# Patient Record
Sex: Male | Born: 1950 | Race: White | Hispanic: No | Marital: Married | Smoking: Former smoker
Health system: Southern US, Community
[De-identification: ages and names within clinical notes are randomized; demographics above are authoritative.]

## PROBLEM LIST (undated history)

## (undated) DIAGNOSIS — E119 Type 2 diabetes mellitus without complications: Secondary | ICD-10-CM

## (undated) DIAGNOSIS — M1990 Unspecified osteoarthritis, unspecified site: Secondary | ICD-10-CM

## (undated) DIAGNOSIS — N179 Acute kidney failure, unspecified: Secondary | ICD-10-CM

## (undated) DIAGNOSIS — I4892 Unspecified atrial flutter: Secondary | ICD-10-CM

## (undated) DIAGNOSIS — G8929 Other chronic pain: Secondary | ICD-10-CM

## (undated) DIAGNOSIS — I209 Angina pectoris, unspecified: Secondary | ICD-10-CM

## (undated) DIAGNOSIS — I82409 Acute embolism and thrombosis of unspecified deep veins of unspecified lower extremity: Secondary | ICD-10-CM

## (undated) DIAGNOSIS — E7800 Pure hypercholesterolemia, unspecified: Secondary | ICD-10-CM

## (undated) DIAGNOSIS — I219 Acute myocardial infarction, unspecified: Secondary | ICD-10-CM

## (undated) DIAGNOSIS — I2510 Atherosclerotic heart disease of native coronary artery without angina pectoris: Secondary | ICD-10-CM

## (undated) DIAGNOSIS — M545 Low back pain, unspecified: Secondary | ICD-10-CM

## (undated) DIAGNOSIS — C801 Malignant (primary) neoplasm, unspecified: Secondary | ICD-10-CM

## (undated) DIAGNOSIS — I10 Essential (primary) hypertension: Secondary | ICD-10-CM

## (undated) DIAGNOSIS — I509 Heart failure, unspecified: Secondary | ICD-10-CM

## (undated) DIAGNOSIS — I739 Peripheral vascular disease, unspecified: Secondary | ICD-10-CM

## (undated) DIAGNOSIS — D649 Anemia, unspecified: Secondary | ICD-10-CM

## (undated) DIAGNOSIS — E669 Obesity, unspecified: Secondary | ICD-10-CM

## (undated) DIAGNOSIS — R0600 Dyspnea, unspecified: Secondary | ICD-10-CM

## (undated) DIAGNOSIS — Z794 Long term (current) use of insulin: Secondary | ICD-10-CM

## (undated) DIAGNOSIS — N529 Male erectile dysfunction, unspecified: Secondary | ICD-10-CM

## (undated) DIAGNOSIS — K429 Umbilical hernia without obstruction or gangrene: Secondary | ICD-10-CM

## (undated) DIAGNOSIS — I4891 Unspecified atrial fibrillation: Secondary | ICD-10-CM

## (undated) HISTORY — DX: Heart failure, unspecified: I50.9

## (undated) HISTORY — DX: Pure hypercholesterolemia, unspecified: E78.00

## (undated) HISTORY — DX: Acute kidney failure, unspecified: N17.9

## (undated) HISTORY — DX: Acute embolism and thrombosis of unspecified deep veins of unspecified lower extremity: I82.409

## (undated) HISTORY — DX: Male erectile dysfunction, unspecified: N52.9

## (undated) HISTORY — DX: Obesity, unspecified: E66.9

## (undated) HISTORY — PX: CORONARY ARTERY BYPASS GRAFT: SHX141

## (undated) HISTORY — DX: Unspecified atrial fibrillation: I48.91

## (undated) NOTE — *Deleted (*Deleted)
Pharmacy Resident Rounding Note - for learning purposes only, not an active part of the chart  S/o   Admit Complaint: 11/5 L-sided weakness, R PCA occlusion >> revasc -intubated for procedure, extubated 11/8, following commands but hard to assess mental status    H/o gastric cancer, anemia   Anticoagulation eliquis >> hep gtt for afib; Hgb 9-10, PLT WNL Infectious Disease - COVID + w/ PNA, WBC 20>>25.6 S/p remd Cardiovascular -   amio 200 qd, amlo 10, rosuva 40, hctz 25  Endocrinology - CBGS 240-300  Lantus 22 >>25u BID>>30u BID , mSSI -on methylpred 50 >> tapering over 3 more days  -metformin 1g BID @ home  Gastrointestinal / Nutrition -protonix 40, TF, last BM 11/10 Neurology  -gaba 300 BID @ home. 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

---

## 2001-12-08 ENCOUNTER — Encounter: Payer: Self-pay | Admitting: Emergency Medicine

## 2001-12-08 ENCOUNTER — Inpatient Hospital Stay (HOSPITAL_COMMUNITY): Admission: EM | Admit: 2001-12-08 | Discharge: 2001-12-13 | Payer: Self-pay | Admitting: Emergency Medicine

## 2001-12-11 ENCOUNTER — Encounter: Payer: Self-pay | Admitting: Interventional Cardiology

## 2005-07-09 ENCOUNTER — Inpatient Hospital Stay (HOSPITAL_COMMUNITY): Admission: EM | Admit: 2005-07-09 | Discharge: 2005-07-22 | Payer: Self-pay | Admitting: Emergency Medicine

## 2005-08-07 ENCOUNTER — Ambulatory Visit: Payer: Self-pay | Admitting: Cardiology

## 2005-08-07 ENCOUNTER — Encounter: Admission: RE | Admit: 2005-08-07 | Discharge: 2005-08-07 | Payer: Self-pay | Admitting: Cardiothoracic Surgery

## 2011-08-27 ENCOUNTER — Other Ambulatory Visit: Payer: Self-pay | Admitting: Interventional Cardiology

## 2011-08-31 ENCOUNTER — Ambulatory Visit (HOSPITAL_COMMUNITY)
Admission: RE | Admit: 2011-08-31 | Discharge: 2011-09-01 | Disposition: A | Discharge status: Home / Self Care | Payer: 59 | Source: Ambulatory Visit | Attending: Interventional Cardiology | Admitting: Interventional Cardiology

## 2011-08-31 ENCOUNTER — Other Ambulatory Visit: Payer: Self-pay

## 2011-08-31 ENCOUNTER — Encounter (HOSPITAL_COMMUNITY): Payer: Self-pay | Admitting: General Practice

## 2011-08-31 ENCOUNTER — Encounter (HOSPITAL_COMMUNITY)
Admission: RE | Disposition: A | Discharge status: Home / Self Care | Payer: Self-pay | Source: Ambulatory Visit | Attending: Interventional Cardiology

## 2011-08-31 DIAGNOSIS — I25810 Atherosclerosis of coronary artery bypass graft(s) without angina pectoris: Secondary | ICD-10-CM | POA: Insufficient documentation

## 2011-08-31 DIAGNOSIS — I2582 Chronic total occlusion of coronary artery: Secondary | ICD-10-CM | POA: Insufficient documentation

## 2011-08-31 DIAGNOSIS — I10 Essential (primary) hypertension: Secondary | ICD-10-CM

## 2011-08-31 DIAGNOSIS — I209 Angina pectoris, unspecified: Secondary | ICD-10-CM | POA: Insufficient documentation

## 2011-08-31 DIAGNOSIS — E785 Hyperlipidemia, unspecified: Secondary | ICD-10-CM | POA: Insufficient documentation

## 2011-08-31 DIAGNOSIS — Z951 Presence of aortocoronary bypass graft: Secondary | ICD-10-CM | POA: Diagnosis present

## 2011-08-31 DIAGNOSIS — E119 Type 2 diabetes mellitus without complications: Secondary | ICD-10-CM

## 2011-08-31 DIAGNOSIS — I2510 Atherosclerotic heart disease of native coronary artery without angina pectoris: Secondary | ICD-10-CM | POA: Insufficient documentation

## 2011-08-31 DIAGNOSIS — R9439 Abnormal result of other cardiovascular function study: Secondary | ICD-10-CM | POA: Insufficient documentation

## 2011-08-31 HISTORY — DX: Low back pain, unspecified: M54.50

## 2011-08-31 HISTORY — DX: Type 2 diabetes mellitus without complications: E11.9

## 2011-08-31 HISTORY — DX: Umbilical hernia without obstruction or gangrene: K42.9

## 2011-08-31 HISTORY — DX: Peripheral vascular disease, unspecified: I73.9

## 2011-08-31 HISTORY — DX: Other chronic pain: G89.29

## 2011-08-31 HISTORY — PX: LEFT HEART CATHETERIZATION WITH CORONARY/GRAFT ANGIOGRAM: SHX5450

## 2011-08-31 HISTORY — DX: Low back pain: M54.5

## 2011-08-31 HISTORY — DX: Type 2 diabetes mellitus without complications: Z79.4

## 2011-08-31 HISTORY — DX: Essential (primary) hypertension: I10

## 2011-08-31 HISTORY — PX: CORONARY ANGIOPLASTY WITH STENT PLACEMENT: SHX49

## 2011-08-31 HISTORY — DX: Acute myocardial infarction, unspecified: I21.9

## 2011-08-31 HISTORY — DX: Angina pectoris, unspecified: I20.9

## 2011-08-31 LAB — POCT ACTIVATED CLOTTING TIME: Activated Clotting Time: 380 seconds

## 2011-08-31 LAB — GLUCOSE, CAPILLARY
Glucose-Capillary: 127 mg/dL — ABNORMAL HIGH (ref 70–99)
Glucose-Capillary: 105 mg/dL — ABNORMAL HIGH (ref 70–99)
Glucose-Capillary: 198 mg/dL — ABNORMAL HIGH (ref 70–99)
Glucose-Capillary: 210 mg/dL — ABNORMAL HIGH (ref 70–99)

## 2011-08-31 SURGERY — LEFT HEART CATHETERIZATION WITH CORONARY/GRAFT ANGIOGRAM
Anesthesia: LOCAL

## 2011-08-31 MED ORDER — METFORMIN HCL ER 750 MG PO TB24
2000.0000 mg | ORAL_TABLET | Freq: Every day | ORAL | Status: DC
  Filled 2011-08-31: qty 1

## 2011-08-31 MED ORDER — DIAZEPAM 5 MG PO TABS
ORAL_TABLET | ORAL | Status: AC
  Filled 2011-08-31: qty 1

## 2011-08-31 MED ORDER — ACETAMINOPHEN 325 MG PO TABS: 650.0000 mg | ORAL_TABLET | ORAL | Status: DC | PRN

## 2011-08-31 MED ORDER — SIMVASTATIN 40 MG PO TABS
40.0000 mg | ORAL_TABLET | Freq: Every day | ORAL | Status: DC
  Administered 2011-08-31: 40 mg via ORAL
  Filled 2011-08-31 (×2): qty 1

## 2011-08-31 MED ORDER — SODIUM CHLORIDE 0.9 % IJ SOLN
3.0000 mL | Freq: Two times a day (BID) | INTRAMUSCULAR | Status: DC
  Administered 2011-08-31: 3 mL via INTRAVENOUS

## 2011-08-31 MED ORDER — DIAZEPAM 5 MG PO TABS
5.0000 mg | ORAL_TABLET | ORAL | Status: AC
  Administered 2011-08-31: 5 mg via ORAL

## 2011-08-31 MED ORDER — SODIUM CHLORIDE 0.9 % IV SOLN: 250.0000 mL | INTRAVENOUS | Status: DC | PRN

## 2011-08-31 MED ORDER — ASPIRIN 81 MG PO CHEW
324.0000 mg | CHEWABLE_TABLET | ORAL | Status: AC
  Administered 2011-08-31: 324 mg via ORAL

## 2011-08-31 MED ORDER — INSULIN ASPART 100 UNIT/ML ~~LOC~~ SOLN
0.0000 [IU] | Freq: Every day | SUBCUTANEOUS | Status: DC
  Administered 2011-08-31: 2 [IU] via SUBCUTANEOUS

## 2011-08-31 MED ORDER — INSULIN GLARGINE 100 UNIT/ML ~~LOC~~ SOLN
60.0000 [IU] | Freq: Every day | SUBCUTANEOUS | Status: DC
  Administered 2011-09-01: 60 [IU] via SUBCUTANEOUS

## 2011-08-31 MED ORDER — PRASUGREL HCL 10 MG PO TABS
10.0000 mg | ORAL_TABLET | Freq: Every day | ORAL | Status: DC
  Administered 2011-09-01: 10 mg via ORAL
  Filled 2011-08-31: qty 1

## 2011-08-31 MED ORDER — CARVEDILOL 6.25 MG PO TABS
6.2500 mg | ORAL_TABLET | Freq: Two times a day (BID) | ORAL | Status: DC
  Administered 2011-08-31 – 2011-09-01 (×2): 6.25 mg via ORAL
  Filled 2011-08-31 (×4): qty 1

## 2011-08-31 MED ORDER — PRASUGREL HCL 10 MG PO TABS
ORAL_TABLET | ORAL | Status: AC
  Filled 2011-08-31: qty 6

## 2011-08-31 MED ORDER — ASPIRIN EC 325 MG PO TBEC
325.0000 mg | DELAYED_RELEASE_TABLET | Freq: Every day | ORAL | Status: DC
  Administered 2011-09-01: 325 mg via ORAL
  Filled 2011-08-31: qty 1

## 2011-08-31 MED ORDER — DIAZEPAM 5 MG PO TABS: 5.0000 mg | ORAL_TABLET | Freq: Three times a day (TID) | ORAL | Status: DC | PRN

## 2011-08-31 MED ORDER — INSULIN ASPART 100 UNIT/ML ~~LOC~~ SOLN
0.0000 [IU] | Freq: Three times a day (TID) | SUBCUTANEOUS | Status: DC
  Administered 2011-09-01: 5 [IU] via SUBCUTANEOUS

## 2011-08-31 MED ORDER — ONDANSETRON HCL 4 MG/2ML IJ SOLN: 4.0000 mg | Freq: Four times a day (QID) | INTRAMUSCULAR | Status: DC | PRN

## 2011-08-31 MED ORDER — FENTANYL CITRATE 0.05 MG/ML IJ SOLN
INTRAMUSCULAR | Status: AC
  Filled 2011-08-31: qty 2

## 2011-08-31 MED ORDER — SODIUM CHLORIDE 0.9 % IJ SOLN: 3.0000 mL | INTRAMUSCULAR | Status: DC | PRN

## 2011-08-31 MED ORDER — SODIUM CHLORIDE 0.9 % IV SOLN
INTRAVENOUS | Status: DC
  Administered 2011-08-31: 09:00:00 via INTRAVENOUS

## 2011-08-31 MED ORDER — MIDAZOLAM HCL 2 MG/2ML IJ SOLN
INTRAMUSCULAR | Status: AC
  Filled 2011-08-31: qty 2

## 2011-08-31 MED ORDER — OXYCODONE-ACETAMINOPHEN 5-325 MG PO TABS: 1.0000 | ORAL_TABLET | ORAL | Status: DC | PRN

## 2011-08-31 MED ORDER — SODIUM CHLORIDE 0.9 % IV SOLN
INTRAVENOUS | Status: AC
  Administered 2011-08-31: 15:00:00 via INTRAVENOUS

## 2011-08-31 MED ORDER — ZOLPIDEM TARTRATE 5 MG PO TABS: 10.0000 mg | ORAL_TABLET | Freq: Every evening | ORAL | Status: DC | PRN

## 2011-08-31 MED ORDER — BIVALIRUDIN 250 MG IV SOLR
INTRAVENOUS | Status: AC
  Filled 2011-08-31: qty 250

## 2011-08-31 MED ORDER — INSULIN ASPART 100 UNIT/ML ~~LOC~~ SOLN: 0.0000 [IU] | Freq: Every day | SUBCUTANEOUS | Status: DC

## 2011-08-31 MED ORDER — ASPIRIN 81 MG PO CHEW
CHEWABLE_TABLET | ORAL | Status: AC
  Filled 2011-08-31: qty 4

## 2011-08-31 NOTE — H&P (Signed)
Please see our dictated note from the office. He has continued to have angina since his markedly abnormal nuclear study performed last week. He had a large region of the inferior and lateral ischemia. He is consented to undergo coronary angiography to define coronary anatomy and help guide therapy. He has undergone coronary bypass grafting on 2 prior occasions. Initially in 2003 when he had a RIMA to the right coronary, SVG to the PDA, SVG to the diagonal, and SVG to diagonal 2. A second operation was performed in 2007 when he received a LIMA to the LAD and SVG to RCA. He understands procedure and accepts the risk of stroke, heart attack, MI, bleeding, infection, death, etc., and is willing to proceed.

## 2011-08-31 NOTE — CV Procedure (Signed)
Diagnostic Cardiac Catheterization Report  Justin Blackburn  61 y.o.  male Oct 25, 1950  Procedure Date: 08/31/2011  Referring Physician: Lavone Orn, M.D. Primary Cardiologist:: 8. Laurice Record, M.D.   PROCEDURE:  Left heart catheterization with selective coronary angiography, left ventriculogram, left internal mammary artery angiography, right internal mammary artery angiography, saphenous vein graft angiography, and PCI with DES in the mid obtuse marginal 1.  INDICATIONS:  Progressive angina over 2-6 months. Markedly abnormal nuclear perfusion study with inferior and lateral wall ischemia. The patient has prior history of coronary bypass grafting on 2 prior occasions. He most recently had an LIMA to the LAD and saphenous vein graft to the RCA in 2007. He has a persistently patent RIMA to the RCA from 2003. He is also had a saphenous vein graft to the first and second diagonals and prior obtuse marginal #1 stenting in 2004.  The risks, benefits, and details of the procedure were explained to the patient.  The patient verbalized understanding and wanted to proceed.  Informed written consent was obtained.  PROCEDURE TECHNIQUE:  After Xylocaine anesthesia a 5 French sheath was placed in the right femoral artery with a single anterior needle wall stick.   Coronary angiography was done using a 5 [place] A-2 MP and a 5 [place] internal mammary artery catheter.  Left ventriculography was done using a 5 [place] A-2 MP catheter.   After reviewing the digital images the territory most responsible for the patient's nuclear abnormalities was a circumflex territory. There is an eccentric 75-90% stenosis in the mid to distal first obtuse marginal beyond which this vessel collateralizes the first diagonal branch and the second obtuse marginal. We felt this was the likely source of the patient's significant lateral wall ischemia.  A 6 French CLS 3.5 cm guide catheter was then used after exchanging the 5  [place] sheath with a 6 [place] sheath. He was loaded with 60 mg of Effient, and an Angiomax bolus and infusion was started. The ACT was documented greater than 200. PCI of the first obtuse marginal was performed after predilatation with a 6 mm long by 2.75 mm cutting balloon. We then positioned and deployed a 27 5 x 12 mm Resolute DES and postdilated with a 3.0 x 8 mm [place] balloon to 13 atmospheres. No complications occurred.   CONTRAST:  Total of 200 cc cc.  COMPLICATIONS:  None.    HEMODYNAMICS:  Aortic pressure was 131/89 mmHg; LV pressure was 140/11 mmHg; LVEDP 17 mmHg.  There was no gradient between the left ventricle and aorta.    ANGIOGRAPHIC DATA:   The left main coronary artery is widely patent.  The left anterior descending artery is totally occluded proximally. Heart collaterals fill a diagonal branch..  The left circumflex artery is totally occluded after the large first obtuse marginal this marginal is large and in its distal third contains an eccentric 80% stenosis. The distal vessel supplies collaterals to the diagonal and to the second obtuse marginal..  The right coronary artery is totally occluded in the aorto ostial junction.  LEFT VENTRICULOGRAM:  Left ventricular angiogram was done in the 30 RAO projection and revealed decreased left ventricular wall systolic function with an estimated ejection fraction of 40% %. The inferior wall is hypokinetic.    BYPASS GRAFT ANGIOGRAPHY:  Left internal mammary graft to the LAD is widely patent  Right internal mammary graft to the distal RCA is widely patent (never selectively engaged)  Saphenous vein graft to RCA,  totally occluded  Saphenous vein grafts to the diagonals from previous surgery, totally occluded  PCI RESULTS:  The 80 percent eccentric stenosis in the distal obtuse marginal #1 was reduced to 0% with TIMI grade 3 flow after stenting.  IMPRESSIONS:  1. Advanced coronary atherosclerotic disease with total occlusion of  the LAD and native right coronary. Total occlusion of the circumflex after the large first obtuse marginal. The obtuse marginal #1 contains an eccentric 80-90% stenosis.  2. Successful stenting of the obtuse marginal to 0% with TIMI grade 3 flow.  3. Left ventricular dysfunction, with LVEF 40%, and inferior wall motion abnormality.  4. Widely patent right internal mammary and left internal mammary graft to the right coronary and LAD respectively.  5. Total occlusion of all previously placed saphenous vein grafts   RECOMMENDATION:  1. Aspirin and Effient, for least 6 months and preferably one year  2. Possible discharge in the a.m.[name]

## 2011-09-01 ENCOUNTER — Other Ambulatory Visit: Payer: Self-pay

## 2011-09-01 DIAGNOSIS — Z951 Presence of aortocoronary bypass graft: Secondary | ICD-10-CM | POA: Diagnosis present

## 2011-09-01 DIAGNOSIS — E119 Type 2 diabetes mellitus without complications: Secondary | ICD-10-CM

## 2011-09-01 DIAGNOSIS — I10 Essential (primary) hypertension: Secondary | ICD-10-CM

## 2011-09-01 LAB — BASIC METABOLIC PANEL
Chloride: 105 mEq/L (ref 96–112)
GFR calc Af Amer: >90 mL/min (ref >90)
GFR calc non Af Amer: >90 mL/min (ref >90)
Potassium: 3.5 mEq/L (ref 3.5–5.1)
Sodium: 138 mEq/L (ref 135–145)

## 2011-09-01 LAB — CBC
Platelets: 231 10*3/uL (ref 150–400)
RDW: 15.3 % (ref 11.5–15.5)
WBC: 8.5 10*3/uL (ref 4.0–10.5)

## 2011-09-01 LAB — GLUCOSE, CAPILLARY: Glucose-Capillary: 216 mg/dL — ABNORMAL HIGH (ref 70–99)

## 2011-09-01 MED ORDER — NITROGLYCERIN 0.4 MG SL SUBL: 0.4000 mg | SUBLINGUAL_TABLET | SUBLINGUAL | Status: DC | PRN

## 2011-09-01 MED ORDER — PRASUGREL HCL 10 MG PO TABS: 10.0000 mg | ORAL_TABLET | Freq: Every day | ORAL | Status: DC

## 2011-09-01 MED ORDER — METFORMIN HCL ER 500 MG PO TB24: 2000.0000 mg | ORAL_TABLET | Freq: Every day | ORAL | Status: DC

## 2011-09-01 MED ORDER — ASPIRIN 325 MG PO TBEC: 325.0000 mg | DELAYED_RELEASE_TABLET | Freq: Every day | ORAL | Status: AC

## 2011-09-01 MED FILL — Dextrose Inj 5%: INTRAVENOUS | Qty: 50 | Status: AC

## 2011-09-01 NOTE — Discharge Instructions (Signed)
Resume all other home meds as before if not listed, especially diuretic and potassium suppliment. Call if chest pain

## 2011-09-01 NOTE — Discharge Summary (Signed)
Patient ID: Justin Blackburn MRN: [id number] DOB/AGE: 1950-12-25 61 y.o.  Admit date: 08/31/2011 Discharge date: 09/01/2011  Primary Discharge Diagnosis : Class III angina pectoris Secondary Discharge Diagnosis: 1. Coronary atherosclerosis with coronary bypass surgery 2003 and 2007.  2. Diabetes mellitus, poorly controlled  3. Hypertension  4. Hyperlipidemia  Significant Diagnostic Studies: Coronary angiography, bypass graft angiography, and drug-eluting stent implantation native obtuse marginal  Consults: None  Hospital Course: As an outpatient, the patient had a markedly abnormal nuclear study showing significant lateral wall ischemia. There is a several week to several month history of exertional dyspnea and severe chest tightness. Catheterization was performed to identify graft patency and native vessel disease.  The LIMA to the LAD, the RIMA to the RCA, and the previously stented obtuse marginal #1 were patent, however the obtuse marginal contained a new eccentric 80% stenosis in the mid to distal vessel. DES was implanted in the first marginal without complications.  The patient was able to ambulate the following morning without complications.  The medication regimen listed by the pharmacy did not include all of his medications. He is on a diuretic and aspirin usual, the patient does not remember his doses. He is relatively noncompliant.   Discharge Exam: Blood pressure 156/87, pulse 75, temperature 98.2 F (36.8 C), temperature source Oral, resp. rate 17, height 6' (1.829 m), weight 109.4 kg (241 lb 2.9 oz), SpO2 96.00%.   The right groin cath site is unremarkable.  The lungs are clear.  The cardiac exam is unremarkable. Labs:   Lab Results  Component Value Date   WBC 8.5 09/01/2011   HGB 14.3 09/01/2011   HCT 42.0 09/01/2011   MCV 78.8 09/01/2011   PLT 231 09/01/2011    Lab 09/01/11 0420  NA 138  K 3.5  CL 105  CO2 26  BUN 13  CREATININE 0.83  CALCIUM 8.9    PROT --  BILITOT --  ALKPHOS --  ALT --  AST --  GLUCOSE 189*      Radiology: A right lung nodule is noted on chest x-ray felt possibly to represent a nipple shadow. This will be followed up as an outpatient.  EKG: Normal sinus rhythm with LVH and evidence of prior IMI  FOLLOW UP PLANS AND APPOINTMENTS  Medication List  As of 09/01/2011  9:32 AM   TAKE these medications         aspirin 325 MG EC tablet   Take 1 tablet (325 mg total) by mouth daily.      carvedilol 6.25 MG tablet   Commonly known as: COREG   Take 6.25 mg by mouth 2 (two) times daily with a meal.      insulin glargine 100 UNIT/ML injection   Commonly known as: LANTUS   Inject 60 Units into the skin daily.      metFORMIN 500 MG 24 hr tablet   Commonly known as: GLUCOPHAGE-XR   Take 4 tablets (2,000 mg total) by mouth daily with breakfast.   Start taking on: 09/02/2011      prasugrel 10 MG Tabs   Commonly known as: EFFIENT   Take 1 tablet (10 mg total) by mouth daily.      pravastatin 40 MG tablet   Commonly known as: PRAVACHOL   Take 40 mg by mouth at bedtime.           Follow-up Information    Follow up with [name], MD on 09/08/2011. (9:50 A Bennye Alm)  Contact information:   [facility] [place] [phone]          BRING ALL MEDICATIONS WITH YOU TO FOLLOW UP APPOINTMENTS  Time spent with patient to include physician time: 30 min Signed: [name], [name] 09/01/2011, 9:32 AM

## 2011-09-01 NOTE — Progress Notes (Signed)
Contacted Dr. Colon Flattery regarding pt CBG of 210.  Put pt on SSI AC/HS.

## 2011-09-01 NOTE — Progress Notes (Signed)
   CARE MANAGEMENT NOTE 09/01/2011  Patient:  RAYME, STETZ   Account Number:  [id number]  Date Initiated:  09/01/2011  Documentation initiated by:  GRAVES-BIGELOW,Nastacia Raybuck  Subjective/Objective Assessment:   Pt admitted with abnormal stress test, cp and s/p pci. Plan for home on effient. CM will provide pt with 30 day free card.     Action/Plan:   Benefits check in process. Will make pt aware when complete. CM did call [facility] [address] in [place] and they do have medication available. MD please write rx for 30 day free no refills and then the original rx with refills. Co pay cost  78.00.   Anticipated DC Date:  09/01/2011   Anticipated DC Plan:  [place]  CM consult      Choice offered to / List presented to:             Status of service:  Completed, signed off Medicare Important Message given?   (If response is "NO", the following Medicare IM given date fields will be blank) Date Medicare IM given:   Date Additional Medicare IM given:    Discharge Disposition:  HOME/SELF CARE  Per UR Regulation:    If discussed at Long Length of Stay Meetings, dates discussed:    Comments:

## 2011-09-01 NOTE — Progress Notes (Signed)
[id number] Cardiac Rehab Pt states that he has walked in hall, denies any cp or SOB. Completed discharge education with pt and wife. He declines Outpt. CRP, he is not interested.

## 2013-03-30 ENCOUNTER — Encounter: Payer: Self-pay | Admitting: *Deleted

## 2013-03-30 ENCOUNTER — Encounter: Payer: Self-pay | Admitting: Interventional Cardiology

## 2013-03-30 DIAGNOSIS — I739 Peripheral vascular disease, unspecified: Secondary | ICD-10-CM | POA: Insufficient documentation

## 2013-03-30 DIAGNOSIS — K429 Umbilical hernia without obstruction or gangrene: Secondary | ICD-10-CM | POA: Insufficient documentation

## 2013-03-30 DIAGNOSIS — I214 Non-ST elevation (NSTEMI) myocardial infarction: Secondary | ICD-10-CM | POA: Insufficient documentation

## 2013-03-30 DIAGNOSIS — E7800 Pure hypercholesterolemia, unspecified: Secondary | ICD-10-CM | POA: Insufficient documentation

## 2013-04-03 ENCOUNTER — Ambulatory Visit: Payer: 59 | Admitting: Interventional Cardiology

## 2013-04-17 ENCOUNTER — Ambulatory Visit (INDEPENDENT_AMBULATORY_CARE_PROVIDER_SITE_OTHER): Payer: 59 | Admitting: Interventional Cardiology

## 2013-04-17 ENCOUNTER — Encounter: Payer: Self-pay | Admitting: Interventional Cardiology

## 2013-04-17 VITALS — BP 136/90 | HR 70 | Ht 72.0 in | Wt 238.0 lb

## 2013-04-17 DIAGNOSIS — E7800 Pure hypercholesterolemia, unspecified: Secondary | ICD-10-CM

## 2013-04-17 DIAGNOSIS — I10 Essential (primary) hypertension: Secondary | ICD-10-CM

## 2013-04-17 DIAGNOSIS — I5041 Acute combined systolic (congestive) and diastolic (congestive) heart failure: Secondary | ICD-10-CM | POA: Insufficient documentation

## 2013-04-17 DIAGNOSIS — I5042 Chronic combined systolic (congestive) and diastolic (congestive) heart failure: Secondary | ICD-10-CM | POA: Insufficient documentation

## 2013-04-17 DIAGNOSIS — I25810 Atherosclerosis of coronary artery bypass graft(s) without angina pectoris: Secondary | ICD-10-CM

## 2013-04-17 NOTE — Patient Instructions (Signed)
Your physician recommends that you continue on your current medications as directed. Please refer to the Current Medication list given to you today.  Your physician wants you to follow-up in: 1 year You will receive a reminder letter in the mail two months in advance. If you don't receive a letter, please call our office to schedule the follow-up appointment.  Continue to stay active and follow a low fat diet

## 2013-04-17 NOTE — Progress Notes (Signed)
Patient ID: Justin Blackburn, male   DOB: 03-21-1951, 62 y.o.   MRN: [id number]    [address], [address] [place], [place]  [address] Phone: [phone] Fax:  [phone]  Date:  04/17/2013   ID:  TJ VEGTER, DOB 25-May-1951, MRN [id number]  PCP:  No primary provider on file.   ASSESSMENT:  1. Coronary atherosclerosis, asymptomatic 2. Hypertension, excellent control 3. Hyperlipidemia 4. Combined systolic and diastolic heart failure, stable  PLAN:  1. Low-salt diet 2. Active lifestyle 3. Continue current medical regimen 4. Return in one year for clinical followup   SUBJECTIVE: Justin Blackburn is a 62 y.o. male who is doing well. He has not had angina. He denies nitroglycerin use. No medication side effects. He advocates compliance with his current medical regimen. No palpitations or syncope. No peripheral edema. He denies transient neurological symptoms   Wt Readings from Last 3 Encounters:  04/17/13 238 lb (107.956 kg)  09/01/11 241 lb 2.9 oz (109.4 kg)  09/01/11 241 lb 2.9 oz (109.4 kg)     Past Medical History  Diagnosis Date  . Umbilical hernia     unrepaired  . Myocardial infarction 1988; ~1992;  ~1996;~ 2000  . Angina   . Hypertension   . High cholesterol   . 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 Peripheral vascular disease   . Chronic lower back pain     "all the time from my sciatic nerve"  . Erectile dysfunction   . Hypercholesteremia   . Heart failure   . Obesity     Current Outpatient Prescriptions  Medication Sig Dispense Refill  . aspirin 81 MG EC tablet Take 81 mg by mouth daily. Swallow whole.      . carvedilol (COREG) 6.25 MG tablet Take 6.25 mg by mouth 2 (two) times daily with a meal.      . clopidogrel (PLAVIX) 75 MG tablet Take 75 mg by mouth daily.       . furosemide (LASIX) 40 MG tablet Take 40 mg by mouth as needed.      . insulin glargine (LANTUS) 100 UNIT/ML injection Inject 60 Units into the skin daily.      .  metFORMIN (GLUCOPHAGE-XR) 500 MG 24 hr tablet Take 4 tablets (2,000 mg total) by mouth daily with breakfast.      . Olmesartan-Amlodipine-HCTZ (TRIBENZOR) 40-5-12.5 MG TABS Take by mouth daily.      . prasugrel (EFFIENT) 10 MG TABS Take 1 tablet (10 mg total) by mouth daily.  30 tablet  11  . pravastatin (PRAVACHOL) 40 MG tablet Take 40 mg by mouth at bedtime.      . nitroGLYCERIN (NITROSTAT) 0.4 MG SL tablet Place 1 tablet (0.4 mg total) under the tongue every 5 (five) minutes as needed for chest pain (Angina).  25 tablet  3   No current facility-administered medications for this visit.    Allergies:   No Known Allergies  Social History:  The patient  reports that he quit smoking about 41 years ago. His smoking use included Cigarettes. He has a 12 pack-year smoking history. He quit smokeless tobacco use about 26 years ago. He reports that he does not drink alcohol or use illicit drugs.   ROS:  Please see the history of present illness.      All other systems reviewed and negative.   OBJECTIVE: VS:  BP 136/90  Pulse 70  Ht 6' (1.829 m)  Wt 238 lb (  107.956 kg)  BMI 32.27 kg/m2 Well nourished, well developed, in no acute distress, obese HEENT: normal Neck: JVD none. Carotid bruit absent  Cardiac:  normal S1, S2; RRR; no murmur. An S4 gallop is audible Lungs:  clear to auscultation bilaterally, no wheezing, rhonchi or rales Abd: soft, nontender, no hepatomegaly Ext: Edema trace bilateral ankle edema. Pulses 2+ bilateral except left radial which is absent Skin: warm and dry Neuro:  CNs 2-12 intact, no focal abnormalities noted  EKG:  Anteroseptal Q waves, evidence also of an old inferior infarct, right axis deviation, right bundle branch block.       Signed, Illene Labrador III, MD 04/17/2013 11:09 AM

## 2013-07-11 ENCOUNTER — Other Ambulatory Visit: Payer: Self-pay

## 2013-07-11 MED ORDER — FUROSEMIDE 40 MG PO TABS: 40.0000 mg | ORAL_TABLET | ORAL | Status: DC | PRN

## 2014-01-20 ENCOUNTER — Encounter: Payer: Self-pay | Admitting: Interventional Cardiology

## 2014-05-24 ENCOUNTER — Encounter (HOSPITAL_COMMUNITY): Payer: Self-pay | Admitting: Interventional Cardiology

## 2014-06-18 ENCOUNTER — Encounter: Payer: Self-pay | Admitting: Interventional Cardiology

## 2014-06-18 ENCOUNTER — Ambulatory Visit (INDEPENDENT_AMBULATORY_CARE_PROVIDER_SITE_OTHER): Payer: PPO | Admitting: Interventional Cardiology

## 2014-06-18 VITALS — BP 140/90 | HR 66 | Ht 72.0 in | Wt 242.0 lb

## 2014-06-18 DIAGNOSIS — I10 Essential (primary) hypertension: Secondary | ICD-10-CM

## 2014-06-18 DIAGNOSIS — I5042 Chronic combined systolic (congestive) and diastolic (congestive) heart failure: Secondary | ICD-10-CM

## 2014-06-18 DIAGNOSIS — I25719 Atherosclerosis of autologous vein coronary artery bypass graft(s) with unspecified angina pectoris: Secondary | ICD-10-CM

## 2014-06-18 DIAGNOSIS — E7800 Pure hypercholesterolemia, unspecified: Secondary | ICD-10-CM

## 2014-06-18 MED ORDER — OLMESARTAN-AMLODIPINE-HCTZ 40-5-12.5 MG PO TABS: 1.0000 | ORAL_TABLET | Freq: Every day | ORAL | Status: DC

## 2014-06-18 MED ORDER — NITROGLYCERIN 0.4 MG SL SUBL: 0.4000 mg | SUBLINGUAL_TABLET | SUBLINGUAL | Status: DC | PRN

## 2014-06-18 NOTE — Patient Instructions (Signed)
Your physician recommends that you continue on your current medications as directed. Please refer to the Current Medication list given to you today.  Your physician wants you to follow-up in: 6-12 months with Dr.Smith You will receive a reminder letter in the mail two months in advance. If you don't receive a letter, please call our office to schedule the follow-up appointment.

## 2014-06-18 NOTE — Progress Notes (Signed)
Patient ID: Justin Blackburn, male   DOB: September 28, 1950, 64 y.o.   MRN: [id number]    [address] [place], [place]  [address] Phone: [phone] Fax:  [phone]  Date:  06/18/2014   ID:  Justin Blackburn, DOB Sep 29, 1950, MRN [id number]  PCP:  Justin Sake, MD   ASSESSMENT:  1. Coronary artery disease with prior bypass grafting and no symptoms of angina 2. Chronic combined systolic and diastolic heart failure, functional class II 3. Essential hypertension 4. Hyperlipidemia followed by primary care  PLAN:  1. Refill Tribenzor and nitroglycerin 2. Encouraged aerobic activity 3. Clinical follow-up in 6-12 months. 4. We'll send copies of the current office note study Dr. Lisbeth Ply who is now his new primary care    SUBJECTIVE: Justin Blackburn is a 64 y.o. male who is doing well. He stopped taking Tribenzor late 2015 because of the donut hole. He noticed no particular heel of fax. He noticed that more furosemide was needed when troponins were was stopped. He denies angina. He denies orthopnea. He is troubled by numbness and pain in his feet.   Wt Readings from Last 3 Encounters:  06/18/14 242 lb (109.77 kg)  04/17/13 238 lb (107.956 kg)  09/01/11 241 lb 2.9 oz (109.4 kg)     Past Medical History  Diagnosis Date  . Umbilical hernia     unrepaired  . Myocardial infarction 1988; ~1992;  ~1996;~ 2000  . Angina   . Hypertension   . High cholesterol   . 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 Peripheral vascular disease   . Chronic lower back pain     "all the time from my sciatic nerve"  . Erectile dysfunction   . Hypercholesteremia   . Heart failure   . Obesity     Current Outpatient Prescriptions  Medication Sig Dispense Refill  . aspirin 81 MG EC tablet Take 81 mg by mouth daily. Swallow whole.    . carvedilol (COREG) 6.25 MG tablet Take 6.25 mg by mouth 2 (two) times daily with a meal.    . clopidogrel (PLAVIX) 75 MG tablet Take 75 mg by  mouth daily.     . furosemide (LASIX) 40 MG tablet Take 1 tablet (40 mg total) by mouth as needed. 30 tablet 6  . insulin glargine (LANTUS) 100 UNIT/ML injection Inject 60 Units into the skin daily.    . metFORMIN (GLUCOPHAGE) 1000 MG tablet Take 1,000 mg by mouth 2 (two) times daily.  0  . nitroGLYCERIN (NITROSTAT) 0.4 MG SL tablet Place 1 tablet (0.4 mg total) under the tongue every 5 (five) minutes as needed for chest pain (Angina). 25 tablet 3  . Olmesartan-Amlodipine-HCTZ (TRIBENZOR) 40-5-12.5 MG TABS Take by mouth daily.    . pravastatin (PRAVACHOL) 40 MG tablet Take 40 mg by mouth at bedtime.     No current facility-administered medications for this visit.    Allergies:   No Known Allergies  Social History:  The patient  reports that he quit smoking about 43 years ago. His smoking use included Cigarettes. He has a 12 pack-year smoking history. He quit smokeless tobacco use about 28 years ago. He reports that he does not drink alcohol or use illicit drugs.   ROS:  Please see the history of present illness.   Admits to relative sedentary lifestyle. Denies syncope and palpitations.   All other systems reviewed and negative.   OBJECTIVE: VS:  BP 140/90 mmHg  Pulse  66  Ht 6' (1.829 m)  Wt 242 lb (109.77 kg)  BMI 32.81 kg/m2 Well nourished, well developed, in no acute distress, obese HEENT: normal Neck: JVD flat. Carotid bruit absent  Cardiac:  normal S1, S2; RRR; no murmur Lungs:  clear to auscultation bilaterally, no wheezing, rhonchi or rales Abd: soft, nontender, no hepatomegaly Ext: Edema absent. Pulses 2+ Skin: warm and dry Neuro:  CNs 2-12 intact, no focal abnormalities noted  EKG:  Normal sinus rhythm with atrial and ventricular premature complexes, old inferior infarct, anterior infarct, old.       Signed, Illene Labrador III, MD 06/18/2014 9:10 AM

## 2015-01-25 ENCOUNTER — Telehealth: Payer: Self-pay | Admitting: Interventional Cardiology

## 2015-01-25 NOTE — Telephone Encounter (Signed)
Patient having increased shortness of breath with exertion over the last week or so No increased swelling but did start his Lasix daily, no improvement  Discussed with Dr Tamala Julian and scheduled ov with Edsel Petrin PA Monday  Advised patient to take it easy over weekend and do not exert self, if worse go to ED  Patient agreeable to plan

## 2015-01-25 NOTE — Telephone Encounter (Signed)
New message    Pt c/o Shortness Of Breath: STAT if SOB developed within the last 24 hours or pt is noticeably SOB on the phone  1. Are you currently SOB (can you hear that pt is SOB on the phone)? Pt wife states he is having SOB  2. How long have you been experiencing SOB? Couple weeks  3. Are you SOB when sitting or when up moving around? Both  4. Are you currently experiencing any other symptoms? Pt wife is not sure

## 2015-01-28 ENCOUNTER — Encounter: Payer: Self-pay | Admitting: Physician Assistant

## 2015-01-28 ENCOUNTER — Ambulatory Visit (INDEPENDENT_AMBULATORY_CARE_PROVIDER_SITE_OTHER): Payer: PPO | Admitting: Physician Assistant

## 2015-01-28 ENCOUNTER — Inpatient Hospital Stay (HOSPITAL_COMMUNITY)
Admission: AD | Admit: 2015-01-28 | Discharge: 2015-01-30 | DRG: 308 | Disposition: A | Discharge status: Home / Self Care | Payer: PPO | Source: Ambulatory Visit | Attending: Interventional Cardiology | Admitting: Interventional Cardiology

## 2015-01-28 VITALS — BP 120/82 | HR 110 | Ht 70.0 in | Wt 239.4 lb

## 2015-01-28 DIAGNOSIS — E1151 Type 2 diabetes mellitus with diabetic peripheral angiopathy without gangrene: Secondary | ICD-10-CM | POA: Diagnosis present

## 2015-01-28 DIAGNOSIS — Z6834 Body mass index (BMI) 34.0-34.9, adult: Secondary | ICD-10-CM | POA: Diagnosis not present

## 2015-01-28 DIAGNOSIS — E7800 Pure hypercholesterolemia, unspecified: Secondary | ICD-10-CM | POA: Diagnosis present

## 2015-01-28 DIAGNOSIS — E785 Hyperlipidemia, unspecified: Secondary | ICD-10-CM

## 2015-01-28 DIAGNOSIS — M5440 Lumbago with sciatica, unspecified side: Secondary | ICD-10-CM | POA: Diagnosis present

## 2015-01-28 DIAGNOSIS — Z79899 Other long term (current) drug therapy: Secondary | ICD-10-CM | POA: Diagnosis not present

## 2015-01-28 DIAGNOSIS — I4892 Unspecified atrial flutter: Secondary | ICD-10-CM

## 2015-01-28 DIAGNOSIS — I10 Essential (primary) hypertension: Secondary | ICD-10-CM | POA: Diagnosis present

## 2015-01-28 DIAGNOSIS — R0609 Other forms of dyspnea: Secondary | ICD-10-CM

## 2015-01-28 DIAGNOSIS — E669 Obesity, unspecified: Secondary | ICD-10-CM | POA: Diagnosis present

## 2015-01-28 DIAGNOSIS — R072 Precordial pain: Secondary | ICD-10-CM

## 2015-01-28 DIAGNOSIS — Z951 Presence of aortocoronary bypass graft: Secondary | ICD-10-CM | POA: Diagnosis present

## 2015-01-28 DIAGNOSIS — I25708 Atherosclerosis of coronary artery bypass graft(s), unspecified, with other forms of angina pectoris: Secondary | ICD-10-CM | POA: Diagnosis not present

## 2015-01-28 DIAGNOSIS — I739 Peripheral vascular disease, unspecified: Secondary | ICD-10-CM | POA: Diagnosis present

## 2015-01-28 DIAGNOSIS — Z955 Presence of coronary angioplasty implant and graft: Secondary | ICD-10-CM | POA: Diagnosis not present

## 2015-01-28 DIAGNOSIS — Z794 Long term (current) use of insulin: Secondary | ICD-10-CM

## 2015-01-28 DIAGNOSIS — I509 Heart failure, unspecified: Secondary | ICD-10-CM

## 2015-01-28 DIAGNOSIS — N529 Male erectile dysfunction, unspecified: Secondary | ICD-10-CM | POA: Diagnosis present

## 2015-01-28 DIAGNOSIS — I480 Paroxysmal atrial fibrillation: Secondary | ICD-10-CM | POA: Diagnosis not present

## 2015-01-28 DIAGNOSIS — I5043 Acute on chronic combined systolic (congestive) and diastolic (congestive) heart failure: Secondary | ICD-10-CM

## 2015-01-28 DIAGNOSIS — Z8249 Family history of ischemic heart disease and other diseases of the circulatory system: Secondary | ICD-10-CM | POA: Diagnosis not present

## 2015-01-28 DIAGNOSIS — K429 Umbilical hernia without obstruction or gangrene: Secondary | ICD-10-CM | POA: Diagnosis present

## 2015-01-28 DIAGNOSIS — I2489 Other forms of acute ischemic heart disease: Secondary | ICD-10-CM

## 2015-01-28 DIAGNOSIS — Z7902 Long term (current) use of antithrombotics/antiplatelets: Secondary | ICD-10-CM | POA: Diagnosis not present

## 2015-01-28 DIAGNOSIS — I5023 Acute on chronic systolic (congestive) heart failure: Secondary | ICD-10-CM | POA: Diagnosis not present

## 2015-01-28 DIAGNOSIS — E119 Type 2 diabetes mellitus without complications: Secondary | ICD-10-CM | POA: Diagnosis not present

## 2015-01-28 DIAGNOSIS — I25810 Atherosclerosis of coronary artery bypass graft(s) without angina pectoris: Secondary | ICD-10-CM | POA: Diagnosis present

## 2015-01-28 DIAGNOSIS — I248 Other forms of acute ischemic heart disease: Secondary | ICD-10-CM | POA: Diagnosis present

## 2015-01-28 DIAGNOSIS — I252 Old myocardial infarction: Secondary | ICD-10-CM | POA: Diagnosis not present

## 2015-01-28 DIAGNOSIS — G8929 Other chronic pain: Secondary | ICD-10-CM | POA: Diagnosis present

## 2015-01-28 DIAGNOSIS — Z7901 Long term (current) use of anticoagulants: Secondary | ICD-10-CM

## 2015-01-28 DIAGNOSIS — R7989 Other specified abnormal findings of blood chemistry: Secondary | ICD-10-CM | POA: Diagnosis not present

## 2015-01-28 DIAGNOSIS — Z87891 Personal history of nicotine dependence: Secondary | ICD-10-CM | POA: Diagnosis not present

## 2015-01-28 DIAGNOSIS — Z7982 Long term (current) use of aspirin: Secondary | ICD-10-CM | POA: Diagnosis not present

## 2015-01-28 HISTORY — DX: Unspecified atrial flutter: I48.92

## 2015-01-28 LAB — BASIC METABOLIC PANEL
Anion gap: 10 (ref 5–15)
BUN: 20 mg/dL (ref 6–20)
CALCIUM: 9.4 mg/dL (ref 8.9–10.3)
CHLORIDE: 104 mmol/L (ref 101–111)
CO2: 23 mmol/L (ref 22–32)
CREATININE: 1 mg/dL (ref 0.61–1.24)
GFR calc Af Amer: >60 mL/min (ref >60)
GFR calc non Af Amer: >60 mL/min (ref >60)
GLUCOSE: 173 mg/dL — AB (ref 65–99)
Potassium: 3.9 mmol/L (ref 3.5–5.1)
Sodium: 137 mmol/L (ref 135–145)

## 2015-01-28 LAB — GLUCOSE, CAPILLARY
GLUCOSE-CAPILLARY: 153 mg/dL — AB (ref 65–99)
Glucose-Capillary: 194 mg/dL — ABNORMAL HIGH (ref 65–99)

## 2015-01-28 LAB — MRSA PCR SCREENING: MRSA by PCR: NEGATIVE

## 2015-01-28 LAB — TSH: TSH: 1.419 u[IU]/mL (ref 0.350–4.500)

## 2015-01-28 LAB — TROPONIN I
TROPONIN I: 0.04 ng/mL — AB (ref <0.031)
Troponin I: 0.03 ng/mL (ref <0.031)

## 2015-01-28 MED ORDER — APIXABAN 5 MG PO TABS
5.0000 mg | ORAL_TABLET | ORAL | Status: AC
  Administered 2015-01-28: 5 mg via ORAL
  Filled 2015-01-28: qty 1

## 2015-01-28 MED ORDER — APIXABAN 5 MG PO TABS: 5.0000 mg | ORAL_TABLET | Freq: Two times a day (BID) | ORAL | Status: DC

## 2015-01-28 MED ORDER — DILTIAZEM HCL 100 MG IV SOLR
5.0000 mg/h | INTRAVENOUS | Status: DC
  Administered 2015-01-28: 5 mg/h via INTRAVENOUS
  Filled 2015-01-28 (×2): qty 100

## 2015-01-28 MED ORDER — INSULIN GLARGINE 100 UNIT/ML ~~LOC~~ SOLN
30.0000 [IU] | Freq: Every day | SUBCUTANEOUS | Status: DC
  Filled 2015-01-28: qty 0.3

## 2015-01-28 MED ORDER — APIXABAN 5 MG PO TABS
5.0000 mg | ORAL_TABLET | Freq: Two times a day (BID) | ORAL | Status: DC
  Administered 2015-01-29 – 2015-01-30 (×4): 5 mg via ORAL
  Filled 2015-01-28 (×5): qty 1

## 2015-01-28 MED ORDER — SODIUM CHLORIDE 0.9 % IV SOLN
INTRAVENOUS | Status: DC
  Administered 2015-01-28: 14:00:00 via INTRAVENOUS

## 2015-01-28 MED ORDER — PRAVASTATIN SODIUM 40 MG PO TABS
40.0000 mg | ORAL_TABLET | Freq: Every day | ORAL | Status: DC
  Administered 2015-01-28 – 2015-01-29 (×2): 40 mg via ORAL
  Filled 2015-01-28 (×3): qty 1

## 2015-01-28 MED ORDER — INSULIN GLARGINE 100 UNIT/ML ~~LOC~~ SOLN: 30.0000 [IU] | Freq: Every day | SUBCUTANEOUS | Status: DC

## 2015-01-28 MED ORDER — INSULIN ASPART 100 UNIT/ML ~~LOC~~ SOLN
0.0000 [IU] | Freq: Three times a day (TID) | SUBCUTANEOUS | Status: DC
  Administered 2015-01-28: 3 [IU] via SUBCUTANEOUS
  Administered 2015-01-29: 2 [IU] via SUBCUTANEOUS
  Administered 2015-01-30: 11 [IU] via SUBCUTANEOUS
  Administered 2015-01-30: 3 [IU] via SUBCUTANEOUS

## 2015-01-28 MED ORDER — DILTIAZEM LOAD VIA INFUSION
10.0000 mg | Freq: Once | INTRAVENOUS | Status: AC
  Administered 2015-01-28: 10 mg via INTRAVENOUS
  Filled 2015-01-28: qty 10

## 2015-01-28 MED ORDER — CLOPIDOGREL BISULFATE 75 MG PO TABS
75.0000 mg | ORAL_TABLET | Freq: Every day | ORAL | Status: DC
  Administered 2015-01-28 – 2015-01-30 (×3): 75 mg via ORAL
  Filled 2015-01-28 (×3): qty 1

## 2015-01-28 MED ORDER — INSULIN GLARGINE 100 UNIT/ML ~~LOC~~ SOLN
30.0000 [IU] | Freq: Every day | SUBCUTANEOUS | Status: DC
  Administered 2015-01-28 – 2015-01-29 (×2): 30 [IU] via SUBCUTANEOUS
  Filled 2015-01-28 (×3): qty 0.3

## 2015-01-28 NOTE — H&P (Signed)
Patient ID: Justin Blackburn, male DOB: 1950-10-05, 64 y.o. MRN: [id number]    Date: 01/28/2015   ID: Justin Blackburn, DOB [birth year], MRN [id number]  PCP: Leonides Sake, MD Primary Cardiologist: Tamala Julian  chief complaint : shortness of breath  History of Present Illness: Justin Blackburn is a 64 y.o. male  the patient is a 64 year old obese male with a history of coronary artery disease, hypertension, hypercholesterolemia, diabetes mellitus , peripheral vascular disease , heart failure. He reports for the last 2 weeks minimum he's been getting progressively more short of breath. He cannot walk across the room without having dyspnea, becoming very dizzy and developing left shoulder pain.Justin Blackburn He reports the shoulder pain resolves with rest. He does get some lower extremity edema however, that usually resolves by morning. He denies nausea, vomiting, fever, orthopnea.  he also denies PND, cough, congestion, abdominal pain, hematochezia, melena,   Wt Readings from Last 3 Encounters:  01/28/15 239 lb 6.4 oz (108.591 kg)  06/18/14 242 lb (109.77 kg)  04/17/13 238 lb (107.956 kg)     Past Medical History  Diagnosis Date  . Umbilical hernia     unrepaired  . Myocardial infarction 1988; ~1992; ~1996;~ 2000  . Angina   . Hypertension   . High cholesterol   . 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 Peripheral vascular disease   . Chronic lower back pain     "all the time from my sciatic nerve"  . Erectile dysfunction   . Hypercholesteremia   . Heart failure   . Obesity     Current Outpatient Prescriptions  Medication Sig Dispense Refill  . aspirin 81 MG EC tablet Take 81 mg by mouth daily. Swallow whole.    . carvedilol (COREG) 6.25 MG tablet Take 6.25 mg by mouth 2 (two) times daily with a meal.    . clopidogrel (PLAVIX) 75 MG tablet Take 75 mg by mouth daily.     .  furosemide (LASIX) 40 MG tablet Take 1 tablet (40 mg total) by mouth as needed. 30 tablet 6  . insulin glargine (LANTUS) 100 UNIT/ML injection Inject 60 Units into the skin daily.    . metFORMIN (GLUCOPHAGE) 1000 MG tablet Take 1,000 mg by mouth 2 (two) times daily.  0  . nitroGLYCERIN (NITROSTAT) 0.4 MG SL tablet Place 1 tablet (0.4 mg total) under the tongue every 5 (five) minutes as needed for chest pain (Angina). 25 tablet 3  . Olmesartan-Amlodipine-HCTZ (TRIBENZOR) 40-5-12.5 MG TABS Take 1 tablet by mouth daily. 90 tablet 3  . pravastatin (PRAVACHOL) 40 MG tablet Take 40 mg by mouth at bedtime.    . sitaGLIPtin (JANUVIA) 100 MG tablet Take 100 mg by mouth daily.     No current facility-administered medications for this visit.    Allergies: No Known Allergies  Social History: The patient  reports that he quit smoking about 43 years ago. His smoking use included Cigarettes. He has a 12 pack-year smoking history. He quit smokeless tobacco use about 28 years ago. He reports that he does not drink alcohol or use illicit drugs.   Family history:  Family History  Problem Relation Age of Onset  . Heart disease Mother   . Heart attack Mother   . Heart disease Father   . Heart attack Father   . Heart attack Brother   . Stroke Neg Hx     ROS: Please see the history of present illness. All other systems reviewed and negative.  PHYSICAL EXAM: VS: BP 120/82 mmHg  Pulse 110  Ht 5\' 10"  (1.778 m)  Wt 239 lb 6.4 oz (108.591 kg)  BMI 34.35 kg/m2  SpO2 95% , well developed, in no acute distress  HEENT: Pupils are equal round react to light accommodation extraocular movements are intact.  Neck: no JVD No cervical lymphadenopathy. Cardiac: Regular rhythm. Rate elevated. No murmurs rubs or gallops Lungs: clear to auscultation bilaterally, no wheezing, rhonchi or rales  Abd: soft, nontender, positive bowel sounds all  quadrants,  Ext: no lower extremity edema. 2+ radial and dorsalis pedis pulses. Skin: warm and dry  Neuro: Grossly normal  EKG: Atrial flutter with 2-1 conduction. Rate 112 bpm   ASSESSMENT AND PLAN:  atrial flutter : Patient is having symptomatic atrial flutter with 2-1 conduction. He's going to be admitted to stepdown and started on IV Cardizem. CHADSVASC = 5. Start Eliquis 5mg  bid. He has no prior kidney disease. We will check basic metabolic panel. We will try and schedule TEE/DC CV for tomorrow. Check transthoracic echo as well  left shoulder pain: This sounds like an anginal equivalent. Will follow serial troponins. Will need ischemic evaluation with probably a stress test.  diabetes mellitus: Start sliding scale. Continue Lantus. Hold oral agents.  Hypertension : Blood pressure today looks very good. Continue to monitor on IV Cardizem. Will hold other agents.        The patient was seen, examined and discussed with Justin Fuller, PA-C and I agree with the above.   64 year old with known CAD, HTN, HLP who presented with progressively worsening DOE, retrosternal chest pain, left shoulder pain, currently after minimal exertion and was found to be in a-flutter with variable block and RVR. We will admit, start on Eliquis 5 mg po BID as CHADS-VASc 5, start low dose cardizem drip and schedule for a TEE/DCCV in the am. The patient will also require an ischemic work up, with a stress test a day after DCCV.  Justin Blackburn 01/28/2015

## 2015-01-28 NOTE — Patient Instructions (Signed)
Medication Instructions:  Your physician recommends that you continue on your current medications as directed. Please refer to the Current Medication list given to you today.    Labwork: None   Testing/Procedures: None   Follow-Up:    Any Other Special Instructions Will Be Listed Below (If Applicable).  You are being admitted to [facility]. Please report to [facility] admiting

## 2015-01-28 NOTE — Care Management Note (Signed)
Case Management Note  Patient Details  Name: Justin Blackburn MRN: [id number] Date of Birth: 1951-06-03  Subjective/Objective:    Adm w at flutter               Action/Plan:lives w wife   Expected Discharge Date:                  Expected Discharge Plan:  Home/Self Care  In-House Referral:     Discharge planning Services  CM Consult, Medication Assistance  Post Acute Care Choice:    Choice offered to:     DME Arranged:    DME Agency:     HH Arranged:    [place] Agency:     Status of Service:     Medicare Important Message Given:    Date Medicare IM Given:    Medicare IM give by:    Date Additional Medicare IM Given:    Additional Medicare Important Message give by:     If discussed at [place] of Stay Meetings, dates discussed:    Additional Comments:started on eliquis. Gave pt 30day free eliquis card.  Lacretia Leigh, RN 01/28/2015, 3:22 PM

## 2015-01-28 NOTE — Progress Notes (Signed)
Patient ID: MATI STELLWAGEN, male   DOB: Jan 22, 1951, 64 y.o.   MRN: [id number]    Date:  01/28/2015   ID:  HARL VANDERKOOI, DOB Nov 29, 1950, MRN [id number]  PCP:  Leonides Sake, MD  Primary Cardiologist:  Tamala Julian   chief complaint : shortness of breath   History of Present Illness: SHAQWAN HOAK is a 64 y.o. male   the patient is a 64 year old obese male with a history of coronary artery disease, hypertension, hypercholesterolemia, diabetes mellitus , peripheral vascular disease , heart failure.   He reports for the last 2 weeks minimum he's been getting progressively more short of breath. He cannot walk across the room without having dyspnea, becoming very dizzy and developing left shoulder pain.[name]  He reports the shoulder pain resolves with rest.  He does get some lower extremity edema however, that usually resolves by morning. He denies nausea, vomiting, fever, orthopnea.   he also denies PND, cough, congestion, abdominal pain, hematochezia, melena,   Wt Readings from Last 3 Encounters:  01/28/15 239 lb 6.4 oz (108.591 kg)  06/18/14 242 lb (109.77 kg)  04/17/13 238 lb (107.956 kg)     Past Medical History  Diagnosis Date  . Umbilical hernia     unrepaired  . Myocardial infarction 1988; ~1992;  ~1996;~ 2000  . Angina   . Hypertension   . High cholesterol   . 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 Peripheral vascular disease   . Chronic lower back pain     "all the time from my sciatic nerve"  . Erectile dysfunction   . Hypercholesteremia   . Heart failure   . Obesity     Current Outpatient Prescriptions  Medication Sig Dispense Refill  . aspirin 81 MG EC tablet Take 81 mg by mouth daily. Swallow whole.    . carvedilol (COREG) 6.25 MG tablet Take 6.25 mg by mouth 2 (two) times daily with a meal.    . clopidogrel (PLAVIX) 75 MG tablet Take 75 mg by mouth daily.     . furosemide (LASIX) 40 MG tablet Take 1 tablet (40 mg total) by mouth as needed. 30 tablet 6  .  insulin glargine (LANTUS) 100 UNIT/ML injection Inject 60 Units into the skin daily.    . metFORMIN (GLUCOPHAGE) 1000 MG tablet Take 1,000 mg by mouth 2 (two) times daily.  0  . nitroGLYCERIN (NITROSTAT) 0.4 MG SL tablet Place 1 tablet (0.4 mg total) under the tongue every 5 (five) minutes as needed for chest pain (Angina). 25 tablet 3  . Olmesartan-Amlodipine-HCTZ (TRIBENZOR) 40-5-12.5 MG TABS Take 1 tablet by mouth daily. 90 tablet 3  . pravastatin (PRAVACHOL) 40 MG tablet Take 40 mg by mouth at bedtime.    . sitaGLIPtin (JANUVIA) 100 MG tablet Take 100 mg by mouth daily.     No current facility-administered medications for this visit.    Allergies:   No Known Allergies  Social History:  The patient  reports that he quit smoking about 43 years ago. His smoking use included Cigarettes. He has a 12 pack-year smoking history. He quit smokeless tobacco use about 28 years ago. He reports that he does not drink alcohol or use illicit drugs.   Family history:   Family History  Problem Relation Age of Onset  . Heart disease Mother   . Heart attack Mother   . Heart disease Father   . Heart attack Father   . Heart attack Brother   .  Stroke Neg Hx     ROS:  Please see the history of present illness.  All other systems reviewed and negative.   PHYSICAL EXAM: VS:  BP 120/82 mmHg  Pulse 110  Ht 5\' 10"  (1.778 m)  Wt 239 lb 6.4 oz (108.591 kg)  BMI 34.35 kg/m2  SpO2 95% , well developed, in no acute distress HEENT: Pupils are equal round react to light accommodation extraocular movements are intact.  Neck: no JVDNo cervical lymphadenopathy. Cardiac:  Regular rhythm. Rate elevated. No murmurs rubs or gallops Lungs:  clear to auscultation bilaterally, no wheezing, rhonchi or rales Abd: soft, nontender, positive bowel sounds all quadrants,  Ext: no lower extremity edema.  2+ radial and dorsalis pedis pulses. Skin: warm and dry Neuro:  Grossly normal  EKG:   Atrial flutter with 2-1  conduction.   Rate 112 bpm   ASSESSMENT AND PLAN:   atrial flutter :   Patient is having symptomatic atrial flutter with 2-1 conduction. He's going to be admitted to stepdown and started on IV Cardizem. CHADSVASC = 5.  Start Eliquis 5mg  bid.   He has no prior kidney disease. We will check basic metabolic panel.   We will try and schedule TEE/DC CV for tomorrow.   Check transthoracic echo as well   left shoulder pain:   This sounds like an anginal equivalent. Will follow serial troponins. Will need ischemic evaluation with probably a stress test.   diabetes mellitus:   Start sliding scale.   Continue Lantus. Hold oral agents.   Hypertension :   Blood pressure today looks very good. Continue to monitor on IV Cardizem. Will hold other agents.

## 2015-01-28 NOTE — Care Management Note (Signed)
Case Management Note  Patient Details  Name: Justin Blackburn MRN: [id number] Date of Birth: 1950/10/07  Subjective/Objective:          Adm w at flutter          Action/Plan:lives w wife, pcp dr Daiva Eves   Expected Discharge Date:                  Expected Discharge Plan:  Home/Self Care  In-House Referral:     Discharge planning Services     Post Acute Care Choice:    Choice offered to:     DME Arranged:    DME Agency:     HH Arranged:    [place] Agency:     Status of Service:     Medicare Important Message Given:    Date Medicare IM Given:    Medicare IM give by:    Date Additional Medicare IM Given:    Additional Medicare Important Message give by:     If discussed at [place] of Stay Meetings, dates discussed:    Additional Comments: ur review done  Lacretia Leigh, RN 01/28/2015, 2:17 PM

## 2015-01-29 ENCOUNTER — Inpatient Hospital Stay (HOSPITAL_COMMUNITY): Payer: PPO | Admitting: Anesthesiology

## 2015-01-29 ENCOUNTER — Inpatient Hospital Stay (HOSPITAL_COMMUNITY): Payer: PPO

## 2015-01-29 ENCOUNTER — Encounter (HOSPITAL_COMMUNITY)
Admission: AD | Disposition: A | Discharge status: Home / Self Care | Payer: Self-pay | Source: Ambulatory Visit | Attending: Interventional Cardiology

## 2015-01-29 ENCOUNTER — Encounter (HOSPITAL_COMMUNITY): Payer: Self-pay

## 2015-01-29 DIAGNOSIS — R7989 Other specified abnormal findings of blood chemistry: Secondary | ICD-10-CM

## 2015-01-29 DIAGNOSIS — I5043 Acute on chronic combined systolic (congestive) and diastolic (congestive) heart failure: Secondary | ICD-10-CM

## 2015-01-29 DIAGNOSIS — I25708 Atherosclerosis of coronary artery bypass graft(s), unspecified, with other forms of angina pectoris: Secondary | ICD-10-CM

## 2015-01-29 DIAGNOSIS — I480 Paroxysmal atrial fibrillation: Secondary | ICD-10-CM

## 2015-01-29 HISTORY — PX: CARDIOVERSION: SHX1299

## 2015-01-29 HISTORY — PX: TEE WITHOUT CARDIOVERSION: SHX5443

## 2015-01-29 LAB — TROPONIN I: Troponin I: 0.04 ng/mL — ABNORMAL HIGH (ref <0.031)

## 2015-01-29 LAB — GLUCOSE, CAPILLARY
GLUCOSE-CAPILLARY: 128 mg/dL — AB (ref 65–99)
GLUCOSE-CAPILLARY: 122 mg/dL — AB (ref 65–99)
GLUCOSE-CAPILLARY: 122 mg/dL — AB (ref 65–99)
Glucose-Capillary: 231 mg/dL — ABNORMAL HIGH (ref 65–99)

## 2015-01-29 LAB — HEMOGLOBIN A1C
HEMOGLOBIN A1C: 9.8 % — AB (ref 4.8–5.6)
MEAN PLASMA GLUCOSE: 235 mg/dL

## 2015-01-29 SURGERY — ECHOCARDIOGRAM, TRANSESOPHAGEAL
Anesthesia: Monitor Anesthesia Care

## 2015-01-29 MED ORDER — LIDOCAINE VISCOUS 2 % MT SOLN
OROMUCOSAL | Status: AC
  Filled 2015-01-29: qty 15

## 2015-01-29 MED ORDER — SODIUM CHLORIDE 0.9 % IV SOLN
INTRAVENOUS | Status: DC | PRN
  Administered 2015-01-29: 14:00:00 via INTRAVENOUS

## 2015-01-29 MED ORDER — PROMETHAZINE HCL 25 MG/ML IJ SOLN: 6.2500 mg | INTRAMUSCULAR | Status: DC | PRN

## 2015-01-29 MED ORDER — LIDOCAINE HCL (CARDIAC) 20 MG/ML IV SOLN
INTRAVENOUS | Status: DC | PRN
  Administered 2015-01-29: 100 mg via INTRAVENOUS

## 2015-01-29 MED ORDER — LIDOCAINE VISCOUS 2 % MT SOLN
OROMUCOSAL | Status: DC | PRN
  Administered 2015-01-29: 10 mL via OROMUCOSAL

## 2015-01-29 MED ORDER — PROPOFOL INFUSION 10 MG/ML OPTIME
INTRAVENOUS | Status: DC | PRN
  Administered 2015-01-29: 50 ug/kg/min via INTRAVENOUS

## 2015-01-29 MED ORDER — SODIUM CHLORIDE 0.9 % IV SOLN
INTRAVENOUS | Status: DC
  Administered 2015-01-29: 14:00:00 via INTRAVENOUS

## 2015-01-29 NOTE — Procedures (Signed)
Electrical Cardioversion Procedure Note Justin Blackburn [id number] 06-28-1950  Procedure: Electrical Cardioversion Indications:  Atrial Fibrillation  Procedure Details Consent: Risks of procedure as well as the alternatives and risks of each were explained to the (patient/caregiver).  Consent for procedure obtained. Time Out: Verified patient identification, verified procedure, site/side was marked, verified correct patient position, special equipment/implants available, medications/allergies/relevent history reviewed, required imaging and test results available.  Performed  Patient placed on cardiac monitor, pulse oximetry, supplemental oxygen as necessary.  Sedation given: propafol Pacer pads placed anterior and posterior chest.  Cardioverted 2 time(s).  Cardioverted at [place].  Evaluation Findings: Post procedure EKG shows: NSR Complications: None Patient did tolerate procedure well.   Justin Blackburn P 01/29/2015, 3:07 PM

## 2015-01-29 NOTE — Transfer of Care (Signed)
Immediate Anesthesia Transfer of Care Note  Patient: Justin Blackburn  Procedure(s) Performed: Procedure(s): TRANSESOPHAGEAL ECHOCARDIOGRAM (TEE) (N/A) CARDIOVERSION (N/A)  Patient Location: PACU and Endoscopy Unit  Anesthesia Type:General  Level of Consciousness: awake, alert  and oriented  Airway & Oxygen Therapy: Patient Spontanous Breathing and Patient connected to nasal cannula oxygen  Post-op Assessment: Report given to RN, Post -op Vital signs reviewed and stable and Patient moving all extremities X 4  Post vital signs: Reviewed and stable  Last Vitals:  Filed Vitals:   01/29/15 1516  BP: 123/75  Pulse: 62  Temp: 36.3 C  Resp:     Complications: No apparent anesthesia complications

## 2015-01-29 NOTE — CV Procedure (Signed)
Transesophageal echo performed without complication. No LA appendage or LA thrombus or mass Successful DCCV performed.  See full report for details.

## 2015-01-29 NOTE — Anesthesia Preprocedure Evaluation (Addendum)
Anesthesia Evaluation  Patient identified by MRN, date of birth, ID band Patient awake    Reviewed: Allergy & Precautions, NPO status , Patient's Chart, lab work & pertinent test results  Airway Mallampati: II  TM Distance: >3 FB Neck ROM: Full    Dental no notable dental hx. (+) Teeth Intact, Dental Advisory Given   Pulmonary neg pulmonary ROS, former smoker,  breath sounds clear to auscultation  Pulmonary exam normal       Cardiovascular hypertension, Pt. on medications + CAD, + Past MI, + Cardiac Stents and + CABG + dysrhythmias Atrial Fibrillation Rhythm:Irregular Rate:Normal     Neuro/Psych negative neurological ROS  negative psych ROS   GI/Hepatic negative GI ROS, Neg liver ROS,   Endo/Other  diabetes, Insulin Dependent  Renal/GU negative Renal ROS  negative genitourinary   Musculoskeletal negative musculoskeletal ROS (+)   Abdominal   Peds negative pediatric ROS (+)  Hematology negative hematology ROS (+)   Anesthesia Other Findings   Reproductive/Obstetrics negative OB ROS                           Anesthesia Physical Anesthesia Plan  ASA: III  Anesthesia Plan: MAC   Post-op Pain Management:    Induction: Intravenous  Airway Management Planned: Mask  Additional Equipment:   Intra-op Plan:   Post-operative Plan:   Informed Consent: I have reviewed the patients History and Physical, chart, labs and discussed the procedure including the risks, benefits and alternatives for the proposed anesthesia with the patient or authorized representative who has indicated his/her understanding and acceptance.   Dental advisory given  Plan Discussed with: CRNA and Surgeon  Anesthesia Plan Comments:         Anesthesia Quick Evaluation

## 2015-01-29 NOTE — Progress Notes (Signed)
       Patient Name: KAIL VISCONTI Date of Encounter: 01/29/2015    SUBJECTIVE: Dewarren Alamos is well known to me. He has no prior history of atrial arrhythmias. 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 He was seen in the office chest today after developing progressive lower extremity swelling and dyspnea. EKG reportedly revealed atrial flutter. Since admission he has been stable. He has had minimal diuresis.  TELEMETRY:  He has an atrial fib/flutter with poor ventricular rate control. Filed Vitals:   01/29/15 0031 01/29/15 0400 01/29/15 0401 01/29/15 0740  BP: 138/79 142/76  134/87  Pulse:      Temp: 97.8 F (36.6 C)  98.1 F (36.7 C) 98.2 F (36.8 C)  TempSrc: Oral  Oral Oral  Resp: 22 16  18   SpO2: 95%  95% 96%    Intake/Output Summary (Last 24 hours) at 01/29/15 0901 Last data filed at 01/29/15 0800  Gross per 24 hour  Intake    630 ml  Output   1475 ml  Net   -845 ml   LABS: Basic Metabolic Panel:  Recent Labs  01/28/15 1315  NA 137  K 3.9  CL 104  CO2 23  GLUCOSE 173*  BUN 20  CREATININE 1.00  CALCIUM 9.4   CBC: No results for input(s): WBC, NEUTROABS, HGB, HCT, MCV, PLT in the last 72 hours. Cardiac Enzymes:  Recent Labs  01/28/15 1315 01/28/15 1907 01/29/15 0127  TROPONINI 0.03 0.04* 0.04*   BNP: Invalid input(s): POCBNP Hemoglobin A1C:  Recent Labs  01/28/15 1315  HGBA1C 9.8*   Fasting Lipid Panel: No results for input(s): CHOL, HDL, LDLCALC, TRIG, CHOLHDL, LDLDIRECT in the last 72 hours.  Radiology/Studies:  No x-ray was ordered  Physical Exam: Blood pressure 134/87, pulse 96, temperature 98.2 F (36.8 C), temperature source Oral, resp. rate 18, SpO2 96 %. Weight change:   Wt Readings from Last 3 Encounters:  01/28/15 108.591 kg (239 lb 6.4 oz)  06/18/14 109.77 kg (242 lb)  04/17/13 107.956 kg (238 lb)   No acute distress Decreased breath sounds at both bases Cardiac exam  reveals an irregular rhythm Extremities reveal edema right leg greater than left Neuro is intact  ASSESSMENT:  1. Atrial flutter with poor rate control. This is a new dysrhythmia. 2. Chronic ischemic heart disease with combined systolic and diastolic dysfunction. No recent echocardiogram is available. Heart failure is decompensated likely because of new onset atrial flutter. 3. 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 Coronary angiogram done in 2013 demonstrated widely patent right internal mammary to RCA and left internal mammary to LAD. 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 Drug-coated stent In circumflex coronary artery 2013. 4. Elevated troponin, likely demand related. 5. Hypertension with adequate blood pressure control 6. Diabetes mellitus, markedly out of control with hemoglobin A1c 9.8.  Plan:  1. TEE guided cardioversion today. Orders written. 2. Continue anticoagulation without liquids 3. Aggressive diuresis 4. Will need an ischemic evaluation after heart failure clears. This could perhaps even be done as an outpatient.  KEMONTA, SEYMOUR 01/29/2015, 9:01 AM

## 2015-01-29 NOTE — Progress Notes (Signed)
Pt down to endo suite, NAD, VSS, Consent signed and pt verbalized understanding.

## 2015-01-30 ENCOUNTER — Other Ambulatory Visit: Payer: Self-pay | Admitting: Physician Assistant

## 2015-01-30 ENCOUNTER — Inpatient Hospital Stay (HOSPITAL_COMMUNITY): Payer: PPO

## 2015-01-30 ENCOUNTER — Encounter (HOSPITAL_COMMUNITY): Payer: Self-pay | Admitting: Cardiovascular Disease

## 2015-01-30 ENCOUNTER — Telehealth: Payer: Self-pay | Admitting: Interventional Cardiology

## 2015-01-30 DIAGNOSIS — E669 Obesity, unspecified: Secondary | ICD-10-CM

## 2015-01-30 DIAGNOSIS — I5023 Acute on chronic systolic (congestive) heart failure: Secondary | ICD-10-CM

## 2015-01-30 DIAGNOSIS — Z79899 Other long term (current) drug therapy: Secondary | ICD-10-CM

## 2015-01-30 DIAGNOSIS — I4892 Unspecified atrial flutter: Secondary | ICD-10-CM

## 2015-01-30 DIAGNOSIS — E119 Type 2 diabetes mellitus without complications: Secondary | ICD-10-CM

## 2015-01-30 DIAGNOSIS — I5043 Acute on chronic combined systolic (congestive) and diastolic (congestive) heart failure: Secondary | ICD-10-CM

## 2015-01-30 DIAGNOSIS — I10 Essential (primary) hypertension: Secondary | ICD-10-CM

## 2015-01-30 DIAGNOSIS — I248 Other forms of acute ischemic heart disease: Secondary | ICD-10-CM

## 2015-01-30 LAB — GLUCOSE, CAPILLARY
GLUCOSE-CAPILLARY: 170 mg/dL — AB (ref 65–99)
Glucose-Capillary: 314 mg/dL — ABNORMAL HIGH (ref 65–99)

## 2015-01-30 MED ORDER — APIXABAN 5 MG PO TABS: 5.0000 mg | ORAL_TABLET | Freq: Two times a day (BID) | ORAL | Status: DC

## 2015-01-30 MED ORDER — IRBESARTAN 300 MG PO TABS: 300.0000 mg | ORAL_TABLET | Freq: Every day | ORAL | Status: DC

## 2015-01-30 MED ORDER — AMLODIPINE BESYLATE 5 MG PO TABS: 5.0000 mg | ORAL_TABLET | Freq: Every day | ORAL | Status: DC

## 2015-01-30 MED ORDER — CARVEDILOL 6.25 MG PO TABS
6.2500 mg | ORAL_TABLET | Freq: Two times a day (BID) | ORAL | Status: DC
  Administered 2015-01-30: 6.25 mg via ORAL
  Filled 2015-01-30: qty 1

## 2015-01-30 MED ORDER — IRBESARTAN 300 MG PO TABS
300.0000 mg | ORAL_TABLET | Freq: Every day | ORAL | Status: DC
  Administered 2015-01-30: 300 mg via ORAL
  Filled 2015-01-30: qty 1

## 2015-01-30 MED ORDER — FUROSEMIDE 40 MG PO TABS: 40.0000 mg | ORAL_TABLET | Freq: Every day | ORAL | Status: DC

## 2015-01-30 MED ORDER — FUROSEMIDE 40 MG PO TABS
40.0000 mg | ORAL_TABLET | Freq: Every day | ORAL | Status: DC
  Administered 2015-01-30: 40 mg via ORAL
  Filled 2015-01-30: qty 1

## 2015-01-30 NOTE — Progress Notes (Signed)
Pt d/c per MD order. Pt voices understanding on medications and education given. Appropriate paperwork signed. Pt waiting for son to give pt a ride home.

## 2015-01-30 NOTE — Discharge Summary (Signed)
Discharge Summary   Patient ID: Justin Blackburn,  MRN: [id number], DOB/AGE: 1951-06-14 64 y.o.  Admit date: 01/28/2015 Discharge date: 01/30/2015  Primary Care Provider: [facility] Primary Cardiologist: Dr. Tamala Julian  Discharge Diagnoses Principal Problem:   Atrial flutter with rapid ventricular response Active Problems:   CAD (coronary artery disease), autologous vein bypass graft   Hypertension   Diabetes mellitus   Peripheral vascular disease   Hypercholesteremia   Acute on chronic combined systolic and diastolic heart failure   Demand ischemia   Allergies No Known Allergies  Procedures  TEE 01/29/2015  LV EF: 25% -  30%  ------------------------------------------------------------------- Indications:   Atrial fibrillation - 427.31.  ------------------------------------------------------------------- History:  PMH:  Atrial fibrillation.  ------------------------------------------------------------------- Study Conclusions  - Left ventricle: Systolic function was severely reduced. The estimated ejection fraction was in the range of 25% to 30%. No evidence of thrombus. - Mitral valve: There was mild regurgitation. - Left atrium: No evidence of thrombus in the atrial cavity or appendage. No evidence of thrombus in the atrial cavity or appendage. - Right ventricle: The cavity size was dilated. Systolic function was mildly to moderately reduced. - Atrial septum: No defect or patent foramen ovale was identified. There was redundancy of the septum, with borderline criteria for aneurysm.  Impressions:  - Successful cardioversion. No cardiac source of emboli was indentified. Full exam was not performed due to patient discomfort.    DCCV 01/29/2015  Transesophageal echo performed without complication. No LA appendage or LA thrombus or mass Successful DCCV performed.  See full report for details.    Hospital Course  The patient is a  64 year old male with past medical history of CAD, HTN, hypercholesterolemia, DM, peripheral vascular disease and history of chronic ischemic heart disease risk chronic combined systolic and diastolic heart failure presented with progressive increasing shortness breath with minimal exertion, dizziness, and left shoulder pain on 01/28/2015. On arrival to the cardiology office, he was noted to be in 2:1 atrial flutter with RVR. He was admitted directly from cardiology office to stepdown unit. He was started on IV Cardizem. Given CHADSVASC = 5,  5 mg twice a day of eliquis was started. he was scheduled for TEE/DC cardioversion. On arrival, chest x-ray showed low-grade pulmonary interstitial edema secondary to heart failure. It was felt patient likely had acute on chronic combined systolic and diastolic heart failure in the setting of atrial flutter with RVR. Overnight, his serial troponin was borderline elevated at 0.04 which may be demand ischemia in the setting of acute heart failure and a flutter with RVR.    Patient underwent scheduled TEE cardioversion on 01/29/2015. TEE showed EF 25-30%, mild MR, mildly to moderately reduced RV EF, no cardiac source of emboli was identified. Patient underwent successful DC cardioversion. Unfortunately, the TEE result does show the patient has severely decreased LV systolic function even though he denies any angina. He was restarted on carvedilol 6.25 mg twice a day. He was also started on ARB and daily regiment of Lasix. He ambulated in the cardiac ICU without significant discomfort. He is deemed stable for discharge from cardiology perspective with plan for outpatient ischemic workup with elective cath once he is stable on current medications. I have arranged seven-day transition of care follow-up and also BMET on the same day.  Of note, during this hospitalization, his hemoglobin A1c was 9.8, he will need close outpatient follow-up with his PCP for diabetes  management.  Discharge Vitals Blood pressure 127/69, pulse 71, temperature 98.1 F (36.7  C), temperature source Oral, resp. rate 16, height 5\' 10"  (1.778 m), weight 239 lb 3.2 oz (108.5 kg), SpO2 94 %.  Filed Weights   01/29/15 0700  Weight: 239 lb 3.2 oz (108.5 kg)    Labs  Basic Metabolic Panel  Recent Labs  01/28/15 1315  NA 137  K 3.9  CL 104  CO2 23  GLUCOSE 173*  BUN 20  CREATININE 1.00  CALCIUM 9.4   Cardiac Enzymes  Recent Labs  01/28/15 1315 01/28/15 1907 01/29/15 0127  TROPONINI 0.03 0.04* 0.04*   Hemoglobin A1C  Recent Labs  01/28/15 1315  HGBA1C 9.8*   Thyroid Function Tests  Recent Labs  01/28/15 1315  TSH 1.419    Disposition  Pt is being discharged home today in good condition.  Follow-up Plans & Appointments      Follow-up Information    Follow up with Erlene Quan, PA-C On 02/06/2015.   Specialties:  Cardiology, Radiology   Why:  2:30pm. Please arrive 30 min early to obtain BMET lab to monitor potassium and renal function.   Contact information:   [address] [place] [address] [phone]       Follow up with [facility], MD.   Specialty:  Family Medicine   Why:  Please followup with your primary care physician closely regarding uncontrolled diabetes   Contact information:   Dr. Daiva Eves [place]  [id number] [phone]       Discharge Medications    Medication List    STOP taking these medications        amLODipine 5 MG tablet  Commonly known as:  NORVASC     aspirin 81 MG EC tablet      TAKE these medications        apixaban 5 MG Tabs tablet  Commonly known as:  ELIQUIS  Take 1 tablet (5 mg total) by mouth 2 (two) times daily.     carvedilol 6.25 MG tablet  Commonly known as:  COREG  Take 6.25 mg by mouth 2 (two) times daily with a meal.     clopidogrel 75 MG tablet  Commonly known as:  PLAVIX  Take 75 mg by mouth daily.     furosemide 40 MG  tablet  Commonly known as:  LASIX  Take 1 tablet (40 mg total) by mouth daily.     irbesartan 300 MG tablet  Commonly known as:  AVAPRO  Take 1 tablet (300 mg total) by mouth daily.     LANTUS SOLOSTAR 100 UNIT/ML Solostar Pen  Generic drug:  Insulin Glargine  Inject 75 Units into the skin daily.     metFORMIN 1000 MG tablet  Commonly known as:  GLUCOPHAGE  Take 1,000 mg by mouth 2 (two) times daily.     nitroGLYCERIN 0.4 MG SL tablet  Commonly known as:  NITROSTAT  Place 1 tablet (0.4 mg total) under the tongue every 5 (five) minutes as needed for chest pain (Angina).     pravastatin 40 MG tablet  Commonly known as:  PRAVACHOL  Take 40 mg by mouth at bedtime.     sitaGLIPtin 100 MG tablet  Commonly known as:  JANUVIA  Take 100 mg by mouth daily.        Outstanding Labs/Studies  Obtain BMET on the same day of your cardiology followup  Duration of Discharge Encounter   Greater than 30 minutes including physician time.  Hilbert Corrigan PA-C Pager: [phone] 01/30/2015,  3:23 PM

## 2015-01-30 NOTE — Progress Notes (Signed)
Inpatient Diabetes Program Recommendations  AACE/ADA: New Consensus Statement on Inpatient Glycemic Control (2013)  Target Ranges:  Prepandial:   less than 140 mg/dL      Peak postprandial:   less than 180 mg/dL (1-2 hours)      Critically ill patients:  140 - 180 mg/dL   Inpatient Diabetes Program Recommendations Correction (SSI): add HS scale  Thank you  Raoul Pitch BSN, RN,CDE Inpatient Diabetes Coordinator [phone] (team pager)

## 2015-01-30 NOTE — Anesthesia Postprocedure Evaluation (Signed)
  Anesthesia Post-op Note  Patient: Justin Blackburn  Procedure(s) Performed: Procedure(s) (LRB): TRANSESOPHAGEAL ECHOCARDIOGRAM (TEE) (N/A) CARDIOVERSION (N/A)  Patient Location: PACU  Anesthesia Type: MAC  Level of Consciousness: awake and alert   Airway and Oxygen Therapy: Patient Spontanous Breathing  Post-op Pain: mild  Post-op Assessment: Post-op Vital signs reviewed, Patient's Cardiovascular Status Stable, Respiratory Function Stable, Patent Airway and No signs of Nausea or vomiting  Last Vitals:  Filed Vitals:   01/30/15 1152  BP: 127/69  Pulse:   Temp: 36.7 C  Resp: 16    Post-op Vital Signs: stable   Complications: No apparent anesthesia complications

## 2015-01-30 NOTE — Progress Notes (Addendum)
       Patient Name: Justin Blackburn Date of Encounter: 01/30/2015    SUBJECTIVE: He feels great this morning. He is maintaining sinus rhythm. Unfortunately the TEE yesterday demonstrated severe left ventricular systolic dysfunction. He denies angina.  TELEMETRY:  Normal sinus rhythm without recurrent atrial flutter Filed Vitals:   01/29/15 1900 01/29/15 2317 01/30/15 0306 01/30/15 0806  BP: 138/72 120/45 145/80 134/79  Pulse: 70 74 71   Temp: 98.1 F (36.7 C) 97.8 F (36.6 C) 97.9 F (36.6 C) 97.7 F (36.5 C)  TempSrc: Oral Oral Oral Oral  Resp: 18 18 18 16   Height:      Weight:      SpO2: 96% 92% 94% 95%    Intake/Output Summary (Last 24 hours) at 01/30/15 1014 Last data filed at 01/30/15 0806  Gross per 24 hour  Intake    285 ml  Output   1000 ml  Net   -715 ml   LABS: Basic Metabolic Panel:  Recent Labs  01/28/15 1315  NA 137  K 3.9  CL 104  CO2 23  GLUCOSE 173*  BUN 20  CREATININE 1.00  CALCIUM 9.4   CBC: No results for input(s): WBC, NEUTROABS, HGB, HCT, MCV, PLT in the last 72 hours. Cardiac Enzymes:  Recent Labs  01/28/15 1315 01/28/15 1907 01/29/15 0127  TROPONINI 0.03 0.04* 0.04*   Hemoglobin A1C:  Recent Labs  01/28/15 1315  HGBA1C 9.8*   Fasting Lipid Panel: No results for input(s): CHOL, HDL, LDLCALC, TRIG, CHOLHDL, LDLDIRECT in the last 72 hours.  Radiology/Studies:  Cardiac enlargement with interstitial edema on 01/29/2015  Physical Exam: Blood pressure 134/79, pulse 71, temperature 97.7 F (36.5 C), temperature source Oral, resp. rate 16, height 5\' 10"  (1.778 m), weight 108.5 kg (239 lb 3.2 oz), SpO2 95 %. Weight change:   Wt Readings from Last 3 Encounters:  01/29/15 108.5 kg (239 lb 3.2 oz)  01/28/15 108.591 kg (239 lb 6.4 oz)  06/18/14 109.77 kg (242 lb)    Faint basilar crackles An S4 gallop is audible. An apical systolic murmurs heard. No peripheral edema.  ASSESSMENT:  1. Acute on chronic systolic heart  failure. There is suspicion that a component of tachycardia related LV depression is present. 2. Atrial flutter has been reverted to normal sinus rhythm after conversion. He is maintaining normal sinus rhythm 3. Poorly controlled risk factors including diabetes 4. Coronary artery disease without evidence of angina or anginal equivalent complaints. 5. Elevated troponin likely related to heart failure/stress   Plan:  Plan discharge late today.  Reinstitute carvedilol 6.25 mg twice a day. This is his typical outpatient therapy  Reinstitute ARB therapy and diuretic ( Was on Tribenzor which we will DC)  Ambulate after medications her back on board and if does well discharge with transition of care follow-up in 7 days including a basic metabolic panel.  Plan OP ischemic work up with elective cath once stable on meds Signed, DEV, BILDERBACK 01/30/2015, 10:14 AM

## 2015-01-30 NOTE — Clinical Documentation Improvement (Signed)
Cardiology  Query #1 Conflicting information exists in the current medical record regarding the TYPE of heart failure monitored and treated this admission.  Please clarify and document the TYPE of Heart Failure:  - Systolic  - Diastolic  - Combined, Systolic and Diastolic  Clinical Information: "Combined Heart Failure" is documented in Dr. Thompson Caul progress note dated 01/29/15. "Acute on Chronic Systolic Heart Failure" is documented in Dr. Thompson Caul progress note dated 01/30/15.   Query #2 Please document in the progress notes and discharge summary if condition below provides greater specificity regarding the patient's elevated troponins:  - Demand Ischemia   - Other condition  - Unable to clinically determine  Clinical Information "Elevated troponin, likely demand related" is documented in Dr. Thompson Caul progress note dated 01/29/15/ "Elevated troponin likely related to heart failure/stress" is documented in Dr. Thompson Caul progress note dated 01/30/15   Please exercise your independent, professional judgment when responding. A specific answer is not anticipated or expected.   Thank You, [place]

## 2015-01-30 NOTE — Telephone Encounter (Signed)
New problem    7-10 TCM per Isaac Laud with Kerin Ransom on 02/06/15.

## 2015-01-30 NOTE — Progress Notes (Signed)
Echocardiogram 2D Echocardiogram has been performed.  Justin Blackburn 01/30/2015, 9:37 AM

## 2015-01-31 NOTE — Telephone Encounter (Signed)
Left message to call back  

## 2015-02-01 NOTE — Telephone Encounter (Signed)
Patient contacted regarding discharge from [facility] on 01/30/2015.  Patient understands to follow up with provider Kerin Ransom PA on 02/08/2015 at 2:30 p.m.  at [address]. Patient understands discharge instructions? yes Patient understands medications and regiment? yes Patient understands to bring all medications to this visit? yes

## 2015-02-06 ENCOUNTER — Encounter: Payer: Self-pay | Admitting: Cardiology

## 2015-02-06 ENCOUNTER — Ambulatory Visit (INDEPENDENT_AMBULATORY_CARE_PROVIDER_SITE_OTHER): Payer: PPO | Admitting: Cardiology

## 2015-02-06 VITALS — BP 113/70 | HR 64 | Wt 237.2 lb

## 2015-02-06 DIAGNOSIS — Z951 Presence of aortocoronary bypass graft: Secondary | ICD-10-CM

## 2015-02-06 DIAGNOSIS — I5043 Acute on chronic combined systolic (congestive) and diastolic (congestive) heart failure: Secondary | ICD-10-CM | POA: Diagnosis not present

## 2015-02-06 DIAGNOSIS — I4892 Unspecified atrial flutter: Secondary | ICD-10-CM

## 2015-02-06 DIAGNOSIS — Z7901 Long term (current) use of anticoagulants: Secondary | ICD-10-CM | POA: Diagnosis not present

## 2015-02-06 NOTE — Patient Instructions (Signed)
Medication Instructions:  Your physician recommends that you continue on your current medications as directed. Please refer to the Current Medication list given to you today.   Labwork: None ordered  Testing/Procedures: None ordered  Follow-Up: Your physician recommends that you keep your follow up appointment with Dr. Tamala Julian.   Any Other Special Instructions Will Be Listed Below (If Applicable).

## 2015-02-06 NOTE — Progress Notes (Signed)
02/06/2015 Justin Blackburn   Dec 08, 1950  [id number]  Primary Physician Leonides Sake, MD Primary Cardiologist: Dr Tamala Julian  HPI:  Pleasant 64 y/o obese male followed by Dr Tamala Julian for many years with a history of CAD- s/p CABG in '98, '07. Last cath 2013. He presented to the ED at [facility] 01/28/15 with complaints of DOE. He was found to be inb atrial flutter with 2:1 conduction. He was admitted for further evaluation. His Troponin was "elevated " (0.04). He underwent TEE CV 01/29/15 to NSR. His LVF was noted to be depressed at TEE. He is in the office today for follow up. Discharge notes suggest he may need coronary angiogram as an OP once compensated from CHF standpoint.   He feels well, no chest pain, palpitations, and his DOE has resolved.    Current Outpatient Prescriptions  Medication Sig Dispense Refill  . apixaban (ELIQUIS) 5 MG TABS tablet Take 1 tablet (5 mg total) by mouth 2 (two) times daily. 60 tablet 11  . carvedilol (COREG) 6.25 MG tablet Take 6.25 mg by mouth 2 (two) times daily with a meal.  1  . clopidogrel (PLAVIX) 75 MG tablet Take 75 mg by mouth daily.     . furosemide (LASIX) 40 MG tablet Take 1 tablet (40 mg total) by mouth daily. 30 tablet 3  . irbesartan (AVAPRO) 300 MG tablet Take 1 tablet (300 mg total) by mouth daily. 90 tablet 3  . LANTUS SOLOSTAR 100 UNIT/ML Solostar Pen Inject 75 Units into the skin daily.  3  . metFORMIN (GLUCOPHAGE) 1000 MG tablet Take 1,000 mg by mouth 2 (two) times daily.  0  . nitroGLYCERIN (NITROSTAT) 0.4 MG SL tablet Place 1 tablet (0.4 mg total) under the tongue every 5 (five) minutes as needed for chest pain (Angina). 25 tablet 3  . Olmesartan-Amlodipine-HCTZ (TRIBENZOR) 40-10-12.5 MG TABS Take 1 tablet by mouth daily.    . pravastatin (PRAVACHOL) 40 MG tablet Take 40 mg by mouth at bedtime.    . sitaGLIPtin (JANUVIA) 100 MG tablet Take 100 mg by mouth daily.     No current facility-administered medications for this visit.    No Known  Allergies  Social History   Social History  . Marital Status: Married    Spouse Name: N/A  . Number of Children: N/A  . Years of Education: N/A   Occupational History  . Not on file.   Social History Main Topics  . Smoking status: Former Smoker -- 2.00 packs/day for 6 years    Types: Cigarettes    Quit date: 06/16/1971  . Smokeless tobacco: Former Systems developer    Quit date: 06/15/1986     Comment: "chewed on cigars; never really did chew tobacco"  . Alcohol Use: No  . Drug Use: No  . Sexual Activity: No   Other Topics Concern  . Not on file   Social History Narrative     Review of Systems: General: negative for chills, fever, night sweats or weight changes.  Cardiovascular: negative for chest pain, dyspnea on exertion, edema, orthopnea, palpitations, paroxysmal nocturnal dyspnea or shortness of breath Dermatological: negative for rash Respiratory: negative for cough or wheezing Urologic: negative for hematuria Abdominal: negative for nausea, vomiting, diarrhea, bright red blood per rectum, melena, or hematemesis Neurologic: negative for visual changes, syncope, or dizziness All other systems reviewed and are otherwise negative except as noted above.    Blood pressure 113/70, pulse 64, weight 237 lb 3.2 oz (107.593 kg).  General appearance:  alert, cooperative, no distress and moderately obese Neck: no carotid bruit and no JVD Lungs: clear to auscultation bilaterally Heart: regular rate and rhythm Abdomen: soft, non-tender; bowel sounds normal; no masses,  no organomegaly and umbilical hernia noted Extremities: extremities normal, atraumatic, no cyanosis or edema Skin: Skin color, texture, turgor normal. No rashes or lesions Neurologic: Grossly normal  EKG NSR, IVCD, Q in V2  ASSESSMENT AND PLAN:   Acute on chronic combined systolic and diastolic heart failure Dyspnea has resolved  Atrial flutter with rapid ventricular response S/P TEE CV 01/29/15- holding  NSR  Anticoagulated Eliquis  Demand ischemia During recent admission as well as decreased LVF on TEE  Hx of CABG CABG 2003 and 2007. Grafts remaining patent on 2013 cath RIMA to RCA, LIMA to LAD. SVG to diagonal and SVG to RCA are occluded. No chest pain   PLAN  Will review with Dr Tamala Julian- ? If he needs a cath. If so would need to wait 4 weeks post cardioversion before stopping anticoagulation. Consider re evaluating his LVF first and cath only if still significantly depressed.   Kerin Ransom K PA-C 02/06/2015 2:49 PM

## 2015-02-06 NOTE — Assessment & Plan Note (Signed)
S/P TEE CV 01/29/15- holding NSR

## 2015-02-06 NOTE — Assessment & Plan Note (Signed)
CABG 2003 and 2007. Grafts remaining patent on 2013 cath RIMA to RCA, LIMA to LAD. SVG to diagonal and SVG to RCA are occluded. No chest pain

## 2015-02-06 NOTE — Assessment & Plan Note (Signed)
Dyspnea has resolved

## 2015-02-06 NOTE — Assessment & Plan Note (Signed)
Eliquis 

## 2015-02-06 NOTE — Assessment & Plan Note (Signed)
During recent admission as well as decreased LVF on TEE

## 2015-02-08 ENCOUNTER — Other Ambulatory Visit: Payer: Self-pay | Admitting: Cardiology

## 2015-02-08 DIAGNOSIS — I5043 Acute on chronic combined systolic (congestive) and diastolic (congestive) heart failure: Secondary | ICD-10-CM

## 2015-02-12 ENCOUNTER — Ambulatory Visit: Payer: PPO | Admitting: Cardiology

## 2015-02-27 ENCOUNTER — Ambulatory Visit (HOSPITAL_COMMUNITY): Payer: PPO | Attending: Cardiology

## 2015-02-27 ENCOUNTER — Other Ambulatory Visit: Payer: Self-pay

## 2015-02-27 DIAGNOSIS — I5043 Acute on chronic combined systolic (congestive) and diastolic (congestive) heart failure: Secondary | ICD-10-CM | POA: Diagnosis not present

## 2015-02-27 MED ORDER — PERFLUTREN LIPID MICROSPHERE
1.0000 mL | Freq: Once | INTRAVENOUS | Status: AC
  Administered 2015-02-27: 1 mL via INTRAVENOUS

## 2015-03-01 ENCOUNTER — Telehealth: Payer: Self-pay

## 2015-03-01 NOTE — Telephone Encounter (Signed)
Pt aware of Dr.Smith's response to pt echo results. Pt does not need cath just f/u Pt has an appt with Dr.Smith on 11/14. Pt sts that he asymptomatic and doing well. Adv pt to keep Nov appt, pt should call back sooner if symptoms develop. Pt agreeable with plan and verbalized understanding.

## 2015-03-01 NOTE — Telephone Encounter (Signed)
-----   Message from Isaiah Serge, NP sent at 02/28/2015  7:42 AM EDT ----- Please arrange f/u appt with Dr. Tamala Julian  Thanks   ----- Message -----    From: Belva Crome, MD    Sent: 02/27/2015   4:58 PM      To: Isaiah Serge, NP  No cath needed. Needs f/u with me. ----- Message -----    From: Isaiah Serge, NP    Sent: 02/27/2015   2:38 PM      To: Belva Crome, MD, Izell Vermillion Parris-Godley, CMA  Echo with improved EF, will have Dr. Tamala Julian review but unless he prefers cath then would just monitor for now.

## 2015-04-29 ENCOUNTER — Ambulatory Visit (INDEPENDENT_AMBULATORY_CARE_PROVIDER_SITE_OTHER): Payer: PPO | Admitting: Interventional Cardiology

## 2015-04-29 ENCOUNTER — Encounter: Payer: Self-pay | Admitting: Interventional Cardiology

## 2015-04-29 VITALS — BP 130/70 | HR 79 | Ht 71.0 in | Wt 237.0 lb

## 2015-04-29 DIAGNOSIS — I4892 Unspecified atrial flutter: Secondary | ICD-10-CM | POA: Diagnosis not present

## 2015-04-29 DIAGNOSIS — I5042 Chronic combined systolic (congestive) and diastolic (congestive) heart failure: Secondary | ICD-10-CM | POA: Diagnosis not present

## 2015-04-29 DIAGNOSIS — Z7901 Long term (current) use of anticoagulants: Secondary | ICD-10-CM

## 2015-04-29 DIAGNOSIS — I25719 Atherosclerosis of autologous vein coronary artery bypass graft(s) with unspecified angina pectoris: Secondary | ICD-10-CM

## 2015-04-29 DIAGNOSIS — I10 Essential (primary) hypertension: Secondary | ICD-10-CM

## 2015-04-29 MED ORDER — ASPIRIN EC 81 MG PO TBEC: 81.0000 mg | DELAYED_RELEASE_TABLET | Freq: Every day | ORAL | Status: DC

## 2015-04-29 NOTE — Progress Notes (Signed)
Cardiology Office Note   Date:  04/29/2015   ID:  Justin Blackburn, DOB Jan 04, 1951, MRN [id number]  PCP:  Leonides Sake, MD  Cardiologist:  [name], MD   Chief Complaint  Patient presents with  . Coronary Artery Disease      History of Present Illness: Justin Blackburn is a 64 y.o. male who presents for CAD with prior coronary bypass grafting, diabetes mellitus, combined chronic systolic and diastolic heart failure, paroxysmal atrial fibrillation, essential hypertension, and diabetes. Patient is on anticoagulation therapy. mellitus.  The patient is doing well. He is concerned that our current system opens care up to multiple providers. He states that this causes confusion.  He denies angina.  He has not had blood in the urine or stool.  His energy level remains normal. This was a major concern when he had atrial flutter.  Past Medical History  Diagnosis Date  . Umbilical hernia     unrepaired  . Myocardial infarction ([place]) [id number];  ~1996;~ 2000  . Angina   . Hypertension   . High cholesterol   . 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 Peripheral vascular disease ([place])   . Chronic lower back pain     "all the time from my sciatic nerve"  . Erectile dysfunction   . Hypercholesteremia   . Heart failure ([place])   . Obesity   . Atrial flutter, paroxysmal ([place])     arrived with aflutter with RVR, EF down to 25-30%, s/p TEE DCCV on 01/29/2015    Past Surgical History  Procedure Laterality Date  . Coronary angioplasty with stent placement  08/31/11    "2 today; makes total of 3 stents"  . Coronary artery bypass graft  1998; 2007    CABG X 4; CABG X4  . Left heart catheterization with coronary/graft angiogram N/A 08/31/2011    Procedure: LEFT HEART CATHETERIZATION WITH Beatrix Fetters;  Surgeon: [name], MD;  Location: [facility] CATH LAB;  Service: Cardiovascular;  Laterality: N/A;  . Tee without cardioversion N/A 01/29/2015   Procedure: TRANSESOPHAGEAL ECHOCARDIOGRAM (TEE);  Surgeon: [name], MD;  Location: [place];  Service: Cardiovascular;  Laterality: N/A;  . Cardioversion N/A 01/29/2015    Procedure: CARDIOVERSION;  Surgeon: [name], MD;  Location: [facility] ENDOSCOPY;  Service: Cardiovascular;  Laterality: N/A;     Current Outpatient Prescriptions  Medication Sig Dispense Refill  . apixaban (ELIQUIS) 5 MG TABS tablet Take 1 tablet (5 mg total) by mouth 2 (two) times daily. 60 tablet 11  . carvedilol (COREG) 6.25 MG tablet Take 6.25 mg by mouth 2 (two) times daily with a meal.  1  . clopidogrel (PLAVIX) 75 MG tablet Take 75 mg by mouth daily.     . furosemide (LASIX) 40 MG tablet Take 1 tablet (40 mg total) by mouth daily. 30 tablet 3  . LANTUS SOLOSTAR 100 UNIT/ML Solostar Pen Inject 75 Units into the skin daily.  3  . metFORMIN (GLUCOPHAGE) 1000 MG tablet Take 1,000 mg by mouth 2 (two) times daily.  0  . nitroGLYCERIN (NITROSTAT) 0.4 MG SL tablet Place 1 tablet (0.4 mg total) under the tongue every 5 (five) minutes as needed for chest pain (Angina). 25 tablet 3  . Olmesartan-Amlodipine-HCTZ (TRIBENZOR) 40-10-12.5 MG TABS Take 1 tablet by mouth daily.    . Olmesartan-Amlodipine-HCTZ (TRIBENZOR) 40-5-12.5 MG TABS Take 1 tablet by mouth daily.    . pravastatin (PRAVACHOL) 40 MG tablet Take 40 mg  by mouth at bedtime.    . sitaGLIPtin (JANUVIA) 100 MG tablet Take 100 mg by mouth daily.     No current facility-administered medications for this visit.    Allergies:   Review of patient's allergies indicates no known allergies.    Social History:  The patient  reports that he quit smoking about 43 years ago. His smoking use included Cigarettes. He has a 12 pack-year smoking history. He quit smokeless tobacco use about 28 years ago. He reports that he does not drink alcohol or use illicit drugs.   Family History:  The patient's family history includes Heart attack in his brother, father, and mother;  Heart disease in his father and mother. There is no history of Stroke.    ROS:  Please see the history of present illness.   Otherwise, review of systems are positive for musculoskeletal discomfort.   All other systems are reviewed and negative.    PHYSICAL EXAM: VS:  BP 130/70 mmHg  Pulse 79  Ht 5\' 11"  (1.803 m)  Wt 107.502 kg (237 lb)  BMI 33.07 kg/m2 , BMI Body mass index is 33.07 kg/(m^2). GEN: Well nourished, well developed, in no acute distress HEENT: normal Neck: no JVD, carotid bruits, or masses Cardiac: RRR.  There is no murmur, rub, or gallop. There is no edema. Respiratory:  clear to auscultation bilaterally, normal work of breathing. GI: soft, nontender, nondistended, + BS MS: no deformity or atrophy Skin: warm and dry, no rash Neuro:  Strength and sensation are intact Psych: euthymic mood, full affect   EKG:  EKG is not ordered today.    Recent Labs: 01/28/2015: BUN 20; Creatinine, Ser 1.00; Potassium 3.9; Sodium 137; TSH 1.419    Lipid Panel No results found for: CHOL, TRIG, HDL, CHOLHDL, VLDL, LDLCALC, LDLDIRECT    Wt Readings from Last 3 Encounters:  04/29/15 107.502 kg (237 lb)  02/06/15 107.593 kg (237 lb 3.2 oz)  01/29/15 108.5 kg (239 lb 3.2 oz)      Other studies Reviewed: Additional studies/ records that were reviewed today include: Hospital admission.. The findings include noted cardioversion from atrial flutter..    ASSESSMENT AND PLAN:  1. Coronary artery disease involving autologous vein coronary bypass graft with unspecified angina pectoris Asymptomatic and last percutaneous intervention was 2013.  2. Chronic combined systolic and diastolic heart failure, NYHA class 2 (HCC) No evidence of volume overload.  3. Atrial flutter with rapid ventricular response (HCC) Clinically in sinus rhythm and no evidence of exertional fatigue which was the problem when atrial fibrillation was present.  4. Anticoagulated No bleeding complications but  is high risk on both eloquent's and Plavix  5. Essential hypertension Excellent control    Current medicines are reviewed at length with the patient today.  The patient has the following concerns regarding medicines: None.  The following changes/actions have been instituted:    Discontinue Plavix  Aspirin 81 mg per day.  Monitor for bleeding.   EKG on return  Labs/ tests ordered today include:  No orders of the defined types were placed in this encounter.     Disposition:   FU with HS in 4 months  Signed, [name], MD  04/29/2015 8:05 AM    [facility] [place], [place], [place]  [address] Phone: [phone]; Fax: [phone]

## 2015-04-29 NOTE — Patient Instructions (Signed)
Medication Instructions:  Your physician has recommended you make the following change in your medication:  1) STOP Plavix 2) START Aspirin 81mg  daily   Labwork: None ordered  Testing/Procedures: None ordered  Follow-Up: Your physician wants you to follow-up in: 4 months with Dr.Smith You will receive a reminder letter in the mail two months in advance. If you don't receive a letter, please call our office to schedule the follow-up appointment.   Any Other Special Instructions Will Be Listed Below (If Applicable).     If you need a refill on your cardiac medications before your next appointment, please call your pharmacy.

## 2015-07-06 ENCOUNTER — Other Ambulatory Visit: Payer: Self-pay | Admitting: Physician Assistant

## 2015-07-08 NOTE — Telephone Encounter (Signed)
Please review for refill, Thank you. 

## 2015-08-14 ENCOUNTER — Encounter: Payer: Self-pay | Admitting: Interventional Cardiology

## 2015-08-14 ENCOUNTER — Other Ambulatory Visit: Payer: Self-pay | Admitting: Interventional Cardiology

## 2015-10-01 ENCOUNTER — Other Ambulatory Visit: Payer: Self-pay | Admitting: Physician Assistant

## 2015-10-01 NOTE — Telephone Encounter (Signed)
Please review for refill, Thank you. 

## 2015-10-02 NOTE — Telephone Encounter (Signed)
Rx request sent to pharmacy.  

## 2015-11-03 ENCOUNTER — Other Ambulatory Visit: Payer: Self-pay | Admitting: Interventional Cardiology

## 2015-11-25 ENCOUNTER — Other Ambulatory Visit: Payer: Self-pay

## 2015-11-25 ENCOUNTER — Other Ambulatory Visit: Payer: Self-pay | Admitting: Interventional Cardiology

## 2016-02-04 ENCOUNTER — Other Ambulatory Visit: Payer: Self-pay | Admitting: Physician Assistant

## 2016-02-05 NOTE — Telephone Encounter (Signed)
Please review for refill. Thanks!  

## 2016-02-06 ENCOUNTER — Other Ambulatory Visit: Payer: Self-pay | Admitting: Physician Assistant

## 2016-02-06 NOTE — Telephone Encounter (Signed)
Review for refill, Thank you. 

## 2016-02-12 NOTE — Telephone Encounter (Signed)
ELIQUIS 5 MG TABS tablet  Medication  Date: 02/07/2016 Department: [facility] Office Ordering/Authorizing: Belva Crome, MD  Order Providers   Prescribing Provider Encounter Provider  Belva Crome, MD Almyra Deforest, PA  Medication Detail    Disp Refills Start End   ELIQUIS 5 MG TABS tablet 60 tablet 2 02/07/2016    Sig - Route: TAKE 1 TABLET (5 MG TOTAL) BY MOUTH 2 (TWO) TIMES DAILY. - Oral   Notes to Pharmacy: Please call our office to schedule an yearly appointment with Dr. Tamala Julian for future refills. [phone]. Thank you 1st attempt   E-Prescribing Status: Receipt confirmed by pharmacy (02/07/2016 8:47 AM EDT)   Pharmacy   [id number] - [place], [place]

## 2016-03-06 ENCOUNTER — Other Ambulatory Visit: Payer: Self-pay | Admitting: Interventional Cardiology

## 2016-04-13 ENCOUNTER — Other Ambulatory Visit: Payer: Self-pay

## 2016-04-13 DIAGNOSIS — I739 Peripheral vascular disease, unspecified: Secondary | ICD-10-CM

## 2016-04-15 ENCOUNTER — Other Ambulatory Visit: Payer: Self-pay | Admitting: Physician Assistant

## 2016-04-15 ENCOUNTER — Ambulatory Visit (HOSPITAL_COMMUNITY)
Admission: RE | Admit: 2016-04-15 | Discharge: 2016-04-15 | Disposition: A | Discharge status: Home / Self Care | Payer: PPO | Source: Ambulatory Visit | Attending: Vascular Surgery | Admitting: Vascular Surgery

## 2016-04-15 ENCOUNTER — Ambulatory Visit
Admission: RE | Admit: 2016-04-15 | Discharge: 2016-04-15 | Disposition: A | Discharge status: Home / Self Care | Payer: PPO | Source: Ambulatory Visit | Attending: Physician Assistant | Admitting: Physician Assistant

## 2016-04-15 DIAGNOSIS — E1169 Type 2 diabetes mellitus with other specified complication: Principal | ICD-10-CM

## 2016-04-15 DIAGNOSIS — M869 Osteomyelitis, unspecified: Principal | ICD-10-CM

## 2016-04-15 DIAGNOSIS — E11621 Type 2 diabetes mellitus with foot ulcer: Secondary | ICD-10-CM

## 2016-04-15 DIAGNOSIS — I739 Peripheral vascular disease, unspecified: Secondary | ICD-10-CM | POA: Insufficient documentation

## 2016-04-15 DIAGNOSIS — L97509 Non-pressure chronic ulcer of other part of unspecified foot with unspecified severity: Principal | ICD-10-CM

## 2016-04-15 DIAGNOSIS — R938 Abnormal findings on diagnostic imaging of other specified body structures: Secondary | ICD-10-CM | POA: Diagnosis not present

## 2016-04-17 ENCOUNTER — Encounter: Payer: PPO | Admitting: Vascular Surgery

## 2016-04-21 ENCOUNTER — Encounter: Payer: Self-pay | Admitting: Vascular Surgery

## 2016-04-22 ENCOUNTER — Other Ambulatory Visit: Payer: Self-pay

## 2016-04-22 ENCOUNTER — Encounter: Payer: Self-pay | Admitting: Vascular Surgery

## 2016-04-22 ENCOUNTER — Ambulatory Visit (INDEPENDENT_AMBULATORY_CARE_PROVIDER_SITE_OTHER): Payer: PPO | Admitting: Vascular Surgery

## 2016-04-22 VITALS — BP 147/84 | HR 66 | Temp 97.7°F | Ht 71.0 in | Wt 233.0 lb

## 2016-04-22 DIAGNOSIS — I739 Peripheral vascular disease, unspecified: Secondary | ICD-10-CM | POA: Diagnosis not present

## 2016-04-22 NOTE — Progress Notes (Signed)
Patient name: Justin Blackburn MRN: [id number] DOB: Oct 05, 1950 Sex: male  REASON FOR CONSULT: Slowly healing ulcer with peripheral vascular disease. Referred by [name], PA.  HPI: Justin Blackburn is a 65 y.o. male, who developed some wounds on his right foot about a month ago. He does not remember any specific injury to the foot. He denies any significant claudication. I think his activities fairly limited however. He denies any rest pain. He does describe burning in his feet consistent with neuropathy.  His risk factors for peripheral vascular disease include diabetes, hypertension, hypercholesterolemia, and a family history of premature cardiovascular disease. He smoked 2 packs per day previously but quit in 1973.  He tells me that he has had 5 previous heart attacks. He had his first heart attack when he was age 58.  He is undergone coronary revascularization twice. He is followed by Dr. Daneen Schick. He is on Eliquis because of his history of atrial flutter I believe.  Past Medical History:  Diagnosis Date  . Angina   . Atrial flutter, paroxysmal ([place])    arrived with aflutter with RVR, EF down to 25-30%, s/p TEE DCCV on 01/29/2015  . Chronic lower back pain    "all the time from my sciatic nerve"  . Erectile dysfunction   . Heart failure ([place])   . High cholesterol   . Hypercholesteremia   . Hypertension   . 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 Myocardial infarction 1988; ~1992;  ~1996;~ 2000  . Obesity   . Peripheral vascular disease ([place])   . Umbilical hernia    unrepaired    Family History  Problem Relation Age of Onset  . Heart disease Mother   . Heart attack Mother   . Heart disease Father   . Heart attack Father   . Heart attack Brother   . Stroke Neg Hx     SOCIAL HISTORY: Social History   Social History  . Marital status: Married    Spouse name: N/A  . Number of children: N/A  . Years of education: N/A   Occupational History  .  Not on file.   Social History Main Topics  . Smoking status: Former Smoker    Packs/day: 2.00    Years: 6.00    Types: Cigarettes    Quit date: 06/16/1971  . Smokeless tobacco: Former Systems developer    Quit date: 06/15/1986     Comment: "chewed on cigars; never really did chew tobacco"  . Alcohol use No  . Drug use: No  . Sexual activity: No   Other Topics Concern  . Not on file   Social History Narrative  . No narrative on file    No Known Allergies  Current Outpatient Prescriptions  Medication Sig Dispense Refill  . aspirin EC 81 MG tablet Take 1 tablet (81 mg total) by mouth daily.    . carvedilol (COREG) 6.25 MG tablet Take 6.25 mg by mouth 2 (two) times daily with a meal.  1  . ELIQUIS 5 MG TABS tablet TAKE 1 TABLET (5 MG TOTAL) BY MOUTH 2 (TWO) TIMES DAILY. 60 tablet 2  . furosemide (LASIX) 40 MG tablet TAKE 1 TABLET (40 MG TOTAL) BY MOUTH DAILY. PLEASE SCHEDULE APPOINTMENT FOR REFILLS. 30 tablet 4  . LANTUS SOLOSTAR 100 UNIT/ML Solostar Pen Inject 75 Units into the skin daily.  3  . metFORMIN (GLUCOPHAGE) 1000 MG tablet Take 1,000 mg by mouth 2 (two) times daily.  0  . nitroGLYCERIN (NITROSTAT) 0.4 MG SL tablet Place 1 tablet (0.4 mg total) under the tongue every 5 (five) minutes as needed for chest pain (Angina). 25 tablet 3  . Olmesartan-Amlodipine-HCTZ 40-5-12.5 MG TABS TAKE 1 TABLET BY MOUTH EVERY DAY. APPT NEEDED FOR REFILLS 30 tablet 1  . pravastatin (PRAVACHOL) 40 MG tablet Take 40 mg by mouth at bedtime.    . sitaGLIPtin (JANUVIA) 100 MG tablet Take 100 mg by mouth daily.     No current facility-administered medications for this visit.     REVIEW OF SYSTEMS:  [X]  denotes positive finding, [ ]  denotes negative finding Cardiac  Comments:  Chest pain or chest pressure:    Shortness of breath upon exertion:    Short of breath when lying flat:    Irregular heart rhythm:        Vascular    Pain in calf, thigh, or hip brought on by ambulation: X   Pain in feet at night  that wakes you up from your sleep:     Blood clot in your veins:    Leg swelling:         Pulmonary    Oxygen at home:    Productive cough:     Wheezing:         Neurologic    Sudden weakness in arms or legs:     Sudden numbness in arms or legs:     Sudden onset of difficulty speaking or slurred speech:    Temporary loss of vision in one eye:     Problems with dizziness:         Gastrointestinal    Blood in stool:     Vomited blood:         Genitourinary    Burning when urinating:     Blood in urine:        Psychiatric    Major depression:         Hematologic    Bleeding problems:    Problems with blood clotting too easily:        Skin    Rashes or ulcers:        Constitutional    Fever or chills:      PHYSICAL EXAM: Vitals:   04/22/16 1307 04/22/16 1310  BP: (!) 159/82 (!) 147/84  Pulse: 66 66  Temp: 97.7 F (36.5 C)   SpO2: 97%   Weight: 233 lb (105.7 kg)   Height: 5\' 11"  (1.803 m)     GENERAL: The patient is a well-nourished male, in no acute distress. The vital signs are documented above. CARDIAC: There is a regular rate and rhythm.  VASCULAR: I do not detect carotid bruits. On the right side, which is the symptomatic side, he has a palpable femoral pulse. I cannot palpate popliteal or pedal pulses. He has a biphasic posterior tibial signal and a monophasic dorsalis pedis signal on the right. On the left side he has a palpable femoral pulse. I cannot palpate popliteal or pedal pulses. He has a biphasic dorsalis pedis and posterior tibial signal on the left. The left foot is swollen. PULMONARY: There is good air exchange bilaterally without wheezing or rales. ABDOMEN: Soft and non-tender with normal pitched bowel sounds.  MUSCULOSKELETAL: There are no major deformities or cyanosis. NEUROLOGIC: No focal weakness or paresthesias are detected. SKIN: He has an open ulcer on the lateral aspect of his right fourth toe. He also has an open wound about a  centimeter  in diameter on the dorsum of his right second toe. There is swelling associated with this and some surrounding cellulitis. PSYCHIATRIC: The patient has a normal affect.  DATA:   ARTERIAL DOPPLER STUDY: I have independently interpreted his arterial Doppler study that was done on 04/15/2017.  On the right side there is a triphasic posterior tibial signal with a monophasic anterior tibial signal. ABI is > 100%.   On the left side there is a biphasic dorsalis pedis and posterior tibial signal. ABI is greater than 100%.  MEDICAL ISSUES:  PVD WITH NONHEALING ULCERS RIGHT FOOT: I cannot palpate pulses in the right foot. I think the ABI is falsely elevated because of calcific disease. He has a monophasic dorsalis pedis signal I suspect he has significant infrainguinal arterial occlusive disease. Given his diabetes, ulcers on the right foot, and peripheral vascular disease, this is clearly a limb threatening situation. I recommended that we proceed with arteriography.  I have reviewed with the patient the indications for arteriography. In addition, I have reviewed the potential complications of arteriography including but not limited to: Bleeding, arterial injury, arterial thrombosis, dye action, renal insufficiency, or other unpredictable medical problems. I have explained to the patient that if we find disease amenable to angioplasty we could potentially address this at the same time. I have discussed the potential complications of angioplasty and stenting, including but not limited to: Bleeding, arterial thrombosis, arterial injury, dissection, or the need for surgical intervention.  Hopefully his arteriogram will show that he has adequate circulation for healing of these wounds. If he were to require a bypass, he may have limited vein options as he has undergone CABG twice. In addition before any surgery he would require preoperative cardiac clearance by Dr. Tamala Julian.  I'll make further  recommendations pending the results of his arteriogram.  We will hold his Eliquis for 48 hours prior to the procedure and then restart this after the procedure.  Deitra Mayo [facility] [phone]

## 2016-04-27 ENCOUNTER — Ambulatory Visit (HOSPITAL_COMMUNITY)
Admission: RE | Admit: 2016-04-27 | Discharge: 2016-04-27 | Disposition: A | Discharge status: Home / Self Care | Payer: PPO | Source: Ambulatory Visit | Attending: Vascular Surgery | Admitting: Vascular Surgery

## 2016-04-27 ENCOUNTER — Encounter (HOSPITAL_COMMUNITY)
Admission: RE | Disposition: A | Discharge status: Home / Self Care | Payer: Self-pay | Source: Ambulatory Visit | Attending: Vascular Surgery

## 2016-04-27 DIAGNOSIS — L97519 Non-pressure chronic ulcer of other part of right foot with unspecified severity: Secondary | ICD-10-CM | POA: Insufficient documentation

## 2016-04-27 DIAGNOSIS — Z7982 Long term (current) use of aspirin: Secondary | ICD-10-CM | POA: Diagnosis not present

## 2016-04-27 DIAGNOSIS — I110 Hypertensive heart disease with heart failure: Secondary | ICD-10-CM | POA: Insufficient documentation

## 2016-04-27 DIAGNOSIS — E7800 Pure hypercholesterolemia, unspecified: Secondary | ICD-10-CM | POA: Insufficient documentation

## 2016-04-27 DIAGNOSIS — E1151 Type 2 diabetes mellitus with diabetic peripheral angiopathy without gangrene: Secondary | ICD-10-CM | POA: Diagnosis not present

## 2016-04-27 DIAGNOSIS — Z8249 Family history of ischemic heart disease and other diseases of the circulatory system: Secondary | ICD-10-CM | POA: Insufficient documentation

## 2016-04-27 DIAGNOSIS — I70235 Atherosclerosis of native arteries of right leg with ulceration of other part of foot: Secondary | ICD-10-CM | POA: Diagnosis not present

## 2016-04-27 DIAGNOSIS — I252 Old myocardial infarction: Secondary | ICD-10-CM | POA: Diagnosis not present

## 2016-04-27 DIAGNOSIS — E11621 Type 2 diabetes mellitus with foot ulcer: Secondary | ICD-10-CM | POA: Insufficient documentation

## 2016-04-27 DIAGNOSIS — Z87891 Personal history of nicotine dependence: Secondary | ICD-10-CM | POA: Diagnosis not present

## 2016-04-27 DIAGNOSIS — Z794 Long term (current) use of insulin: Secondary | ICD-10-CM | POA: Diagnosis not present

## 2016-04-27 DIAGNOSIS — I509 Heart failure, unspecified: Secondary | ICD-10-CM | POA: Diagnosis not present

## 2016-04-27 DIAGNOSIS — Z79899 Other long term (current) drug therapy: Secondary | ICD-10-CM | POA: Insufficient documentation

## 2016-04-27 DIAGNOSIS — Z7901 Long term (current) use of anticoagulants: Secondary | ICD-10-CM | POA: Diagnosis not present

## 2016-04-27 HISTORY — PX: PERIPHERAL VASCULAR CATHETERIZATION: SHX172C

## 2016-04-27 LAB — GLUCOSE, CAPILLARY: GLUCOSE-CAPILLARY: 150 mg/dL — AB (ref 65–99)

## 2016-04-27 LAB — POCT I-STAT, CHEM 8
BUN: 13 mg/dL (ref 6–20)
Calcium, Ion: 1.11 mmol/L — ABNORMAL LOW (ref 1.15–1.40)
Chloride: 104 mmol/L (ref 101–111)
Creatinine, Ser: 0.9 mg/dL (ref 0.61–1.24)
Glucose, Bld: 160 mg/dL — ABNORMAL HIGH (ref 65–99)
HEMATOCRIT: 42 % (ref 39.0–52.0)
HEMOGLOBIN: 14.3 g/dL (ref 13.0–17.0)
Potassium: 3.7 mmol/L (ref 3.5–5.1)
SODIUM: 140 mmol/L (ref 135–145)
TCO2: 25 mmol/L (ref 0–100)

## 2016-04-27 SURGERY — ABDOMINAL AORTOGRAM W/LOWER EXTREMITY

## 2016-04-27 MED ORDER — MIDAZOLAM HCL 2 MG/2ML IJ SOLN
INTRAMUSCULAR | Status: AC
  Filled 2016-04-27: qty 2

## 2016-04-27 MED ORDER — FENTANYL CITRATE (PF) 100 MCG/2ML IJ SOLN
INTRAMUSCULAR | Status: AC
  Filled 2016-04-27: qty 2

## 2016-04-27 MED ORDER — LIDOCAINE HCL (PF) 1 % IJ SOLN
INTRAMUSCULAR | Status: AC
  Filled 2016-04-27: qty 30

## 2016-04-27 MED ORDER — FENTANYL CITRATE (PF) 100 MCG/2ML IJ SOLN
INTRAMUSCULAR | Status: DC | PRN
  Administered 2016-04-27: 50 ug via INTRAVENOUS

## 2016-04-27 MED ORDER — ACETAMINOPHEN 325 MG PO TABS
650.0000 mg | ORAL_TABLET | ORAL | Status: DC | PRN
Start: 2016-04-27 — End: 2016-04-27

## 2016-04-27 MED ORDER — ONDANSETRON HCL 4 MG/2ML IJ SOLN: 4.0000 mg | Freq: Four times a day (QID) | INTRAMUSCULAR | Status: DC | PRN

## 2016-04-27 MED ORDER — HEPARIN (PORCINE) IN NACL 2-0.9 UNIT/ML-% IJ SOLN
INTRAMUSCULAR | Status: DC | PRN
  Administered 2016-04-27: 1000 mL

## 2016-04-27 MED ORDER — SODIUM CHLORIDE 0.9 % IV SOLN
INTRAVENOUS | Status: DC
Start: 2016-04-27 — End: 2016-04-27
  Administered 2016-04-27: 09:00:00 via INTRAVENOUS

## 2016-04-27 MED ORDER — SODIUM CHLORIDE 0.9 % IV SOLN: 1.0000 mL/kg/h | INTRAVENOUS | Status: DC

## 2016-04-27 MED ORDER — LIDOCAINE HCL (PF) 1 % IJ SOLN
INTRAMUSCULAR | Status: DC | PRN
  Administered 2016-04-27: 12 mL

## 2016-04-27 MED ORDER — IODIXANOL 320 MG/ML IV SOLN
INTRAVENOUS | Status: DC | PRN
  Administered 2016-04-27: 177 mL via INTRA_ARTERIAL

## 2016-04-27 MED ORDER — MIDAZOLAM HCL 2 MG/2ML IJ SOLN
INTRAMUSCULAR | Status: DC | PRN
  Administered 2016-04-27: 1 mg via INTRAVENOUS

## 2016-04-27 MED ORDER — HEPARIN (PORCINE) IN NACL 2-0.9 UNIT/ML-% IJ SOLN
INTRAMUSCULAR | Status: AC
  Filled 2016-04-27: qty 1000

## 2016-04-27 SURGICAL SUPPLY — 8 items
CATH ANGIO 5F PIGTAIL 65CM (CATHETERS) ×1 IMPLANT
KIT MICROINTRODUCER STIFF 5F (SHEATH) ×1 IMPLANT
KIT PV (KITS) ×2 IMPLANT
SHEATH PINNACLE 5F 10CM (SHEATH) ×1 IMPLANT
SYR MEDRAD MARK V 150ML (SYRINGE) ×2 IMPLANT
TRANSDUCER W/STOPCOCK (MISCELLANEOUS) ×2 IMPLANT
TRAY PV CATH (CUSTOM PROCEDURE TRAY) ×2 IMPLANT
WIRE BENTSON .035X145CM (WIRE) ×1 IMPLANT

## 2016-04-27 NOTE — Progress Notes (Signed)
5Fr sheath aspirated and removed from LFA, manual pressure applied for 20 minutes. No s+s of hematoma, groin level 0. Tegaderm dressing applied, bedrest instructions given.   Distal pulses present dp and pt bilaterally with doppler.   Bedrest begins at 12:15:00

## 2016-04-27 NOTE — Discharge Instructions (Signed)
Angiogram, Care After °Refer to this sheet in the next few weeks. These instructions provide you with information about caring for yourself after your procedure. Your health care provider may also give you more specific instructions. Your treatment has been planned according to current medical practices, but problems sometimes occur. Call your health care provider if you have any problems or questions after your procedure. °WHAT TO EXPECT AFTER THE PROCEDURE °After your procedure, it is typical to have the following: °· Bruising at the catheter insertion site that usually fades within 1-2 weeks. °· Blood collecting in the tissue (hematoma) that may be painful to the touch. It should usually decrease in size and tenderness within 1-2 weeks. °HOME CARE INSTRUCTIONS °· Take medicines only as directed by your health care provider. °· You may shower 24-48 hours after the procedure or as directed by your health care provider. Remove the bandage (dressing) and gently wash the site with plain soap and water. Pat the area dry with a clean towel. Do not rub the site, because this may cause bleeding. °· Do not take baths, swim, or use a hot tub until your health care provider approves. °· Check your insertion site every day for redness, swelling, or drainage. °· Do not apply powder or lotion to the site. °· Do not lift over 10 lb (4.5 kg) for 5 days after your procedure or as directed by your health care provider. °· Ask your health care provider when it is okay to: °¨ Return to work or school. °¨ Resume usual physical activities or sports. °¨ Resume sexual activity. °· Do not drive home if you are discharged the same day as the procedure. Have someone else drive you. °· You may drive 24 hours after the procedure unless otherwise instructed by your health care provider. °· Do not operate machinery or power tools for 24 hours after the procedure or as directed by your health care provider. °· If your procedure was done as an  outpatient procedure, which means that you went home the same day as your procedure, a responsible adult should be with you for the first 24 hours after you arrive home. °· Keep all follow-up visits as directed by your health care provider. This is important. °SEEK MEDICAL CARE IF: °· You have a fever. °· You have chills. °· You have increased bleeding from the catheter insertion site. Hold pressure on the site.  CALL 911 °SEEK IMMEDIATE MEDICAL CARE IF: °· You have unusual pain at the catheter insertion site. °· You have redness, warmth, or swelling at the catheter insertion site. °· You have drainage (other than a small amount of blood on the dressing) from the catheter insertion site. °· The catheter insertion site is bleeding, and the bleeding does not stop after 30 minutes of holding steady pressure on the site. °· The area near or just beyond the catheter insertion site becomes pale, cool, tingly, or numb. °  °This information is not intended to replace advice given to you by your health care provider. Make sure you discuss any questions you have with your health care provider. °  °Document Released: 12/18/2004 Document Revised: 06/22/2014 Document Reviewed: 11/02/2012 °Elsevier Interactive Patient Education ©2016 [employer] ° °

## 2016-04-27 NOTE — Interval H&P Note (Signed)
History and Physical Interval Note:  04/27/2016 10:41 AM  Justin Blackburn  has presented today for surgery, with the diagnosis of pvd with right foot ulcer  The various methods of treatment have been discussed with the patient and family. After consideration of risks, benefits and other options for treatment, the patient has consented to  Procedure(s): Abdominal Aortogram w/Lower Extremity (N/A) as a surgical intervention .  The patient's history has been reviewed, patient examined, no change in status, stable for surgery.  I have reviewed the patient's chart and labs.  Questions were answered to the patient's satisfaction.     Deitra Mayo

## 2016-04-27 NOTE — Op Note (Signed)
   PATIENT: Justin Blackburn   MRN: [id number] DOB: 09-Oct-1950    DATE OF PROCEDURE: 04/27/2016  INDICATIONS: [name] is a 65 y.o. male who presented with a nonhealing ulcer on the right foot and significant venous insufficiency. 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  PROCEDURE:  1. Ultrasound-guided access to the left common femoral artery 2. Aortogram with bilateral iliac arteriogram and bilateral lower extremity runoff  SURGEON: Judeth Cornfield. Scot Dock, MD, FACS  ANESTHESIA: Local with sedation   EBL: Minimal  TECHNIQUE: The patient was taken to the peripheral vascular lab and was sedated.The period of conscious sedation was 34 minutes. 1 mg of Versed and 50 g of fentanyl.  During that time period, I was present face-to-face 100% of the time.  The patient was administered (1 mg of Versed and 50 g of fentanyl.). The patient's heart rate, blood pressure, and oxygen saturation were monitored by the nurse continuously during the procedure.  Both groins were prepped and draped in usual sterile fashion. Under ultrasound guidance, after the skin was anesthetized, the left common femoral artery was cannulated with a micropuncture needle and a micropuncture sheath was introduced over wire. This was exchanged for a 5 French sheath over a versa core wire. A pigtail catheter was positioned at the L1 vertebral body and flush aortogram obtained. The catheter was positioned above the aortic bifurcation and an oblique iliac projection was obtained. Next bilateral lower extremity runoff films were obtained. The catheter was removed over a wire. The patient was transferred to the holding area for removal of the sheath. No immediate competitions were noted.   FINDINGS:  1. There are single renal arteries bilaterally with no significant renal artery stenosis identified. The infrarenal aorta, bilateral common iliac arteries, bilateral external iliac arteries, and  bilateral hypogastric arteries are patent. 2. On the right side, which is the side of concern, the common femoral, deep femoral, superficial femoral, and popliteal arteries are patent. The anterior tibial artery on the right is occluded. The posterior tibial and peroneal arteries are patent. There is some reconstitution of the dorsalis pedis distally on the right. 3. On the left side, the common femoral, superficial femoral, and popliteal arteries are patent. The anterior tibial artery is occluded at the origin. There is two-vessel runoff on the left of the posterior tibial and peroneal arteries.  CLINICAL NOTE: He should have adequate circulation to heal the wound on the right foot.  Deitra Mayo, MD, FACS [facility]  DATE OF DICTATION:   04/27/2016

## 2016-04-27 NOTE — H&P (View-Only) (Signed)
Patient name: Justin Blackburn MRN: [id number] DOB: 11-09-1950 Sex: male  REASON FOR CONSULT: Slowly healing ulcer with peripheral vascular disease. Referred by [name], PA.  HPI: Justin Blackburn is a 65 y.o. male, who developed some wounds on his right foot about a month ago. He does not remember any specific injury to the foot. He denies any significant claudication. I think his activities fairly limited however. He denies any rest pain. He does describe burning in his feet consistent with neuropathy.  His risk factors for peripheral vascular disease include diabetes, hypertension, hypercholesterolemia, and a family history of premature cardiovascular disease. He smoked 2 packs per day previously but quit in 1973.  He tells me that he has had 5 previous heart attacks. He had his first heart attack when he was age 37.  He is undergone coronary revascularization twice. He is followed by Dr. Daneen Schick. He is on Eliquis because of his history of atrial flutter I believe.  Past Medical History:  Diagnosis Date  . Angina   . Atrial flutter, paroxysmal ([place])    arrived with aflutter with RVR, EF down to 25-30%, s/p TEE DCCV on 01/29/2015  . Chronic lower back pain    "all the time from my sciatic nerve"  . Erectile dysfunction   . Heart failure ([place])   . High cholesterol   . Hypercholesteremia   . Hypertension   . 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 Myocardial infarction 1988; ~1992;  ~1996;~ 2000  . Obesity   . Peripheral vascular disease ([place])   . Umbilical hernia    unrepaired    Family History  Problem Relation Age of Onset  . Heart disease Mother   . Heart attack Mother   . Heart disease Father   . Heart attack Father   . Heart attack Brother   . Stroke Neg Hx     SOCIAL HISTORY: Social History   Social History  . Marital status: Married    Spouse name: N/A  . Number of children: N/A  . Years of education: N/A   Occupational History  .  Not on file.   Social History Main Topics  . Smoking status: Former Smoker    Packs/day: 2.00    Years: 6.00    Types: Cigarettes    Quit date: 06/16/1971  . Smokeless tobacco: Former Systems developer    Quit date: 06/15/1986     Comment: "chewed on cigars; never really did chew tobacco"  . Alcohol use No  . Drug use: No  . Sexual activity: No   Other Topics Concern  . Not on file   Social History Narrative  . No narrative on file    No Known Allergies  Current Outpatient Prescriptions  Medication Sig Dispense Refill  . aspirin EC 81 MG tablet Take 1 tablet (81 mg total) by mouth daily.    . carvedilol (COREG) 6.25 MG tablet Take 6.25 mg by mouth 2 (two) times daily with a meal.  1  . ELIQUIS 5 MG TABS tablet TAKE 1 TABLET (5 MG TOTAL) BY MOUTH 2 (TWO) TIMES DAILY. 60 tablet 2  . furosemide (LASIX) 40 MG tablet TAKE 1 TABLET (40 MG TOTAL) BY MOUTH DAILY. PLEASE SCHEDULE APPOINTMENT FOR REFILLS. 30 tablet 4  . LANTUS SOLOSTAR 100 UNIT/ML Solostar Pen Inject 75 Units into the skin daily.  3  . metFORMIN (GLUCOPHAGE) 1000 MG tablet Take 1,000 mg by mouth 2 (two) times daily.  0  . nitroGLYCERIN (NITROSTAT) 0.4 MG SL tablet Place 1 tablet (0.4 mg total) under the tongue every 5 (five) minutes as needed for chest pain (Angina). 25 tablet 3  . Olmesartan-Amlodipine-HCTZ 40-5-12.5 MG TABS TAKE 1 TABLET BY MOUTH EVERY DAY. APPT NEEDED FOR REFILLS 30 tablet 1  . pravastatin (PRAVACHOL) 40 MG tablet Take 40 mg by mouth at bedtime.    . sitaGLIPtin (JANUVIA) 100 MG tablet Take 100 mg by mouth daily.     No current facility-administered medications for this visit.     REVIEW OF SYSTEMS:  [X]  denotes positive finding, [ ]  denotes negative finding Cardiac  Comments:  Chest pain or chest pressure:    Shortness of breath upon exertion:    Short of breath when lying flat:    Irregular heart rhythm:        Vascular    Pain in calf, thigh, or hip brought on by ambulation: X   Pain in feet at night  that wakes you up from your sleep:     Blood clot in your veins:    Leg swelling:         Pulmonary    Oxygen at home:    Productive cough:     Wheezing:         Neurologic    Sudden weakness in arms or legs:     Sudden numbness in arms or legs:     Sudden onset of difficulty speaking or slurred speech:    Temporary loss of vision in one eye:     Problems with dizziness:         Gastrointestinal    Blood in stool:     Vomited blood:         Genitourinary    Burning when urinating:     Blood in urine:        Psychiatric    Major depression:         Hematologic    Bleeding problems:    Problems with blood clotting too easily:        Skin    Rashes or ulcers:        Constitutional    Fever or chills:      PHYSICAL EXAM: Vitals:   04/22/16 1307 04/22/16 1310  BP: (!) 159/82 (!) 147/84  Pulse: 66 66  Temp: 97.7 F (36.5 C)   SpO2: 97%   Weight: 233 lb (105.7 kg)   Height: 5\' 11"  (1.803 m)     GENERAL: The patient is a well-nourished male, in no acute distress. The vital signs are documented above. CARDIAC: There is a regular rate and rhythm.  VASCULAR: I do not detect carotid bruits. On the right side, which is the symptomatic side, he has a palpable femoral pulse. I cannot palpate popliteal or pedal pulses. He has a biphasic posterior tibial signal and a monophasic dorsalis pedis signal on the right. On the left side he has a palpable femoral pulse. I cannot palpate popliteal or pedal pulses. He has a biphasic dorsalis pedis and posterior tibial signal on the left. The left foot is swollen. PULMONARY: There is good air exchange bilaterally without wheezing or rales. ABDOMEN: Soft and non-tender with normal pitched bowel sounds.  MUSCULOSKELETAL: There are no major deformities or cyanosis. NEUROLOGIC: No focal weakness or paresthesias are detected. SKIN: He has an open ulcer on the lateral aspect of his right fourth toe. He also has an open wound about a  centimeter  in diameter on the dorsum of his right second toe. There is swelling associated with this and some surrounding cellulitis. PSYCHIATRIC: The patient has a normal affect.  DATA:   ARTERIAL DOPPLER STUDY: I have independently interpreted his arterial Doppler study that was done on 04/15/2017.  On the right side there is a triphasic posterior tibial signal with a monophasic anterior tibial signal. ABI is > 100%.   On the left side there is a biphasic dorsalis pedis and posterior tibial signal. ABI is greater than 100%.  MEDICAL ISSUES:  PVD WITH NONHEALING ULCERS RIGHT FOOT: I cannot palpate pulses in the right foot. I think the ABI is falsely elevated because of calcific disease. He has a monophasic dorsalis pedis signal I suspect he has significant infrainguinal arterial occlusive disease. Given his diabetes, ulcers on the right foot, and peripheral vascular disease, this is clearly a limb threatening situation. I recommended that we proceed with arteriography.  I have reviewed with the patient the indications for arteriography. In addition, I have reviewed the potential complications of arteriography including but not limited to: Bleeding, arterial injury, arterial thrombosis, dye action, renal insufficiency, or other unpredictable medical problems. I have explained to the patient that if we find disease amenable to angioplasty we could potentially address this at the same time. I have discussed the potential complications of angioplasty and stenting, including but not limited to: Bleeding, arterial thrombosis, arterial injury, dissection, or the need for surgical intervention.  Hopefully his arteriogram will show that he has adequate circulation for healing of these wounds. If he were to require a bypass, he may have limited vein options as he has undergone CABG twice. In addition before any surgery he would require preoperative cardiac clearance by Dr. Tamala Julian.  I'll make further  recommendations pending the results of his arteriogram.  We will hold his Eliquis for 48 hours prior to the procedure and then restart this after the procedure.  Deitra Mayo [facility] [phone]

## 2016-04-28 ENCOUNTER — Encounter (HOSPITAL_COMMUNITY): Payer: Self-pay | Admitting: Vascular Surgery

## 2016-04-29 ENCOUNTER — Other Ambulatory Visit: Payer: Self-pay | Admitting: *Deleted

## 2016-04-29 MED ORDER — OLMESARTAN-AMLODIPINE-HCTZ 40-5-12.5 MG PO TABS: ORAL_TABLET | ORAL | 0 refills | Status: DC

## 2016-04-29 NOTE — Telephone Encounter (Signed)
Olmesartan-Amlodipine-HCTZ 40-5-12.5 MG TABS  Medication  Date: 03/09/2016 Department: [facility] Ordering/Authorizing: Belva Crome, MD  Order Providers   Prescribing Provider Encounter Provider  Belva Crome, MD Belva Crome, MD  Medication Detail    Disp Refills Start End   Olmesartan-Amlodipine-HCTZ 40-5-12.5 MG TABS 30 tablet 1 03/09/2016    Sig: TAKE 1 TABLET BY MOUTH EVERY DAY. APPT NEEDED FOR REFILLS   Notes to Pharmacy: Please call our office to schedule an yearly appointment for November for future refills. [phone]. Thank you   E-Prescribing Status: Receipt confirmed by pharmacy (03/09/2016 8:37 AM EDT)   Pharmacy   [id number] - [place], [place]

## 2016-05-06 ENCOUNTER — Telehealth: Payer: Self-pay | Admitting: Radiology

## 2016-05-06 ENCOUNTER — Ambulatory Visit (INDEPENDENT_AMBULATORY_CARE_PROVIDER_SITE_OTHER): Payer: PPO | Admitting: Orthopedic Surgery

## 2016-05-06 ENCOUNTER — Other Ambulatory Visit (INDEPENDENT_AMBULATORY_CARE_PROVIDER_SITE_OTHER): Payer: Self-pay | Admitting: Orthopedic Surgery

## 2016-05-06 ENCOUNTER — Encounter (INDEPENDENT_AMBULATORY_CARE_PROVIDER_SITE_OTHER): Payer: Self-pay | Admitting: Orthopedic Surgery

## 2016-05-06 ENCOUNTER — Ambulatory Visit (INDEPENDENT_AMBULATORY_CARE_PROVIDER_SITE_OTHER): Payer: PPO

## 2016-05-06 VITALS — BP 143/70 | HR 68 | Resp 16 | Ht 71.0 in | Wt 230.0 lb

## 2016-05-06 DIAGNOSIS — M79671 Pain in right foot: Secondary | ICD-10-CM | POA: Diagnosis not present

## 2016-05-06 DIAGNOSIS — Z77018 Contact with and (suspected) exposure to other hazardous metals: Secondary | ICD-10-CM

## 2016-05-06 NOTE — Telephone Encounter (Signed)
[facility] can hold a spot for Tuesday for his MRI, this has to be approved by his insurance first he has a [employer] managed care plan, Gabriel Cirri is working on the Prior auth now

## 2016-05-06 NOTE — Progress Notes (Signed)
Office Visit Note   Patient: Justin Blackburn           Date of Birth: 1950/09/23           MRN: [id number] Visit Date: 05/06/2016              Requested by: Leonides Sake, MD [place], [place] [address] PCP: Leonides Sake, MD   Assessment & Plan: Visit Diagnoses:  1. Pain in right foot     Plan:  #1: The toe was dressed with mupirocin and a Band-Aid. #2: Continue antibiotics #3: MRI scan of the foot for evaluation of the fourth toe as well as the midfoot for infective process #4: Return after MRI which is to be scheduled early next week #5: I discussed with him that if any changes occur with his foot with worsening drainage, redness, pain, or symptoms he is to be seen in the emergency room for probable emergent scan and surgery. He is understanding of this  Follow-Up Instructions: Return in about 1 week (around 05/13/2016) for review of mri.   Orders:  Orders Placed This Encounter  Procedures  . Anaerobic culture  . XR Toe 2nd Right  . MR FOOT RIGHT W WO CONTRAST   No orders of the defined types were placed in this encounter.     Procedures: No procedures performed   Culture was obtained of the granulomatous tissue   Clinical Data: No additional findings.   Subjective: Chief Complaint  Patient presents with  . Right 2nd Toe - Open Wound    Justin Blackburn is a 65 y.o. male, who developed some wounds on his right second toe about 2 months ago. He does not remember any specific injury to the foot. He denies any significant claudication. He denies any rest pain. He does describe burning in his feet consistent with neuropathy. He has been seen by [name] PA-C and has been treated for the ulceration proud flesh lesion of his second toe PIP joint. Does not reveal any history of injury or trauma to this area. He was placed on clindamycin which wasn't 600 mg 3 times a day. This unfortunately is causing nausea. He has decreased the amounts. He comes  in today for evaluation of the toe.  On 04/15/2016 X-ray studies of the RIGHT FOOT COMPLETE - 3+ VIEW Hallux valgus deformity is noted. The known soft tissue ulcer is noted over the second PIP joint. Some bony destruction and fragmentation is noted at the distal aspect of the second proximal phalanx consistent with osteomyelitis. No other fracture or soft tissue abnormality is noted.  He has been seen by Dr. Doren Custard at the vein vascular and they aortogram was performed with peripheral runoff showing ability to heal a surgical debridement.        Review of Systems  Constitutional: Negative.   HENT: Negative.   Eyes: Negative.   Cardiovascular:       History of atrial flutter on eliqus. Also on Plavix.  Gastrointestinal: Negative.   Endocrine:       Diabetic on metformin and Januvia  Genitourinary: Negative.   Allergic/Immunologic: Negative.   Neurological: Negative.   Psychiatric/Behavioral: Negative.      Objective: Vital Signs: BP (!) 143/70 (BP Location: Left Arm, Patient Position: Sitting, Cuff Size: Large)   Pulse 68   Resp 16   Ht 5\' 11"  (1.803 m)   Wt 230 lb (104.3 kg)   BMI 32.08 kg/m   Physical Exam  Constitutional: He is oriented to person, place, and time. He appears well-developed and well-nourished.  HENT:  Head: Normocephalic and atraumatic.  Eyes: EOM are normal. Pupils are equal, round, and reactive to light.  Neck:  No carotid bruits  Pulmonary/Chest: Effort normal.  Neurological: He is alert and oriented to person, place, and time.  Skin: There is erythema ( Second and fourth toe worse than the remaining dorsum of the foot).  Psychiatric: He has a normal mood and affect. His behavior is normal. Judgment and thought content normal.        Right Ankle Exam   Comments:  The right second toe has approximately an 8-10 mm bolus lesion of proud flesh with some serous drainage. He is not particularly painful to palpation of the area. He does have a  claw toe deformity at the PIP joint. Edema diffusely about the foot little worse on the second toe and the fourth toe. He denies pain on the fourth toe. Capillary refill is less than 2 seconds. Sensation is diminished.      Specialty Comments:  No specialty comments available.  Imaging: Xr Toe 2nd Right  Result Date: 05/06/2016 Three-view x-rays right foot reveals subluxation of the middle phalanx plantar to the head of the proximal phalanx. Proximal phalanx head is no longer appears to be attached to the proximal phalanx. His appears to be more of an osteomyelitis.    PMFS History: Patient Active Problem List   Diagnosis Date Noted  . Anticoagulated 02/06/2015  . Acute on chronic combined systolic and diastolic heart failure ([place]) 01/30/2015  . Demand ischemia ([place]) 01/30/2015  . Atrial flutter with rapid ventricular response ([place]) 01/28/2015  . Chronic combined systolic and diastolic heart failure, NYHA class 2 ([place]) 04/17/2013  . Umbilical hernia   . Myocardial infarction   . Peripheral vascular disease ([place])   . Hypercholesteremia   . 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 Hypertension 09/01/2011  . Diabetes mellitus ([place]) 09/01/2011   Past Medical History:  Diagnosis Date  . Angina   . Atrial flutter, paroxysmal ([place])    arrived with aflutter with RVR, EF down to 25-30%, s/p TEE DCCV on 01/29/2015  . Chronic lower back pain    "all the time from my sciatic nerve"  . Erectile dysfunction   . Heart failure ([place])   . High cholesterol   . Hypercholesteremia   . Hypertension   . 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 Myocardial infarction 1988; ~1992;  ~1996;~ 2000  . Obesity   . Peripheral vascular disease ([place])   . Umbilical hernia    unrepaired    Family History  Problem Relation Age of Onset  . Heart disease Mother   . Heart attack Mother   . Heart disease Father   . Heart attack Father   . Heart attack Brother   . Stroke Neg Hx     Past Surgical  History:  Procedure Laterality Date  . CARDIOVERSION N/A 01/29/2015   Procedure: CARDIOVERSION;  Surgeon: [name], MD;  Location: [facility];  Service: Cardiovascular;  Laterality: N/A;  . CORONARY ANGIOPLASTY WITH STENT PLACEMENT  08/31/11   "2 today; makes total of 3 stents"  . CORONARY ARTERY BYPASS GRAFT  1998; 2007   CABG X 4; CABG X4  . LEFT HEART CATHETERIZATION WITH CORONARY/GRAFT ANGIOGRAM N/A 08/31/2011   Procedure: LEFT HEART CATHETERIZATION WITH Beatrix Fetters;  Surgeon: [name], MD;  Location: [facility] CATH LAB;  Service:  Cardiovascular;  Laterality: N/A;  . PERIPHERAL VASCULAR CATHETERIZATION N/A 04/27/2016   Procedure: Abdominal Aortogram w/Lower Extremity;  Surgeon: Angelia Mould, MD;  Location: [facility] CV LAB;  Service: Cardiovascular;  Laterality: N/A;  . TEE WITHOUT CARDIOVERSION N/A 01/29/2015   Procedure: TRANSESOPHAGEAL ECHOCARDIOGRAM (TEE);  Surgeon: [name], MD;  Location: [facility];  Service: Cardiovascular;  Laterality: N/A;   Social History   Occupational History  . Not on file.   Social History Main Topics  . Smoking status: Former Smoker    Packs/day: 2.00    Years: 6.00    Types: Cigarettes    Quit date: 06/16/1971  . Smokeless tobacco: Former Systems developer    Quit date: 06/15/1986     Comment: "chewed on cigars; never really did chew tobacco"  . Alcohol use No  . Drug use: No  . Sexual activity: No

## 2016-05-09 ENCOUNTER — Other Ambulatory Visit: Payer: Self-pay | Admitting: Interventional Cardiology

## 2016-05-12 ENCOUNTER — Other Ambulatory Visit: Payer: PPO

## 2016-05-12 ENCOUNTER — Ambulatory Visit
Admission: RE | Admit: 2016-05-12 | Discharge: 2016-05-12 | Disposition: A | Discharge status: Home / Self Care | Payer: PPO | Source: Ambulatory Visit | Attending: Orthopedic Surgery | Admitting: Orthopedic Surgery

## 2016-05-12 DIAGNOSIS — Z77018 Contact with and (suspected) exposure to other hazardous metals: Secondary | ICD-10-CM

## 2016-05-12 DIAGNOSIS — M79671 Pain in right foot: Secondary | ICD-10-CM

## 2016-05-12 MED ORDER — GADOBENATE DIMEGLUMINE 529 MG/ML IV SOLN
20.0000 mL | Freq: Once | INTRAVENOUS | Status: AC | PRN
  Administered 2016-05-12: 20 mL via INTRAVENOUS

## 2016-05-14 LAB — WOUND CULTURE
GRAM STAIN: NONE SEEN
GRAM STAIN: NONE SEEN

## 2016-05-16 ENCOUNTER — Other Ambulatory Visit: Payer: Self-pay | Admitting: Interventional Cardiology

## 2016-05-16 ENCOUNTER — Other Ambulatory Visit: Payer: Self-pay | Admitting: Physician Assistant

## 2016-05-18 NOTE — Telephone Encounter (Signed)
Please review for refill. Thanks!  

## 2016-05-21 ENCOUNTER — Ambulatory Visit (INDEPENDENT_AMBULATORY_CARE_PROVIDER_SITE_OTHER): Payer: PPO | Admitting: Orthopaedic Surgery

## 2016-05-21 ENCOUNTER — Encounter (INDEPENDENT_AMBULATORY_CARE_PROVIDER_SITE_OTHER): Payer: Self-pay | Admitting: Orthopaedic Surgery

## 2016-05-21 VITALS — BP 152/80 | HR 73 | Ht 71.0 in | Wt 230.0 lb

## 2016-05-21 DIAGNOSIS — L97514 Non-pressure chronic ulcer of other part of right foot with necrosis of bone: Secondary | ICD-10-CM | POA: Diagnosis not present

## 2016-05-21 DIAGNOSIS — M86271 Subacute osteomyelitis, right ankle and foot: Secondary | ICD-10-CM | POA: Diagnosis not present

## 2016-05-21 MED ORDER — DOXYCYCLINE HYCLATE 100 MG PO CAPS: 100.0000 mg | ORAL_CAPSULE | Freq: Two times a day (BID) | ORAL | 0 refills | Status: DC

## 2016-05-21 NOTE — Progress Notes (Signed)
Office Visit Note   Patient: Justin Blackburn           Date of Birth: 1950-11-18           MRN: [id number] Visit Date: 05/21/2016              Requested by: Leonides Sake, MD [place], [place] [address] PCP: Leonides Sake, MD   Assessment & Plan: Visit Diagnoses:  1. Subacute osteomyelitis, right ankle and foot ([place])   2. 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 Meyer Cory feels that because it is certainly improved that we should try to allow this to improve. We are going to switch him from clindamycin over to doxycycline. He will continue with the Betadine water soaks as well as the mupirocin. Call in the interim if he has any problems.  Follow-Up Instructions: Return in about 10 days (around 05/31/2016).   Orders:  No orders of the defined types were placed in this encounter.  Meds ordered this encounter  Medications  . doxycycline (VIBRAMYCIN) 100 MG capsule    Sig: Take 1 capsule (100 mg total) by mouth 2 (two) times daily.    Dispense:  60 capsule    Refill:  0    Order Specific Question:   Supervising Provider    Answer:   Garald Balding [[id number]]      Procedures: No procedures performed   Clinical Data: No additional findings.   Subjective: No chief complaint on file.   Justin Blackburn is a 65 y.o. male, who developed a wound  on his right second toe PIP about 2-3 months ago. He does not remember any specific injury to the foot. He denies any significant claudication. He denies any rest pain. He does describe burning in his feet consistent with neuropathy. He has been seen by [name] PA-C and has been treated for the ulceration proud flesh lesion of his second toe PIP joint. Does not reveal any history of injury or trauma to this area. He was placed on clindamycin which was 600 mg 3 times a day. This unfortunately is causing nausea. He had decreased the amounts.   On 04/15/2016 X-ray studies of  the RIGHT FOOT COMPLETE - 3+ VIEW Hallux valgus deformity is noted. The known soft tissue ulcer is noted over the second PIP joint. Some bony destruction and fragmentation is noted at the distal aspect of the second proximal phalanx consistent with osteomyelitis. No other fracture or soft tissue abnormality is noted.  He has been seen by Dr. Doren Custard at the [facility] and they aortogram was performed with peripheral r on unoff showing ability to heal a surgical debridement.  He was treated from his initial appointment on 05/06/2016 with twice daily soaks with warm water and a small amount of enough. He then tried that very well and placed the mupirocin with a Band-Aid. He states he feels this is certainly improving and the swelling is decreasing. MRI scan was ordered he returns today for review and reevaluation.    Review of Systems  Constitutional: Negative.   HENT: Negative.   Eyes: Negative.   Cardiovascular:       History of atrial flutter on eliqus. Also on Plavix.  Gastrointestinal: Negative.   Endocrine:       Diabetic on metformin and Januvia  Genitourinary: Negative.   Allergic/Immunologic: Negative.   Neurological: Negative.   Psychiatric/Behavioral: Negative.  Objective: Vital Signs: BP (!) 152/80   Pulse 73   Ht 5\' 11"  (1.803 m)   Wt 230 lb (104.3 kg)   BMI 32.08 kg/m   Physical Exam  Constitutional: He is oriented to person, place, and time. He appears well-developed and well-nourished.  HENT:  Head: Normocephalic and atraumatic.  Eyes: EOM are normal. Pupils are equal, round, and reactive to light.  Neck:  No carotid bruits  Pulmonary/Chest: Effort normal.  Neurological: He is alert and oriented to person, place, and time.  Skin: There is erythema ( Second and fourth toe worse than the remaining dorsum of the foot).  Psychiatric: He has a normal mood and affect. His behavior is normal. Judgment and thought content normal.    Ortho Exam Today the  fourth toe is less swollen and less erythema. The second toe which has an ulcer over the DIP had scabbing which was removed revealing a clean bed of granulation tissue. This certainly looked much improved from the last time I saw it. No pain to palpation.   Specialty Comments:  No specialty comments available.  Imaging: EXAM: MRI OF THE RIGHT FOREFOOT WITHOUT AND WITH CONTRAST  TECHNIQUE: Multiplanar, multisequence MR imaging of the right forefoot was performed before and after the administration of intravenous contrast.  CONTRAST:  48mL MULTIHANCE GADOBENATE DIMEGLUMINE 529 MG/ML IV SOLN  COMPARISON:  04/15/2016  FINDINGS: Bones/Joint/Cartilage  Abnormal osseous edema and enhancement in the proximal and middle phalanges of the second toe with probable resorption of the head of the proximal phalanx, as well as fluid signal continuity between the interphalangeal joint and the dorsal skin on image 12/8, potentially reflecting septic joint with drainage to the scan. There may be some bony fragments in the joint on image 12/8.  Abnormal edema in the head and distal shaft of the third metatarsal observed with some flattening along the inferior margin of the metatarsal head, suspicious for osteomyelitis.  Abnormal edema and enhancement in the proximal phalanx of the fourth digit and proximally in the middle phalanx of the fourth digit, suspicious for osteomyelitis.  Arthropathy at the first MTP joint with multiple small degenerative subcortical foci of edema and spurring. This is more likely to be due to degenerative and potentially superimposed inflammatory arthropathy. There is nonspecific edema in the medial sesamoid of the first digit compatible with sesamoiditis.  Degenerative subcortical cystic lesions are noted at the second tarsometatarsal articulation.  Ligaments  Lisfranc ligament intact  Muscles and Tendons  Low-level edema and enhancement in the  interosseous muscles around the third metatarsal head. This may reflect secondary inflammation/ myositis. Mild tenosynovitis of the second digit flexor tendons.  Soft tissues  Subcutaneous edema along the dorsum of the foot with little if any enhancement. No well-defined drainable abscess. Mild intermetatarsal bursitis in the first, second, and third intermetatarsal spaces.  IMPRESSION: 1. 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 Bony destruction of the head of the proximal phalanx possibly with bony fragments in the joint. 2. Suspected osteomyelitis involving the third metatarsal head and adjacent shaft. Suspected osteomyelitis involving the proximal and middle phalanges of the fourth toe. 3. Arthropathy at the first MTP joint likely combination of degenerative and erosive arthropathy. Nonspecific edema in the adjacent medial sesamoid is compatible with sesamoiditis. 4. Myositis in the interosseous muscles around the third metatarsal distally. 5. Mild tenosynovitis of the second digit flexor tendons. 6.  Mild intermetatarsal bursitis between the first, second, third, and fourth metatarsal heads.   Electronically Signed   By: Van Clines M.D.   On: 05/13/2016 10:00      PMFS History: Patient Active Problem List   Diagnosis Date Noted  . Anticoagulated 02/06/2015  . Acute on chronic combined systolic and diastolic heart failure ([place]) 01/30/2015  . Demand ischemia ([place]) 01/30/2015  . Atrial flutter with rapid ventricular response ([place]) 01/28/2015  . Chronic combined systolic and diastolic heart failure, NYHA class 2 ([place]) 04/17/2013  . Umbilical hernia   . Myocardial infarction   . Peripheral vascular disease ([place])   . Hypercholesteremia   . 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 Hypertension 09/01/2011  . Diabetes mellitus ([place])  09/01/2011   Past Medical History:  Diagnosis Date  . Angina   . Atrial flutter, paroxysmal ([place])    arrived with aflutter with RVR, EF down to 25-30%, s/p TEE DCCV on 01/29/2015  . Chronic lower back pain    "all the time from my sciatic nerve"  . Erectile dysfunction   . Heart failure ([place])   . High cholesterol   . Hypercholesteremia   . Hypertension   . 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 Myocardial infarction 1988; ~1992;  ~1996;~ 2000  . Obesity   . Peripheral vascular disease ([place])   . Umbilical hernia    unrepaired    Family History  Problem Relation Age of Onset  . Heart disease Mother   . Heart attack Mother   . Heart disease Father   . Heart attack Father   . Heart attack Brother   . Stroke Neg Hx     Past Surgical History:  Procedure Laterality Date  . CARDIOVERSION N/A 01/29/2015   Procedure: CARDIOVERSION;  Surgeon: [name], MD;  Location: [facility];  Service: Cardiovascular;  Laterality: N/A;  . CORONARY ANGIOPLASTY WITH STENT PLACEMENT  08/31/11   "2 today; makes total of 3 stents"  . CORONARY ARTERY BYPASS GRAFT  1998; 2007   CABG X 4; CABG X4  . LEFT HEART CATHETERIZATION WITH CORONARY/GRAFT ANGIOGRAM N/A 08/31/2011   Procedure: LEFT HEART CATHETERIZATION WITH Beatrix Fetters;  Surgeon: [name], MD;  Location: [facility] CATH LAB;  Service: Cardiovascular;  Laterality: N/A;  . PERIPHERAL VASCULAR CATHETERIZATION N/A 04/27/2016   Procedure: Abdominal Aortogram w/Lower Extremity;  Surgeon: Angelia Mould, MD;  Location: [facility] LAB;  Service: Cardiovascular;  Laterality: N/A;  . TEE WITHOUT CARDIOVERSION N/A 01/29/2015   Procedure: TRANSESOPHAGEAL ECHOCARDIOGRAM (TEE);  Surgeon: [name], MD;  Location: [facility];  Service: Cardiovascular;  Laterality: N/A;   Social History   Occupational History  . Not on file.   Social History Main Topics  . Smoking status: Former Smoker     Packs/day: 2.00    Years: 6.00    Types: Cigarettes    Quit date: 06/16/1971  . Smokeless tobacco: Former Systems developer    Quit date: 06/15/1986     Comment: "chewed on cigars; never really did chew tobacco"  . Alcohol use No  . Drug use: No  . Sexual activity: No

## 2016-05-25 ENCOUNTER — Other Ambulatory Visit: Payer: Self-pay | Admitting: Interventional Cardiology

## 2016-06-01 ENCOUNTER — Other Ambulatory Visit: Payer: Self-pay | Admitting: Physician Assistant

## 2016-06-01 NOTE — Telephone Encounter (Signed)
Review for refill. 

## 2016-06-09 ENCOUNTER — Other Ambulatory Visit: Payer: Self-pay | Admitting: Interventional Cardiology

## 2016-06-16 ENCOUNTER — Other Ambulatory Visit: Payer: Self-pay | Admitting: Physician Assistant

## 2016-06-16 NOTE — Telephone Encounter (Signed)
Please review for refill.  

## 2016-06-22 ENCOUNTER — Ambulatory Visit (INDEPENDENT_AMBULATORY_CARE_PROVIDER_SITE_OTHER): Payer: Medicare HMO | Admitting: Orthopaedic Surgery

## 2016-06-22 VITALS — Ht 71.0 in | Wt 230.0 lb

## 2016-06-22 DIAGNOSIS — L97512 Non-pressure chronic ulcer of other part of right foot with fat layer exposed: Secondary | ICD-10-CM

## 2016-06-22 NOTE — Progress Notes (Signed)
Office Visit Note   Patient: Justin Blackburn           Date of Birth: 07-Jan-1951           MRN: [id number] Visit Date: 06/22/2016              Requested by: Leonides Sake, MD [place], [place] [address] PCP: Leonides Sake, MD   Assessment & Plan: Visit Diagnoses: Diabetes mellitus with peripheral vascular disease. Ulceration of right second and fourth toes.   Plan: Elizebeth Koller course of doxycycline 100 mg twice a day and to second and fourth right toes. Mupropicin to  ulcerated areas daily. Follow-up in 10 days. Referral to [facility] for consideration of extra-depth shoes Follow-Up Instructions:   Orders:  No orders of the defined types were placed in this encounter.  No orders of the defined types were placed in this encounter.     Procedures: No procedures performed   Clinical Data: No additional findings.   Subjective: No chief complaint on file.   Pt Right foot, 2nd toe is doing better, still swollen, using Mupirocin.  Also his 4th toe has a hole on side. Added 2 x 2 to aleve some swelling...   Justin Blackburn has been followed for the diabetic ulcer on the dorsum of the right second toe. He notes  "it is doing better. He denies any fever or chills. He has developed some swelling of the fourth toe since his last office visit.  He has had a recent vascular study demonstrating relation of the right anterior tibial artery but with enough circulation to heal any wounds of his right foot."  Review of Systems   Objective: Vital Signs: Ht 5\' 11"  (1.803 m)   Wt 230 lb (104.3 kg)   BMI 32.08 kg/m   Physical Exam  Ortho Exam right foot was warm. Good capillary refill to all toes.Ulceration at the level of the PIP joint second toe has completely epithelialized. No drainage. The toe is still somewhat swollen with good capillary refill . There is no swelling of the fourth toe with a non-draining sinus laterally at the level of the PIP joint. No evidence of  necrotic tissue.  Specialty Comments:  No specialty comments available.  Imaging: No results found.   PMFS History: Patient Active Problem List   Diagnosis Date Noted  . Anticoagulated 02/06/2015  . Acute on chronic combined systolic and diastolic heart failure ([place]) 01/30/2015  . Demand ischemia ([place]) 01/30/2015  . Atrial flutter with rapid ventricular response ([place]) 01/28/2015  . Chronic combined systolic and diastolic heart failure, NYHA class 2 ([place]) 04/17/2013  . Umbilical hernia   . Myocardial infarction   . Peripheral vascular disease ([place])   . Hypercholesteremia   . 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 Hypertension 09/01/2011  . Diabetes mellitus ([place]) 09/01/2011   Past Medical History:  Diagnosis Date  . Angina   . Atrial flutter, paroxysmal ([place])    arrived with aflutter with RVR, EF down to 25-30%, s/p TEE DCCV on 01/29/2015  . Chronic lower back pain    "all the time from my sciatic nerve"  . Erectile dysfunction   . Heart failure ([place])   . High cholesterol   . Hypercholesteremia   . Hypertension   . 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 Myocardial infarction 1988; ~1992;  ~1996;~ 2000  . Obesity   . Peripheral vascular disease ([place])   . Umbilical hernia  unrepaired    Family History  Problem Relation Age of Onset  . Heart disease Mother   . Heart attack Mother   . Heart disease Father   . Heart attack Father   . Heart attack Brother   . Stroke Neg Hx     Past Surgical History:  Procedure Laterality Date  . CARDIOVERSION N/A 01/29/2015   Procedure: CARDIOVERSION;  Surgeon: [name], MD;  Location: [facility];  Service: Cardiovascular;  Laterality: N/A;  . CORONARY ANGIOPLASTY WITH STENT PLACEMENT  08/31/11   "2 today; makes total of 3 stents"  . CORONARY ARTERY BYPASS GRAFT  1998; 2007   CABG X 4; CABG X4  . LEFT HEART CATHETERIZATION WITH CORONARY/GRAFT ANGIOGRAM N/A 08/31/2011   Procedure: LEFT HEART CATHETERIZATION WITH  Beatrix Fetters;  Surgeon: [name], MD;  [facility] C[facility];  Service: Cardiovascular;  Laterality: N/A;  . PERIPHERAL VASCULAR CATHETERIZATION N/A 04/27/2016   Procedure: Abdominal Aortogram w/Lower Extremity;  Surgeon: Angelia Mould, MD;  Location: [facility] CV LAB;  Service: Cardiovascular;  Laterality: N/A;  . TEE WITHOUT CARDIOVERSION N/A 01/29/2015   Procedure: TRANSESOPHAGEAL ECHOCARDIOGRAM (TEE);  Surgeon: [name], MD;  Location: [facility];  Service: Cardiovascular;  Laterality: N/A;   Social History   Occupational History  . Not on file.   Social History Main Topics  . Smoking status: Former Smoker    Packs/day: 2.00    Years: 6.00    Types: Cigarettes    Quit date: 06/16/1971  . Smokeless tobacco: Former Systems developer    Quit date: 06/15/1986     Comment: "chewed on cigars; never really did chew tobacco"  . Alcohol use No  . Drug use: No  . Sexual activity: No

## 2016-06-28 ENCOUNTER — Other Ambulatory Visit: Payer: Self-pay | Admitting: Interventional Cardiology

## 2016-06-29 NOTE — Progress Notes (Signed)
  Diabetes mellitus with peripheral vascular disease. Ulceration of right second and fourth toes\COmpleted course of Doxycycline, Mupropicin to ulcerated area daily. Pt went to [facility] for extra depth shoe

## 2016-06-30 ENCOUNTER — Encounter (INDEPENDENT_AMBULATORY_CARE_PROVIDER_SITE_OTHER): Payer: Self-pay | Admitting: Orthopaedic Surgery

## 2016-06-30 ENCOUNTER — Ambulatory Visit (INDEPENDENT_AMBULATORY_CARE_PROVIDER_SITE_OTHER): Payer: Medicare HMO | Admitting: Orthopaedic Surgery

## 2016-06-30 VITALS — BP 153/84 | HR 72 | Ht 71.0 in | Wt 230.0 lb

## 2016-06-30 DIAGNOSIS — L97511 Non-pressure chronic ulcer of other part of right foot limited to breakdown of skin: Secondary | ICD-10-CM

## 2016-06-30 DIAGNOSIS — I739 Peripheral vascular disease, unspecified: Secondary | ICD-10-CM

## 2016-06-30 MED ORDER — DOXYCYCLINE HYCLATE 100 MG PO CAPS
100.0000 mg | ORAL_CAPSULE | Freq: Two times a day (BID) | ORAL | 0 refills | Status: DC
Start: 2016-06-30 — End: 2016-08-28

## 2016-06-30 NOTE — Telephone Encounter (Signed)
Defer to PCP.  Thanks.

## 2016-06-30 NOTE — Progress Notes (Deleted)
Office Visit Note   Patient: Justin Blackburn           Date of Birth: 1950-10-25           MRN: [id number] Visit Date: 06/30/2016              Requested by: Leonides Sake, MD [place], [place] [address] PCP: Leonides Sake, MD   Assessment & Plan: Visit Diagnoses:  1. Peripheral vascular disease ([place])     Plan: ***  Follow-Up Instructions: No Follow-up on file.   Orders:  No orders of the defined types were placed in this encounter.  No orders of the defined types were placed in this encounter.     Procedures: No procedures performed   Clinical Data: No additional findings.   Subjective: Chief Complaint  Patient presents with  . Right 2nd Toe - Wound Check  . Right 4th Toe - Wound Check    HPI  Review of Systems   Objective: Vital Signs: BP (!) 153/84   Pulse 72   Ht 5\' 11"  (1.803 m)   Wt 230 lb (104.3 kg)   BMI 32.08 kg/m   Physical Exam  Ortho Exam  Specialty Comments:  No specialty comments available.  Imaging: No results found.   PMFS History: Patient Active Problem List   Diagnosis Date Noted  . Anticoagulated 02/06/2015  . Acute on chronic combined systolic and diastolic heart failure ([place]) 01/30/2015  . Demand ischemia ([place]) 01/30/2015  . Atrial flutter with rapid ventricular response ([place]) 01/28/2015  . Chronic combined systolic and diastolic heart failure, NYHA class 2 ([place]) 04/17/2013  . Umbilical hernia   . Myocardial infarction   . Peripheral vascular disease ([place])   . Hypercholesteremia   . 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 Hypertension 09/01/2011  . Diabetes mellitus ([place]) 09/01/2011   Past Medical History:  Diagnosis Date  . Angina   . Atrial flutter, paroxysmal ([place])    arrived with aflutter with RVR, EF down to 25-30%, s/p TEE DCCV on 01/29/2015  . Chronic lower back pain    "all the time from my sciatic nerve"  . Erectile dysfunction   . Heart failure ([place])   . High cholesterol   .  Hypercholesteremia   . Hypertension   . 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 Myocardial infarction 1988; ~1992;  ~1996;~ 2000  . Obesity   . Peripheral vascular disease ([place])   . Umbilical hernia    unrepaired    Family History  Problem Relation Age of Onset  . Heart disease Mother   . Heart attack Mother   . Heart disease Father   . Heart attack Father   . Heart attack Brother   . Stroke Neg Hx     Past Surgical History:  Procedure Laterality Date  . CARDIOVERSION N/A 01/29/2015   Procedure: CARDIOVERSION;  Surgeon: [name], MD;  Location: [facility];  Service: Cardiovascular;  Laterality: N/A;  . CORONARY ANGIOPLASTY WITH STENT PLACEMENT  08/31/11   "2 today; makes total of 3 stents"  . CORONARY ARTERY BYPASS GRAFT  1998; 2007   CABG X 4; CABG X4  . LEFT HEART CATHETERIZATION WITH CORONARY/GRAFT ANGIOGRAM N/A 08/31/2011   Procedure: LEFT HEART CATHETERIZATION WITH Beatrix Fetters;  Surgeon: [name], MD;  Location: [facility] CATH LAB;  Service: Cardiovascular;  Laterality: N/A;  . PERIPHERAL VASCULAR CATHETERIZATION N/A 04/27/2016   Procedure: Abdominal Aortogram w/Lower Extremity;  Surgeon:  Angelia Mould, MD;  Location: [facility] CV LAB;  Service: Cardiovascular;  Laterality: N/A;  . TEE WITHOUT CARDIOVERSION N/A 01/29/2015   Procedure: TRANSESOPHAGEAL ECHOCARDIOGRAM (TEE);  Surgeon: [name], MD;  Location: [facility];  Service: Cardiovascular;  Laterality: N/A;   Social History   Occupational History  . Not on file.   Social History Main Topics  . Smoking status: Former Smoker    Packs/day: 2.00    Years: 6.00    Types: Cigarettes    Quit date: 06/16/1971  . Smokeless tobacco: Former Systems developer    Quit date: 06/15/1986     Comment: "chewed on cigars; never really did chew tobacco"  . Alcohol use No  . Drug use: No  . Sexual activity: No

## 2016-06-30 NOTE — Progress Notes (Signed)
Office Visit Note   Patient: Justin Blackburn           Date of Birth: 1950/10/18           MRN: [id number] Visit Date: 06/30/2016              Requested by: Leonides Sake, MD [place], [place] [address] PCP: Leonides Sake, MD   Assessment & Plan: Visit Diagnoses:  1. Peripheral vascular disease ([place])   2. Foot ulceration, right, limited to breakdown of skin ([place])     Plan: #1: Continue doxycycline for the next 30 days #2:  continue mupirocin to the fourth toe #3: Follow-up in 2 weeks   Follow-Up Instructions: Return in about 2 weeks (around 07/14/2016).   Orders:  No orders of the defined types were placed in this encounter.  Meds ordered this encounter  Medications  . doxycycline (VIBRAMYCIN) 100 MG capsule    Sig: Take 1 capsule (100 mg total) by mouth 2 (two) times daily.    Dispense:  60 capsule    Refill:  0    Order Specific Question:   Supervising Provider    Answer:   Garald Balding [[id number]]      Procedures: No procedures performed   Clinical Data: Findings:  Exam today of the right foot reveals second toe ulceration to be completely healed no drainage. Little bit of erythema but this is mild. Ears normal considering his foot. Toe does have little bit of a bottle 1 mm ulceration on the lateral aspect at the PIP joint. Noted there. Particularly painful. Refill is a less than 2 seconds.    Subjective: Chief Complaint  Patient presents with  . Right 2nd Toe - Wound Check  . Right 4th Toe - Wound Check    Justin Blackburn is a 66 y.o. male, who developed a wound  on his right second toe PIP about 2-3 months ago. He does not remember any specific injury to the foot. He denies any significant claudication. He denies any rest pain. He does describe burning in his feet consistent with neuropathy. He has been seen by [name] PA-C and has been treated for the ulceration proud flesh lesion of his second toe PIP joint. Does not reveal any  history of injury or trauma to this area. He was placed on clindamycin which was 600 mg 3 times a day. This unfortunately is causing nausea. He had decreased the amounts.   On 04/15/2016 X-ray studies of the RIGHT FOOT COMPLETE - 3+ VIEW Hallux valgus deformity is noted. The known soft tissue ulcer is noted over the second PIP joint. Some bony destruction and fragmentation is noted at the distal aspect of the second proximal phalanx consistent with osteomyelitis. No other fracture or soft tissue abnormality is noted.  He has been seen by Dr. Doren Custard at the vein vascular and they aortogram was performed with peripheral r on unoff showing ability to heal a surgical debridement.  He was treated from his initial appointment on 05/06/2016 with twice daily soaks with warm water and a small amount of enough. He then tried that very well and placed the mupirocin with a Band-Aid. He states he feels this is certainly improving and the swelling is decreasing. MRI scan was ordered he returns today for review and reevaluation.  MRI revealed 05/12/2016 1. 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 Bony destruction of the head of the proximal phalanx possibly with bony fragments in the joint. 2. Suspected osteomyelitis involving the third metatarsal head and adjacent shaft. Suspected osteomyelitis involving the proximal and middle phalanges of the fourth toe. 3. Arthropathy at the first MTP joint likely combination of degenerative and erosive arthropathy. Nonspecific edema in the adjacent medial sesamoid is compatible with sesamoiditis. 4. Myositis in the interosseous muscles around the third metatarsal distally. 5. Mild tenosynovitis of the second digit flexor tendons. 6. Mild intermetatarsal bursitis between the first, second, third, and fourth metatarsal heads.  He had been  switched to doxycycline and has finished his last dosage yesterday. He comes in today stating he certainly is improved. HEENT for reevaluation.       Review of Systems  Constitutional: Negative.   HENT: Negative.   Eyes: Negative.   Cardiovascular:       History of atrial flutter on eliqus. Also on Plavix.  Gastrointestinal: Negative.   Endocrine:       Diabetic on metformin and Januvia  Genitourinary: Negative.   Allergic/Immunologic: Negative.   Neurological: Negative.   Psychiatric/Behavioral: Negative.      Objective: Vital Signs: BP (!) 153/84   Pulse 72   Ht 5\' 11"  (1.803 m)   Wt 230 lb (104.3 kg)   BMI 32.08 kg/m   Physical Exam  Constitutional: He is oriented to person, place, and time. He appears well-developed and well-nourished.  HENT:  Head: Normocephalic and atraumatic.  Eyes: EOM are normal. Pupils are equal, round, and reactive to light.  Pulmonary/Chest: Effort normal.  Neurological: He is alert and oriented to person, place, and time.  Skin: There is erythema ( Second and fourth toe worse than the remaining dorsum of the foot).  Psychiatric: He has a normal mood and affect. His behavior is normal. Judgment and thought content normal.    Ortho Exam  Specialty Comments:  No specialty comments available.  Imaging: No results found. 05/12/2016 MRI OF THE RIGHT FOREFOOT WITHOUT AND WITH CONTRAST  TECHNIQUE: Multiplanar, multisequence MR imaging of the right forefoot was performed before and after the administration of intravenous contrast.  CONTRAST:  75mL MULTIHANCE GADOBENATE DIMEGLUMINE 529 MG/ML IV SOLN  COMPARISON:  04/15/2016  FINDINGS: Bones/Joint/Cartilage  Abnormal osseous edema and enhancement in the proximal and middle phalanges of the second toe with probable resorption of the head of the proximal phalanx, as well as fluid signal continuity between the interphalangeal joint and the dorsal skin on image 12/8,  potentially reflecting septic joint with drainage to the scan. There may be some bony fragments in the joint on image 12/8.  Abnormal edema in the head and distal shaft of the third metatarsal observed with some flattening along the inferior margin of the metatarsal head, suspicious for osteomyelitis.  Abnormal edema and enhancement in the proximal phalanx of the fourth digit and proximally in the middle phalanx of the fourth digit, suspicious for osteomyelitis.  Arthropathy at the first MTP joint with multiple small degenerative subcortical foci of edema and spurring. This is more likely to be due to degenerative and potentially superimposed inflammatory arthropathy. There is nonspecific edema in the medial sesamoid of the first digit compatible with sesamoiditis.  Degenerative subcortical cystic lesions are noted at the second tarsometatarsal articulation.  Ligaments  Lisfranc ligament intact  Muscles and Tendons  Low-level edema and enhancement in the interosseous muscles around the third metatarsal head. This may reflect secondary inflammation/ myositis. Mild tenosynovitis of the  second digit flexor tendons.  Soft tissues  Subcutaneous edema along the dorsum of the foot with little if any enhancement. No well-defined drainable abscess. Mild intermetatarsal bursitis in the first, second, and third intermetatarsal spaces.  IMPRESSION: 1. 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 Bony destruction of the head of the proximal phalanx possibly with bony fragments in the joint. 2. Suspected osteomyelitis involving the third metatarsal head and adjacent shaft. Suspected osteomyelitis involving the proximal and middle phalanges of the fourth toe. 3. Arthropathy at the first MTP joint likely combination of degenerative and erosive  arthropathy. Nonspecific edema in the adjacent medial sesamoid is compatible with sesamoiditis. 4. Myositis in the interosseous muscles around the third metatarsal distally. 5. Mild tenosynovitis of the second digit flexor tendons. 6. Mild intermetatarsal bursitis between the first, second, third, and fourth metatarsal heads.   PMFS History: Patient Active Problem List   Diagnosis Date Noted  . Anticoagulated 02/06/2015  . Acute on chronic combined systolic and diastolic heart failure ([place]) 01/30/2015  . Demand ischemia ([place]) 01/30/2015  . Atrial flutter with rapid ventricular response ([place]) 01/28/2015  . Chronic combined systolic and diastolic heart failure, NYHA class 2 ([place]) 04/17/2013  . Umbilical hernia   . Myocardial infarction   . Peripheral vascular disease ([place])   . Hypercholesteremia   . 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 Hypertension 09/01/2011  . Diabetes mellitus ([place]) 09/01/2011   Past Medical History:  Diagnosis Date  . Angina   . Atrial flutter, paroxysmal ([place])    arrived with aflutter with RVR, EF down to 25-30%, s/p TEE DCCV on 01/29/2015  . Chronic lower back pain    "all the time from my sciatic nerve"  . Erectile dysfunction   . Heart failure ([place])   . High cholesterol   . Hypercholesteremia   . Hypertension   . 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 Myocardial infarction 1988; ~1992;  ~1996;~ 2000  . Obesity   . Peripheral vascular disease ([place])   . Umbilical hernia    unrepaired    Family History  Problem Relation Age of Onset  . Heart disease Mother   . Heart attack Mother   . Heart disease Father   . Heart attack Father   . Heart attack Brother   . Stroke Neg Hx     Past Surgical History:  Procedure Laterality Date  . CARDIOVERSION N/A 01/29/2015   Procedure: CARDIOVERSION;  Surgeon: [name], MD;  Location: [facility];  Service: Cardiovascular;  Laterality: N/A;  . CORONARY ANGIOPLASTY WITH STENT PLACEMENT   08/31/11   "2 today; makes total of 3 stents"  . CORONARY ARTERY BYPASS GRAFT  1998; 2007   CABG X 4; CABG X4  . LEFT HEART CATHETERIZATION WITH CORONARY/GRAFT ANGIOGRAM N/A 08/31/2011   Procedure: LEFT HEART CATHETERIZATION WITH Beatrix Fetters;  Surgeon: [name], MD;  Location[facility] CATH LAB;  Service: Cardiovascular;  Laterality: N/A;  . PERIPHERAL VASCULAR CATHETERIZATION N/A 04/27/2016   Procedure: Abdominal Aortogram w/Lower Extremity;  Surgeon: Angelia Mould, MD;  Location: [place] [facility] CV LAB;  Service: Cardiovascular;  Laterality: N/A;  . TEE WITHOUT CARDIOVERSION N/A 01/29/2015   Procedure: TRANSESOPHAGEAL ECHOCARDIOGRAM (TEE);  Surgeon: [name], MD;  Location: [facility];  Service: Cardiovascular;  Laterality: N/A;   Social History   Occupational History  . Not on file.  Social History Main Topics  . Smoking status: Former Smoker    Packs/day: 2.00    Years: 6.00    Types: Cigarettes    Quit date: 06/16/1971  . Smokeless tobacco: Former Systems developer    Quit date: 06/15/1986     Comment: "chewed on cigars; never really did chew tobacco"  . Alcohol use No  . Drug use: No  . Sexual activity: No

## 2016-06-30 NOTE — Telephone Encounter (Signed)
Patient is overdue for an appointment and has been given multiple warnings to call and schedule but he still has failed to do so. His last two rx's were only for a quantity of fifteen. Please advise. Thanks, MI

## 2016-07-06 ENCOUNTER — Ambulatory Visit (INDEPENDENT_AMBULATORY_CARE_PROVIDER_SITE_OTHER): Payer: PPO | Admitting: Orthopaedic Surgery

## 2016-07-06 ENCOUNTER — Other Ambulatory Visit: Payer: Self-pay | Admitting: Interventional Cardiology

## 2016-07-09 DIAGNOSIS — H2510 Age-related nuclear cataract, unspecified eye: Secondary | ICD-10-CM | POA: Diagnosis not present

## 2016-07-09 DIAGNOSIS — H04123 Dry eye syndrome of bilateral lacrimal glands: Secondary | ICD-10-CM | POA: Diagnosis not present

## 2016-07-09 DIAGNOSIS — E089 Diabetes mellitus due to underlying condition without complications: Secondary | ICD-10-CM | POA: Diagnosis not present

## 2016-07-09 DIAGNOSIS — E113393 Type 2 diabetes mellitus with moderate nonproliferative diabetic retinopathy without macular edema, bilateral: Secondary | ICD-10-CM | POA: Diagnosis not present

## 2016-07-15 ENCOUNTER — Ambulatory Visit (INDEPENDENT_AMBULATORY_CARE_PROVIDER_SITE_OTHER): Payer: Medicare HMO | Admitting: Orthopedic Surgery

## 2016-07-15 ENCOUNTER — Encounter (INDEPENDENT_AMBULATORY_CARE_PROVIDER_SITE_OTHER): Payer: Self-pay | Admitting: Orthopedic Surgery

## 2016-07-15 VITALS — BP 169/85 | HR 81 | Resp 15 | Ht 72.0 in | Wt 223.0 lb

## 2016-07-15 DIAGNOSIS — L089 Local infection of the skin and subcutaneous tissue, unspecified: Secondary | ICD-10-CM | POA: Diagnosis not present

## 2016-07-15 NOTE — Progress Notes (Signed)
Office Visit Note   Patient: Justin Blackburn           Date of Birth: 25-Dec-1950           MRN: [id number] Visit Date: 07/15/2016              Requested by: Leonides Sake, MD [place], [place] [address] PCP: Leonides Sake, MD   Assessment & Plan: Visit Diagnoses:  1. Infection of right foot     Plan:  #1: Continue doxycycline #2: Call in the interim if he has any problems otherwise follow up in 3 weeks  Follow-Up Instructions: Return in about 3 weeks (around 08/05/2016).   Orders:  No orders of the defined types were placed in this encounter.  No orders of the defined types were placed in this encounter.     Procedures: No procedures performed   Clinical Data: No additional findings.   Subjective: Chief Complaint  Patient presents with  . Right Foot - Follow-up    He is a very pleasant 66 year old white male who is seen today for follow-up of his right foot second toe ulceration and with a cellulitis and infection as well as the fourth toe. He was seen 2 weeks ago was continued to have improvement in regards to his right foot. He states he does not have any pain at all. Continues to use mupirocin to the second toe PIP joint dorsally. He does occasionally soak the toe and foot. He continues activities as tolerated and really is not having much in way of any pain. Seen today for evaluation.     Review of Systems  Constitutional: Negative.   HENT: Negative.   Eyes: Negative.   Cardiovascular:       History of atrial flutter on eliqus. Also on Plavix.  Gastrointestinal: Negative.   Endocrine:       Diabetic on metformin and Januvia  Genitourinary: Negative.   Allergic/Immunologic: Negative.   Neurological: Negative.   Psychiatric/Behavioral: Negative.      Objective: Vital Signs: BP (!) 169/85 (BP Location: Left Arm, Patient Position: Sitting, Cuff Size: Normal)   Pulse 81   Resp 15   Ht 6' (1.829 m)   Wt 223 lb (101.2 kg)   BMI  30.24 kg/m   Physical Exam  Constitutional: He is oriented to person, place, and time. He appears well-developed and well-nourished.  HENT:  Head: Normocephalic and atraumatic.  Eyes: EOM are normal. Pupils are equal, round, and reactive to light.  Pulmonary/Chest: Effort normal.  Neurological: He is alert and oriented to person, place, and time.  Skin: There is erythema (there is less erythema now in the second and fourth toe. No pain to palpation. Pain with manipulation of the joints of the second and fourth toe.).  Psychiatric: He has a normal mood and affect. His behavior is normal. Judgment and thought content normal.    Ortho Exam there is less erythema now in the second and fourth toe. No pain to palpation. Pain with manipulation of the joints of the second and fourth toe.    Specialty Comments:  No specialty comments available.  Imaging: No results found.   PMFS History: Patient Active Problem List   Diagnosis Date Noted  . Anticoagulated 02/06/2015  . Acute on chronic combined systolic and diastolic heart failure ([place]) 01/30/2015  . Demand ischemia ([place]) 01/30/2015  . Atrial flutter with rapid ventricular response ([place]) 01/28/2015  . Chronic combined systolic and diastolic heart failure, NYHA  class 2 ([place]) 04/17/2013  . Umbilical hernia   . Myocardial infarction   . Peripheral vascular disease ([place])   . Hypercholesteremia   . 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 Hypertension 09/01/2011  . Diabetes mellitus ([place]) 09/01/2011   Past Medical History:  Diagnosis Date  . Angina   . Atrial flutter, paroxysmal ([place])    arrived with aflutter with RVR, EF down to 25-30%, s/p TEE DCCV on 01/29/2015  . Chronic lower back pain    "all the time from my sciatic nerve"  . Erectile dysfunction   . Heart failure ([place])   . High cholesterol   . Hypercholesteremia   . Hypertension   . 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 Myocardial infarction 1988; ~1992;  ~1996;~  2000  . Obesity   . Peripheral vascular disease ([place])   . Umbilical hernia    unrepaired    Family History  Problem Relation Age of Onset  . Heart disease Mother   . Heart attack Mother   . Heart disease Father   . Heart attack Father   . Heart attack Brother   . Stroke Neg Hx     Past Surgical History:  Procedure Laterality Date  . CARDIOVERSION N/A 01/29/2015   Procedure: CARDIOVERSION;  Surgeon: [name], MD;  Location: [facility];  Service: Cardiovascular;  Laterality: N/A;  . CORONARY ANGIOPLASTY WITH STENT PLACEMENT  08/31/11   "2 today; makes total of 3 stents"  . CORONARY ARTERY BYPASS GRAFT  1998; 2007   CABG X 4; CABG X4  . LEFT HEART CATHETERIZATION WITH CORONARY/GRAFT ANGIOGRAM N/A 08/31/2011   Procedure: LEFT HEART CATHETERIZATION WITH Beatrix Fetters;  Surgeon: [name], MD;  Location[facility] CATH LAB;  Service: Cardiovascular;  Laterality: N/A;  . PERIPHERAL VASCULAR CATHETERIZATION N/A 04/27/2016   Procedure: Abdominal Aortogram w/Lower Extremity;  Surgeon: Angelia Mould, MD;  Location: [facility] CV LAB;  Service: Cardiovascular;  Laterality: N/A;  . TEE WITHOUT CARDIOVERSION N/A 01/29/2015   Procedure: TRANSESOPHAGEAL ECHOCARDIOGRAM (TEE);  Surgeon: [name], MD;  Location: [facility];  Service: Cardiovascular;  Laterality: N/A;   Social History   Occupational History  . Not on file.   Social History Main Topics  . Smoking status: Former Smoker    Packs/day: 2.00    Years: 6.00    Types: Cigarettes    Quit date: 06/16/1971  . Smokeless tobacco: Former Systems developer    Quit date: 06/15/1986     Comment: "chewed on cigars; never really did chew tobacco"  . Alcohol use No  . Drug use: No  . Sexual activity: No

## 2016-07-17 ENCOUNTER — Other Ambulatory Visit: Payer: Self-pay | Admitting: Interventional Cardiology

## 2016-07-20 ENCOUNTER — Other Ambulatory Visit: Payer: Self-pay | Admitting: *Deleted

## 2016-07-20 MED ORDER — APIXABAN 5 MG PO TABS: ORAL_TABLET | ORAL | 1 refills | Status: DC

## 2016-07-20 NOTE — Telephone Encounter (Signed)
Per msg left on the refill vm by the patient, he is out of eliquis. He stated that the pharmacy told him that they could not refill it and he would like to know why. Patient can be reached at [phone]. Thanks, MI

## 2016-07-29 NOTE — Progress Notes (Deleted)
follow-up of his right foot second toe ulceration and with a cellulitis and infection as well as the fourth toe. He was seen  3 weeks ago was continued to have improvement in regards to his right foot. He states he does not have any pain at all. Continues to use mupirocin to the second toe PIP joint dorsally. He does occasionally soak the toe and foot. He continues activities as tolerated and really is not having much in way of  any pain. Seen today for evaluation.

## 2016-07-30 ENCOUNTER — Encounter (INDEPENDENT_AMBULATORY_CARE_PROVIDER_SITE_OTHER): Payer: Self-pay | Admitting: Orthopaedic Surgery

## 2016-07-30 ENCOUNTER — Ambulatory Visit (INDEPENDENT_AMBULATORY_CARE_PROVIDER_SITE_OTHER): Payer: Medicare HMO | Admitting: Orthopaedic Surgery

## 2016-07-30 VITALS — BP 146/94 | HR 94 | Ht 72.0 in | Wt 223.0 lb

## 2016-07-30 DIAGNOSIS — E11628 Type 2 diabetes mellitus with other skin complications: Secondary | ICD-10-CM

## 2016-07-30 DIAGNOSIS — E1169 Type 2 diabetes mellitus with other specified complication: Secondary | ICD-10-CM

## 2016-07-30 DIAGNOSIS — L089 Local infection of the skin and subcutaneous tissue, unspecified: Secondary | ICD-10-CM | POA: Diagnosis not present

## 2016-07-30 NOTE — Progress Notes (Signed)
Office Visit Note   Patient: Justin Blackburn           Date of Birth: 07-Nov-1950           MRN: [id number] Visit Date: 07/30/2016              Requested by: Leonides Sake, MD [place], [place] [address] PCP: Leonides Sake, MD   Assessment & Plan: Visit Diagnoses:  1. Diabetic infection of right foot [facility])    Mr. Edmonston has been followed over a period of many weeks for the diabetic issues with his right foot. He initially had an infection across the PIP joint of the second toe which resolved with doxycycline. He then developed   a second ulcer along the lateral aspect of his fourth toe. Been treating this with new mupropicin and continued doxycycline. He notes he is doing well without fever or chills or Plan: D/C mupropicin and use a dry dressing. Discontinue the doxycycline and follow-up in 2 weeks.His right foot is stable at this point. He is fully aware that at some point he may require surgical intervention   Follow-Up Instructions: Return in about 2 weeks (around 08/13/2016), or if symptoms worsen or fail to improve.   Orders:  No orders of the defined types were placed in this encounter.  No orders of the defined types were placed in this encounter.     Procedures: No procedures performed   Clinical Data: No additional findings.   Subjective: Chief Complaint  Patient presents with  . Right Foot - Follow-up  . Right 2nd Toe - Pain  Mr. Gossard has been followed for well over a month initially for evaluation of the ulcer of the right second toe and more recently for an ulceration of the right fourth toe. He's been using a topical cream and.doxycycline. He relates he is not having any particular problem in terms of fever or chills or any further swelling of his right foot. He is diabetic and has diabetic neuropathy. He has had a vascular evaluation and that he had adequate circulation to support a distal amputation if needed.  HPI  Review of  Systems   Objective: Vital Signs: BP (!) 146/94   Pulse 94   Ht 6' (1.829 m)   Wt 223 lb (101.2 kg)   BMI 30.24 kg/m   Physical Exam  Ortho Exam examination of the right foot demonstrates swelling of the second toe. He also over the PIP joint has completely epithelialized. Good capillary refill. He does have altered sensibility to to his foot consistent with his neuropathy. There is an ulcer along the lateral aspect of the fourth toe in the area of the PIP joint i.e. between the toes. It looks like it's fairly superficial at this point. This fourth toe is swollen but no pain and not red. The dorsum of his foot is not swollen.  No evidence of an ascending lymphangitis or abscess formation.  Specialty Comments:  No specialty comments available.  Imaging: No results found.   PMFS History: Patient Active Problem List   Diagnosis Date Noted  . Anticoagulated 02/06/2015  . Acute on chronic combined systolic and diastolic heart failure ([place]) 01/30/2015  . Demand ischemia ([place]) 01/30/2015  . Atrial flutter with rapid ventricular response ([place]) 01/28/2015  . Chronic combined systolic and diastolic heart failure, NYHA class 2 ([place]) 04/17/2013  . Umbilical hernia   . Myocardial infarction   . Peripheral vascular disease ([place])   .  Hypercholesteremia   . 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 Hypertension 09/01/2011  . Diabetes mellitus ([place]) 09/01/2011   Past Medical History:  Diagnosis Date  . Angina   . Atrial flutter, paroxysmal ([place])    arrived with aflutter with RVR, EF down to 25-30%, s/p TEE DCCV on 01/29/2015  . Chronic lower back pain    "all the time from my sciatic nerve"  . Erectile dysfunction   . Heart failure ([place])   . High cholesterol   . Hypercholesteremia   . Hypertension   . 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 Myocardial infarction 1988; ~1992;  ~1996;~ 2000  . Obesity   . Peripheral vascular disease ([place])   . Umbilical hernia    unrepaired     Family History  Problem Relation Age of Onset  . Heart disease Mother   . Heart attack Mother   . Heart disease Father   . Heart attack Father   . Heart attack Brother   . Stroke Neg Hx     Past Surgical History:  Procedure Laterality Date  . CARDIOVERSION N/A 01/29/2015   Procedure: CARDIOVERSION;  Surgeon: [name], MD;  Location: [facility];  Service: Cardiovascular;  Laterality: N/A;  . CORONARY ANGIOPLASTY WITH STENT PLACEMENT  08/31/11   "2 today; makes total of 3 stents"  . CORONARY ARTERY BYPASS GRAFT  1998; 2007   CABG X 4; CABG X4  . LEFT HEART CATHETERIZATION WITH CORONARY/GRAFT ANGIOGRAM N/A 08/31/2011   Procedure: LEFT HEART CATHETERIZATION WITH Beatrix Fetters;  Surgeon: [name], MD;  Location: [facility] CATH LAB;  Service: Cardiovascular;  Laterality: N/A;  . PERIPHERAL VASCULAR CATHETERIZATION N/A 04/27/2016   Procedure: Abdominal Aortogram w/Lower Extremity;  Surgeon: Angelia Mould, MD;  Location: [place] CV LAB;  Service: Cardiovascular;  Laterality: N/A;  . TEE WITHOUT CARDIOVERSION N/A 01/29/2015   Procedure: TRANSESOPHAGEAL ECHOCARDIOGRAM (TEE);  Surgeon: [name], MD;  Location: [facility];  Service: Cardiovascular;  Laterality: N/A;   Social History   Occupational History  . Not on file.   Social History Main Topics  . Smoking status: Former Smoker    Packs/day: 2.00    Years: 6.00    Types: Cigarettes    Quit date: 06/16/1971  . Smokeless tobacco: Former Systems developer    Quit date: 06/15/1986     Comment: "chewed on cigars; never really did chew tobacco"  . Alcohol use No  . Drug use: No  . Sexual activity: No

## 2016-08-03 ENCOUNTER — Ambulatory Visit (INDEPENDENT_AMBULATORY_CARE_PROVIDER_SITE_OTHER): Payer: Medicare HMO | Admitting: Orthopaedic Surgery

## 2016-08-03 ENCOUNTER — Telehealth (INDEPENDENT_AMBULATORY_CARE_PROVIDER_SITE_OTHER): Payer: Self-pay | Admitting: Orthopaedic Surgery

## 2016-08-03 DIAGNOSIS — I2510 Atherosclerotic heart disease of native coronary artery without angina pectoris: Secondary | ICD-10-CM | POA: Diagnosis not present

## 2016-08-03 DIAGNOSIS — E1140 Type 2 diabetes mellitus with diabetic neuropathy, unspecified: Secondary | ICD-10-CM | POA: Diagnosis not present

## 2016-08-03 DIAGNOSIS — I4892 Unspecified atrial flutter: Secondary | ICD-10-CM | POA: Diagnosis not present

## 2016-08-03 DIAGNOSIS — Z1389 Encounter for screening for other disorder: Secondary | ICD-10-CM | POA: Diagnosis not present

## 2016-08-03 DIAGNOSIS — D239 Other benign neoplasm of skin, unspecified: Secondary | ICD-10-CM | POA: Diagnosis not present

## 2016-08-03 DIAGNOSIS — I739 Peripheral vascular disease, unspecified: Secondary | ICD-10-CM | POA: Diagnosis not present

## 2016-08-03 DIAGNOSIS — Z79899 Other long term (current) drug therapy: Secondary | ICD-10-CM | POA: Diagnosis not present

## 2016-08-03 DIAGNOSIS — I10 Essential (primary) hypertension: Secondary | ICD-10-CM | POA: Diagnosis not present

## 2016-08-03 DIAGNOSIS — Z6831 Body mass index (BMI) 31.0-31.9, adult: Secondary | ICD-10-CM | POA: Diagnosis not present

## 2016-08-03 DIAGNOSIS — E782 Mixed hyperlipidemia: Secondary | ICD-10-CM | POA: Diagnosis not present

## 2016-08-03 NOTE — Telephone Encounter (Signed)
Jarrett Soho from [facility] and [place] called needing the office notes for [name] faxed over to Dr. Cornelia Copa Conroy's office. . .ASU#[phone]. [name] FE#[phone] (ask for [facility]).  Thank you.

## 2016-08-04 NOTE — Telephone Encounter (Signed)
Faxed notes to note below

## 2016-08-10 DIAGNOSIS — D239 Other benign neoplasm of skin, unspecified: Secondary | ICD-10-CM | POA: Diagnosis not present

## 2016-08-10 DIAGNOSIS — Z6832 Body mass index (BMI) 32.0-32.9, adult: Secondary | ICD-10-CM | POA: Diagnosis not present

## 2016-08-10 DIAGNOSIS — D2371 Other benign neoplasm of skin of right lower limb, including hip: Secondary | ICD-10-CM | POA: Diagnosis not present

## 2016-08-13 ENCOUNTER — Ambulatory Visit (INDEPENDENT_AMBULATORY_CARE_PROVIDER_SITE_OTHER): Payer: Medicare HMO | Admitting: Orthopaedic Surgery

## 2016-08-13 ENCOUNTER — Encounter (INDEPENDENT_AMBULATORY_CARE_PROVIDER_SITE_OTHER): Payer: Self-pay | Admitting: Orthopaedic Surgery

## 2016-08-13 VITALS — BP 156/80 | HR 74 | Ht 72.0 in | Wt 230.0 lb

## 2016-08-13 DIAGNOSIS — M86471 Chronic osteomyelitis with draining sinus, right ankle and foot: Secondary | ICD-10-CM | POA: Diagnosis not present

## 2016-08-13 NOTE — Progress Notes (Deleted)
Office Visit Note   Patient: Justin Blackburn           Date of Birth: 10-21-1950           MRN: [id number] Visit Date: 08/13/2016              Requested by: Leonides Sake, MD [place], [place] [address] PCP: Leonides Sake, MD   Assessment & Plan: Visit Diagnoses: No diagnosis found.  Plan: ***  Follow-Up Instructions: No Follow-up on file.   Orders:  No orders of the defined types were placed in this encounter.  No orders of the defined types were placed in this encounter.     Procedures: No procedures performed   Clinical Data: No additional findings.   Subjective: Chief Complaint  Patient presents with  . Right Foot - Follow-up    Pt had previous diabetic issues with his right foot. He initially had an infection across the PIP joint of the second toe which resolved with doxycycline. He then developed   a second ulcer along the lateral aspect of his fourth toe.    Review of Systems   Objective: Vital Signs: Ht 6' (1.829 m)   Wt 225 lb (102.1 kg)   BMI 30.52 kg/m   Physical Exam  Ortho Exam  Specialty Comments:  No specialty comments available.  Imaging: No results found.   PMFS History: Patient Active Problem List   Diagnosis Date Noted  . Anticoagulated 02/06/2015  . Acute on chronic combined systolic and diastolic heart failure ([place]) 01/30/2015  . Demand ischemia ([place]) 01/30/2015  . Atrial flutter with rapid ventricular response ([place]) 01/28/2015  . Chronic combined systolic and diastolic heart failure, NYHA class 2 ([place]) 04/17/2013  . Umbilical hernia   . Myocardial infarction   . Peripheral vascular disease ([place])   . Hypercholesteremia   . 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 Hypertension 09/01/2011  . Diabetes mellitus ([place]) 09/01/2011   Past Medical History:  Diagnosis Date  . Angina   . Atrial flutter, paroxysmal ([place])    arrived with aflutter with RVR, EF down to 25-30%, s/p TEE DCCV on 01/29/2015  . Chronic lower  back pain    "all the time from my sciatic nerve"  . Erectile dysfunction   . Heart failure ([place])   . High cholesterol   . Hypercholesteremia   . Hypertension   . 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 Myocardial infarction 1988; ~1992;  ~1996;~ 2000  . Obesity   . Peripheral vascular disease ([place])   . Umbilical hernia    unrepaired    Family History  Problem Relation Age of Onset  . Heart disease Mother   . Heart attack Mother   . Heart disease Father   . Heart attack Father   . Heart attack Brother   . Stroke Neg Hx     Past Surgical History:  Procedure Laterality Date  . CARDIOVERSION N/A 01/29/2015   Procedure: CARDIOVERSION;  Surgeon: [name], MD;  Location: [facility];  Service: Cardiovascular;  Laterality: N/A;  . CORONARY ANGIOPLASTY WITH STENT PLACEMENT  08/31/11   "2 today; makes total of 3 stents"  . CORONARY ARTERY BYPASS GRAFT  1998; 2007   CABG X 4; CABG X4  . LEFT HEART CATHETERIZATION WITH CORONARY/GRAFT ANGIOGRAM N/A 08/31/2011   Procedure: LEFT HEART CATHETERIZATION WITH Beatrix Fetters;  Surgeon: [name], MD;  Location: [facility] LAB;  Service: Cardiovascular;  Laterality:  N/A;  . PERIPHERAL VASCULAR CATHETERIZATION N/A 04/27/2016   Procedure: Abdominal Aortogram w/Lower Extremity;  Surgeon: Angelia Mould, MD;  Location: [facility] CV LAB;  Service: Cardiovascular;  Laterality: N/A;  . TEE WITHOUT CARDIOVERSION N/A 01/29/2015   Procedure: TRANSESOPHAGEAL ECHOCARDIOGRAM (TEE);  Surgeon: [name], MD;  Location: [facility];  Service: Cardiovascular;  Laterality: N/A;   Social History   Occupational History  . Not on file.   Social History Main Topics  . Smoking status: Former Smoker    Packs/day: 2.00    Years: 6.00    Types: Cigarettes    Quit date: 06/16/1971  . Smokeless tobacco: Former Systems developer    Quit date: 06/15/1986     Comment: "chewed on cigars; never really did chew tobacco"  .  Alcohol use No  . Drug use: No  . Sexual activity: No

## 2016-08-13 NOTE — Progress Notes (Signed)
Office Visit Note   Patient: Justin Blackburn           Date of Birth: 1950/12/03           MRN: [id number] Visit Date: 08/13/2016              Requested by: Justin Sake, MD [address] [place], [place] [address] PCP: No PCP Per Patient   Assessment & Plan: Visit Diagnoses:  1. 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  Follow-Up Instructions: Return in about 4 weeks (around 09/10/2016).   Orders:  No orders of the defined types were placed in this encounter.  No orders of the defined types were placed in this encounter.     Procedures: No procedures performed   Clinical Data: No additional findings.   Subjective: Chief Complaint  Patient presents with  . Right Foot - Follow-up    [name] has been followed over a period of many weeks for the diabetic issues with his right foot. He initially had an infection across the PIP joint of the second toe which resolved with doxycycline. He then developed   a second ulcer along the lateral aspect of his fourth toe. Been treating this with new mupropicin and continued doxycycline. He notes he is doing well without fever or chills. At his last visit the mupropicin was discontinued and use a  dry dressing. Discontinued the doxycycline and and returned today for reevaluation.      Review of Systems  Constitutional: Negative.   HENT: Negative.   Eyes: Negative.   Cardiovascular:       History of atrial flutter on eliqus. Also on Plavix.  Gastrointestinal: Negative.   Endocrine:       Diabetic on metformin and Januvia  Genitourinary: Negative.   Allergic/Immunologic: Negative.   Neurological: Negative.   Psychiatric/Behavioral: Negative.      Objective: Vital Signs: BP (!) 156/80 (BP Location: Left Arm, Patient  Position: Sitting, Cuff Size: Normal)   Pulse 74   Ht 6' (1.829 m)   Wt 230 lb (104.3 kg)   BMI 31.19 kg/m   Physical Exam  Constitutional: He is oriented to person, place, and time. He appears well-developed and well-nourished.  HENT:  Head: Normocephalic and atraumatic.  Eyes: EOM are normal. Pupils are equal, round, and reactive to light.  Pulmonary/Chest: Effort normal.  Neurological: He is alert and oriented to person, place, and time.  Skin: No erythema (there is less erythema now in the second and fourth toe. No pain to palpation. Pain with manipulation of the joints of the second and fourth toe.).  Psychiatric: He has a normal mood and affect. His behavior is normal. Judgment and thought content normal.    Ortho Exam  The second toe without opening or drainage. No real change in the color of the toe since last visit. The fourth toe reveals ulceration of the lateral aspect of the PIP joint. This is not erythematous. There is a little bit of a yellow drainage on his gauze pad. This is not tender at all in either toe. No pain in his foot at this time but then again he has a neuropathy.   Specialty Comments:  No specialty comments available.  Imaging: No results found.  PMFS History: Patient Active Problem List   Diagnosis Date Noted  . 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 Anticoagulated 02/06/2015  . Acute on chronic combined systolic and diastolic heart failure ([place]) 01/30/2015  . Demand ischemia ([place]) 01/30/2015  . Atrial flutter with rapid ventricular response ([place]) 01/28/2015  . Chronic combined systolic and diastolic heart failure, NYHA class 2 ([place]) 04/17/2013  . Umbilical hernia   . Myocardial infarction   . Peripheral vascular disease ([place])   . Hypercholesteremia   . 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 Hypertension 09/01/2011  . Diabetes mellitus ([place]) 09/01/2011   Past Medical History:  Diagnosis Date  . Angina   . Atrial  flutter, paroxysmal ([place])    arrived with aflutter with RVR, EF down to 25-30%, s/p TEE DCCV on 01/29/2015  . Chronic lower back pain    "all the time from my sciatic nerve"  . Erectile dysfunction   . Heart failure ([place])   . High cholesterol   . Hypercholesteremia   . Hypertension   . 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 Myocardial infarction 1988; ~1992;  ~1996;~ 2000  . Obesity   . Peripheral vascular disease ([place])   . Umbilical hernia    unrepaired    Family History  Problem Relation Age of Onset  . Heart disease Mother   . Heart attack Mother   . Heart disease Father   . Heart attack Father   . Heart attack Brother   . Stroke Neg Hx     Past Surgical History:  Procedure Laterality Date  . CARDIOVERSION N/A 01/29/2015   Procedure: CARDIOVERSION;  Surgeon: [name], MD;  Location: [facility];  Service: Cardiovascular;  Laterality: N/A;  . CORONARY ANGIOPLASTY WITH STENT PLACEMENT  08/31/11   "2 today; makes total of 3 stents"  . CORONARY ARTERY BYPASS GRAFT  1998; 2007   CABG X 4; CABG X4  . LEFT HEART CATHETERIZATION WITH CORONARY/GRAFT ANGIOGRAM N/A 08/31/2011   Procedure: LEFT HEART CATHETERIZATION WITH Beatrix Fetters;  Surgeon: [name], MD;  Location: [facility] CATH LAB;  Service: Cardiovascular;  Laterality: N/A;  . PERIPHERAL VASCULAR CATHETERIZATION N/A 04/27/2016   Procedure: Abdominal Aortogram w/Lower Extremity;  Surgeon: Angelia Mould, MD;  Location: [facility] CV LAB;  Service: Cardiovascular;  Laterality: N/A;  . TEE WITHOUT CARDIOVERSION N/A 01/29/2015   Procedure: TRANSESOPHAGEAL ECHOCARDIOGRAM (TEE);  Surgeon: [name], MD;  Location: [facility];  Service: Cardiovascular;  Laterality: N/A;   Social History   Occupational History  . Not on file.   Social History Main Topics  . Smoking status: Former Smoker    Packs/day: 2.00    Years: 6.00    Types: Cigarettes    Quit date: 06/16/1971  .  Smokeless tobacco: Former Systems developer    Quit date: 06/15/1986     Comment: "chewed on cigars; never really did chew tobacco"  . Alcohol use No  . Drug use: No  . Sexual activity: No

## 2016-08-17 ENCOUNTER — Ambulatory Visit (INDEPENDENT_AMBULATORY_CARE_PROVIDER_SITE_OTHER): Payer: Medicare HMO | Admitting: Orthopaedic Surgery

## 2016-08-28 ENCOUNTER — Encounter (INDEPENDENT_AMBULATORY_CARE_PROVIDER_SITE_OTHER): Payer: Self-pay

## 2016-08-28 ENCOUNTER — Encounter: Payer: Self-pay | Admitting: Interventional Cardiology

## 2016-08-28 ENCOUNTER — Ambulatory Visit (INDEPENDENT_AMBULATORY_CARE_PROVIDER_SITE_OTHER): Payer: Medicare HMO | Admitting: Interventional Cardiology

## 2016-08-28 VITALS — BP 136/86 | HR 67 | Ht 72.0 in | Wt 232.0 lb

## 2016-08-28 DIAGNOSIS — I739 Peripheral vascular disease, unspecified: Secondary | ICD-10-CM | POA: Diagnosis not present

## 2016-08-28 DIAGNOSIS — I25709 Atherosclerosis of coronary artery bypass graft(s), unspecified, with unspecified angina pectoris: Secondary | ICD-10-CM | POA: Diagnosis not present

## 2016-08-28 DIAGNOSIS — E0800 Diabetes mellitus due to underlying condition with hyperosmolarity without nonketotic hyperglycemic-hyperosmolar coma (NKHHC): Secondary | ICD-10-CM | POA: Diagnosis not present

## 2016-08-28 DIAGNOSIS — I5042 Chronic combined systolic (congestive) and diastolic (congestive) heart failure: Secondary | ICD-10-CM

## 2016-08-28 DIAGNOSIS — I25810 Atherosclerosis of coronary artery bypass graft(s) without angina pectoris: Secondary | ICD-10-CM | POA: Insufficient documentation

## 2016-08-28 NOTE — Progress Notes (Signed)
Cardiology Office Note    Date:  08/28/2016   ID:  Justin Blackburn, DOB 1951-04-17, MRN [id number]  PCP:  [facility]  Cardiologist: [name], MD   Chief Complaint  Patient presents with  . Coronary Artery Disease  . Atrial Fibrillation    History of Present Illness:  Justin Blackburn is a 66 y.o. male who presents for CAD with prior coronary bypass grafting, diabetes mellitus, combined chronic systolic and diastolic heart failure, paroxysmal atrial fibrillation, essential hypertension, and diabetes. Patient is on anticoagulation therapy. mellitus.  His had a poor healing right foot/great toe skin lesion. It is now improving. He gets aching in his left shoulder and subclavicular area when under stress. He feels this may be ischemia related. This complaint is stable and has been present intermittently for several years.He is not requiring sublingual nitroglycerin. Lower extremity swelling and dyspnea have markedly improved. Medication compliance has improved.  Past Medical History:  Diagnosis Date  . Angina   . Atrial flutter, paroxysmal ([place])    arrived with aflutter with RVR, EF down to 25-30%, s/p TEE DCCV on 01/29/2015  . Chronic lower back pain    "all the time from my sciatic nerve"  . Erectile dysfunction   . Heart failure ([place])   . High cholesterol   . Hypercholesteremia   . Hypertension   . 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 Myocardial infarction 1988; ~1992;  ~1996;~ 2000  . Obesity   . Peripheral vascular disease ([place])   . Umbilical hernia    unrepaired    Past Surgical History:  Procedure Laterality Date  . CARDIOVERSION N/A 01/29/2015   Procedure: CARDIOVERSION;  Surgeon: [name], MD;  Location: [facility];  Service: Cardiovascular;  Laterality: N/A;  . CORONARY ANGIOPLASTY WITH STENT PLACEMENT  08/31/11   "2 today; makes total of 3 stents"  . CORONARY ARTERY BYPASS GRAFT  1998; 2007   CABG X 4; CABG X4    . LEFT HEART CATHETERIZATION WITH CORONARY/GRAFT ANGIOGRAM N/A 08/31/2011   Procedure: LEFT HEART CATHETERIZATION WITH Beatrix Fetters;  Surgeon: [name], MD;  Location: [facility] CATH LAB;  Service: Cardiovascular;  Laterality: N/A;  . PERIPHERAL VASCULAR CATHETERIZATION N/A 04/27/2016   Procedure: Abdominal Aortogram w/Lower Extremity;  Surgeon: Angelia Mould, MD;  Location: [facility] CV LAB;  Service: Cardiovascular;  Laterality: N/A;  . TEE WITHOUT CARDIOVERSION N/A 01/29/2015   Procedure: TRANSESOPHAGEAL ECHOCARDIOGRAM (TEE);  Surgeon: [name], MD;  Location: [facility] ENDOSCOPY;  Service: Cardiovascular;  Laterality: N/A;    Current Medications: Outpatient Medications Prior to Visit  Medication Sig Dispense Refill  . apixaban (ELIQUIS) 5 MG TABS tablet TAKE 1 TABLET BY MOUTH TWICE A DAY, APPT NEEDED FOR REFILLS 60 tablet 1  . aspirin EC 81 MG tablet Take 1 tablet (81 mg total) by mouth daily.    . carvedilol (COREG) 6.25 MG tablet Take 6.25 mg by mouth 2 (two) times daily with a meal.  1  . clindamycin (CLEOCIN) 300 MG capsule TAKE 2 CAPSULES BY MOUTH THREE TIMES DAILY FOR 10 DAYS FOR BONE INFECTION  0  . clopidogrel (PLAVIX) 75 MG tablet Take 1 tablet by mouth daily.    . furosemide (LASIX) 40 MG tablet TAKE 1 TABLET (40 MG TOTAL) BY MOUTH DAILY. PLEASE SCHEDULE APPOINTMENT FOR REFILLS. 30 tablet 4  . LANTUS SOLOSTAR 100 UNIT/ML Solostar Pen Inject 75 Units into the skin daily.  3  . losartan-hydrochlorothiazide (  HYZAAR) 100-12.5 MG tablet TAKE 1 (ONE) TABLET BY MOUTH DAILY FOR HIGH BP  1  . metFORMIN (GLUCOPHAGE) 1000 MG tablet Take 1,000 mg by mouth 2 (two) times daily.  0  . nitroGLYCERIN (NITROSTAT) 0.4 MG SL tablet Place 1 tablet (0.4 mg total) under the tongue every 5 (five) minutes as needed for chest pain ([place]). 25 tablet 3  . Olmesartan-Amlodipine-HCTZ 40-5-12.5 MG TABS TAKE 1 TABLET BY MOUTH EVERY DAY 15 tablet 0  . pravastatin (PRAVACHOL) 40 MG  tablet Take 40 mg by mouth at bedtime.    . sitaGLIPtin (JANUVIA) 100 MG tablet Take 100 mg by mouth daily.    [name] doxycycline (VIBRAMYCIN) 100 MG capsule Take 1 capsule (100 mg total) by mouth 2 (two) times daily. (Patient not taking: Reported on 08/28/2016) 60 capsule 0  . Olmesartan-Amlodipine-HCTZ 40-5-12.5 MG TABS Take 1 tablet by mouth daily. APPOINTMENT IS NEEDED (Patient not taking: Reported on 08/28/2016) 15 tablet 0   No facility-administered medications prior to visit.      Allergies:   Patient has no known allergies.   Social History   Social History  . Marital status: Married    Spouse name: N/A  . Number of children: N/A  . Years of education: N/A   Social History Main Topics  . Smoking status: Former Smoker    Packs/day: 2.00    Years: 6.00    Types: Cigarettes    Quit date: 06/16/1971  . Smokeless tobacco: Former Systems developer    Quit date: 06/15/1986     Comment: "chewed on cigars; never really did chew tobacco"  . Alcohol use No  . Drug use: No  . Sexual activity: No   Other Topics Concern  . None   Social History Narrative  . None     Family History:  The patient's family history includes Heart attack in his brother, father, and mother; Heart disease in his father and mother.   ROS:   Please see the history of present illness.    Back pain, vision disturbance, decreased hearing, snoring, and ulcer on the great toe of the right foot.  All other systems reviewed and are negative.   PHYSICAL EXAM:   VS:  BP 136/86 (BP Location: Left Arm)   Pulse 67   Ht 6' (1.829 m)   Wt 232 lb (105.2 kg)   BMI 31.46 kg/m    GEN: Well nourished, well developed, in no acute distress . Bearded. Difficult to see neck veins. HEENT: normal  Neck: no JVD, carotid bruits, or masses Cardiac: RRR; no murmurs, rubs, or gallops,no edema  Respiratory:  clear to auscultation bilaterally, normal work of breathing GI: soft, nontender, nondistended, + BS MS: no deformity or atrophy  Skin:  warm and dry, no rash Neuro:  Alert and Oriented x 3, Strength and sensation are intact Psych: euthymic mood, full affect  Wt Readings from Last 3 Encounters:  08/28/16 232 lb (105.2 kg)  08/13/16 230 lb (104.3 kg)  07/29/16 223 lb (101.2 kg)      Studies/Labs Reviewed:   EKG:  EKG  Not repeated.  Recent Labs: 04/27/2016: BUN 13; Creatinine, Ser 0.90; Hemoglobin 14.3; Potassium 3.7; Sodium 140   Lipid Panel No results found for: CHOL, TRIG, HDL, CHOLHDL, VLDL, LDLCALC, LDLDIRECT  Additional studies/ records that were reviewed today include:  No new functional or imaging studies  Lower extremity angiography and aortography November 2017: FINDINGS:  1. There are single renal arteries bilaterally with no significant renal artery  stenosis identified. The infrarenal aorta, bilateral common iliac arteries, bilateral external iliac arteries, and bilateral hypogastric arteries are patent. 2. On the right side, which is the side of concern, the common femoral, deep femoral, superficial femoral, and popliteal arteries are patent. The anterior tibial artery on the right is occluded. The posterior tibial and peroneal arteries are patent. There is some reconstitution of the dorsalis pedis distally on the right. 3. On the left side, the common femoral, superficial femoral, and popliteal arteries are patent. The anterior tibial artery is occluded at the origin. There is two-vessel runoff on the left of the posterior tibial and peroneal arteries.  CLINICAL NOTE: He should have adequate circulation to heal the wound on the right foot.   ASSESSMENT:    1. Chronic combined systolic and diastolic heart failure, NYHA class 2 ([place])   2. Peripheral vascular disease ([place])   3. Diabetes mellitus due to underlying condition with hyperosmolarity without coma, without long-term current use of insulin ([place])   4. Coronary artery disease involving coronary bypass graft of native heart with angina pectoris  ([place])      PLAN:  In order of problems listed above:  1. Stable without evidence of volume overload. 2. Noted above, PAD. The ulcer on the right great toe is healed. He is aware of the  power of diabetes control and also the risk of limb loss if not well controlled. Continue in the wound clinic and continue with screw pill a observation for skin breakdown/injury with early reporting 3. We spent much time discussing the importance of tight diabetes control. This is his greatest risk factor. He now seems to understand this. 4. Stable angina pectoris. Nitroglycerin if prolonged discomfort. If prolonged discomfort or increased episodes of angina he should report.  Long-term we discussed the importance of tight diabetes control, diet, weight loss, and reporting any new cardiovascular symptoms. Uncle follow-up in one year.    Medication Adjustments/Labs and Tests Ordered: Current medicines are reviewed at length with the patient today.  Concerns regarding medicines are outlined above.  Medication changes, Labs and Tests ordered today are listed in the Patient Instructions below. Patient Instructions  Medication Instructions:  None  Labwork: None  Testing/Procedures: None  Follow-Up: Your physician wants you to follow-up in: 1 year with Dr. Tamala Julian. You will receive a reminder letter in the mail two months in advance. If you don't receive a letter, please call our office to schedule the follow-up appointment.   Any Other Special Instructions Will Be Listed Below (If Applicable).     If you need a refill on your cardiac medications before your next appointment, please call your pharmacy.      Signed, [name], MD  08/28/2016 6:05 PM    [address], [place], [place]  [address] Phone: [phone]; Fax: [phone]

## 2016-08-28 NOTE — Patient Instructions (Signed)

## 2016-09-05 ENCOUNTER — Other Ambulatory Visit: Payer: Self-pay | Admitting: Interventional Cardiology

## 2016-09-07 ENCOUNTER — Ambulatory Visit (INDEPENDENT_AMBULATORY_CARE_PROVIDER_SITE_OTHER): Payer: Medicare HMO | Admitting: Orthopaedic Surgery

## 2016-09-07 ENCOUNTER — Encounter (INDEPENDENT_AMBULATORY_CARE_PROVIDER_SITE_OTHER): Payer: Self-pay | Admitting: Orthopaedic Surgery

## 2016-09-07 VITALS — BP 151/94 | HR 74 | Resp 14 | Ht 72.0 in | Wt 232.0 lb

## 2016-09-07 DIAGNOSIS — G8929 Other chronic pain: Secondary | ICD-10-CM

## 2016-09-07 DIAGNOSIS — M79675 Pain in left toe(s): Secondary | ICD-10-CM

## 2016-09-07 NOTE — Progress Notes (Deleted)
Consult Note   Patient: Justin Blackburn             Date of Birth: [birth year]           MRN: [id number]             Visit Date: 09/07/2016  Requested by: No referring provider defined for this encounter. PCP: [facility]  Thank you for asking me to evaluate this patient for No chief complaint on file.  Below are my findings.     Assessment & Plan: Visit Diagnoses: No diagnosis found.  Plan: ***  Follow Up Instructions: No Follow-up on file.  Orders: No orders of the defined types were placed in this encounter.  No orders of the defined types were placed in this encounter.    Procedures: No procedures performed   Clinical Data: No additional findings.   Subjective:  No chief complaint on file.    HPI  Review of Systems   Objective: Vital Signs: There were no vitals taken for this visit.  Physical Exam   Ortho Exam   Specialty Comments:  No specialty comments available.  Imaging:  No results found.   PMFS History: Patient Active Problem List   Diagnosis Date Noted  . Coronary artery disease involving coronary bypass graft of native heart with angina pectoris ([place]) 08/28/2016  . 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 Anticoagulated 02/06/2015  . Acute on chronic combined systolic and diastolic heart failure ([place]) 01/30/2015  . Demand ischemia ([place]) 01/30/2015  . Atrial flutter with rapid ventricular response ([place]) 01/28/2015  . Chronic combined systolic and diastolic heart failure, NYHA class 2 ([place]) 04/17/2013  . Umbilical hernia   . Myocardial infarction   . Peripheral vascular disease ([place])   . Hypercholesteremia   . Hypertension 09/01/2011  . Diabetes mellitus ([place]) 09/01/2011   Past Medical History:  Diagnosis Date  . Angina   . Atrial flutter, paroxysmal ([place])    arrived with aflutter with RVR, EF down to 25-30%, s/p TEE DCCV on 01/29/2015  . Chronic lower back pain    "all the  time from my sciatic nerve"  . Erectile dysfunction   . Heart failure ([place])   . High cholesterol   . Hypercholesteremia   . Hypertension   . 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 Myocardial infarction 1988; ~1992;  ~1996;~ 2000  . Obesity   . Peripheral vascular disease ([place])   . Umbilical hernia    unrepaired    Family History  Problem Relation Age of Onset  . Heart disease Mother   . Heart attack Mother   . Heart disease Father   . Heart attack Father   . Heart attack Brother   . Stroke Neg Hx    Past Surgical History:  Procedure Laterality Date  . CARDIOVERSION N/A 01/29/2015   Procedure: CARDIOVERSION;  Surgeon: [name], MD;  Location: [facility];  Service: Cardiovascular;  Laterality: N/A;  . CORONARY ANGIOPLASTY WITH STENT PLACEMENT  08/31/11   "2 today; makes total of 3 stents"  . CORONARY ARTERY BYPASS GRAFT  1998; 2007   CABG X 4; CABG X4  . LEFT HEART CATHETERIZATION WITH CORONARY/GRAFT ANGIOGRAM N/A 08/31/2011   Procedure: LEFT HEART CATHETERIZATION WITH Beatrix Fetters;  Surgeon: [name], MD;  Location: [facility] CATH LAB;  Service: Cardiovascular;  Laterality: N/A;  . PERIPHERAL VASCULAR CATHETERIZATION N/A 04/27/2016   Procedure: Abdominal Aortogram w/Lower Extremity;  Surgeon: Angelia Mould, MD;  Location: [facility] CV LAB;  Service: Cardiovascular;  Laterality: N/A;  . TEE WITHOUT CARDIOVERSION N/A 01/29/2015   Procedure: TRANSESOPHAGEAL ECHOCARDIOGRAM (TEE);  Surgeon: [name], MD;  Location: [facility];  Service: Cardiovascular;  Laterality: N/A;   Social History   Occupational History  . Not on file.   Social History Main Topics  . Smoking status: Former Smoker    Packs/day: 2.00    Years: 6.00    Types: Cigarettes    Quit date: 06/16/1971  . Smokeless tobacco: Former Systems developer    Quit date: 06/15/1986     Comment: "chewed on cigars; never really did chew tobacco"  . Alcohol use No  . Drug use:  No  . Sexual activity: No

## 2016-09-07 NOTE — Progress Notes (Signed)
Office Visit Note   Patient: Justin Blackburn           Date of Birth: [birth year]           MRN: [id number] Visit Date: 09/07/2016              Requested by: No referring provider defined for this encounter. PCP: [facility]   Assessment & Plan: Visit Diagnoses: Diabetic peripheral vascular disease and neuropathy right foot with persistent ulceration right fourth toe. No evidence of  cellulitis or purulence.  Plan: Continue with local care of ulcer. Refer to wound clinic at [facility] .office 1 month.   Follow-Up Instructions: No Follow-up on file.   Orders:  No orders of the defined types were placed in this encounter.  No orders of the defined types were placed in this encounter.     Procedures: No procedures performed   Clinical Data: No additional findings.   Subjective: No chief complaint on file.   [name] has been followed over a period of many weeks for the diabetic issues with his right foot. He initially had an infection across the PIP joint of the second toe which resolved with doxycycline. He then developed   a second ulcer along the lateral aspect of his fourth toe. He notes he is doing well without fever or chills or He uses a dry dressing.  [name] has been evaluated by the vascular service who felt that he had enough surgical circulation to heal any wounds or amputations. He does have diabetic neuropathy and small vessel disease. Also has a feeling of the anterior tib artery the right foot. Recently seen by Dr. Tamala Julian and cardiology who felt that he was stable. (See cardiology note)  Review of Systems   Objective: Vital Signs: There were no vitals taken for this visit.  Physical Exam  Ortho Exam right foot swelling of the second and fourth toes. The toes are pink. No pain. Altered sensibility from diabetic neuropathy. Good capillary refill. Also across the PIP joint of the second toe is healed but toe is still swollen. No ascending  lymphangitis. Was extended above others no obvious present pressure. Mall ulcer lateral aspect of fourth toe at level of PIP joint essentially unchanged. No pain. No evidence of present cellulitis.  Specialty Comments:  No specialty comments available.  Imaging: No results found.   PMFS History: Patient Active Problem List   Diagnosis Date Noted  . Coronary artery disease involving coronary bypass graft of native heart with angina pectoris ([place]) 08/28/2016  . 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 Anticoagulated 02/06/2015  . Acute on chronic combined systolic and diastolic heart failure ([place]) 01/30/2015  . Demand ischemia ([place]) 01/30/2015  . Atrial flutter with rapid ventricular response ([place]) 01/28/2015  . Chronic combined systolic and diastolic heart failure, NYHA class 2 ([place]) 04/17/2013  . Umbilical hernia   . Myocardial infarction   . Peripheral vascular disease ([place])   . Hypercholesteremia   . Hypertension 09/01/2011  . Diabetes mellitus ([place]) 09/01/2011   Past Medical History:  Diagnosis Date  . Angina   . Atrial flutter, paroxysmal ([place])    arrived with aflutter with RVR, EF down to 25-30%, s/p TEE DCCV on 01/29/2015  . Chronic lower back pain    "all the time from my sciatic nerve"  . Erectile dysfunction   . Heart failure ([place])   . High cholesterol   . Hypercholesteremia   .  Hypertension   . 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 Myocardial infarction 1988; ~1992;  ~1996;~ 2000  . Obesity   . Peripheral vascular disease ([place])   . Umbilical hernia    unrepaired    Family History  Problem Relation Age of Onset  . Heart disease Mother   . Heart attack Mother   . Heart disease Father   . Heart attack Father   . Heart attack Brother   . Stroke Neg Hx     Past Surgical History:  Procedure Laterality Date  . CARDIOVERSION N/A 01/29/2015   Procedure: CARDIOVERSION;  Surgeon: [name], MD;   Location: [place];  Service: Cardiovascular;  Laterality: N/A;  . CORONARY ANGIOPLASTY WITH STENT PLACEMENT  08/31/11   "2 today; makes total of 3 stents"  . CORONARY ARTERY BYPASS GRAFT  1998; 2007   CABG X 4; CABG X4  . LEFT HEART CATHETERIZATION WITH CORONARY/GRAFT ANGIOGRAM N/A 08/31/2011   Procedure: LEFT HEART CATHETERIZATION WITH Beatrix Fetters;  Surgeon: [name], MD;  Location: [facility] CATH LAB;  Service: Cardiovascular;  Laterality: N/A;  . PERIPHERAL VASCULAR CATHETERIZATION N/A 04/27/2016   Procedure: Abdominal Aortogram w/Lower Extremity;  Surgeon: Angelia Mould, MD;  Location: [place] CV LAB;  Service: Cardiovascular;  Laterality: N/A;  . TEE WITHOUT CARDIOVERSION N/A 01/29/2015   Procedure: TRANSESOPHAGEAL ECHOCARDIOGRAM (TEE);  Surgeon: [name], MD;  Location: [place];  Service: Cardiovascular;  Laterality: N/A;   Social History   Occupational History  . Not on file.   Social History Main Topics  . Smoking status: Former Smoker    Packs/day: 2.00    Years: 6.00    Types: Cigarettes    Quit date: 06/16/1971  . Smokeless tobacco: Former Systems developer    Quit date: 06/15/1986     Comment: "chewed on cigars; never really did chew tobacco"  . Alcohol use No  . Drug use: No  . Sexual activity: No

## 2016-09-21 ENCOUNTER — Other Ambulatory Visit: Payer: Self-pay | Admitting: Interventional Cardiology

## 2016-09-21 NOTE — Telephone Encounter (Signed)
Received refill request for Eliquis 5mg ; pt was last seen by Dr. Tamala Julian on 08/28/16, age 66 years old Crea-0.9 on 04/27/16 by a Chem 8 report that was scanned into the pt's chart.  A note was placed on pt's chart to have labs drawn when he saw Dr. Tamala Julian on 08/28/16 but the labs were not ordered at that time, therefore, had to result to using Chem 8 labs that have been scanned into pt's chart.

## 2016-10-19 ENCOUNTER — Ambulatory Visit: Payer: Medicare HMO | Admitting: Surgery

## 2016-12-24 DIAGNOSIS — E113511 Type 2 diabetes mellitus with proliferative diabetic retinopathy with macular edema, right eye: Secondary | ICD-10-CM | POA: Diagnosis not present

## 2016-12-24 DIAGNOSIS — E113512 Type 2 diabetes mellitus with proliferative diabetic retinopathy with macular edema, left eye: Secondary | ICD-10-CM | POA: Diagnosis not present

## 2016-12-30 ENCOUNTER — Telehealth: Payer: Self-pay | Admitting: Interventional Cardiology

## 2016-12-30 MED ORDER — PRAVASTATIN SODIUM 40 MG PO TABS: 40.0000 mg | ORAL_TABLET | Freq: Every day | ORAL | 7 refills | Status: DC

## 2016-12-30 NOTE — Telephone Encounter (Signed)
[facility] requesting a refill on pravastatin 40 mg tablet. Dr. Tamala Julian did not prescribe this medication. Would you like to refill this medication? Please advise

## 2016-12-30 NOTE — Telephone Encounter (Signed)
Ok to fill 

## 2016-12-30 NOTE — Telephone Encounter (Signed)
Pt's Rx was sent to pt's pharmacy as requested. Confirmation received.  °

## 2017-01-26 DIAGNOSIS — E113513 Type 2 diabetes mellitus with proliferative diabetic retinopathy with macular edema, bilateral: Secondary | ICD-10-CM | POA: Diagnosis not present

## 2017-03-02 DIAGNOSIS — E113313 Type 2 diabetes mellitus with moderate nonproliferative diabetic retinopathy with macular edema, bilateral: Secondary | ICD-10-CM | POA: Diagnosis not present

## 2017-03-09 DIAGNOSIS — I739 Peripheral vascular disease, unspecified: Secondary | ICD-10-CM | POA: Diagnosis not present

## 2017-03-09 DIAGNOSIS — E782 Mixed hyperlipidemia: Secondary | ICD-10-CM | POA: Diagnosis not present

## 2017-03-09 DIAGNOSIS — I2510 Atherosclerotic heart disease of native coronary artery without angina pectoris: Secondary | ICD-10-CM | POA: Diagnosis not present

## 2017-03-09 DIAGNOSIS — L84 Corns and callosities: Secondary | ICD-10-CM | POA: Diagnosis not present

## 2017-03-09 DIAGNOSIS — I10 Essential (primary) hypertension: Secondary | ICD-10-CM | POA: Diagnosis not present

## 2017-03-09 DIAGNOSIS — E1140 Type 2 diabetes mellitus with diabetic neuropathy, unspecified: Secondary | ICD-10-CM | POA: Diagnosis not present

## 2017-03-09 DIAGNOSIS — Z6830 Body mass index (BMI) 30.0-30.9, adult: Secondary | ICD-10-CM | POA: Diagnosis not present

## 2017-03-09 DIAGNOSIS — Z125 Encounter for screening for malignant neoplasm of prostate: Secondary | ICD-10-CM | POA: Diagnosis not present

## 2017-04-06 DIAGNOSIS — E113313 Type 2 diabetes mellitus with moderate nonproliferative diabetic retinopathy with macular edema, bilateral: Secondary | ICD-10-CM | POA: Diagnosis not present

## 2017-05-03 DIAGNOSIS — E113313 Type 2 diabetes mellitus with moderate nonproliferative diabetic retinopathy with macular edema, bilateral: Secondary | ICD-10-CM | POA: Diagnosis not present

## 2017-06-14 DIAGNOSIS — E113313 Type 2 diabetes mellitus with moderate nonproliferative diabetic retinopathy with macular edema, bilateral: Secondary | ICD-10-CM | POA: Diagnosis not present

## 2017-07-13 ENCOUNTER — Other Ambulatory Visit: Payer: Self-pay | Admitting: Interventional Cardiology

## 2017-07-13 DIAGNOSIS — I2510 Atherosclerotic heart disease of native coronary artery without angina pectoris: Secondary | ICD-10-CM | POA: Diagnosis not present

## 2017-07-13 DIAGNOSIS — Z79899 Other long term (current) drug therapy: Secondary | ICD-10-CM | POA: Diagnosis not present

## 2017-07-13 DIAGNOSIS — I10 Essential (primary) hypertension: Secondary | ICD-10-CM | POA: Diagnosis not present

## 2017-07-13 DIAGNOSIS — I739 Peripheral vascular disease, unspecified: Secondary | ICD-10-CM | POA: Diagnosis not present

## 2017-07-13 DIAGNOSIS — E1140 Type 2 diabetes mellitus with diabetic neuropathy, unspecified: Secondary | ICD-10-CM | POA: Diagnosis not present

## 2017-07-13 DIAGNOSIS — E782 Mixed hyperlipidemia: Secondary | ICD-10-CM | POA: Diagnosis not present

## 2017-07-13 DIAGNOSIS — Z6830 Body mass index (BMI) 30.0-30.9, adult: Secondary | ICD-10-CM | POA: Diagnosis not present

## 2017-07-13 DIAGNOSIS — I480 Paroxysmal atrial fibrillation: Secondary | ICD-10-CM | POA: Diagnosis not present

## 2017-07-14 NOTE — Telephone Encounter (Addendum)
Called spoke with Apolonio Schneiders at [facility] requested most recent labwork.  She states pt just had labs drawn this month, she will fax results. Pt last saw Dr Tamala Julian 08/28/16, age 67, weight 105.2kg, will awit lab results to ensure pt is on appropriate dosage of Eliquis 5mg  BID, then we will refill rx.   Labwork received Creat 1.24 on 07/13/17, based on specified criteria pt is on appropriate dosage of Eliquis 5mg  BID.  Will refill rx.

## 2017-07-27 DIAGNOSIS — E113313 Type 2 diabetes mellitus with moderate nonproliferative diabetic retinopathy with macular edema, bilateral: Secondary | ICD-10-CM | POA: Diagnosis not present

## 2017-08-04 ENCOUNTER — Telehealth: Payer: Self-pay | Admitting: Interventional Cardiology

## 2017-08-04 NOTE — Telephone Encounter (Signed)
Wife called for patient.  He is getting SOB, with any activity cant walk very far (can't walk across the yard with out having to sit down.  No SOB at rest.  Left arm pain, shoulder and chest tightness with any stress.    This has been going on about 3-4 weeks.   Can't breath laying flat, has been propped up in recliner (says due to sinus problems)  Legs swell daily but better in am.  History of CAD, last seen by Dr. Tamala Julian March 2018.  Advised patient will review with Dr. Tamala Julian and call him back.

## 2017-08-04 NOTE — Telephone Encounter (Signed)
New Message   Pt c/o of Chest Pain: STAT if CP now or developed within 24 hours  1. Are you having CP right now? yes  2. Are you experiencing any other symptoms (ex. SOB, nausea, vomiting, sweating)? Can not walk 25 ft without having to sit down with some SOB, As well as pain in arm  3. How long have you been experiencing CP? 2-3 weeks became more intense today  4. Is your CP continuous or coming and going? Coming and going   5. Have you taken Nitroglycerin? no ?

## 2017-08-04 NOTE — Telephone Encounter (Signed)
Per Dr. Tamala Julian added to APP schedule tomorrow.  Reminded patient to use NTG if symptoms do not resolve with rest.  Tried to review medications.  Pt states "i'm taking everything they give me".  Asked him to bring the bottles or a current list of meds with him tomorrow.  Advised if uses NTG and CP does not resolve he needs to go to ED for evaluation.

## 2017-08-05 ENCOUNTER — Encounter: Payer: Self-pay | Admitting: Cardiology

## 2017-08-05 ENCOUNTER — Encounter: Payer: Self-pay | Admitting: *Deleted

## 2017-08-05 ENCOUNTER — Ambulatory Visit: Payer: Medicare HMO | Admitting: Cardiology

## 2017-08-05 VITALS — BP 120/68 | HR 71 | Ht 72.0 in | Wt 219.8 lb

## 2017-08-05 DIAGNOSIS — I200 Unstable angina: Secondary | ICD-10-CM | POA: Diagnosis not present

## 2017-08-05 MED ORDER — CARVEDILOL 12.5 MG PO TABS: 12.5000 mg | ORAL_TABLET | Freq: Two times a day (BID) | ORAL | 3 refills | Status: DC

## 2017-08-05 NOTE — Progress Notes (Signed)
08/05/2017 Justin Blackburn   1951/03/21  [id number]  Primary Physician Practice, [facility] Primary Cardiologist: Dr. Tamala Julian   Reason for Visit/CC: Unstable Angina   HPI:  Justin Blackburn is a 67 y.o. male who is being seen today for the evaluation of exertional CP c/w unstable angina. He is followed by Dr. Tamala Julian and has significant history of CAD. He underwent CABG in 1998 and had redo bypass again in 2007. His last LHC in 2013 showed advanced coronary atherosclerotic disease with total occlusion of the LAD and native right coronary. Total occlusion of the circumflex after the large first obtuse marginal. The obtuse marginal #1 contained an eccentric 80-90% stenosis. Widely patent right internal mammary and left internal mammary graft to the right coronary and LAD respectively.  He underwent successful stenting of the obtuse marginal to 0% with TIMI grade 3 flow. Ff at that time was 40%. Additional medical problems include HTN, DM, HLD, PAF and chronic anticoagulation therapy with Eliquis.   He presents to clinic today with complaints of exertional bilateral shoulder and substernal chest pain. Similar to previous angina. Symptoms have been present for 1-2 months and gradually getting worse. He develops symptoms with walking at a moderate pace that improves with rest. He has not required SL NTG and denies any resting symptoms. However his episodes are increasing in frequency. His wife notes that he often has to stop and rest if he tries to do anything light around the house. He is currently CP free. He reports full med compliance. BP is 120/68. Pulse rate in the 70s. EKG shows NSR.    Current Meds  Medication Sig  . aspirin EC 81 MG tablet Take 1 tablet (81 mg total) by mouth daily.  [name] atorvastatin (LIPITOR) 40 MG tablet Take 40 mg by mouth daily.  . carvedilol (COREG) 6.25 MG tablet Take 6.25 mg by mouth 2 (two) times daily with a meal.  . clopidogrel (PLAVIX) 75 MG tablet Take 1 tablet  by mouth daily.  [name] ELIQUIS 5 MG TABS tablet TAKE 1 TABLET BY MOUTH TWICE A DAY  . ferrous sulfate 325 (65 FE) MG tablet Take 325 mg by mouth daily.  . furosemide (LASIX) 40 MG tablet Take 1 tablet (40 mg total) by mouth daily.  [name] gabapentin (NEURONTIN) 300 MG capsule Take 300 mg by mouth 2 (two) times daily.  [name] LANTUS SOLOSTAR 100 UNIT/ML Solostar Pen Inject 75 Units into the skin daily.  [name] losartan-hydrochlorothiazide (HYZAAR) 100-12.5 MG tablet TAKE 1 (ONE) TABLET BY MOUTH DAILY FOR HIGH BP  . metFORMIN (GLUCOPHAGE) 1000 MG tablet Take 1,000 mg by mouth 2 (two) times daily.  . sitaGLIPtin (JANUVIA) 100 MG tablet Take 100 mg by mouth daily.  . [DISCONTINUED] clindamycin (CLEOCIN) 300 MG capsule TAKE 2 CAPSULES BY MOUTH THREE TIMES DAILY FOR 10 DAYS FOR BONE INFECTION  . [DISCONTINUED] Olmesartan-Amlodipine-HCTZ 40-5-12.5 MG TABS TAKE 1 TABLET BY MOUTH EVERY DAY  . [DISCONTINUED] pravastatin (PRAVACHOL) 40 MG tablet Take 1 tablet (40 mg total) by mouth at bedtime.   No Known Allergies Past Medical History:  Diagnosis Date  . Angina   . Atrial flutter, paroxysmal ([place])    arrived with aflutter with RVR, EF down to 25-30%, s/p TEE DCCV on 01/29/2015  . Chronic lower back pain    "all the time from my sciatic nerve"  . Erectile dysfunction   . Heart failure ([place])   . High cholesterol   . Hypercholesteremia   . Hypertension   .  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 Myocardial infarction ([place]) 1988; ~7341;  ~1996;~ 2000  . Obesity   . Peripheral vascular disease ([place])   . Umbilical hernia    unrepaired   Family History  Problem Relation Age of Onset  . Heart disease Mother   . Heart attack Mother   . Heart disease Father   . Heart attack Father   . Heart attack Brother   . Stroke Neg Hx    Past Surgical History:  Procedure Laterality Date  . CARDIOVERSION N/A 01/29/2015   Procedure: CARDIOVERSION;  Surgeon: [name], MD;  Location: [place];   Service: Cardiovascular;  Laterality: N/A;  . CORONARY ANGIOPLASTY WITH STENT PLACEMENT  08/31/11   "2 today; makes total of 3 stents"  . CORONARY ARTERY BYPASS GRAFT  1998; 2007   CABG X 4; CABG X4  . LEFT HEART CATHETERIZATION WITH CORONARY/GRAFT ANGIOGRAM N/A 08/31/2011   Procedure: LEFT HEART CATHETERIZATION WITH Beatrix Fetters;  Surgeon: [name], MD;  Location: [facility] CATH LAB;  Service: Cardiovascular;  Laterality: N/A;  . PERIPHERAL VASCULAR CATHETERIZATION N/A 04/27/2016   Procedure: Abdominal Aortogram w/Lower Extremity;  Surgeon: Angelia Mould, MD;  Location: [place] CV LAB;  Service: Cardiovascular;  Laterality: N/A;  . TEE WITHOUT CARDIOVERSION N/A 01/29/2015   Procedure: TRANSESOPHAGEAL ECHOCARDIOGRAM (TEE);  Surgeon: [name], MD;  Location: [facility] ENDOSCOPY;  Service: Cardiovascular;  Laterality: N/A;   Social History   Socioeconomic History  . Marital status: Married    Spouse name: Not on file  . Number of children: Not on file  . Years of education: Not on file  . Highest education level: Not on file  Social Needs  . Financial resource strain: Not on file  . Food insecurity - worry: Not on file  . Food insecurity - inability: Not on file  . Transportation needs - medical: Not on file  . Transportation needs - non-medical: Not on file  Occupational History  . Not on file  Tobacco Use  . Smoking status: Former Smoker    Packs/day: 2.00    Years: 6.00    Pack years: 12.00    Types: Cigarettes    Last attempt to quit: 06/16/1971    Years since quitting: 46.1  . Smokeless tobacco: Former Systems developer    Quit date: 06/15/1986  . Tobacco comment: "chewed on cigars; never really did chew tobacco"  Substance and Sexual Activity  . Alcohol use: No  . Drug use: No  . Sexual activity: No  Other Topics Concern  . Not on file  Social History Narrative  . Not on file     Review of Systems: General: negative for chills, fever, night sweats or  weight changes.  Cardiovascular: negative for chest pain, dyspnea on exertion, edema, orthopnea, palpitations, paroxysmal nocturnal dyspnea or shortness of breath Dermatological: negative for rash Respiratory: negative for cough or wheezing Urologic: negative for hematuria Abdominal: negative for nausea, vomiting, diarrhea, bright red blood per rectum, melena, or hematemesis Neurologic: negative for visual changes, syncope, or dizziness All other systems reviewed and are otherwise negative except as noted above.   Physical Exam:  Blood pressure 120/68, pulse 71, height 6' (1.829 m), weight 219 lb 12.8 oz (99.7 kg), SpO2 97 %.  General appearance: alert, cooperative and no distress Neck: no carotid bruit and no JVD Lungs: clear to auscultation bilaterally Heart: regular rate and rhythm, S1, S2 normal, no murmur, click, rub or gallop Extremities: extremities  normal, atraumatic, no cyanosis or edema Pulses: 2+ and symmetric Skin: Skin color, texture, turgor normal. No rashes or lesions Neurologic: Grossly normal  EKG NSR nonspecific intraventricular block, -- personally reviewed   ASSESSMENT AND PLAN:   1. Unstable Angina: known CAD with h/o CABG in 1998 followed by redo bypass in 2007 and PCI with coronary stenting in 2013, as outlined above,. He has classic symptoms of unstable angina. New exertional chest discomfort with mild-moderate activity that improves with rest and increasing in frequency. EKG shows no acute ST abnormalities. BP is stable. There is room in both BP and HR to increase his BB, Coreg, to 12.5 mg BID. He has SL NTG at home for emergency use and we discussed proper use and when to go to the ED. We will set up for outpatient LHC +/- PCI. Cath schedule reviewed. Dr. Tamala Julian not in cath lab next week. Given increasing frequency in symptoms we have opted to do cath sooner than later. He is on Eliquis and will need to hold x 48 hrs. Will schedule early next week with Dr. Angelena Form.  Pt is in agreement. I have reviewed the risks, indications, and alternatives to cardiac catheterization and possible angioplasty/stenting with the patient. Risks include but are not limited to bleeding, infection, vascular injury, stroke, myocardial infection, arrhythmia, kidney injury, radiation-related injury in the case of prolonged fluoroscopy use, emergency cardiac surgery, and death. The patient understands the risks of serious complication is low (<6%).Pprecath labs will be ordered today. In addition on Eliquis, pt also instructed to hold Metformin, Lasix and losartan prior to cath.   2. PAF: NSR on EKG. Continue Coreg and Eliquis. Eliquis will be held 48 hrs pre cath.   3. HTN: controlled on current regimen.   4. DM: management per PCP.   5. HLD: on statin therapy.   Follow-Up post cath.   Tanairi Cypert Ladoris Gene, MHS [facility] 08/05/2017 12:06 PM

## 2017-08-05 NOTE — Patient Instructions (Addendum)
Medication Instructions:   START TAKING CARVEDILOL 12.5 MG TWICE A DAY     If you need a refill on your cardiac medications before your next appointment, please call your pharmacy.  Labwork:  BMET CBC PT-PTT TODAY    Testing/Procedures: SEE LETTER FOR LEFT HEART CATH  ON  08-10-17 WITH DR Angelena Form    Follow-Up:  POST CATH FOLLOW UP  WILL BE DETERMINED AFTER CATH   Any Other Special Instructions Will Be Listed Below (If Applicable).

## 2017-08-05 NOTE — H&P (View-Only) (Signed)
08/05/2017 Justin Blackburn   16-Aug-1950  [id number]  Primary Physician Practice, [facility] Primary Cardiologist: Dr. Tamala Julian   Reason for Visit/CC: Unstable Angina   HPI:  Justin Blackburn is a 67 y.o. male who is being seen today for the evaluation of exertional CP c/w unstable angina. He is followed by Dr. Tamala Julian and has significant history of CAD. He underwent CABG in 1998 and had redo bypass again in 2007. His last LHC in 2013 showed advanced coronary atherosclerotic disease with total occlusion of the LAD and native right coronary. Total occlusion of the circumflex after the large first obtuse marginal. The obtuse marginal #1 contained an eccentric 80-90% stenosis. Widely patent right internal mammary and left internal mammary graft to the right coronary and LAD respectively.  He underwent successful stenting of the obtuse marginal to 0% with TIMI grade 3 flow. Ff at that time was 40%. Additional medical problems include HTN, DM, HLD, PAF and chronic anticoagulation therapy with Eliquis.   He presents to clinic today with complaints of exertional bilateral shoulder and substernal chest pain. Similar to previous angina. Symptoms have been present for 1-2 months and gradually getting worse. He develops symptoms with walking at a moderate pace that improves with rest. He has not required SL NTG and denies any resting symptoms. However his episodes are increasing in frequency. His wife notes that he often has to stop and rest if he tries to do anything light around the house. He is currently CP free. He reports full med compliance. BP is 120/68. Pulse rate in the 70s. EKG shows NSR.    Current Meds  Medication Sig  . aspirin EC 81 MG tablet Take 1 tablet (81 mg total) by mouth daily.  [name] atorvastatin (LIPITOR) 40 MG tablet Take 40 mg by mouth daily.  . carvedilol (COREG) 6.25 MG tablet Take 6.25 mg by mouth 2 (two) times daily with a meal.  . clopidogrel (PLAVIX) 75 MG tablet Take 1 tablet  by mouth daily.  [name] ELIQUIS 5 MG TABS tablet TAKE 1 TABLET BY MOUTH TWICE A DAY  . ferrous sulfate 325 (65 FE) MG tablet Take 325 mg by mouth daily.  . furosemide (LASIX) 40 MG tablet Take 1 tablet (40 mg total) by mouth daily.  [name] gabapentin (NEURONTIN) 300 MG capsule Take 300 mg by mouth 2 (two) times daily.  [name] LANTUS SOLOSTAR 100 UNIT/ML Solostar Pen Inject 75 Units into the skin daily.  [name] losartan-hydrochlorothiazide (HYZAAR) 100-12.5 MG tablet TAKE 1 (ONE) TABLET BY MOUTH DAILY FOR HIGH BP  . metFORMIN (GLUCOPHAGE) 1000 MG tablet Take 1,000 mg by mouth 2 (two) times daily.  . sitaGLIPtin (JANUVIA) 100 MG tablet Take 100 mg by mouth daily.  . [DISCONTINUED] clindamycin (CLEOCIN) 300 MG capsule TAKE 2 CAPSULES BY MOUTH THREE TIMES DAILY FOR 10 DAYS FOR BONE INFECTION  . [DISCONTINUED] Olmesartan-Amlodipine-HCTZ 40-5-12.5 MG TABS TAKE 1 TABLET BY MOUTH EVERY DAY  . [DISCONTINUED] pravastatin (PRAVACHOL) 40 MG tablet Take 1 tablet (40 mg total) by mouth at bedtime.   No Known Allergies Past Medical History:  Diagnosis Date  . Angina   . Atrial flutter, paroxysmal ([place])    arrived with aflutter with RVR, EF down to 25-30%, s/p TEE DCCV on 01/29/2015  . Chronic lower back pain    "all the time from my sciatic nerve"  . Erectile dysfunction   . Heart failure ([place])   . High cholesterol   . Hypercholesteremia   . Hypertension   .  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 Myocardial infarction ([place]) 1988; ~1660;  ~1996;~ 2000  . Obesity   . Peripheral vascular disease ([place])   . Umbilical hernia    unrepaired   Family History  Problem Relation Age of Onset  . Heart disease Mother   . Heart attack Mother   . Heart disease Father   . Heart attack Father   . Heart attack Brother   . Stroke Neg Hx    Past Surgical History:  Procedure Laterality Date  . CARDIOVERSION N/A 01/29/2015   Procedure: CARDIOVERSION;  Surgeon: [name], MD;  Location: [facility];   Service: Cardiovascular;  Laterality: N/A;  . CORONARY ANGIOPLASTY WITH STENT PLACEMENT  08/31/11   "2 today; makes total of 3 stents"  . CORONARY ARTERY BYPASS GRAFT  1998; 2007   CABG X 4; CABG X4  . LEFT HEART CATHETERIZATION WITH CORONARY/GRAFT ANGIOGRAM N/A 08/31/2011   Procedure: LEFT HEART CATHETERIZATION WITH Beatrix Fetters;  Surgeon: [name], MD;  Location: [facility] CATH LAB;  Service: Cardiovascular;  Laterality: N/A;  . PERIPHERAL VASCULAR CATHETERIZATION N/A 04/27/2016   Procedure: Abdominal Aortogram w/Lower Extremity;  Surgeon: Angelia Mould, MD;  Location: [facility] CV LAB;  Service: Cardiovascular;  Laterality: N/A;  . TEE WITHOUT CARDIOVERSION N/A 01/29/2015   Procedure: TRANSESOPHAGEAL ECHOCARDIOGRAM (TEE);  Surgeon: [name], MD;  Location: [facility] ENDOSCOPY;  Service: Cardiovascular;  Laterality: N/A;   Social History   Socioeconomic History  . Marital status: Married    Spouse name: Not on file  . Number of children: Not on file  . Years of education: Not on file  . Highest education level: Not on file  Social Needs  . Financial resource strain: Not on file  . Food insecurity - worry: Not on file  . Food insecurity - inability: Not on file  . Transportation needs - medical: Not on file  . Transportation needs - non-medical: Not on file  Occupational History  . Not on file  Tobacco Use  . Smoking status: Former Smoker    Packs/day: 2.00    Years: 6.00    Pack years: 12.00    Types: Cigarettes    Last attempt to quit: 06/16/1971    Years since quitting: 46.1  . Smokeless tobacco: Former Systems developer    Quit date: 06/15/1986  . Tobacco comment: "chewed on cigars; never really did chew tobacco"  Substance and Sexual Activity  . Alcohol use: No  . Drug use: No  . Sexual activity: No  Other Topics Concern  . Not on file  Social History Narrative  . Not on file     Review of Systems: General: negative for chills, fever, night sweats or  weight changes.  Cardiovascular: negative for chest pain, dyspnea on exertion, edema, orthopnea, palpitations, paroxysmal nocturnal dyspnea or shortness of breath Dermatological: negative for rash Respiratory: negative for cough or wheezing Urologic: negative for hematuria Abdominal: negative for nausea, vomiting, diarrhea, bright red blood per rectum, melena, or hematemesis Neurologic: negative for visual changes, syncope, or dizziness All other systems reviewed and are otherwise negative except as noted above.   Physical Exam:  Blood pressure 120/68, pulse 71, height 6' (1.829 m), weight 219 lb 12.8 oz (99.7 kg), SpO2 97 %.  General appearance: alert, cooperative and no distress Neck: no carotid bruit and no JVD Lungs: clear to auscultation bilaterally Heart: regular rate and rhythm, S1, S2 normal, no murmur, click, rub or gallop Extremities: extremities  normal, atraumatic, no cyanosis or edema Pulses: 2+ and symmetric Skin: Skin color, texture, turgor normal. No rashes or lesions Neurologic: Grossly normal  EKG NSR nonspecific intraventricular block, -- personally reviewed   ASSESSMENT AND PLAN:   1. Unstable Angina: known CAD with h/o CABG in 1998 followed by redo bypass in 2007 and PCI with coronary stenting in 2013, as outlined above,. He has classic symptoms of unstable angina. New exertional chest discomfort with mild-moderate activity that improves with rest and increasing in frequency. EKG shows no acute ST abnormalities. BP is stable. There is room in both BP and HR to increase his BB, Coreg, to 12.5 mg BID. He has SL NTG at home for emergency use and we discussed proper use and when to go to the ED. We will set up for outpatient LHC +/- PCI. Cath schedule reviewed. Dr. Tamala Julian not in cath lab next week. Given increasing frequency in symptoms we have opted to do cath sooner than later. He is on Eliquis and will need to hold x 48 hrs. Will schedule early next week with Dr. Angelena Form.  Pt is in agreement. I have reviewed the risks, indications, and alternatives to cardiac catheterization and possible angioplasty/stenting with the patient. Risks include but are not limited to bleeding, infection, vascular injury, stroke, myocardial infection, arrhythmia, kidney injury, radiation-related injury in the case of prolonged fluoroscopy use, emergency cardiac surgery, and death. The patient understands the risks of serious complication is low (<7%).Pprecath labs will be ordered today. In addition on Eliquis, pt also instructed to hold Metformin, Lasix and losartan prior to cath.   2. PAF: NSR on EKG. Continue Coreg and Eliquis. Eliquis will be held 48 hrs pre cath.   3. HTN: controlled on current regimen.   4. DM: management per PCP.   5. HLD: on statin therapy.   Follow-Up post cath.   Brittainy Ladoris Gene, MHS [facility] 08/05/2017 12:06 PM

## 2017-08-06 LAB — BASIC METABOLIC PANEL
BUN / CREAT RATIO: 12 (ref 10–24)
BUN: 17 mg/dL (ref 8–27)
CO2: 21 mmol/L (ref 20–29)
CREATININE: 1.44 mg/dL — AB (ref 0.76–1.27)
Calcium: 9.7 mg/dL (ref 8.6–10.2)
Chloride: 103 mmol/L (ref 96–106)
GFR, EST AFRICAN AMERICAN: 57 mL/min/{1.73_m2} — AB (ref >59)
GFR, EST NON AFRICAN AMERICAN: 49 mL/min/{1.73_m2} — AB (ref >59)
Glucose: 192 mg/dL — ABNORMAL HIGH (ref 65–99)
POTASSIUM: 4.3 mmol/L (ref 3.5–5.2)
SODIUM: 139 mmol/L (ref 134–144)

## 2017-08-06 LAB — PT AND PTT
INR: 1.2 (ref 0.8–1.2)
Prothrombin Time: 12.1 s — ABNORMAL HIGH (ref 9.1–12.0)
aPTT: 29 s (ref 24–33)

## 2017-08-06 LAB — CBC
Hematocrit: 33.5 % — ABNORMAL LOW (ref 37.5–51.0)
Hemoglobin: 10.1 g/dL — ABNORMAL LOW (ref 13.0–17.7)
MCH: 22.1 pg — AB (ref 26.6–33.0)
MCHC: 30.1 g/dL — AB (ref 31.5–35.7)
MCV: 73 fL — AB (ref 79–97)
PLATELETS: 410 10*3/uL — AB (ref 150–379)
RBC: 4.58 x10E6/uL (ref 4.14–5.80)
RDW: 17 % — AB (ref 12.3–15.4)
WBC: 9.5 10*3/uL (ref 3.4–10.8)

## 2017-08-09 ENCOUNTER — Telehealth: Payer: Self-pay | Admitting: *Deleted

## 2017-08-09 NOTE — Telephone Encounter (Signed)
Pt contacted pre-catheterization scheduled at [facility] for: Tuesday February 26,2019 11 AM Verified arrival time and place: [place] at: 9AM Verified no known allergies.  HOLD: No Eliquis 08/08/17, 08/09/17, until after cath 08/10/17 No metformin 08/09/17, 08/10/17 and 48 hours after cath. No Januvia AM of cath No Insulin AM of cath. No lasix AM of cath. No lisinopril-HCTZ AM of cath  Except hold medications AM meds can be  taken pre-cath with sip of water including: ASA am of cath. Plavix am of cath.   Confirmed patient has responsible person to drive home post procedure and observe patient for 24 hours: yes

## 2017-08-10 ENCOUNTER — Other Ambulatory Visit: Payer: Self-pay

## 2017-08-10 ENCOUNTER — Encounter (HOSPITAL_COMMUNITY): Payer: Self-pay | Admitting: General Practice

## 2017-08-10 ENCOUNTER — Ambulatory Visit (HOSPITAL_COMMUNITY)
Admission: RE | Admit: 2017-08-10 | Discharge: 2017-08-11 | Disposition: A | Discharge status: Home / Self Care | Payer: Medicare HMO | Source: Ambulatory Visit | Attending: Cardiovascular Disease | Admitting: Cardiovascular Disease

## 2017-08-10 ENCOUNTER — Ambulatory Visit (HOSPITAL_COMMUNITY)
Admission: RE | Disposition: A | Discharge status: Home / Self Care | Payer: Self-pay | Source: Ambulatory Visit | Attending: Cardiovascular Disease

## 2017-08-10 DIAGNOSIS — I509 Heart failure, unspecified: Secondary | ICD-10-CM | POA: Insufficient documentation

## 2017-08-10 DIAGNOSIS — Z6829 Body mass index (BMI) 29.0-29.9, adult: Secondary | ICD-10-CM | POA: Insufficient documentation

## 2017-08-10 DIAGNOSIS — I480 Paroxysmal atrial fibrillation: Secondary | ICD-10-CM | POA: Diagnosis not present

## 2017-08-10 DIAGNOSIS — Z7901 Long term (current) use of anticoagulants: Secondary | ICD-10-CM | POA: Diagnosis not present

## 2017-08-10 DIAGNOSIS — I2582 Chronic total occlusion of coronary artery: Secondary | ICD-10-CM | POA: Insufficient documentation

## 2017-08-10 DIAGNOSIS — Z955 Presence of coronary angioplasty implant and graft: Secondary | ICD-10-CM

## 2017-08-10 DIAGNOSIS — I25118 Atherosclerotic heart disease of native coronary artery with other forms of angina pectoris: Secondary | ICD-10-CM | POA: Diagnosis not present

## 2017-08-10 DIAGNOSIS — Z794 Long term (current) use of insulin: Secondary | ICD-10-CM | POA: Diagnosis not present

## 2017-08-10 DIAGNOSIS — Z79899 Other long term (current) drug therapy: Secondary | ICD-10-CM | POA: Diagnosis not present

## 2017-08-10 DIAGNOSIS — E785 Hyperlipidemia, unspecified: Secondary | ICD-10-CM | POA: Insufficient documentation

## 2017-08-10 DIAGNOSIS — E1151 Type 2 diabetes mellitus with diabetic peripheral angiopathy without gangrene: Secondary | ICD-10-CM | POA: Diagnosis not present

## 2017-08-10 DIAGNOSIS — Z7902 Long term (current) use of antithrombotics/antiplatelets: Secondary | ICD-10-CM | POA: Diagnosis not present

## 2017-08-10 DIAGNOSIS — I25810 Atherosclerosis of coronary artery bypass graft(s) without angina pectoris: Secondary | ICD-10-CM | POA: Diagnosis present

## 2017-08-10 DIAGNOSIS — I252 Old myocardial infarction: Secondary | ICD-10-CM | POA: Diagnosis not present

## 2017-08-10 DIAGNOSIS — I110 Hypertensive heart disease with heart failure: Secondary | ICD-10-CM | POA: Insufficient documentation

## 2017-08-10 DIAGNOSIS — E7800 Pure hypercholesterolemia, unspecified: Secondary | ICD-10-CM | POA: Insufficient documentation

## 2017-08-10 DIAGNOSIS — I4892 Unspecified atrial flutter: Secondary | ICD-10-CM | POA: Diagnosis not present

## 2017-08-10 DIAGNOSIS — Z7982 Long term (current) use of aspirin: Secondary | ICD-10-CM | POA: Insufficient documentation

## 2017-08-10 DIAGNOSIS — E669 Obesity, unspecified: Secondary | ICD-10-CM | POA: Diagnosis not present

## 2017-08-10 DIAGNOSIS — I25110 Atherosclerotic heart disease of native coronary artery with unstable angina pectoris: Secondary | ICD-10-CM | POA: Diagnosis not present

## 2017-08-10 DIAGNOSIS — I2510 Atherosclerotic heart disease of native coronary artery without angina pectoris: Secondary | ICD-10-CM | POA: Diagnosis present

## 2017-08-10 HISTORY — DX: Unspecified osteoarthritis, unspecified site: M19.90

## 2017-08-10 HISTORY — PX: CORONARY STENT INTERVENTION: CATH118234

## 2017-08-10 HISTORY — PX: LEFT HEART CATH AND CORS/GRAFTS ANGIOGRAPHY: CATH118250

## 2017-08-10 HISTORY — PX: CORONARY ANGIOPLASTY WITH STENT PLACEMENT: SHX49

## 2017-08-10 LAB — GLUCOSE, CAPILLARY
Glucose-Capillary: 150 mg/dL — ABNORMAL HIGH (ref 65–99)
Glucose-Capillary: 190 mg/dL — ABNORMAL HIGH (ref 65–99)
Glucose-Capillary: 201 mg/dL — ABNORMAL HIGH (ref 65–99)

## 2017-08-10 LAB — POCT ACTIVATED CLOTTING TIME: Activated Clotting Time: 334 s

## 2017-08-10 SURGERY — LEFT HEART CATH AND CORS/GRAFTS ANGIOGRAPHY
Anesthesia: LOCAL

## 2017-08-10 MED ORDER — MIDAZOLAM HCL 2 MG/2ML IJ SOLN
INTRAMUSCULAR | Status: DC | PRN
  Administered 2017-08-10 (×2): 1 mg via INTRAVENOUS

## 2017-08-10 MED ORDER — INSULIN GLARGINE 100 UNIT/ML ~~LOC~~ SOLN
50.0000 [IU] | Freq: Every day | SUBCUTANEOUS | Status: DC
  Administered 2017-08-11: 50 [IU] via SUBCUTANEOUS
  Filled 2017-08-10: qty 0.5

## 2017-08-10 MED ORDER — ATORVASTATIN CALCIUM 40 MG PO TABS
40.0000 mg | ORAL_TABLET | Freq: Every day | ORAL | Status: DC
  Administered 2017-08-10: 19:00:00 40 mg via ORAL
  Filled 2017-08-10: qty 1

## 2017-08-10 MED ORDER — MIDAZOLAM HCL 2 MG/2ML IJ SOLN
INTRAMUSCULAR | Status: AC
  Filled 2017-08-10: qty 2

## 2017-08-10 MED ORDER — CARVEDILOL 3.125 MG PO TABS
6.2500 mg | ORAL_TABLET | Freq: Two times a day (BID) | ORAL | Status: DC
  Administered 2017-08-10 – 2017-08-11 (×2): 6.25 mg via ORAL
  Filled 2017-08-10 (×2): qty 2

## 2017-08-10 MED ORDER — LIDOCAINE HCL 1 % IJ SOLN
INTRAMUSCULAR | Status: AC
  Filled 2017-08-10: qty 20

## 2017-08-10 MED ORDER — NITROGLYCERIN 0.4 MG SL SUBL: 0.4000 mg | SUBLINGUAL_TABLET | SUBLINGUAL | Status: DC | PRN

## 2017-08-10 MED ORDER — CLOPIDOGREL BISULFATE 75 MG PO TABS
75.0000 mg | ORAL_TABLET | Freq: Once | ORAL | Status: AC
  Administered 2017-08-10: 75 mg via ORAL

## 2017-08-10 MED ORDER — FENTANYL CITRATE (PF) 100 MCG/2ML IJ SOLN
INTRAMUSCULAR | Status: DC | PRN
  Administered 2017-08-10 (×2): 25 ug via INTRAVENOUS

## 2017-08-10 MED ORDER — LOSARTAN POTASSIUM-HCTZ 100-12.5 MG PO TABS: 1.0000 | ORAL_TABLET | Freq: Every day | ORAL | Status: DC

## 2017-08-10 MED ORDER — FERROUS SULFATE 325 (65 FE) MG PO TABS
325.0000 mg | ORAL_TABLET | Freq: Every day | ORAL | Status: DC
  Administered 2017-08-10 – 2017-08-11 (×2): 325 mg via ORAL
  Filled 2017-08-10 (×2): qty 1

## 2017-08-10 MED ORDER — INSULIN ASPART 100 UNIT/ML ~~LOC~~ SOLN
0.0000 [IU] | Freq: Three times a day (TID) | SUBCUTANEOUS | Status: DC
  Administered 2017-08-10 – 2017-08-11 (×2): 3 [IU] via SUBCUTANEOUS

## 2017-08-10 MED ORDER — BIVALIRUDIN BOLUS VIA INFUSION - CUPID
INTRAVENOUS | Status: DC | PRN
  Administered 2017-08-10: 71.1 mg via INTRAVENOUS

## 2017-08-10 MED ORDER — SODIUM CHLORIDE 0.9% FLUSH: 3.0000 mL | Freq: Two times a day (BID) | INTRAVENOUS | Status: DC

## 2017-08-10 MED ORDER — IOPAMIDOL (ISOVUE-370) INJECTION 76%
INTRAVENOUS | Status: DC | PRN
  Administered 2017-08-10: 140 mL via INTRA_ARTERIAL

## 2017-08-10 MED ORDER — ASPIRIN 81 MG PO CHEW
81.0000 mg | CHEWABLE_TABLET | Freq: Every day | ORAL | Status: DC
  Administered 2017-08-11: 10:00:00 81 mg via ORAL
  Filled 2017-08-10 (×2): qty 1

## 2017-08-10 MED ORDER — LABETALOL HCL 5 MG/ML IV SOLN: 10.0000 mg | INTRAVENOUS | Status: AC | PRN

## 2017-08-10 MED ORDER — ACETAMINOPHEN 325 MG PO TABS: 650.0000 mg | ORAL_TABLET | ORAL | Status: DC | PRN

## 2017-08-10 MED ORDER — SODIUM CHLORIDE 0.9 % WEIGHT BASED INFUSION: 1.0000 mL/kg/h | INTRAVENOUS | Status: AC

## 2017-08-10 MED ORDER — HEPARIN (PORCINE) IN NACL 2-0.9 UNIT/ML-% IJ SOLN
INTRAMUSCULAR | Status: AC
  Filled 2017-08-10: qty 1000

## 2017-08-10 MED ORDER — HYDRALAZINE HCL 20 MG/ML IJ SOLN: 5.0000 mg | INTRAMUSCULAR | Status: AC | PRN

## 2017-08-10 MED ORDER — IOPAMIDOL (ISOVUE-370) INJECTION 76%
INTRAVENOUS | Status: AC
  Filled 2017-08-10: qty 100

## 2017-08-10 MED ORDER — SODIUM CHLORIDE 0.9 % IV SOLN: 250.0000 mL | INTRAVENOUS | Status: DC | PRN

## 2017-08-10 MED ORDER — IOPAMIDOL (ISOVUE-370) INJECTION 76%
INTRAVENOUS | Status: AC
  Filled 2017-08-10: qty 50

## 2017-08-10 MED ORDER — GABAPENTIN 300 MG PO CAPS
300.0000 mg | ORAL_CAPSULE | Freq: Two times a day (BID) | ORAL | Status: DC
  Administered 2017-08-10 – 2017-08-11 (×2): 300 mg via ORAL
  Filled 2017-08-10 (×2): qty 1

## 2017-08-10 MED ORDER — FENTANYL CITRATE (PF) 100 MCG/2ML IJ SOLN
INTRAMUSCULAR | Status: AC
  Filled 2017-08-10: qty 2

## 2017-08-10 MED ORDER — APIXABAN 5 MG PO TABS
5.0000 mg | ORAL_TABLET | Freq: Two times a day (BID) | ORAL | Status: DC
  Administered 2017-08-11: 5 mg via ORAL
  Filled 2017-08-10: qty 1

## 2017-08-10 MED ORDER — CLOPIDOGREL BISULFATE 75 MG PO TABS
ORAL_TABLET | ORAL | Status: AC
  Administered 2017-08-10: 75 mg via ORAL
  Filled 2017-08-10: qty 1

## 2017-08-10 MED ORDER — BIVALIRUDIN TRIFLUOROACETATE 250 MG IV SOLR
INTRAVENOUS | Status: AC
  Filled 2017-08-10: qty 250

## 2017-08-10 MED ORDER — LINAGLIPTIN 5 MG PO TABS
5.0000 mg | ORAL_TABLET | Freq: Every day | ORAL | Status: DC
  Administered 2017-08-10 – 2017-08-11 (×2): 5 mg via ORAL
  Filled 2017-08-10 (×2): qty 1

## 2017-08-10 MED ORDER — ASPIRIN 81 MG PO CHEW: 81.0000 mg | CHEWABLE_TABLET | ORAL | Status: DC

## 2017-08-10 MED ORDER — LIDOCAINE HCL (PF) 1 % IJ SOLN
INTRAMUSCULAR | Status: DC | PRN
  Administered 2017-08-10: 18 mL

## 2017-08-10 MED ORDER — NITROGLYCERIN 1 MG/10 ML FOR IR/CATH LAB
INTRA_ARTERIAL | Status: DC | PRN
  Administered 2017-08-10 (×2): 200 ug via INTRACORONARY

## 2017-08-10 MED ORDER — SODIUM CHLORIDE 0.9% FLUSH: 3.0000 mL | INTRAVENOUS | Status: DC | PRN

## 2017-08-10 MED ORDER — SODIUM CHLORIDE 0.9 % IV SOLN
INTRAVENOUS | Status: DC
  Administered 2017-08-10: 10:00:00 via INTRAVENOUS

## 2017-08-10 MED ORDER — HYDROCHLOROTHIAZIDE 12.5 MG PO CAPS
12.5000 mg | ORAL_CAPSULE | Freq: Every day | ORAL | Status: DC
  Administered 2017-08-10 – 2017-08-11 (×2): 12.5 mg via ORAL
  Filled 2017-08-10 (×2): qty 1

## 2017-08-10 MED ORDER — HEPARIN (PORCINE) IN NACL 2-0.9 UNIT/ML-% IJ SOLN
INTRAMUSCULAR | Status: AC | PRN
  Administered 2017-08-10 (×2): 500 mL

## 2017-08-10 MED ORDER — ANGIOPLASTY BOOK
Freq: Once | Status: AC
  Administered 2017-08-10: 1
  Filled 2017-08-10: qty 1

## 2017-08-10 MED ORDER — SODIUM CHLORIDE 0.9 % IV SOLN
INTRAVENOUS | Status: DC | PRN
  Administered 2017-08-10: 1.75 mg/kg/h via INTRAVENOUS

## 2017-08-10 MED ORDER — ENSURE ENLIVE PO LIQD
237.0000 mL | Freq: Two times a day (BID) | ORAL | Status: DC
  Administered 2017-08-11: 237 mL via ORAL
  Filled 2017-08-10 (×4): qty 237

## 2017-08-10 MED ORDER — FUROSEMIDE 40 MG PO TABS
40.0000 mg | ORAL_TABLET | Freq: Every day | ORAL | Status: DC
  Administered 2017-08-10 – 2017-08-11 (×2): 40 mg via ORAL
  Filled 2017-08-10 (×2): qty 1

## 2017-08-10 MED ORDER — ONDANSETRON HCL 4 MG/2ML IJ SOLN: 4.0000 mg | Freq: Four times a day (QID) | INTRAMUSCULAR | Status: DC | PRN

## 2017-08-10 MED ORDER — LOSARTAN POTASSIUM 50 MG PO TABS
100.0000 mg | ORAL_TABLET | Freq: Every day | ORAL | Status: DC
  Administered 2017-08-10 – 2017-08-11 (×2): 100 mg via ORAL
  Filled 2017-08-10 (×2): qty 2

## 2017-08-10 MED ORDER — SODIUM CHLORIDE 0.9% FLUSH
3.0000 mL | Freq: Two times a day (BID) | INTRAVENOUS | Status: DC
  Administered 2017-08-10: 3 mL via INTRAVENOUS

## 2017-08-10 MED ORDER — CLOPIDOGREL BISULFATE 75 MG PO TABS
75.0000 mg | ORAL_TABLET | Freq: Every day | ORAL | Status: DC
  Administered 2017-08-11: 75 mg via ORAL
  Filled 2017-08-10: qty 1

## 2017-08-10 SURGICAL SUPPLY — 23 items
BALLN SAPPHIRE 2.5X12 (BALLOONS) ×2
BALLN SAPPHIRE ~~LOC~~ 3.25X15 (BALLOONS) ×1 IMPLANT
BALLN SAPPHIRE ~~LOC~~ 4.0X12 (BALLOONS) ×1 IMPLANT
BALLOON SAPPHIRE 2.5X12 (BALLOONS) IMPLANT
CATH INFINITI 5 FR IM (CATHETERS) ×1 IMPLANT
CATH INFINITI 5FR MULTPACK ANG (CATHETERS) ×1 IMPLANT
CATH LAUNCHER 6FR EBU3.5 (CATHETERS) ×1 IMPLANT
DEVICE CLOSURE PERCLS PRGLD 6F (VASCULAR PRODUCTS) IMPLANT
KIT ENCORE 26 ADVANTAGE (KITS) ×1 IMPLANT
KIT HEART LEFT (KITS) ×2 IMPLANT
PACK CARDIAC CATHETERIZATION (CUSTOM PROCEDURE TRAY) ×2 IMPLANT
PERCLOSE PROGLIDE 6F (VASCULAR PRODUCTS) ×2
SHEATH PINNACLE 5F 10CM (SHEATH) ×1 IMPLANT
SHEATH PINNACLE 6F 10CM (SHEATH) ×1 IMPLANT
STENT SYNERGY DES 3.5X16 (Permanent Stent) ×1 IMPLANT
STENT SYNERGY DES 3X24 (Permanent Stent) ×1 IMPLANT
SYR MEDRAD MARK V 150ML (SYRINGE) ×2 IMPLANT
TRANSDUCER W/STOPCOCK (MISCELLANEOUS) ×2 IMPLANT
TUBING CIL FLEX 10 FLL-RA (TUBING) ×2 IMPLANT
WIRE COUGAR XT STRL 190CM (WIRE) ×1 IMPLANT
WIRE EMERALD 3MM-J .025X260CM (WIRE) IMPLANT
WIRE EMERALD 3MM-J .035X260CM (WIRE) ×1 IMPLANT
WIRE HI TORQ VERSACORE-J 145CM (WIRE) ×1 IMPLANT

## 2017-08-10 NOTE — Interval H&P Note (Signed)
Cath Lab Visit (complete for each Cath Lab visit)  Clinical Evaluation Leading to the Procedure:   ACS: No.  Non-ACS:    Anginal Classification: CCS III  Anti-ischemic medical therapy: Minimal Therapy (1 class of medications)  Non-Invasive Test Results: No non-invasive testing performed  Prior CABG: Previous CABG      History and Physical Interval Note:  08/10/2017 1:36 PM  [name].  has presented today for surgery, with the diagnosis of [place]  The various methods of treatment have been discussed with the patient and family. After consideration of risks, benefits and other options for treatment, the patient has consented to  Procedure(s): LEFT HEART CATH AND CORS/GRAFTS ANGIOGRAPHY (N/A) as a surgical intervention .  The patient's history has been reviewed, patient examined, no change in status, stable for surgery.  I have reviewed the patient's chart and labs.  Questions were answered to the patient's satisfaction.     Sherren Mocha

## 2017-08-11 ENCOUNTER — Encounter (HOSPITAL_COMMUNITY): Payer: Self-pay | Admitting: Cardiovascular Disease

## 2017-08-11 DIAGNOSIS — I509 Heart failure, unspecified: Secondary | ICD-10-CM | POA: Diagnosis not present

## 2017-08-11 DIAGNOSIS — I480 Paroxysmal atrial fibrillation: Secondary | ICD-10-CM | POA: Diagnosis not present

## 2017-08-11 DIAGNOSIS — I110 Hypertensive heart disease with heart failure: Secondary | ICD-10-CM | POA: Diagnosis not present

## 2017-08-11 DIAGNOSIS — I2582 Chronic total occlusion of coronary artery: Secondary | ICD-10-CM | POA: Diagnosis not present

## 2017-08-11 DIAGNOSIS — I25110 Atherosclerotic heart disease of native coronary artery with unstable angina pectoris: Secondary | ICD-10-CM | POA: Diagnosis not present

## 2017-08-11 DIAGNOSIS — I25709 Atherosclerosis of coronary artery bypass graft(s), unspecified, with unspecified angina pectoris: Secondary | ICD-10-CM

## 2017-08-11 DIAGNOSIS — Z7982 Long term (current) use of aspirin: Secondary | ICD-10-CM | POA: Diagnosis not present

## 2017-08-11 DIAGNOSIS — E1151 Type 2 diabetes mellitus with diabetic peripheral angiopathy without gangrene: Secondary | ICD-10-CM | POA: Diagnosis not present

## 2017-08-11 DIAGNOSIS — Z7901 Long term (current) use of anticoagulants: Secondary | ICD-10-CM | POA: Diagnosis not present

## 2017-08-11 DIAGNOSIS — Z7902 Long term (current) use of antithrombotics/antiplatelets: Secondary | ICD-10-CM | POA: Diagnosis not present

## 2017-08-11 DIAGNOSIS — Z79899 Other long term (current) drug therapy: Secondary | ICD-10-CM | POA: Diagnosis not present

## 2017-08-11 LAB — CBC
HEMATOCRIT: 31.2 % — AB (ref 39.0–52.0)
HEMOGLOBIN: 9.3 g/dL — AB (ref 13.0–17.0)
MCH: 22.4 pg — ABNORMAL LOW (ref 26.0–34.0)
MCHC: 29.8 g/dL — ABNORMAL LOW (ref 30.0–36.0)
MCV: 75.2 fL — AB (ref 78.0–100.0)
Platelets: 340 10*3/uL (ref 150–400)
RBC: 4.15 MIL/uL — AB (ref 4.22–5.81)
RDW: 17.2 % — ABNORMAL HIGH (ref 11.5–15.5)
WBC: 9.1 10*3/uL (ref 4.0–10.5)

## 2017-08-11 LAB — BASIC METABOLIC PANEL
Anion gap: 11 (ref 5–15)
BUN: 15 mg/dL (ref 6–20)
CHLORIDE: 103 mmol/L (ref 101–111)
CO2: 23 mmol/L (ref 22–32)
Calcium: 8.8 mg/dL — ABNORMAL LOW (ref 8.9–10.3)
Creatinine, Ser: 1.13 mg/dL (ref 0.61–1.24)
GFR calc non Af Amer: >60 mL/min (ref >60)
Glucose, Bld: 151 mg/dL — ABNORMAL HIGH (ref 65–99)
POTASSIUM: 3.5 mmol/L (ref 3.5–5.1)
SODIUM: 137 mmol/L (ref 135–145)

## 2017-08-11 LAB — GLUCOSE, CAPILLARY: Glucose-Capillary: 151 mg/dL — ABNORMAL HIGH (ref 65–99)

## 2017-08-11 MED ORDER — ASPIRIN 81 MG PO CHEW: 81.0000 mg | CHEWABLE_TABLET | Freq: Every day | ORAL | 0 refills | Status: DC

## 2017-08-11 MED ORDER — CLOPIDOGREL BISULFATE 75 MG PO TABS: 75.0000 mg | ORAL_TABLET | Freq: Every day | ORAL | 5 refills | Status: DC

## 2017-08-11 MED ORDER — ENSURE ENLIVE PO LIQD: 237.0000 mL | Freq: Two times a day (BID) | ORAL | 12 refills | Status: DC

## 2017-08-11 MED FILL — Heparin Sodium (Porcine) 2 Unit/ML in Sodium Chloride 0.9%: INTRAMUSCULAR | Qty: 1000 | Status: AC

## 2017-08-11 MED FILL — Lidocaine HCl Local Inj 1%: INTRAMUSCULAR | Qty: 20 | Status: AC

## 2017-08-11 NOTE — Progress Notes (Signed)
CARDIAC REHAB PHASE I   PRE:  Rate/Rhythm: 38 SR with PVC    BP: sitting 141/67    SaO2:   MODE:  Ambulation: 1000 ft   POST:  Rate/Rhythm: 92 SR    BP: sitting 163/63     SaO2:   Pt tolerated long distance well, no c/o. He does not like to complain. Discussed Plavix, stent, restrictions, diet, ex gl, NTG and CRPII. Pt likes to do what he wants, we encouraged small changes, esp tighter DM control (drinks soft drinks sometimes). Will refer to CRPII [place] although pt has never wanted to do program in the past.  [place], ACSM 08/11/2017 9:41 AM

## 2017-08-11 NOTE — Progress Notes (Signed)
Progress Note  Patient Name: Justin Blackburn. Date of Encounter: 08/11/2017  Primary Cardiologist: Daneen Schick  Subjective   Feels better.  Has not had angina since the procedure.  Has not walked in the hall yet.  No access site complications.  Ready to go home.  Inpatient Medications    Scheduled Meds: . apixaban  5 mg Oral BID  . aspirin  81 mg Oral Daily  . atorvastatin  40 mg Oral q1800  . carvedilol  6.25 mg Oral BID  . clopidogrel  75 mg Oral Daily  . feeding supplement (ENSURE ENLIVE)  237 mL Oral BID BM  . ferrous sulfate  325 mg Oral Daily  . furosemide  40 mg Oral Daily  . gabapentin  300 mg Oral BID  . losartan  100 mg Oral Daily   And  . hydrochlorothiazide  12.5 mg Oral Daily  . insulin aspart  0-15 Units Subcutaneous TID WC  . insulin glargine  50 Units Subcutaneous Daily  . linagliptin  5 mg Oral Daily  . sodium chloride flush  3 mL Intravenous Q12H   Continuous Infusions: . sodium chloride     PRN Meds: sodium chloride, acetaminophen, nitroGLYCERIN, ondansetron (ZOFRAN) IV, sodium chloride flush   Vital Signs    Vitals:   08/10/17 1954 08/10/17 2000 08/11/17 0419 08/11/17 0721  BP: (!) 150/78  (!) 124/58 (!) 150/68  Pulse: 68  61 (!) 58  Resp: 16 (!) 23 14 (!) 22  Temp: 98.4 F (36.9 C)  98 F (36.7 C) 98.3 F (36.8 C)  TempSrc: Oral  Oral Oral  SpO2: 98%  96% 97%  Weight:   210 lb 12.2 oz (95.6 kg)   Height:        Intake/Output Summary (Last 24 hours) at 08/11/2017 4128 Last data filed at 08/11/2017 0814 Gross per 24 hour  Intake 2220.6 ml  Output 1800 ml  Net 420.6 ml   Filed Weights   08/10/17 0919 08/11/17 0419  Weight: 209 lb (94.8 kg) 210 lb 12.2 oz (95.6 kg)    Telemetry    Normal sinus rhythm- Personally Reviewed  ECG    Left anterior hemiblock, widespread precordial Q waves, and no change when compared to prior tracings.- Personally Reviewed  Physical Exam  Comfortable.  No acute distress. GEN: No acute distress.    Neck: No JVD Cardiac: RRR, no murmurs, rubs, or gallops.  Respiratory: Clear to auscultation bilaterally. GI: Soft, nontender, non-distended  MS: No edema; No deformity.  Right femoral access site is unremarkable.  No evidence of bleeding or hematoma. Neuro:  Nonfocal  Psych: Normal affect   Labs    Chemistry Recent Labs  Lab 08/05/17 1256 08/11/17 0406  NA 139 137  K 4.3 3.5  CL 103 103  CO2 21 23  GLUCOSE 192* 151*  BUN 17 15  CREATININE 1.44* 1.13  CALCIUM 9.7 8.8*  GFRNONAA 49* >60  GFRAA 57* >60  ANIONGAP  --  11     Hematology Recent Labs  Lab 08/05/17 1256 08/11/17 0406  WBC 9.5 9.1  RBC 4.58 4.15*  HGB 10.1* 9.3*  HCT 33.5* 31.2*  MCV 73* 75.2*  MCH 22.1* 22.4*  MCHC 30.1* 29.8*  RDW 17.0* 17.2*  PLT 410* 340    Cardiac EnzymesNo results for input(s): TROPONINI in the last 168 hours. No results for input(s): TROPIPOC in the last 168 hours.   BNPNo results for input(s): BNP, PROBNP in the last 168 hours.  DDimer No results for input(s): DDIMER in the last 168 hours.   Radiology    No results found.  Cardiac Studies   Cardiac cath/PCI 08/10/2017: Coronary Diagrams   Diagnostic Diagram       Post-Intervention Diagram          Patient Profile     67 y.o. male diabetes mellitus type 2, hypertension, prior heavy smoking history, PAF on chronic apixaban therapy, strong family history of CAD, personal history of CAD with coronary bypass grafting 1998 and 2007, drug-eluting stent OM1 2013, and who presents now with class III angina progressive over the past 2 months.  Assessment & Plan    1. Coronary atherosclerotic heart disease with coronary bypass grafting 1998 and 2007.  Presents now with class III angina and a diagnostic catheterization was found to have 70% protected left main and 70% first obtuse marginal stenosis.  Both sites were successfully stented with beautiful angiographic result.  Postprocedure no complications.  We will plan  discharge today. 2. History of atrial flutter/fibrillation and requiring apixaban therapy chronically.  He would be on 3 drug therapy including aspirin and Plavix.  Aspirin should be dropped after 1 month.  Apixaban and Plavix will be continued with discontinuation of Plavix at 6 months and resumption of aspirin.  Needs clinical follow-up 1-2 weeks.  No heavy activity for 48 hours to avoid groin complication.  Medications otherwise will remain unchanged.  For questions or updates, please contact [place] Please consult [email or web address] for contact info under Cardiology/STEMI.      Signed, [name], MD  08/11/2017, 8:22 AM

## 2017-08-11 NOTE — Discharge Summary (Addendum)
The patient has been seen in conjunction with Daune Perch, NP. All aspects of care have been considered and discussed. The patient has been personally interviewed, examined, and all clinical data has been reviewed.   Please see my note from earlier today.  Triple drug therapy for 1 month then drop aspirin.  Clopidogrel therapy for total of 6 months then DC and resume aspirin daily along with apixaban.  Clinical follow-up in 1-2 weeks.  Discharge Summary    Patient ID: Justin Lomas.,  MRN: [id number], DOB/AGE: [birth year] 67 y.o.  Admit date: 08/10/2017 Discharge date: 08/11/2017  Primary Care Provider: Practice, [facility] Primary Cardiologist: [name], MD  Discharge Diagnoses    Active Problems:   Coronary artery disease involving coronary bypass graft of native heart with angina pectoris [facility])   Coronary artery disease with exertional angina [facility])   Allergies No Known Allergies  Diagnostic Studies/Procedures    LEFT HEART CATH AND CORS/GRAFTS ANGIOGRAPHY     08/10/2017  Conclusion    Ost LAD lesion is 100% stenosed.  Prox RCA lesion is 100% stenosed.  Ost LM to Dist LM lesion is 75% stenosed.  Prox Cx to Mid Cx lesion is 25% stenosed.  Prox Cx lesion is 70% stenosed.  LIMA graft was visualized by angiography and is normal in caliber.  The graft exhibits no disease.  RIMA graft was visualized by non-selective angiography.  A drug-eluting stent was successfully placed using a STENT SYNERGY DES 3X24.  Post intervention, there is a 0% residual stenosis.  Post intervention, there is a 0% residual stenosis.  A drug-eluting stent was successfully placed.  Post intervention, there is a 0% residual stenosis.   1. Severe native 3 vessel CAD with total occlusion of the RCA and LAD, severe stenosis of the native left main and native circumflex 2. Successful PCI of the left main and LCx using DES platforms  Recommend: ASA 81 mg x 1  month, plavix 75 mg daily, and resume eliquis tomorrow    Diagnostic Diagram       Post-Intervention Diagram        _____________   History of Present Illness     Justin Blackburn is a 67 y.o. male who presented to the office on 08/05/17 for the evaluation of exertional CP c/w unstable angina. He is followed by Dr. Tamala Julian and has significant history of CAD. He underwent CABG in 1998 and had redo bypass again in 2007. His last LHC in 2013 showed advanced coronary atherosclerotic disease with total occlusion of the LAD and native right coronary. Total occlusion of the circumflex after the large first obtuse marginal. The obtuse marginal #1 contained an eccentric 80-90% stenosis. Widely patent right internal mammary and left internal mammary graft to the right coronary and LAD respectively.  He underwent successful stenting of the obtuse marginal to 0% with TIMI grade 3 flow. Ff at that time was 40%. Additional medical problems include HTN, DM, HLD, PAF and chronic anticoagulation therapy with Eliquis.   He presents to clinic with complaints of exertional bilateral shoulder and substernal chest pain. Similar to previous angina. Symptoms have been present for 1-2 months and gradually getting worse. He develops symptoms with walking at a moderate pace that improves with rest. He has not required SL NTG and denies any resting symptoms. However, his episodes are increasing in frequency. His wife notes that he often has to stop and rest if he tries to do anything  light around the house. He is currently CP free. He reports full med compliance. BP is 120/68. Pulse rate in the 70s. EKG shows NSR.   Cardiac cath was arranged and pt was instructed to hold his Eliquis for 48 hours prior to procedure.   Hospital Course     Consultants: None  Mr. Azizi underwent cardiac catheterization on 08/10/17. See report above.    1. Coronary atherosclerotic heart disease with coronary bypass grafting 1998 and 2007.   Presents now with class III angina and a diagnostic catheterization was found to have 70% protected left main and 70% first obtuse marginal stenosis.  Both sites were successfully stented with beautiful angiographic result.  Postprocedure no complications.  We will plan discharge today. 2. History of atrial flutter/fibrillation and requiring apixaban therapy chronically.  He would be on 3 drug therapy including aspirin and Plavix.  Aspirin should be dropped after 1 month.  Apixaban and Plavix will be continued with discontinuation of Plavix at 6 months and resumption of aspirin.  Will be seen for clinical follow-up 1-2 weeks.  No heavy activity for 48 hours to avoid groin complication.  Medications otherwise will remain unchanged.  Patient has walked with cardiac rehab and is doing well.   Day of discharge: SCr 1.13, Hgb 9.3  Patient has been seen by Dr. Tamala Julian today and deemed ready for discharge home. All follow up appointments have been scheduled. Discharge medications are listed below. _____________  Discharge Vitals Blood pressure (!) 150/68, pulse (!) 58, temperature 98.3 F (36.8 C), temperature source Oral, resp. rate (!) 22, height 5\' 11"  (1.803 m), weight 210 lb 12.2 oz (95.6 kg), SpO2 97 %.  Filed Weights   08/10/17 0919 08/11/17 0419  Weight: 209 lb (94.8 kg) 210 lb 12.2 oz (95.6 kg)    Labs & Radiologic Studies    CBC Recent Labs    08/11/17 0406  WBC 9.1  HGB 9.3*  HCT 31.2*  MCV 75.2*  PLT 845   Basic Metabolic Panel Recent Labs    08/11/17 0406  NA 137  K 3.5  CL 103  CO2 23  GLUCOSE 151*  BUN 15  CREATININE 1.13  CALCIUM 8.8*   Liver Function Tests No results for input(s): AST, ALT, ALKPHOS, BILITOT, PROT, ALBUMIN in the last 72 hours. No results for input(s): LIPASE, AMYLASE in the last 72 hours. Cardiac Enzymes No results for input(s): CKTOTAL, CKMB, CKMBINDEX, TROPONINI in the last 72 hours. BNP Invalid input(s): POCBNP D-Dimer No results for  input(s): DDIMER in the last 72 hours. Hemoglobin A1C No results for input(s): HGBA1C in the last 72 hours. Fasting Lipid Panel No results for input(s): CHOL, HDL, LDLCALC, TRIG, CHOLHDL, LDLDIRECT in the last 72 hours. Thyroid Function Tests No results for input(s): TSH, T4TOTAL, T3FREE, THYROIDAB in the last 72 hours.  Invalid input(s): FREET3 _____________  No results found. Disposition   Pt is being discharged home today in good condition.  Follow-up Plans & Appointments    Follow-up Information    Consuelo Pandy, PA-C. Call.   Specialties:  Cardiology, Radiology Why:  Cardiology hospital follow up on March 12th at 10:30. Please arrive 15 minutes early for check in.  Contact information: [place] [address] [phone]          Discharge Instructions    Diet - low sodium heart healthy   Complete by:  As directed    Discharge instructions   Complete by:  As directed  Do not resume metformin until 48 hours after cath. 2/28 evening dose.   Increase activity slowly   Complete by:  As directed       Discharge Medications   Allergies as of 08/11/2017   No Known Allergies     Medication List    STOP taking these medications   aspirin EC 325 MG tablet Replaced by:  aspirin 81 MG chewable tablet     TAKE these medications   aspirin 81 MG chewable tablet Chew 1 tablet (81 mg total) by mouth daily. Take for 30 days only then stop. Replaces:  aspirin EC 325 MG tablet   atorvastatin 40 MG tablet Commonly known as:  LIPITOR Take 40 mg by mouth daily at 6 PM.   carvedilol 6.25 MG tablet Commonly known as:  COREG Take 6.25 mg by mouth 2 (two) times daily.   clopidogrel 75 MG tablet Commonly known as:  PLAVIX Take 1 tablet (75 mg total) by mouth daily. Take for 6 months. Plan to stop on August 27th. Check with cardiologist at that time. What changed:  additional instructions   ELIQUIS 5 MG Tabs tablet Generic drug:   apixaban TAKE 1 TABLET BY MOUTH TWICE A DAY What changed:    how much to take  how to take this  when to take this   feeding supplement (ENSURE ENLIVE) Liqd Take 237 mLs by mouth 2 (two) times daily between meals.   ferrous sulfate 325 (65 FE) MG tablet Take 325 mg by mouth daily.   furosemide 40 MG tablet Commonly known as:  LASIX Take 1 tablet (40 mg total) by mouth daily.   gabapentin 300 MG capsule Commonly known as:  NEURONTIN Take 300 mg by mouth 2 (two) times daily.   LANTUS SOLOSTAR 100 UNIT/ML Solostar Pen Generic drug:  Insulin Glargine Inject 50 Units into the skin daily.   losartan-hydrochlorothiazide 100-12.5 MG tablet Commonly known as:  HYZAAR TAKE 1 (ONE) TABLET BY MOUTH DAILY FOR HIGH BP   metFORMIN 1000 MG tablet Commonly known as:  GLUCOPHAGE Take 1,000 mg by mouth 2 (two) times daily.   nitroGLYCERIN 0.4 MG SL tablet Commonly known as:  NITROSTAT Place 1 tablet (0.4 mg total) under the tongue every 5 (five) minutes as needed for chest pain (Angina).   sitaGLIPtin 100 MG tablet Commonly known as:  JANUVIA Take 100 mg by mouth daily.          Outstanding Labs/Studies   CBC at follow up.   Duration of Discharge Encounter   Greater than 30 minutes including physician time.  Signed, Daune Perch NP 08/11/2017, 9:16 AM

## 2017-08-11 NOTE — Discharge Instructions (Signed)
You will be going home on aspirin 81 mg daily and apixaban 5 mg twice daily and Plavix 75 mg daily. The aspirin is to be taken for only 30 days then stopped. Plavix will be discontinued after 6 months. You will continue to take apixaban.   No heavy activity for 48 hours to avoid groin complications.   PLEASE REMEMBER TO BRING ALL OF YOUR MEDICATIONS TO EACH OF YOUR FOLLOW-UP OFFICE VISITS.  PLEASE ATTEND ALL SCHEDULED FOLLOW-UP APPOINTMENTS.   Activity: Increase activity slowly as tolerated. You may shower, but no soaking baths (or swimming) for 1 week. No driving for 24 hours. No lifting over 5 lbs for 1 week. No sexual activity for 1 week.   You May Return to Work: in 1 week (if applicable)  Wound Care: You may wash cath site gently with soap and water. Keep cath site clean and dry. If you notice pain, swelling, bleeding or pus at your cath site, please call [phone].   =====================================================================================================  Information on my medicine - ELIQUIS (apixaban)  Why was Eliquis prescribed for you? Eliquis was prescribed for you to reduce the risk of a blood clot forming that can cause a stroke if you have a medical condition called atrial fibrillation (a type of irregular heartbeat).  What do You need to know about Eliquis ? Take your Eliquis TWICE DAILY - one tablet in the morning and one tablet in the evening with or without food. If you have difficulty swallowing the tablet whole please discuss with your pharmacist how to take the medication safely.  Take Eliquis exactly as prescribed by your doctor and DO NOT stop taking Eliquis without talking to the doctor who prescribed the medication.  Stopping may increase your risk of developing a stroke.  Refill your prescription before you run out.  After discharge, you should have regular check-up appointments with your healthcare provider that is prescribing your Eliquis.   In the future your dose may need to be changed if your kidney function or weight changes by a significant amount or as you get older.  What do you do if you miss a dose? If you miss a dose, take it as soon as you remember on the same day and resume taking twice daily.  Do not take more than one dose of ELIQUIS at the same time to make up a missed dose.  Important Safety Information A possible side effect of Eliquis is bleeding. You should call your healthcare provider right away if you experience any of the following: ? Bleeding from an injury or your nose that does not stop. ? Unusual colored urine (red or dark brown) or unusual colored stools (red or black). ? Unusual bruising for unknown reasons. ? A serious fall or if you hit your head (even if there is no bleeding).  Some medicines may interact with Eliquis and might increase your risk of bleeding or clotting while on Eliquis. To help avoid this, consult your healthcare provider or pharmacist prior to using any new prescription or non-prescription medications, including herbals, vitamins, non-steroidal anti-inflammatory drugs (NSAIDs) and supplements.  This website has more information on Eliquis (apixaban): http://www.eliquis.com/eliquis/home

## 2017-08-18 ENCOUNTER — Telehealth (HOSPITAL_COMMUNITY): Payer: Self-pay

## 2017-08-18 NOTE — Telephone Encounter (Signed)
Patients insurance is active and benefits verified through [place] [facility] - $45.00 co-pay, no deductible, out of pocket amount of $4,200/$524.59 has been met, no co-insurance, and no pre-authorization is required. Passport/reference [phone]  Patient will be contacted and scheduled upon review by the RN Navigator.

## 2017-08-20 ENCOUNTER — Telehealth (HOSPITAL_COMMUNITY): Payer: Self-pay

## 2017-08-20 NOTE — Telephone Encounter (Signed)
Called to speak with patient in regards to Cardiac Rehab - Patient stated he is not interested in the program and hung up on me - Closed referral.

## 2017-08-23 ENCOUNTER — Ambulatory Visit: Payer: Medicare HMO | Admitting: Cardiology

## 2017-08-24 ENCOUNTER — Ambulatory Visit: Payer: Medicare HMO | Admitting: Cardiology

## 2017-08-24 ENCOUNTER — Encounter: Payer: Self-pay | Admitting: Cardiology

## 2017-08-24 VITALS — BP 130/78 | HR 62 | Ht 71.0 in | Wt 224.0 lb

## 2017-08-24 DIAGNOSIS — E785 Hyperlipidemia, unspecified: Secondary | ICD-10-CM

## 2017-08-24 NOTE — Patient Instructions (Addendum)
Medication Instructions:  Your physician has recommended you make the following change in your medication:   CONTINUE taking aspirin 81 mg once a day til 09/24/17  If you need a refill on your cardiac me/dications, please contact your pharmacy first.  Labwork: Your physician recommends that you return for lab work in: 6-8 weeks for liver and cholesterol check   Testing/Procedures: None ordered   Follow-Up: Your physician recommends that you schedule a follow-up appointment in: 2-3 months with Dr. Tamala Julian   Any Other Special Instructions Will Be Listed Below (If Applicable).   Thank you for choosing [facility], RN  [phone]  If you need a refill on your cardiac medications before your next appointment, please call your pharmacy.

## 2017-08-24 NOTE — Progress Notes (Signed)
08/24/2017 Justin Blackburn   11-12-1950  [id number]  Primary Physician Practice, [facility] Primary Cardiologist: Dr. Tamala Julian   Reason for Visit/CC: f/u for CAD Recent s/p PCI + DES  HPI:  Justin Blackburn is a 67 y.o. male who is being seen today for post hospital f/u after recent management of CAD w/ unstable angina. He is followed by Dr. Tamala Julian and has significant history of CAD. He underwent CABG in 1998 and had redo bypass again in 2007. His last LHC in 2013 showed advanced coronary atherosclerotic disease with total occlusion of the LAD and native right coronary. Total occlusion of the circumflex after the large first obtuse marginal. The obtuse marginal #1 contained an eccentric 80-90% stenosis. Widely patent right internal mammary and left internal mammary graft to the right coronary and LAD respectively.  He underwent successful stenting of the obtuse marginal to 0% with TIMI grade 3 flow. Ff at that time was 40%. Additional medical problems include HTN, DM, HLD, PAF and chronic anticoagulation therapy with Eliquis.   He presented to clinic on 08/05/17 with class III angina and was subsequently set up for [facility], performed by Dr. Burt Knack on 08/10/17. He was found to have severe native 3 vessel CAD with total occlusion of the RCA and LAD, severe stenosis of the native left main and native circumflex. He underwent successful PCI of the left main and LCx using DES platforms. He tolerated the procedure well. Triple therapy was recommended x 1 month (ASA, Plavix + Eliquis), followed by discontinuation of ASA therafter. He was also restarted on Lipitor 40 mg. Pt had discontinued it previously given mild arthralgias. Recent lipid panel 06/2017 showed elevated LDL at 134 mg/dL. TG were 243. Pt also continued on BB and ARB.  He presents back to clinic today for f/u. He has done well. He denies any recurrent CP. No dyspnea and no exertional symptoms. Femoral cath site is well healed w/o any  complications. BP is well controlled at 130/78. Pulse rate is 62. He states that he only took baby ASA x 1 week. He misunderstood his instructions (ordered to take x 1 month). He has been compliant with all other meds. He is back on Lipitor and seems to be tolerating well w/o side effects.   Cardiac Studies Procedures   CORONARY STENT INTERVENTION  LEFT HEART CATH AND CORS/GRAFTS ANGIOGRAPHY  Conclusion     Ost LAD lesion is 100% stenosed.  Prox RCA lesion is 100% stenosed.  Ost LM to Dist LM lesion is 75% stenosed.  Prox Cx to Mid Cx lesion is 25% stenosed.  Prox Cx lesion is 70% stenosed.  LIMA graft was visualized by angiography and is normal in caliber.  The graft exhibits no disease.  RIMA graft was visualized by non-selective angiography.  A drug-eluting stent was successfully placed using a STENT SYNERGY DES 3X24.  Post intervention, there is a 0% residual stenosis.  Post intervention, there is a 0% residual stenosis.  A drug-eluting stent was successfully placed.  Post intervention, there is a 0% residual stenosis.   1. Severe native 3 vessel CAD with total occlusion of the RCA and LAD, severe stenosis of the native left main and native circumflex 2. Successful PCI of the left main and LCx using DES platforms       Current Meds  Medication Sig  . aspirin EC 81 MG tablet Take 81 mg by mouth daily.  [name] atorvastatin (LIPITOR) 40 MG tablet Take 40 mg by  mouth daily at 6 PM.   . carvedilol (COREG) 6.25 MG tablet Take 6.25 mg by mouth 2 (two) times daily.  . clopidogrel (PLAVIX) 75 MG tablet Take 1 tablet (75 mg total) by mouth daily. Take for 6 months. Plan to stop on August 27th. Check with cardiologist at that time.  [name] ELIQUIS 5 MG TABS tablet TAKE 1 TABLET BY MOUTH TWICE A DAY  . feeding supplement, ENSURE ENLIVE, (ENSURE ENLIVE) LIQD Take 237 mLs by mouth 2 (two) times daily between meals.  . ferrous sulfate 325 (65 FE) MG tablet Take 325 mg by mouth daily.   . furosemide (LASIX) 40 MG tablet Take 1 tablet (40 mg total) by mouth daily.  [name] gabapentin (NEURONTIN) 300 MG capsule Take 300 mg by mouth 2 (two) times daily.  [name] LANTUS SOLOSTAR 100 UNIT/ML Solostar Pen Inject 50 Units into the skin daily.   [name] losartan-hydrochlorothiazide (HYZAAR) 100-12.5 MG tablet TAKE 1 (ONE) TABLET BY MOUTH DAILY FOR HIGH BP  . metFORMIN (GLUCOPHAGE) 1000 MG tablet Take 1,000 mg by mouth 2 (two) times daily.  . nitroGLYCERIN (NITROSTAT) 0.4 MG SL tablet Place 1 tablet (0.4 mg total) under the tongue every 5 (five) minutes as needed for chest pain (Angina).  . sitaGLIPtin (JANUVIA) 100 MG tablet Take 100 mg by mouth daily.   No Known Allergies Past Medical History:  Diagnosis Date  . Angina   . Arthritis    "hands" (08/10/2017)  . Atrial flutter, paroxysmal ([place])    arrived with aflutter with RVR, EF down to 25-30%, s/p TEE DCCV on 01/29/2015  . Chronic lower back pain    "all the time from my sciatic nerve"  . Erectile dysfunction   . Heart failure ([place])   . Hypercholesteremia   . Hypertension   . 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 Myocardial infarction ([place]) 1988; ~4854;  ~1996;~ 2000  . Obesity   . Peripheral vascular disease ([place])   . Umbilical hernia    unrepaired   Family History  Problem Relation Age of Onset  . Heart disease Mother   . Heart attack Mother   . Heart disease Father   . Heart attack Father   . Heart attack Brother   . Stroke Neg Hx    Past Surgical History:  Procedure Laterality Date  . CARDIOVERSION N/A 01/29/2015   Procedure: CARDIOVERSION;  Surgeon: [name], MD;  Location: [facility];  Service: Cardiovascular;  Laterality: N/A;  . CORONARY ANGIOPLASTY WITH STENT PLACEMENT  08/31/11   "2 today; makes total of 3 stents"  . CORONARY ANGIOPLASTY WITH STENT PLACEMENT  08/10/2017   "2 today; makes a total of 5 stents" (08/10/2017)  . CORONARY ARTERY BYPASS GRAFT  1998; 2007   CABG X 4; CABG X4  .  CORONARY STENT INTERVENTION N/A 08/10/2017   Procedure: CORONARY STENT INTERVENTION;  Surgeon: Sherren Mocha, MD;  Location: [facility] CV LAB;  Service: Cardiovascular;  Laterality: N/A;  . LEFT HEART CATH AND CORS/GRAFTS ANGIOGRAPHY N/A 08/10/2017   Procedure: LEFT HEART CATH AND CORS/GRAFTS ANGIOGRAPHY;  Surgeon: Sherren Mocha, MD;  Location: [facility];  Service: Cardiovascular;  Laterality: N/A;  . LEFT HEART CATHETERIZATION WITH CORONARY/GRAFT ANGIOGRAM N/A 08/31/2011   Procedure: LEFT HEART CATHETERIZATION WITH Beatrix Fetters;  Surgeon: [name], MD;  Location: [facility] CATH LAB;  Service: Cardiovascular;  Laterality: N/A;  . PERIPHERAL VASCULAR CATHETERIZATION N/A 04/27/2016   Procedure: Abdominal Aortogram w/Lower Extremity;  Surgeon: Harrell Gave  Nicole Cella, MD;  Location: [facility] CV LAB;  Service: Cardiovascular;  Laterality: N/A;  . TEE WITHOUT CARDIOVERSION N/A 01/29/2015   Procedure: TRANSESOPHAGEAL ECHOCARDIOGRAM (TEE);  Surgeon: [name], MD;  Location: [facility] ENDOSCOPY;  Service: Cardiovascular;  Laterality: N/A;   Social History   Socioeconomic History  . Marital status: Married    Spouse name: Not on file  . Number of children: Not on file  . Years of education: Not on file  . Highest education level: Not on file  Social Needs  . Financial resource strain: Not on file  . Food insecurity - worry: Not on file  . Food insecurity - inability: Not on file  . Transportation needs - medical: Not on file  . Transportation needs - non-medical: Not on file  Occupational History  . Not on file  Tobacco Use  . Smoking status: Former Smoker    Packs/day: 2.00    Years: 6.00    Pack years: 12.00    Types: Cigarettes    Last attempt to quit: 06/16/1971    Years since quitting: 46.2  . Smokeless tobacco: Former Systems developer    Quit date: 06/15/1986  . Tobacco comment: "chewed on cigars; never really did chew tobacco"  Substance and Sexual Activity  . Alcohol  use: No  . Drug use: No  . Sexual activity: No  Other Topics Concern  . Not on file  Social History Narrative  . Not on file     Review of Systems: General: negative for chills, fever, night sweats or weight changes.  Cardiovascular: negative for chest pain, dyspnea on exertion, edema, orthopnea, palpitations, paroxysmal nocturnal dyspnea or shortness of breath Dermatological: negative for rash Respiratory: negative for cough or wheezing Urologic: negative for hematuria Abdominal: negative for nausea, vomiting, diarrhea, bright red blood per rectum, melena, or hematemesis Neurologic: negative for visual changes, syncope, or dizziness All other systems reviewed and are otherwise negative except as noted above.   Physical Exam:  Blood pressure 130/78, pulse 62, height 5\' 11"  (1.803 m), weight 224 lb (101.6 kg), SpO2 98 %.  General appearance: alert, cooperative and no distress Neck: no carotid bruit and no JVD Lungs: clear to auscultation bilaterally Heart: regular rate and rhythm, S1, S2 normal, no murmur, click, rub or gallop Extremities: extremities normal, atraumatic, no cyanosis or edema Pulses: 2+ and symmetric Skin: Skin color, texture, turgor normal. No rashes or lesions Neurologic: Grossly normal  EKG not performed -- personally reviewed   ASSESSMENT AND PLAN:   1. CAD: s/p CABG in 1998 and had redo bypass again in 2007, repeat cath in 2013 with stenting of the obtuse marginal. Most recent cath 08/10/17 showed severe native 3 vessel CAD with total occlusion of the RCA and LAD, severe stenosis of the native left main and native circumflex. He underwent successful PCI of the left main and LCx using DES platforms. Triple therapy was recommended x 1 month (ASA, Plavix + Eliquis), followed by discontinuation of ASA therafter. He is doing well w/o recurrent angina. He reports full med compliance. Plan to d/c ASA in 3 more weeks. Continue statin, BB and ARB.   2. HLD: poorly  controlled. Recent LDL was 134 mg/dL, in the setting of statin noncompliance. He is back on Lipitor 40 mg and seems to be tolerating well w/o side effects. Recheck FLP and HFTs in 6 weeks. Goal LDL < 70 mg/dL. If not at goal, will try further increasing Lipitor to 80 mg vs, adding Zetia, depending on  tolerability/ side effects. May ultimately need PCSK9 inhibitor therapy if unable to get LDL at goal 2/2 statin intolerance.   3. DM: Recent Hgb A1c 7.9. Followed by PCP.   4. HTN: controlled on current regimen. 130/78 today. Continue Coreg, Losartan and Lasix.   Follow-Up w/ Dr. Tamala Julian in 2-3 months.   Daunte Oestreich Ladoris Gene, MHS [facility] 08/24/2017 10:49 AM

## 2017-08-30 ENCOUNTER — Encounter (HOSPITAL_COMMUNITY): Payer: Self-pay | Admitting: *Deleted

## 2017-08-30 ENCOUNTER — Other Ambulatory Visit: Payer: Self-pay

## 2017-08-30 ENCOUNTER — Inpatient Hospital Stay (HOSPITAL_COMMUNITY)
Admission: EM | Admit: 2017-08-30 | Discharge: 2017-09-04 | DRG: 309 | Disposition: A | Discharge status: Home / Self Care | Payer: Medicare HMO | Attending: Cardiology | Admitting: Cardiology

## 2017-08-30 ENCOUNTER — Emergency Department (HOSPITAL_COMMUNITY): Payer: Medicare HMO

## 2017-08-30 DIAGNOSIS — R7989 Other specified abnormal findings of blood chemistry: Secondary | ICD-10-CM

## 2017-08-30 DIAGNOSIS — Z87891 Personal history of nicotine dependence: Secondary | ICD-10-CM | POA: Diagnosis not present

## 2017-08-30 DIAGNOSIS — D509 Iron deficiency anemia, unspecified: Secondary | ICD-10-CM | POA: Diagnosis present

## 2017-08-30 DIAGNOSIS — Z7984 Long term (current) use of oral hypoglycemic drugs: Secondary | ICD-10-CM | POA: Diagnosis not present

## 2017-08-30 DIAGNOSIS — M545 Low back pain: Secondary | ICD-10-CM | POA: Diagnosis not present

## 2017-08-30 DIAGNOSIS — I110 Hypertensive heart disease with heart failure: Secondary | ICD-10-CM | POA: Diagnosis not present

## 2017-08-30 DIAGNOSIS — R55 Syncope and collapse: Secondary | ICD-10-CM | POA: Diagnosis not present

## 2017-08-30 DIAGNOSIS — I4892 Unspecified atrial flutter: Secondary | ICD-10-CM

## 2017-08-30 DIAGNOSIS — Z7902 Long term (current) use of antithrombotics/antiplatelets: Secondary | ICD-10-CM | POA: Diagnosis not present

## 2017-08-30 DIAGNOSIS — Z951 Presence of aortocoronary bypass graft: Secondary | ICD-10-CM | POA: Diagnosis not present

## 2017-08-30 DIAGNOSIS — M19041 Primary osteoarthritis, right hand: Secondary | ICD-10-CM | POA: Diagnosis not present

## 2017-08-30 DIAGNOSIS — G8929 Other chronic pain: Secondary | ICD-10-CM | POA: Diagnosis not present

## 2017-08-30 DIAGNOSIS — D500 Iron deficiency anemia secondary to blood loss (chronic): Secondary | ICD-10-CM | POA: Diagnosis not present

## 2017-08-30 DIAGNOSIS — E1151 Type 2 diabetes mellitus with diabetic peripheral angiopathy without gangrene: Secondary | ICD-10-CM | POA: Diagnosis present

## 2017-08-30 DIAGNOSIS — I2510 Atherosclerotic heart disease of native coronary artery without angina pectoris: Secondary | ICD-10-CM | POA: Diagnosis not present

## 2017-08-30 DIAGNOSIS — I25708 Atherosclerosis of coronary artery bypass graft(s), unspecified, with other forms of angina pectoris: Secondary | ICD-10-CM | POA: Diagnosis not present

## 2017-08-30 DIAGNOSIS — Z7901 Long term (current) use of anticoagulants: Secondary | ICD-10-CM

## 2017-08-30 DIAGNOSIS — M19042 Primary osteoarthritis, left hand: Secondary | ICD-10-CM | POA: Diagnosis present

## 2017-08-30 DIAGNOSIS — E669 Obesity, unspecified: Secondary | ICD-10-CM | POA: Diagnosis present

## 2017-08-30 DIAGNOSIS — I5042 Chronic combined systolic (congestive) and diastolic (congestive) heart failure: Secondary | ICD-10-CM | POA: Diagnosis not present

## 2017-08-30 DIAGNOSIS — I200 Unstable angina: Secondary | ICD-10-CM | POA: Diagnosis not present

## 2017-08-30 DIAGNOSIS — Z8249 Family history of ischemic heart disease and other diseases of the circulatory system: Secondary | ICD-10-CM | POA: Diagnosis not present

## 2017-08-30 DIAGNOSIS — Z6831 Body mass index (BMI) 31.0-31.9, adult: Secondary | ICD-10-CM

## 2017-08-30 DIAGNOSIS — R195 Other fecal abnormalities: Secondary | ICD-10-CM | POA: Diagnosis not present

## 2017-08-30 DIAGNOSIS — I248 Other forms of acute ischemic heart disease: Secondary | ICD-10-CM | POA: Diagnosis not present

## 2017-08-30 DIAGNOSIS — K2211 Ulcer of esophagus with bleeding: Secondary | ICD-10-CM | POA: Diagnosis not present

## 2017-08-30 DIAGNOSIS — N289 Disorder of kidney and ureter, unspecified: Secondary | ICD-10-CM | POA: Diagnosis not present

## 2017-08-30 DIAGNOSIS — E7800 Pure hypercholesterolemia, unspecified: Secondary | ICD-10-CM | POA: Diagnosis not present

## 2017-08-30 DIAGNOSIS — K429 Umbilical hernia without obstruction or gangrene: Secondary | ICD-10-CM | POA: Diagnosis present

## 2017-08-30 DIAGNOSIS — D649 Anemia, unspecified: Secondary | ICD-10-CM | POA: Diagnosis not present

## 2017-08-30 DIAGNOSIS — C160 Malignant neoplasm of cardia: Secondary | ICD-10-CM | POA: Diagnosis present

## 2017-08-30 DIAGNOSIS — C799 Secondary malignant neoplasm of unspecified site: Secondary | ICD-10-CM | POA: Diagnosis not present

## 2017-08-30 DIAGNOSIS — R778 Other specified abnormalities of plasma proteins: Secondary | ICD-10-CM

## 2017-08-30 DIAGNOSIS — D582 Other hemoglobinopathies: Secondary | ICD-10-CM | POA: Diagnosis not present

## 2017-08-30 DIAGNOSIS — I252 Old myocardial infarction: Secondary | ICD-10-CM

## 2017-08-30 DIAGNOSIS — R748 Abnormal levels of other serum enzymes: Secondary | ICD-10-CM

## 2017-08-30 DIAGNOSIS — E785 Hyperlipidemia, unspecified: Secondary | ICD-10-CM | POA: Diagnosis present

## 2017-08-30 DIAGNOSIS — N179 Acute kidney failure, unspecified: Secondary | ICD-10-CM | POA: Diagnosis not present

## 2017-08-30 DIAGNOSIS — I25709 Atherosclerosis of coronary artery bypass graft(s), unspecified, with unspecified angina pectoris: Secondary | ICD-10-CM | POA: Diagnosis not present

## 2017-08-30 DIAGNOSIS — C155 Malignant neoplasm of lower third of esophagus: Secondary | ICD-10-CM | POA: Diagnosis not present

## 2017-08-30 DIAGNOSIS — I10 Essential (primary) hypertension: Secondary | ICD-10-CM | POA: Diagnosis not present

## 2017-08-30 DIAGNOSIS — I25810 Atherosclerosis of coronary artery bypass graft(s) without angina pectoris: Secondary | ICD-10-CM

## 2017-08-30 DIAGNOSIS — Z955 Presence of coronary angioplasty implant and graft: Secondary | ICD-10-CM

## 2017-08-30 DIAGNOSIS — E119 Type 2 diabetes mellitus without complications: Secondary | ICD-10-CM

## 2017-08-30 DIAGNOSIS — C159 Malignant neoplasm of esophagus, unspecified: Secondary | ICD-10-CM | POA: Diagnosis not present

## 2017-08-30 LAB — COMPREHENSIVE METABOLIC PANEL
ALBUMIN: 3.5 g/dL (ref 3.5–5.0)
ALK PHOS: 57 U/L (ref 38–126)
ALT: 14 U/L — ABNORMAL LOW (ref 17–63)
AST: 20 U/L (ref 15–41)
Anion gap: 10 (ref 5–15)
BILIRUBIN TOTAL: 1.1 mg/dL (ref 0.3–1.2)
BUN: 16 mg/dL (ref 6–20)
CO2: 21 mmol/L — AB (ref 22–32)
Calcium: 8.8 mg/dL — ABNORMAL LOW (ref 8.9–10.3)
Chloride: 105 mmol/L (ref 101–111)
Creatinine, Ser: 1.12 mg/dL (ref 0.61–1.24)
GFR calc Af Amer: >60 mL/min (ref >60)
GFR calc non Af Amer: >60 mL/min (ref >60)
GLUCOSE: 229 mg/dL — AB (ref 65–99)
Potassium: 3.6 mmol/L (ref 3.5–5.1)
SODIUM: 136 mmol/L (ref 135–145)
TOTAL PROTEIN: 7 g/dL (ref 6.5–8.1)

## 2017-08-30 LAB — I-STAT TROPONIN, ED: Troponin i, poc: 0.36 ng/mL (ref 0.00–0.08)

## 2017-08-30 LAB — CBC
HEMATOCRIT: 28.9 % — AB (ref 39.0–52.0)
HEMOGLOBIN: 8.5 g/dL — AB (ref 13.0–17.0)
MCH: 22.1 pg — AB (ref 26.0–34.0)
MCHC: 29.4 g/dL — ABNORMAL LOW (ref 30.0–36.0)
MCV: 75.1 fL — AB (ref 78.0–100.0)
Platelets: 361 10*3/uL (ref 150–400)
RBC: 3.85 MIL/uL — ABNORMAL LOW (ref 4.22–5.81)
RDW: 17.7 % — ABNORMAL HIGH (ref 11.5–15.5)
WBC: 11.4 10*3/uL — ABNORMAL HIGH (ref 4.0–10.5)

## 2017-08-30 LAB — TROPONIN I
TROPONIN I: 0.44 ng/mL — AB (ref <0.03)
Troponin I: 0.5 ng/mL (ref <0.03)

## 2017-08-30 LAB — MRSA PCR SCREENING: MRSA BY PCR: NEGATIVE

## 2017-08-30 LAB — TSH: TSH: 0.671 u[IU]/mL (ref 0.350–4.500)

## 2017-08-30 MED ORDER — APIXABAN 5 MG PO TABS
5.0000 mg | ORAL_TABLET | Freq: Two times a day (BID) | ORAL | Status: DC
  Administered 2017-08-30 – 2017-08-31 (×2): 5 mg via ORAL
  Filled 2017-08-30 (×2): qty 1

## 2017-08-30 MED ORDER — GABAPENTIN 300 MG PO CAPS
300.0000 mg | ORAL_CAPSULE | Freq: Two times a day (BID) | ORAL | Status: DC
  Administered 2017-08-30 – 2017-09-04 (×10): 300 mg via ORAL
  Filled 2017-08-30 (×11): qty 1

## 2017-08-30 MED ORDER — PANTOPRAZOLE SODIUM 40 MG PO TBEC
40.0000 mg | DELAYED_RELEASE_TABLET | Freq: Every day | ORAL | Status: DC
  Administered 2017-08-30 – 2017-09-04 (×6): 40 mg via ORAL
  Filled 2017-08-30 (×6): qty 1

## 2017-08-30 MED ORDER — LINAGLIPTIN 5 MG PO TABS
5.0000 mg | ORAL_TABLET | Freq: Every day | ORAL | Status: DC
  Administered 2017-08-30 – 2017-09-04 (×6): 5 mg via ORAL
  Filled 2017-08-30 (×7): qty 1

## 2017-08-30 MED ORDER — FUROSEMIDE 40 MG PO TABS
40.0000 mg | ORAL_TABLET | Freq: Every day | ORAL | Status: DC
  Administered 2017-08-31 – 2017-09-02 (×3): 40 mg via ORAL
  Filled 2017-08-30: qty 2
  Filled 2017-08-30 (×3): qty 1

## 2017-08-30 MED ORDER — ACETAMINOPHEN 325 MG PO TABS: 650.0000 mg | ORAL_TABLET | ORAL | Status: DC | PRN

## 2017-08-30 MED ORDER — CLOPIDOGREL BISULFATE 75 MG PO TABS
75.0000 mg | ORAL_TABLET | Freq: Every day | ORAL | Status: DC
  Administered 2017-08-31 – 2017-09-04 (×5): 75 mg via ORAL
  Filled 2017-08-30 (×6): qty 1

## 2017-08-30 MED ORDER — NITROGLYCERIN 0.4 MG SL SUBL
0.4000 mg | SUBLINGUAL_TABLET | SUBLINGUAL | Status: DC | PRN
Start: 2017-08-30 — End: 2017-09-04

## 2017-08-30 MED ORDER — CARVEDILOL 6.25 MG PO TABS
6.2500 mg | ORAL_TABLET | Freq: Two times a day (BID) | ORAL | Status: DC
  Administered 2017-08-30 – 2017-09-03 (×8): 6.25 mg via ORAL
  Filled 2017-08-30 (×9): qty 1

## 2017-08-30 MED ORDER — ATORVASTATIN CALCIUM 40 MG PO TABS
40.0000 mg | ORAL_TABLET | Freq: Every day | ORAL | Status: DC
  Administered 2017-08-30 – 2017-09-03 (×5): 40 mg via ORAL
  Filled 2017-08-30 (×5): qty 1

## 2017-08-30 MED ORDER — INSULIN GLARGINE 100 UNIT/ML ~~LOC~~ SOLN
50.0000 [IU] | Freq: Every day | SUBCUTANEOUS | Status: DC
  Administered 2017-08-30 – 2017-09-04 (×6): 50 [IU] via SUBCUTANEOUS
  Filled 2017-08-30 (×6): qty 0.5

## 2017-08-30 MED ORDER — FERROUS SULFATE 325 (65 FE) MG PO TABS
325.0000 mg | ORAL_TABLET | Freq: Every day | ORAL | Status: DC
  Administered 2017-08-30 – 2017-08-31 (×2): 325 mg via ORAL
  Filled 2017-08-30 (×2): qty 1

## 2017-08-30 MED ORDER — LOSARTAN POTASSIUM-HCTZ 100-12.5 MG PO TABS: 1.0000 | ORAL_TABLET | Freq: Every day | ORAL | Status: DC

## 2017-08-30 MED ORDER — LOSARTAN POTASSIUM 50 MG PO TABS
100.0000 mg | ORAL_TABLET | Freq: Every day | ORAL | Status: DC
  Administered 2017-08-31 – 2017-09-04 (×5): 100 mg via ORAL
  Filled 2017-08-30 (×5): qty 2

## 2017-08-30 MED ORDER — DILTIAZEM HCL-DEXTROSE 100-5 MG/100ML-% IV SOLN (PREMIX)
5.0000 mg/h | INTRAVENOUS | Status: DC
  Administered 2017-08-30: 5 mg/h via INTRAVENOUS
  Administered 2017-08-31: 15 mg/h via INTRAVENOUS
  Filled 2017-08-30 (×3): qty 100

## 2017-08-30 MED ORDER — HYDROCHLOROTHIAZIDE 12.5 MG PO CAPS
12.5000 mg | ORAL_CAPSULE | Freq: Every day | ORAL | Status: DC
  Administered 2017-08-31 – 2017-09-01 (×2): 12.5 mg via ORAL
  Filled 2017-08-30 (×3): qty 1

## 2017-08-30 NOTE — ED Provider Notes (Signed)
[facility] EMERGENCY DEPARTMENT Provider Note   CSN: [id number] Arrival date & time: 08/30/17  5465     History   Chief Complaint Chief Complaint  Patient presents with  . Near Syncope    HPI Justin Blackburn. is a 67 y.o. male.  HPI 67 year old male who is nearly 3 weeks out from 2 new recent cardiac stents.  He presents back to the emergency department with exertional lightheadedness and new bilateral shoulder pain worse over the past 3 days.  He states he can only walk short distances before he becomes extremely lightheaded.  He reports new bilateral shoulder aching pain which is similar to his prior anginal symptoms.  Patient has a complex past cardiac history with multiple interventions.  No fevers or chills.  No shortness of breath.  No symptoms at rest.  Denies black or bloody stool.  No vomiting.  Eating and drinking normally.   Past Medical History:  Diagnosis Date  . Angina   . Arthritis    "hands" (08/10/2017)  . Atrial flutter, paroxysmal ([place])    arrived with aflutter with RVR, EF down to 25-30%, s/p TEE DCCV on 01/29/2015  . Chronic lower back pain    "all the time from my sciatic nerve"  . Erectile dysfunction   . Heart failure ([place])   . Hypercholesteremia   . Hypertension   . 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 Myocardial infarction ([place]) 1988; ~0354;  ~1996;~ 2000  . Obesity   . Peripheral vascular disease ([place])   . Umbilical hernia    unrepaired    Patient Active Problem List   Diagnosis Date Noted  . Coronary artery disease with exertional angina ([place]) 08/10/2017  . Coronary artery disease involving coronary bypass graft of native heart with angina pectoris ([place]) 08/28/2016  . 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 Anticoagulated 02/06/2015  . Acute on chronic combined systolic and diastolic heart failure ([place]) 01/30/2015  . Demand ischemia ([place]) 01/30/2015  . Atrial  flutter with rapid ventricular response ([place]) 01/28/2015  . Chronic combined systolic and diastolic heart failure, NYHA class 2 ([place]) 04/17/2013  . Umbilical hernia   . Myocardial infarction ([place])   . Peripheral vascular disease ([place])   . Hypercholesteremia   . Hypertension 09/01/2011  . Diabetes mellitus ([place]) 09/01/2011    Past Surgical History:  Procedure Laterality Date  . CARDIOVERSION N/A 01/29/2015   Procedure: CARDIOVERSION;  Surgeon: [name], MD;  Location: [facility];  Service: Cardiovascular;  Laterality: N/A;  . CORONARY ANGIOPLASTY WITH STENT PLACEMENT  08/31/11   "2 today; makes total of 3 stents"  . CORONARY ANGIOPLASTY WITH STENT PLACEMENT  08/10/2017   "2 today; makes a total of 5 stents" (08/10/2017)  . CORONARY ARTERY BYPASS GRAFT  1998; 2007   CABG X 4; CABG X4  . CORONARY STENT INTERVENTION N/A 08/10/2017   Procedure: CORONARY STENT INTERVENTION;  Surgeon: Sherren Mocha, MD;  Location: [facility];  Service: Cardiovascular;  Laterality: N/A;  . LEFT HEART CATH AND CORS/GRAFTS ANGIOGRAPHY N/A 08/10/2017   Procedure: LEFT HEART CATH AND CORS/GRAFTS ANGIOGRAPHY;  Surgeon: Sherren Mocha, MD;  Location: [facility];  Service: Cardiovascular;  Laterality: N/A;  . LEFT HEART CATHETERIZATION WITH CORONARY/GRAFT ANGIOGRAM N/A 08/31/2011   Procedure: LEFT HEART CATHETERIZATION WITH Beatrix Fetters;  Surgeon: [name], MD;  Location: [facility] CATH LAB;  Service: Cardiovascular;  Laterality: N/A;  .  PERIPHERAL VASCULAR CATHETERIZATION N/A 04/27/2016   Procedure: Abdominal Aortogram w/Lower Extremity;  Surgeon: Angelia Mould, MD;  Location: [facility];  Service: Cardiovascular;  Laterality: N/A;  . TEE WITHOUT CARDIOVERSION N/A 01/29/2015   Procedure: TRANSESOPHAGEAL ECHOCARDIOGRAM (TEE);  Surgeon: [name], MD;  Location: [facility] ENDOSCOPY;  Service: Cardiovascular;  Laterality: N/A;       Home Medications    Prior to  Admission medications   Medication Sig Start Date End Date Taking? Authorizing Provider  atorvastatin (LIPITOR) 40 MG tablet Take 40 mg by mouth daily at 6 PM.  07/14/17  Yes [provider]  carvedilol (COREG) 6.25 MG tablet Take 6.25 mg by mouth 2 (two) times daily.   Yes [provider]  clopidogrel (PLAVIX) 75 MG tablet Take 1 tablet (75 mg total) by mouth daily. Take for 6 months. Plan to stop on August 27th. Check with cardiologist at that time. 08/11/17  Yes Daune Perch, NP  ELIQUIS 5 MG TABS tablet TAKE 1 TABLET BY MOUTH TWICE A DAY 07/14/17  Yes Belva Crome, MD  ferrous sulfate 325 (65 FE) MG tablet Take 325 mg by mouth daily. 07/20/17  Yes [provider]  furosemide (LASIX) 40 MG tablet Take 1 tablet (40 mg total) by mouth daily. 09/07/16  Yes Belva Crome, MD  gabapentin (NEURONTIN) 300 MG capsule Take 300 mg by mouth 2 (two) times daily. 07/09/17  Yes [provider]  LANTUS SOLOSTAR 100 UNIT/ML Solostar Pen Inject 50 Units into the skin daily.  12/07/14  Yes [provider]  losartan-hydrochlorothiazide (HYZAAR) 100-12.5 MG tablet TAKE 1 (ONE) TABLET BY MOUTH DAILY FOR HIGH BP 07/14/16  Yes [provider]  metFORMIN (GLUCOPHAGE) 1000 MG tablet Take 1,000 mg by mouth 2 (two) times daily. 04/12/14  Yes [provider]  sitaGLIPtin (JANUVIA) 100 MG tablet Take 100 mg by mouth daily.   Yes [provider]  feeding supplement, ENSURE ENLIVE, (ENSURE ENLIVE) LIQD Take 237 mLs by mouth 2 (two) times daily between meals. Patient not taking: Reported on 08/30/2017 08/11/17   Daune Perch, NP  nitroGLYCERIN (NITROSTAT) 0.4 MG SL tablet Place 1 tablet (0.4 mg total) under the tongue every 5 (five) minutes as needed for chest pain ([place]). 06/18/14 08/09/18  Belva Crome, MD    Family History Family History  Problem Relation Age of Onset  . Heart disease Mother   . Heart attack Mother   . Heart disease Father   .  Heart attack Father   . Heart attack Brother   . Stroke Neg Hx     Social History Social History   Tobacco Use  . Smoking status: Former Smoker    Packs/day: 2.00    Years: 6.00    Pack years: 12.00    Types: Cigarettes    Last attempt to quit: 06/16/1971    Years since quitting: 46.2  . Smokeless tobacco: Former Systems developer    Quit date: 06/15/1986  . Tobacco comment: "chewed on cigars; never really did chew tobacco"  Substance Use Topics  . Alcohol use: No  . Drug use: No     Allergies   Patient has no known allergies.   Review of Systems Review of Systems  All other systems reviewed and are negative.    Physical Exam Updated Vital Signs BP (!) 126/98   Pulse (!) 111   Temp 98.4 F (36.9 C) (Oral)   Resp 19   Ht 5\' 11"  (1.803 m)  Wt 101.6 kg (224 lb)   SpO2 100%   BMI 31.24 kg/m   Physical Exam  Constitutional: He is oriented to person, place, and time. He appears well-developed and well-nourished.  HENT:  Head: Normocephalic and atraumatic.  Eyes: EOM are normal.  Neck: Normal range of motion.  Cardiovascular: Regular rhythm.  Tachycardic rate.  Mild systolic ejection murmur  Pulmonary/Chest: Effort normal and breath sounds normal. No respiratory distress.  Abdominal: Soft. He exhibits no distension. There is no tenderness.  Musculoskeletal: Normal range of motion. He exhibits no edema.  Neurological: He is alert and oriented to person, place, and time.  Skin: Skin is warm and dry.  Psychiatric: He has a normal mood and affect. Judgment normal.  Nursing note and vitals reviewed.    ED Treatments / Results  Labs (all labs ordered are listed, but only abnormal results are displayed) Labs Reviewed  CBC - Abnormal; Notable for the following components:      Result Value   WBC 11.4 (*)    RBC 3.85 (*)    Hemoglobin 8.5 (*)    HCT 28.9 (*)    MCV 75.1 (*)    MCH 22.1 (*)    MCHC 29.4 (*)    RDW 17.7 (*)    All other components within normal limits    COMPREHENSIVE METABOLIC PANEL - Abnormal; Notable for the following components:   CO2 21 (*)    Glucose, Bld 229 (*)    Calcium 8.8 (*)    ALT 14 (*)    All other components within normal limits  I-STAT TROPONIN, ED - Abnormal; Notable for the following components:   Troponin i, poc 0.36 (*)    All other components within normal limits    EKG  EKG Interpretation  Date/Time:  Monday August 30 2017 09:40:59 EDT Ventricular Rate:  148 PR Interval:    QRS Duration: 134 QT Interval:  332 QTC Calculation: 521 R Axis:   -98 Text Interpretation:  Atrial flutter with variable A-V block Right superior axis deviation Non-specific intra-ventricular conduction block Cannot rule out Anteroseptal infarct , age undetermined Abnormal ECG has changed Confirmed by Jola Schmidt [phone]) on 08/30/2017 10:02:39 AM       Radiology Dg Chest Portable 1 View  Result Date: 08/30/2017 CLINICAL DATA:  Near syncopal episodes since 08/26/2017. EXAM: PORTABLE CHEST 1 VIEW COMPARISON:  PA and lateral chest 01/29/2015. FINDINGS: The patient is status post CABG. There is cardiomegaly without edema. Lungs are clear. No pneumothorax or pleural effusion. No acute bony abnormality. IMPRESSION: Cardiomegaly without acute disease. Electronically Signed   By: Inge Rise M.D.   On: 08/30/2017 10:22    Procedures .Critical Care Performed by: Jola Schmidt, MD Authorized by: Jola Schmidt, MD     CRITICAL CARE Performed by: Jola Schmidt Total critical care time: 22minutes Critical care time was exclusive of separately billable procedures and treating other patients. Critical care was necessary to treat or prevent imminent or life-threatening deterioration. Critical care was time spent personally by me on the following activities: development of treatment plan with patient and/or surrogate as well as nursing, discussions with consultants, evaluation of patient's response to treatment, examination of patient,  obtaining history from patient or surrogate, ordering and performing treatments and interventions, ordering and review of laboratory studies, ordering and review of radiographic studies, pulse oximetry and re-evaluation of patient's condition.  Cardiology to evaluate at bedside    Medications Ordered in ED Medications - No data to display  Initial Impression / Assessment and Plan / ED Course  I have reviewed the triage vital signs and the nursing notes.  Pertinent labs & imaging results that were available during my care of the patient were reviewed by me and considered in my medical decision making (see chart for details).     Initial EKG concerning for atrial flutter.  Patient is on Eliquis.  He has no exertional symptoms given his recent cardiac stents I think it is worthwhile to have cardiology evaluate the patient bedside.  New mild anemia as compared to baseline.  No black or bloody stools.  Hemoglobin will need to be trended.  Final Clinical Impressions(s) / ED Diagnoses   Final diagnoses:  Unstable angina ([place])  Elevated troponin    ED Discharge Orders    None       Jola Schmidt, MD 08/30/17 1141

## 2017-08-30 NOTE — Progress Notes (Signed)
   DCCV schedule for 3/19 is full. However instructions were to still keep NPO at midnight, in the event that a space opens up tomorrow morning.

## 2017-08-30 NOTE — ED Notes (Signed)
Cardiology made aware of pt's elevated troponin. Pt 0/10 pain

## 2017-08-30 NOTE — ED Triage Notes (Addendum)
Pt states near-syncopal episodes since last Thursday.  Came in today b/c it is getting worse - just moving from sitting to standing position.  In a-flutter presently.  AO x 4.  2 stents placed 2 weeks ago.

## 2017-08-30 NOTE — ED Notes (Signed)
MDs @ bedside

## 2017-08-30 NOTE — H&P (Signed)
Cardiology Admission History and Physical:   Patient ID: Justin Blackburn.; MRN: [id number]; DOB: January 21, 1951   Admission date: 08/30/2017  Primary Care Provider: [facility] Primary Cardiologist: [name], MD   Chief Complaint:  Near Syncope, Exertional Dyspsnea  Patient Profile:   Justin Blackburn. is a 67 y.o. male with a history of CAD with recent PCI + DES to the LM and LCx 08/10/17, h/o atrial flutter s/p DCCV in 2016, chronic anticoagulation with Eliquis, HTN, HLD and chronic systolic HF, presenting back to the ED with complaint of near syncope and exertional dyspnea. EKG shows rapid atrial flutter in the 140s. POC troponin elevated at 0.36.   History of Present Illness:   He is followed by Dr. Tamala Julian and has significant history of CAD. He underwent CABG in 1998 and had redo bypass again in 2007. His last LHC in 2013 showed advanced coronary atherosclerotic disease with total occlusion of the LAD and native right coronary. Total occlusion of the circumflex after the large first obtuse marginal. The obtuse marginal #1 containedan eccentric 80-90% stenosis. Widely patent right internal mammary and left internal mammary graft to the right coronary and LAD respectively. He underwent successful stenting of the obtuse marginal to 0% with TIMI grade 3 flow. Ff at that time was 40%. Additional medical problems include HTN, DM, HLD, PAF and chronic anticoagulation therapy with Eliquis.   He presented to clinic on 08/05/17 with class III angina and was subsequently set up for [facility], performed by Dr. Burt Knack on 08/10/17. He was found to have severe native 3 vessel CAD with total occlusion of the RCA and LAD, severe stenosis of the native left main and native circumflex. He underwent successful PCI of the left main and LCx using DES platforms. He tolerated the procedure well. Triple therapy was recommended x 1 month (ASA, Plavix + Eliquis), followed by discontinuation of ASA  therafter. He was also restarted on Lipitor 40 mg. Pt had discontinued it previously given mild arthralgias. Recent lipid panel 06/2017 showed elevated LDL at 134 mg/dL. TG were 243. Pt also continued on BB and ARB. Pt was seen back in clinic for post hospital f/u last week and was doing well w/o any issues.   He now presents back to the ED with exertional near syncope, dizziness and dyspnea. He denies anterior chest pain. He had a brief episode of left shoulder discomfort but this has resolved. He reports full med compliance. Symptom only occurs when he gets up and tries to move around. He is completely asymptomatic at rest.   In ED, EKG shows rapid atrial flutter in the 140s. Initial BP was elevated at 155/101, but has improved to 125/83. POC troponin is abnormal at 0.36. WBC is 11.4 but he is afebrile. SCr 1.12. BUN 16. Hgb 8.5, down from 10.1 pre cath. As noted above, he has been on triple therapy w/ ASA, Plavix and Eliquis.   Past Medical History:  Diagnosis Date  . Angina   . Arthritis    "hands" (08/10/2017)  . Atrial flutter, paroxysmal ([place])    arrived with aflutter with RVR, EF down to 25-30%, s/p TEE DCCV on 01/29/2015  . Chronic lower back pain    "all the time from my sciatic nerve"  . Erectile dysfunction   . Heart failure ([place])   . Hypercholesteremia   . Hypertension   . 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 Myocardial infarction ([place]) 1988; ~1992;  ~  1996;~ 2000  . Obesity   . Peripheral vascular disease ([place])   . Umbilical hernia    unrepaired    Past Surgical History:  Procedure Laterality Date  . CARDIOVERSION N/A 01/29/2015   Procedure: CARDIOVERSION;  Surgeon: [name], MD;  Location: [facility];  Service: Cardiovascular;  Laterality: N/A;  . CORONARY ANGIOPLASTY WITH STENT PLACEMENT  08/31/11   "2 today; makes total of 3 stents"  . CORONARY ANGIOPLASTY WITH STENT PLACEMENT  08/10/2017   "2 today; makes a total of 5 stents" (08/10/2017)    . CORONARY ARTERY BYPASS GRAFT  1998; 2007   CABG X 4; CABG X4  . CORONARY STENT INTERVENTION N/A 08/10/2017   Procedure: CORONARY STENT INTERVENTION;  Surgeon: Sherren Mocha, MD;  Location: [facility];  Service: Cardiovascular;  Laterality: N/A;  . LEFT HEART CATH AND CORS/GRAFTS ANGIOGRAPHY N/A 08/10/2017   Procedure: LEFT HEART CATH AND CORS/GRAFTS ANGIOGRAPHY;  Surgeon: Sherren Mocha, MD;  Location: [facility];  Service: Cardiovascular;  Laterality: N/A;  . LEFT HEART CATHETERIZATION WITH CORONARY/GRAFT ANGIOGRAM N/A 08/31/2011   Procedure: LEFT HEART CATHETERIZATION WITH Beatrix Fetters;  Surgeon: [name], MD;  [facility] C[facility];  Service: Cardiovascular;  Laterality: N/A;  . PERIPHERAL VASCULAR CATHETERIZATION N/A 04/27/2016   Procedure: Abdominal Aortogram w/Lower Extremity;  Surgeon: Angelia Mould, MD;  Location: [facility];  Service: Cardiovascular;  Laterality: N/A;  . TEE WITHOUT CARDIOVERSION N/A 01/29/2015   Procedure: TRANSESOPHAGEAL ECHOCARDIOGRAM (TEE);  Surgeon: [name], MD;  Location: [facility] ENDOSCOPY;  Service: Cardiovascular;  Laterality: N/A;     Medications Prior to Admission: Prior to Admission medications   Medication Sig Start Date End Date Taking? Authorizing Provider  atorvastatin (LIPITOR) 40 MG tablet Take 40 mg by mouth daily at 6 PM.  07/14/17  Yes [provider]  carvedilol (COREG) 6.25 MG tablet Take 6.25 mg by mouth 2 (two) times daily.   Yes [provider]  clopidogrel (PLAVIX) 75 MG tablet Take 1 tablet (75 mg total) by mouth daily. Take for 6 months. Plan to stop on August 27th. Check with cardiologist at that time. 08/11/17  Yes Daune Perch, NP  ELIQUIS 5 MG TABS tablet TAKE 1 TABLET BY MOUTH TWICE A DAY 07/14/17  Yes Belva Crome, MD  ferrous sulfate 325 (65 FE) MG tablet Take 325 mg by mouth daily. 07/20/17  Yes [provider]  furosemide (LASIX) 40 MG tablet Take  1 tablet (40 mg total) by mouth daily. 09/07/16  Yes Belva Crome, MD  gabapentin (NEURONTIN) 300 MG capsule Take 300 mg by mouth 2 (two) times daily. 07/09/17  Yes [provider]  LANTUS SOLOSTAR 100 UNIT/ML Solostar Pen Inject 50 Units into the skin daily.  12/07/14  Yes [provider]  losartan-hydrochlorothiazide (HYZAAR) 100-12.5 MG tablet TAKE 1 (ONE) TABLET BY MOUTH DAILY FOR HIGH BP 07/14/16  Yes [provider]  metFORMIN (GLUCOPHAGE) 1000 MG tablet Take 1,000 mg by mouth 2 (two) times daily. 04/12/14  Yes [provider]  sitaGLIPtin (JANUVIA) 100 MG tablet Take 100 mg by mouth daily.   Yes [provider]  feeding supplement, ENSURE ENLIVE, (ENSURE ENLIVE) LIQD Take 237 mLs by mouth 2 (two) times daily between meals. Patient not taking: Reported on 08/30/2017 08/11/17   Daune Perch, NP  nitroGLYCERIN (NITROSTAT) 0.4 MG SL tablet Place 1 tablet (0.4 mg total) under the tongue every 5 (five) minutes as needed for chest pain (  Angina). 06/18/14 08/09/18  Belva Crome, MD     Allergies:   No Known Allergies  Social History:   Social History   Socioeconomic History  . Marital status: Married    Spouse name: Not on file  . Number of children: Not on file  . Years of education: Not on file  . Highest education level: Not on file  Social Needs  . Financial resource strain: Not on file  . Food insecurity - worry: Not on file  . Food insecurity - inability: Not on file  . Transportation needs - medical: Not on file  . Transportation needs - non-medical: Not on file  Occupational History  . Not on file  Tobacco Use  . Smoking status: Former Smoker    Packs/day: 2.00    Years: 6.00    Pack years: 12.00    Types: Cigarettes    Last attempt to quit: 06/16/1971    Years since quitting: 46.2  . Smokeless tobacco: Former Systems developer    Quit date: 06/15/1986  . Tobacco comment: "chewed on cigars; never really did chew tobacco"  Substance and Sexual  Activity  . Alcohol use: No  . Drug use: No  . Sexual activity: No  Other Topics Concern  . Not on file  Social History Narrative  . Not on file    Family History:   The patient's family history includes Heart attack in his brother, father, and mother; Heart disease in his father and mother. There is no history of Stroke.    ROS:  Please see the history of present illness.  All other ROS reviewed and negative.     Physical Exam/Data:   Vitals:   08/30/17 1015 08/30/17 1018 08/30/17 1030 08/30/17 1130  BP:   (!) 139/97 (!) 126/98  Pulse: (!) 111 (!) 111 (!) 110 (!) 111  Resp: (!) 22 (!) 24 (!) 25 19  Temp:      TempSrc:      SpO2: 100% 100% 98% 100%  Weight:      Height:       No intake or output data in the 24 hours ending 08/30/17 1201 Filed Weights   08/30/17 0949  Weight: 224 lb (101.6 kg)   Body mass index is 31.24 kg/m.  General:  Well nourished, well developed, in no acute distress HEENT: normal Lymph: no adenopathy Neck: no JVD Endocrine:  No thryomegaly Vascular: No carotid bruits; FA pulses 2+ bilaterally without bruits  Cardiac:  Irregular rhythm, tachy rate  Lungs:  clear to auscultation bilaterally, no wheezing, rhonchi or rales  Abd: soft, nontender, no hepatomegaly  Ext: no edema Musculoskeletal:  No deformities, BUE and BLE strength normal and equal Skin: warm and dry  Neuro:  CNs 2-12 intact, no focal abnormalities noted Psych:  Normal affect    EKG:  The ECG that was done 08/30/17 was personally reviewed and demonstrates atrial flutter in the 140s  Relevant CV Studies: Procedures   CORONARY STENT INTERVENTION  LEFT HEART CATH AND CORS/GRAFTS ANGIOGRAPHY  Conclusion     Ost LAD lesion is 100% stenosed.  Prox RCA lesion is 100% stenosed.  Ost LM to Dist LM lesion is 75% stenosed.  Prox Cx to Mid Cx lesion is 25% stenosed.  Prox Cx lesion is 70% stenosed.  LIMA graft was visualized by angiography and is normal in caliber.  The  graft exhibits no disease.  RIMA graft was visualized by non-selective angiography.  A drug-eluting stent was successfully placed  using a STENT SYNERGY DES 3X24.  Post intervention, there is a 0% residual stenosis.  Post intervention, there is a 0% residual stenosis.  A drug-eluting stent was successfully placed.  Post intervention, there is a 0% residual stenosis.  1. Severe native 3 vessel CAD with total occlusion of the RCA and LAD, severe stenosis of the native left main and native circumflex 2. Successful PCI of the left main and LCx using DES platforms      Laboratory Data:  Chemistry Recent Labs  Lab 08/30/17 1004  NA 136  K 3.6  CL 105  CO2 21*  GLUCOSE 229*  BUN 16  CREATININE 1.12  CALCIUM 8.8*  GFRNONAA >60  GFRAA >60  ANIONGAP 10    Recent Labs  Lab 08/30/17 1004  PROT 7.0  ALBUMIN 3.5  AST 20  ALT 14*  ALKPHOS 57  BILITOT 1.1   Hematology Recent Labs  Lab 08/30/17 0952  WBC 11.4*  RBC 3.85*  HGB 8.5*  HCT 28.9*  MCV 75.1*  MCH 22.1*  MCHC 29.4*  RDW 17.7*  PLT 361   Cardiac EnzymesNo results for input(s): TROPONINI in the last 168 hours.  Recent Labs  Lab 08/30/17 1027  TROPIPOC 0.36*    BNPNo results for input(s): BNP, PROBNP in the last 168 hours.  DDimer No results for input(s): DDIMER in the last 168 hours.  Radiology/Studies:  Dg Chest Portable 1 View  Result Date: 08/30/2017 CLINICAL DATA:  Near syncopal episodes since 08/26/2017. EXAM: PORTABLE CHEST 1 VIEW COMPARISON:  PA and lateral chest 01/29/2015. FINDINGS: The patient is status post CABG. There is cardiomegaly without edema. Lungs are clear. No pneumothorax or pleural effusion. No acute bony abnormality. IMPRESSION: Cardiomegaly without acute disease. Electronically Signed   By: Inge Rise M.D.   On: 08/30/2017 10:22    Assessment and Plan:   Justin Blackburn. is a 67 y.o. male with a history of CAD s/p CABG in 1998 followed by redo CABG in 2007 with  recent PCI + DES to the LM and LCx 08/10/17 (on triple therapy x 1 month), h/o atrial flutter s/p DCCV in 2016, chronic anticoagulation with Eliquis, HTN, HLD and chronic systolic HF, presenting back to the ED with complaint of near syncope and exertional dyspnea. EKG shows rapid atrial flutter in the 140s. POC troponin elevated at 0.36. CBC also notable for worsening anemia, w/ drop in Hgb from 10.1 pre cath to 8.5 today.   1. CAD: h/o CABG s/p CABG in 1998 and had redo bypass again in 2007, repeat cath in 2013 with stenting of the obtuse marginal. Most recent cath 08/10/17 showed severe native 3 vessel CAD with total occlusion of the RCA and LAD, severe stenosis of the native left main and native circumflex. He underwent successful PCI of the left main and LCx using DES platforms. Triple therapy was recommended x 1 month (ASA, Plavix + Eliquis), followed by discontinuation of ASA therafter. He denies CP but has had exertional near syncope and dyspnea, in the setting of recurrent Atrial flutter w/ RVR and anemia, likely from triple therapy. Continue medical therapy for now. He is getting closer to his 1 month mark from stent placement. Will continue ASA for now and will continue to monitor H/H closely. Continue Plavix, BB and statin.   2. Atrial Flutter w/ RVR: pt has prior h/o atrial flutter treated with DCCV in 2016. Pt was in [place] pre cath 08/05/17. Recurrence in the setting of worsening anemia w/  Hgb down at 8.5. Case has been discussed with Dr. Martinique. We will start IV Dilt for rate control (BP is stable) and will plan for TEE cardioversion tomorrow. Update echo once rate is better controlled.   3. Anemia: Drop in Hgb from 10.1 (pre cath) to now 8.5, after recent placement of DES and treatment with triple therapy. Case discussed with Dr. Martinique. We will continue current regimen given recent DES placement and will check FOBT. Will continue home Fe supplementation, started by his PCP. Monitor closely. We will  add Protonix for GI protection.   4. H/o Combined Systolic and Diastolic HF: EF previously 45-50%. Last echo in 2016 showed normal EF at 55%. Volume appears stable. Continue to monitor.   5. HLD: continue statin therapy.   6. Elevated Troponin: POC 0.36 in the ED, likely 2/2 rapid atrial flutter and anemia. He denies CP. We will cycle enzymes x 3 to assess trend.   Severity of Illness: The appropriate patient status for this patient is OBSERVATION. Observation status is judged to be reasonable and necessary in order to provide the required intensity of service to ensure the patient's safety. The patient's presenting symptoms, physical exam findings, and initial radiographic and laboratory data in the context of their medical condition is felt to place them at decreased risk for further clinical deterioration. Furthermore, it is anticipated that the patient will be medically stable for discharge from the hospital within 2 midnights of admission. The following factors support the patient status of observation.   " The patient's presenting symptoms include exertional dyspnea and near syncope. " The physical exam findings include irregular rhythm, tachy rate. " The initial radiographic and laboratory data are abnormal EKG, elevated troponin.     For questions or updates, please contact [place] Please consult [email or web address] for contact info under Cardiology/STEMI.    Signed, Lyda Jester, PA-C  08/30/2017 12:01 PM

## 2017-08-30 NOTE — ED Notes (Signed)
Admit at beside

## 2017-08-30 NOTE — ED Notes (Signed)
Meal tray ordered for pt at this time.

## 2017-08-31 ENCOUNTER — Observation Stay (HOSPITAL_BASED_OUTPATIENT_CLINIC_OR_DEPARTMENT_OTHER): Payer: Medicare HMO

## 2017-08-31 DIAGNOSIS — R7989 Other specified abnormal findings of blood chemistry: Secondary | ICD-10-CM

## 2017-08-31 DIAGNOSIS — I4892 Unspecified atrial flutter: Secondary | ICD-10-CM

## 2017-08-31 DIAGNOSIS — I25709 Atherosclerosis of coronary artery bypass graft(s), unspecified, with unspecified angina pectoris: Secondary | ICD-10-CM

## 2017-08-31 DIAGNOSIS — K2211 Ulcer of esophagus with bleeding: Secondary | ICD-10-CM | POA: Diagnosis not present

## 2017-08-31 DIAGNOSIS — I248 Other forms of acute ischemic heart disease: Secondary | ICD-10-CM

## 2017-08-31 DIAGNOSIS — I25708 Atherosclerosis of coronary artery bypass graft(s), unspecified, with other forms of angina pectoris: Secondary | ICD-10-CM | POA: Diagnosis not present

## 2017-08-31 DIAGNOSIS — D500 Iron deficiency anemia secondary to blood loss (chronic): Secondary | ICD-10-CM | POA: Diagnosis not present

## 2017-08-31 DIAGNOSIS — D509 Iron deficiency anemia, unspecified: Secondary | ICD-10-CM | POA: Diagnosis present

## 2017-08-31 DIAGNOSIS — I5042 Chronic combined systolic (congestive) and diastolic (congestive) heart failure: Secondary | ICD-10-CM | POA: Diagnosis not present

## 2017-08-31 DIAGNOSIS — D582 Other hemoglobinopathies: Secondary | ICD-10-CM | POA: Diagnosis not present

## 2017-08-31 DIAGNOSIS — R778 Other specified abnormalities of plasma proteins: Secondary | ICD-10-CM | POA: Diagnosis present

## 2017-08-31 DIAGNOSIS — R748 Abnormal levels of other serum enzymes: Secondary | ICD-10-CM | POA: Diagnosis not present

## 2017-08-31 LAB — BASIC METABOLIC PANEL
Anion gap: 8 (ref 5–15)
BUN: 19 mg/dL (ref 6–20)
CHLORIDE: 105 mmol/L (ref 101–111)
CO2: 22 mmol/L (ref 22–32)
CREATININE: 1.18 mg/dL (ref 0.61–1.24)
Calcium: 8.6 mg/dL — ABNORMAL LOW (ref 8.9–10.3)
GFR calc Af Amer: >60 mL/min (ref >60)
GFR calc non Af Amer: >60 mL/min (ref >60)
GLUCOSE: 177 mg/dL — AB (ref 65–99)
Potassium: 3.9 mmol/L (ref 3.5–5.1)
SODIUM: 135 mmol/L (ref 135–145)

## 2017-08-31 LAB — CBC
HEMATOCRIT: 26.2 % — AB (ref 39.0–52.0)
Hemoglobin: 7.7 g/dL — ABNORMAL LOW (ref 13.0–17.0)
MCH: 22 pg — AB (ref 26.0–34.0)
MCHC: 29.4 g/dL — AB (ref 30.0–36.0)
MCV: 74.9 fL — AB (ref 78.0–100.0)
PLATELETS: 324 10*3/uL (ref 150–400)
RBC: 3.5 MIL/uL — ABNORMAL LOW (ref 4.22–5.81)
RDW: 17.7 % — AB (ref 11.5–15.5)
WBC: 11.4 10*3/uL — ABNORMAL HIGH (ref 4.0–10.5)

## 2017-08-31 LAB — TROPONIN I: TROPONIN I: 0.43 ng/mL — AB (ref <0.03)

## 2017-08-31 LAB — IRON AND TIBC
Iron: 12 ug/dL — ABNORMAL LOW (ref 45–182)
Saturation Ratios: 3 % — ABNORMAL LOW (ref 17.9–39.5)
TIBC: 405 ug/dL (ref 250–450)
UIBC: 393 ug/dL

## 2017-08-31 LAB — ECHOCARDIOGRAM COMPLETE
Height: 71 in
WEIGHTICAEL: 3584 [oz_av]

## 2017-08-31 LAB — FERRITIN: Ferritin: 8 ng/mL — ABNORMAL LOW (ref 24–336)

## 2017-08-31 MED ORDER — DILTIAZEM HCL ER COATED BEADS 240 MG PO CP24
240.0000 mg | ORAL_CAPSULE | Freq: Every day | ORAL | Status: DC
  Administered 2017-08-31 – 2017-09-01 (×2): 240 mg via ORAL
  Filled 2017-08-31 (×2): qty 1

## 2017-08-31 NOTE — Progress Notes (Signed)
Inpatient Diabetes Program Recommendations  AACE/ADA: New Consensus Statement on Inpatient Glycemic Control (2015)  Target Ranges:  Prepandial:   less than 140 mg/dL      Peak postprandial:   less than 180 mg/dL (1-2 hours)      Critically ill patients:  140 - 180 mg/dL   Lab Results  Component Value Date   GLUCAP 151 (H) 08/11/2017   HGBA1C 9.8 (H) 01/28/2015    Review of Glycemic Control  Diabetes history: DM2 Outpatient Diabetes medications: Lantus 50 units qd + Metformin 1 gm bid Current orders for Inpatient glycemic control: Lantus 50 units + Tradjenta 5 mg qd  Inpatient Diabetes Program Recommendations:   -Glycemic control order set with sensitive tid + hs -A1c to determine prehospital glycemic control  Thank you, Bethena Roys E. Keltie Labell, RN, MSN, CDE  Diabetes Coordinator Inpatient Glycemic Control Team Team Pager [phone] (8am-5pm) 08/31/2017 8:57 AM

## 2017-08-31 NOTE — Progress Notes (Signed)
Progress Note  Patient Name: Justin Blackburn. Date of Encounter: 08/31/2017  Primary Cardiologist: [name], MD   Subjective   Feels well. No chest pain, SOB at rest. States stomach rumbles a lot and he has a lot of gas. Otherwise no GI distress. No stools.   Inpatient Medications    Scheduled Meds: . apixaban  5 mg Oral BID  . atorvastatin  40 mg Oral q1800  . carvedilol  6.25 mg Oral BID  . clopidogrel  75 mg Oral Daily  . ferrous sulfate  325 mg Oral Daily  . furosemide  40 mg Oral Daily  . gabapentin  300 mg Oral BID  . losartan  100 mg Oral Daily   And  . hydrochlorothiazide  12.5 mg Oral Daily  . insulin glargine  50 Units Subcutaneous Daily  . linagliptin  5 mg Oral Daily  . pantoprazole  40 mg Oral Daily   Continuous Infusions: . diltiazem (CARDIZEM) infusion 15 mg/hr (08/31/17 0552)   PRN Meds: acetaminophen, nitroGLYCERIN   Vital Signs    Vitals:   08/30/17 2200 08/30/17 2308 08/31/17 0200 08/31/17 0330  BP: 110/79 (!) 135/96 121/82 114/82  Pulse: (!) 113 (!) 112 73 82  Resp: (!) 25 (!) 25 (!) 23 (!) 26  Temp:  98.3 F (36.8 C)  98.3 F (36.8 C)  TempSrc:  Oral  Oral  SpO2: 96% 99% 91% 96%  Weight:      Height:        Intake/Output Summary (Last 24 hours) at 08/31/2017 0732 Last data filed at 08/31/2017 0200 Gross per 24 hour  Intake 93.33 ml  Output 350 ml  Net -256.67 ml   Filed Weights   08/30/17 0949  Weight: 224 lb (101.6 kg)    Telemetry    Atrial flutter with controlled rate 75.  - Personally Reviewed  ECG    None today - Personally Reviewed  Physical Exam   GEN: No acute distress.   Neck: No JVD Cardiac: IRRR, no murmurs, rubs, or gallops.  Respiratory: Clear to auscultation bilaterally. GI: Soft, nontender, non-distended  MS: No edema; No deformity. Neuro:  Nonfocal  Psych: Normal affect   Labs    Chemistry Recent Labs  Lab 08/30/17 1004 08/31/17 0219  NA 136 135  K 3.6 3.9  CL 105 105  CO2 21* 22   GLUCOSE 229* 177*  BUN 16 19  CREATININE 1.12 1.18  CALCIUM 8.8* 8.6*  PROT 7.0  --   ALBUMIN 3.5  --   AST 20  --   ALT 14*  --   ALKPHOS 57  --   BILITOT 1.1  --   GFRNONAA >60 >60  GFRAA >60 >60  ANIONGAP 10 8     Hematology Recent Labs  Lab 08/30/17 0952 08/31/17 0219  WBC 11.4* 11.4*  RBC 3.85* 3.50*  HGB 8.5* 7.7*  HCT 28.9* 26.2*  MCV 75.1* 74.9*  MCH 22.1* 22.0*  MCHC 29.4* 29.4*  RDW 17.7* 17.7*  PLT 361 324    Cardiac Enzymes Recent Labs  Lab 08/30/17 1314 08/30/17 1843 08/31/17 0014  TROPONINI 0.44* 0.50* 0.43*    Recent Labs  Lab 08/30/17 1027  TROPIPOC 0.36*     BNPNo results for input(s): BNP, PROBNP in the last 168 hours.   DDimer No results for input(s): DDIMER in the last 168 hours.   Radiology    Dg Chest Portable 1 View  Result Date: 08/30/2017 CLINICAL DATA:  Near syncopal episodes since 08/26/2017. EXAM: PORTABLE CHEST 1 VIEW COMPARISON:  PA and lateral chest 01/29/2015. FINDINGS: The patient is status post CABG. There is cardiomegaly without edema. Lungs are clear. No pneumothorax or pleural effusion. No acute bony abnormality. IMPRESSION: Cardiomegaly without acute disease. Electronically Signed   By: Inge Rise M.D.   On: 08/30/2017 10:22    Cardiac Studies   Cardiac cath/PCI 08/10/17: CORONARY STENT INTERVENTION  LEFT HEART CATH AND CORS/GRAFTS ANGIOGRAPHY  Conclusion     Ost LAD lesion is 100% stenosed.  Prox RCA lesion is 100% stenosed.  Ost LM to Dist LM lesion is 75% stenosed.  Prox Cx to Mid Cx lesion is 25% stenosed.  Prox Cx lesion is 70% stenosed.  LIMA graft was visualized by angiography and is normal in caliber.  The graft exhibits no disease.  RIMA graft was visualized by non-selective angiography.  A drug-eluting stent was successfully placed using a STENT SYNERGY DES 3X24.  Post intervention, there is a 0% residual stenosis.  Post intervention, there is a 0% residual stenosis.  A  drug-eluting stent was successfully placed.  Post intervention, there is a 0% residual stenosis.  1. Severe native 3 vessel CAD with total occlusion of the RCA and LAD, severe stenosis of the native left main and native circumflex 2. Successful PCI of the left main and LCx using DES platforms       Patient Profile     67 y.o. male with a history of CAD with recent PCI + DES to the LM and LCx 08/10/17, h/o atrial flutter s/p DCCV in 2016, chronic anticoagulation with Eliquis, HTN, HLD and chronic systolic HF, presenting back to the ED with complaint of near syncope and exertional dyspnea. EKG shows rapid atrial flutter in the 140s. POC troponin elevated at 0.36.     Assessment & Plan    1. Atrial flutter with RVR. New onset. Rate now controlled on IV diltiazem and Coreg. On Eliquis for anticoagulation. Initially planned for DCCV today but no spots on schedule. Now I am concerned about continued drop in Hgb and the possible need to interrupt anticoagulation therapy. Will postpone DCCV until this is sorted out. Will switch IV diltiazem to po.  2. Progressive anemia. Hgb pre cath was 10.1. Post cath dropped to 9.3. Now 8.5 dropping to 7.7. Added PPI. Will heme check stool. Stop ASA. Will need to continue plavix given recent stent. Will ask GI to evaluate. May need to at least consider Upper EGD. Will keep NPO for now.  3. CAD: h/o CABG s/p CABG in 1998 and had redo bypass again in 2007, repeat cath in 2013 withstenting of the obtuse marginal. Most recent cath 08/10/17 showed severe native 3 vessel CAD with total occlusion of the RCA and LAD, severe stenosis of the native left main and native circumflex. He underwent successful PCI of the left main and LCx using DES platforms. Triple therapy was recommended x 1 month (ASA, Plavix + Eliquis), followed by discontinuation of ASA thereafter. Will now stop ASA. Mild troponin leak with flat trend most consistent with demand ischemia related to atrial  flutter and anemia.  4. H/o Combined Systolic and Diastolic HF: EF previously 45-50%. Last echo in 2016 showed normal EF at 55%. Volume appears stable. Will check Echo now.  5. HLD on statin therapy.     For questions or updates, please contact [place] Please consult [email or web address] for contact info under Cardiology/STEMI.      Signed, Axxel Gude Martinique,  MD,FACC  08/31/2017, 7:32 AM

## 2017-08-31 NOTE — H&P (View-Only) (Signed)
Referring Provider:  Dr. Martinique Primary Care Physician:  Practice, [facility] Family Primary Gastroenterologist:  unassigned  Reason for Consultation:  Significant drop in hemoglobin, anemia  HPI: Justin Blackburn. is a 67 y.o. male . Past medical history of coronary artery disease with recent PCI with the drug-eluting stent placement on 08/10/2017, history of atrial flutter currently on Eliquis admitted to the hospital with near syncope and worsening dyspnea on exertion. Patient was found to be in atrial flutter with RVR.  Patient seen and examined at bedside. denies any black stool or bright red blood per rectum.No bowel movement in last 2 days.denies any abdominal pain, nausea or vomiting. Denied dysphagia or odynophagia. Complaining of intermittent abdominal bloating.denied any recent significant NSAID use.  No previous EGD or colonoscopy. No family history of colon cancer.  Past Medical History:  Diagnosis Date  . Angina   . Arthritis    "hands" (08/10/2017)  . Atrial flutter, paroxysmal ([place])    arrived with aflutter with RVR, EF down to 25-30%, s/p TEE DCCV on 01/29/2015  . Chronic lower back pain    "all the time from my sciatic nerve"  . Erectile dysfunction   . Heart failure ([place])   . Hypercholesteremia   . Hypertension   . 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 Myocardial infarction ([place]) 1988; ~7035;  ~1996;~ 2000  . Obesity   . Peripheral vascular disease ([place])   . Umbilical hernia    unrepaired    Past Surgical History:  Procedure Laterality Date  . CARDIOVERSION N/A 01/29/2015   Procedure: CARDIOVERSION;  Surgeon: [name], MD;  Location: [place];  Service: Cardiovascular;  Laterality: N/A;  . CORONARY ANGIOPLASTY WITH STENT PLACEMENT  08/31/11   "2 today; makes total of 3 stents"  . CORONARY ANGIOPLASTY WITH STENT PLACEMENT  08/10/2017   "2 today; makes a total of 5 stents" (08/10/2017)  . CORONARY ARTERY BYPASS GRAFT  1998;  2007   CABG X 4; CABG X4  . CORONARY STENT INTERVENTION N/A 08/10/2017   Procedure: CORONARY STENT INTERVENTION;  Surgeon: Sherren Mocha, MD;  Location: [place] CV LAB;  Service: Cardiovascular;  Laterality: N/A;  . LEFT HEART CATH AND CORS/GRAFTS ANGIOGRAPHY N/A 08/10/2017   Procedure: LEFT HEART CATH AND CORS/GRAFTS ANGIOGRAPHY;  Surgeon: Sherren Mocha, MD;  Location: [place] CV LAB;  Service: Cardiovascular;  Laterality: N/A;  . LEFT HEART CATHETERIZATION WITH CORONARY/GRAFT ANGIOGRAM N/A 08/31/2011   Procedure: LEFT HEART CATHETERIZATION WITH Beatrix Fetters;  Surgeon: [name], MD;  Location: [facility] CATH LAB;  Service: Cardiovascular;  Laterality: N/A;  . PERIPHERAL VASCULAR CATHETERIZATION N/A 04/27/2016   Procedure: Abdominal Aortogram w/Lower Extremity;  Surgeon: Angelia Mould, MD;  Location: [place] CV LAB;  Service: Cardiovascular;  Laterality: N/A;  . TEE WITHOUT CARDIOVERSION N/A 01/29/2015   Procedure: TRANSESOPHAGEAL ECHOCARDIOGRAM (TEE);  Surgeon: [name], MD;  Location: [facility] ENDOSCOPY;  Service: Cardiovascular;  Laterality: N/A;    Prior to Admission medications   Medication Sig Start Date End Date Taking? Authorizing Provider  atorvastatin (LIPITOR) 40 MG tablet Take 40 mg by mouth daily at 6 PM.  07/14/17  Yes [provider]  carvedilol (COREG) 6.25 MG tablet Take 6.25 mg by mouth 2 (two) times daily.   Yes [provider]  clopidogrel (PLAVIX) 75 MG tablet Take 1 tablet (75 mg total) by mouth daily. Take for 6 months. Plan to stop on August 27th. Check with cardiologist at that time.  08/11/17  Yes Daune Perch, NP  ELIQUIS 5 MG TABS tablet TAKE 1 TABLET BY MOUTH TWICE A DAY 07/14/17  Yes Belva Crome, MD  ferrous sulfate 325 (65 FE) MG tablet Take 325 mg by mouth daily. 07/20/17  Yes [provider]  furosemide (LASIX) 40 MG tablet Take 1 tablet (40 mg total) by mouth daily. 09/07/16  Yes Belva Crome, MD   gabapentin (NEURONTIN) 300 MG capsule Take 300 mg by mouth 2 (two) times daily. 07/09/17  Yes [provider]  LANTUS SOLOSTAR 100 UNIT/ML Solostar Pen Inject 50 Units into the skin daily.  12/07/14  Yes [provider]  losartan-hydrochlorothiazide (HYZAAR) 100-12.5 MG tablet TAKE 1 (ONE) TABLET BY MOUTH DAILY FOR HIGH BP 07/14/16  Yes [provider]  metFORMIN (GLUCOPHAGE) 1000 MG tablet Take 1,000 mg by mouth 2 (two) times daily. 04/12/14  Yes [provider]  sitaGLIPtin (JANUVIA) 100 MG tablet Take 100 mg by mouth daily.   Yes [provider]  feeding supplement, ENSURE ENLIVE, (ENSURE ENLIVE) LIQD Take 237 mLs by mouth 2 (two) times daily between meals. Patient not taking: Reported on 08/30/2017 08/11/17   Daune Perch, NP  nitroGLYCERIN (NITROSTAT) 0.4 MG SL tablet Place 1 tablet (0.4 mg total) under the tongue every 5 (five) minutes as needed for chest pain (Angina). 06/18/14 08/09/18  Belva Crome, MD    Scheduled Meds: . apixaban  5 mg Oral BID  . atorvastatin  40 mg Oral q1800  . carvedilol  6.25 mg Oral BID  . clopidogrel  75 mg Oral Daily  . diltiazem  240 mg Oral Daily  . ferrous sulfate  325 mg Oral Daily  . furosemide  40 mg Oral Daily  . gabapentin  300 mg Oral BID  . losartan  100 mg Oral Daily   And  . hydrochlorothiazide  12.5 mg Oral Daily  . insulin glargine  50 Units Subcutaneous Daily  . linagliptin  5 mg Oral Daily  . pantoprazole  40 mg Oral Daily   Continuous Infusions: PRN Meds:.acetaminophen, nitroGLYCERIN  Allergies as of 08/30/2017  . (No Known Allergies)    Family History  Problem Relation Age of Onset  . Heart disease Mother   . Heart attack Mother   . Heart disease Father   . Heart attack Father   . Heart attack Brother   . Stroke Neg Hx     Social History   Socioeconomic History  . Marital status: Married    Spouse name: Not on file  . Number of children: Not on file  . Years of education:  Not on file  . Highest education level: Not on file  Social Needs  . Financial resource strain: Not on file  . Food insecurity - worry: Not on file  . Food insecurity - inability: Not on file  . Transportation needs - medical: Not on file  . Transportation needs - non-medical: Not on file  Occupational History  . Not on file  Tobacco Use  . Smoking status: Former Smoker    Packs/day: 2.00    Years: 6.00    Pack years: 12.00    Types: Cigarettes    Last attempt to quit: 06/16/1971    Years since quitting: 46.2  . Smokeless tobacco: Former Systems developer    Quit date: 06/15/1986  . Tobacco comment: "chewed on cigars; never really did chew tobacco"  Substance and Sexual Activity  . Alcohol use: No  . Drug use: No  .  Sexual activity: No  Other Topics Concern  . Not on file  Social History Narrative  . Not on file    Review of Systems:  Review of Systems  Constitutional: Negative for chills and fever.  HENT: Negative for hearing loss and tinnitus.   Eyes: Negative for blurred vision and double vision.  Respiratory: Negative for cough, hemoptysis and sputum production.   Cardiovascular: Positive for palpitations. Negative for chest pain.  Gastrointestinal: Positive for constipation. Negative for abdominal pain, blood in stool, diarrhea, heartburn, melena, nausea and vomiting.  Genitourinary: Negative for dysuria and urgency.  Musculoskeletal: Negative for myalgias and neck pain.  Skin: Negative for itching and rash.  Neurological: Positive for dizziness, loss of consciousness and weakness. Negative for seizures.  Endo/Heme/Allergies: Does not bruise/bleed easily.  Psychiatric/Behavioral: Negative for hallucinations and suicidal ideas.    Physical Exam: Vital signs: Vitals:   08/31/17 0330 08/31/17 0700  BP: 114/82 115/73  Pulse: 82 72  Resp: (!) 26 (!) 25  Temp: 98.3 F (36.8 C) 98.1 F (36.7 C)  SpO2: 96% 95%   Physical Exam  Constitutional: He is oriented to person, place,  and time. He appears well-developed and well-nourished. No distress.  HENT:  Head: Normocephalic and atraumatic.  Mouth/Throat: Oropharynx is clear and moist. No oropharyngeal exudate.  Poor dentition  Eyes: EOM are normal.  Neck: Normal range of motion. Neck supple.  Cardiovascular: Exam reveals no friction rub.  No murmur heard. IRRR,   Pulmonary/Chest: Effort normal and breath sounds normal. No respiratory distress.  Abdominal: Soft. Bowel sounds are normal. He exhibits no distension. There is no tenderness. There is no rebound and no guarding.  Small reducible umbilical hernia  Musculoskeletal: Normal range of motion. He exhibits no edema.  Neurological: He is alert and oriented to person, place, and time.  Skin: Skin is warm. No erythema.  Psychiatric: He has a normal mood and affect. Judgment and thought content normal.  Vitals reviewed.   GI:  Lab Results: Recent Labs    08/30/17 0952 08/31/17 0219  WBC 11.4* 11.4*  HGB 8.5* 7.7*  HCT 28.9* 26.2*  PLT 361 324   BMET Recent Labs    08/30/17 1004 08/31/17 0219  NA 136 135  K 3.6 3.9  CL 105 105  CO2 21* 22  GLUCOSE 229* 177*  BUN 16 19  CREATININE 1.12 1.18  CALCIUM 8.8* 8.6*   LFT Recent Labs    08/30/17 1004  PROT 7.0  ALBUMIN 3.5  AST 20  ALT 14*  ALKPHOS 57  BILITOT 1.1   PT/INR No results for input(s): LABPROT, INR in the last 72 hours.   Studies/Results: Dg Chest Portable 1 View  Result Date: 08/30/2017 CLINICAL DATA:  Near syncopal episodes since 08/26/2017. EXAM: PORTABLE CHEST 1 VIEW COMPARISON:  PA and lateral chest 01/29/2015. FINDINGS: The patient is status post CABG. There is cardiomegaly without edema. Lungs are clear. No pneumothorax or pleural effusion. No acute bony abnormality. IMPRESSION: Cardiomegaly without acute disease. Electronically Signed   By: Inge Rise M.D.   On: 08/30/2017 10:22    Impression/Plan: - anemia with significant drop in hemoglobin.hemoglobin was  14.3 in November 2017. Hemoglobin was 10.1 in febrile 2019. Hemoglobin today 7.7. Low MCV. No evidence of overt bleeding. Denied black stool or bright red blood per rectum. -  Atrial flutter.  rate controlled now . Last dose of Eliquis this morning  - history of coronary artery disease status post PCI in 07/10/2017. On aspirin and  Plavix.  Recommendations ------------------------- - Patient has no overt bleeding but has significant drop in hemoglobin. - we'll get iron studies. Stool for occult blood. - discussed with Dr. Martinique. Okay to hold Eliquis. His aspirin has been discontinued. - Discuss with the pharmacist. ok to do endoscopic procedure after holding Eliquis for 24 hours. - plan for EGD tomorrow. If negative, he may need colonoscopy. - keep nothing by mouth past midnight. GI will follow   LOS: 0 days   Otis Brace  MD, FACP 08/31/2017, 10:45 AM  Contact #  [phone]

## 2017-08-31 NOTE — Progress Notes (Signed)
  Echocardiogram 2D Echocardiogram has been performed.  Justin Blackburn 08/31/2017, 11:25 AM

## 2017-08-31 NOTE — Consult Note (Signed)
Referring Provider:  Dr. Martinique Primary Care Physician:  Practice, [facility] Family Primary Gastroenterologist:  unassigned  Reason for Consultation:  Significant drop in hemoglobin, anemia  HPI: Justin Blackburn. is a 67 y.o. male . Past medical history of coronary artery disease with recent PCI with the drug-eluting stent placement on 08/10/2017, history of atrial flutter currently on Eliquis admitted to the hospital with near syncope and worsening dyspnea on exertion. Patient was found to be in atrial flutter with RVR.  Patient seen and examined at bedside. denies any black stool or bright red blood per rectum.No bowel movement in last 2 days.denies any abdominal pain, nausea or vomiting. Denied dysphagia or odynophagia. Complaining of intermittent abdominal bloating.denied any recent significant NSAID use.  No previous EGD or colonoscopy. No family history of colon cancer.  Past Medical History:  Diagnosis Date  . Angina   . Arthritis    "hands" (08/10/2017)  . Atrial flutter, paroxysmal ([place])    arrived with aflutter with RVR, EF down to 25-30%, s/p TEE DCCV on 01/29/2015  . Chronic lower back pain    "all the time from my sciatic nerve"  . Erectile dysfunction   . Heart failure ([place])   . Hypercholesteremia   . Hypertension   . 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 Myocardial infarction ([place]) 1988; ~6314;  ~1996;~ 2000  . Obesity   . Peripheral vascular disease ([place])   . Umbilical hernia    unrepaired    Past Surgical History:  Procedure Laterality Date  . CARDIOVERSION N/A 01/29/2015   Procedure: CARDIOVERSION;  Surgeon: [name], MD;  Location: [place];  Service: Cardiovascular;  Laterality: N/A;  . CORONARY ANGIOPLASTY WITH STENT PLACEMENT  08/31/11   "2 today; makes total of 3 stents"  . CORONARY ANGIOPLASTY WITH STENT PLACEMENT  08/10/2017   "2 today; makes a total of 5 stents" (08/10/2017)  . CORONARY ARTERY BYPASS GRAFT  1998;  2007   CABG X 4; CABG X4  . CORONARY STENT INTERVENTION N/A 08/10/2017   Procedure: CORONARY STENT INTERVENTION;  Surgeon: Sherren Mocha, MD;  Location: [place] CV LAB;  Service: Cardiovascular;  Laterality: N/A;  . LEFT HEART CATH AND CORS/GRAFTS ANGIOGRAPHY N/A 08/10/2017   Procedure: LEFT HEART CATH AND CORS/GRAFTS ANGIOGRAPHY;  Surgeon: Sherren Mocha, MD;  Location: [place] CV LAB;  Service: Cardiovascular;  Laterality: N/A;  . LEFT HEART CATHETERIZATION WITH CORONARY/GRAFT ANGIOGRAM N/A 08/31/2011   Procedure: LEFT HEART CATHETERIZATION WITH Beatrix Fetters;  Surgeon: [name], MD;  Location: [facility] CATH LAB;  Service: Cardiovascular;  Laterality: N/A;  . PERIPHERAL VASCULAR CATHETERIZATION N/A 04/27/2016   Procedure: Abdominal Aortogram w/Lower Extremity;  Surgeon: Angelia Mould, MD;  Location: [place] CV LAB;  Service: Cardiovascular;  Laterality: N/A;  . TEE WITHOUT CARDIOVERSION N/A 01/29/2015   Procedure: TRANSESOPHAGEAL ECHOCARDIOGRAM (TEE);  Surgeon: [name], MD;  Location: [facility] ENDOSCOPY;  Service: Cardiovascular;  Laterality: N/A;    Prior to Admission medications   Medication Sig Start Date End Date Taking? Authorizing Provider  atorvastatin (LIPITOR) 40 MG tablet Take 40 mg by mouth daily at 6 PM.  07/14/17  Yes [provider]  carvedilol (COREG) 6.25 MG tablet Take 6.25 mg by mouth 2 (two) times daily.   Yes [provider]  clopidogrel (PLAVIX) 75 MG tablet Take 1 tablet (75 mg total) by mouth daily. Take for 6 months. Plan to stop on August 27th. Check with cardiologist at that time.  08/11/17  Yes Daune Perch, NP  ELIQUIS 5 MG TABS tablet TAKE 1 TABLET BY MOUTH TWICE A DAY 07/14/17  Yes Belva Crome, MD  ferrous sulfate 325 (65 FE) MG tablet Take 325 mg by mouth daily. 07/20/17  Yes [provider]  furosemide (LASIX) 40 MG tablet Take 1 tablet (40 mg total) by mouth daily. 09/07/16  Yes Belva Crome, MD   gabapentin (NEURONTIN) 300 MG capsule Take 300 mg by mouth 2 (two) times daily. 07/09/17  Yes [provider]  LANTUS SOLOSTAR 100 UNIT/ML Solostar Pen Inject 50 Units into the skin daily.  12/07/14  Yes [provider]  losartan-hydrochlorothiazide (HYZAAR) 100-12.5 MG tablet TAKE 1 (ONE) TABLET BY MOUTH DAILY FOR HIGH BP 07/14/16  Yes [provider]  metFORMIN (GLUCOPHAGE) 1000 MG tablet Take 1,000 mg by mouth 2 (two) times daily. 04/12/14  Yes [provider]  sitaGLIPtin (JANUVIA) 100 MG tablet Take 100 mg by mouth daily.   Yes [provider]  feeding supplement, ENSURE ENLIVE, (ENSURE ENLIVE) LIQD Take 237 mLs by mouth 2 (two) times daily between meals. Patient not taking: Reported on 08/30/2017 08/11/17   Daune Perch, NP  nitroGLYCERIN (NITROSTAT) 0.4 MG SL tablet Place 1 tablet (0.4 mg total) under the tongue every 5 (five) minutes as needed for chest pain (Angina). 06/18/14 08/09/18  Belva Crome, MD    Scheduled Meds: . apixaban  5 mg Oral BID  . atorvastatin  40 mg Oral q1800  . carvedilol  6.25 mg Oral BID  . clopidogrel  75 mg Oral Daily  . diltiazem  240 mg Oral Daily  . ferrous sulfate  325 mg Oral Daily  . furosemide  40 mg Oral Daily  . gabapentin  300 mg Oral BID  . losartan  100 mg Oral Daily   And  . hydrochlorothiazide  12.5 mg Oral Daily  . insulin glargine  50 Units Subcutaneous Daily  . linagliptin  5 mg Oral Daily  . pantoprazole  40 mg Oral Daily   Continuous Infusions: PRN Meds:.acetaminophen, nitroGLYCERIN  Allergies as of 08/30/2017  . (No Known Allergies)    Family History  Problem Relation Age of Onset  . Heart disease Mother   . Heart attack Mother   . Heart disease Father   . Heart attack Father   . Heart attack Brother   . Stroke Neg Hx     Social History   Socioeconomic History  . Marital status: Married    Spouse name: Not on file  . Number of children: Not on file  . Years of education:  Not on file  . Highest education level: Not on file  Social Needs  . Financial resource strain: Not on file  . Food insecurity - worry: Not on file  . Food insecurity - inability: Not on file  . Transportation needs - medical: Not on file  . Transportation needs - non-medical: Not on file  Occupational History  . Not on file  Tobacco Use  . Smoking status: Former Smoker    Packs/day: 2.00    Years: 6.00    Pack years: 12.00    Types: Cigarettes    Last attempt to quit: 06/16/1971    Years since quitting: 46.2  . Smokeless tobacco: Former Systems developer    Quit date: 06/15/1986  . Tobacco comment: "chewed on cigars; never really did chew tobacco"  Substance and Sexual Activity  . Alcohol use: No  . Drug use: No  .  Sexual activity: No  Other Topics Concern  . Not on file  Social History Narrative  . Not on file    Review of Systems:  Review of Systems  Constitutional: Negative for chills and fever.  HENT: Negative for hearing loss and tinnitus.   Eyes: Negative for blurred vision and double vision.  Respiratory: Negative for cough, hemoptysis and sputum production.   Cardiovascular: Positive for palpitations. Negative for chest pain.  Gastrointestinal: Positive for constipation. Negative for abdominal pain, blood in stool, diarrhea, heartburn, melena, nausea and vomiting.  Genitourinary: Negative for dysuria and urgency.  Musculoskeletal: Negative for myalgias and neck pain.  Skin: Negative for itching and rash.  Neurological: Positive for dizziness, loss of consciousness and weakness. Negative for seizures.  Endo/Heme/Allergies: Does not bruise/bleed easily.  Psychiatric/Behavioral: Negative for hallucinations and suicidal ideas.    Physical Exam: Vital signs: Vitals:   08/31/17 0330 08/31/17 0700  BP: 114/82 115/73  Pulse: 82 72  Resp: (!) 26 (!) 25  Temp: 98.3 F (36.8 C) 98.1 F (36.7 C)  SpO2: 96% 95%   Physical Exam  Constitutional: He is oriented to person, place,  and time. He appears well-developed and well-nourished. No distress.  HENT:  Head: Normocephalic and atraumatic.  Mouth/Throat: Oropharynx is clear and moist. No oropharyngeal exudate.  Poor dentition  Eyes: EOM are normal.  Neck: Normal range of motion. Neck supple.  Cardiovascular: Exam reveals no friction rub.  No murmur heard. IRRR,   Pulmonary/Chest: Effort normal and breath sounds normal. No respiratory distress.  Abdominal: Soft. Bowel sounds are normal. He exhibits no distension. There is no tenderness. There is no rebound and no guarding.  Small reducible umbilical hernia  Musculoskeletal: Normal range of motion. He exhibits no edema.  Neurological: He is alert and oriented to person, place, and time.  Skin: Skin is warm. No erythema.  Psychiatric: He has a normal mood and affect. Judgment and thought content normal.  Vitals reviewed.   GI:  Lab Results: Recent Labs    08/30/17 0952 08/31/17 0219  WBC 11.4* 11.4*  HGB 8.5* 7.7*  HCT 28.9* 26.2*  PLT 361 324   BMET Recent Labs    08/30/17 1004 08/31/17 0219  NA 136 135  K 3.6 3.9  CL 105 105  CO2 21* 22  GLUCOSE 229* 177*  BUN 16 19  CREATININE 1.12 1.18  CALCIUM 8.8* 8.6*   LFT Recent Labs    08/30/17 1004  PROT 7.0  ALBUMIN 3.5  AST 20  ALT 14*  ALKPHOS 57  BILITOT 1.1   PT/INR No results for input(s): LABPROT, INR in the last 72 hours.   Studies/Results: Dg Chest Portable 1 View  Result Date: 08/30/2017 CLINICAL DATA:  Near syncopal episodes since 08/26/2017. EXAM: PORTABLE CHEST 1 VIEW COMPARISON:  PA and lateral chest 01/29/2015. FINDINGS: The patient is status post CABG. There is cardiomegaly without edema. Lungs are clear. No pneumothorax or pleural effusion. No acute bony abnormality. IMPRESSION: Cardiomegaly without acute disease. Electronically Signed   By: Inge Rise M.D.   On: 08/30/2017 10:22    Impression/Plan: - anemia with significant drop in hemoglobin.hemoglobin was  14.3 in November 2017. Hemoglobin was 10.1 in febrile 2019. Hemoglobin today 7.7. Low MCV. No evidence of overt bleeding. Denied black stool or bright red blood per rectum. -  Atrial flutter.  rate controlled now . Last dose of Eliquis this morning  - history of coronary artery disease status post PCI in 07/10/2017. On aspirin and  Plavix.  Recommendations ------------------------- - Patient has no overt bleeding but has significant drop in hemoglobin. - we'll get iron studies. Stool for occult blood. - discussed with Dr. Martinique. Okay to hold Eliquis. His aspirin has been discontinued. - Discuss with the pharmacist. ok to do endoscopic procedure after holding Eliquis for 24 hours. - plan for EGD tomorrow. If negative, he may need colonoscopy. - keep nothing by mouth past midnight. GI will follow   LOS: 0 days   Otis Brace  MD, FACP 08/31/2017, 10:45 AM  Contact #  [phone]

## 2017-09-01 ENCOUNTER — Observation Stay (HOSPITAL_COMMUNITY): Payer: Medicare HMO | Admitting: Certified Registered"

## 2017-09-01 ENCOUNTER — Encounter (HOSPITAL_COMMUNITY)
Admission: EM | Disposition: A | Discharge status: Home / Self Care | Payer: Self-pay | Source: Home / Self Care | Attending: Cardiology

## 2017-09-01 ENCOUNTER — Encounter (HOSPITAL_COMMUNITY): Payer: Self-pay | Admitting: *Deleted

## 2017-09-01 DIAGNOSIS — N289 Disorder of kidney and ureter, unspecified: Secondary | ICD-10-CM | POA: Diagnosis not present

## 2017-09-01 DIAGNOSIS — Z6831 Body mass index (BMI) 31.0-31.9, adult: Secondary | ICD-10-CM | POA: Diagnosis not present

## 2017-09-01 DIAGNOSIS — C155 Malignant neoplasm of lower third of esophagus: Secondary | ICD-10-CM | POA: Diagnosis not present

## 2017-09-01 DIAGNOSIS — I252 Old myocardial infarction: Secondary | ICD-10-CM | POA: Diagnosis not present

## 2017-09-01 DIAGNOSIS — E1151 Type 2 diabetes mellitus with diabetic peripheral angiopathy without gangrene: Secondary | ICD-10-CM | POA: Diagnosis present

## 2017-09-01 DIAGNOSIS — I10 Essential (primary) hypertension: Secondary | ICD-10-CM | POA: Diagnosis not present

## 2017-09-01 DIAGNOSIS — Z7902 Long term (current) use of antithrombotics/antiplatelets: Secondary | ICD-10-CM | POA: Diagnosis not present

## 2017-09-01 DIAGNOSIS — I5042 Chronic combined systolic (congestive) and diastolic (congestive) heart failure: Secondary | ICD-10-CM

## 2017-09-01 DIAGNOSIS — R195 Other fecal abnormalities: Secondary | ICD-10-CM | POA: Diagnosis not present

## 2017-09-01 DIAGNOSIS — M19042 Primary osteoarthritis, left hand: Secondary | ICD-10-CM | POA: Diagnosis not present

## 2017-09-01 DIAGNOSIS — E785 Hyperlipidemia, unspecified: Secondary | ICD-10-CM | POA: Diagnosis present

## 2017-09-01 DIAGNOSIS — D500 Iron deficiency anemia secondary to blood loss (chronic): Secondary | ICD-10-CM | POA: Diagnosis not present

## 2017-09-01 DIAGNOSIS — C160 Malignant neoplasm of cardia: Secondary | ICD-10-CM | POA: Diagnosis not present

## 2017-09-01 DIAGNOSIS — I248 Other forms of acute ischemic heart disease: Secondary | ICD-10-CM | POA: Diagnosis not present

## 2017-09-01 DIAGNOSIS — I25709 Atherosclerosis of coronary artery bypass graft(s), unspecified, with unspecified angina pectoris: Secondary | ICD-10-CM | POA: Diagnosis not present

## 2017-09-01 DIAGNOSIS — Z7984 Long term (current) use of oral hypoglycemic drugs: Secondary | ICD-10-CM | POA: Diagnosis not present

## 2017-09-01 DIAGNOSIS — R748 Abnormal levels of other serum enzymes: Secondary | ICD-10-CM | POA: Diagnosis not present

## 2017-09-01 DIAGNOSIS — Z87891 Personal history of nicotine dependence: Secondary | ICD-10-CM | POA: Diagnosis not present

## 2017-09-01 DIAGNOSIS — I110 Hypertensive heart disease with heart failure: Secondary | ICD-10-CM | POA: Diagnosis not present

## 2017-09-01 DIAGNOSIS — K2211 Ulcer of esophagus with bleeding: Secondary | ICD-10-CM | POA: Diagnosis not present

## 2017-09-01 DIAGNOSIS — D649 Anemia, unspecified: Secondary | ICD-10-CM | POA: Diagnosis not present

## 2017-09-01 DIAGNOSIS — Z955 Presence of coronary angioplasty implant and graft: Secondary | ICD-10-CM | POA: Diagnosis not present

## 2017-09-01 DIAGNOSIS — C799 Secondary malignant neoplasm of unspecified site: Secondary | ICD-10-CM | POA: Diagnosis not present

## 2017-09-01 DIAGNOSIS — E669 Obesity, unspecified: Secondary | ICD-10-CM | POA: Diagnosis present

## 2017-09-01 DIAGNOSIS — N179 Acute kidney failure, unspecified: Secondary | ICD-10-CM | POA: Diagnosis not present

## 2017-09-01 DIAGNOSIS — Z951 Presence of aortocoronary bypass graft: Secondary | ICD-10-CM | POA: Diagnosis not present

## 2017-09-01 DIAGNOSIS — E7800 Pure hypercholesterolemia, unspecified: Secondary | ICD-10-CM | POA: Diagnosis not present

## 2017-09-01 DIAGNOSIS — I2510 Atherosclerotic heart disease of native coronary artery without angina pectoris: Secondary | ICD-10-CM | POA: Diagnosis not present

## 2017-09-01 DIAGNOSIS — R55 Syncope and collapse: Secondary | ICD-10-CM | POA: Diagnosis present

## 2017-09-01 DIAGNOSIS — M19041 Primary osteoarthritis, right hand: Secondary | ICD-10-CM | POA: Diagnosis not present

## 2017-09-01 DIAGNOSIS — C159 Malignant neoplasm of esophagus, unspecified: Secondary | ICD-10-CM | POA: Diagnosis not present

## 2017-09-01 DIAGNOSIS — Z7901 Long term (current) use of anticoagulants: Secondary | ICD-10-CM | POA: Diagnosis not present

## 2017-09-01 DIAGNOSIS — M545 Low back pain: Secondary | ICD-10-CM | POA: Diagnosis not present

## 2017-09-01 DIAGNOSIS — Z8249 Family history of ischemic heart disease and other diseases of the circulatory system: Secondary | ICD-10-CM | POA: Diagnosis not present

## 2017-09-01 DIAGNOSIS — I4892 Unspecified atrial flutter: Secondary | ICD-10-CM | POA: Insufficient documentation

## 2017-09-01 DIAGNOSIS — D509 Iron deficiency anemia, unspecified: Secondary | ICD-10-CM | POA: Diagnosis not present

## 2017-09-01 DIAGNOSIS — G8929 Other chronic pain: Secondary | ICD-10-CM | POA: Diagnosis not present

## 2017-09-01 HISTORY — PX: ESOPHAGOGASTRODUODENOSCOPY (EGD) WITH PROPOFOL: SHX5813

## 2017-09-01 LAB — GLUCOSE, CAPILLARY: GLUCOSE-CAPILLARY: 73 mg/dL (ref 65–99)

## 2017-09-01 LAB — CBC
HCT: 26.9 % — ABNORMAL LOW (ref 39.0–52.0)
Hemoglobin: 7.9 g/dL — ABNORMAL LOW (ref 13.0–17.0)
MCH: 21.6 pg — AB (ref 26.0–34.0)
MCHC: 29.4 g/dL — ABNORMAL LOW (ref 30.0–36.0)
MCV: 73.5 fL — ABNORMAL LOW (ref 78.0–100.0)
Platelets: 339 10*3/uL (ref 150–400)
RBC: 3.66 MIL/uL — AB (ref 4.22–5.81)
RDW: 17.1 % — ABNORMAL HIGH (ref 11.5–15.5)
WBC: 11.1 10*3/uL — AB (ref 4.0–10.5)

## 2017-09-01 SURGERY — ESOPHAGOGASTRODUODENOSCOPY (EGD) WITH PROPOFOL
Anesthesia: Monitor Anesthesia Care

## 2017-09-01 MED ORDER — BUTAMBEN-TETRACAINE-BENZOCAINE 2-2-14 % EX AERO
INHALATION_SPRAY | CUTANEOUS | Status: DC | PRN
  Administered 2017-09-01: 1 via TOPICAL

## 2017-09-01 MED ORDER — SUCRALFATE 1 GM/10ML PO SUSP
1.0000 g | Freq: Three times a day (TID) | ORAL | Status: DC
  Administered 2017-09-01 – 2017-09-04 (×13): 1 g via ORAL
  Filled 2017-09-01 (×13): qty 10

## 2017-09-01 MED ORDER — PROPOFOL 500 MG/50ML IV EMUL
INTRAVENOUS | Status: DC | PRN
  Administered 2017-09-01: 100 ug/kg/min via INTRAVENOUS

## 2017-09-01 MED ORDER — SODIUM CHLORIDE 0.9 % IV SOLN: INTRAVENOUS | Status: DC

## 2017-09-01 MED ORDER — PROPOFOL 10 MG/ML IV BOLUS
INTRAVENOUS | Status: DC | PRN
  Administered 2017-09-01: 40 mg via INTRAVENOUS

## 2017-09-01 MED ORDER — SODIUM CHLORIDE 0.9 % IV SOLN
510.0000 mg | INTRAVENOUS | Status: DC
  Administered 2017-09-01: 510 mg via INTRAVENOUS
  Filled 2017-09-01: qty 17

## 2017-09-01 MED ORDER — ONDANSETRON HCL 4 MG/2ML IJ SOLN
INTRAMUSCULAR | Status: DC | PRN
  Administered 2017-09-01: 4 mg via INTRAVENOUS

## 2017-09-01 MED ORDER — LACTATED RINGERS IV SOLN
INTRAVENOUS | Status: DC
  Administered 2017-09-01: 1000 mL via INTRAVENOUS

## 2017-09-01 SURGICAL SUPPLY — 15 items

## 2017-09-01 NOTE — Progress Notes (Signed)
Inpatient Diabetes Program Recommendations  AACE/ADA: New Consensus Statement on Inpatient Glycemic Control (2015)  Target Ranges:  Prepandial:   less than 140 mg/dL      Peak postprandial:   less than 180 mg/dL (1-2 hours)      Critically ill patients:  140 - 180 mg/dL  Results for Justin Blackburn, Justin Blackburn (MRN [id number]) as of 09/01/2017 11:57  Ref. Range 09/01/2017 10:06  Glucose-Capillary Latest Ref Range: 65 - 99 mg/dL 73   Results for Justin Blackburn, Justin Blackburn (MRN [id number]) as of 09/01/2017 11:57  Ref. Range 08/30/2017 10:04 08/31/2017 02:19  Glucose Latest Ref Range: 65 - 99 mg/dL 229 (H) 177 (H)   Review of Glycemic Control  Diabetes history: DM2 Outpatient Diabetes medications: Lantus 50 units daily, Metformin 1000 mg BID, Januvia 100 mg daily Current orders for Inpatient glycemic control: Lantus 50 units daily, Tradjenta 5 mg daily  Inpatient Diabetes Program Recommendations:  Correction (SSI): While inpatient, please consider ordering CBGs with Novolog 0-9 units TID with meals and Novolog 0-5 units QHS. HgbA1C: Please consider ordering an A1C to evaluate glycemic control over the past 2-3 months.  NOTE: Patient is receiving insulin and oral DM medications and glucose is NOT being consistently monitored as an inpatient. Please order CBGs with Novolog correction scale and an A1C.   Thanks, Barnie Alderman, RN, MSN, CDE Diabetes Coordinator Inpatient Diabetes Program [phone] (Team Pager from 8am to 5pm)

## 2017-09-01 NOTE — Anesthesia Preprocedure Evaluation (Signed)
Anesthesia Evaluation  Patient identified by MRN, date of birth, ID band Patient awake    Reviewed: Allergy & Precautions, NPO status , Patient's Chart, lab work & pertinent test results  Airway Mallampati: II  TM Distance: >3 FB Neck ROM: Full    Dental no notable dental hx. (+) Teeth Intact, Dental Advisory Given   Pulmonary neg pulmonary ROS, former smoker,    Pulmonary exam normal breath sounds clear to auscultation       Cardiovascular hypertension, Pt. on medications + CAD, + Past MI, + Cardiac Stents and + CABG  + dysrhythmias Atrial Fibrillation  Rhythm:Irregular Rate:Normal     Neuro/Psych negative neurological ROS  negative psych ROS   GI/Hepatic negative GI ROS, Neg liver ROS,   Endo/Other  diabetes, Insulin Dependent  Renal/GU negative Renal ROS  negative genitourinary   Musculoskeletal negative musculoskeletal ROS (+)   Abdominal   Peds negative pediatric ROS (+)  Hematology negative hematology ROS (+) anemia ,   Anesthesia Other Findings   Reproductive/Obstetrics negative OB ROS                             Anesthesia Physical  Anesthesia Plan  ASA: III  Anesthesia Plan: MAC   Post-op Pain Management:    Induction: Intravenous  PONV Risk Score and Plan: 1 and Ondansetron  Airway Management Planned: Nasal Cannula  Additional Equipment:   Intra-op Plan:   Post-operative Plan:   Informed Consent: I have reviewed the patients History and Physical, chart, labs and discussed the procedure including the risks, benefits and alternatives for the proposed anesthesia with the patient or authorized representative who has indicated his/her understanding and acceptance.   Dental advisory given  Plan Discussed with: CRNA and Surgeon  Anesthesia Plan Comments:         Anesthesia Quick Evaluation

## 2017-09-01 NOTE — Anesthesia Postprocedure Evaluation (Signed)
Anesthesia Post Note  Patient: Justin Blackburn.  Procedure(s) Performed: ESOPHAGOGASTRODUODENOSCOPY (EGD) WITH PROPOFOL (N/A )     Patient location during evaluation: PACU Anesthesia Type: MAC Level of consciousness: awake and alert Pain management: pain level controlled Vital Signs Assessment: post-procedure vital signs reviewed and stable Respiratory status: spontaneous breathing, nonlabored ventilation and respiratory function stable Cardiovascular status: stable and blood pressure returned to baseline Postop Assessment: no apparent nausea or vomiting Anesthetic complications: no    Last Vitals:  Vitals:   09/01/17 1120 09/01/17 1125  BP: 124/78 128/81  Pulse: (!) 108 (!) 108  Resp: 19 (!) 22  Temp:    SpO2:      Last Pain:  Vitals:   09/01/17 1105  TempSrc: Oral  PainSc:                  Lynda Rainwater

## 2017-09-01 NOTE — Care Management Obs Status (Signed)
[place] NOTIFICATION   Patient Details  Name: Justin Blackburn. MRN: [id number] Date of Birth: [birth year]   Medicare Observation Status Notification Given:  Yes    Maryclare Labrador, RN 09/01/2017, 9:10 AM

## 2017-09-01 NOTE — Progress Notes (Signed)
Progress Note  Patient Name: Justin Blackburn. Date of Encounter: 09/01/2017  Primary Cardiologist: [name], MD   Subjective   Feels well. No chest pain, SOB at rest.   Inpatient Medications    Scheduled Meds: . atorvastatin  40 mg Oral q1800  . carvedilol  6.25 mg Oral BID  . clopidogrel  75 mg Oral Daily  . diltiazem  240 mg Oral Daily  . furosemide  40 mg Oral Daily  . gabapentin  300 mg Oral BID  . losartan  100 mg Oral Daily   And  . hydrochlorothiazide  12.5 mg Oral Daily  . insulin glargine  50 Units Subcutaneous Daily  . linagliptin  5 mg Oral Daily  . pantoprazole  40 mg Oral Daily  . sucralfate  1 g Oral TID WC & HS   Continuous Infusions: . ferumoxytol     PRN Meds: acetaminophen, nitroGLYCERIN   Vital Signs    Vitals:   09/01/17 1105 09/01/17 1110 09/01/17 1120 09/01/17 1125  BP: 118/73 132/61 124/78 128/81  Pulse: (!) 109 (!) 109 (!) 108 (!) 108  Resp: (!) 22 (!) 23 19 (!) 22  Temp: 97.6 F (36.4 C)     TempSrc: Oral     SpO2:      Weight:      Height:        Intake/Output Summary (Last 24 hours) at 09/01/2017 1257 Last data filed at 09/01/2017 1101 Gross per 24 hour  Intake 440 ml  Output 750 ml  Net -310 ml   Filed Weights   08/30/17 0949  Weight: 224 lb (101.6 kg)    Telemetry    Atrial flutter with controlled rate 107.  - Personally Reviewed  ECG    None today - Personally Reviewed  Physical Exam   GEN: No acute distress.   Neck: No JVD Cardiac: IRRR, no murmurs, rubs, or gallops.  Respiratory: Clear to auscultation bilaterally. GI: Soft, nontender, non-distended  MS: No edema; No deformity. Neuro:  Nonfocal  Psych: Normal affect   Labs    Chemistry Recent Labs  Lab 08/30/17 1004 08/31/17 0219  NA 136 135  K 3.6 3.9  CL 105 105  CO2 21* 22  GLUCOSE 229* 177*  BUN 16 19  CREATININE 1.12 1.18  CALCIUM 8.8* 8.6*  PROT 7.0  --   ALBUMIN 3.5  --   AST 20  --   ALT 14*  --   ALKPHOS 57  --     BILITOT 1.1  --   GFRNONAA >60 >60  GFRAA >60 >60  ANIONGAP 10 8     Hematology Recent Labs  Lab 08/30/17 0952 08/31/17 0219 09/01/17 0334  WBC 11.4* 11.4* 11.1*  RBC 3.85* 3.50* 3.66*  HGB 8.5* 7.7* 7.9*  HCT 28.9* 26.2* 26.9*  MCV 75.1* 74.9* 73.5*  MCH 22.1* 22.0* 21.6*  MCHC 29.4* 29.4* 29.4*  RDW 17.7* 17.7* 17.1*  PLT 361 324 339    Cardiac Enzymes Recent Labs  Lab 08/30/17 1314 08/30/17 1843 08/31/17 0014  TROPONINI 0.44* 0.50* 0.43*    Recent Labs  Lab 08/30/17 1027  TROPIPOC 0.36*     BNPNo results for input(s): BNP, PROBNP in the last 168 hours.   DDimer No results for input(s): DDIMER in the last 168 hours.   Radiology    No results found.  Cardiac Studies   Cardiac cath/PCI 08/10/17: CORONARY STENT INTERVENTION  LEFT HEART CATH AND CORS/GRAFTS ANGIOGRAPHY  Conclusion  Ost LAD lesion is 100% stenosed.  Prox RCA lesion is 100% stenosed.  Ost LM to Dist LM lesion is 75% stenosed.  Prox Cx to Mid Cx lesion is 25% stenosed.  Prox Cx lesion is 70% stenosed.  LIMA graft was visualized by angiography and is normal in caliber.  The graft exhibits no disease.  RIMA graft was visualized by non-selective angiography.  A drug-eluting stent was successfully placed using a STENT SYNERGY DES 3X24.  Post intervention, there is a 0% residual stenosis.  Post intervention, there is a 0% residual stenosis.  A drug-eluting stent was successfully placed.  Post intervention, there is a 0% residual stenosis.  1. Severe native 3 vessel CAD with total occlusion of the RCA and LAD, severe stenosis of the native left main and native circumflex 2. Successful PCI of the left main and LCx using DES platforms    Echo: Study Conclusions  - Left ventricle: The cavity size was normal. There was mild   concentric hypertrophy. Systolic function was mildly reduced. The   estimated ejection fraction was in the range of 45% to 50%.   Hypokinesis of  the basal-midinferior and inferoseptal myocardium. - Mitral valve: Calcified annulus. There was mild regurgitation   directed centrally. - Left atrium: The atrium was mildly to moderately dilated. - Right ventricle: The cavity size was mildly dilated. Wall   thickness was normal. Systolic function was moderately reduced. - Right atrium: The atrium was mildly dilated.   Patient Profile     67 y.o. male with a history of CAD with recent PCI + DES to the LM and LCx 08/10/17, h/o atrial flutter s/p DCCV in 2016, chronic anticoagulation with Eliquis, HTN, HLD and chronic systolic HF, presenting back to the ED with complaint of near syncope and exertional dyspnea. EKG shows rapid atrial flutter in the 140s. POC troponin elevated at 0.36. Hgb dropping.   Assessment & Plan    1. Atrial flutter with RVR. New onset. Rate fairly well controlled on diltiazem and Coreg.  On Eliquis for anticoagulation. Currently on hold due to GI blood loss. Will increase Coreg for better rate control. Unable to consider for DCCV due to GI/bleeding issues and need to interrupt anticoagulation. Will focus on rate control now.  2. Progressive anemia. Hgb pre cath was 10.1. Post cath dropped to 9.3. Now 8.5 dropping to 7.7. Added PPI. Hgb 7.9 today. ASA and Eliquis on hold. EGD shows a friable ulcer at the GE junction. Concerning for possible adenoCA. Will await biopsy results. Will receive iron infusion. Aggressive antacid therapy. 3. CAD: h/o CABG s/p CABG in 1998 and had redo bypass again in 2007, repeat cath in 2013 withstenting of the obtuse marginal. Most recent cath 08/10/17 showed severe native 3 vessel CAD with total occlusion of the RCA and LAD, severe stenosis of the native left main and native circumflex. He underwent successful PCI of the left main and LCx using DES platforms. Triple therapy was recommended x 1 month (ASA, Plavix + Eliquis), followed by discontinuation of ASA thereafter. ASA now on hold with GI bleed.  Continue Plavix.  Mild troponin leak with flat trend most consistent with demand ischemia related to atrial flutter and anemia.  4. H/o Combined Systolic and Diastolic HF: EF previously 45-50%. Stable today. 5. HLD on statin therapy.     For questions or updates, please contact [place] Please consult [email or web address] for contact info under Cardiology/STEMI.      Signed, Davin Archuletta [place], [place]  09/01/2017, 12:57  PM

## 2017-09-01 NOTE — Op Note (Signed)
[facility] Patient Name: Justin Blackburn Procedure Date : 09/01/2017 MRN: [id number] Attending MD: Nancy Fetter Dr., MD Date of Birth: 1951-06-08 CSN: [id number] Age: 67 Admit Type: Inpatient Procedure:                Upper GI endoscopy Indications:              Iron deficiency anemia, Heme positive stool Providers:                Jeneen Rinks L. Rashelle Ireland Dr., MD, Cleda Daub, RN,                            Alan Mulder, Technician Referring MD:              Medicines:                Monitored Anesthesia Care Complications:            No immediate complications. Estimated Blood Loss:     Estimated blood loss was minimal. Procedure:                Pre-Anesthesia Assessment:                           - Prior to the procedure, a History and Physical                            was performed, and patient medications and                            allergies were reviewed. The patient's tolerance of                            previous anesthesia was also reviewed. The risks                            and benefits of the procedure and the sedation                            options and risks were discussed with the patient.                            All questions were answered, and informed consent                            was obtained. Prior Anticoagulants: The patient has                            taken Eliquis (apixaban), ASA,plavix, last dose was                            1 day prior to procedure. ASA Grade Assessment: III                            - A patient with severe systemic disease. After  reviewing the risks and benefits, the patient was                            deemed in satisfactory condition to undergo the                            procedure.                           After obtaining informed consent, the endoscope was                            passed under direct vision. Throughout the                            procedure, the  patient's blood pressure, pulse, and                            oxygen saturations were monitored continuously. The                            EG-[id number] ([id number]) scope was introduced through the                            mouth, and advanced to the second part of duodenum.                            The upper GI endoscopy was accomplished without                            difficulty. The patient tolerated the procedure                            well. Scope In: Scope Out: Findings:      Many esophageal ulcers with oozing blood were found at the       gastroesophageal junction. Biopsies were taken with a cold forceps for       histology. this really had the appearance of circumferential ulcerated       mass. It was biopsied and very friable and was hard. Right at the GE       junction.      The exam of the esophagus was otherwise normal.      The stomach was normal.      The examined duodenum was normal. Impression:               - Bleeding esophageal ulcers. Biopsied. really had                            the parents of a circumferential ulcerated mass                            suspicious for CA.                           - Normal stomach.                           -  Normal examined duodenum. Moderate Sedation:      See anesthesia note, no moderate sedation. Recommendation:           - Full liquid diet.                           - Return patient to hospital ward for ongoing care.                           - Continue present medications.                           - Use sucralfate suspension 1 gram PO QID. Procedure Code(s):        --- Professional ---                           [phone], Esophagogastroduodenoscopy, flexible,                            transoral; with biopsy, single or multiple Diagnosis Code(s):        --- Professional ---                           K22.11, Ulcer of esophagus with bleeding                           D50.9, Iron deficiency anemia, unspecified                            R19.5, Other fecal abnormalities CPT copyright 2016 [employer]. All rights reserved. The codes documented in this report are preliminary and upon coder review may  be revised to meet current compliance requirements. Nancy Fetter Dr., MD 09/01/2017 11:12:54 AM This report has been signed electronically. Number of Addenda: 0

## 2017-09-01 NOTE — Interval H&P Note (Signed)
History and Physical Interval Note:  09/01/2017 10:24 AM  Justin Blackburn.  has presented today for surgery, with the diagnosis of anemia, significant drop in hemoglobin  The various methods of treatment have been discussed with the patient and family. After consideration of risks, benefits and other options for treatment, the patient has consented to  Procedure(s): ESOPHAGOGASTRODUODENOSCOPY (EGD) WITH PROPOFOL (N/A) as a surgical intervention .  The patient's history has been reviewed, patient examined, no change in status, stable for surgery.  I have reviewed the patient's chart and labs.  Questions were answered to the patient's satisfaction.     Nancy Fetter

## 2017-09-01 NOTE — Transfer of Care (Signed)
Immediate Anesthesia Transfer of Care Note  Patient: Kandon Hosking.  Procedure(s) Performed: ESOPHAGOGASTRODUODENOSCOPY (EGD) WITH PROPOFOL (N/A )  Patient Location: Endoscopy Unit  Anesthesia Type:MAC  Level of Consciousness: awake and patient cooperative  Airway & Oxygen Therapy: Patient Spontanous Breathing  Post-op Assessment: Report given to RN and Post -op Vital signs reviewed and stable  Post vital signs: Reviewed and stable  Last Vitals:  Vitals:   09/01/17 0736 09/01/17 0957  BP: 119/80 (!) 136/94  Pulse: (!) 109 (!) 110  Resp: 19 (!) 22  Temp: 36.9 C 36.7 C  SpO2: 100% 100%    Last Pain:  Vitals:   09/01/17 0957  TempSrc: Oral  PainSc:          Complications: No apparent anesthesia complications

## 2017-09-01 NOTE — Anesthesia Procedure Notes (Signed)
Procedure Name: MAC Date/Time: 09/01/2017 10:45 AM Performed by: Lance Coon, CRNA Pre-anesthesia Checklist: Patient identified, Emergency Drugs available, Suction available, Patient being monitored and Timeout performed Patient Re-evaluated:Patient Re-evaluated prior to induction Oxygen Delivery Method: Nasal cannula

## 2017-09-02 ENCOUNTER — Encounter (HOSPITAL_COMMUNITY): Payer: Self-pay | Admitting: Gastroenterology

## 2017-09-02 DIAGNOSIS — K2211 Ulcer of esophagus with bleeding: Secondary | ICD-10-CM

## 2017-09-02 LAB — BASIC METABOLIC PANEL
Anion gap: 11 (ref 5–15)
BUN: 25 mg/dL — ABNORMAL HIGH (ref 6–20)
CO2: 23 mmol/L (ref 22–32)
Calcium: 8.7 mg/dL — ABNORMAL LOW (ref 8.9–10.3)
Chloride: 102 mmol/L (ref 101–111)
Creatinine, Ser: 1.48 mg/dL — ABNORMAL HIGH (ref 0.61–1.24)
GFR calc Af Amer: 54 mL/min — ABNORMAL LOW (ref >60)
GFR, EST NON AFRICAN AMERICAN: 47 mL/min — AB (ref >60)
Glucose, Bld: 77 mg/dL (ref 65–99)
Potassium: 3.5 mmol/L (ref 3.5–5.1)
SODIUM: 136 mmol/L (ref 135–145)

## 2017-09-02 LAB — CBC
HCT: 28.4 % — ABNORMAL LOW (ref 39.0–52.0)
Hemoglobin: 8.2 g/dL — ABNORMAL LOW (ref 13.0–17.0)
MCH: 21.4 pg — AB (ref 26.0–34.0)
MCHC: 28.9 g/dL — ABNORMAL LOW (ref 30.0–36.0)
MCV: 74.2 fL — ABNORMAL LOW (ref 78.0–100.0)
PLATELETS: 419 10*3/uL — AB (ref 150–400)
RBC: 3.83 MIL/uL — AB (ref 4.22–5.81)
RDW: 17.2 % — ABNORMAL HIGH (ref 11.5–15.5)
WBC: 11.6 10*3/uL — AB (ref 4.0–10.5)

## 2017-09-02 MED ORDER — DILTIAZEM HCL ER COATED BEADS 120 MG PO CP24
120.0000 mg | ORAL_CAPSULE | Freq: Every day | ORAL | Status: DC
  Administered 2017-09-02 – 2017-09-04 (×3): 120 mg via ORAL
  Filled 2017-09-02 (×3): qty 1

## 2017-09-02 NOTE — Plan of Care (Signed)
Continue current care plan 

## 2017-09-02 NOTE — Progress Notes (Signed)
[facility] PROGRESS NOTE Subjective Patient without any further bleeding  Objective: Vital signs in last 24 hours: Temp:  [98.1 F (36.7 C)-99.1 F (37.3 C)] 98.1 F (36.7 C) (03/21 1208) Pulse Rate:  [48-109] 66 (03/21 0730) Resp:  [15-27] 20 (03/21 1208) BP: (74-129)/(51-82) 96/52 (03/21 1208) SpO2:  [82 %-98 %] 92 % (03/21 1208) Last BM Date: 08/30/17  Intake/Output from previous day: 03/20 0701 - 03/21 0700 In: 437 [P.O.:120; I.V.:200; IV Piggyback:117] Out: 1550 [Urine:1550] Intake/Output this shift: Total I/O In: 600 [P.O.:600] Out: 200 [Urine:200]  PE: General--sitting in bed eating lunch  Abdomen--nontender  Lab Results: Recent Labs    08/31/17 0219 09/01/17 0334 09/02/17 0845  WBC 11.4* 11.1* 11.6*  HGB 7.7* 7.9* 8.2*  HCT 26.2* 26.9* 28.4*  PLT 324 339 419*   BMET Recent Labs    08/31/17 0219 09/02/17 0845  NA 135 136  K 3.9 3.5  CL 105 102  CO2 22 23  CREATININE 1.18 1.48*   LFT No results for input(s): PROT, AST, ALT, ALKPHOS, BILITOT, BILIDIR, IBILI in the last 72 hours. PT/INR No results for input(s): LABPROT, INR in the last 72 hours. PANCREAS No results for input(s): LIPASE in the last 72 hours.       Studies/Results: No results found.  Medications: I have reviewed the patient's current medications.  Assessment:   1.  Severe ulceration of the distal esophagus.  This was suspicious for possible adenocarcinoma was biopsied could be severe benign ulceration.  He is on pantoprazole and Carafate suspension   Plan: We will continue with current medications and will plan on following up after the path is returned.   Nancy Fetter 09/02/2017, 1:47 PM  This note was created using voice recognition software. Minor errors may Have occurred unintentionally.  Pager: [phone] If no answer or after hours call [phone]

## 2017-09-02 NOTE — Progress Notes (Signed)
Progress Note  Patient Name: Justin Blackburn. Date of Encounter: 09/02/2017  Primary Cardiologist: [name], MD   Subjective   Feels well. No chest pain, SOB at rest. No dizziness. No nausea.   Inpatient Medications    Scheduled Meds: . atorvastatin  40 mg Oral q1800  . carvedilol  6.25 mg Oral BID  . clopidogrel  75 mg Oral Daily  . diltiazem  120 mg Oral Daily  . furosemide  40 mg Oral Daily  . gabapentin  300 mg Oral BID  . losartan  100 mg Oral Daily   And  . hydrochlorothiazide  12.5 mg Oral Daily  . insulin glargine  50 Units Subcutaneous Daily  . linagliptin  5 mg Oral Daily  . pantoprazole  40 mg Oral Daily  . sucralfate  1 g Oral TID WC & HS   Continuous Infusions: . ferumoxytol Stopped (09/01/17 1410)   PRN Meds: acetaminophen, nitroGLYCERIN   Vital Signs    Vitals:   09/02/17 0346 09/02/17 0354 09/02/17 0430 09/02/17 0630  BP:  107/72 111/71 118/77  Pulse: 63 68 62 63  Resp: (!) 22 20 (!) 23 (!) 22  Temp:  98.6 F (37 C)    TempSrc:  Oral    SpO2: 94% 94% 96% 97%  Weight:      Height:        Intake/Output Summary (Last 24 hours) at 09/02/2017 0806 Last data filed at 09/01/2017 2300 Gross per 24 hour  Intake 437 ml  Output 1550 ml  Net -1113 ml   Filed Weights   08/30/17 0949  Weight: 224 lb (101.6 kg)    Telemetry    NSR rate 64. Converted from atrial flutter at 20:05 last night.  - Personally Reviewed  ECG    None today  Physical Exam   GEN: No acute distress.   Neck: No JVD Cardiac: RRR, no murmurs, rubs, or gallops.  Respiratory: Clear to auscultation bilaterally. GI: Soft, nontender, non-distended  MS: No edema; No deformity. Neuro:  Nonfocal  Psych: Normal affect   Labs    Chemistry Recent Labs  Lab 08/30/17 1004 08/31/17 0219  NA 136 135  K 3.6 3.9  CL 105 105  CO2 21* 22  GLUCOSE 229* 177*  BUN 16 19  CREATININE 1.12 1.18  CALCIUM 8.8* 8.6*  PROT 7.0  --   ALBUMIN 3.5  --   AST 20  --   ALT  14*  --   ALKPHOS 57  --   BILITOT 1.1  --   GFRNONAA >60 >60  GFRAA >60 >60  ANIONGAP 10 8     Hematology Recent Labs  Lab 08/30/17 0952 08/31/17 0219 09/01/17 0334  WBC 11.4* 11.4* 11.1*  RBC 3.85* 3.50* 3.66*  HGB 8.5* 7.7* 7.9*  HCT 28.9* 26.2* 26.9*  MCV 75.1* 74.9* 73.5*  MCH 22.1* 22.0* 21.6*  MCHC 29.4* 29.4* 29.4*  RDW 17.7* 17.7* 17.1*  PLT 361 324 339    Cardiac Enzymes Recent Labs  Lab 08/30/17 1314 08/30/17 1843 08/31/17 0014  TROPONINI 0.44* 0.50* 0.43*    Recent Labs  Lab 08/30/17 1027  TROPIPOC 0.36*     BNPNo results for input(s): BNP, PROBNP in the last 168 hours.   DDimer No results for input(s): DDIMER in the last 168 hours.   Radiology    No results found.  Cardiac Studies   Cardiac cath/PCI 08/10/17: CORONARY STENT INTERVENTION  LEFT HEART CATH AND CORS/GRAFTS ANGIOGRAPHY  Conclusion     Ost LAD lesion is 100% stenosed.  Prox RCA lesion is 100% stenosed.  Ost LM to Dist LM lesion is 75% stenosed.  Prox Cx to Mid Cx lesion is 25% stenosed.  Prox Cx lesion is 70% stenosed.  LIMA graft was visualized by angiography and is normal in caliber.  The graft exhibits no disease.  RIMA graft was visualized by non-selective angiography.  A drug-eluting stent was successfully placed using a STENT SYNERGY DES 3X24.  Post intervention, there is a 0% residual stenosis.  Post intervention, there is a 0% residual stenosis.  A drug-eluting stent was successfully placed.  Post intervention, there is a 0% residual stenosis.  1. Severe native 3 vessel CAD with total occlusion of the RCA and LAD, severe stenosis of the native left main and native circumflex 2. Successful PCI of the left main and LCx using DES platforms    Echo: Study Conclusions  - Left ventricle: The cavity size was normal. There was mild   concentric hypertrophy. Systolic function was mildly reduced. The   estimated ejection fraction was in the range of  45% to 50%.   Hypokinesis of the basal-midinferior and inferoseptal myocardium. - Mitral valve: Calcified annulus. There was mild regurgitation   directed centrally. - Left atrium: The atrium was mildly to moderately dilated. - Right ventricle: The cavity size was mildly dilated. Wall   thickness was normal. Systolic function was moderately reduced. - Right atrium: The atrium was mildly dilated.   Patient Profile     67 y.o. male with a history of CAD with recent PCI + DES to the LM and LCx 08/10/17, h/o atrial flutter s/p DCCV in 2016, chronic anticoagulation with Eliquis, HTN, HLD and chronic systolic HF, presenting back to the ED with complaint of near syncope and exertional dyspnea. EKG shows rapid atrial flutter in the 140s. POC troponin elevated at 0.36. Hgb dropping.   Assessment & Plan    1. Atrial flutter with RVR. New onset. Now converted spontaneously to NSR.   Was on Eliquis for anticoagulation. Currently on hold due to GI blood loss. Will reduce Cardizem dose due to low BP hold HCTZ.   2. Progressive anemia. Hgb pre cath was 10.1. Post cath dropped to 9.3. Now 8.5 dropping to 7.7. Added PPI. Hgb 7.9 yesterday. ASA and Eliquis on hold. EGD shows a friable ulcer at the GE junction. Concerning for possible adenoCA. Will await biopsy results. Will receive iron infusion. Aggressive antacid therapy. Given return of NSR anticoagulation is less of an issue. Ideally would like to resume baby ASA with one month old stent. Will continue Plavix for now until cleared by GI.  3. CAD: h/o CABG s/p CABG in 1998 and had redo bypass again in 2007, repeat cath in 2013 withstenting of the obtuse marginal. Most recent cath 08/10/17 showed severe native 3 vessel CAD with total occlusion of the RCA and LAD, severe stenosis of the native left main and native circumflex. He underwent successful PCI of the left main and LCx using DES platforms. Triple therapy was recommended x 1 month (ASA, Plavix + Eliquis),  followed by discontinuation of ASA thereafter. ASA now on hold with GI bleed. Continue Plavix.  Mild troponin leak with flat trend most consistent with demand ischemia related to atrial flutter and anemia.  4. H/o Combined Systolic and Diastolic HF: EF previously 45-50%. Stable today. 5. HLD on statin therapy.  Will advance diet and activity today. Repeat CBC ordered.  For questions or updates, please contact [place] Please consult [email or web address] for contact info under Cardiology/STEMI.      Signed, Jaloni Sorber [place], [place]  09/02/2017, 8:06 AM

## 2017-09-03 DIAGNOSIS — C159 Malignant neoplasm of esophagus, unspecified: Secondary | ICD-10-CM

## 2017-09-03 DIAGNOSIS — I2510 Atherosclerotic heart disease of native coronary artery without angina pectoris: Secondary | ICD-10-CM

## 2017-09-03 DIAGNOSIS — C799 Secondary malignant neoplasm of unspecified site: Secondary | ICD-10-CM

## 2017-09-03 DIAGNOSIS — Z87891 Personal history of nicotine dependence: Secondary | ICD-10-CM

## 2017-09-03 DIAGNOSIS — C160 Malignant neoplasm of cardia: Secondary | ICD-10-CM

## 2017-09-03 DIAGNOSIS — N289 Disorder of kidney and ureter, unspecified: Secondary | ICD-10-CM

## 2017-09-03 LAB — BASIC METABOLIC PANEL
Anion gap: 12 (ref 5–15)
BUN: 34 mg/dL — ABNORMAL HIGH (ref 6–20)
CHLORIDE: 102 mmol/L (ref 101–111)
CO2: 22 mmol/L (ref 22–32)
Calcium: 8.5 mg/dL — ABNORMAL LOW (ref 8.9–10.3)
Creatinine, Ser: 1.95 mg/dL — ABNORMAL HIGH (ref 0.61–1.24)
GFR calc non Af Amer: 33 mL/min — ABNORMAL LOW (ref >60)
GFR, EST AFRICAN AMERICAN: 39 mL/min — AB (ref >60)
Glucose, Bld: 108 mg/dL — ABNORMAL HIGH (ref 65–99)
Potassium: 3.5 mmol/L (ref 3.5–5.1)
SODIUM: 136 mmol/L (ref 135–145)

## 2017-09-03 LAB — CBC
HEMATOCRIT: 28.3 % — AB (ref 39.0–52.0)
HEMOGLOBIN: 8 g/dL — AB (ref 13.0–17.0)
MCH: 21.1 pg — ABNORMAL LOW (ref 26.0–34.0)
MCHC: 28.3 g/dL — ABNORMAL LOW (ref 30.0–36.0)
MCV: 74.7 fL — ABNORMAL LOW (ref 78.0–100.0)
Platelets: 381 10*3/uL (ref 150–400)
RBC: 3.79 MIL/uL — AB (ref 4.22–5.81)
RDW: 17.3 % — ABNORMAL HIGH (ref 11.5–15.5)
WBC: 11.8 10*3/uL — ABNORMAL HIGH (ref 4.0–10.5)

## 2017-09-03 MED ORDER — SODIUM CHLORIDE 0.9 % IV SOLN
INTRAVENOUS | Status: DC
  Administered 2017-09-03 (×2): 75 mL/h via INTRAVENOUS

## 2017-09-03 MED ORDER — ASPIRIN 81 MG PO CHEW
81.0000 mg | CHEWABLE_TABLET | Freq: Once | ORAL | Status: AC
  Administered 2017-09-03: 81 mg via ORAL
  Filled 2017-09-03: qty 1

## 2017-09-03 NOTE — Consult Note (Signed)
Reason for Referral: Esophageal cancer.  HPI: This is a pleasant 67 year old gentleman currently of [place] where he lived the majority of his life.  Gentleman with history of coronary artery disease, paroxysmal atrial flutter as well as diabetes.  He was hospitalized with weakness, dyspnea on exertion and near syncope.  He was found to have atrial flutter with rapid ventricular response. Upon evaluation, he was found to be anemic at hemoglobin of 7.7 with an MCV of 74.9.  His iron studies on 08/31/2017 showed iron level of 12 and ferritin of 8.  He received intravenous iron during this hospitalization.  Evaluation by Dr. Oletta Lamas including an endoscopy on 09/01/2017.  The endoscopy showed a bleeding esophageal ulcer that was biopsied with a circumferential ulcerated mass suspicious for malignancy.  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  Clinically, he reports no complaints at this time.  He denies any abdominal pain, dyspepsia, hematochezia or melena.  He reports he denies any chest pain or difficulty breathing.  His appetite is reasonable and has not lost weight.  He has been on Eliquis that was discontinued upon hospitalization.   He does not report any headaches, blurry vision, syncope or seizures. Does not report any fevers, chills or sweats.  Does not report any cough, wheezing or hemoptysis.  Does not report any orthopnea or leg edema.  Does not report any nausea, vomiting or abdominal pain.  Does not report any constipation or diarrhea.  Does not report any skeletal complaints.    Does not report frequency, urgency or hematuria.  Does not report any skin rashes or lesions. Does not report any heat or cold intolerance.  Does not report any lymphadenopathy or petechiae.  Does not report any anxiety or depression.  Remaining review of systems is negative.    Past Medical History:  Diagnosis Date  . Angina   . Arthritis     "hands" (08/10/2017)  . Atrial flutter, paroxysmal ([place])    arrived with aflutter with RVR, EF down to 25-30%, s/p TEE DCCV on 01/29/2015  . Chronic lower back pain    "all the time from my sciatic nerve"  . Erectile dysfunction   . Heart failure ([place])   . Hypercholesteremia   . Hypertension   . 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 Myocardial infarction ([place]) 1988; ~1610;  ~1996;~ 2000  . Obesity   . Peripheral vascular disease ([place])   . Umbilical hernia    unrepaired  :  Past Surgical History:  Procedure Laterality Date  . CARDIOVERSION N/A 01/29/2015   Procedure: CARDIOVERSION;  Surgeon: [name], MD;  Location: [place];  Service: Cardiovascular;  Laterality: N/A;  . CORONARY ANGIOPLASTY WITH STENT PLACEMENT  08/31/11   "2 today; makes total of 3 stents"  . CORONARY ANGIOPLASTY WITH STENT PLACEMENT  08/10/2017   "2 today; makes a total of 5 stents" (08/10/2017)  . CORONARY ARTERY BYPASS GRAFT  1998; 2007   CABG X 4; CABG X4  . CORONARY STENT INTERVENTION N/A 08/10/2017   Procedure: CORONARY STENT INTERVENTION;  Surgeon: Sherren Mocha, MD;  Location: [place] [facility];  Service: Cardiovascular;  Laterality: N/A;  . ESOPHAGOGASTRODUODENOSCOPY (EGD) WITH PROPOFOL N/A 09/01/2017   Procedure: ESOPHAGOGASTRODUODENOSCOPY (EGD) WITH PROPOFOL;  Surgeon: Laurence Spates, MD;  Location: [facility];  Service: Endoscopy;  Laterality: N/A;  . LEFT HEART CATH AND CORS/GRAFTS ANGIOGRAPHY N/A 08/10/2017   Procedure: LEFT HEART CATH AND CORS/GRAFTS ANGIOGRAPHY;  Surgeon: Sherren Mocha, MD;  Location: [place] [facility];  Service: Cardiovascular;  Laterality: N/A;  . LEFT HEART CATHETERIZATION WITH CORONARY/GRAFT ANGIOGRAM N/A 08/31/2011   Procedure: LEFT HEART CATHETERIZATION WITH Beatrix Fetters;  Surgeon: [name], MD;  Location: [facility] CATH LAB;  Service: Cardiovascular;  Laterality: N/A;  . PERIPHERAL VASCULAR CATHETERIZATION N/A 04/27/2016    Procedure: Abdominal Aortogram w/Lower Extremity;  Surgeon: Angelia Mould, MD;  Location: [place] [facility];  Service: Cardiovascular;  Laterality: N/A;  . TEE WITHOUT CARDIOVERSION N/A 01/29/2015   Procedure: TRANSESOPHAGEAL ECHOCARDIOGRAM (TEE);  Surgeon: [name], MD;  Location: [facility] ENDOSCOPY;  Service: Cardiovascular;  Laterality: N/A;  :   Current Facility-Administered Medications:  .  0.9 %  sodium chloride infusion, , Intravenous, Continuous, Martinique, Peter M, MD, Last Rate: 75 mL/hr at 09/03/17 1140 .  acetaminophen (TYLENOL) tablet 650 mg, 650 mg, Oral, Q4H PRN, Martinique, Peter M, MD .  atorvastatin (LIPITOR) tablet 40 mg, 40 mg, Oral, q1800, Martinique, Peter M, MD, 40 mg at 09/02/17 1718 .  carvedilol (COREG) tablet 6.25 mg, 6.25 mg, Oral, BID, Martinique, Peter M, MD, 6.25 mg at 09/03/17 0912 .  clopidogrel (PLAVIX) tablet 75 mg, 75 mg, Oral, Daily, Martinique, Peter M, MD, 75 mg at 09/03/17 0912 .  diltiazem (CARDIZEM CD) 24 hr capsule 120 mg, 120 mg, Oral, Daily, Martinique, Peter M, MD, 120 mg at 09/03/17 0912 .  ferumoxytol (FERAHEME) 510 mg in sodium chloride 0.9 % 100 mL IVPB, 510 mg, Intravenous, Weekly, Martinique, Peter M, MD, Stopped at 09/01/17 1410 .  gabapentin (NEURONTIN) capsule 300 mg, 300 mg, Oral, BID, Martinique, Peter M, MD, 300 mg at 09/03/17 0912 .  insulin glargine (LANTUS) injection 50 Units, 50 Units, Subcutaneous, Daily, Martinique, Peter M, MD, 50 Units at 09/03/17 0913 .  linagliptin (TRADJENTA) tablet 5 mg, 5 mg, Oral, Daily, Martinique, Peter M, MD, 5 mg at 09/03/17 0913 .  losartan (COZAAR) tablet 100 mg, 100 mg, Oral, Daily, 100 mg at 09/03/17 0912 **AND** [DISCONTINUED] hydrochlorothiazide (MICROZIDE) capsule 12.5 mg, 12.5 mg, Oral, Daily, Lyda Jester M, PA-C, 12.5 mg at 09/01/17 1143 .  nitroGLYCERIN (NITROSTAT) SL tablet 0.4 mg, 0.4 mg, Sublingual, Q5 min PRN, Martinique, Peter M, MD .  pantoprazole (PROTONIX) EC tablet 40 mg, 40 mg, Oral, Daily, Martinique, Peter M, MD,  40 mg at 09/03/17 0912 .  sucralfate (CARAFATE) 1 GM/10ML suspension 1 g, 1 g, Oral, TID WC & HS, Martinique, Peter M, MD, 1 g at 09/03/17 1149:  No Known Allergies:  Family History  Problem Relation Age of Onset  . Heart disease Mother   . Heart attack Mother   . Heart disease Father   . Heart attack Father   . Heart attack Brother   . Stroke Neg Hx   :  Social History   Socioeconomic History  . Marital status: Married    Spouse name: Not on file  . Number of children: Not on file  . Years of education: Not on file  . Highest education level: Not on file  Occupational History  . Not on file  Social Needs  . Financial resource strain: Not on file  . Food insecurity:    Worry: Not on file    Inability: Not on file  . Transportation needs:    Medical: Not on file    Non-medical: Not on file  Tobacco Use  . Smoking status: Former Smoker    Packs/day: 2.00    Years: 6.00  Pack years: 12.00    Types: Cigarettes    Last attempt to quit: 06/16/1971    Years since quitting: 46.2  . Smokeless tobacco: Former Systems developer    Quit date: 06/15/1986  . Tobacco comment: "chewed on cigars; never really did chew tobacco"  Substance and Sexual Activity  . Alcohol use: No  . Drug use: No  . Sexual activity: Never  Lifestyle  . Physical activity:    Days per week: Not on file    Minutes per session: Not on file  . Stress: Not on file  Relationships  . Social connections:    Talks on phone: Not on file    Gets together: Not on file    Attends religious service: Not on file    Active member of club or organization: Not on file    Attends meetings of clubs or organizations: Not on file    Relationship status: Not on file  . Intimate partner violence:    Fear of current or ex partner: Not on file    Emotionally abused: Not on file    Physically abused: Not on file    Forced sexual activity: Not on file  Other Topics Concern  . Not on file  Social History Narrative  . Not on file   :  Pertinent items are noted in HPI.  Exam: Blood pressure 116/73, pulse 62, temperature 98 F (36.7 C), temperature source Oral, resp. rate 18, height 5\' 11"  (1.803 m), weight 224 lb (101.6 kg), SpO2 100 %.  ECOG 1 General appearance: alert and cooperative appeared without distress. Head: atraumatic without any abnormalities. Eyes: conjunctivae/corneas clear. PERRL.  Sclera anicteric. Throat: lips, mucosa, and tongue normal; without oral thrush or ulcers. Resp: clear to auscultation bilaterally without rhonchi, wheezes or dullness to percussion. Cardio: regular rate and rhythm, S1, S2 normal, no murmur, click, rub or gallop GI: soft, non-tender; bowel sounds normal; no masses,  no organomegaly Skin: Skin color, texture, turgor normal. No rashes or lesions Lymph nodes: Cervical, supraclavicular, and axillary nodes normal. Neurologic: Grossly normal without any motor, sensory or deep tendon reflexes.   Musculoskeletal: No joint deformity or effusion.  Recent Labs    09/02/17 0845 09/03/17 0304  WBC 11.6* 11.8*  HGB 8.2* 8.0*  HCT 28.4* 28.3*  PLT 419* 381   Recent Labs    09/02/17 0845 09/03/17 0750  NA 136 136  K 3.5 3.5  CL 102 102  CO2 23 22  GLUCOSE 77 108*  BUN 25* 34*  CREATININE 1.48* 1.95*  CALCIUM 8.7* 8.5*      Dg Chest Portable 1 View  Result Date: 08/30/2017 CLINICAL DATA:  Near syncopal episodes since 08/26/2017. EXAM: PORTABLE CHEST 1 VIEW COMPARISON:  PA and lateral chest 01/29/2015. FINDINGS: The patient is status post CABG. There is cardiomegaly without edema. Lungs are clear. No pneumothorax or pleural effusion. No acute bony abnormality. IMPRESSION: Cardiomegaly without acute disease. Electronically Signed   By: Inge Rise M.D.   On: 08/30/2017 10:22    Assessment and Plan:   67 year old gentleman with the following issues:  1.  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  He presented with iron deficiency anemia and endoscopy showed a bleeding ulcer.  The natural course of this disease was discussed today with the patient as well as evaluation procedures and management options.  The first step is to complete the staging workup which will include an EUS and PET  scan that will be completed as an outpatient.  After completing the staging workup treatment options will be discussed with the patient.  Depending on the stage and the extent of the cancer would include primary surgical therapy alone, neoadjuvant chemotherapy followed by surgery or chemotherapy and radiation as a definitive modality.  He has widely spread metastatic disease, then curative options do not exist and any treatment will be palliative and likely in the form of systemic chemotherapy.  He appears to be in reasonable health to undergo aggressive measures although he does have coronary artery disease with recent PCI and stent placement.  He will require cardiac clearance prior to proceeding with any aggressive surgery.  2.  Iron deficiency anemia: Related to malignant ulcers of the GE junction.  He is status post iron replacement therapy and will continue to follow that on outpatient basis.  He might require retreatment in the future.  3.  Renal insufficiency: He presented with a creatinine of 1.1 and currently has a creatinine of 1.95.  Etiology unclear but could be related to a prerenal etiology and hydrochlorothiazide.  4.  Atrial flutter with rapid ventricular response: He is normal sinus rhythm at this time and anticoagulation with Eliquis has been discontinued.  He is currently on aspirin and Plavix.  5.  Follow-up: We will arrange follow-up upon his discharge at the [facility] for further evaluation and treatment.  80  minutes was spent with the patient face-to-face today.  More than 50% of time was dedicated to patient counseling, education and coordination of his care.

## 2017-09-03 NOTE — Progress Notes (Signed)
Inpatient Diabetes Program Recommendations  AACE/ADA: New Consensus Statement on Inpatient Glycemic Control (2015)  Target Ranges:  Prepandial:   less than 140 mg/dL      Peak postprandial:   less than 180 mg/dL (1-2 hours)      Critically ill patients:  140 - 180 mg/dL   Results for Justin Blackburn, Justin Blackburn (MRN [id number]) as of 09/03/2017 09:37  Ref. Range 09/02/2017 08:45 09/03/2017 07:50  Glucose Latest Ref Range: 65 - 99 mg/dL 77 108 (H)   Review of Glycemic Control  Diabetes history: DM2 Outpatient Diabetes medications: Lantus 50 units daily, Metformin 1000 mg BID, Januvia 100 mg daily Current orders for Inpatient glycemic control: Lantus 50 units daily, Tradjenta 5 mg daily  Inpatient Diabetes Program Recommendations:  Correction (SSI): While inpatient, please consider ordering CBGs with Novolog 0-9 units TID with meals and Novolog 0-5 units QHS. HgbA1C: Please consider ordering an A1C to evaluate glycemic control over the past 2-3 months.  NOTE: Patient is receiving insulin and oral DM medications and glucose is NOT being consistently monitored as an inpatient. Please order CBGs with Novolog correction scale and an A1C.   Thanks, Barnie Alderman, RN, MSN, CDE Diabetes Coordinator Inpatient Diabetes Program [phone] (Team Pager from 8am to 5pm)

## 2017-09-03 NOTE — Progress Notes (Addendum)
Progress Note  Patient Name: Justin Blackburn. Date of Encounter: 09/03/2017  Primary Cardiologist: [name], MD   Subjective   Feels well. No chest pain, SOB at rest. No dizziness. No nausea. States he ambulated yesterday without difficulty. Had 2 stools yesterday without blood.   Inpatient Medications    Scheduled Meds: . atorvastatin  40 mg Oral q1800  . carvedilol  6.25 mg Oral BID  . clopidogrel  75 mg Oral Daily  . diltiazem  120 mg Oral Daily  . gabapentin  300 mg Oral BID  . insulin glargine  50 Units Subcutaneous Daily  . linagliptin  5 mg Oral Daily  . losartan  100 mg Oral Daily  . pantoprazole  40 mg Oral Daily  . sucralfate  1 g Oral TID WC & HS   Continuous Infusions: . ferumoxytol Stopped (09/01/17 1410)   PRN Meds: acetaminophen, nitroGLYCERIN   Vital Signs    Vitals:   09/02/17 2139 09/02/17 2154 09/03/17 0051 09/03/17 0421  BP:  123/64 112/64 112/61  Pulse:   (!) 59 62  Resp: (!) 22  (!) 26 (!) 22  Temp:   98.5 F (36.9 C) 98.1 F (36.7 C)  TempSrc:   Oral Oral  SpO2:   99% 92%  Weight:      Height:        Intake/Output Summary (Last 24 hours) at 09/03/2017 0735 Last data filed at 09/02/2017 1949 Gross per 24 hour  Intake 1440 ml  Output 200 ml  Net 1240 ml   Filed Weights   08/30/17 0949  Weight: 224 lb (101.6 kg)    Telemetry    NSR rate 64. 14 beat run of atrial flutter.   ECG    None today  Physical Exam   GEN: No acute distress.   Neck: No JVD Cardiac: RRR, no murmurs, rubs, or gallops.  Respiratory: Clear to auscultation bilaterally. GI: Soft, nontender, non-distended  MS: No edema; No deformity. Neuro:  Nonfocal  Psych: Normal affect   Labs    Chemistry Recent Labs  Lab 08/30/17 1004 08/31/17 0219 09/02/17 0845  NA 136 135 136  K 3.6 3.9 3.5  CL 105 105 102  CO2 21* 22 23  GLUCOSE 229* 177* 77  BUN 16 19 25*  CREATININE 1.12 1.18 1.48*  CALCIUM 8.8* 8.6* 8.7*  PROT 7.0  --   --   ALBUMIN  3.5  --   --   AST 20  --   --   ALT 14*  --   --   ALKPHOS 57  --   --   BILITOT 1.1  --   --   GFRNONAA >60 >60 47*  GFRAA >60 >60 54*  ANIONGAP 10 8 11      Hematology Recent Labs  Lab 09/01/17 0334 09/02/17 0845 09/03/17 0304  WBC 11.1* 11.6* 11.8*  RBC 3.66* 3.83* 3.79*  HGB 7.9* 8.2* 8.0*  HCT 26.9* 28.4* 28.3*  MCV 73.5* 74.2* 74.7*  MCH 21.6* 21.4* 21.1*  MCHC 29.4* 28.9* 28.3*  RDW 17.1* 17.2* 17.3*  PLT 339 419* 381    Cardiac Enzymes Recent Labs  Lab 08/30/17 1314 08/30/17 1843 08/31/17 0014  TROPONINI 0.44* 0.50* 0.43*    Recent Labs  Lab 08/30/17 1027  TROPIPOC 0.36*     BNPNo results for input(s): BNP, PROBNP in the last 168 hours.   DDimer No results for input(s): DDIMER in the last 168 hours.   Radiology  No results found.  Cardiac Studies   Cardiac cath/PCI 08/10/17: CORONARY STENT INTERVENTION  LEFT HEART CATH AND CORS/GRAFTS ANGIOGRAPHY  Conclusion     Ost LAD lesion is 100% stenosed.  Prox RCA lesion is 100% stenosed.  Ost LM to Dist LM lesion is 75% stenosed.  Prox Cx to Mid Cx lesion is 25% stenosed.  Prox Cx lesion is 70% stenosed.  LIMA graft was visualized by angiography and is normal in caliber.  The graft exhibits no disease.  RIMA graft was visualized by non-selective angiography.  A drug-eluting stent was successfully placed using a STENT SYNERGY DES 3X24.  Post intervention, there is a 0% residual stenosis.  Post intervention, there is a 0% residual stenosis.  A drug-eluting stent was successfully placed.  Post intervention, there is a 0% residual stenosis.  1. Severe native 3 vessel CAD with total occlusion of the RCA and LAD, severe stenosis of the native left main and native circumflex 2. Successful PCI of the left main and LCx using DES platforms    Echo: Study Conclusions  - Left ventricle: The cavity size was normal. There was mild   concentric hypertrophy. Systolic function was mildly  reduced. The   estimated ejection fraction was in the range of 45% to 50%.   Hypokinesis of the basal-midinferior and inferoseptal myocardium. - Mitral valve: Calcified annulus. There was mild regurgitation   directed centrally. - Left atrium: The atrium was mildly to moderately dilated. - Right ventricle: The cavity size was mildly dilated. Wall   thickness was normal. Systolic function was moderately reduced. - Right atrium: The atrium was mildly dilated.   Patient Profile     67 y.o. male with a history of CAD with recent PCI + DES to the LM and LCx 08/10/17, h/o atrial flutter s/p DCCV in 2016, chronic anticoagulation with Eliquis, HTN, HLD and chronic systolic HF, presenting back to the ED with complaint of near syncope and exertional dyspnea. EKG shows rapid atrial flutter in the 140s. POC troponin elevated at 0.36. Hgb dropping.   Assessment & Plan    1. Atrial flutter with RVR. New onset. Converted spontaneously to NSR.   Was on Eliquis for anticoagulation. Currently on hold due to GI blood loss. Will reduce Cardizem dose due to low BP hold HCTZ.   2. Progressive anemia. Hgb pre cath was 10.1. Post cath dropped to 9.3. Now 8.5 dropping to 7.7. Added PPI. Hgb 8.0 today and stable. ASA and Eliquis on hold. EGD shows a friable ulcer at the GE junction. Bx positive for adenoCA. Received iron infusion. Continue oral iron. Aggressive antacid therapy. Given return of NSR anticoagulation is less of an issue. Discussed with GI- OK to resume baby ASA now. Continue Plavix.  3. CAD: h/o CABG s/p CABG in 1998 and had redo bypass again in 2007, repeat cath in 2013 withstenting of the obtuse marginal. Most recent cath 08/10/17 showed severe native 3 vessel CAD with total occlusion of the RCA and LAD, severe stenosis of the native left main and native circumflex. He underwent successful PCI of the left main and LCx using DES platforms.  Continue Plavix and resume ASA.  Mild troponin leak with flat trend  most consistent with demand ischemia related to atrial flutter and anemia.  4. H/o Combined Systolic and Diastolic HF: EF previously 45-50%. Stable today. 5. HLD on statin therapy. 6. AKI creatinine up to 1.48 yesterday. No recent contrast. Suspect related to volume depletion with GI blood loss. Will check  BMET today. Hold Lasix. HCTZ stopped yesterday. Start IV fluids.  7. GE junction ulcer. Pathology is positive for adenocarcinoma. Discussed with Dr. Oletta Lamas. Will need CT/PET but needs renal function to improve first. Will hydrate. Possible CT tomorrow if renal function is better. ? Oncology evaluation. Dr. Oletta Lamas to discuss with patient.    For questions or updates, please contact [place] Please consult [email or web address] for contact info under Cardiology/STEMI.      Signed, Peter Martinique, [place]  09/03/2017, 7:35 AM

## 2017-09-03 NOTE — Progress Notes (Signed)
[facility] PROGRESS NOTE Subjective Biopsy esophageal ulceration junction confirms adenocarcinoma.  Patient has been profoundly anemic and has received 1 dose of Feraheme.  Have discussed pathology results with him the fact that this may not be surgically curable and he needed more extensive workup to determine.  Discussed CT scan, EUS, oncology consult.  The patient indicated he does not wish his wife to be informed and told she is here and he wishes to tell her.  Objective: Vital signs in last 24 hours: Temp:  [98 F (36.7 C)-99.2 F (37.3 C)] 98.1 F (36.7 C) (03/22 0755) Pulse Rate:  [59-68] 59 (03/22 0755) Resp:  [20-27] 20 (03/22 0755) BP: (96-123)/(52-67) 112/67 (03/22 0755) SpO2:  [92 %-99 %] 96 % (03/22 0755) Last BM Date: 09/02/17  Intake/Output from previous day: 03/21 0701 - 03/22 0700 In: 1440 [P.O.:1440] Out: 200 [Urine:200] Intake/Output this shift: Total I/O In: 240 [P.O.:240] Out: -     Lab Results: Recent Labs    09/01/17 0334 09/02/17 0845 09/03/17 0304  WBC 11.1* 11.6* 11.8*  HGB 7.9* 8.2* 8.0*  HCT 26.9* 28.4* 28.3*  PLT 339 419* 381   BMET Recent Labs    09/02/17 0845 09/03/17 0750  NA 136 136  K 3.5 3.5  CL 102 102  CO2 23 22  CREATININE 1.48* 1.95*   LFT No results for input(s): PROT, AST, ALT, ALKPHOS, BILITOT, BILIDIR, IBILI in the last 72 hours. PT/INR No results for input(s): LABPROT, INR in the last 72 hours. PANCREAS No results for input(s): LIPASE in the last 72 hours.       Studies/Results: No results found.  Medications: I have reviewed the patient's current medications.  Assessment:   1.  Esophageal adenocarcinoma at the GE junction.  This is a rather small ulcerated lesion but still may not curable.  Patient will need a CT chest and abdomen without contrast as well as probable EUS.  Unfortunately his creatinine is been slowly creeping. 2.  Atrial flutter previously anticoagulated currently Eliquis is on  hold 3.  Recent PCI with stent placement and will require antiplatelet agents for several months.  Have discussed with Dr. Martinique patient is on Plavix and aspirin 4.  Chronic iron deficiency anemia.  Probably due to bleeding from his cancer.  He has received 1 dose of IV Feraheme.   Plan: 1.  I have called the oncology service to obtain oncology consult weekend.  Patient is aware of this diagnosis. 2.  Discussed with Dr. Martinique.  We will increase his fluids and attempt to reduce his rising creatinine so that we can proceed with CT of the chest and abdomen with IV contrast.  My partner check patient.   Nancy Fetter 09/03/2017, 10:40 AM  This note was created using voice recognition software. Minor errors may Have occurred unintentionally.  Pager: [phone] If no answer or after hours call [phone]

## 2017-09-04 ENCOUNTER — Other Ambulatory Visit: Payer: Self-pay | Admitting: Oncology

## 2017-09-04 DIAGNOSIS — C155 Malignant neoplasm of lower third of esophagus: Secondary | ICD-10-CM

## 2017-09-04 LAB — BASIC METABOLIC PANEL
Anion gap: 10 (ref 5–15)
BUN: 33 mg/dL — ABNORMAL HIGH (ref 6–20)
CHLORIDE: 102 mmol/L (ref 101–111)
CO2: 21 mmol/L — AB (ref 22–32)
Calcium: 7.8 mg/dL — ABNORMAL LOW (ref 8.9–10.3)
Creatinine, Ser: 1.69 mg/dL — ABNORMAL HIGH (ref 0.61–1.24)
GFR calc Af Amer: 46 mL/min — ABNORMAL LOW (ref >60)
GFR calc non Af Amer: 40 mL/min — ABNORMAL LOW (ref >60)
Glucose, Bld: 156 mg/dL — ABNORMAL HIGH (ref 65–99)
POTASSIUM: 3.6 mmol/L (ref 3.5–5.1)
Sodium: 133 mmol/L — ABNORMAL LOW (ref 135–145)

## 2017-09-04 LAB — CBC
HEMATOCRIT: 27.4 % — AB (ref 39.0–52.0)
Hemoglobin: 8 g/dL — ABNORMAL LOW (ref 13.0–17.0)
MCH: 22 pg — AB (ref 26.0–34.0)
MCHC: 29.2 g/dL — AB (ref 30.0–36.0)
MCV: 75.3 fL — AB (ref 78.0–100.0)
Platelets: 320 10*3/uL (ref 150–400)
RBC: 3.64 MIL/uL — AB (ref 4.22–5.81)
RDW: 17.5 % — ABNORMAL HIGH (ref 11.5–15.5)
WBC: 10.4 10*3/uL (ref 4.0–10.5)

## 2017-09-04 MED ORDER — ASPIRIN 81 MG PO TBEC: 81.0000 mg | DELAYED_RELEASE_TABLET | Freq: Every day | ORAL | Status: DC

## 2017-09-04 MED ORDER — LOSARTAN POTASSIUM 100 MG PO TABS: 100.0000 mg | ORAL_TABLET | Freq: Every day | ORAL | 11 refills | Status: DC

## 2017-09-04 MED ORDER — SUCRALFATE 1 GM/10ML PO SUSP: 1.0000 g | Freq: Three times a day (TID) | ORAL | 0 refills | Status: DC

## 2017-09-04 MED ORDER — ASPIRIN EC 81 MG PO TBEC: 81.0000 mg | DELAYED_RELEASE_TABLET | Freq: Every day | ORAL | Status: DC

## 2017-09-04 MED ORDER — ACETAMINOPHEN 325 MG PO TABS: 650.0000 mg | ORAL_TABLET | Freq: Four times a day (QID) | ORAL | Status: DC | PRN

## 2017-09-04 MED ORDER — PANTOPRAZOLE SODIUM 40 MG PO TBEC: 40.0000 mg | DELAYED_RELEASE_TABLET | Freq: Every day | ORAL | 11 refills | Status: DC

## 2017-09-04 MED ORDER — DILTIAZEM HCL ER COATED BEADS 120 MG PO CP24: 120.0000 mg | ORAL_CAPSULE | Freq: Every day | ORAL | 11 refills | Status: DC

## 2017-09-04 NOTE — Progress Notes (Signed)
[facility] Gastroenterology Progress Note  Yasseen Salls. 67 y.o. 09/13/1950  CC:  Anemia, esophageal cancer   Subjective: patient continues to deny any bleeding episode. Complaining of abdominal distention.denies diarrhea or constipation.  ROS : negative for chest pain. Baseline shortness of breath.   Objective: Vital signs in last 24 hours: Vitals:   09/04/17 0400 09/04/17 0747  BP: 127/75 120/71  Pulse: (!) 57 60  Resp: (!) 23 19  Temp: 98.2 F (36.8 C) 97.7 F (36.5 C)  SpO2: 100% 98%    Physical Exam:  General:  Alert, cooperative, no distress, appears stated age  Head:  Normocephalic, without obvious abnormality, atraumatic  Eyes:  , EOM's intact,   Lungs:   Clear to auscultation bilaterally, respirations unlabored  Heart:  Regular rate and rhythm, S1, S2 normal  Abdomen:   Soft, non-tender, mild distended, bowel sounds present, no peritoneal signs  Extremities: Extremities normal, atraumatic, no  edema       Lab Results: Recent Labs    09/03/17 0750 09/04/17 0241  NA 136 133*  K 3.5 3.6  CL 102 102  CO2 22 21*  GLUCOSE 108* 156*  BUN 34* 33*  CREATININE 1.95* 1.69*  CALCIUM 8.5* 7.8*   No results for input(s): AST, ALT, ALKPHOS, BILITOT, PROT, ALBUMIN in the last 72 hours. Recent Labs    09/03/17 0304 09/04/17 0241  WBC 11.8* 10.4  HGB 8.0* 8.0*  HCT 28.3* 27.4*  MCV 74.7* 75.3*  PLT 381 320   No results for input(s): LABPROT, INR in the last 72 hours.    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 Diagnosed based on EGD on 09/01/2017. - Iron deficiency anemia. Hemoglobin stable. - acute on chronic kidney disease. Creatinine 1.69 today - Atrial Flutter - Eliquis on hold because of anemia  - CAD S/P PCI 08/10/2017 - on aspirin and Plavix.  Recommendations --------------------------- - no further inpatient GI workup planned at this time. - recommend EUS after  PET scan if clinically indicated - Follow-up in GI clinic in 4 weeks - GI will sign off. Call us back if needed  Otis Brace MD, [place] 09/04/2017, 10:49 AM  Contact #  [phone]

## 2017-09-04 NOTE — Progress Notes (Signed)
Progress Note  Patient Name: Justin Blackburn. Date of Encounter: 09/04/2017  Primary Cardiologist: [name], MD   Subjective   No cardiac complaints Sanguine about cancer diagnosis   Inpatient Medications    Scheduled Meds: . atorvastatin  40 mg Oral q1800  . carvedilol  6.25 mg Oral BID  . clopidogrel  75 mg Oral Daily  . diltiazem  120 mg Oral Daily  . gabapentin  300 mg Oral BID  . insulin glargine  50 Units Subcutaneous Daily  . linagliptin  5 mg Oral Daily  . losartan  100 mg Oral Daily  . pantoprazole  40 mg Oral Daily  . sucralfate  1 g Oral TID WC & HS   Continuous Infusions: . sodium chloride 75 mL/hr at 09/04/17 1200  . ferumoxytol Stopped (09/01/17 1410)   PRN Meds: acetaminophen, nitroGLYCERIN   Vital Signs    Vitals:   09/04/17 0000 09/04/17 0400 09/04/17 0747 09/04/17 1128  BP:  127/75 120/71 133/76  Pulse:  (!) 57 60 63  Resp: 18 (!) 23 19 (!) 23  Temp:  98.2 F (36.8 C) 97.7 F (36.5 C) 98.1 F (36.7 C)  TempSrc:  Oral Oral Oral  SpO2:  100% 98% 100%  Weight:      Height:        Intake/Output Summary (Last 24 hours) at 09/04/2017 1212 Last data filed at 09/04/2017 1200 Gross per 24 hour  Intake 2785 ml  Output 975 ml  Net 1810 ml   Filed Weights   08/30/17 0949  Weight: 224 lb (101.6 kg)    Telemetry    NSR 09/04/2017 no flutter   ECG    09/02/17 flutter RBBB poor R wave progression   Physical Exam   BP 133/76 (BP Location: Right Arm)   Pulse 63   Temp 98.1 F (36.7 C) (Oral)   Resp (!) 23   Ht 5\' 11"  (1.803 m)   Wt 224 lb (101.6 kg)   SpO2 100%   BMI 31.24 kg/m  Affect appropriate Pale chronically ill male  HEENT: normal Neck supple with no adenopathy JVP normal no bruits no thyromegaly Lungs clear with no wheezing and good diaphragmatic motion Heart:  S1/S2 no murmur, no rub, gallop or click PMI normal Abdomen: benighn, BS positve, no tenderness, no AAA no bruit.  No HSM or HJR Distal pulses intact  with no bruits No edema Neuro non-focal Skin warm and dry No muscular weakness   Labs    Chemistry Recent Labs  Lab 08/30/17 1004  09/02/17 0845 09/03/17 0750 09/04/17 0241  NA 136   < > 136 136 133*  K 3.6   < > 3.5 3.5 3.6  CL 105   < > 102 102 102  CO2 21*   < > 23 22 21*  GLUCOSE 229*   < > 77 108* 156*  BUN 16   < > 25* 34* 33*  CREATININE 1.12   < > 1.48* 1.95* 1.69*  CALCIUM 8.8*   < > 8.7* 8.5* 7.8*  PROT 7.0  --   --   --   --   ALBUMIN 3.5  --   --   --   --   AST 20  --   --   --   --   ALT 14*  --   --   --   --   ALKPHOS 57  --   --   --   --  BILITOT 1.1  --   --   --   --   GFRNONAA >60   < > 47* 33* 40*  GFRAA >60   < > 54* 39* 46*  ANIONGAP 10   < > 11 12 10    < > = values in this interval not displayed.     Hematology Recent Labs  Lab 09/02/17 0845 09/03/17 0304 09/04/17 0241  WBC 11.6* 11.8* 10.4  RBC 3.83* 3.79* 3.64*  HGB 8.2* 8.0* 8.0*  HCT 28.4* 28.3* 27.4*  MCV 74.2* 74.7* 75.3*  MCH 21.4* 21.1* 22.0*  MCHC 28.9* 28.3* 29.2*  RDW 17.2* 17.3* 17.5*  PLT 419* 381 320    Cardiac Enzymes Recent Labs  Lab 08/30/17 1314 08/30/17 1843 08/31/17 0014  TROPONINI 0.44* 0.50* 0.43*    Recent Labs  Lab 08/30/17 1027  TROPIPOC 0.36*     BNPNo results for input(s): BNP, PROBNP in the last 168 hours.   DDimer No results for input(s): DDIMER in the last 168 hours.   Radiology    No results found.  Cardiac Studies   Cardiac cath/PCI 08/10/17: CORONARY STENT INTERVENTION  LEFT HEART CATH AND CORS/GRAFTS ANGIOGRAPHY  Conclusion     Ost LAD lesion is 100% stenosed.  Prox RCA lesion is 100% stenosed.  Ost LM to Dist LM lesion is 75% stenosed.  Prox Cx to Mid Cx lesion is 25% stenosed.  Prox Cx lesion is 70% stenosed.  LIMA graft was visualized by angiography and is normal in caliber.  The graft exhibits no disease.  RIMA graft was visualized by non-selective angiography.  A drug-eluting stent was successfully  placed using a STENT SYNERGY DES 3X24.  Post intervention, there is a 0% residual stenosis.  Post intervention, there is a 0% residual stenosis.  A drug-eluting stent was successfully placed.  Post intervention, there is a 0% residual stenosis.  1. Severe native 3 vessel CAD with total occlusion of the RCA and LAD, severe stenosis of the native left main and native circumflex 2. Successful PCI of the left main and LCx using DES platforms    Echo: Study Conclusions  - Left ventricle: The cavity size was normal. There was mild   concentric hypertrophy. Systolic function was mildly reduced. The   estimated ejection fraction was in the range of 45% to 50%.   Hypokinesis of the basal-midinferior and inferoseptal myocardium. - Mitral valve: Calcified annulus. There was mild regurgitation   directed centrally. - Left atrium: The atrium was mildly to moderately dilated. - Right ventricle: The cavity size was mildly dilated. Wall   thickness was normal. Systolic function was moderately reduced. - Right atrium: The atrium was mildly dilated.   Patient Profile     67 y.o. male with a history of CAD with recent PCI + DES to the LM and LCx 08/10/17, h/o atrial flutter s/p DCCV in 2016, chronic anticoagulation with Eliquis, HTN, HLD and chronic systolic HF, presenting back to the ED with complaint of near syncope and exertional dyspnea. EKG shows rapid atrial flutter in the 140s. POC troponin elevated at 0.36. Hgb dropping.   Assessment & Plan    1. Atrial flutter with RVR. Converted continue lower dose cardizem no anticoagulation due To need for antiplatelet's , anemia and new diagnosis of esophageal cancer 2. Progressive anemia. Stable likely due to cancer iron Rx  3. CAD: h/o CABG s/p CABG in 1998 and had redo bypass again in 2007 Patent LIMA/RIMA PCI of the left main and LCx  using DES platforms.  Continue Plavix and resume ASA.  Mild troponin leak with flat trend most consistent with  demand ischemia related to atrial flutter and anemia.  4. H/o Combined Systolic and Diastolic HF: EF previously 45-50%. Stable today. 5. HLD on statin therapy. 6. AKI stable post contrast 1.69 today  7. Esophageal Cancer:  Per GI they are ok with doing CT scan as outpatient   D/c home plavix and 81 mg ASA f/u oncology and Dr Tamala Julian    For questions or updates, please contact [facility] Please consult [email or web address] for contact info under Cardiology/STEMI.      Rozann Lesches, MD,FACC  09/04/2017, 12:12 PM

## 2017-09-04 NOTE — Discharge Summary (Signed)
Discharge Summary    Patient ID: Justin Blackburn.,  MRN: [id number], DOB/AGE: 1950/10/07 67 y.o.  Admit date: 08/30/2017 Discharge date: 09/04/2017  Primary Care Provider: [facility], [facility] Primary Cardiologist: [name], MD  Discharge Diagnoses    Principal Problem:   Atrial flutter with rapid ventricular response [facility]) Active Problems:   Hypertension   Diabetes mellitus ([place])   Hypercholesteremia   Chronic combined systolic and diastolic heart failure, NYHA class 2 (HCC)   Demand ischemia (HCC)   Hx of CABG 1998 and 2007   Elevated troponin   Iron deficiency anemia   Adenosquamous carcinoma of esophagus (HCC)   Allergies No Known Allergies  Diagnostic Studies/Procedures    Echo 08/31/17 Endoscopy 09/01/17 _____________   History of Present Illness     67 y/o male followed by Dr Tamala Julian with a history of CAD and PAF, admitted 08/30/17 with AF with RVR.  Hospital Course     Consultants: Dr Alessandra Bevels, P- GI 08/31/17                      Dr Alen Blew, Wanda Plump -Onc 09/03/17                           67 y.o. male with a history of CAD, s/p CABG in 1998 with recent PCI + DES to the LM and LCx 08/10/17, a h/o atrial flutter s/p DCCV in 2016, on chronic anticoagulation with Eliquis, HTN, HLD and chronic systolic HF, who presented the ED with complaints of near syncope and exertional dyspnea. His EKG showed rapid atrial flutter in the 140s. POC troponin was elevated at 0.36 and his Hgb was on adm and dropped to 7.7. His Eliquis was held. His rate was controlled with IV Diltiazem and beta blocker. He was seen in consult by the GI service 08/31/17. The pt's Hgb pre cath Feb 2019 was 10.1. Post cath dropped to 9.3. Now 8.5 dropping to 7.7. PPI and iron  was added.  ASA and Eliquis on hold, Plavix contniued. EGD showed a friable ulcer at the GE junction and subsequent biopsy revealed adenocarcinoma. The pt was taken off Eliquis, ASA resumed and he was continued on Plavix.  He did convert spontaneously to NSR before discharge. The pt will need further evaluation for his adenocarcinoma but his SCr bumped to 1.69 and the plan is to follow this as an OP, further work up when his renal function stabilizes. The pt did have a mild troponin leak with flat trend most consistent with demand ischemia related to atrial flutter and anemia. Echo done this admission showed an EF of 45%. The pt was seen by Dr Johnsie Cancel 09/04/17 and felt to be stable for discharge. He'll need a TOC office visit in one week.    _____________  Discharge Vitals Blood pressure 133/76, pulse 63, temperature 98.1 F (36.7 C), temperature source Oral, resp. rate (!) 23, height 5\' 11"  (1.803 m), weight 224 lb (101.6 kg), SpO2 100 %.  Filed Weights   08/30/17 0949  Weight: 224 lb (101.6 kg)    Labs & Radiologic Studies    CBC Recent Labs    09/03/17 0304 09/04/17 0241  WBC 11.8* 10.4  HGB 8.0* 8.0*  HCT 28.3* 27.4*  MCV 74.7* 75.3*  PLT 381 409   Basic Metabolic Panel Recent Labs    09/03/17 0750 09/04/17 0241  NA 136 133*  K 3.5 3.6  CL 102 102  CO2 22 21*  GLUCOSE 108* 156*  BUN 34* 33*  CREATININE 1.95* 1.69*  CALCIUM 8.5* 7.8*   _____________  Dg Chest Portable 1 View  Result Date: 08/30/2017 CLINICAL DATA:  Near syncopal episodes since 08/26/2017. EXAM: PORTABLE CHEST 1 VIEW COMPARISON:  PA and lateral chest 01/29/2015. FINDINGS: The patient is status post CABG. There is cardiomegaly without edema. Lungs are clear. No pneumothorax or pleural effusion. No acute bony abnormality. IMPRESSION: Cardiomegaly without acute disease. Electronically Signed   By: Inge Rise M.D.   On: 08/30/2017 10:22   Disposition   Pt is being discharged home today in good condition.  Follow-up Plans & Appointments    Follow-up Information    Brahmbhatt, Parag, MD. Schedule an appointment as soon as possible for a visit in 4 week(s).   Specialty:  Gastroenterology Why:  Newly diagnosed  esophageal cancer. May need EUS Contact information: [place] [address] [phone]        Belva Crome, MD Follow up.   Specialty:  Cardiology Why:  office will conatct you Contact information: [address] [place] [address] [phone]            Discharge Medications   Allergies as of 09/04/2017   No Known Allergies     Medication List    STOP taking these medications   carvedilol 6.25 MG tablet Commonly known as:  COREG   ELIQUIS 5 MG Tabs tablet Generic drug:  apixaban   furosemide 40 MG tablet Commonly known as:  LASIX   losartan-hydrochlorothiazide 100-12.5 MG tablet Commonly known as:  HYZAAR   metFORMIN 1000 MG tablet Commonly known as:  GLUCOPHAGE     TAKE these medications   acetaminophen 325 MG tablet Commonly known as:  TYLENOL Take 2 tablets (650 mg total) by mouth every 6 (six) hours as needed for mild pain or headache.   aspirin 81 MG EC tablet Take 1 tablet (81 mg total) by mouth daily.   atorvastatin 40 MG tablet Commonly known as:  LIPITOR Take 40 mg by mouth daily at 6 PM.   clopidogrel 75 MG tablet Commonly known as:  PLAVIX Take 1 tablet (75 mg total) by mouth daily. Take for 6 months. Plan to stop on August 27th. Check with cardiologist at that time.   diltiazem 120 MG 24 hr capsule Commonly known as:  CARDIZEM CD Take 1 capsule (120 mg total) by mouth daily. Start taking on:  09/05/2017   feeding supplement (ENSURE ENLIVE) Liqd Take 237 mLs by mouth 2 (two) times daily between meals.   ferrous sulfate 325 (65 FE) MG tablet Take 325 mg by mouth daily.   gabapentin 300 MG capsule Commonly known as:  NEURONTIN Take 300 mg by mouth 2 (two) times daily.   LANTUS SOLOSTAR 100 UNIT/ML Solostar Pen Generic drug:  Insulin Glargine Inject 50 Units into the skin daily.   losartan 100 MG tablet Commonly known as:  COZAAR Take 1 tablet (100 mg total) by mouth  daily. Start taking on:  09/05/2017   nitroGLYCERIN 0.4 MG SL tablet Commonly known as:  NITROSTAT Place 1 tablet (0.4 mg total) under the tongue every 5 (five) minutes as needed for chest pain (Angina).   pantoprazole 40 MG tablet Commonly known as:  PROTONIX Take 1 tablet (40 mg total) by mouth daily. Start taking on:  09/05/2017   sitaGLIPtin 100 MG tablet Commonly known as:  JANUVIA  Take 100 mg by mouth daily.   sucralfate 1 GM/10ML suspension Commonly known as:  CARAFATE Take 10 mLs (1 g total) by mouth 4 (four) times daily -  with meals and at bedtime.        Outstanding Labs/Studies    Duration of Discharge Encounter   Greater than 30 minutes including physician time.  Angelena Form PA 09/04/2017, 2:12 PM

## 2017-09-04 NOTE — Progress Notes (Signed)
Reviewed discharge orders with pt, including follow up appts, new medications and when to take and side effects. Pt verbalizes understanding.

## 2017-09-06 ENCOUNTER — Telehealth: Payer: Self-pay | Admitting: Interventional Cardiology

## 2017-09-06 ENCOUNTER — Telehealth: Payer: Self-pay | Admitting: Oncology

## 2017-09-06 NOTE — Telephone Encounter (Signed)
Patient's wife called in very confused for why there had been an appt made. I had scheduled a follow-up with FS per 3/23 sch msg. Did not know that it was a hospital follow-up. The patient said that they would rather wait after having further testing done to see Dr. Alen Blew.

## 2017-09-06 NOTE — Telephone Encounter (Signed)
Scheduled appt per 3/23 sch msg - left voicemail for patient regarding appts.

## 2017-09-06 NOTE — Telephone Encounter (Signed)
New message  Patient spouse calling to request a asap follow up from the hospital. Declined to schedule with APP staff. Requesting to speak with nurse. Please advise

## 2017-09-06 NOTE — Consult Note (Signed)
            [facility] Primary Care Navigator  09/06/2017  Justin Blackburn. January 04, 1951 [id number]   Attemptto seepatient at the bedsideto identify possible discharge needs buthe was alreadydischarged.   Per chart review, patient was discharged home with home health services over the weekend.  PerMD note, patient was admitted for complaints of near syncope and exertional dyspnea. EKG showed rapid atrial flutter in the 140s; troponin was elevated and his hemoglobin dropped to 7.7. Patient was seen by Inpatient diabetes coordinator as inpatient related to elevated A1c of 7.9 (07/13/17).  Primary care provider's office called Maudie Mercury) to notify of patient's discharge and need for post hospital follow-up and transition of care (TOC). Notified of patient's health issues needing follow-up. Made aware of necessity to refer patient to [facility] care management as deemed necessaryandappropriate for any services.  Patient has discharge instruction to follow-up with primary care provider in 1 week, as well as follow-up with cardiology and gastroenterology.   For additional questions please contact:  Edwena Felty A. Suyash Amory, BSN, RN-BC [facility] PRIMARY CARE Navigator Cell: [phone]

## 2017-09-06 NOTE — Telephone Encounter (Signed)
Spoke with wife, DPR on file.  She states pt was recently hospitalized and was told to f/u with Dr. Tamala Julian within the next two weeks.  Advised I do not have any openings with Dr. Tamala Julian but could put him with an APP on a day that Dr. Tamala Julian was in the office.  Wife said no and that pt wanted only to see Dr. Tamala Julian.  Spoke with pt and explained that Dr. Tamala Julian did not have any openings but could get him with APP.  Pt agreeable but upset about this.  Spoke with wife again and scheduled pt to see Richardson Dopp, PA-C on 4/1 when Dr. Tamala Julian is in the office.  Wife verbalized understanding.

## 2017-09-07 ENCOUNTER — Telehealth: Payer: Self-pay | Admitting: *Deleted

## 2017-09-07 NOTE — Telephone Encounter (Signed)
Wife Justin Blackburn calling. She has had calls from scheduling to schedule endoscopy and a pet scan and wants to know why  all of these are being ordered, when patient has not been seen by dr Alen Blew yet. She states this is very confusing. She cancelled appt with dr Alen Blew on April 1st d/t patient has an appt with dr Tamala Julian @ 11:00 am  that same day.

## 2017-09-07 NOTE — Telephone Encounter (Signed)
I saw him in the hospital and I discussed these with him. I encourage him to do these tests prior to his next appointment.

## 2017-09-07 NOTE — Telephone Encounter (Signed)
Per dr Alen Blew, he saw   patient in the hospital and encouraged him to have scans done prior to seeing dr Alen Blew in the office. Patient's wife patricia notified and verbalized understanding.

## 2017-09-10 ENCOUNTER — Encounter: Payer: Self-pay | Admitting: *Deleted

## 2017-09-11 ENCOUNTER — Encounter (HOSPITAL_COMMUNITY): Payer: Self-pay | Admitting: Cardiovascular Disease

## 2017-09-13 ENCOUNTER — Ambulatory Visit: Payer: Medicare HMO | Admitting: Oncology

## 2017-09-13 ENCOUNTER — Ambulatory Visit: Payer: Medicare HMO | Admitting: Physician Assistant

## 2017-09-13 ENCOUNTER — Encounter: Payer: Self-pay | Admitting: Physician Assistant

## 2017-09-13 VITALS — BP 140/78 | HR 74 | Ht 71.0 in | Wt 237.8 lb

## 2017-09-13 DIAGNOSIS — I4892 Unspecified atrial flutter: Secondary | ICD-10-CM

## 2017-09-13 DIAGNOSIS — C159 Malignant neoplasm of esophagus, unspecified: Secondary | ICD-10-CM | POA: Diagnosis not present

## 2017-09-13 DIAGNOSIS — I2510 Atherosclerotic heart disease of native coronary artery without angina pectoris: Secondary | ICD-10-CM | POA: Diagnosis not present

## 2017-09-13 DIAGNOSIS — I5042 Chronic combined systolic (congestive) and diastolic (congestive) heart failure: Secondary | ICD-10-CM

## 2017-09-13 DIAGNOSIS — I10 Essential (primary) hypertension: Secondary | ICD-10-CM | POA: Diagnosis not present

## 2017-09-13 MED ORDER — FUROSEMIDE 20 MG PO TABS: 20.0000 mg | ORAL_TABLET | ORAL | 3 refills | Status: DC

## 2017-09-13 MED ORDER — CANDESARTAN CILEXETIL 16 MG PO TABS: 16.0000 mg | ORAL_TABLET | Freq: Every day | ORAL | 3 refills | Status: DC

## 2017-09-13 NOTE — Progress Notes (Signed)
Cardiology Office Note:    Date:  09/13/2017   ID:  Justin Blackburn., DOB May 14, 1951, MRN [id number]  PCP:  [facility] [place]  Cardiologist:  [name], MD  GI:  Dr. Alessandra Bevels Oncology:  Dr. Alen Blew  Referring MD: [facility] [place]   Chief Complaint  Patient presents with  . Hospitalization Follow-up    Admitted with atrial flutter, anemia    History of Present Illness:    Justin Blackburn. is a 67 y.o. male with coronary artery disease status post CABG in 1998 with redo bypass in 2007 and subsequent stenting of the obtuse marginal in 2013.  Other history includes hypertension, diabetes, hyperlipidemia, paroxysmal atrial fibrillation/flutter s/p DCCV in 2016.  He underwent Cardiac Catheterization in 07/2017 secondary to class III angina.  This demonstrated three-vessel CAD with total occlusion of the RCA and LAD and severe stenosis of the native left main and LCx.  He underwent PCI with drug-eluting stent to the left main and drug-eluting stent to the LCx.  He was placed on aspirin, Plavix in addition to Apixaban for 30 days.  Then, he was continued on Apixaban and Plavix only.  He was last seen in the office 08/24/17 by Lyda Jester, PA-C.  He was doing well at that time without chest pain.  He was then admitted 3/18-3/23 atrial flutter with RVR (HR 140s) and profound anemia.  His hemoglobin dropped to 7.7.  Aspirin and Apixaban were placed on hold.  EGD demonstrated demonstrated esophageal adenocarcinoma.  He converted spontaneously to normal sinus rhythm.  Troponin was increased without clear trend and this was felt to be related to demand ischemia from atrial flutter and anemia.  Follow-up echo demonstrated EF 45-50 with inferior hypokinesis.  He was kept off of Apixaban at discharge.  Justin Blackburn returns for follow-up.  He is here today with his wife.  Recently, he has noted a nonproductive cough.  He attributes this to sinus drainage.  He does note dyspnea  with exertion.  This seems to be somewhat improved since discharge.  However, he has noted increasing lower extremity edema.  Of note, his furosemide was held due to worsening creatinine.  He denies orthopnea, paroxysmal nocturnal dyspnea.  He denies chest pain or syncope.  He denies any bleeding issues.  Prior CV studies:   The following studies were reviewed today:  Echo 08/31/17 Mild concentric LVH, EF 45-50, inferior/inferoseptal hypokinesis, mild MR, mild to moderate LAE, mild RVE, moderately reduced RVSF, mild RAE  Cardiac catheterization 08/10/17 LM ostial 75 LAD ostial 100 LCx proximal 70, mid stent patent with 25 ISR RCA proximal 100 LIMA-LAD patent RIMA-RCA patent PCI: Synergy DES to the left main; Synergy DES to the LCx        Echo 02/27/15 Mild LVH, EF 55, grade 1 diastolic dysfunction, mild MAC, mild LAE, mildly reduced RVSF, mild RAE  Past Medical History:  Diagnosis Date  . Angina   . Arthritis    "hands" (08/10/2017)  . Atrial flutter, paroxysmal ([place])    arrived with aflutter with RVR, EF down to 25-30%, s/p TEE DCCV on 01/29/2015  . Chronic lower back pain    "all the time from my sciatic nerve"  . Erectile dysfunction   . Heart failure ([place])   . Hypercholesteremia   . Hypertension   . 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 Myocardial infarction ([place]) 1988; ~8937;  ~1996;~ 2000  . Obesity   . Peripheral vascular  disease ([place])   . Umbilical hernia    unrepaired   Surgical Hx: The patient  has a past surgical history that includes left heart catheterization with coronary/graft angiogram (N/A, 08/31/2011); TEE without cardioversion (N/A, 01/29/2015); Cardioversion (N/A, 01/29/2015); Cardiac catheterization (N/A, 04/27/2016); Coronary angioplasty with stent (08/31/11); Coronary angioplasty with stent (08/10/2017); Coronary artery bypass graft (1998; 2007); Esophagogastroduodenoscopy (egd) with propofol (N/A, 09/01/2017); LEFT HEART CATH AND  CORS/GRAFTS ANGIOGRAPHY (N/A, 08/10/2017); and CORONARY STENT INTERVENTION (N/A, 08/10/2017).   Current Medications: Current Meds  Medication Sig  . acetaminophen (TYLENOL) 325 MG tablet Take 2 tablets (650 mg total) by mouth every 6 (six) hours as needed for mild pain or headache.  [name] aspirin EC 81 MG EC tablet Take 1 tablet (81 mg total) by mouth daily.  [name] atorvastatin (LIPITOR) 40 MG tablet Take 40 mg by mouth daily at 6 PM.   . clopidogrel (PLAVIX) 75 MG tablet Take 1 tablet (75 mg total) by mouth daily. Take for 6 months. Plan to stop on August 27th. Check with cardiologist at that time.  [name] diltiazem (CARDIZEM CD) 120 MG 24 hr capsule Take 1 capsule (120 mg total) by mouth daily.  . ferrous sulfate 325 (65 FE) MG tablet Take 325 mg by mouth daily.  [name] gabapentin (NEURONTIN) 300 MG capsule Take 300 mg by mouth 2 (two) times daily.  [name] LANTUS SOLOSTAR 100 UNIT/ML Solostar Pen Inject 50 Units into the skin daily.   . nitroGLYCERIN (NITROSTAT) 0.4 MG SL tablet Place 1 tablet (0.4 mg total) under the tongue every 5 (five) minutes as needed for chest pain (Angina).  . pantoprazole (PROTONIX) 40 MG tablet Take 1 tablet (40 mg total) by mouth daily.  . sitaGLIPtin (JANUVIA) 100 MG tablet Take 100 mg by mouth daily.  . sucralfate (CARAFATE) 1 GM/10ML suspension Take 10 mLs (1 g total) by mouth 4 (four) times daily -  with meals and at bedtime.  . [DISCONTINUED] losartan (COZAAR) 100 MG tablet Take 1 tablet (100 mg total) by mouth daily.     Allergies:   Patient has no known allergies.   Social History   Tobacco Use  . Smoking status: Former Smoker    Packs/day: 2.00    Years: 6.00    Pack years: 12.00    Types: Cigarettes    Last attempt to quit: 06/16/1971    Years since quitting: 46.2  . Smokeless tobacco: Former Systems developer    Quit date: 06/15/1986  . Tobacco comment: "chewed on cigars; never really did chew tobacco"  Substance Use Topics  . Alcohol use: No  . Drug use: No     Family Hx: The  patient's family history includes Heart attack in his brother, father, and mother; Heart disease in his father and mother. There is no history of Stroke.  ROS:   Please see the history of present illness.    Review of Systems  Cardiovascular: Positive for dyspnea on exertion and leg swelling.  Respiratory: Positive for cough and snoring.   Hematologic/Lymphatic: Bruises/bleeds easily.  Musculoskeletal: Positive for back pain.   All other systems reviewed and are negative.   EKGs/Labs/Other Test Reviewed:    EKG:  EKG is  ordered today.  The ekg ordered today demonstrates normal sinus rhythm, heart rate 75, left axis deviation, IVCD, inferior and anteroseptal Q waves, QTC 469, no change from prior tracing  Recent Labs: 08/30/2017: ALT 14; TSH 0.671 09/04/2017: BUN 33; Creatinine, Ser 1.69; Hemoglobin 8.0; Platelets 320; Potassium 3.6; Sodium 133  Recent Lipid Panel No results found for: CHOL, TRIG, HDL, CHOLHDL, LDLCALC, LDLDIRECT  Physical Exam:    VS:  BP 140/78   Pulse 74   Ht 5' 11"  (1.803 m)   Wt 237 lb 12.8 oz (107.9 kg)   SpO2 95%   BMI 33.17 kg/m     Wt Readings from Last 3 Encounters:  09/13/17 237 lb 12.8 oz (107.9 kg)  08/30/17 224 lb (101.6 kg)  08/24/17 224 lb (101.6 kg)     Physical Exam  Constitutional: He is oriented to person, place, and time. He appears well-developed and well-nourished. No distress.  HENT:  Head: Normocephalic and atraumatic.  Neck: JVD (JVP 7 cm) present.  Cardiovascular: Normal rate and regular rhythm.  No murmur heard. Pulmonary/Chest: Effort normal. He has no rales.  Abdominal: Soft.  Musculoskeletal: He exhibits edema (1+ bilat LE edema).  Neurological: He is alert and oriented to person, place, and time.  Skin: Skin is warm and dry.    ASSESSMENT & PLAN:    #1.  Chronic combined systolic and diastolic heart failure EF by recent echo 45-50.  He is more volume overloaded since coming off of his Lasix.  In the setting of  anemia, his creatinine increased in the hospital.  Question of EF is decreased secondary to rapid ventricular rate.  We will need to reassess his LV function at some point.  If EF remains less than 55%, we will need to consider changing diltiazem to a beta blocker.  As losartan is currently on recall, I will change this to candesartan.  -Start Lasix 40 mg daily times 3 days, then continue 20 mg daily  -BMET today, repeat 1 week  -DC losartan  -Start candesartan 16 mg daily  #2.  Coronary artery disease involving native coronary artery of native heart without angina pectoris History of CABG and redo CABG.  Most recently, he underwent drug-eluting stent to the native left main and native LCx.  He is doing well without anginal symptoms.  As he is off Apixaban, we will continue aspirin and Plavix.  Continue statin.  #3.  Atrial flutter, unspecified type ([place]) Maintaining normal sinus rhythm.  I reviewed his case today with Dr. Tamala Julian who also saw the patient.  Given his recent diagnosis of esophageal carcinoma and symptomatic anemia, we will hold off on resuming Eliquis at this point in time.  Hopefully we can resume it at some point in the next month or so.  If he has recurrent atrial fibrillation/flutter, we will need to consider starting him on amiodarone to control his rhythm.    #4.  Essential hypertension As noted, losartan will be changed to candesartan given the current recall.  #5.  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  Obtain follow-up CBC today.  He has follow-up PET scan pending later this week.  He will then follow-up with oncology and gastroenterology.   Dispo:  Return in about 1 month (around 10/13/2017) for Close Follow Up with Dr. Tamala Julian.   Medication Adjustments/Labs and Tests Ordered: Current medicines are reviewed at length with the patient today.  Concerns regarding medicines are outlined above.  Tests Ordered: Orders Placed This Encounter    Procedures  . Basic Metabolic Panel (BMET)  . CBC  . Basic Metabolic Panel (BMET)  . EKG 12-Lead   Medication Changes: Meds ordered this encounter  Medications  . candesartan (ATACAND) 16 MG tablet    Sig: Take 1 tablet (16 mg total) by mouth daily.  Dispense:  90 tablet    Refill:  3  . furosemide (LASIX) 20 MG tablet    Sig: Take 1 tablet (20 mg total) by mouth as directed. 40 MG DAILY FOR 3 DAYS; THEN CHANGE TO 20 MG DAILY    Dispense:  90 tablet    Refill:  3    Signed, Richardson Dopp, PA-C  09/13/2017 [facility] [address], [place], [place]  [address] Phone: [phone]; Fax: [phone]

## 2017-09-13 NOTE — Patient Instructions (Signed)
Medication Instructions:  1. STOP LOSARTAN  2. START CANDESARTAN 16 MG DAILY ; RX HAS BEEN SENT IN  3. START LASIX 40 MG DAILY FOR 3 DAYS THEN CHANGE LASIX 20 MG DAILY ; RX HAS BEEN SENT   Labwork: 1. TODAY BMET, CBC  2. BMET TO BE DONE IN 1 WEEK  Testing/Procedures: NONE ORDERED TODAY  Follow-Up: DR. Tamala Julian IN 1 MONTH; I WILL HAVE DR. SMITH'S NURSE JENNIFER CALL YOU WITH AN APPT .   Any Other Special Instructions Will Be Listed Below (If Applicable).     If you need a refill on your cardiac medications before your next appointment, please call your pharmacy.

## 2017-09-14 LAB — BASIC METABOLIC PANEL
BUN/Creatinine Ratio: 10 (ref 10–24)
BUN: 12 mg/dL (ref 8–27)
CALCIUM: 9.2 mg/dL (ref 8.6–10.2)
CHLORIDE: 104 mmol/L (ref 96–106)
CO2: 21 mmol/L (ref 20–29)
Creatinine, Ser: 1.23 mg/dL (ref 0.76–1.27)
GFR calc non Af Amer: 60 mL/min/{1.73_m2} (ref >59)
GFR, EST AFRICAN AMERICAN: 69 mL/min/{1.73_m2} (ref >59)
Glucose: 117 mg/dL — ABNORMAL HIGH (ref 65–99)
POTASSIUM: 4.4 mmol/L (ref 3.5–5.2)
Sodium: 142 mmol/L (ref 134–144)

## 2017-09-14 LAB — CBC
HEMATOCRIT: 32.2 % — AB (ref 37.5–51.0)
HEMOGLOBIN: 9.5 g/dL — AB (ref 13.0–17.7)
MCH: 22.5 pg — ABNORMAL LOW (ref 26.6–33.0)
MCHC: 29.5 g/dL — ABNORMAL LOW (ref 31.5–35.7)
MCV: 76 fL — ABNORMAL LOW (ref 79–97)
Platelets: 462 10*3/uL — ABNORMAL HIGH (ref 150–379)
RBC: 4.23 x10E6/uL (ref 4.14–5.80)
RDW: 19.5 % — ABNORMAL HIGH (ref 12.3–15.4)
WBC: 7.9 10*3/uL (ref 3.4–10.8)

## 2017-09-16 ENCOUNTER — Encounter
Admission: RE | Admit: 2017-09-16 | Discharge: 2017-09-16 | Disposition: A | Discharge status: Home / Self Care | Payer: Medicare HMO | Source: Ambulatory Visit | Attending: Oncology | Admitting: Oncology

## 2017-09-16 DIAGNOSIS — C155 Malignant neoplasm of lower third of esophagus: Secondary | ICD-10-CM | POA: Diagnosis not present

## 2017-09-16 DIAGNOSIS — C159 Malignant neoplasm of esophagus, unspecified: Secondary | ICD-10-CM | POA: Diagnosis not present

## 2017-09-16 LAB — GLUCOSE, CAPILLARY: GLUCOSE-CAPILLARY: 145 mg/dL — AB (ref 65–99)

## 2017-09-16 MED ORDER — FLUDEOXYGLUCOSE F - 18 (FDG) INJECTION
12.4600 | Freq: Once | INTRAVENOUS | Status: AC | PRN
  Administered 2017-09-16: 12.46 via INTRAVENOUS

## 2017-09-20 ENCOUNTER — Telehealth: Payer: Self-pay | Admitting: Oncology

## 2017-09-20 NOTE — Telephone Encounter (Signed)
Scheduled appt per 4/4 sch msg - left voicemail for patient regarding appts.

## 2017-09-21 ENCOUNTER — Other Ambulatory Visit: Payer: Self-pay | Admitting: Gastroenterology

## 2017-09-21 ENCOUNTER — Telehealth: Payer: Self-pay | Admitting: Interventional Cardiology

## 2017-09-21 ENCOUNTER — Other Ambulatory Visit: Payer: Medicare HMO

## 2017-09-21 DIAGNOSIS — C159 Malignant neoplasm of esophagus, unspecified: Secondary | ICD-10-CM | POA: Diagnosis not present

## 2017-09-21 DIAGNOSIS — E785 Hyperlipidemia, unspecified: Secondary | ICD-10-CM | POA: Diagnosis not present

## 2017-09-21 DIAGNOSIS — Z8679 Personal history of other diseases of the circulatory system: Secondary | ICD-10-CM | POA: Diagnosis not present

## 2017-09-21 NOTE — Telephone Encounter (Signed)
New message   Last seen 09/13/17     [facility] Pre-operative Risk Assessment    Request for surgical clearance:  1. What type of surgery is being performed? EUS and upper endoscopy   2. When is this surgery scheduled? 09/29/17  3. What type of clearance is required (medical clearance vs. Pharmacy clearance to hold med vs. Both)?  Pharmacy   4. Are there any medications that need to be held prior to surgery and how long? plavix for 5 days   5. Practice name and name of physician performing surgery?  [facility] Sil[facility]  6. What is your office phone and fax number? [phone]  Fax [phone]   7. Anesthesia type (None, local, MAC, general) ? Monitored anesthesia    Justin Blackburn 09/21/2017, 1:34 PM  _________________________________________________________________   (provider comments below)

## 2017-09-22 ENCOUNTER — Other Ambulatory Visit: Payer: Self-pay | Admitting: Cardiology

## 2017-09-22 LAB — LIPID PANEL
Chol/HDL Ratio: 4.2 ratio (ref 0.0–5.0)
Cholesterol, Total: 129 mg/dL (ref 100–199)
HDL: 31 mg/dL — ABNORMAL LOW (ref >39)
LDL CALC: 70 mg/dL (ref 0–99)
Triglycerides: 141 mg/dL (ref 0–149)
VLDL CHOLESTEROL CAL: 28 mg/dL (ref 5–40)

## 2017-09-22 LAB — HEPATIC FUNCTION PANEL
ALT: 9 IU/L (ref 0–44)
AST: 14 IU/L (ref 0–40)
Albumin: 4.2 g/dL (ref 3.6–4.8)
Alkaline Phosphatase: 79 IU/L (ref 39–117)
Bilirubin Total: 0.5 mg/dL (ref 0.0–1.2)
Bilirubin, Direct: 0.18 mg/dL (ref 0.00–0.40)
TOTAL PROTEIN: 6.9 g/dL (ref 6.0–8.5)

## 2017-09-22 NOTE — Telephone Encounter (Signed)
Left message with RN for Dr. Paulita Fujita regarding need to continue Plavix for at least 3 mos post PCI before holding (earliest could be held is 11/07/17) and he would need to remain on ASA without interruption.  Patient has appointment with Dr. Tamala Julian 10/19/17.  Asked Dr. Paulita Fujita to discuss with Dr. Tamala Julian further if needed. Richardson Dopp, PA-C    09/22/2017 4:37 PM

## 2017-09-22 NOTE — Telephone Encounter (Signed)
Need to make it to 3 months before Plavix can be stopped.  He would need to continue aspirin.

## 2017-09-22 NOTE — Telephone Encounter (Signed)
Needs to get from PCP.  Not a cardiac med.

## 2017-09-22 NOTE — Telephone Encounter (Signed)
   Primary Cardiologist: [name], MD  **Please route back to CV DIV PREOP**   Dr. Tamala Julian:  Request is to hold Plavix x 5 days for endoscopy.  But, patient is only 2 mos out from PCI to [facility] and LCx with drug eluting stents.  Please comment.  Chart reviewed as part of pre-operative protocol coverage.   The patient is a 67 y.o. male with coronary artery disease status post CABG in 1998 with redo bypass in 2007 and subsequent stenting of the obtuse marginal in 2013.  Other history includes hypertension, diabetes, hyperlipidemia, paroxysmal atrial fibrillation/flutter s/p DCCV in 2016.  He underwent Cardiac Catheterization in 07/2017 secondary to class III angina.  This demonstrated three-vessel CAD with total occlusion of the RCA and LAD and severe stenosis of the native left main and LCx.  He underwent PCI with drug-eluting stent to the left main and drug-eluting stent to the LCx on 08/10/17.  He was last seen 09/13/17.  He was on Apixaban and Plavix, but Apixaban was stopped due to bleeding from esophageal cancer.  He is therefore back on ASA and Plavix.    As he is < 6 mos out from PCI to the left main and LCx with a drug eluting stent, I will defer to Dr. Tamala Julian regarding recommendations for Plavix.  Richardson Dopp, PA-C 09/22/2017, 3:48 PM

## 2017-09-24 NOTE — Progress Notes (Signed)
Messaged received from Dr. Paulita Fujita nurse that pt. Can proceed with procedure on plavix and asa.

## 2017-09-27 ENCOUNTER — Encounter (HOSPITAL_COMMUNITY): Payer: Self-pay | Admitting: *Deleted

## 2017-09-27 ENCOUNTER — Telehealth: Payer: Self-pay | Admitting: Oncology

## 2017-09-27 ENCOUNTER — Other Ambulatory Visit: Payer: Self-pay

## 2017-09-27 ENCOUNTER — Telehealth: Payer: Self-pay | Admitting: *Deleted

## 2017-09-27 NOTE — Telephone Encounter (Signed)
New Message:     Pt is calling to verify if it is okay for him to stay off of his Plavix per req of the clearance. The decision has been made and told to the pt but pt states he doesn't remember if they said yes or no

## 2017-09-27 NOTE — Telephone Encounter (Signed)
Left message for patient regarding voicemail regarding upcoming April appointments.

## 2017-09-27 NOTE — Telephone Encounter (Signed)
This nurse called patient and informed her that the appointment for 09/28/17 is going to be cancelled, per Dr. Alen Blew, because we need test results from Dr. Erlinda Hong procedure that is being performed on 09/29/17. Once Dr. Alen Blew has the test results, we will reschedule his appointment. She verbalized understanding.

## 2017-09-27 NOTE — Telephone Encounter (Signed)
Spoke with pt and made him aware that he would be unable to hold Plavix until after 11/07/17 per Richardson Dopp, PA-C.  Pt verbalized understanding and was appreciative for call.

## 2017-09-28 ENCOUNTER — Other Ambulatory Visit: Payer: Self-pay | Admitting: Gastroenterology

## 2017-09-28 ENCOUNTER — Ambulatory Visit: Payer: Medicare HMO | Admitting: Oncology

## 2017-09-29 ENCOUNTER — Encounter (HOSPITAL_COMMUNITY)
Admission: RE | Disposition: A | Discharge status: Home / Self Care | Payer: Self-pay | Source: Ambulatory Visit | Attending: Gastroenterology

## 2017-09-29 ENCOUNTER — Ambulatory Visit (HOSPITAL_COMMUNITY): Payer: Medicare HMO | Admitting: Certified Registered"

## 2017-09-29 ENCOUNTER — Other Ambulatory Visit: Payer: Self-pay

## 2017-09-29 ENCOUNTER — Ambulatory Visit (HOSPITAL_COMMUNITY)
Admission: RE | Admit: 2017-09-29 | Discharge: 2017-09-29 | Disposition: A | Discharge status: Home / Self Care | Payer: Medicare HMO | Source: Ambulatory Visit | Attending: Gastroenterology | Admitting: Gastroenterology

## 2017-09-29 ENCOUNTER — Encounter (HOSPITAL_COMMUNITY): Payer: Self-pay | Admitting: Certified Registered"

## 2017-09-29 DIAGNOSIS — E119 Type 2 diabetes mellitus without complications: Secondary | ICD-10-CM | POA: Diagnosis not present

## 2017-09-29 DIAGNOSIS — I2510 Atherosclerotic heart disease of native coronary artery without angina pectoris: Secondary | ICD-10-CM | POA: Diagnosis not present

## 2017-09-29 DIAGNOSIS — Z794 Long term (current) use of insulin: Secondary | ICD-10-CM | POA: Insufficient documentation

## 2017-09-29 DIAGNOSIS — I10 Essential (primary) hypertension: Secondary | ICD-10-CM | POA: Diagnosis not present

## 2017-09-29 DIAGNOSIS — I252 Old myocardial infarction: Secondary | ICD-10-CM | POA: Insufficient documentation

## 2017-09-29 DIAGNOSIS — Z7982 Long term (current) use of aspirin: Secondary | ICD-10-CM | POA: Insufficient documentation

## 2017-09-29 DIAGNOSIS — Z87891 Personal history of nicotine dependence: Secondary | ICD-10-CM | POA: Diagnosis not present

## 2017-09-29 DIAGNOSIS — Z7902 Long term (current) use of antithrombotics/antiplatelets: Secondary | ICD-10-CM | POA: Diagnosis not present

## 2017-09-29 DIAGNOSIS — K228 Other specified diseases of esophagus: Secondary | ICD-10-CM | POA: Diagnosis not present

## 2017-09-29 DIAGNOSIS — C159 Malignant neoplasm of esophagus, unspecified: Secondary | ICD-10-CM | POA: Insufficient documentation

## 2017-09-29 DIAGNOSIS — Z955 Presence of coronary angioplasty implant and graft: Secondary | ICD-10-CM | POA: Insufficient documentation

## 2017-09-29 DIAGNOSIS — Z79899 Other long term (current) drug therapy: Secondary | ICD-10-CM | POA: Diagnosis not present

## 2017-09-29 DIAGNOSIS — Z951 Presence of aortocoronary bypass graft: Secondary | ICD-10-CM | POA: Insufficient documentation

## 2017-09-29 DIAGNOSIS — C160 Malignant neoplasm of cardia: Secondary | ICD-10-CM | POA: Diagnosis not present

## 2017-09-29 HISTORY — PX: EUS: SHX5427

## 2017-09-29 HISTORY — DX: Atherosclerotic heart disease of native coronary artery without angina pectoris: I25.10

## 2017-09-29 LAB — GLUCOSE, CAPILLARY: GLUCOSE-CAPILLARY: 125 mg/dL — AB (ref 65–99)

## 2017-09-29 SURGERY — UPPER ENDOSCOPIC ULTRASOUND (EUS) LINEAR
Anesthesia: Monitor Anesthesia Care

## 2017-09-29 MED ORDER — PROPOFOL 10 MG/ML IV BOLUS
INTRAVENOUS | Status: AC
Start: 2017-09-29 — End: ?
  Filled 2017-09-29: qty 40

## 2017-09-29 MED ORDER — LIDOCAINE 2% (20 MG/ML) 5 ML SYRINGE
INTRAMUSCULAR | Status: DC | PRN
  Administered 2017-09-29: 100 mg via INTRAVENOUS

## 2017-09-29 MED ORDER — LACTATED RINGERS IV SOLN
INTRAVENOUS | Status: DC | PRN
  Administered 2017-09-29: 09:00:00 via INTRAVENOUS

## 2017-09-29 MED ORDER — PROPOFOL 500 MG/50ML IV EMUL
INTRAVENOUS | Status: DC | PRN
  Administered 2017-09-29: 125 ug/kg/min via INTRAVENOUS

## 2017-09-29 MED ORDER — PROPOFOL 10 MG/ML IV BOLUS
INTRAVENOUS | Status: DC | PRN
  Administered 2017-09-29: 40 mg via INTRAVENOUS

## 2017-09-29 NOTE — Discharge Instructions (Signed)
Esophagogastroduodenoscopy, Care After °Refer to this sheet in the next few weeks. These instructions provide you with information about caring for yourself after your procedure. Your health care provider may also give you more specific instructions. Your treatment has been planned according to current medical practices, but problems sometimes occur. Call your health care provider if you have any problems or questions after your procedure. °What can I expect after the procedure? °After the procedure, it is common to have: °· A sore throat. °· Nausea. °· Bloating. °· Dizziness. °· Fatigue. ° °Follow these instructions at home: °· Do not eat or drink anything until the numbing medicine (local anesthetic) has worn off and your gag reflex has returned. You will know that the local anesthetic has worn off when you can swallow comfortably. °· Do not drive for 24 hours if you received a medicine to help you relax (sedative). °· If your health care provider took a tissue sample for testing during the procedure, make sure to get your test results. This is your responsibility. Ask your health care provider or the department performing the test when your results will be ready. °· Keep all follow-up visits as told by your health care provider. This is important. °Contact a health care provider if: °· You cannot stop coughing. °· You are not urinating. °· You are urinating less than usual. °Get help right away if: °· You have trouble swallowing. °· You cannot eat or drink. °· You have throat or chest pain that gets worse. °· You are dizzy or light-headed. °· You faint. °· You have nausea or vomiting. °· You have chills. °· You have a fever. °· You have severe abdominal pain. °· You have black, tarry, or bloody stools. °This information is not intended to replace advice given to you by your health care provider. Make sure you discuss any questions you have with your health care provider. °Document Released: 05/18/2012 Document  Revised: 11/07/2015 Document Reviewed: 04/25/2015 °Elsevier Interactive Patient Education © 2018 [employer] Inc. ° °

## 2017-09-29 NOTE — Op Note (Signed)
[facility] Patient Name: Justin Blackburn Procedure Date: 09/29/2017 MRN: [id number] Attending MD: Arta Silence , MD Date of Birth: 02/11/1951 CSN: [id number] Age: 67 Admit Type: Outpatient Procedure:                Upper EUS Indications:              Esophageal mucosal mass/polyp found on endoscopy,                            Staging of esophageal adenocarcinoma, Pre-treatment                            staging of esophageal adenocarcinoma Providers:                Arta Silence, MD, Vista Lawman, RN Referring MD:             Otis Brace, MD Medicines:                Monitored Anesthesia Care Complications:            No immediate complications. Estimated Blood Loss:     Estimated blood loss was minimal. Procedure:                Pre-Anesthesia Assessment:                           - Prior to the procedure, a History and Physical                            was performed, and patient medications and                            allergies were reviewed. The patient's tolerance of                            previous anesthesia was also reviewed. The risks                            and benefits of the procedure and the sedation                            options and risks were discussed with the patient.                            All questions were answered, and informed consent                            was obtained. Prior Anticoagulants: The patient has                            taken Plavix (clopidogrel), last dose was 1 day                            prior to procedure. ASA Grade Assessment: III - A  patient with severe systemic disease. After                            reviewing the risks and benefits, the patient was                            deemed in satisfactory condition to undergo the                            procedure.                           After obtaining informed consent, the endoscope was   passed under direct vision. Throughout the                            procedure, the patient's blood pressure, pulse, and                            oxygen saturations were monitored continuously. The                            was introduced through the mouth, and advanced to                            the antrum of the stomach. The upper EUS was                            accomplished without difficulty. The patient                            tolerated the procedure well. Scope In: Scope Out: Findings:      ENDOSCOPIC FINDING: :      A large, fungating and ulcerating mass with small bleeding upon direct       contact was found at the gastroesophageal junction with extension into       the cardia. The mass was partially obstructing and partially       circumferential (involving two thirds of the lumen circumference).      ENDOSONOGRAPHIC FINDING: :      There was no sign of significant endosonographic abnormality in the left       lobe of the liver.      There was no sign of significant endosonographic abnormality in the       pancreatic body and pancreatic tail.      No lymphadenopathy seen.      A heterogenous mass was found in the gastroesophageal junction. The       lesion was partially circumferential (involving 70% of the lumen). The       endosonographic borders were irregular. There was sonographic evidence       suggesting invasion into the muscularis propria (Layer 4) diffusely, and       several patches where there was invasion through the muscularis propria.       No neighboring adenopathy seen. No celiac adenopathy noted. Impression:               - Partially obstructing, malignant esophageal tumor  was found at the gastroesophageal junction with                            extension into the cardia.                           - There was no evidence of significant pathology in                            the left lobe of the liver.                            - There was no sign of significant pathology in the                            pancreatic body and pancreatic tail.                           - No vascular involvement of celiac artery.                           - A mass was found in the gastroesophageal                            junction. A tissue diagnosis was obtained prior to                            this exam. This is of adenocarcinoma. This was                            staged T3 N0 Mx by endosonographic criteria. Moderate Sedation:      N/A- Per Anesthesia Care Recommendation:           - Discharge patient to home (via wheelchair).                           - Resume previous diet today.                           - Return to GI clinic PRN.                           - Based on EUS appearance, patient would likely                            best benefit from upfront neoadjuvant                            chemoradiative therapy prior to consideration of                            surgical resection.                           - Will refer to an oncologist at appointment  to be                            scheduled. Procedure Code(s):        --- Professional ---                           [phone], Esophagogastroduodenoscopy, flexible,                            transoral; with endoscopic ultrasound examination,                            including the esophagus, stomach, and either the                            duodenum or a surgically altered stomach where the                            jejunum is examined distal to the anastomosis Diagnosis Code(s):        --- Professional ---                           C16.0, Malignant neoplasm of cardia                           K22.8, Other specified diseases of esophagus                           C15.9, Malignant neoplasm of esophagus, unspecified CPT copyright 2017 American Medical Association. All rights reserved. The codes documented in this report are preliminary and upon coder review  may  be revised to meet current compliance requirements. Arta Silence, MD 09/29/2017 9:35:47 AM This report has been signed electronically. Number of Addenda: 0

## 2017-09-29 NOTE — Anesthesia Postprocedure Evaluation (Signed)
Anesthesia Post Note  Patient: Datrell Dunton.  Procedure(s) Performed: UPPER ENDOSCOPIC ULTRASOUND (EUS) LINEAR (N/A )     Patient location during evaluation: PACU Anesthesia Type: MAC Level of consciousness: awake and alert Pain management: pain level controlled Vital Signs Assessment: post-procedure vital signs reviewed and stable Respiratory status: spontaneous breathing, nonlabored ventilation, respiratory function stable and patient connected to nasal cannula oxygen Cardiovascular status: stable and blood pressure returned to baseline Postop Assessment: no apparent nausea or vomiting Anesthetic complications: no    Last Vitals:  Vitals:   09/29/17 0940 09/29/17 0950  BP: (!) 141/84   Pulse: 72 69  Resp: (!) 26 (!) 21  Temp:    SpO2: 96% 93%    Last Pain:  Vitals:   09/29/17 0940  PainSc: 0-No pain                 Montez Hageman

## 2017-09-29 NOTE — Transfer of Care (Signed)
2222Immediate Anesthesia Transfer of Care Note  Patient: Justin Blackburn.  Procedure(s) Performed: UPPER ENDOSCOPIC ULTRASOUND (EUS) LINEAR (N/A )  Patient Location: PACU  Anesthesia Type:MAC  Level of Consciousness: awake, alert  and oriented  Airway & Oxygen Therapy: Patient Spontanous Breathing and Patient connected to nasal cannula oxygen  Post-op Assessment: Report given to RN and Post -op Vital signs reviewed and stable  Post vital signs: Reviewed and stable  Last Vitals:  Vitals Value Taken Time  BP 138/81 09/29/2017  9:34 AM  Temp    Pulse 79 09/29/2017  9:34 AM  Resp 15 09/29/2017  9:34 AM  SpO2 96 % 09/29/2017  9:34 AM  Vitals shown include unvalidated device data.  Last Pain:  Vitals:   09/29/17 0809  PainSc: 0-No pain         Complications: No apparent anesthesia complications

## 2017-09-29 NOTE — H&P (Signed)
Patient interval history reviewed.  Patient examined again.  There has been no change from documented H/P dated 09/21/17 (scanned into chart from our office) except as documented above.  Assessment:  1.  Esophageal adenocarcinoma. 2.  Chronic anticoagulation, clopidigrel for recent heart stent, remains on this medication for procedure.  Plan:  1.  Endoscopic ultrasound. 2.  Risks (bleeding, infection, bowel perforation that could require surgery, sedation-related changes in cardiopulmonary systems), benefits (identification and possible treatment of source of symptoms, exclusion of certain causes of symptoms), and alternatives (watchful waiting, radiographic imaging studies, empiric medical treatment) of upper endoscopy with ultrasound (EUS) were explained to patient/family in detail and patient wishes to proceed.

## 2017-09-29 NOTE — Anesthesia Preprocedure Evaluation (Signed)
Anesthesia Evaluation  Patient identified by MRN, date of birth, ID band Patient awake    Reviewed: Allergy & Precautions, NPO status , Patient's Chart, lab work & pertinent test results  Airway Mallampati: II  TM Distance: >3 FB Neck ROM: Full    Dental no notable dental hx.    Pulmonary former smoker,    Pulmonary exam normal breath sounds clear to auscultation       Cardiovascular hypertension, + CAD, + Past MI and + Cardiac Stents  Normal cardiovascular exam+ dysrhythmias Atrial Fibrillation  Rhythm:Regular Rate:Normal     Neuro/Psych negative neurological ROS  negative psych ROS   GI/Hepatic negative GI ROS, Neg liver ROS,   Endo/Other  diabetes, Type 2, Insulin Dependent, Oral Hypoglycemic Agents  Renal/GU negative Renal ROS  negative genitourinary   Musculoskeletal negative musculoskeletal ROS (+)   Abdominal   Peds negative pediatric ROS (+)  Hematology negative hematology ROS (+)   Anesthesia Other Findings   Reproductive/Obstetrics negative OB ROS                             Anesthesia Physical Anesthesia Plan  ASA: III  Anesthesia Plan: MAC   Post-op Pain Management:    Induction:   PONV Risk Score and Plan: 1  Airway Management Planned: Nasal Cannula  Additional Equipment:   Intra-op Plan:   Post-operative Plan:   Informed Consent: I have reviewed the patients History and Physical, chart, labs and discussed the procedure including the risks, benefits and alternatives for the proposed anesthesia with the patient or authorized representative who has indicated his/her understanding and acceptance.   Dental advisory given  Plan Discussed with:   Anesthesia Plan Comments:         Anesthesia Quick Evaluation

## 2017-10-05 ENCOUNTER — Encounter: Payer: Self-pay | Admitting: *Deleted

## 2017-10-05 ENCOUNTER — Other Ambulatory Visit: Payer: Medicare HMO

## 2017-10-06 ENCOUNTER — Inpatient Hospital Stay: Payer: Medicare HMO | Attending: Oncology | Admitting: Oncology

## 2017-10-06 ENCOUNTER — Telehealth: Payer: Self-pay | Admitting: Oncology

## 2017-10-06 VITALS — BP 151/89 | HR 92 | Temp 98.0°F | Resp 17 | Ht 71.0 in | Wt 221.7 lb

## 2017-10-06 DIAGNOSIS — Z794 Long term (current) use of insulin: Secondary | ICD-10-CM | POA: Insufficient documentation

## 2017-10-06 DIAGNOSIS — Z7982 Long term (current) use of aspirin: Secondary | ICD-10-CM

## 2017-10-06 DIAGNOSIS — Z79899 Other long term (current) drug therapy: Secondary | ICD-10-CM | POA: Insufficient documentation

## 2017-10-06 DIAGNOSIS — D509 Iron deficiency anemia, unspecified: Secondary | ICD-10-CM

## 2017-10-06 DIAGNOSIS — I4892 Unspecified atrial flutter: Secondary | ICD-10-CM | POA: Diagnosis not present

## 2017-10-06 DIAGNOSIS — C155 Malignant neoplasm of lower third of esophagus: Secondary | ICD-10-CM

## 2017-10-06 DIAGNOSIS — C160 Malignant neoplasm of cardia: Secondary | ICD-10-CM | POA: Diagnosis not present

## 2017-10-06 NOTE — Progress Notes (Signed)
Hematology and Oncology Follow Up Visit  Justin Blackburn [id number] 24-Dec-1950 67 y.o. 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  His EUS stage is T3N0 with staging workup showed no metastatic disease.   Prior Therapy: He is status post endoscopy and a biopsy in March 2019.  He underwent EUS for staging purposes on 09/29/2017.  Current therapy: Under evaluation for definitive therapy.  Interim History: Justin Blackburn presents today for a follow-up visit.  He is a pleasant gentleman I saw in consultation in March 2019 with a new diagnosis of GE junction adenocarcinoma.  He presented with GI bleeding which resulted in atrial flutter and rapid ventricular response and his other workup revealed iron deficiency anemia from GI bleed related to his tumor.  Since his hospitalization in March 2019, he has been feeling reasonably well.  He denied any recent hospitalization or illnesses.  He denies any palpitation.  He denies any hematochezia or melena.  He has been eating well and performs activities of daily living.  He does not report any headaches, blurry vision, syncope or seizures. Does not report any fevers, chills or sweats.  Does not report any cough, wheezing or hemoptysis.  Does not report any chest pain, palpitation, orthopnea or leg edema.  Does not report any nausea, vomiting or abdominal pain.  Does not report any constipation or diarrhea.  Does not report any skeletal complaints.    Does not report frequency, urgency or hematuria.  Does not report any skin rashes or lesions. Does not report any heat or cold intolerance.  Does not report any lymphadenopathy or petechiae.  Does not report any anxiety or depression.  Remaining review of systems is negative.    Medications: I have reviewed the patient's current medications.  Current Outpatient Medications   Medication Sig Dispense Refill  . acetaminophen (TYLENOL) 325 MG tablet Take 2 tablets (650 mg total) by mouth every 6 (six) hours as needed for mild pain or headache.    [name] aspirin EC 81 MG EC tablet Take 1 tablet (81 mg total) by mouth daily.    [name] atorvastatin (LIPITOR) 40 MG tablet Take 40 mg by mouth daily at 6 PM.   3  . candesartan (ATACAND) 16 MG tablet Take 1 tablet (16 mg total) by mouth daily. 90 tablet 3  . clopidogrel (PLAVIX) 75 MG tablet Take 1 tablet (75 mg total) by mouth daily. Take for 6 months. Plan to stop on August 27th. Check with cardiologist at that time. 30 tablet 5  . diltiazem (CARDIZEM CD) 120 MG 24 hr capsule Take 1 capsule (120 mg total) by mouth daily. 30 capsule 11  . ferrous sulfate 325 (65 FE) MG tablet Take 325 mg by mouth daily.  3  . furosemide (LASIX) 20 MG tablet Take 1 tablet (20 mg total) by mouth as directed. 40 MG DAILY FOR 3 DAYS; THEN CHANGE TO 20 MG DAILY 90 tablet 3  . gabapentin (NEURONTIN) 300 MG capsule Take 300 mg by mouth 2 (two) times daily.  1  . LANTUS SOLOSTAR 100 UNIT/ML Solostar Pen Inject 50 Units into the skin daily.   3  . nitroGLYCERIN (NITROSTAT) 0.4 MG SL tablet Place 1 tablet (0.4 mg total) under the tongue every 5 (five) minutes as needed for chest pain (Angina). 25 tablet 3  . pantoprazole (PROTONIX) 40 MG tablet Take 1 tablet (40 mg total) by  mouth daily. 30 tablet 11  . sitaGLIPtin (JANUVIA) 100 MG tablet Take 100 mg by mouth daily.    . sucralfate (CARAFATE) 1 GM/10ML suspension Take 10 mLs (1 g total) by mouth 4 (four) times daily -  with meals and at bedtime. 420 mL 0   No current facility-administered medications for this visit.      Allergies: No Known Allergies  Past Medical History, Surgical history, Social history, and Family History were reviewed and updated.  Review of Systems:  Remaining ROS negative.  Physical Exam: Blood pressure (!) 151/89, pulse 92, temperature 98 F (36.7 C), temperature source Oral,  resp. rate 17, height 5\' 11"  (1.803 m), weight 221 lb 11.2 oz (100.6 kg), SpO2 98 %. ECOG: 1 General appearance: alert and cooperative appeared without distress. Head: Normocephalic, without obvious abnormality Oropharynx: No oral thrush or ulcers. Eyes: No scleral icterus.  Pupils are equal and round reactive to light. Lymph nodes: Cervical, supraclavicular, and axillary nodes normal. Heart:regular rate and rhythm, S1, S2 normal, no murmur, click, rub or gallop Lung:chest clear, no wheezing, rales, normal symmetric air entry Abdomin: soft, non-tender, without masses or organomegaly. Neurological: No motor, sensory deficits.  Intact deep tendon reflexes. Skin: No rashes or lesions.  No ecchymosis or petechiae. Musculoskeletal: No joint deformity or effusion. Psychiatric: Mood and affect are appropriate.    Lab Results: Lab Results  Component Value Date   WBC 7.9 09/13/2017   HGB 9.5 (L) 09/13/2017   HCT 32.2 (L) 09/13/2017   MCV 76 (L) 09/13/2017   PLT 462 (H) 09/13/2017     Chemistry      Component Value Date/Time   NA 142 09/13/2017 1233   K 4.4 09/13/2017 1233   CL 104 09/13/2017 1233   CO2 21 09/13/2017 1233   BUN 12 09/13/2017 1233   CREATININE 1.23 09/13/2017 1233      Component Value Date/Time   CALCIUM 9.2 09/13/2017 1233   ALKPHOS 79 09/21/2017 1352   AST 14 09/21/2017 1352   ALT 9 09/21/2017 1352   BILITOT 0.5 09/21/2017 1352       Radiological Studies:  EXAM: NUCLEAR MEDICINE PET SKULL BASE TO THIGH  TECHNIQUE: 12.46 mCi F-18 FDG was injected intravenously. Full-ring PET imaging was performed from the skull base to thigh after the radiotracer. CT data was obtained and used for attenuation correction and anatomic localization.  Fasting blood glucose: 145 mg/dl  COMPARISON:  None.  FINDINGS: Mediastinal blood pool activity: SUV max 2.41  NECK: No hypermetabolic lymph nodes in the neck.  Incidental CT findings: none  CHEST: Mild  hypermetabolism noted in the distal esophagus and along the GE junction consistent with patient's known cancer. SUV max is 5.2. No paraesophageal, mediastinal or upper abdominal adenopathy.  No hypermetabolic lung lesions. No worrisome pulmonary nodules on the CT scan.  Incidental CT findings: Moderate-sized bilateral pleural effusions with bibasilar atelectasis, right greater than left.  Cardiac enlargement with surgical changes from coronary artery bypass surgery. Extensive three-vessel coronary artery calcifications.  ABDOMEN/PELVIS: No abnormal hypermetabolic activity within the liver, pancreas, adrenal glands, or spleen. No hypermetabolic lymph nodes in the abdomen or pelvis.  No enlarged or hypermetabolic gastrohepatic ligament, celiac axis or periportal lymph nodes.  Incidental CT findings: Cholelithiasis is noted. A right renal calculus is noted.  SKELETON: No focal hypermetabolic activity to suggest skeletal metastasis.  Incidental CT findings: none  IMPRESSION: 1. Mild-to-moderate hypermetabolism in the distal esophagus and along the GE junction. No paraesophageal, gastrohepatic ligament or celiac  axis adenopathy. No findings for hepatic metastatic disease. 2. No evidence of pulmonary metastatic disease. There are moderate bilateral pleural effusions with bibasilar atelectasis.  Impression and Plan:  67 year old gentleman with the following issues:  1.  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  He presented with iron deficiency anemia and required hospitalization because of atrial fibrillation and rapid ventricular response.  His staging workup including EUS as well as PET scan confirmed no evidence of distant metastasis.  The natural course of this disease as well as treatment options were reviewed today in detail.  These options would include a neoadjuvant chemotherapy followed by surgery versus definitive therapy with radiation  concomitantly with chemotherapy.  The rationale for using both options were reviewed today and he appears to be a marginal surgical candidate.  He has extensive coronary disease and continues to be on chronic anticoagulation predominantly Plavix.  After discussion today, we have elected to proceed with radiation therapy as a definitive approach with weekly chemotherapy.  The regimen of choice would be utilizing carboplatin and paclitaxel weekly.  Complication associated with this therapy include nausea, fatigue, myelosuppression, neutropenia, infusion related complications among others.  We will make the appropriate referral to radiation oncology and once his radiation appointments are set, will arrange for his chemotherapy infusion dates.  2.  IV access: We will use his peripheral veins so we do not have to interrupt his anticoagulation.  3.  Antiemetics: Appropriate antiemetics will be prescribed to him before the start of chemotherapy.  4.  Iron deficiency anemia: He remains on oral iron and will continue to monitor his iron studies on treatment.  Intravenous iron can be used in the future.  5.  Prognosis: Treatment goal is curative at this time.  His prognosis dictates on his response to therapy as well as computed comorbid additions.  6.  Follow-up: The next few weeks to follow his progress as he start treatment.  25  minutes was spent with the patient face-to-face today.  More than 50% of time was dedicated to patient counseling, education and coordination his multifaceted care.   Zola Button, MD 4/24/20193:30 PM

## 2017-10-06 NOTE — Telephone Encounter (Signed)
Appointments scheduled AVS/Calendar printed per 4/24 los

## 2017-10-07 ENCOUNTER — Encounter: Payer: Self-pay | Admitting: Radiation Oncology

## 2017-10-08 ENCOUNTER — Telehealth: Payer: Self-pay | Admitting: Oncology

## 2017-10-11 NOTE — Progress Notes (Signed)
Radiation Oncology         [phone]) [phone] ________________________________  Name: Justin Blackburn.        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 A diagnosis of Malignant neoplasm of lower third of esophagus (HCC) was also pertinent to this visit.   HISTORY OF PRESENT ILLNESS: Justin Roughton. is a 67 y.o. male seen at the request of Dr. Alen Blew for a new diagnosis of adenocarcinoma of the GE junction. He was hospitalized between 08/30/17 and 09/04/17 for A Flutter with RVR. He has a prior history of CABG and recent PCI with DES placement x 3 vessels in February 2019. He was found to have a low hgb during his hospitalization and EGD revealing a mass in the distal esophagus along the GE junction, and biopsy revealed adenocarcinoma.  He underwent EUS on 09/29/2017 revealing a T3N0 lesion in the distal esophagus, near obstructing, and no evidence of disease within the liver by ultrasound.  A PET scan on 09/16/2017 also was performed revealing mild hypermetabolism in the distal esophagus along the GE junction with an SUV max of 5.2, no paraesophageal mediastinal or upper abdominal adenopathy was identified.  No hypermetabolic lesions were seen in the lung, or worrisome pulmonary nodules on CT.  Moderate sized bilateral pleural effusions right greater than left were noted with cardiac enlargement consistent with his known history of  cardiovascular disease.  He comes today to discuss the options of concurrent chemoradiotherapy.  He has met with Dr. Alen Blew and has chemo education class tomorrow.  PREVIOUS RADIATION THERAPY: No   PAST MEDICAL HISTORY:  Past Medical History:  Diagnosis Date  . Angina   . Arthritis    "hands" (08/10/2017)  . Atrial flutter, paroxysmal ([place])    arrived with  aflutter with RVR, EF down to 25-30%, s/p TEE DCCV on 01/29/2015  . Chronic lower back pain    "all the time from my sciatic nerve"  . Coronary artery disease   . Erectile dysfunction   . GERD (gastroesophageal reflux disease)   . Heart failure ([place])   . Hypercholesteremia   . Hypertension   . 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 Myocardial infarction ([place]) 1988; ~3546;  ~1996;~ 2000  . Obesity   . Peripheral vascular disease ([place])   . Umbilical hernia    unrepaired       PAST SURGICAL HISTORY: Past Surgical History:  Procedure Laterality Date  . CARDIOVERSION N/A 01/29/2015   Procedure: CARDIOVERSION;  Surgeon: [name], MD;  Location: [place];  Service: Cardiovascular;  Laterality: N/A;  . CORONARY ANGIOPLASTY WITH STENT PLACEMENT  08/31/11   "2 today; makes total of 3 stents"  . CORONARY ANGIOPLASTY WITH STENT PLACEMENT  08/10/2017   "2 today; makes a total of 5 stents" (08/10/2017)  . CORONARY ARTERY BYPASS GRAFT  1998; 2007   CABG X 4; CABG X4  . CORONARY STENT INTERVENTION N/A 08/10/2017   Procedure: CORONARY STENT INTERVENTION;  Surgeon: Sherren Mocha, MD;  Location: [place] CV [facility];  Service: Cardiovascular;  Laterality: N/A;  . ESOPHAGOGASTRODUODENOSCOPY (EGD) WITH PROPOFOL N/A 09/01/2017   Procedure: ESOPHAGOGASTRODUODENOSCOPY (EGD) WITH PROPOFOL;  Surgeon: Laurence Spates, MD;  Location: [place];  Service: Endoscopy;  Laterality: N/A;  . EUS N/A 09/29/2017  Procedure: UPPER ENDOSCOPIC ULTRASOUND (EUS) LINEAR;  Surgeon: Arta Silence, MD;  Location: [facility];  Service: Endoscopy;  Laterality: N/A;  . LEFT HEART CATH AND CORS/GRAFTS ANGIOGRAPHY N/A 08/10/2017   Procedure: LEFT HEART CATH AND CORS/GRAFTS ANGIOGRAPHY;  Surgeon: Sherren Mocha, MD;  Location: [place] [facility];  Service: Cardiovascular;  Laterality: N/A;  . LEFT HEART CATHETERIZATION WITH CORONARY/GRAFT ANGIOGRAM N/A 08/31/2011   Procedure: LEFT HEART  CATHETERIZATION WITH Beatrix Fetters;  Surgeon: [name], MD;  Location[facility];  Service: Cardiovascular;  Laterality: N/A;  . PERIPHERAL VASCULAR CATHETERIZATION N/A 04/27/2016   Procedure: Abdominal Aortogram w/Lower Extremity;  Surgeon: Angelia Mould, MD;  Location: [place] [facility];  Service: Cardiovascular;  Laterality: N/A;  . TEE WITHOUT CARDIOVERSION N/A 01/29/2015   Procedure: TRANSESOPHAGEAL ECHOCARDIOGRAM (TEE);  Surgeon: [name], MD;  Location: [facility] ENDOSCOPY;  Service: Cardiovascular;  Laterality: N/A;     FAMILY HISTORY:  Family History  Problem Relation Age of Onset  . Heart disease Mother   . Heart attack Mother   . Diabetes Mother   . Heart disease Father   . Heart attack Father   . Diabetes Father   . Heart attack Brother   . Stroke Neg Hx      SOCIAL HISTORY:  reports that he quit smoking about 46 years ago. His smoking use included cigarettes. He has a 12.00 pack-year smoking history. He has never used smokeless tobacco. He reports that he does not drink alcohol or use drugs.   ALLERGIES: Patient has no known allergies.   MEDICATIONS:  Current Outpatient Medications  Medication Sig Dispense Refill  . acetaminophen (TYLENOL) 325 MG tablet Take 2 tablets (650 mg total) by mouth every 6 (six) hours as needed for mild pain or headache.    [name] aspirin EC 81 MG EC tablet Take 1 tablet (81 mg total) by mouth daily.    [name] atorvastatin (LIPITOR) 40 MG tablet Take 40 mg by mouth daily at 6 PM.   3  . candesartan (ATACAND) 16 MG tablet Take 1 tablet (16 mg total) by mouth daily. 90 tablet 3  . clopidogrel (PLAVIX) 75 MG tablet Take 1 tablet (75 mg total) by mouth daily. Take for 6 months. Plan to stop on August 27th. Check with cardiologist at that time. 30 tablet 5  . diltiazem (CARDIZEM CD) 120 MG 24 hr capsule Take 1 capsule (120 mg total) by mouth daily. 30 capsule 11  . ferrous sulfate 325 (65 FE) MG tablet Take 325 mg by mouth  daily.  3  . furosemide (LASIX) 20 MG tablet Take 1 tablet (20 mg total) by mouth as directed. 40 MG DAILY FOR 3 DAYS; THEN CHANGE TO 20 MG DAILY 90 tablet 3  . gabapentin (NEURONTIN) 300 MG capsule Take 300 mg by mouth 2 (two) times daily.  1  . LANTUS SOLOSTAR 100 UNIT/ML Solostar Blackburn Inject 50 Units into the skin daily.   3  . nitroGLYCERIN (NITROSTAT) 0.4 MG SL tablet Place 1 tablet (0.4 mg total) under the tongue every 5 (five) minutes as needed for chest pain (Angina). 25 tablet 3  . pantoprazole (PROTONIX) 40 MG tablet Take 1 tablet (40 mg total) by mouth daily. 30 tablet 11  . sitaGLIPtin (JANUVIA) 100 MG tablet Take 100 mg by mouth daily.    . sucralfate (CARAFATE) 1 GM/10ML suspension Take 10 mLs (1 g total) by mouth 4 (four) times daily -  with meals and at bedtime. [place]  mL 0   No current facility-administered medications for this encounter.      REVIEW OF SYSTEMS: On review of systems, the patient reports that he is doing well overall.  He denies any chest pain, shortness of breath, cough, fevers, chills, night sweats, unintended weight changes.  He reports he is able to eat whatever he chooses at this time and is not limited by textures of foods.  He states that he intentionally was trying to lose weight and has lost about 15 pounds due to his cardiac history.  He does state that he has hiccups after drinking liquids, and reports that he is not having any episodes of regurgitation.  Denies any bowel or bladder disturbances, and denies abdominal pain, nausea or vomiting. He denies any new musculoskeletal or joint aches or pains. A complete review of systems is obtained and is otherwise negative.     PHYSICAL EXAM:  Wt Readings from Last 3 Encounters:  10/12/17 220 lb 3.2 oz (99.9 kg)  10/06/17 221 lb 11.2 oz (100.6 kg)  09/29/17 237 lb (107.5 kg)   Temp Readings from Last 3 Encounters:  10/12/17 98.2 F (36.8 C) (Oral)  10/06/17 98 F (36.7 C) (Oral)  09/29/17 97.7 F (36.5 C)    BP Readings from Last 3 Encounters:  10/12/17 (!) 153/92  10/06/17 (!) 151/89  09/29/17 (!) 141/84   Pulse Readings from Last 3 Encounters:  10/12/17 78  10/06/17 92  09/29/17 69     In general this is a well appearing Caucasian male in no acute distress. He is alert and oriented x4 and appropriate throughout the examination. HEENT reveals that the patient is normocephalic, atraumatic. EOMs are intact. PERRLA. Skin is intact without any evidence of gross lesions. Cardiovascular exam reveals a regular rate and rhythm, no clicks rubs or murmurs are auscultated. Chest is clear to auscultation bilaterally. Lymphatic assessment is performed and does not reveal any adenopathy in the cervical, supraclavicular, axillary, or inguinal chains. Abdomen has active bowel sounds in all quadrants and is intact.  He has a reducible hernia at the umbilicus,  the abdomen is soft, non tender, non distended. Lower extremities are negative for pretibial pitting edema, deep calf tenderness, cyanosis or clubbing.   ECOG = 1  0 - Asymptomatic (Fully active, able to carry on all predisease activities without restriction)  1 - Symptomatic but completely ambulatory (Restricted in physically strenuous activity but ambulatory and able to carry out work of a light or sedentary nature. For example, light housework, office work)  2 - Symptomatic, <50% in bed during the day (Ambulatory and capable of all self care but unable to carry out any work activities. Up and about more than 50% of waking hours)  3 - Symptomatic, >50% in bed, but not bedbound (Capable of only limited self-care, confined to bed or chair 50% or more of waking hours)  4 - Bedbound (Completely disabled. Cannot carry on any self-care. Totally confined to bed or chair)  5 - Death   Justin Blackburn MM, Creech RH, Tormey DC, et al. [phone]). "Toxicity and response criteria of the [facility]". [place] Oncol. 5 (6):  [phone]    LABORATORY DATA:  Lab Results  Component Value Date   WBC 7.9 09/13/2017   HGB 9.5 (L) 09/13/2017   HCT 32.2 (L) 09/13/2017   MCV 76 (L) 09/13/2017   PLT 462 (H) 09/13/2017   Lab Results  Component Value Date   NA 142 09/13/2017  K 4.4 09/13/2017   CL 104 09/13/2017   CO2 21 09/13/2017   Lab Results  Component Value Date   ALT 9 09/21/2017   AST 14 09/21/2017   ALKPHOS 79 09/21/2017   BILITOT 0.5 09/21/2017      RADIOGRAPHY: Nm Pet Image Initial (pi) Skull Base To Thigh  Result Date: 09/16/2017 CLINICAL DATA:  Initial treatment strategy for esophageal cancer. EXAM: NUCLEAR MEDICINE PET SKULL BASE TO THIGH TECHNIQUE: 12.46 mCi F-18 FDG was injected intravenously. Full-ring PET imaging was performed from the skull base to thigh after the radiotracer. CT data was obtained and used for attenuation correction and anatomic localization. Fasting blood glucose: 145 mg/dl COMPARISON:  None. FINDINGS: Mediastinal blood pool activity: SUV max 2.41 NECK: No hypermetabolic lymph nodes in the neck. Incidental CT findings: none CHEST: Mild hypermetabolism noted in the distal esophagus and along the GE junction consistent with patient's known cancer. SUV max is 5.2. No paraesophageal, mediastinal or upper abdominal adenopathy. No hypermetabolic lung lesions. No worrisome pulmonary nodules on the CT scan. Incidental CT findings: Moderate-sized bilateral pleural effusions with bibasilar atelectasis, right greater than left. Cardiac enlargement with surgical changes from coronary artery bypass surgery. Extensive three-vessel coronary artery calcifications. ABDOMEN/PELVIS: No abnormal hypermetabolic activity within the liver, pancreas, adrenal glands, or spleen. No hypermetabolic lymph nodes in the abdomen or pelvis. No enlarged or hypermetabolic gastrohepatic ligament, celiac axis or periportal lymph nodes. Incidental CT findings: Cholelithiasis is noted. A right renal calculus is noted.  SKELETON: No focal hypermetabolic activity to suggest skeletal metastasis. Incidental CT findings: none IMPRESSION: 1. Mild-to-moderate hypermetabolism in the distal esophagus and along the GE junction. No paraesophageal, gastrohepatic ligament or celiac axis adenopathy. No findings for hepatic metastatic disease. 2. No evidence of pulmonary metastatic disease. There are moderate bilateral pleural effusions with bibasilar atelectasis. Electronically Signed   By: Marijo Sanes M.D.   On: 09/16/2017 15:15       IMPRESSION/PLAN: 1. Stage III, cT3N0M0 adenocarcinoma of the GE junction. Dr. Lisbeth Renshaw discusses the pathology findings and reviews the nature of adenocarcinoma of the esophagus, and reviews that this is only locally advanced.  He would recommend that the patient meet with Dr. Servando Snare to discuss whether or not he would be a candidate for surgery.  I had already reached out to Dr. Servando Snare who indicated that it was not prohibitive that he had recently had these procedures, but we need to see him to determine his response and consider the timing of his drug-eluting stent placement as it pertains to anticoagulation.  We discussed the risks, benefits, short, and long term effects of radiotherapy, and the patient is interested in proceeding. Dr. Lisbeth Renshaw discusses the delivery and logistics of radiotherapy and anticipates a course of 6 weeks of radiotherapy. We we will plan to proceed with simulation early next week in lieu of starting treatment the week of May 13. Written consent is obtained and placed in the chart, a copy was provided to the patient.   The above documentation reflects my direct findings during this shared patient visit. Please see the separate note by Dr. Lisbeth Renshaw on this date for the remainder of the patient's plan of care.    Carola Rhine, PAC

## 2017-10-11 NOTE — Progress Notes (Signed)
GI Location of Tumor / Histology: Malignant neoplasm of lower third of esophagus: GE junction adenocarcinoma  Justin Blackburn. presented last month with GI bleeding that resulted in atrial flutter and rapid ventricular response, his other workup revealed iron deficiency anemia from the GI bleed in relation to his tumor.  PET 09/16/2017: Mild to moderate hypermetabolism in the distal esophagus and along the GE junction, no paraesophageal, gastrohepatic ligament or celiac axis adenopathy.  No findings for hepatic metastatic disease.  No evidence of pulmonary metastatic disease.  There are moderate bilateral pleural effusions with bibasilar atelectasis.  Endoscopic Ultrasound 09/29/2017 Dr. Paulita Fujita: Confirmation- no evidence of distant metastasis.  Biopsies of Esophagogastric Junction 09/01/2017    Past/Anticipated interventions by surgeon, if any:   Past/Anticipated interventions by medical oncology, if any:  Dr. Alen Blew 10/06/2017 -The natural course of this disease as well as treatment options were reviewed today in detail.  These options would include a neoadjuvant chemotherapy followed by surgery versus definitive therapy with radiation concomitantly with chemotherapy. -After discussion today, we have elected to proceed with radiation therapy as a definitive approach with weekly chemotherapy.   -The regimen of choice would be utilizing carboplatin and paclitaxel weekly.   -We will make the appropriate referral to radiation oncology and once his radiation appointments are set, will arrange for his chemotherapy infusion dates. -Chemo education class 10/13/2017  Weight changes, if any: 15 pound weight loss over about 6 months.  Bowel/Bladder complaints, if any: No  Nausea / Vomiting, if any: No  Pain issues, if any:  No  Any blood per rectum:   No  BP (!) 153/92 (BP Location: Right Arm, Patient Position: Sitting, Cuff Size: Normal)   Pulse 78   Temp 98.2 F (36.8 C) (Oral)   Resp 20   Ht  5\' 11"  (1.803 m)   Wt 220 lb 3.2 oz (99.9 kg)   SpO2 95%   BMI 30.71 kg/m    Wt Readings from Last 3 Encounters:  10/12/17 220 lb 3.2 oz (99.9 kg)  10/06/17 221 lb 11.2 oz (100.6 kg)  09/29/17 237 lb (107.5 kg)    SAFETY ISSUES:  Prior radiation? No  Pacemaker/ICD? No  Possible current pregnancy? No  Is the patient on methotrexate? No  Current Complaints/Details:

## 2017-10-12 ENCOUNTER — Other Ambulatory Visit: Payer: Self-pay | Admitting: Radiation Oncology

## 2017-10-12 ENCOUNTER — Ambulatory Visit
Admission: RE | Admit: 2017-10-12 | Discharge: 2017-10-12 | Disposition: A | Discharge status: Home / Self Care | Payer: Medicare HMO | Source: Ambulatory Visit | Attending: Radiation Oncology | Admitting: Radiation Oncology

## 2017-10-12 ENCOUNTER — Other Ambulatory Visit: Payer: Self-pay

## 2017-10-12 ENCOUNTER — Encounter: Payer: Self-pay | Admitting: Radiation Oncology

## 2017-10-12 VITALS — BP 153/92 | HR 78 | Temp 98.2°F | Resp 20 | Ht 71.0 in | Wt 220.2 lb

## 2017-10-12 DIAGNOSIS — C155 Malignant neoplasm of lower third of esophagus: Secondary | ICD-10-CM

## 2017-10-12 DIAGNOSIS — K219 Gastro-esophageal reflux disease without esophagitis: Secondary | ICD-10-CM | POA: Insufficient documentation

## 2017-10-12 DIAGNOSIS — E669 Obesity, unspecified: Secondary | ICD-10-CM | POA: Diagnosis not present

## 2017-10-12 DIAGNOSIS — Z79899 Other long term (current) drug therapy: Secondary | ICD-10-CM | POA: Insufficient documentation

## 2017-10-12 DIAGNOSIS — Z7982 Long term (current) use of aspirin: Secondary | ICD-10-CM | POA: Diagnosis not present

## 2017-10-12 DIAGNOSIS — M129 Arthropathy, unspecified: Secondary | ICD-10-CM | POA: Insufficient documentation

## 2017-10-12 DIAGNOSIS — C159 Malignant neoplasm of esophagus, unspecified: Secondary | ICD-10-CM

## 2017-10-12 DIAGNOSIS — J90 Pleural effusion, not elsewhere classified: Secondary | ICD-10-CM | POA: Insufficient documentation

## 2017-10-12 DIAGNOSIS — I10 Essential (primary) hypertension: Secondary | ICD-10-CM | POA: Diagnosis not present

## 2017-10-12 DIAGNOSIS — I4892 Unspecified atrial flutter: Secondary | ICD-10-CM | POA: Insufficient documentation

## 2017-10-12 DIAGNOSIS — N200 Calculus of kidney: Secondary | ICD-10-CM | POA: Diagnosis not present

## 2017-10-12 DIAGNOSIS — I739 Peripheral vascular disease, unspecified: Secondary | ICD-10-CM | POA: Insufficient documentation

## 2017-10-12 DIAGNOSIS — E7800 Pure hypercholesterolemia, unspecified: Secondary | ICD-10-CM | POA: Insufficient documentation

## 2017-10-12 DIAGNOSIS — I252 Old myocardial infarction: Secondary | ICD-10-CM | POA: Insufficient documentation

## 2017-10-12 DIAGNOSIS — N529 Male erectile dysfunction, unspecified: Secondary | ICD-10-CM | POA: Diagnosis not present

## 2017-10-12 DIAGNOSIS — R599 Enlarged lymph nodes, unspecified: Secondary | ICD-10-CM | POA: Insufficient documentation

## 2017-10-12 DIAGNOSIS — C160 Malignant neoplasm of cardia: Secondary | ICD-10-CM | POA: Diagnosis not present

## 2017-10-12 DIAGNOSIS — Z87891 Personal history of nicotine dependence: Secondary | ICD-10-CM | POA: Diagnosis not present

## 2017-10-12 DIAGNOSIS — E119 Type 2 diabetes mellitus without complications: Secondary | ICD-10-CM | POA: Diagnosis not present

## 2017-10-12 DIAGNOSIS — I2510 Atherosclerotic heart disease of native coronary artery without angina pectoris: Secondary | ICD-10-CM | POA: Diagnosis not present

## 2017-10-12 DIAGNOSIS — Z9889 Other specified postprocedural states: Secondary | ICD-10-CM | POA: Diagnosis not present

## 2017-10-12 DIAGNOSIS — Z794 Long term (current) use of insulin: Secondary | ICD-10-CM | POA: Diagnosis not present

## 2017-10-12 DIAGNOSIS — I509 Heart failure, unspecified: Secondary | ICD-10-CM | POA: Diagnosis not present

## 2017-10-13 ENCOUNTER — Telehealth: Payer: Self-pay

## 2017-10-13 ENCOUNTER — Inpatient Hospital Stay: Payer: Medicare HMO | Attending: Oncology

## 2017-10-13 ENCOUNTER — Other Ambulatory Visit: Payer: Self-pay | Admitting: Oncology

## 2017-10-13 DIAGNOSIS — D509 Iron deficiency anemia, unspecified: Secondary | ICD-10-CM | POA: Insufficient documentation

## 2017-10-13 DIAGNOSIS — Z79899 Other long term (current) drug therapy: Secondary | ICD-10-CM | POA: Insufficient documentation

## 2017-10-13 DIAGNOSIS — Z5111 Encounter for antineoplastic chemotherapy: Secondary | ICD-10-CM | POA: Insufficient documentation

## 2017-10-13 DIAGNOSIS — C160 Malignant neoplasm of cardia: Secondary | ICD-10-CM | POA: Insufficient documentation

## 2017-10-13 DIAGNOSIS — Z923 Personal history of irradiation: Secondary | ICD-10-CM | POA: Insufficient documentation

## 2017-10-13 NOTE — Progress Notes (Signed)
Per Worthy Flank PA request, I met with patient to assess navigation needs. Patient lives with wife, has transportation and resources. No barriers to treatment identified. I provided my contact information and explained the role of navigation.  Patient encouraged to contact me with questions or concerns.

## 2017-10-13 NOTE — Telephone Encounter (Signed)
Called patient to introduce myself and to explain the role of GI navigator. I will touch base with patient during his chemo education class today.

## 2017-10-13 NOTE — Progress Notes (Signed)
START ON PATHWAY REGIMEN - Gastroesophageal     Administer weekly during RT:     Paclitaxel      Carboplatin   **Always confirm dose/schedule in your pharmacy ordering system**    Patient Characteristics: Esophageal & GE Junction, Adenocarcinoma, Preoperative or Nonsurgical Candidate (Clinical Staging), cT3 or Higher or cN+, Surgical Candidate (Up to cT4a) - Preoperative Therapy, GE Junction Histology: Adenocarcinoma Disease Classification: GE Junction Therapeutic Status: Preoperative or Nonsurgical Candidate (Clinical Staging) AJCC Grade: GX AJCC 8 Stage Grouping: III AJCC T Category: cT3 AJCC N Category: cN0 AJCC M Category: cM0 Intent of Therapy: Curative Intent, Discussed with Patient

## 2017-10-14 ENCOUNTER — Other Ambulatory Visit: Payer: Self-pay | Admitting: *Deleted

## 2017-10-14 MED ORDER — PROCHLORPERAZINE MALEATE 10 MG PO TABS: 10.0000 mg | ORAL_TABLET | Freq: Four times a day (QID) | ORAL | 1 refills | Status: DC | PRN

## 2017-10-15 ENCOUNTER — Encounter: Payer: Self-pay | Admitting: Oncology

## 2017-10-15 NOTE — Progress Notes (Signed)
Unfortunately there aren't any foundations offering copay assistance for his Dx and the type of ins he has.  I emailed Cindy in the radiation dept requesting she reach out to pt regarding the [facility] grant to assist w/ gas cards.

## 2017-10-18 ENCOUNTER — Telehealth: Payer: Self-pay | Admitting: Oncology

## 2017-10-18 NOTE — Progress Notes (Signed)
Cardiology Office Note    Date:  10/19/2017   ID:  Justin Kida., DOB [birth year], MRN [id number]  PCP:  [facility] [place]  Cardiologist: [name], MD   Chief Complaint  Patient presents with  . Coronary Artery Disease  . Congestive Heart Failure  . Atrial Fibrillation  . Esophageal Cancer    History of Present Illness:  Justin Paglia. is a 67 y.o. male diabetes mellitus type 2, hypertension, prior heavy smoking history, PAF on chronic apixaban therapy, strong family history of CAD, personal history of CAD with coronary bypass grafting 1998 and 2007, drug-eluting stent OM1 2013, who presented with USAP 07/2017 treated with DES and subsequent severe anemia on Plavix and aspirin led to diagnosis of esophageal cancer 4.2019, stage III locally invasive currently starting radiation and chemotherapy with plan for esophagectomy.  No clinical recurrence of atrial fibrillation.  No anginal episodes.  No blood in the urine or stool.  He has not had syncope or significant palpitation.  No requirement for blood transfusion.  Does have some lower extremity swelling that is progressive.  He feels this is related to the decrease in diuretic dose after the last hospital stay.     Past Medical History:  Diagnosis Date  . Angina   . Arthritis    "hands" (08/10/2017)  . Atrial flutter, paroxysmal ([place])    arrived with aflutter with RVR, EF down to 25-30%, s/p TEE DCCV on 01/29/2015  . Chronic lower back pain    "all the time from my sciatic nerve"  . Coronary artery disease   . Erectile dysfunction   . GERD (gastroesophageal reflux disease)   . Heart failure ([place])   . Hypercholesteremia   . Hypertension   . 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 Myocardial infarction ([place]) 1988; ~9417;  ~1996;~ 2000  . Obesity   . Peripheral vascular disease ([place])   . Umbilical hernia    unrepaired    Past Surgical History:  Procedure Laterality Date  .  CARDIOVERSION N/A 01/29/2015   Procedure: CARDIOVERSION;  Surgeon: [name], MD;  Location: [facility];  Service: Cardiovascular;  Laterality: N/A;  . CORONARY ANGIOPLASTY WITH STENT PLACEMENT  08/31/11   "2 today; makes total of 3 stents"  . CORONARY ANGIOPLASTY WITH STENT PLACEMENT  08/10/2017   "2 today; makes a total of 5 stents" (08/10/2017)  . CORONARY ARTERY BYPASS GRAFT  1998; 2007   CABG X 4; CABG X4  . CORONARY STENT INTERVENTION N/A 08/10/2017   Procedure: CORONARY STENT INTERVENTION;  Surgeon: Sherren Mocha, MD;  Location: [facility];  Service: Cardiovascular;  Laterality: N/A;  . ESOPHAGOGASTRODUODENOSCOPY (EGD) WITH PROPOFOL N/A 09/01/2017   Procedure: ESOPHAGOGASTRODUODENOSCOPY (EGD) WITH PROPOFOL;  Surgeon: Laurence Spates, MD;  Location: [facility];  Service: Endoscopy;  Laterality: N/A;  . EUS N/A 09/29/2017   Procedure: UPPER ENDOSCOPIC ULTRASOUND (EUS) LINEAR;  Surgeon: Arta Silence, MD;  Location: [facility];  Service: Endoscopy;  Laterality: N/A;  . LEFT HEART CATH AND CORS/GRAFTS ANGIOGRAPHY N/A 08/10/2017   Procedure: LEFT HEART CATH AND CORS/GRAFTS ANGIOGRAPHY;  Surgeon: Sherren Mocha, MD;  Location: [facility];  Service: Cardiovascular;  Laterality: N/A;  . LEFT HEART CATHETERIZATION WITH CORONARY/GRAFT ANGIOGRAM N/A 08/31/2011   Procedure: LEFT HEART CATHETERIZATION WITH Beatrix Fetters;  Surgeon: [name], MD;  Location: [facility];  Service: Cardiovascular;  Laterality: N/A;  . PERIPHERAL VASCULAR CATHETERIZATION N/A 04/27/2016  Procedure: Abdominal Aortogram w/Lower Extremity;  Surgeon: Angelia Mould, MD;  Location: [facility];  Service: Cardiovascular;  Laterality: N/A;  . TEE WITHOUT CARDIOVERSION N/A 01/29/2015   Procedure: TRANSESOPHAGEAL ECHOCARDIOGRAM (TEE);  Surgeon: [name], MD;  [facility] END[facility];  Service: Cardiovascular;  Laterality: N/A;    Current Medications: Outpatient  Medications Prior to Visit  Medication Sig Dispense Refill  . acetaminophen (TYLENOL) 325 MG tablet Take 2 tablets (650 mg total) by mouth every 6 (six) hours as needed for mild pain or headache.    [name] aspirin EC 81 MG EC tablet Take 1 tablet (81 mg total) by mouth daily.    [name] atorvastatin (LIPITOR) 40 MG tablet Take 40 mg by mouth daily at 6 PM.   3  . candesartan (ATACAND) 16 MG tablet Take 1 tablet (16 mg total) by mouth daily. 90 tablet 3  . clopidogrel (PLAVIX) 75 MG tablet Take 1 tablet (75 mg total) by mouth daily. Take for 6 months. Plan to stop on August 27th. Check with cardiologist at that time. 30 tablet 5  . diltiazem (CARDIZEM CD) 120 MG 24 hr capsule Take 1 capsule (120 mg total) by mouth daily. 30 capsule 11  . ferrous sulfate 325 (65 FE) MG tablet Take 325 mg by mouth daily.  3  . gabapentin (NEURONTIN) 300 MG capsule Take 300 mg by mouth 2 (two) times daily.  1  . LANTUS SOLOSTAR 100 UNIT/ML Solostar Pen Inject 50 Units into the skin daily.   3  . nitroGLYCERIN (NITROSTAT) 0.4 MG SL tablet Place 1 tablet (0.4 mg total) under the tongue every 5 (five) minutes as needed for chest pain (Angina). 25 tablet 3  . pantoprazole (PROTONIX) 40 MG tablet Take 1 tablet (40 mg total) by mouth daily. 30 tablet 11  . prochlorperazine (COMPAZINE) 10 MG tablet Take 1 tablet (10 mg total) by mouth every 6 (six) hours as needed for nausea or vomiting. 30 tablet 1  . sitaGLIPtin (JANUVIA) 100 MG tablet Take 100 mg by mouth daily.    . sucralfate (CARAFATE) 1 GM/10ML suspension Take 10 mLs (1 g total) by mouth 4 (four) times daily -  with meals and at bedtime. 420 mL 0  . furosemide (LASIX) 20 MG tablet Take 20 mg by mouth daily.    . furosemide (LASIX) 20 MG tablet Take 1 tablet (20 mg total) by mouth as directed. 40 MG DAILY FOR 3 DAYS; THEN CHANGE TO 20 MG DAILY (Patient not taking: Reported on 10/19/2017) 90 tablet 3   No facility-administered medications prior to visit.      Allergies:   Patient  has no known allergies.   Social History   Socioeconomic History  . Marital status: Married    Spouse name: Not on file  . Number of children: Not on file  . Years of education: Not on file  . Highest education level: Not on file  Occupational History  . Not on file  Social Needs  . Financial resource strain: Not on file  . Food insecurity:    Worry: Not on file    Inability: Not on file  . Transportation needs:    Medical: Not on file    Non-medical: Not on file  Tobacco Use  . Smoking status: Former Smoker    Packs/day: 2.00    Years: 6.00    Pack years: 12.00    Types: Cigarettes    Last attempt to quit: 06/16/1971    Years  since quitting: 46.3  . Smokeless tobacco: Never Used  . Tobacco comment: "chewed on cigars; never really did chew tobacco"  Substance and Sexual Activity  . Alcohol use: No  . Drug use: No  . Sexual activity: Never  Lifestyle  . Physical activity:    Days per week: Not on file    Minutes per session: Not on file  . Stress: Not on file  Relationships  . Social connections:    Talks on phone: Not on file    Gets together: Not on file    Attends religious service: Not on file    Active member of club or organization: Not on file    Attends meetings of clubs or organizations: Not on file    Relationship status: Not on file  Other Topics Concern  . Not on file  Social History Narrative  . Not on file     Family History:  The patient's family history includes Diabetes in his father and mother; Heart attack in his brother, father, and mother; Heart disease in his father and mother.   ROS:   Please see the history of present illness.    Emotionally improved after meeting with Dr. Alen Blew and radiation oncologist.  Has still not met with Dr. Servando Snare to determine if resection will be attempted. All other systems reviewed and are negative.   PHYSICAL EXAM:   VS:  BP (!) 156/92   Pulse 85   Ht 6' (1.829 m)   Wt 222 lb 6.4 oz (100.9 kg)   BMI  30.16 kg/m    GEN: Well nourished, well developed, in no acute distress  HEENT: normal  Neck: no JVD, carotid bruits, or masses Cardiac: RRR; no murmurs, rubs, or gallops,no edema  Respiratory:  clear to auscultation bilaterally, normal work of breathing GI: soft, nontender, nondistended, + BS MS: no deformity or atrophy  Skin: warm and dry, no rash Neuro:  Alert and Oriented x 3, Strength and sensation are intact Psych: euthymic mood, full affect  Wt Readings from Last 3 Encounters:  10/19/17 222 lb 6.4 oz (100.9 kg)  10/12/17 220 lb 3.2 oz (99.9 kg)  10/06/17 221 lb 11.2 oz (100.6 kg)      Studies/Labs Reviewed:   EKG:  EKG not repeated  Recent Labs: 08/30/2017: TSH 0.671 09/13/2017: BUN 12; Creatinine, Ser 1.23; Hemoglobin 9.5; Platelets 462; Potassium 4.4; Sodium 142 09/21/2017: ALT 9   Lipid Panel    Component Value Date/Time   CHOL 129 09/21/2017 1352   TRIG 141 09/21/2017 1352   HDL 31 (L) 09/21/2017 1352   CHOLHDL 4.2 09/21/2017 1352   LDLCALC 70 09/21/2017 1352    Additional studies/ records that were reviewed today include:  Reviewed oncology notes.    ASSESSMENT:    1. Coronary artery disease involving coronary bypass graft of native heart with angina pectoris ([place])   2. Chronic combined systolic and diastolic heart failure, NYHA class 2 ([place])   3. Atrial flutter, unspecified type ([place])   4. Anticoagulated   5. Essential hypertension   6. Peripheral vascular disease ([place])      PLAN:  In order of problems listed above:  1. Without angina.  If he needs to have esophagectomy performed it will be safe to pause anti- platelet therapy once he is 3-6 months out from most recent stent. We will coordinate this with Dr. Servando Snare.  He is not currently on anticoagulation therapy and will be at risk for development of recurrent atrial fibrillation  with a thoracic surgical procedure. 2. Lower extremity swelling requires furosemide intensity be increased.  Recommend  40 mg Monday Wednesday and Friday and 20 mg on other days.  ASIC metabolic panel in 2 weeks. 3. Notify us of tachycardia or dyspnea. 4. Continue Plavix and aspirin.  He was not placed back on Eliquis after GI bleed.  Will be at risk for atrial fibrillation if he has thoracic surgery.  Currently receiving radiation therapy/chemotherapy and will ultimately likely require esophagectomy by Dr. Servando Snare.  I would clear him for upcoming surgery although he will be at increased risk because of CHF and active coronary disease.  Most recent stent was in February 2019.  He needs to be at least 3 months out from the most recent stent implantation and preferably 6 months before pausing Plavix.  Will discuss with Dr. Servando Snare and his cancer treatment team as required.  Medication Adjustments/Labs and Tests Ordered: Current medicines are reviewed at length with the patient today.  Concerns regarding medicines are outlined above.  Medication changes, Labs and Tests ordered today are listed in the Patient Instructions below. Patient Instructions  Medication Instructions:  1) CHANGE Furosemide to '40mg'$  daily on Monday, Wednesday and Friday then take '20mg'$  daily on Tuesday, Thursday, Saturday and Sunday.   Labwork: Your physician recommends that you return for lab work in: 2 weeks (BMET)   Testing/Procedures: None  Follow-Up: Your physician recommends that you schedule a follow-up appointment in: 4 months with Dr. Tamala Julian.    Any Other Special Instructions Will Be Listed Below (If Applicable).     If you need a refill on your cardiac medications before your next appointment, please call your pharmacy.      Signed, [name], MD  10/19/2017 12:39 PM    [facility] [address], [place], [place]  [address] Phone: [phone]; Fax: [phone]

## 2017-10-18 NOTE — Telephone Encounter (Signed)
Tried to call patient regarding voicemail °

## 2017-10-19 ENCOUNTER — Ambulatory Visit: Payer: Medicare HMO | Admitting: Radiation Oncology

## 2017-10-19 ENCOUNTER — Ambulatory Visit: Payer: Medicare HMO | Admitting: Interventional Cardiology

## 2017-10-19 ENCOUNTER — Encounter: Payer: Medicare HMO | Admitting: Cardiothoracic Surgery

## 2017-10-19 ENCOUNTER — Encounter: Payer: Self-pay | Admitting: Interventional Cardiology

## 2017-10-19 VITALS — BP 156/92 | HR 85 | Ht 72.0 in | Wt 222.4 lb

## 2017-10-19 DIAGNOSIS — I10 Essential (primary) hypertension: Secondary | ICD-10-CM

## 2017-10-19 DIAGNOSIS — Z7901 Long term (current) use of anticoagulants: Secondary | ICD-10-CM | POA: Diagnosis not present

## 2017-10-19 DIAGNOSIS — I739 Peripheral vascular disease, unspecified: Secondary | ICD-10-CM | POA: Diagnosis not present

## 2017-10-19 DIAGNOSIS — I5042 Chronic combined systolic (congestive) and diastolic (congestive) heart failure: Secondary | ICD-10-CM

## 2017-10-19 DIAGNOSIS — I25709 Atherosclerosis of coronary artery bypass graft(s), unspecified, with unspecified angina pectoris: Secondary | ICD-10-CM

## 2017-10-19 DIAGNOSIS — I4892 Unspecified atrial flutter: Secondary | ICD-10-CM | POA: Diagnosis not present

## 2017-10-19 MED ORDER — FUROSEMIDE 20 MG PO TABS: ORAL_TABLET | ORAL | 3 refills | Status: DC

## 2017-10-19 NOTE — Patient Instructions (Signed)
Medication Instructions:  1) CHANGE Furosemide to 40mg  daily on Monday, Wednesday and Friday then take 20mg  daily on Tuesday, Thursday, Saturday and Sunday.   Labwork: Your physician recommends that you return for lab work in: 2 weeks (BMET)   Testing/Procedures: None  Follow-Up: Your physician recommends that you schedule a follow-up appointment in: 4 months with Dr. Tamala Julian.    Any Other Special Instructions Will Be Listed Below (If Applicable).     If you need a refill on your cardiac medications before your next appointment, please call your pharmacy.

## 2017-10-20 ENCOUNTER — Other Ambulatory Visit: Payer: Self-pay | Admitting: *Deleted

## 2017-10-20 ENCOUNTER — Ambulatory Visit
Admission: RE | Admit: 2017-10-20 | Discharge: 2017-10-20 | Disposition: A | Discharge status: Home / Self Care | Payer: Medicare HMO | Source: Ambulatory Visit | Attending: Radiation Oncology | Admitting: Radiation Oncology

## 2017-10-20 DIAGNOSIS — C155 Malignant neoplasm of lower third of esophagus: Secondary | ICD-10-CM | POA: Diagnosis not present

## 2017-10-20 DIAGNOSIS — Z510 Encounter for antineoplastic radiation therapy: Secondary | ICD-10-CM | POA: Insufficient documentation

## 2017-10-22 DIAGNOSIS — C155 Malignant neoplasm of lower third of esophagus: Secondary | ICD-10-CM | POA: Diagnosis not present

## 2017-10-22 DIAGNOSIS — Z510 Encounter for antineoplastic radiation therapy: Secondary | ICD-10-CM | POA: Diagnosis not present

## 2017-10-25 ENCOUNTER — Ambulatory Visit
Admission: RE | Admit: 2017-10-25 | Discharge: 2017-10-25 | Disposition: A | Discharge status: Home / Self Care | Payer: Medicare HMO | Source: Ambulatory Visit | Attending: Radiation Oncology | Admitting: Radiation Oncology

## 2017-10-25 DIAGNOSIS — Z510 Encounter for antineoplastic radiation therapy: Secondary | ICD-10-CM | POA: Diagnosis not present

## 2017-10-25 DIAGNOSIS — C155 Malignant neoplasm of lower third of esophagus: Secondary | ICD-10-CM | POA: Diagnosis not present

## 2017-10-26 ENCOUNTER — Ambulatory Visit
Admission: RE | Admit: 2017-10-26 | Discharge: 2017-10-26 | Disposition: A | Discharge status: Home / Self Care | Payer: Medicare HMO | Source: Ambulatory Visit | Attending: Radiation Oncology | Admitting: Radiation Oncology

## 2017-10-26 DIAGNOSIS — Z510 Encounter for antineoplastic radiation therapy: Secondary | ICD-10-CM | POA: Diagnosis not present

## 2017-10-26 DIAGNOSIS — C155 Malignant neoplasm of lower third of esophagus: Secondary | ICD-10-CM | POA: Diagnosis not present

## 2017-10-27 ENCOUNTER — Ambulatory Visit: Payer: Medicare HMO | Admitting: Oncology

## 2017-10-27 ENCOUNTER — Other Ambulatory Visit: Payer: Medicare HMO

## 2017-10-27 ENCOUNTER — Ambulatory Visit
Admission: RE | Admit: 2017-10-27 | Discharge: 2017-10-27 | Disposition: A | Discharge status: Home / Self Care | Payer: Medicare HMO | Source: Ambulatory Visit | Attending: Radiation Oncology | Admitting: Radiation Oncology

## 2017-10-27 DIAGNOSIS — Z510 Encounter for antineoplastic radiation therapy: Secondary | ICD-10-CM | POA: Diagnosis not present

## 2017-10-27 DIAGNOSIS — C155 Malignant neoplasm of lower third of esophagus: Secondary | ICD-10-CM | POA: Diagnosis not present

## 2017-10-28 ENCOUNTER — Inpatient Hospital Stay: Payer: Medicare HMO

## 2017-10-28 ENCOUNTER — Ambulatory Visit
Admission: RE | Admit: 2017-10-28 | Discharge: 2017-10-28 | Disposition: A | Discharge status: Home / Self Care | Payer: Medicare HMO | Source: Ambulatory Visit | Attending: Radiation Oncology | Admitting: Radiation Oncology

## 2017-10-28 ENCOUNTER — Encounter: Payer: Medicare HMO | Admitting: Nutrition

## 2017-10-28 ENCOUNTER — Inpatient Hospital Stay (HOSPITAL_BASED_OUTPATIENT_CLINIC_OR_DEPARTMENT_OTHER): Payer: Medicare HMO | Admitting: Oncology

## 2017-10-28 VITALS — BP 173/85 | HR 78 | Temp 97.9°F | Resp 18 | Ht 72.0 in | Wt 225.9 lb

## 2017-10-28 VITALS — BP 162/97 | HR 67 | Temp 98.1°F | Resp 18

## 2017-10-28 DIAGNOSIS — C155 Malignant neoplasm of lower third of esophagus: Secondary | ICD-10-CM | POA: Diagnosis not present

## 2017-10-28 DIAGNOSIS — Z79899 Other long term (current) drug therapy: Secondary | ICD-10-CM | POA: Diagnosis not present

## 2017-10-28 DIAGNOSIS — D509 Iron deficiency anemia, unspecified: Secondary | ICD-10-CM | POA: Diagnosis not present

## 2017-10-28 DIAGNOSIS — Z923 Personal history of irradiation: Secondary | ICD-10-CM | POA: Diagnosis not present

## 2017-10-28 DIAGNOSIS — C160 Malignant neoplasm of cardia: Secondary | ICD-10-CM | POA: Diagnosis not present

## 2017-10-28 DIAGNOSIS — Z510 Encounter for antineoplastic radiation therapy: Secondary | ICD-10-CM | POA: Diagnosis not present

## 2017-10-28 DIAGNOSIS — Z5111 Encounter for antineoplastic chemotherapy: Secondary | ICD-10-CM

## 2017-10-28 DIAGNOSIS — C159 Malignant neoplasm of esophagus, unspecified: Secondary | ICD-10-CM

## 2017-10-28 LAB — CMP (CANCER CENTER ONLY)
ALT: 9 U/L (ref 0–55)
AST: 17 U/L (ref 5–34)
Albumin: 3.7 g/dL (ref 3.5–5.0)
Alkaline Phosphatase: 85 U/L (ref 40–150)
Anion gap: 7 (ref 3–11)
BUN: 12 mg/dL (ref 7–26)
CHLORIDE: 105 mmol/L (ref 98–109)
CO2: 27 mmol/L (ref 22–29)
CREATININE: 1.13 mg/dL (ref 0.70–1.30)
Calcium: 9 mg/dL (ref 8.4–10.4)
GFR, Estimated: >60 mL/min (ref >60)
Glucose, Bld: 115 mg/dL (ref 70–140)
POTASSIUM: 3.7 mmol/L (ref 3.5–5.1)
SODIUM: 139 mmol/L (ref 136–145)
Total Bilirubin: 0.6 mg/dL (ref 0.2–1.2)
Total Protein: 7.2 g/dL (ref 6.4–8.3)

## 2017-10-28 LAB — CBC WITH DIFFERENTIAL (CANCER CENTER ONLY)
Basophils Absolute: 0 10*3/uL (ref 0.0–0.1)
Basophils Relative: 1 %
EOS ABS: 0.1 10*3/uL (ref 0.0–0.5)
Eosinophils Relative: 1 %
HCT: 39.4 % (ref 38.4–49.9)
HEMOGLOBIN: 12.2 g/dL — AB (ref 13.0–17.1)
LYMPHS ABS: 0.5 10*3/uL — AB (ref 0.9–3.3)
LYMPHS PCT: 7 %
MCH: 22.5 pg — AB (ref 27.2–33.4)
MCHC: 30.9 g/dL — AB (ref 32.0–36.0)
MCV: 72.7 fL — AB (ref 79.3–98.0)
MONOS PCT: 9 %
Monocytes Absolute: 0.7 10*3/uL (ref 0.1–0.9)
NEUTROS PCT: 82 %
Neutro Abs: 5.8 10*3/uL (ref 1.5–6.5)
Platelet Count: 266 10*3/uL (ref 140–400)
RBC: 5.42 MIL/uL (ref 4.20–5.82)
RDW: 19.8 % — ABNORMAL HIGH (ref 11.0–14.6)
WBC: 7 10*3/uL (ref 4.0–10.3)

## 2017-10-28 MED ORDER — DIPHENHYDRAMINE HCL 50 MG/ML IJ SOLN
INTRAMUSCULAR | Status: AC
  Filled 2017-10-28: qty 1

## 2017-10-28 MED ORDER — CLONIDINE HCL 0.1 MG PO TABS
0.1000 mg | ORAL_TABLET | Freq: Once | ORAL | Status: AC
  Administered 2017-10-28: 0.1 mg via ORAL

## 2017-10-28 MED ORDER — FAMOTIDINE IN NACL 20-0.9 MG/50ML-% IV SOLN
INTRAVENOUS | Status: AC
  Filled 2017-10-28: qty 50

## 2017-10-28 MED ORDER — PALONOSETRON HCL INJECTION 0.25 MG/5ML
INTRAVENOUS | Status: AC
  Filled 2017-10-28: qty 5

## 2017-10-28 MED ORDER — DIPHENHYDRAMINE HCL 50 MG/ML IJ SOLN
50.0000 mg | Freq: Once | INTRAMUSCULAR | Status: AC
  Administered 2017-10-28: 50 mg via INTRAVENOUS

## 2017-10-28 MED ORDER — CLONIDINE HCL 0.1 MG PO TABS
ORAL_TABLET | ORAL | Status: AC
  Filled 2017-10-28: qty 1

## 2017-10-28 MED ORDER — SODIUM CHLORIDE 0.9 % IV SOLN
50.0000 mg/m2 | Freq: Once | INTRAVENOUS | Status: AC
  Administered 2017-10-28: 114 mg via INTRAVENOUS
  Filled 2017-10-28: qty 19

## 2017-10-28 MED ORDER — SODIUM CHLORIDE 0.9 % IV SOLN
20.0000 mg | Freq: Once | INTRAVENOUS | Status: AC
  Administered 2017-10-28: 20 mg via INTRAVENOUS
  Filled 2017-10-28: qty 2

## 2017-10-28 MED ORDER — FAMOTIDINE IN NACL 20-0.9 MG/50ML-% IV SOLN
20.0000 mg | Freq: Once | INTRAVENOUS | Status: AC
  Administered 2017-10-28: 20 mg via INTRAVENOUS

## 2017-10-28 MED ORDER — SODIUM CHLORIDE 0.9 % IV SOLN
Freq: Once | INTRAVENOUS | Status: AC
  Administered 2017-10-28: 10:00:00 via INTRAVENOUS

## 2017-10-28 MED ORDER — SODIUM CHLORIDE 0.9 % IV SOLN
180.0000 mg | Freq: Once | INTRAVENOUS | Status: AC
  Administered 2017-10-28: 180 mg via INTRAVENOUS
  Filled 2017-10-28: qty 18

## 2017-10-28 MED ORDER — PALONOSETRON HCL INJECTION 0.25 MG/5ML
0.2500 mg | Freq: Once | INTRAVENOUS | Status: AC
  Administered 2017-10-28: 0.25 mg via INTRAVENOUS

## 2017-10-28 NOTE — Progress Notes (Signed)
Due to elevated blood pressure, called Justin Blackburn and was advised to give 0.1 Clonidine to patient.

## 2017-10-28 NOTE — Patient Instructions (Signed)
[facility] Discharge Instructions for Patients Receiving Chemotherapy  Today you received the following chemotherapy agents Paclitaxel and Carboplatin  To help prevent nausea and vomiting after your treatment, we encourage you to take your nausea medication.  If you develop nausea and vomiting that is not controlled by your nausea medication, call the clinic.   BELOW ARE SYMPTOMS THAT SHOULD BE REPORTED IMMEDIATELY:  *FEVER GREATER THAN 100.5 F  *CHILLS WITH OR WITHOUT FEVER  NAUSEA AND VOMITING THAT IS NOT CONTROLLED WITH YOUR NAUSEA MEDICATION  *UNUSUAL SHORTNESS OF BREATH  *UNUSUAL BRUISING OR BLEEDING  TENDERNESS IN MOUTH AND THROAT WITH OR WITHOUT PRESENCE OF ULCERS  *URINARY PROBLEMS  *BOWEL PROBLEMS  UNUSUAL RASH Items with * indicate a potential emergency and should be followed up as soon as possible.  Feel free to call the clinic should you have any questions or concerns. The clinic phone number is [phone].  Please show the [facility] at check-in to the Emergency Department and triage nurse.  Paclitaxel injection What is this medicine? PACLITAXEL (PAK li TAX el) is a chemotherapy drug. It targets fast dividing cells, like cancer cells, and causes these cells to die. This medicine is used to treat ovarian cancer, breast cancer, and other cancers. This medicine may be used for other purposes; ask your health care provider or pharmacist if you have questions. COMMON BRAND NAME(S): Onxol, Taxol What should I tell my health care provider before I take this medicine? They need to know if you have any of these conditions: -blood disorders -irregular heartbeat -infection (especially a virus infection such as chickenpox, cold sores, or herpes) -liver disease -previous or ongoing radiation therapy -an unusual or allergic reaction to paclitaxel, alcohol, polyoxyethylated castor oil, other chemotherapy agents, other medicines, foods, dyes, or  preservatives -pregnant or trying to get pregnant -breast-feeding How should I use this medicine? This drug is given as an infusion into a vein. It is administered in a hospital or clinic by a specially trained health care professional. Talk to your pediatrician regarding the use of this medicine in children. Special care may be needed. Overdosage: If you think you have taken too much of this medicine contact a poison control center or emergency room at once. NOTE: This medicine is only for you. Do not share this medicine with others. What if I miss a dose? It is important not to miss your dose. Call your doctor or health care professional if you are unable to keep an appointment. What may interact with this medicine? Do not take this medicine with any of the following medications: -disulfiram -metronidazole This medicine may also interact with the following medications: -cyclosporine -diazepam -ketoconazole -medicines to increase blood counts like filgrastim, pegfilgrastim, sargramostim -other chemotherapy drugs like cisplatin, doxorubicin, epirubicin, etoposide, teniposide, vincristine -quinidine -testosterone -vaccines -verapamil Talk to your doctor or health care professional before taking any of these medicines: -acetaminophen -aspirin -ibuprofen -ketoprofen -naproxen This list may not describe all possible interactions. Give your health care provider a list of all the medicines, herbs, non-prescription drugs, or dietary supplements you use. Also tell them if you smoke, drink alcohol, or use illegal drugs. Some items may interact with your medicine. What should I watch for while using this medicine? Your condition will be monitored carefully while you are receiving this medicine. You will need important blood work done while you are taking this medicine. This medicine can cause serious allergic reactions. To reduce your risk you will need to take other  medicine(s) before  treatment with this medicine. If you experience allergic reactions like skin rash, itching or hives, swelling of the face, lips, or tongue, tell your doctor or health care professional right away. In some cases, you may be given additional medicines to help with side effects. Follow all directions for their use. This drug may make you feel generally unwell. This is not uncommon, as chemotherapy can affect healthy cells as well as cancer cells. Report any side effects. Continue your course of treatment even though you feel ill unless your doctor tells you to stop. Call your doctor or health care professional for advice if you get a fever, chills or sore throat, or other symptoms of a cold or flu. Do not treat yourself. This drug decreases your body's ability to fight infections. Try to avoid being around people who are sick. This medicine may increase your risk to bruise or bleed. Call your doctor or health care professional if you notice any unusual bleeding. Be careful brushing and flossing your teeth or using a toothpick because you may get an infection or bleed more easily. If you have any dental work done, tell your dentist you are receiving this medicine. Avoid taking products that contain aspirin, acetaminophen, ibuprofen, naproxen, or ketoprofen unless instructed by your doctor. These medicines may hide a fever. Do not become pregnant while taking this medicine. Women should inform their doctor if they wish to become pregnant or think they might be pregnant. There is a potential for serious side effects to an unborn child. Talk to your health care professional or pharmacist for more information. Do not breast-feed an infant while taking this medicine. Men are advised not to father a child while receiving this medicine. This product may contain alcohol. Ask your pharmacist or healthcare provider if this medicine contains alcohol. Be sure to tell all healthcare providers you are taking this medicine.  Certain medicines, like metronidazole and disulfiram, can cause an unpleasant reaction when taken with alcohol. The reaction includes flushing, headache, nausea, vomiting, sweating, and increased thirst. The reaction can last from 30 minutes to several hours. What side effects may I notice from receiving this medicine? Side effects that you should report to your doctor or health care professional as soon as possible: -allergic reactions like skin rash, itching or hives, swelling of the face, lips, or tongue -low blood counts - This drug may decrease the number of white blood cells, red blood cells and platelets. You may be at increased risk for infections and bleeding. -signs of infection - fever or chills, cough, sore throat, pain or difficulty passing urine -signs of decreased platelets or bleeding - bruising, pinpoint red spots on the skin, black, tarry stools, nosebleeds -signs of decreased red blood cells - unusually weak or tired, fainting spells, lightheadedness -breathing problems -chest pain -high or low blood pressure -mouth sores -nausea and vomiting -pain, swelling, redness or irritation at the injection site -pain, tingling, numbness in the hands or feet -slow or irregular heartbeat -swelling of the ankle, feet, hands Side effects that usually do not require medical attention (report to your doctor or health care professional if they continue or are bothersome): -bone pain -complete hair loss including hair on your head, underarms, pubic hair, eyebrows, and eyelashes -changes in the color of fingernails -diarrhea -loosening of the fingernails -loss of appetite -muscle or joint pain -red flush to skin -sweating This list may not describe all possible side effects. Call your doctor for medical advice about side effects.  side effects. You may report side effects to FDA at 1-800-FDA-1088. Where should I keep my medicine? This drug is given in a hospital or clinic and will not be  stored at home. NOTE: This sheet is a summary. It may not cover all possible information. If you have questions about this medicine, talk to your doctor, pharmacist, or health care provider.  2018 Elsevier/Gold Standard (2015-04-02 19:58:00)   Carboplatin injection What is this medicine? CARBOPLATIN (KAR boe pla tin) is a chemotherapy drug. It targets fast dividing cells, like cancer cells, and causes these cells to die. This medicine is used to treat ovarian cancer and many other cancers. This medicine may be used for other purposes; ask your health care provider or pharmacist if you have questions. COMMON BRAND NAME(S): Paraplatin What should I tell my health care provider before I take this medicine? They need to know if you have any of these conditions: -blood disorders -hearing problems -kidney disease -recent or ongoing radiation therapy -an unusual or allergic reaction to carboplatin, cisplatin, other chemotherapy, other medicines, foods, dyes, or preservatives -pregnant or trying to get pregnant -breast-feeding How should I use this medicine? This drug is usually given as an infusion into a vein. It is administered in a hospital or clinic by a specially trained health care professional. Talk to your pediatrician regarding the use of this medicine in children. Special care may be needed. Overdosage: If you think you have taken too much of this medicine contact a poison control center or emergency room at once. NOTE: This medicine is only for you. Do not share this medicine with others. What if I miss a dose? It is important not to miss a dose. Call your doctor or health care professional if you are unable to keep an appointment. What may interact with this medicine? -medicines for seizures -medicines to increase blood counts like filgrastim, pegfilgrastim, sargramostim -some antibiotics like amikacin, gentamicin, neomycin, streptomycin, tobramycin -vaccines Talk to your doctor  or health care professional before taking any of these medicines: -acetaminophen -aspirin -ibuprofen -ketoprofen -naproxen This list may not describe all possible interactions. Give your health care provider a list of all the medicines, herbs, non-prescription drugs, or dietary supplements you use. Also tell them if you smoke, drink alcohol, or use illegal drugs. Some items may interact with your medicine. What should I watch for while using this medicine? Your condition will be monitored carefully while you are receiving this medicine. You will need important blood work done while you are taking this medicine. This drug may make you feel generally unwell. This is not uncommon, as chemotherapy can affect healthy cells as well as cancer cells. Report any side effects. Continue your course of treatment even though you feel ill unless your doctor tells you to stop. In some cases, you may be given additional medicines to help with side effects. Follow all directions for their use. Call your doctor or health care professional for advice if you get a fever, chills or sore throat, or other symptoms of a cold or flu. Do not treat yourself. This drug decreases your body's ability to fight infections. Try to avoid being around people who are sick. This medicine may increase your risk to bruise or bleed. Call your doctor or health care professional if you notice any unusual bleeding. Be careful brushing and flossing your teeth or using a toothpick because you may get an infection or bleed more easily. If you have any dental work done, tell your dentist   this medicine. Avoid taking products that contain aspirin, acetaminophen, ibuprofen, naproxen, or ketoprofen unless instructed by your doctor. These medicines may hide a fever. Do not become pregnant while taking this medicine. Women should inform their doctor if they wish to become pregnant or think they might be pregnant. There is a potential for serious  side effects to an unborn child. Talk to your health care professional or pharmacist for more information. Do not breast-feed an infant while taking this medicine. What side effects may I notice from receiving this medicine? Side effects that you should report to your doctor or health care professional as soon as possible: -allergic reactions like skin rash, itching or hives, swelling of the face, lips, or tongue -signs of infection - fever or chills, cough, sore throat, pain or difficulty passing urine -signs of decreased platelets or bleeding - bruising, pinpoint red spots on the skin, black, tarry stools, nosebleeds -signs of decreased red blood cells - unusually weak or tired, fainting spells, lightheadedness -breathing problems -changes in hearing -changes in vision -chest pain -high blood pressure -low blood counts - This drug may decrease the number of white blood cells, red blood cells and platelets. You may be at increased risk for infections and bleeding. -nausea and vomiting -pain, swelling, redness or irritation at the injection site -pain, tingling, numbness in the hands or feet -problems with balance, talking, walking -trouble passing urine or change in the amount of urine Side effects that usually do not require medical attention (report to your doctor or health care professional if they continue or are bothersome): -hair loss -loss of appetite -metallic taste in the mouth or changes in taste This list may not describe all possible side effects. Call your doctor for medical advice about side effects. You may report side effects to FDA at 1-800-FDA-1088. Where should I keep my medicine? This drug is given in a hospital or clinic and will not be stored at home. NOTE: This sheet is a summary. It may not cover all possible information. If you have questions about this medicine, talk to your doctor, pharmacist, or health care provider.  2018 Elsevier/Gold Standard (2007-09-06  14:38:05)  

## 2017-10-28 NOTE — Progress Notes (Signed)
Hematology and Oncology Follow Up Visit  Justin Blackburn [id number] [birth year] 67 y.o. 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    Prior Therapy: He is status post endoscopy and a biopsy in March 2019.  He underwent EUS for staging purposes on 09/29/2017.  His endoscopic staging showed a T3N0 disease.  Current therapy: Definitive treatment with radiation and weekly carboplatin/paclitaxel.  Week 1 of chemotherapy to start on 10/28/2017.  Interim History: Justin Blackburn is here for a follow-up.  Since the last visit, he reports no major changes in his health.  Radiation started on May 13 soft tolerated the first week without complications.  He denies any dysphagia, weakness or any complications.  He denies any hematochezia or melena.  His performance status and activity level remained intact.  He does not report any headaches, blurry vision, syncope or seizures. Does not report any fevers, chills or sweats.  Does not report any cough, wheezing or hemoptysis.  Does not report any chest pain, palpitation, orthopnea or leg edema.  Does not report any nausea, vomiting or abdominal pain.  Does not report any constipation or diarrhea.  Does not report any skeletal complaints.    Does not report frequency, urgency or hematuria.  Does not report any skin rashes or lesions. Does not report any heat or cold intolerance.  Does not report any lymphadenopathy or petechiae.  Does not report any anxiety or depression.  Remaining review of systems is negative.    Medications: I have reviewed the patient's current medications.  Current Outpatient Medications  Medication Sig Dispense Refill  . acetaminophen (TYLENOL) 325 MG tablet Take 2 tablets (650 mg total) by mouth every 6 (six) hours as needed for mild pain or headache.    [name] aspirin EC 81 MG EC tablet Take 1 tablet (81 mg  total) by mouth daily.    [name] atorvastatin (LIPITOR) 40 MG tablet Take 40 mg by mouth daily at 6 PM.   3  . candesartan (ATACAND) 16 MG tablet Take 1 tablet (16 mg total) by mouth daily. 90 tablet 3  . clopidogrel (PLAVIX) 75 MG tablet Take 1 tablet (75 mg total) by mouth daily. Take for 6 months. Plan to stop on August 27th. Check with cardiologist at that time. 30 tablet 5  . diltiazem (CARDIZEM CD) 120 MG 24 hr capsule Take 1 capsule (120 mg total) by mouth daily. 30 capsule 11  . ferrous sulfate 325 (65 FE) MG tablet Take 325 mg by mouth daily.  3  . furosemide (LASIX) 20 MG tablet Take 2 tablets (40mg  total) by mouth Monday, Wednesday and Friday. Take 1 tablet by mouth daily on Tuesday, Thursday, Saturday and Sunday. 270 tablet 3  . gabapentin (NEURONTIN) 300 MG capsule Take 300 mg by mouth 2 (two) times daily.  1  . LANTUS SOLOSTAR 100 UNIT/ML Solostar Pen Inject 50 Units into the skin daily.   3  . nitroGLYCERIN (NITROSTAT) 0.4 MG SL tablet Place 1 tablet (0.4 mg total) under the tongue every 5 (five) minutes as needed for chest pain (Angina). 25 tablet 3  . pantoprazole (PROTONIX) 40 MG tablet Take 1 tablet (40 mg total) by mouth daily. 30 tablet 11  . prochlorperazine (COMPAZINE) 10 MG tablet Take 1 tablet (10 mg total) by mouth every 6 (six) hours as needed for nausea or vomiting. 30 tablet 1  . sitaGLIPtin (JANUVIA)  100 MG tablet Take 100 mg by mouth daily.    . sucralfate (CARAFATE) 1 GM/10ML suspension Take 10 mLs (1 g total) by mouth 4 (four) times daily -  with meals and at bedtime. 420 mL 0   No current facility-administered medications for this visit.      Allergies: No Known Allergies  Past Medical History, Surgical history, Social history, and Family History were reviewed and updated.    Physical Exam: Blood pressure (!) 173/85, pulse 78, temperature 97.9 F (36.6 C), temperature source Oral, resp. rate 18, height 6' (1.829 m), weight 225 lb 14.4 oz (102.5 kg), SpO2 97  %.   ECOG: 1 General appearance: Well-appearing gentleman without distress. Head: Atraumatic without abnormalities. Oropharynx: Membranes are moist and pink. Eyes: Pupils are equal and round reactive to light without any injection or erythema. Lymph nodes: No lymphadenopathy noted in the cervical, supraclavicular, and axillary nodes  Heart:regular rate and rhythm without murmurs or gallops. Lung: clear without any rhonchi, wheezes or dullness to percussion. Abdomin: Soft, nontender without any rebound or guarding. Neurological: No deficits in the motor, sensory exam.  Intact deep tendon reflexes. Skin: No ecchymosis or petechiae. Musculoskeletal: No joint deformity or effusion.     Lab Results: Lab Results  Component Value Date   WBC 7.9 09/13/2017   HGB 9.5 (L) 09/13/2017   HCT 32.2 (L) 09/13/2017   MCV 76 (L) 09/13/2017   PLT 462 (H) 09/13/2017     Chemistry      Component Value Date/Time   NA 142 09/13/2017 1233   K 4.4 09/13/2017 1233   CL 104 09/13/2017 1233   CO2 21 09/13/2017 1233   BUN 12 09/13/2017 1233   CREATININE 1.23 09/13/2017 1233      Component Value Date/Time   CALCIUM 9.2 09/13/2017 1233   ALKPHOS 79 09/21/2017 1352   AST 14 09/21/2017 1352   ALT 9 09/21/2017 1352   BILITOT 0.5 09/21/2017 1352        IMPRESSION: 1. Mild-to-moderate hypermetabolism in the distal esophagus and along the GE junction. No paraesophageal, gastrohepatic ligament or celiac axis adenopathy. No findings for hepatic metastatic disease. 2. No evidence of pulmonary metastatic disease. There are moderate bilateral pleural effusions with bibasilar atelectasis.  Impression and Plan:  67 year old gentleman with the following issues:  1.  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  Staging work-up did not reveal any evidence of metastatic disease.  She is currently receiving definitive therapy with radiation and weekly chemotherapy utilizing  carboplatin and paclitaxel.  Today's is a week 1 of chemotherapy and ready to proceed.  Complication associated with this therapy was reviewed again including potential side effects to include nausea, fatigue, myelosuppression and infusion related complications.  He is agreeable to proceed at this time.  Upon completing his therapy, we will restage him and will consider definitive surgical therapy depending on his tolerance and cardiac clearance.  2.  IV access: We will use his peripheral veins consider Port-A-Cath insertion for an issue arises.  3.  Antiemetics: Prescription for Compazine is made available to the patient.  4.  Iron deficiency anemia: Hemoglobin has corrected adequately.  We will continue to monitor his iron studies periodically.  5.  Prognosis: Treatment goal is curative at this time.  His response to therapy with dictate his overall long prognosis.  6.  Follow-up: We will be weekly for chemotherapy and I will have a clinical evaluation in 2 weeks on Nov 11, 2017 with  advanced practice provider.  25  minutes was spent with the patient face-to-face today.  More than 50% of time was dedicated to patient counseling, education and answering questions regarding future plan of care.  Zola Button, MD 5/16/20198:36 AM

## 2017-10-29 ENCOUNTER — Ambulatory Visit
Admission: RE | Admit: 2017-10-29 | Discharge: 2017-10-29 | Disposition: A | Discharge status: Home / Self Care | Payer: Medicare HMO | Source: Ambulatory Visit | Attending: Radiation Oncology | Admitting: Radiation Oncology

## 2017-10-29 DIAGNOSIS — C155 Malignant neoplasm of lower third of esophagus: Secondary | ICD-10-CM | POA: Diagnosis not present

## 2017-10-29 DIAGNOSIS — Z510 Encounter for antineoplastic radiation therapy: Secondary | ICD-10-CM | POA: Diagnosis not present

## 2017-10-29 DIAGNOSIS — C159 Malignant neoplasm of esophagus, unspecified: Secondary | ICD-10-CM

## 2017-10-29 MED ORDER — SONAFINE EX EMUL
1.0000 "application " | Freq: Once | CUTANEOUS | Status: AC
  Administered 2017-10-29: 1 via TOPICAL

## 2017-10-29 NOTE — Progress Notes (Signed)
  Radiation Oncology         [phone]) [phone] ________________________________  Name: Justin Blackburn. MRN: [id number]  Date: 10/20/2017  DOB: 1950/07/01  SIMULATION AND TREATMENT PLANNING NOTE  DIAGNOSIS:     ICD-10-CM   1. 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  Identity was confirmed.  All relevant records and images related to the planned course of therapy were reviewed.   Written consent to proceed with treatment was confirmed which was freely given after reviewing the details related to the planned course of therapy had been reviewed with the patient.  Then, the patient was set-up in a stable reproducible supine position for radiation therapy.  CT images were obtained.  Surface markings were placed.    Medically necessary complex treatment device(s) for immobilization:  vac-lock bag.   The CT images were loaded into the planning software.  Then the target and avoidance structures were contoured.  Treatment planning then occurred.  The radiation prescription was entered and confirmed. I have requested : Intensity Modulated Radiotherapy (IMRT) is medically necessary for this case for the following reason:  sparing of nearby critical normal structures including the spinal cord, heart, and lungs.   The patient will undergo daily image guidance to ensure accurate localization of the target, and adequate minimize dose to the normal surrounding structures in close proximity to the target.   PLAN:  The patient will receive 50 Gy in 25 fractions initially, also using a simultaneous boost technique to 45 Gy. A boost will then be given to 6 Gy/ 5.4 Gy using a SIB technique to yield 56 Gy to the high dose target and 50.4 Gy to the lower dose target.  Special treatment procedure The patient will also receive concurrent chemotherapy during the treatment. The patient may therefore  experience increased toxicity or side effects and the patient will be monitored for such problems. This may require extra lab work as necessary. This therefore constitutes a special treatment procedure.   ________________________________   Jodelle Gross, MD, PhD

## 2017-10-29 NOTE — Progress Notes (Signed)
Pt here for patient teaching.  Pt given Radiation and You booklet and Sonafine.  Reviewed areas of pertinence such as fatigue, hair loss, skin changes and throat changes . Pt able to give teach back of to pat skin,apply Sonafine bid and avoid applying anything to skin within 4 hours of treatment. Pt verbalizes understanding of information given and will contact nursing with any questions or concerns.     Domenique Quest M, RN      

## 2017-11-01 ENCOUNTER — Ambulatory Visit
Admission: RE | Admit: 2017-11-01 | Discharge: 2017-11-01 | Disposition: A | Discharge status: Home / Self Care | Payer: Medicare HMO | Source: Ambulatory Visit | Attending: Radiation Oncology | Admitting: Radiation Oncology

## 2017-11-01 DIAGNOSIS — Z510 Encounter for antineoplastic radiation therapy: Secondary | ICD-10-CM | POA: Diagnosis not present

## 2017-11-01 DIAGNOSIS — C155 Malignant neoplasm of lower third of esophagus: Secondary | ICD-10-CM | POA: Diagnosis not present

## 2017-11-02 ENCOUNTER — Ambulatory Visit
Admission: RE | Admit: 2017-11-02 | Discharge: 2017-11-02 | Disposition: A | Discharge status: Home / Self Care | Payer: Medicare HMO | Source: Ambulatory Visit | Attending: Radiation Oncology | Admitting: Radiation Oncology

## 2017-11-02 ENCOUNTER — Other Ambulatory Visit: Payer: Medicare HMO

## 2017-11-02 DIAGNOSIS — C155 Malignant neoplasm of lower third of esophagus: Secondary | ICD-10-CM | POA: Diagnosis not present

## 2017-11-02 DIAGNOSIS — Z510 Encounter for antineoplastic radiation therapy: Secondary | ICD-10-CM | POA: Diagnosis not present

## 2017-11-03 ENCOUNTER — Ambulatory Visit
Admission: RE | Admit: 2017-11-03 | Discharge: 2017-11-03 | Disposition: A | Discharge status: Home / Self Care | Payer: Medicare HMO | Source: Ambulatory Visit | Attending: Radiation Oncology | Admitting: Radiation Oncology

## 2017-11-03 DIAGNOSIS — Z510 Encounter for antineoplastic radiation therapy: Secondary | ICD-10-CM | POA: Diagnosis not present

## 2017-11-03 DIAGNOSIS — C155 Malignant neoplasm of lower third of esophagus: Secondary | ICD-10-CM | POA: Diagnosis not present

## 2017-11-04 ENCOUNTER — Inpatient Hospital Stay: Payer: Medicare HMO

## 2017-11-04 ENCOUNTER — Ambulatory Visit
Admission: RE | Admit: 2017-11-04 | Discharge: 2017-11-04 | Disposition: A | Discharge status: Home / Self Care | Payer: Medicare HMO | Source: Ambulatory Visit | Attending: Radiation Oncology | Admitting: Radiation Oncology

## 2017-11-04 VITALS — BP 145/77 | HR 71 | Temp 98.6°F | Resp 18

## 2017-11-04 DIAGNOSIS — C155 Malignant neoplasm of lower third of esophagus: Secondary | ICD-10-CM | POA: Diagnosis not present

## 2017-11-04 DIAGNOSIS — Z79899 Other long term (current) drug therapy: Secondary | ICD-10-CM | POA: Diagnosis not present

## 2017-11-04 DIAGNOSIS — Z5111 Encounter for antineoplastic chemotherapy: Secondary | ICD-10-CM | POA: Diagnosis not present

## 2017-11-04 DIAGNOSIS — Z923 Personal history of irradiation: Secondary | ICD-10-CM | POA: Diagnosis not present

## 2017-11-04 DIAGNOSIS — D509 Iron deficiency anemia, unspecified: Secondary | ICD-10-CM | POA: Diagnosis not present

## 2017-11-04 DIAGNOSIS — Z510 Encounter for antineoplastic radiation therapy: Secondary | ICD-10-CM | POA: Diagnosis not present

## 2017-11-04 DIAGNOSIS — C160 Malignant neoplasm of cardia: Secondary | ICD-10-CM | POA: Diagnosis not present

## 2017-11-04 LAB — CBC WITH DIFFERENTIAL (CANCER CENTER ONLY)
BASOS ABS: 0 10*3/uL (ref 0.0–0.1)
Basophils Relative: 1 %
Eosinophils Absolute: 0.1 10*3/uL (ref 0.0–0.5)
Eosinophils Relative: 2 %
HEMATOCRIT: 40.8 % (ref 38.4–49.9)
HEMOGLOBIN: 12.5 g/dL — AB (ref 13.0–17.1)
Lymphocytes Relative: 4 %
Lymphs Abs: 0.2 10*3/uL — ABNORMAL LOW (ref 0.9–3.3)
MCH: 22.5 pg — ABNORMAL LOW (ref 27.2–33.4)
MCHC: 30.7 g/dL — ABNORMAL LOW (ref 32.0–36.0)
MCV: 73.2 fL — ABNORMAL LOW (ref 79.3–98.0)
MONO ABS: 0.2 10*3/uL (ref 0.1–0.9)
Monocytes Relative: 5 %
NEUTROS ABS: 4.3 10*3/uL (ref 1.5–6.5)
Neutrophils Relative %: 88 %
Platelet Count: 241 10*3/uL (ref 140–400)
RBC: 5.57 MIL/uL (ref 4.20–5.82)
RDW: 19.7 % — AB (ref 11.0–14.6)
WBC Count: 4.9 10*3/uL (ref 4.0–10.3)

## 2017-11-04 LAB — CMP (CANCER CENTER ONLY)
ALBUMIN: 3.6 g/dL (ref 3.5–5.0)
ALT: 14 U/L (ref 0–55)
ANION GAP: 6 (ref 3–11)
AST: 17 U/L (ref 5–34)
Alkaline Phosphatase: 72 U/L (ref 40–150)
BILIRUBIN TOTAL: 0.7 mg/dL (ref 0.2–1.2)
BUN: 11 mg/dL (ref 7–26)
CO2: 27 mmol/L (ref 22–29)
Calcium: 8.8 mg/dL (ref 8.4–10.4)
Chloride: 105 mmol/L (ref 98–109)
Creatinine: 0.93 mg/dL (ref 0.70–1.30)
GFR, Estimated: >60 mL/min (ref >60)
GLUCOSE: 114 mg/dL (ref 70–140)
POTASSIUM: 3.5 mmol/L (ref 3.5–5.1)
SODIUM: 138 mmol/L (ref 136–145)
TOTAL PROTEIN: 6.9 g/dL (ref 6.4–8.3)

## 2017-11-04 MED ORDER — SODIUM CHLORIDE 0.9 % IV SOLN
180.0000 mg | Freq: Once | INTRAVENOUS | Status: AC
  Administered 2017-11-04: 180 mg via INTRAVENOUS
  Filled 2017-11-04: qty 18

## 2017-11-04 MED ORDER — SODIUM CHLORIDE 0.9 % IV SOLN
50.0000 mg/m2 | Freq: Once | INTRAVENOUS | Status: AC
  Administered 2017-11-04: 114 mg via INTRAVENOUS
  Filled 2017-11-04: qty 19

## 2017-11-04 MED ORDER — DIPHENHYDRAMINE HCL 50 MG/ML IJ SOLN
50.0000 mg | Freq: Once | INTRAMUSCULAR | Status: AC
  Administered 2017-11-04: 50 mg via INTRAVENOUS

## 2017-11-04 MED ORDER — PALONOSETRON HCL INJECTION 0.25 MG/5ML
0.2500 mg | Freq: Once | INTRAVENOUS | Status: AC
  Administered 2017-11-04: 0.25 mg via INTRAVENOUS

## 2017-11-04 MED ORDER — SODIUM CHLORIDE 0.9 % IV SOLN
20.0000 mg | Freq: Once | INTRAVENOUS | Status: AC
  Administered 2017-11-04: 20 mg via INTRAVENOUS
  Filled 2017-11-04: qty 2

## 2017-11-04 MED ORDER — SODIUM CHLORIDE 0.9 % IV SOLN
Freq: Once | INTRAVENOUS | Status: AC
  Administered 2017-11-04: 12:00:00 via INTRAVENOUS

## 2017-11-04 MED ORDER — FAMOTIDINE IN NACL 20-0.9 MG/50ML-% IV SOLN
20.0000 mg | Freq: Once | INTRAVENOUS | Status: AC
  Administered 2017-11-04: 20 mg via INTRAVENOUS

## 2017-11-04 MED ORDER — DIPHENHYDRAMINE HCL 50 MG/ML IJ SOLN
INTRAMUSCULAR | Status: AC
Start: 2017-11-04 — End: ?
  Filled 2017-11-04: qty 1

## 2017-11-04 MED ORDER — PALONOSETRON HCL INJECTION 0.25 MG/5ML
INTRAVENOUS | Status: AC
Start: 2017-11-04 — End: ?
  Filled 2017-11-04: qty 5

## 2017-11-04 MED ORDER — FAMOTIDINE IN NACL 20-0.9 MG/50ML-% IV SOLN
INTRAVENOUS | Status: AC
Start: 2017-11-04 — End: ?
  Filled 2017-11-04: qty 50

## 2017-11-04 NOTE — Patient Instructions (Signed)
   [facility] Discharge Instructions for Patients Receiving Chemotherapy  Today you received the following chemotherapy agents Taxol and Carboplatin   To help prevent nausea and vomiting after your treatment, we encourage you to take your nausea medication as directed.    If you develop nausea and vomiting that is not controlled by your nausea medication, call the clinic.   BELOW ARE SYMPTOMS THAT SHOULD BE REPORTED IMMEDIATELY:  *FEVER GREATER THAN 100.5 F  *CHILLS WITH OR WITHOUT FEVER  NAUSEA AND VOMITING THAT IS NOT CONTROLLED WITH YOUR NAUSEA MEDICATION  *UNUSUAL SHORTNESS OF BREATH  *UNUSUAL BRUISING OR BLEEDING  TENDERNESS IN MOUTH AND THROAT WITH OR WITHOUT PRESENCE OF ULCERS  *URINARY PROBLEMS  *BOWEL PROBLEMS  UNUSUAL RASH Items with * indicate a potential emergency and should be followed up as soon as possible.  Feel free to call the clinic should you have any questions or concerns. The clinic phone number is [phone].  Please show the CHEMO ALERT CARD at check-in to the Emergency Department and triage nurse.   

## 2017-11-05 ENCOUNTER — Ambulatory Visit
Admission: RE | Admit: 2017-11-05 | Discharge: 2017-11-05 | Disposition: A | Discharge status: Home / Self Care | Payer: Medicare HMO | Source: Ambulatory Visit | Attending: Radiation Oncology | Admitting: Radiation Oncology

## 2017-11-05 DIAGNOSIS — C155 Malignant neoplasm of lower third of esophagus: Secondary | ICD-10-CM | POA: Diagnosis not present

## 2017-11-05 DIAGNOSIS — Z510 Encounter for antineoplastic radiation therapy: Secondary | ICD-10-CM | POA: Diagnosis not present

## 2017-11-09 ENCOUNTER — Ambulatory Visit
Admission: RE | Admit: 2017-11-09 | Discharge: 2017-11-09 | Disposition: A | Discharge status: Home / Self Care | Payer: Medicare HMO | Source: Ambulatory Visit | Attending: Radiation Oncology | Admitting: Radiation Oncology

## 2017-11-09 ENCOUNTER — Telehealth: Payer: Self-pay | Admitting: *Deleted

## 2017-11-09 DIAGNOSIS — C155 Malignant neoplasm of lower third of esophagus: Secondary | ICD-10-CM | POA: Diagnosis not present

## 2017-11-09 DIAGNOSIS — Z510 Encounter for antineoplastic radiation therapy: Secondary | ICD-10-CM | POA: Diagnosis not present

## 2017-11-09 NOTE — Telephone Encounter (Signed)
-----   Message from Belva Crome, MD sent at 11/06/2017 10:16 AM EDT ----- Labs done by Oncology are sufficient. How is breathing and swelling since change in diuretic regimen? ----- Message ----- From: Loren Racer, LPN Sent: 3/54/6568   7:47 AM To: Belva Crome, MD  Pt seen 5/7 and Furosemide changed to 40mg  M,W,F/20mg  Tu, Th, Sa, Su. Pt was to have BMET in 2 weeks.  He had a CMET with Oncology on 5/16.  Does he still need to come to our office for labs or will those be ok for the Furosemide change?

## 2017-11-09 NOTE — Telephone Encounter (Signed)
Spoke with pt and he states that his swelling has improved and breathing is fine.  Made him aware labs were fine.  Advised if swelling or breathing worsens to let us know.  Pt appreciative for call.

## 2017-11-10 ENCOUNTER — Ambulatory Visit
Admission: RE | Admit: 2017-11-10 | Discharge: 2017-11-10 | Disposition: A | Discharge status: Home / Self Care | Payer: Medicare HMO | Source: Ambulatory Visit | Attending: Radiation Oncology | Admitting: Radiation Oncology

## 2017-11-10 ENCOUNTER — Telehealth: Payer: Self-pay

## 2017-11-10 ENCOUNTER — Telehealth: Payer: Self-pay | Admitting: Oncology

## 2017-11-10 DIAGNOSIS — C155 Malignant neoplasm of lower third of esophagus: Secondary | ICD-10-CM | POA: Diagnosis not present

## 2017-11-10 DIAGNOSIS — Z510 Encounter for antineoplastic radiation therapy: Secondary | ICD-10-CM | POA: Diagnosis not present

## 2017-11-10 NOTE — Telephone Encounter (Signed)
Per Kristuin okay to place patient on Friday after Rad. Per 5/29 msg

## 2017-11-10 NOTE — Telephone Encounter (Signed)
Called again and spoke with patient re change in appointment time for 5/30 @ 9:30 am.

## 2017-11-10 NOTE — Telephone Encounter (Signed)
Spoke with patient concerning appointment for 5/30.

## 2017-11-10 NOTE — Telephone Encounter (Signed)
Added f/u to 5/30 lab/tx (per 5/16 los). Also moved 6/13 f/u from [facility] to [facility] - per 5/16 los 5/30 w/[facility] and 6/13 with [facility]. Appointments added for AR. Left message for patient re change in appointment time 5/30 from 10 am to 9:30 am.

## 2017-11-11 ENCOUNTER — Inpatient Hospital Stay (HOSPITAL_BASED_OUTPATIENT_CLINIC_OR_DEPARTMENT_OTHER): Payer: Medicare HMO | Admitting: Oncology

## 2017-11-11 ENCOUNTER — Encounter: Payer: Self-pay | Admitting: Oncology

## 2017-11-11 ENCOUNTER — Ambulatory Visit
Admission: RE | Admit: 2017-11-11 | Discharge: 2017-11-11 | Disposition: A | Discharge status: Home / Self Care | Payer: Medicare HMO | Source: Ambulatory Visit | Attending: Radiation Oncology | Admitting: Radiation Oncology

## 2017-11-11 ENCOUNTER — Inpatient Hospital Stay: Payer: Medicare HMO

## 2017-11-11 ENCOUNTER — Other Ambulatory Visit: Payer: Self-pay

## 2017-11-11 VITALS — BP 137/70 | HR 73 | Temp 97.5°F | Resp 18 | Ht 72.0 in | Wt 215.5 lb

## 2017-11-11 DIAGNOSIS — Z79899 Other long term (current) drug therapy: Secondary | ICD-10-CM

## 2017-11-11 DIAGNOSIS — C160 Malignant neoplasm of cardia: Secondary | ICD-10-CM

## 2017-11-11 DIAGNOSIS — Z5111 Encounter for antineoplastic chemotherapy: Secondary | ICD-10-CM | POA: Diagnosis not present

## 2017-11-11 DIAGNOSIS — Z923 Personal history of irradiation: Secondary | ICD-10-CM

## 2017-11-11 DIAGNOSIS — C155 Malignant neoplasm of lower third of esophagus: Secondary | ICD-10-CM

## 2017-11-11 DIAGNOSIS — D509 Iron deficiency anemia, unspecified: Secondary | ICD-10-CM | POA: Diagnosis not present

## 2017-11-11 DIAGNOSIS — Z510 Encounter for antineoplastic radiation therapy: Secondary | ICD-10-CM | POA: Diagnosis not present

## 2017-11-11 LAB — CBC WITH DIFFERENTIAL (CANCER CENTER ONLY)
BASOS ABS: 0 10*3/uL (ref 0.0–0.1)
BASOS PCT: 2 %
Eosinophils Absolute: 0 10*3/uL (ref 0.0–0.5)
Eosinophils Relative: 3 %
HEMATOCRIT: 38.9 % (ref 38.4–49.9)
Hemoglobin: 12.1 g/dL — ABNORMAL LOW (ref 13.0–17.1)
Lymphocytes Relative: 5 %
Lymphs Abs: 0.1 10*3/uL — ABNORMAL LOW (ref 0.9–3.3)
MCH: 22.8 pg — ABNORMAL LOW (ref 27.2–33.4)
MCHC: 31.2 g/dL — ABNORMAL LOW (ref 32.0–36.0)
MCV: 73.1 fL — ABNORMAL LOW (ref 79.3–98.0)
Monocytes Absolute: 0.3 10*3/uL (ref 0.1–0.9)
Monocytes Relative: 13 %
NEUTROS ABS: 1.5 10*3/uL (ref 1.5–6.5)
NEUTROS PCT: 77 %
Platelet Count: 186 10*3/uL (ref 140–400)
RBC: 5.32 MIL/uL (ref 4.20–5.82)
RDW: 19.6 % — ABNORMAL HIGH (ref 11.0–14.6)
WBC Count: 2 10*3/uL — ABNORMAL LOW (ref 4.0–10.3)

## 2017-11-11 LAB — CMP (CANCER CENTER ONLY)
ALK PHOS: 57 U/L (ref 40–150)
ALT: 9 U/L (ref 0–55)
ANION GAP: 9 (ref 3–11)
AST: 14 U/L (ref 5–34)
Albumin: 3.5 g/dL (ref 3.5–5.0)
BUN: 13 mg/dL (ref 7–26)
CALCIUM: 8.4 mg/dL (ref 8.4–10.4)
CO2: 25 mmol/L (ref 22–29)
Chloride: 105 mmol/L (ref 98–109)
Creatinine: 0.95 mg/dL (ref 0.70–1.30)
GFR, Est AFR Am: >60 mL/min (ref >60)
GFR, Estimated: >60 mL/min (ref >60)
Glucose, Bld: 139 mg/dL (ref 70–140)
POTASSIUM: 3.1 mmol/L — AB (ref 3.5–5.1)
SODIUM: 139 mmol/L (ref 136–145)
Total Bilirubin: 0.6 mg/dL (ref 0.2–1.2)
Total Protein: 6.4 g/dL (ref 6.4–8.3)

## 2017-11-11 MED ORDER — SODIUM CHLORIDE 0.9 % IV SOLN
Freq: Once | INTRAVENOUS | Status: AC
  Administered 2017-11-11: 13:00:00 via INTRAVENOUS

## 2017-11-11 MED ORDER — PALONOSETRON HCL INJECTION 0.25 MG/5ML
INTRAVENOUS | Status: AC
  Filled 2017-11-11: qty 5

## 2017-11-11 MED ORDER — FAMOTIDINE IN NACL 20-0.9 MG/50ML-% IV SOLN
20.0000 mg | Freq: Once | INTRAVENOUS | Status: AC
  Administered 2017-11-11: 20 mg via INTRAVENOUS

## 2017-11-11 MED ORDER — SODIUM CHLORIDE 0.9 % IV SOLN
20.0000 mg | Freq: Once | INTRAVENOUS | Status: AC
  Administered 2017-11-11: 20 mg via INTRAVENOUS
  Filled 2017-11-11: qty 2

## 2017-11-11 MED ORDER — DIPHENHYDRAMINE HCL 50 MG/ML IJ SOLN
50.0000 mg | Freq: Once | INTRAMUSCULAR | Status: AC
  Administered 2017-11-11: 50 mg via INTRAVENOUS

## 2017-11-11 MED ORDER — PALONOSETRON HCL INJECTION 0.25 MG/5ML
0.2500 mg | Freq: Once | INTRAVENOUS | Status: AC
  Administered 2017-11-11: 0.25 mg via INTRAVENOUS

## 2017-11-11 MED ORDER — FAMOTIDINE IN NACL 20-0.9 MG/50ML-% IV SOLN
INTRAVENOUS | Status: AC
  Filled 2017-11-11: qty 50

## 2017-11-11 MED ORDER — SODIUM CHLORIDE 0.9 % IV SOLN
180.0000 mg | Freq: Once | INTRAVENOUS | Status: AC
  Administered 2017-11-11: 180 mg via INTRAVENOUS
  Filled 2017-11-11: qty 18

## 2017-11-11 MED ORDER — DIPHENHYDRAMINE HCL 50 MG/ML IJ SOLN
INTRAMUSCULAR | Status: AC
  Filled 2017-11-11: qty 1

## 2017-11-11 MED ORDER — SODIUM CHLORIDE 0.9 % IV SOLN
50.0000 mg/m2 | Freq: Once | INTRAVENOUS | Status: AC
  Administered 2017-11-11: 114 mg via INTRAVENOUS
  Filled 2017-11-11: qty 19

## 2017-11-11 NOTE — Progress Notes (Signed)
Hematology and Oncology Follow Up Visit  Justin Blackburn [id number] 16-Jul-1950 67 y.o. 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    Prior Therapy: He is status post endoscopy and a biopsy in March 2019.  He underwent EUS for staging purposes on 09/29/2017.  His endoscopic staging showed a T3N0 disease.  Current therapy: Definitive treatment with radiation and weekly carboplatin/paclitaxel.  Week 1 of chemotherapy to start on 10/28/2017.  Status post 2 weeks of treatment.  Interim History: Justin Blackburn is here for a follow-up.  Since the last visit, he reports no major changes in his health.  He continues to tolerate his concurrent chemoradiation fairly well.  He has had a weight loss but reports that this is due to fluid.  He has recently increase his diuretics due to edema in his lower extremities.  The lower extremity edema has resolved.  He reports a good appetite.  He denies any dysphagia, weakness or any complications.  He denies any hematochezia or melena.  His performance status and activity level remained intact.  He does not report any headaches, blurry vision, syncope or seizures. Does not report any fevers, chills or sweats.  Does not report any cough, wheezing or hemoptysis.  Does not report any chest pain, palpitation, orthopnea or leg edema.  Does not report any nausea, vomiting or abdominal pain.  Does not report any constipation or diarrhea.  Does not report any skeletal complaints.    Does not report frequency, urgency or hematuria.  Does not report any skin rashes or lesions. Does not report any heat or cold intolerance.  Does not report any lymphadenopathy or petechiae.  Does not report any anxiety or depression.  Remaining review of systems is negative.  The patient is here for evaluation prior to week #3 of his treatment.   Medications: I have  reviewed the patient's current medications.  Current Outpatient Medications  Medication Sig Dispense Refill  . acetaminophen (TYLENOL) 325 MG tablet Take 2 tablets (650 mg total) by mouth every 6 (six) hours as needed for mild pain or headache.    [name] aspirin EC 81 MG EC tablet Take 1 tablet (81 mg total) by mouth daily.    [name] atorvastatin (LIPITOR) 40 MG tablet Take 40 mg by mouth daily at 6 PM.   3  . candesartan (ATACAND) 16 MG tablet Take 1 tablet (16 mg total) by mouth daily. 90 tablet 3  . clopidogrel (PLAVIX) 75 MG tablet Take 1 tablet (75 mg total) by mouth daily. Take for 6 months. Plan to stop on August 27th. Check with cardiologist at that time. 30 tablet 5  . diltiazem (CARDIZEM CD) 120 MG 24 hr capsule Take 1 capsule (120 mg total) by mouth daily. 30 capsule 11  . ferrous sulfate 325 (65 FE) MG tablet Take 325 mg by mouth daily.  3  . furosemide (LASIX) 20 MG tablet Take 2 tablets (40mg  total) by mouth Monday, Wednesday and Friday. Take 1 tablet by mouth daily on Tuesday, Thursday, Saturday and Sunday. 270 tablet 3  . gabapentin (NEURONTIN) 300 MG capsule Take 300 mg by mouth 2 (two) times daily.  1  . LANTUS SOLOSTAR 100 UNIT/ML Solostar Pen Inject 50 Units into the skin daily.   3  . pantoprazole (PROTONIX) 40 MG tablet Take 1 tablet (40 mg total) by mouth daily. 30 tablet 11  . prochlorperazine (  COMPAZINE) 10 MG tablet Take 1 tablet (10 mg total) by mouth every 6 (six) hours as needed for nausea or vomiting. 30 tablet 1  . sitaGLIPtin (JANUVIA) 100 MG tablet Take 100 mg by mouth daily.    . sucralfate (CARAFATE) 1 GM/10ML suspension Take 10 mLs (1 g total) by mouth 4 (four) times daily -  with meals and at bedtime. 420 mL 0  . nitroGLYCERIN (NITROSTAT) 0.4 MG SL tablet Place 1 tablet (0.4 mg total) under the tongue every 5 (five) minutes as needed for chest pain (Angina). (Patient not taking: Reported on 11/11/2017) 25 tablet 3   No current facility-administered medications for this  visit.      Allergies: No Known Allergies  Past Medical History, Surgical history, Social history, and Family History were reviewed and updated.    Physical Exam: Blood pressure 137/70, pulse 73, temperature (!) 97.5 F (36.4 C), temperature source Oral, resp. rate 18, height 6' (1.829 m), weight 215 lb 8 oz (97.8 kg), SpO2 100 %.   ECOG: 1 General appearance: Well-appearing gentleman without distress. Head: Atraumatic without abnormalities. Oropharynx: Membranes are moist and pink. Eyes: Pupils are equal and round reactive to light without any injection or erythema. Lymph nodes: No lymphadenopathy noted in the cervical, supraclavicular, and axillary nodes  Heart:regular rate and rhythm without murmurs or gallops. Lung: clear without any rhonchi, wheezes or dullness to percussion. Abdomen: Soft, nontender without any rebound or guarding. Neurological: No deficits in the motor, sensory exam.  Intact deep tendon reflexes. Skin: No ecchymosis or petechiae. Musculoskeletal: No joint deformity or effusion.     Lab Results: Lab Results  Component Value Date   WBC 2.0 (L) 11/11/2017   HGB 12.1 (L) 11/11/2017   HCT 38.9 11/11/2017   MCV 73.1 (L) 11/11/2017   PLT 186 11/11/2017     Chemistry      Component Value Date/Time   NA 138 11/04/2017 1014   NA 142 09/13/2017 1233   K 3.5 11/04/2017 1014   CL 105 11/04/2017 1014   CO2 27 11/04/2017 1014   BUN 11 11/04/2017 1014   BUN 12 09/13/2017 1233   CREATININE 0.93 11/04/2017 1014      Component Value Date/Time   CALCIUM 8.8 11/04/2017 1014   ALKPHOS 72 11/04/2017 1014   AST 17 11/04/2017 1014   ALT 14 11/04/2017 1014   BILITOT 0.7 11/04/2017 1014        IMPRESSION: 1. Mild-to-moderate hypermetabolism in the distal esophagus and along the GE junction. No paraesophageal, gastrohepatic ligament or celiac axis adenopathy. No findings for hepatic metastatic disease. 2. No evidence of pulmonary metastatic disease.  There are moderate bilateral pleural effusions with bibasilar atelectasis.  Impression and Plan:  67 year old gentleman with the following issues:  1.  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  Staging work-up did not reveal any evidence of metastatic disease.  He is currently receiving definitive therapy with radiation and weekly chemotherapy utilizing carboplatin and paclitaxel.  Today's is a week 3 of chemotherapy and ready to proceed.  Complication associated with this therapy was reviewed again including potential side effects to include nausea, fatigue, myelosuppression and infusion related complications.  He is agreeable to proceed at this time.  Upon completing his therapy, we will restage him and will consider definitive surgical therapy depending on his tolerance and cardiac clearance.  2.  IV access: We will use his peripheral veins consider Port-A-Cath insertion for an issue arises.  3.  Antiemetics:  Prescription for Compazine is made available to the patient.  4.  Iron deficiency anemia: Hemoglobin has corrected adequately.  We will continue to monitor his iron studies periodically.  5.  Prognosis: Treatment goal is curative at this time.  His response to therapy with dictate his overall long prognosis.  6.  Follow-up: We will be weekly for chemotherapy and I will have a clinical evaluation in 2 weeks.  Mikey Bussing, DNP, AGPCNP-BC, AOCNP 5/30/20199:58 AM

## 2017-11-11 NOTE — Patient Instructions (Signed)
   [facility] Discharge Instructions for Patients Receiving Chemotherapy  Today you received the following chemotherapy agents Taxol and Carboplatin   To help prevent nausea and vomiting after your treatment, we encourage you to take your nausea medication as directed.    If you develop nausea and vomiting that is not controlled by your nausea medication, call the clinic.   BELOW ARE SYMPTOMS THAT SHOULD BE REPORTED IMMEDIATELY:  *FEVER GREATER THAN 100.5 F  *CHILLS WITH OR WITHOUT FEVER  NAUSEA AND VOMITING THAT IS NOT CONTROLLED WITH YOUR NAUSEA MEDICATION  *UNUSUAL SHORTNESS OF BREATH  *UNUSUAL BRUISING OR BLEEDING  TENDERNESS IN MOUTH AND THROAT WITH OR WITHOUT PRESENCE OF ULCERS  *URINARY PROBLEMS  *BOWEL PROBLEMS  UNUSUAL RASH Items with * indicate a potential emergency and should be followed up as soon as possible.  Feel free to call the clinic should you have any questions or concerns. The clinic phone number is [phone].  Please show the CHEMO ALERT CARD at check-in to the Emergency Department and triage nurse.   

## 2017-11-12 ENCOUNTER — Ambulatory Visit: Payer: Medicare HMO | Admitting: Oncology

## 2017-11-12 ENCOUNTER — Ambulatory Visit
Admission: RE | Admit: 2017-11-12 | Discharge: 2017-11-12 | Disposition: A | Discharge status: Home / Self Care | Payer: Medicare HMO | Source: Ambulatory Visit | Attending: Radiation Oncology | Admitting: Radiation Oncology

## 2017-11-12 DIAGNOSIS — C155 Malignant neoplasm of lower third of esophagus: Secondary | ICD-10-CM | POA: Diagnosis not present

## 2017-11-12 DIAGNOSIS — Z510 Encounter for antineoplastic radiation therapy: Secondary | ICD-10-CM | POA: Diagnosis not present

## 2017-11-15 ENCOUNTER — Ambulatory Visit
Admission: RE | Admit: 2017-11-15 | Discharge: 2017-11-15 | Disposition: A | Discharge status: Home / Self Care | Payer: Medicare HMO | Source: Ambulatory Visit | Attending: Radiation Oncology | Admitting: Radiation Oncology

## 2017-11-15 DIAGNOSIS — D509 Iron deficiency anemia, unspecified: Secondary | ICD-10-CM | POA: Insufficient documentation

## 2017-11-15 DIAGNOSIS — R112 Nausea with vomiting, unspecified: Secondary | ICD-10-CM | POA: Insufficient documentation

## 2017-11-15 DIAGNOSIS — Z510 Encounter for antineoplastic radiation therapy: Secondary | ICD-10-CM | POA: Insufficient documentation

## 2017-11-15 DIAGNOSIS — C160 Malignant neoplasm of cardia: Secondary | ICD-10-CM | POA: Diagnosis not present

## 2017-11-15 DIAGNOSIS — C155 Malignant neoplasm of lower third of esophagus: Secondary | ICD-10-CM | POA: Diagnosis not present

## 2017-11-16 ENCOUNTER — Ambulatory Visit
Admission: RE | Admit: 2017-11-16 | Discharge: 2017-11-16 | Disposition: A | Discharge status: Home / Self Care | Payer: Medicare HMO | Source: Ambulatory Visit | Attending: Radiation Oncology | Admitting: Radiation Oncology

## 2017-11-16 DIAGNOSIS — C155 Malignant neoplasm of lower third of esophagus: Secondary | ICD-10-CM | POA: Diagnosis not present

## 2017-11-16 DIAGNOSIS — Z510 Encounter for antineoplastic radiation therapy: Secondary | ICD-10-CM | POA: Diagnosis not present

## 2017-11-16 DIAGNOSIS — D509 Iron deficiency anemia, unspecified: Secondary | ICD-10-CM | POA: Diagnosis not present

## 2017-11-16 DIAGNOSIS — C160 Malignant neoplasm of cardia: Secondary | ICD-10-CM | POA: Diagnosis not present

## 2017-11-16 DIAGNOSIS — R112 Nausea with vomiting, unspecified: Secondary | ICD-10-CM | POA: Diagnosis not present

## 2017-11-17 ENCOUNTER — Ambulatory Visit
Admission: RE | Admit: 2017-11-17 | Discharge: 2017-11-17 | Disposition: A | Discharge status: Home / Self Care | Payer: Medicare HMO | Source: Ambulatory Visit | Attending: Radiation Oncology | Admitting: Radiation Oncology

## 2017-11-17 DIAGNOSIS — R112 Nausea with vomiting, unspecified: Secondary | ICD-10-CM | POA: Diagnosis not present

## 2017-11-17 DIAGNOSIS — C155 Malignant neoplasm of lower third of esophagus: Secondary | ICD-10-CM | POA: Diagnosis not present

## 2017-11-17 DIAGNOSIS — Z510 Encounter for antineoplastic radiation therapy: Secondary | ICD-10-CM | POA: Diagnosis not present

## 2017-11-17 DIAGNOSIS — C160 Malignant neoplasm of cardia: Secondary | ICD-10-CM | POA: Diagnosis not present

## 2017-11-17 DIAGNOSIS — D509 Iron deficiency anemia, unspecified: Secondary | ICD-10-CM | POA: Diagnosis not present

## 2017-11-18 ENCOUNTER — Ambulatory Visit
Admission: RE | Admit: 2017-11-18 | Discharge: 2017-11-18 | Disposition: A | Discharge status: Home / Self Care | Payer: Medicare HMO | Source: Ambulatory Visit | Attending: Radiation Oncology | Admitting: Radiation Oncology

## 2017-11-18 ENCOUNTER — Inpatient Hospital Stay: Payer: Medicare HMO | Attending: Oncology

## 2017-11-18 ENCOUNTER — Other Ambulatory Visit: Payer: Self-pay | Admitting: Oncology

## 2017-11-18 ENCOUNTER — Inpatient Hospital Stay: Payer: Medicare HMO

## 2017-11-18 VITALS — BP 151/86 | HR 68 | Temp 99.0°F | Resp 18

## 2017-11-18 DIAGNOSIS — D509 Iron deficiency anemia, unspecified: Secondary | ICD-10-CM | POA: Diagnosis not present

## 2017-11-18 DIAGNOSIS — C155 Malignant neoplasm of lower third of esophagus: Secondary | ICD-10-CM

## 2017-11-18 DIAGNOSIS — R1310 Dysphagia, unspecified: Secondary | ICD-10-CM | POA: Insufficient documentation

## 2017-11-18 DIAGNOSIS — R112 Nausea with vomiting, unspecified: Secondary | ICD-10-CM | POA: Diagnosis not present

## 2017-11-18 DIAGNOSIS — Z5111 Encounter for antineoplastic chemotherapy: Secondary | ICD-10-CM | POA: Diagnosis present

## 2017-11-18 DIAGNOSIS — C160 Malignant neoplasm of cardia: Secondary | ICD-10-CM | POA: Diagnosis not present

## 2017-11-18 DIAGNOSIS — Z794 Long term (current) use of insulin: Secondary | ICD-10-CM | POA: Insufficient documentation

## 2017-11-18 DIAGNOSIS — Z510 Encounter for antineoplastic radiation therapy: Secondary | ICD-10-CM | POA: Diagnosis not present

## 2017-11-18 DIAGNOSIS — Z923 Personal history of irradiation: Secondary | ICD-10-CM | POA: Insufficient documentation

## 2017-11-18 DIAGNOSIS — R634 Abnormal weight loss: Secondary | ICD-10-CM | POA: Insufficient documentation

## 2017-11-18 DIAGNOSIS — Z7982 Long term (current) use of aspirin: Secondary | ICD-10-CM | POA: Insufficient documentation

## 2017-11-18 DIAGNOSIS — Z79899 Other long term (current) drug therapy: Secondary | ICD-10-CM | POA: Diagnosis not present

## 2017-11-18 LAB — CMP (CANCER CENTER ONLY)
ALK PHOS: 61 U/L (ref 40–150)
ALT: 11 U/L (ref 0–55)
AST: 16 U/L (ref 5–34)
Albumin: 3.3 g/dL — ABNORMAL LOW (ref 3.5–5.0)
Anion gap: 10 (ref 3–11)
BUN: 10 mg/dL (ref 7–26)
CHLORIDE: 103 mmol/L (ref 98–109)
CO2: 24 mmol/L (ref 22–29)
CREATININE: 0.92 mg/dL (ref 0.70–1.30)
Calcium: 8.3 mg/dL — ABNORMAL LOW (ref 8.4–10.4)
Glucose, Bld: 122 mg/dL (ref 70–140)
Potassium: 3.4 mmol/L — ABNORMAL LOW (ref 3.5–5.1)
Sodium: 137 mmol/L (ref 136–145)
Total Bilirubin: 0.8 mg/dL (ref 0.2–1.2)
Total Protein: 6.3 g/dL — ABNORMAL LOW (ref 6.4–8.3)

## 2017-11-18 LAB — CBC WITH DIFFERENTIAL (CANCER CENTER ONLY)
Basophils Absolute: 0 10*3/uL (ref 0.0–0.1)
Basophils Relative: 2 %
EOS ABS: 0 10*3/uL (ref 0.0–0.5)
EOS PCT: 1 %
HCT: 40 % (ref 38.4–49.9)
Hemoglobin: 12.6 g/dL — ABNORMAL LOW (ref 13.0–17.1)
LYMPHS ABS: 0.1 10*3/uL — AB (ref 0.9–3.3)
Lymphocytes Relative: 3 %
MCH: 22.8 pg — AB (ref 27.2–33.4)
MCHC: 31.4 g/dL — AB (ref 32.0–36.0)
MCV: 72.7 fL — ABNORMAL LOW (ref 79.3–98.0)
MONOS PCT: 23 %
Monocytes Absolute: 0.5 10*3/uL (ref 0.1–0.9)
NEUTROS PCT: 71 %
Neutro Abs: 1.5 10*3/uL (ref 1.5–6.5)
PLATELETS: 160 10*3/uL (ref 140–400)
RBC: 5.5 MIL/uL (ref 4.20–5.82)
RDW: 20 % — ABNORMAL HIGH (ref 11.0–14.6)
WBC: 2.1 10*3/uL — AB (ref 4.0–10.3)

## 2017-11-18 MED ORDER — PALONOSETRON HCL INJECTION 0.25 MG/5ML
INTRAVENOUS | Status: AC
  Filled 2017-11-18: qty 5

## 2017-11-18 MED ORDER — FAMOTIDINE IN NACL 20-0.9 MG/50ML-% IV SOLN
INTRAVENOUS | Status: AC
  Filled 2017-11-18: qty 50

## 2017-11-18 MED ORDER — DIPHENHYDRAMINE HCL 50 MG/ML IJ SOLN
INTRAMUSCULAR | Status: AC
  Filled 2017-11-18: qty 1

## 2017-11-18 MED ORDER — SODIUM CHLORIDE 0.9 % IV SOLN
180.0000 mg | Freq: Once | INTRAVENOUS | Status: AC
  Administered 2017-11-18: 180 mg via INTRAVENOUS
  Filled 2017-11-18: qty 18

## 2017-11-18 MED ORDER — PALONOSETRON HCL INJECTION 0.25 MG/5ML
0.2500 mg | Freq: Once | INTRAVENOUS | Status: AC
  Administered 2017-11-18: 0.25 mg via INTRAVENOUS

## 2017-11-18 MED ORDER — FAMOTIDINE IN NACL 20-0.9 MG/50ML-% IV SOLN
20.0000 mg | Freq: Once | INTRAVENOUS | Status: AC
  Administered 2017-11-18: 20 mg via INTRAVENOUS

## 2017-11-18 MED ORDER — DIPHENHYDRAMINE HCL 50 MG/ML IJ SOLN
50.0000 mg | Freq: Once | INTRAMUSCULAR | Status: AC
  Administered 2017-11-18: 50 mg via INTRAVENOUS

## 2017-11-18 MED ORDER — SODIUM CHLORIDE 0.9 % IV SOLN
20.0000 mg | Freq: Once | INTRAVENOUS | Status: AC
  Administered 2017-11-18: 20 mg via INTRAVENOUS
  Filled 2017-11-18: qty 2

## 2017-11-18 MED ORDER — SODIUM CHLORIDE 0.9 % IV SOLN
Freq: Once | INTRAVENOUS | Status: AC
  Administered 2017-11-18: 10:00:00 via INTRAVENOUS

## 2017-11-18 MED ORDER — SODIUM CHLORIDE 0.9 % IV SOLN
50.0000 mg/m2 | Freq: Once | INTRAVENOUS | Status: AC
  Administered 2017-11-18: 114 mg via INTRAVENOUS
  Filled 2017-11-18: qty 19

## 2017-11-18 NOTE — Patient Instructions (Signed)
   [facility] Discharge Instructions for Patients Receiving Chemotherapy  Today you received the following chemotherapy agents Taxol and Carboplatin   To help prevent nausea and vomiting after your treatment, we encourage you to take your nausea medication as directed.    If you develop nausea and vomiting that is not controlled by your nausea medication, call the clinic.   BELOW ARE SYMPTOMS THAT SHOULD BE REPORTED IMMEDIATELY:  *FEVER GREATER THAN 100.5 F  *CHILLS WITH OR WITHOUT FEVER  NAUSEA AND VOMITING THAT IS NOT CONTROLLED WITH YOUR NAUSEA MEDICATION  *UNUSUAL SHORTNESS OF BREATH  *UNUSUAL BRUISING OR BLEEDING  TENDERNESS IN MOUTH AND THROAT WITH OR WITHOUT PRESENCE OF ULCERS  *URINARY PROBLEMS  *BOWEL PROBLEMS  UNUSUAL RASH Items with * indicate a potential emergency and should be followed up as soon as possible.  Feel free to call the clinic should you have any questions or concerns. The clinic phone number is [phone].  Please show the CHEMO ALERT CARD at check-in to the Emergency Department and triage nurse.   

## 2017-11-19 ENCOUNTER — Ambulatory Visit
Admission: RE | Admit: 2017-11-19 | Discharge: 2017-11-19 | Disposition: A | Discharge status: Home / Self Care | Payer: Medicare HMO | Source: Ambulatory Visit | Attending: Radiation Oncology | Admitting: Radiation Oncology

## 2017-11-19 DIAGNOSIS — C155 Malignant neoplasm of lower third of esophagus: Secondary | ICD-10-CM | POA: Diagnosis not present

## 2017-11-19 DIAGNOSIS — C160 Malignant neoplasm of cardia: Secondary | ICD-10-CM | POA: Diagnosis not present

## 2017-11-19 DIAGNOSIS — D509 Iron deficiency anemia, unspecified: Secondary | ICD-10-CM | POA: Diagnosis not present

## 2017-11-19 DIAGNOSIS — R112 Nausea with vomiting, unspecified: Secondary | ICD-10-CM | POA: Diagnosis not present

## 2017-11-19 DIAGNOSIS — Z510 Encounter for antineoplastic radiation therapy: Secondary | ICD-10-CM | POA: Diagnosis not present

## 2017-11-22 ENCOUNTER — Ambulatory Visit
Admission: RE | Admit: 2017-11-22 | Discharge: 2017-11-22 | Disposition: A | Discharge status: Home / Self Care | Payer: Medicare HMO | Source: Ambulatory Visit | Attending: Radiation Oncology | Admitting: Radiation Oncology

## 2017-11-22 ENCOUNTER — Other Ambulatory Visit: Payer: Self-pay | Admitting: Radiation Oncology

## 2017-11-22 DIAGNOSIS — D509 Iron deficiency anemia, unspecified: Secondary | ICD-10-CM | POA: Diagnosis not present

## 2017-11-22 DIAGNOSIS — Z510 Encounter for antineoplastic radiation therapy: Secondary | ICD-10-CM | POA: Diagnosis not present

## 2017-11-22 DIAGNOSIS — R112 Nausea with vomiting, unspecified: Secondary | ICD-10-CM | POA: Diagnosis not present

## 2017-11-22 DIAGNOSIS — C160 Malignant neoplasm of cardia: Secondary | ICD-10-CM | POA: Diagnosis not present

## 2017-11-22 DIAGNOSIS — C155 Malignant neoplasm of lower third of esophagus: Secondary | ICD-10-CM | POA: Diagnosis not present

## 2017-11-22 MED ORDER — SUCRALFATE 1 G PO TABS: 1.0000 g | ORAL_TABLET | Freq: Three times a day (TID) | ORAL | 2 refills | Status: DC

## 2017-11-23 ENCOUNTER — Ambulatory Visit
Admission: RE | Admit: 2017-11-23 | Discharge: 2017-11-23 | Disposition: A | Discharge status: Home / Self Care | Payer: Medicare HMO | Source: Ambulatory Visit | Attending: Radiation Oncology | Admitting: Radiation Oncology

## 2017-11-23 DIAGNOSIS — R112 Nausea with vomiting, unspecified: Secondary | ICD-10-CM | POA: Diagnosis not present

## 2017-11-23 DIAGNOSIS — C155 Malignant neoplasm of lower third of esophagus: Secondary | ICD-10-CM | POA: Diagnosis not present

## 2017-11-23 DIAGNOSIS — D509 Iron deficiency anemia, unspecified: Secondary | ICD-10-CM | POA: Diagnosis not present

## 2017-11-23 DIAGNOSIS — C160 Malignant neoplasm of cardia: Secondary | ICD-10-CM | POA: Diagnosis not present

## 2017-11-23 DIAGNOSIS — Z510 Encounter for antineoplastic radiation therapy: Secondary | ICD-10-CM | POA: Diagnosis not present

## 2017-11-24 ENCOUNTER — Ambulatory Visit
Admission: RE | Admit: 2017-11-24 | Discharge: 2017-11-24 | Disposition: A | Discharge status: Home / Self Care | Payer: Medicare HMO | Source: Ambulatory Visit | Attending: Radiation Oncology | Admitting: Radiation Oncology

## 2017-11-24 DIAGNOSIS — C160 Malignant neoplasm of cardia: Secondary | ICD-10-CM | POA: Diagnosis not present

## 2017-11-24 DIAGNOSIS — D509 Iron deficiency anemia, unspecified: Secondary | ICD-10-CM | POA: Diagnosis not present

## 2017-11-24 DIAGNOSIS — R112 Nausea with vomiting, unspecified: Secondary | ICD-10-CM | POA: Diagnosis not present

## 2017-11-24 DIAGNOSIS — Z510 Encounter for antineoplastic radiation therapy: Secondary | ICD-10-CM | POA: Diagnosis not present

## 2017-11-24 DIAGNOSIS — C155 Malignant neoplasm of lower third of esophagus: Secondary | ICD-10-CM | POA: Diagnosis not present

## 2017-11-25 ENCOUNTER — Encounter: Payer: Self-pay | Admitting: Cardiothoracic Surgery

## 2017-11-25 ENCOUNTER — Inpatient Hospital Stay (HOSPITAL_BASED_OUTPATIENT_CLINIC_OR_DEPARTMENT_OTHER): Payer: Medicare HMO | Admitting: Oncology

## 2017-11-25 ENCOUNTER — Institutional Professional Consult (permissible substitution): Payer: Medicare HMO | Admitting: Cardiothoracic Surgery

## 2017-11-25 ENCOUNTER — Other Ambulatory Visit: Payer: Self-pay | Admitting: *Deleted

## 2017-11-25 ENCOUNTER — Inpatient Hospital Stay: Payer: Medicare HMO

## 2017-11-25 ENCOUNTER — Ambulatory Visit
Admission: RE | Admit: 2017-11-25 | Discharge: 2017-11-25 | Disposition: A | Discharge status: Home / Self Care | Payer: Medicare HMO | Source: Ambulatory Visit | Attending: Radiation Oncology | Admitting: Radiation Oncology

## 2017-11-25 ENCOUNTER — Other Ambulatory Visit: Payer: Self-pay

## 2017-11-25 ENCOUNTER — Other Ambulatory Visit: Payer: Self-pay | Admitting: Cardiology

## 2017-11-25 VITALS — BP 160/86 | HR 78 | Resp 16 | Ht 72.0 in | Wt 202.0 lb

## 2017-11-25 VITALS — BP 152/75 | HR 86 | Temp 98.1°F | Resp 17 | Ht 72.0 in | Wt 203.1 lb

## 2017-11-25 DIAGNOSIS — Z923 Personal history of irradiation: Secondary | ICD-10-CM

## 2017-11-25 DIAGNOSIS — C155 Malignant neoplasm of lower third of esophagus: Secondary | ICD-10-CM | POA: Diagnosis not present

## 2017-11-25 DIAGNOSIS — Z7982 Long term (current) use of aspirin: Secondary | ICD-10-CM | POA: Diagnosis not present

## 2017-11-25 DIAGNOSIS — R112 Nausea with vomiting, unspecified: Secondary | ICD-10-CM | POA: Diagnosis not present

## 2017-11-25 DIAGNOSIS — R1310 Dysphagia, unspecified: Secondary | ICD-10-CM

## 2017-11-25 DIAGNOSIS — Z79899 Other long term (current) drug therapy: Secondary | ICD-10-CM

## 2017-11-25 DIAGNOSIS — Z794 Long term (current) use of insulin: Secondary | ICD-10-CM

## 2017-11-25 DIAGNOSIS — D509 Iron deficiency anemia, unspecified: Secondary | ICD-10-CM

## 2017-11-25 DIAGNOSIS — C160 Malignant neoplasm of cardia: Secondary | ICD-10-CM | POA: Diagnosis not present

## 2017-11-25 DIAGNOSIS — C159 Malignant neoplasm of esophagus, unspecified: Secondary | ICD-10-CM

## 2017-11-25 DIAGNOSIS — Z5111 Encounter for antineoplastic chemotherapy: Secondary | ICD-10-CM | POA: Diagnosis not present

## 2017-11-25 DIAGNOSIS — Z951 Presence of aortocoronary bypass graft: Secondary | ICD-10-CM

## 2017-11-25 DIAGNOSIS — R634 Abnormal weight loss: Secondary | ICD-10-CM

## 2017-11-25 DIAGNOSIS — Z510 Encounter for antineoplastic radiation therapy: Secondary | ICD-10-CM | POA: Diagnosis not present

## 2017-11-25 LAB — CBC WITH DIFFERENTIAL (CANCER CENTER ONLY)
BASOS ABS: 0 10*3/uL (ref 0.0–0.1)
BASOS PCT: 1 %
Eosinophils Absolute: 0 10*3/uL (ref 0.0–0.5)
Eosinophils Relative: 1 %
HEMATOCRIT: 40.4 % (ref 38.4–49.9)
Hemoglobin: 12.7 g/dL — ABNORMAL LOW (ref 13.0–17.1)
Lymphocytes Relative: 12 %
Lymphs Abs: 0.2 10*3/uL — ABNORMAL LOW (ref 0.9–3.3)
MCH: 23 pg — ABNORMAL LOW (ref 27.2–33.4)
MCHC: 31.4 g/dL — AB (ref 32.0–36.0)
MCV: 73.2 fL — ABNORMAL LOW (ref 79.3–98.0)
MONO ABS: 0.3 10*3/uL (ref 0.1–0.9)
MONOS PCT: 15 %
NEUTROS ABS: 1.2 10*3/uL — AB (ref 1.5–6.5)
Neutrophils Relative %: 71 %
PLATELETS: 101 10*3/uL — AB (ref 140–400)
RBC: 5.52 MIL/uL (ref 4.20–5.82)
RDW: 18.7 % — AB (ref 11.0–14.6)
WBC Count: 1.7 10*3/uL — ABNORMAL LOW (ref 4.0–10.3)

## 2017-11-25 LAB — CMP (CANCER CENTER ONLY)
ALBUMIN: 3.2 g/dL — AB (ref 3.5–5.0)
ALK PHOS: 65 U/L (ref 40–150)
ALT: 12 U/L (ref 0–55)
ANION GAP: 9 (ref 3–11)
AST: 16 U/L (ref 5–34)
BUN: 13 mg/dL (ref 7–26)
CALCIUM: 8.7 mg/dL (ref 8.4–10.4)
CO2: 26 mmol/L (ref 22–29)
CREATININE: 0.82 mg/dL (ref 0.70–1.30)
Chloride: 105 mmol/L (ref 98–109)
GFR, Est AFR Am: >60 mL/min (ref >60)
GFR, Estimated: >60 mL/min (ref >60)
GLUCOSE: 144 mg/dL — AB (ref 70–140)
Potassium: 3.2 mmol/L — ABNORMAL LOW (ref 3.5–5.1)
SODIUM: 140 mmol/L (ref 136–145)
TOTAL PROTEIN: 6.2 g/dL — AB (ref 6.4–8.3)
Total Bilirubin: 0.7 mg/dL (ref 0.2–1.2)

## 2017-11-25 MED ORDER — FAMOTIDINE IN NACL 20-0.9 MG/50ML-% IV SOLN
INTRAVENOUS | Status: AC
Start: 2017-11-25 — End: ?
  Filled 2017-11-25: qty 50

## 2017-11-25 MED ORDER — SODIUM CHLORIDE 0.9 % IV SOLN
180.0000 mg | Freq: Once | INTRAVENOUS | Status: AC
  Administered 2017-11-25: 180 mg via INTRAVENOUS
  Filled 2017-11-25: qty 18

## 2017-11-25 MED ORDER — PALONOSETRON HCL INJECTION 0.25 MG/5ML
0.2500 mg | Freq: Once | INTRAVENOUS | Status: AC
  Administered 2017-11-25: 0.25 mg via INTRAVENOUS

## 2017-11-25 MED ORDER — FAMOTIDINE IN NACL 20-0.9 MG/50ML-% IV SOLN
20.0000 mg | Freq: Once | INTRAVENOUS | Status: AC
  Administered 2017-11-25: 20 mg via INTRAVENOUS

## 2017-11-25 MED ORDER — SODIUM CHLORIDE 0.9 % IV SOLN
Freq: Once | INTRAVENOUS | Status: AC
  Administered 2017-11-25: 10:00:00 via INTRAVENOUS

## 2017-11-25 MED ORDER — SODIUM CHLORIDE 0.9 % IV SOLN
50.0000 mg/m2 | Freq: Once | INTRAVENOUS | Status: AC
  Administered 2017-11-25: 114 mg via INTRAVENOUS
  Filled 2017-11-25: qty 19

## 2017-11-25 MED ORDER — SODIUM CHLORIDE 0.9 % IV SOLN
20.0000 mg | Freq: Once | INTRAVENOUS | Status: AC
  Administered 2017-11-25: 20 mg via INTRAVENOUS
  Filled 2017-11-25: qty 2

## 2017-11-25 MED ORDER — PALONOSETRON HCL INJECTION 0.25 MG/5ML
INTRAVENOUS | Status: AC
  Filled 2017-11-25: qty 5

## 2017-11-25 MED ORDER — DIPHENHYDRAMINE HCL 50 MG/ML IJ SOLN
INTRAMUSCULAR | Status: AC
  Filled 2017-11-25: qty 1

## 2017-11-25 MED ORDER — DIPHENHYDRAMINE HCL 50 MG/ML IJ SOLN
50.0000 mg | Freq: Once | INTRAMUSCULAR | Status: AC
  Administered 2017-11-25: 50 mg via INTRAVENOUS

## 2017-11-25 NOTE — Patient Instructions (Signed)
   [facility] Discharge Instructions for Patients Receiving Chemotherapy  Today you received the following chemotherapy agents Taxol and Carboplatin   To help prevent nausea and vomiting after your treatment, we encourage you to take your nausea medication as directed.    If you develop nausea and vomiting that is not controlled by your nausea medication, call the clinic.   BELOW ARE SYMPTOMS THAT SHOULD BE REPORTED IMMEDIATELY:  *FEVER GREATER THAN 100.5 F  *CHILLS WITH OR WITHOUT FEVER  NAUSEA AND VOMITING THAT IS NOT CONTROLLED WITH YOUR NAUSEA MEDICATION  *UNUSUAL SHORTNESS OF BREATH  *UNUSUAL BRUISING OR BLEEDING  TENDERNESS IN MOUTH AND THROAT WITH OR WITHOUT PRESENCE OF ULCERS  *URINARY PROBLEMS  *BOWEL PROBLEMS  UNUSUAL RASH Items with * indicate a potential emergency and should be followed up as soon as possible.  Feel free to call the clinic should you have any questions or concerns. The clinic phone number is [phone].  Please show the CHEMO ALERT CARD at check-in to the Emergency Department and triage nurse.   

## 2017-11-25 NOTE — Progress Notes (Signed)
Per Dr. Alen Blew, it's OK to treat with today's labs. Infusion room nurse notified.

## 2017-11-25 NOTE — Progress Notes (Signed)
[address]       [place] [address]             [place] Record #[id number] Date of Birth: Dec 08, 1950  Referring: Hayden Pedro,* Primary Care: Practice, [facility] Primary Cardiologist: [name], MD  Chief Complaint:    Chief Complaint  Patient presents with  . Esophageal Cancer    GE JUNCTION...presently receiving chemo/radiaition...  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 67 y.o. male is seen in the office  today for surgical evaluation of the distal esophageal adenocarcinoma,  by EUS staging T3.  He is a 67 year old male who I did coronary artery bypass grafting in 1998 and then subsequent redo with the right internal mammary to the right in 2007 his first bypass was at age 29.  He had stents placed in 2013 with intact right and left internal mammary grafts patent.  In March of this year he had repeat stenting with stents placed in the left main coronary artery and was started on Plavix.  Subsequent to this he developed GI bleed and was diagnosed with a distal esophageal adenocarcinoma, by EUS staging T3.     Radiation and weekly carboplatin/paclitaxel.  Week 1 of chemotherapy to start on 10/28/2017. apy. He is receiving therapy with radiation as well as weekly chemotherapy utilizing carboplatin and paclitaxel.  Today is week 5 of possible 6 weeks of treatment.  He continues to tolerate therapy reasonably with complications including dysphagia and weight loss. He will complete radiation and chemotherapy treatments June 20.  With treatment he has had significant weight loss approximately 30 pounds over the last 5 to 6 weeks, notes 14 pound weight  loss in the last week or so.  [phone]  Esophagogastric junction, biopsy - POORLY DIFFERENTIATED ADENOCARCINOMA WITH MUCINOUS AND SIGNET RING CELL FEATURES.   He had an LIMA to the LAD and saphenous vein graft to the RCA in 2007. He has a persistently patent RIMA to the RCA from 2003. He is also had a saphenous vein graft to the first and second diagonals and prior obtuse marginal #1 stenting in 2004.   Current Activity/ Functional Status:  Patient is independent with mobility/ambulation, transfers, ADL's, IADL's.   Zubrod Score: At the time of surgery this patient's most appropriate activity status/level should be described as: []     0    Normal activity, no symptoms [x]     1    Restricted in physical strenuous activity but ambulatory, able to do out light work []     2    Ambulatory and capable of self care, unable to do work activities, up and about               >50 % of waking hours                              []     3    Only limited self care, in bed greater than 50% of waking hours []     4  Completely disabled, no self care, confined to bed or chair []     5    Moribund   Past Medical History:  Diagnosis Date  . Angina   . Arthritis    "hands" (08/10/2017)  . Atrial flutter, paroxysmal ([place])    arrived with aflutter with RVR, EF down to 25-30%, s/p TEE DCCV on 01/29/2015  . Chronic lower back pain    "all the time from my sciatic nerve"  . Coronary artery disease   . Erectile dysfunction   . GERD (gastroesophageal reflux disease)   . Heart failure ([place])   . Hypercholesteremia   . Hypertension   . 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 Myocardial infarction ([place]) 1988; ~9379;  ~1996;~ 2000  . Obesity   . Peripheral vascular disease ([place])   . Umbilical hernia    unrepaired    Past Surgical History:  Procedure Laterality Date  . CARDIOVERSION N/A 01/29/2015   Procedure: CARDIOVERSION;  Surgeon: [name], MD;  Location: [facility];   Service: Cardiovascular;  Laterality: N/A;  . CORONARY ANGIOPLASTY WITH STENT PLACEMENT  08/31/11   "2 today; makes total of 3 stents"  . CORONARY ANGIOPLASTY WITH STENT PLACEMENT  08/10/2017   "2 today; makes a total of 5 stents" (08/10/2017)  . CORONARY ARTERY BYPASS GRAFT  1998; 2007   CABG X 4; CABG X4  . CORONARY STENT INTERVENTION N/A 08/10/2017   Procedure: CORONARY STENT INTERVENTION;  Surgeon: Sherren Mocha, MD;  Location: [place] [facility];  Service: Cardiovascular;  Laterality: N/A;  . ESOPHAGOGASTRODUODENOSCOPY (EGD) WITH PROPOFOL N/A 09/01/2017   Procedure: ESOPHAGOGASTRODUODENOSCOPY (EGD) WITH PROPOFOL;  Surgeon: Laurence Spates, MD;  Location: [facility];  Service: Endoscopy;  Laterality: N/A;  . EUS N/A 09/29/2017   Procedure: UPPER ENDOSCOPIC ULTRASOUND (EUS) LINEAR;  Surgeon: Arta Silence, MD;  Location: [facility];  Service: Endoscopy;  Laterality: N/A;  . LEFT HEART CATH AND CORS/GRAFTS ANGIOGRAPHY N/A 08/10/2017   Procedure: LEFT HEART CATH AND CORS/GRAFTS ANGIOGRAPHY;  Surgeon: Sherren Mocha, MD;  Location: [place] [facility];  Service: Cardiovascular;  Laterality: N/A;  . LEFT HEART CATHETERIZATION WITH CORONARY/GRAFT ANGIOGRAM N/A 08/31/2011   Procedure: LEFT HEART CATHETERIZATION WITH Beatrix Fetters;  Surgeon: [name], MD;  Location: [facility];  Service: Cardiovascular;  Laterality: N/A;  . PERIPHERAL VASCULAR CATHETERIZATION N/A 04/27/2016   Procedure: Abdominal Aortogram w/Lower Extremity;  Surgeon: Angelia Mould, MD;  Location: [place] [facility];  Service: Cardiovascular;  Laterality: N/A;  . TEE WITHOUT CARDIOVERSION N/A 01/29/2015   Procedure: TRANSESOPHAGEAL ECHOCARDIOGRAM (TEE);  Surgeon: [name], MD;  Location: [facility] ENDOSCOPY;  Service: Cardiovascular;  Laterality: N/A;    Family History  Problem Relation Age of Onset  . Heart disease Mother   . Heart attack Mother   . Diabetes Mother   . Heart disease Father   .  Heart attack Father   . Diabetes Father   . Heart attack Brother   . Stroke Neg Hx      Social History   Tobacco Use  Smoking Status Former Smoker  . Packs/day: 2.00  . Years: 6.00  . Pack years: 12.00  . Types: Cigarettes  . Last attempt to quit: 06/16/1971  . Years since quitting: 46.4  Smokeless Tobacco Never Used  Tobacco Comment   "chewed on cigars; never really did chew tobacco"    Social History   Substance and Sexual Activity  Alcohol Use No     No  Known Allergies  Current Outpatient Medications  Medication Sig Dispense Refill  . acetaminophen (TYLENOL) 325 MG tablet Take 2 tablets (650 mg total) by mouth every 6 (six) hours as needed for mild pain or headache.    [name] aspirin EC 81 MG EC tablet Take 1 tablet (81 mg total) by mouth daily.    [name] atorvastatin (LIPITOR) 40 MG tablet Take 40 mg by mouth daily at 6 PM.   3  . candesartan (ATACAND) 16 MG tablet Take 1 tablet (16 mg total) by mouth daily. 90 tablet 3  . carvedilol (COREG) 12.5 MG tablet TAKE 1 TABLET (12.5 MG TOTAL) BY MOUTH 2 (TWO) TIMES DAILY WITH A MEAL. 60 tablet 3  . clopidogrel (PLAVIX) 75 MG tablet Take 1 tablet (75 mg total) by mouth daily. Take for 6 months. Plan to stop on August 27th. Check with cardiologist at that time. 30 tablet 5  . diltiazem (CARDIZEM CD) 120 MG 24 hr capsule Take 1 capsule (120 mg total) by mouth daily. 30 capsule 11  . ferrous sulfate 325 (65 FE) MG tablet Take 325 mg by mouth daily.  3  . furosemide (LASIX) 20 MG tablet Take 2 tablets (40mg  total) by mouth Monday, Wednesday and Friday. Take 1 tablet by mouth daily on Tuesday, Thursday, Saturday and Sunday. 270 tablet 3  . gabapentin (NEURONTIN) 300 MG capsule Take 300 mg by mouth 2 (two) times daily.  1  . LANTUS SOLOSTAR 100 UNIT/ML Solostar Pen Inject 50 Units into the skin daily.   3  . pantoprazole (PROTONIX) 40 MG tablet Take 1 tablet (40 mg total) by mouth daily. 30 tablet 11  . prochlorperazine (COMPAZINE) 10 MG tablet  Take 1 tablet (10 mg total) by mouth every 6 (six) hours as needed for nausea or vomiting. 30 tablet 1  . sitaGLIPtin (JANUVIA) 100 MG tablet Take 100 mg by mouth daily.    . sucralfate (CARAFATE) 1 g tablet Take 1 tablet (1 g total) by mouth 4 (four) times daily -  with meals and at bedtime. 5 min before meals for radiation induced esophagitis 120 tablet 2  . sucralfate (CARAFATE) 1 GM/10ML suspension Take 10 mLs (1 g total) by mouth 4 (four) times daily -  with meals and at bedtime. 420 mL 0  . nitroGLYCERIN (NITROSTAT) 0.4 MG SL tablet Place 1 tablet (0.4 mg total) under the tongue every 5 (five) minutes as needed for chest pain (Angina). (Patient not taking: Reported on 11/25/2017) 25 tablet 3   No current facility-administered medications for this visit.     Pertinent items are noted in HPI.   Review of Systems:     Cardiac Review of Systems: [Y] = yes  or   [ N ] = no   Chest Pain [   y ]  Resting SOB [  y ] Exertional SOB  [ y ]  Orthopnea [  y]   Pedal Edema [ n  ]    Palpitations [ n ] Syncope  [n  ]   Presyncope Blue.[email or web address]   ]   General Review of Systems: [Y] = yes [  ]=no Constitional: recent weight change [ y ];  Wt loss over the last 3 months [   ] anorexia [  ]; fatigue [  y]; nausea [  ]; night sweats [  ]; fever [  ]; or chills [  ];           Eye : blurred vision Blue.Reese  ];  diplopia [   ]; vision changes [  ];  Amaurosis fugax[  ]; Resp: cough [  ];  wheezing[  ];  hemoptysis[  ]; shortness of breath[  ]; paroxysmal nocturnal dyspnea[  ]; dyspnea on exertion[  ]; or orthopnea[  ];  GI:  gallstones[  ], vomiting[  ];  dysphagia[ y ]; melena[  ];  hematochezia [  ]; heartburn[  ];   Hx of  Colonoscopy[  ]; GU: kidney stones [  ]; hematuria[  ];   dysuria [  ];  nocturia[  ];  history of     obstruction [  ]; urinary frequency [  ]             Skin: rash, swelling[  ];, hair loss[  ];  peripheral edema[  ];  or itching[  ]; Musculosketetal: myalgias[  ];  joint swelling[  ];  joint erythema[   ];  joint pain[  ];  back pain[  ];  Heme/Lymph: bruising[  ];  bleeding[  ];  anemia[  ];  Neuro: TIA[  ];  headaches[  ];  stroke[  ];  vertigo[  ];  seizures[  ];   paresthesias[  ];  difficulty walking[  ];  Psych:depression[  ]; anxiety[  ];  Endocrine: diabetes[ y ];  thyroid dysfunction[  ];  Immunizations: Flu up to date [n  ]; Pneumococcal up to date [ n ];  Other:     PHYSICAL EXAMINATION: BP (!) 160/86 (BP Location: Right Arm, Patient Position: Sitting, Cuff Size: Normal)   Pulse 78   Resp 16   Ht 6' (1.829 m)   Wt 202 lb (91.6 kg)   SpO2 97% Comment: ON RA  BMI 27.40 kg/m  General appearance: alert, cooperative, appears older than stated age and no distress Head: Normocephalic, without obvious abnormality, atraumatic Neck: no adenopathy, no carotid bruit, no JVD, supple, symmetrical, trachea midline and thyroid not enlarged, symmetric, no tenderness/mass/nodules Lymph nodes: Cervical, supraclavicular, and axillary nodes normal. Resp: clear to auscultation bilaterally Cardio: regular rate and rhythm, S1, S2 normal, no murmur, click, rub or gallop GI: soft, non-tender; bowel sounds normal; no masses,  no organomegaly and Moderately protuberant abdomen without incisions, moderate size reducible umbilical hernia Extremities: extremities normal, atraumatic, no cyanosis or edema and Homans sign is negative, no sign of DVT Neurologic: Grossly normal  Diagnostic Studies & Laboratory data:     Recent Radiology Findings:  CLINICAL DATA:  Initial treatment strategy for esophageal cancer.  EXAM: NUCLEAR MEDICINE PET SKULL BASE TO THIGH  TECHNIQUE: 12.46 mCi F-18 FDG was injected intravenously. Full-ring PET imaging was performed from the skull base to thigh after the radiotracer. CT data was obtained and used for attenuation correction and anatomic localization.  Fasting blood glucose: 145 mg/dl  COMPARISON:  None.  FINDINGS: Mediastinal blood pool activity: SUV  max 2.41  NECK: No hypermetabolic lymph nodes in the neck.  Incidental CT findings: none  CHEST: Mild hypermetabolism noted in the distal esophagus and along the GE junction consistent with patient's known cancer. SUV max is 5.2. No paraesophageal, mediastinal or upper abdominal adenopathy.  No hypermetabolic lung lesions. No worrisome pulmonary nodules on the CT scan.  Incidental CT findings: Moderate-sized bilateral pleural effusions with bibasilar atelectasis, right greater than left.  Cardiac enlargement with surgical changes from coronary artery bypass surgery. Extensive three-vessel coronary artery calcifications.  ABDOMEN/PELVIS: No abnormal hypermetabolic activity within the liver, pancreas, adrenal glands, or spleen. No hypermetabolic lymph nodes  in the abdomen or pelvis.  No enlarged or hypermetabolic gastrohepatic ligament, celiac axis or periportal lymph nodes.  Incidental CT findings: Cholelithiasis is noted. A right renal calculus is noted.  SKELETON: No focal hypermetabolic activity to suggest skeletal metastasis.  Incidental CT findings: none  IMPRESSION: 1. Mild-to-moderate hypermetabolism in the distal esophagus and along the GE junction. No paraesophageal, gastrohepatic ligament or celiac axis adenopathy. No findings for hepatic metastatic disease. 2. No evidence of pulmonary metastatic disease. There are moderate bilateral pleural effusions with bibasilar atelectasis.   Electronically Signed   By: Marijo Sanes M.D.   On: 09/16/2017 15:15  I have independently reviewed the above radiology studies  and reviewed the findings with the patient.   Recent Lab Findings: Lab Results  Component Value Date   WBC 1.7 (L) 11/25/2017   HGB 12.7 (L) 11/25/2017   HCT 40.4 11/25/2017   PLT 101 (L) 11/25/2017   GLUCOSE 144 (H) 11/25/2017   CHOL 129 09/21/2017   TRIG 141 09/21/2017   HDL 31 (L) 09/21/2017   LDLCALC 70 09/21/2017   ALT 12  11/25/2017   AST 16 11/25/2017   NA 140 11/25/2017   K 3.2 (L) 11/25/2017   CL 105 11/25/2017   CREATININE 0.82 11/25/2017   BUN 13 11/25/2017   CO2 26 11/25/2017   TSH 0.671 08/30/2017   INR 1.2 08/05/2017   HGBA1C 9.8 (H) 01/28/2015   ENDOSCOPIC FINDING: Findings: A large, fungating and ulcerating mass with small bleeding upon direct contact was found at the gastroesophageal junction with extension into the cardia. The mass was partially obstructing and partially circumferential (involving two thirds of the lumen circumference). ENDOSONOGRAPHIC FINDING: There was no sign of significant endosonographic abnormality in the left lobe of the liver. There was no sign of significant endosonographic abnormality in the pancreatic body and pancreatic tail. No lymphadenopathy seen. A heterogenous mass was found in the gastroesophageal junction. The lesion was partially circumferential (involving 70% of the lumen). The endosonographic borders were irregular. There wassonographic evidence suggesting invasion into the muscularis propria (Layer 4) diffusely, and several patches where there was invasion through the muscularis propria. No neighboring adenopathy seen. No celiac adenopathy noted. - Partially obstructing, malignant esophageal tumor was found at the gastroesophageal junction with extension into the cardia. - There was no evidence of significant pathology in the left lobe of the liver. - There was no sign of significant pathology in the pancreatic body and pancreatic tail. - No vascular involvement of celiac artery. - A mass was found in the gastroesophageal junction. A tissue diagnosis was obtained prior to this exam. This is of adenocarcinoma. This was staged T3 N0 Mx by endosonographic criteria.  Recent Cardiac Cath: Procedures   CORONARY STENT INTERVENTION  LEFT HEART CATH AND CORS/GRAFTS ANGIOGRAPHY  Conclusion     Ost LAD lesion is 100% stenosed.  Prox RCA lesion is  100% stenosed.  Ost LM to Dist LM lesion is 75% stenosed.  Prox Cx to Mid Cx lesion is 25% stenosed.  Prox Cx lesion is 70% stenosed.  LIMA graft was visualized by angiography and is normal in caliber.  The graft exhibits no disease.  RIMA graft was visualized by non-selective angiography.  A drug-eluting stent was successfully placed using a STENT SYNERGY DES 3X24.  Post intervention, there is a 0% residual stenosis.  Post intervention, there is a 0% residual stenosis.  A drug-eluting stent was successfully placed.  Post intervention, there is a 0% residual stenosis.   1.  Severe native 3 vessel CAD with total occlusion of the RCA and LAD, severe stenosis of the native left main and native circumflex 2. Successful PCI of the left main and LCx using DES platforms  Recommend: ASA 81 mg x 1 month, plavix 75 mg daily, and resume eliquis tomorrow   Cath 2013 1. Advanced coronary atherosclerotic disease with total occlusion of the LAD and native right coronary. Total occlusion of the circumflex after the large first obtuse marginal. The obtuse marginal #1 contains an eccentric 80-90% stenosis.  2. Successful stenting of the obtuse marginal to 0% with TIMI grade 3 flow.  3. Left ventricular dysfunction, with LVEF 40%, and inferior wall motion abnormality.  4. Widely patent right internal mammary and left internal mammary graft to the right coronary and LAD respectively.  5. Total occlusion of all previously placed saphenous vein grafts  Transthoracic Echocardiography  Patient:    Geovani, Tootle MR #:       [id number] Study Date: 08/31/2017 Gender:     M Age:        35 Height:     180.3 cm Weight:     101.6 kg BSA:        2.28 m^2 Pt. Status: Room:       2C01C   ADMITTING    Peter Martinique, M.D.  ATTENDING    Peter Martinique, M.D.  Ernestene Mention, Brittainy M  REFERRING    Rosita Fire, Brittainy M  PERFORMING   [facility], Inpatient  SONOGRAPHER  Jannett Celestine,  RDCS  cc:  ------------------------------------------------------------------- LV EF: 45% -   50%  ------------------------------------------------------------------- History:   PMH:  Atrial Flutter 427.32. paroxysmal Atrial fibrillation.  Coronary artery disease.  PMH:   Myocardial infarction.  ------------------------------------------------------------------- Study Conclusions  - Left ventricle: The cavity size was normal. There was mild   concentric hypertrophy. Systolic function was mildly reduced. The   estimated ejection fraction was in the range of 45% to 50%.   Hypokinesis of the basal-midinferior and inferoseptal myocardium. - Mitral valve: Calcified annulus. There was mild regurgitation   directed centrally. - Left atrium: The atrium was mildly to moderately dilated. - Right ventricle: The cavity size was mildly dilated. Wall   thickness was normal. Systolic function was moderately reduced. - Right atrium: The atrium was mildly dilated.  ------------------------------------------------------------------- Labs, prior tests, procedures, and surgery: Coronary artery bypass grafting.  ------------------------------------------------------------------- Study data:  Comparison was made to the study of 02/27/2015.  Study status:  Routine.  Procedure:  The patient reported no pain pre or post test. Transthoracic echocardiography. Image quality was adequate.          Transthoracic echocardiography.  M-mode, complete 2D, spectral Doppler, and color Doppler.  Birthdate: Patient birthdate: Jun 18, 1950.  Age:  Patient is 67 yr old.  Sex: Gender: male.    BMI: 31.3 kg/m^2.  Blood pressure:     115/73 Patient status:  Inpatient.  Study date:  Study date: 08/31/2017. Study time: 10:45 AM.  Location:  Bedside.  -------------------------------------------------------------------  ------------------------------------------------------------------- Left ventricle:  The  cavity size was normal. There was mild concentric hypertrophy. Systolic function was mildly reduced. The estimated ejection fraction was in the range of 45% to 50%. Regional wall motion abnormalities:   Hypokinesis of the basal-midinferior and inferoseptal myocardium. The study was not technically sufficient to allow evaluation of LV diastolic dysfunction due to atrial fibrillation.  ------------------------------------------------------------------- Aortic valve:   Trileaflet; normal thickness leaflets. Mobility was not restricted.  Doppler:  Transvalvular velocity was within the normal range. There was no stenosis. There was no regurgitation.   ------------------------------------------------------------------- Aorta:  Aortic root: The aortic root was normal in size.  ------------------------------------------------------------------- Mitral valve:   Calcified annulus. Mobility was not restricted. Doppler:  Transvalvular velocity was within the normal range. There was no evidence for stenosis. There was mild regurgitation directed centrally.    Peak gradient (D): 9 mm Hg.  ------------------------------------------------------------------- Left atrium:  The atrium was mildly to moderately dilated.   ------------------------------------------------------------------- Right ventricle:  The cavity size was mildly dilated. Wall thickness was normal. Systolic function was moderately reduced.   ------------------------------------------------------------------- Pulmonic valve:   Poorly visualized.  The valve appears to be grossly normal.    Doppler:  Transvalvular velocity was within the normal range. There was no evidence for stenosis.  ------------------------------------------------------------------- Tricuspid valve:   Structurally normal valve.    Doppler: Transvalvular velocity was within the normal range. There was  no regurgitation.  ------------------------------------------------------------------- Pulmonary artery:   The main pulmonary artery was normal-sized. Systolic pressure was within the normal range.  ------------------------------------------------------------------- Right atrium:  The atrium was mildly dilated.  ------------------------------------------------------------------- Pericardium:  There was no pericardial effusion.  ------------------------------------------------------------------- Systemic veins: Inferior vena cava: The vessel was normal in size.  ------------------------------------------------------------------- Measurements   Left ventricle                            Value         Reference  LV ID, ED, PLAX chordal                   46.4   mm     43 - 52  LV ID, ES, PLAX chordal                   34.4   mm     23 - 38  LV fx shortening, PLAX chordal    (L)     26     %      >=29  LV PW thickness, ED                       12.6   mm     ----------  IVS/LV PW ratio, ED                       1.11          <=1.3  Stroke volume, 2D                         67     ml     ----------  Stroke volume/bsa, 2D                     29     ml/m^2 ----------    Ventricular septum                        Value         Reference  IVS thickness, ED                         14     mm     ----------    LVOT  Value         Reference  LVOT ID, S                                22     mm     ----------  LVOT area                                 3.8    cm^2   ----------  LVOT peak velocity, S                     88.8   cm/s   ----------  LVOT mean velocity, S                     61.4   cm/s   ----------  LVOT VTI, S                               17.5   cm     ----------    Aorta                                     Value         Reference  Aortic root ID, ED                        34     mm     ----------    Left atrium                                Value         Reference  LA ID, A-P, ES                            44     mm     ----------  LA ID/bsa, A-P                            1.93   cm/m^2 <=2.2  LA volume, ES, 1-p A4C                    94.4   ml     ----------  LA volume/bsa, ES, 1-p A4C                41.3   ml/m^2 ----------  LA volume, ES, 1-p A2C                    85     ml     ----------  LA volume/bsa, ES, 1-p A2C                37.2   ml/m^2 ----------    Mitral valve                              Value         Reference  Mitral E-wave peak velocity  150    cm/s   ----------  Mitral deceleration time                  165    ms     150 - 230  Mitral peak gradient, D                   9      mm Hg  ----------    Pulmonary arteries                        Value         Reference  PA pressure, S, DP                        25     mm Hg  <=30    Tricuspid valve                           Value         Reference  Tricuspid regurg peak velocity            234.42 cm/s   ----------  Tricuspid peak RV-RA gradient             22     mm Hg  ----------  Tricuspid maximal regurg                  234.42 cm/s   ----------  velocity, PISA    Right atrium                              Value         Reference  RA ID, S-I, ES, A4C               (H)     55.1   mm     34 - 49  RA area, ES, A4C                          18.3   cm^2   8.3 - 19.5  RA volume, ES, A/L                        49.7   ml     ----------  RA volume/bsa, ES, A/L                    21.8   ml/m^2 ----------    Systemic veins                            Value         Reference  Estimated CVP                             3      mm Hg  ----------    Right ventricle                           Value         Reference  RV ID, minor axis, ED, A4C base           43.6   mm     ----------  RV ID, minor axis, ED, A4C mid            48.8   mm     ----------  RV ID, major axis, ED, A4C                77.7   mm     55 - 91  TAPSE                                     7.32    mm     ----------  RV pressure, S, DP                        25     mm Hg  <=30  RV s&', lateral, S                         5.76   cm/s   ----------  Legend: (L)  and  (H)  mark values outside specified reference range.  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  He presented with GI bleeding and found to have a T3N0 by endoscopic ultrasound criteria.  Currently undergoing chemotherapy and radiation treatments to be completed June 20.  4 to 5 weeks following the completion of his treatments we will obtain a repeat Pete staging CT scan of the chest abdomen and pelvis.  We can consider surgical resection depending on the scan findings, but more importantly the patient's major risk involving surgery will have to do with his cardiac risk, considering he is known severe coronary occlusive disease and placement of a recent drug-eluting stent in the left main.  He will have to be off Plavix prior to consideration of surgical resection-I have reviewed with the patient and his wife the risks and options of surgical resection of his distal esophagus and GE junction.  They were somewhat disappointed in this information as they were under the impression that the tumor could just be "scraped out".  They were given printed material concerning esophagectomy    He is receiving therapy with radiation as well as weekly chemotherapy utilizing carboplatin and paclitaxel.  Today is week 5 of possible 6 weeks of treatment.  He continues to tolerate therapy reasonably with complications including dysphagia and weight loss.  Bilateral pleural effusions, if her persistent on follow-up scans will need thoracentesis and check for malignancy  I  spent 60 minutes with  the patient face to face and greater then 50% of the  time was spent in counseling and coordination of care.    Grace Isaac MD      [address] ,[place] [address] Office [phone]   Beeper [phone]  11/25/2017 2:58 PM

## 2017-11-25 NOTE — Progress Notes (Unsigned)
pft  

## 2017-11-25 NOTE — Progress Notes (Signed)
Hematology and Oncology Follow Up Visit  Justin Blackburn [id number] April 15, 1951 67 y.o. 11/25/2017 8:29 AM Practice, [place] Diagnosis: 67 year old man with adenocarcinoma of the GE junction diagnosed in March 2019.  He presented with a T3N0 disease and iron deficiency anemia.  Prior Therapy: He is status post endoscopy and a biopsy in March 2019.  He is s/p  EUS for staging purposes on 09/29/2017 which resulted in T3N0 staging.     Current therapy: Radiation and weekly carboplatin/paclitaxel.  Week 1 of chemotherapy to start on 10/28/2017.  Today is his week 5 of therapy.  Interim History: [name] presents today for a follow-up visit.  He continues to receive radiation concomitantly with chemotherapy without any complications.  He denies any nausea, vomiting or infusion related complications.  He denies any worsening neuropathy or excessive fatigue.  He does report dysphagia and dyspepsia associated with his therapy which affected his oral intake.  And of lost close to 10 pounds in the last week or so.  He is able to take liquid diet including nutritional supplements as well as soft foods.  He denies any dizziness or lightheadedness.  He denies any falls or syncope.  He does not report any headaches, blurry vision, syncope or seizures.  He denies any alteration of mental status.  Does not report any fevers, chills or sweats.   Does not report any cough, wheezing or hemoptysis.  Does not report any chest pain, palpitation, orthopnea or leg edema.  Does not report any early satiety or abdominal pain.  Does not report any constipation or diarrhea.  Does not report any arthralgias or myalgias.   Does not report frequency, urgency or hematuria.  Denies any incontinence.  Does not report any skin rashes or lesions.  Does not report any lymphadenopathy or petechiae.  Does not report any anxiety or depression.  Remaining review of systems is negative.     Medications: I have reviewed the patient's current medications.  Current Outpatient Medications  Medication Sig Dispense Refill  . acetaminophen (TYLENOL) 325 MG tablet Take 2 tablets (650 mg total) by mouth every 6 (six) hours as needed for mild pain or headache.    [name] aspirin EC 81 MG EC tablet Take 1 tablet (81 mg total) by mouth daily.    [name] atorvastatin (LIPITOR) 40 MG tablet Take 40 mg by mouth daily at 6 PM.   3  . candesartan (ATACAND) 16 MG tablet Take 1 tablet (16 mg total) by mouth daily. 90 tablet 3  . clopidogrel (PLAVIX) 75 MG tablet Take 1 tablet (75 mg total) by mouth daily. Take for 6 months. Plan to stop on August 27th. Check with cardiologist at that time. 30 tablet 5  . diltiazem (CARDIZEM CD) 120 MG 24 hr capsule Take 1 capsule (120 mg total) by mouth daily. 30 capsule 11  . ferrous sulfate 325 (65 FE) MG tablet Take 325 mg by mouth daily.  3  . furosemide (LASIX) 20 MG tablet Take 2 tablets (40mg  total) by mouth Monday, Wednesday and Friday. Take 1 tablet by mouth daily on Tuesday, Thursday, Saturday and Sunday. 270 tablet 3  . gabapentin (NEURONTIN) 300 MG capsule Take 300 mg by mouth 2 (two) times daily.  1  . LANTUS SOLOSTAR 100 UNIT/ML Solostar Pen Inject 50 Units into the skin daily.   3  . nitroGLYCERIN (NITROSTAT) 0.4 MG SL tablet Place 1 tablet (0.4 mg total) under the tongue every 5 (  five) minutes as needed for chest pain (Angina). (Patient not taking: Reported on 11/11/2017) 25 tablet 3  . pantoprazole (PROTONIX) 40 MG tablet Take 1 tablet (40 mg total) by mouth daily. 30 tablet 11  . prochlorperazine (COMPAZINE) 10 MG tablet Take 1 tablet (10 mg total) by mouth every 6 (six) hours as needed for nausea or vomiting. 30 tablet 1  . sitaGLIPtin (JANUVIA) 100 MG tablet Take 100 mg by mouth daily.    . sucralfate (CARAFATE) 1 g tablet Take 1 tablet (1 g total) by mouth 4 (four) times daily -  with meals and at bedtime. 5 min before meals for radiation induced  esophagitis 120 tablet 2  . sucralfate (CARAFATE) 1 GM/10ML suspension Take 10 mLs (1 g total) by mouth 4 (four) times daily -  with meals and at bedtime. 420 mL 0   No current facility-administered medications for this visit.      Allergies: No Known Allergies  Past Medical History, Surgical history, Social history, and Family History were reviewed and updated.    Physical Exam: Blood pressure (!) 152/75, pulse 86, temperature 98.1 F (36.7 C), temperature source Oral, resp. rate 17, height 6' (1.829 m), weight 203 lb 1.6 oz (92.1 kg), SpO2 98 %.    ECOG: 1 General appearance: Alert, awake gentleman without distress. Head: Normal cephalic without abnormalities. Oropharynx: No oral thrush or ulcers. Eyes: Sclera anicteric.  Extraocular muscle intact. Lymph nodes: No cervical, supraclavicular, inguinal or axillary lymphadenopathy. Heart: Regular rate without any murmurs or gallops.  No edema. Lung: clear all lung fields without any rhonchi, wheezes or dullness to percussion.   Abdomin: Soft, nontender with good bowel sounds.  No rebound or guarding.  No shifting dullness or ascites. Neurological: No motor or sensory deficits. Skin: No skin rashes or lesions.  Skin appears dry and intact. Musculoskeletal: No clubbing or cyanosis.     Lab Results: Lab Results  Component Value Date   WBC 1.7 (L) 11/25/2017   HGB 12.7 (L) 11/25/2017   HCT 40.4 11/25/2017   MCV 73.2 (L) 11/25/2017   PLT 101 (L) 11/25/2017     Chemistry      Component Value Date/Time   NA 137 11/18/2017 0744   NA 142 09/13/2017 1233   K 3.4 (L) 11/18/2017 0744   CL 103 11/18/2017 0744   CO2 24 11/18/2017 0744   BUN 10 11/18/2017 0744   BUN 12 09/13/2017 1233   CREATININE 0.92 11/18/2017 0744      Component Value Date/Time   CALCIUM 8.3 (L) 11/18/2017 0744   ALKPHOS 61 11/18/2017 0744   AST 16 11/18/2017 0744   ALT 11 11/18/2017 0744   BILITOT 0.8 11/18/2017 0744       Impression and  Plan:  67 year old man with:  1.  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  He presented with GI bleeding and found to have a T3N0 by endoscopic ultrasound criteria.  He is receiving therapy with radiation as well as weekly chemotherapy utilizing carboplatin and paclitaxel.  Today is week 5 of possible 6 weeks of treatment.  He continues to tolerate therapy reasonably with complications including dysphagia and weight loss.  Risks and benefits of continuing therapy as well as the plan of care was reviewed today.  Is agreeable to continue to complete 6 weeks of therapy.  Upon completing this phase of treatment, he will have a restaging work-up including imaging studies to ensure stability of his disease.  If he continues  to have localized disease only, consideration for primary surgical therapy for curative intent will be undertaken.    2.  IV access: IV access remains via his peripheral veins without any issues.  3.  Antiemetics: Nausea or vomiting reported.  4.  Iron deficiency anemia: His hemoglobin continues to be close to normal without any issues.  5.  Dysphasia and weight loss: We have discussed strategies to improve his nutritional intake including continue with boost and Ensure.  His dysphasia is related to treatment which will and in the near future.  6.  Prognosis: Treatment remained curative in nature at this time.  His performance status is adequate and aggressive therapy is warranted.  7.  Follow-up: We will be in 1 week for his last cycle of therapy and in 4 weeks after that to follow his progress.  25  minutes was spent with the patient face-to-face today.  More than 50% of time was dedicated to patient counseling, education and coordinating his future plan of care.  Zola Button, MD 6/13/20198:29 AM

## 2017-11-26 ENCOUNTER — Telehealth: Payer: Self-pay

## 2017-11-26 ENCOUNTER — Ambulatory Visit
Admission: RE | Admit: 2017-11-26 | Discharge: 2017-11-26 | Disposition: A | Discharge status: Home / Self Care | Payer: Medicare HMO | Source: Ambulatory Visit | Attending: Radiation Oncology | Admitting: Radiation Oncology

## 2017-11-26 DIAGNOSIS — C155 Malignant neoplasm of lower third of esophagus: Secondary | ICD-10-CM | POA: Diagnosis not present

## 2017-11-26 DIAGNOSIS — Z510 Encounter for antineoplastic radiation therapy: Secondary | ICD-10-CM | POA: Diagnosis not present

## 2017-11-26 DIAGNOSIS — R112 Nausea with vomiting, unspecified: Secondary | ICD-10-CM | POA: Diagnosis not present

## 2017-11-26 DIAGNOSIS — C160 Malignant neoplasm of cardia: Secondary | ICD-10-CM | POA: Diagnosis not present

## 2017-11-26 DIAGNOSIS — D509 Iron deficiency anemia, unspecified: Secondary | ICD-10-CM | POA: Diagnosis not present

## 2017-11-26 MED ORDER — HYDROCODONE-ACETAMINOPHEN 7.5-325 MG/15ML PO SOLN: 10.0000 mL | Freq: Four times a day (QID) | ORAL | 0 refills | Status: DC | PRN

## 2017-11-26 NOTE — Telephone Encounter (Signed)
Spoke with patient concerning upcoming appointments added for July. Er 6/13 los, also mailed a calender with a letter enclosed to patient with dates, and time

## 2017-11-29 ENCOUNTER — Ambulatory Visit
Admission: RE | Admit: 2017-11-29 | Discharge: 2017-11-29 | Disposition: A | Discharge status: Home / Self Care | Payer: Medicare HMO | Source: Ambulatory Visit | Attending: Radiation Oncology | Admitting: Radiation Oncology

## 2017-11-29 DIAGNOSIS — R112 Nausea with vomiting, unspecified: Secondary | ICD-10-CM | POA: Diagnosis not present

## 2017-11-29 DIAGNOSIS — C155 Malignant neoplasm of lower third of esophagus: Secondary | ICD-10-CM | POA: Diagnosis not present

## 2017-11-29 DIAGNOSIS — D509 Iron deficiency anemia, unspecified: Secondary | ICD-10-CM | POA: Diagnosis not present

## 2017-11-29 DIAGNOSIS — Z510 Encounter for antineoplastic radiation therapy: Secondary | ICD-10-CM | POA: Diagnosis not present

## 2017-11-29 DIAGNOSIS — C160 Malignant neoplasm of cardia: Secondary | ICD-10-CM | POA: Diagnosis not present

## 2017-11-30 ENCOUNTER — Ambulatory Visit
Admission: RE | Admit: 2017-11-30 | Discharge: 2017-11-30 | Disposition: A | Discharge status: Home / Self Care | Payer: Medicare HMO | Source: Ambulatory Visit | Attending: Radiation Oncology | Admitting: Radiation Oncology

## 2017-11-30 DIAGNOSIS — R112 Nausea with vomiting, unspecified: Secondary | ICD-10-CM | POA: Diagnosis not present

## 2017-11-30 DIAGNOSIS — Z510 Encounter for antineoplastic radiation therapy: Secondary | ICD-10-CM | POA: Diagnosis not present

## 2017-11-30 DIAGNOSIS — D509 Iron deficiency anemia, unspecified: Secondary | ICD-10-CM | POA: Diagnosis not present

## 2017-11-30 DIAGNOSIS — C155 Malignant neoplasm of lower third of esophagus: Secondary | ICD-10-CM | POA: Diagnosis not present

## 2017-11-30 DIAGNOSIS — C160 Malignant neoplasm of cardia: Secondary | ICD-10-CM | POA: Diagnosis not present

## 2017-12-01 ENCOUNTER — Ambulatory Visit
Admission: RE | Admit: 2017-12-01 | Discharge: 2017-12-01 | Disposition: A | Discharge status: Home / Self Care | Payer: Medicare HMO | Source: Ambulatory Visit | Attending: Radiation Oncology | Admitting: Radiation Oncology

## 2017-12-01 DIAGNOSIS — D509 Iron deficiency anemia, unspecified: Secondary | ICD-10-CM | POA: Diagnosis not present

## 2017-12-01 DIAGNOSIS — C155 Malignant neoplasm of lower third of esophagus: Secondary | ICD-10-CM | POA: Diagnosis not present

## 2017-12-01 DIAGNOSIS — Z510 Encounter for antineoplastic radiation therapy: Secondary | ICD-10-CM | POA: Diagnosis not present

## 2017-12-01 DIAGNOSIS — R112 Nausea with vomiting, unspecified: Secondary | ICD-10-CM | POA: Diagnosis not present

## 2017-12-01 DIAGNOSIS — C160 Malignant neoplasm of cardia: Secondary | ICD-10-CM | POA: Diagnosis not present

## 2017-12-02 ENCOUNTER — Ambulatory Visit (HOSPITAL_COMMUNITY): Payer: Medicare HMO

## 2017-12-02 ENCOUNTER — Inpatient Hospital Stay: Payer: Medicare HMO

## 2017-12-02 ENCOUNTER — Encounter: Payer: Self-pay | Admitting: Radiation Oncology

## 2017-12-02 ENCOUNTER — Ambulatory Visit
Admission: RE | Admit: 2017-12-02 | Discharge: 2017-12-02 | Disposition: A | Discharge status: Home / Self Care | Payer: Medicare HMO | Source: Ambulatory Visit | Attending: Radiation Oncology | Admitting: Radiation Oncology

## 2017-12-02 ENCOUNTER — Telehealth: Payer: Self-pay

## 2017-12-02 VITALS — BP 152/79 | HR 93 | Temp 98.7°F | Resp 16

## 2017-12-02 DIAGNOSIS — Z510 Encounter for antineoplastic radiation therapy: Secondary | ICD-10-CM | POA: Diagnosis not present

## 2017-12-02 DIAGNOSIS — Z79899 Other long term (current) drug therapy: Secondary | ICD-10-CM | POA: Diagnosis not present

## 2017-12-02 DIAGNOSIS — C155 Malignant neoplasm of lower third of esophagus: Secondary | ICD-10-CM

## 2017-12-02 DIAGNOSIS — D509 Iron deficiency anemia, unspecified: Secondary | ICD-10-CM | POA: Diagnosis not present

## 2017-12-02 DIAGNOSIS — R634 Abnormal weight loss: Secondary | ICD-10-CM | POA: Diagnosis not present

## 2017-12-02 DIAGNOSIS — Z5111 Encounter for antineoplastic chemotherapy: Secondary | ICD-10-CM | POA: Diagnosis not present

## 2017-12-02 DIAGNOSIS — Z794 Long term (current) use of insulin: Secondary | ICD-10-CM | POA: Diagnosis not present

## 2017-12-02 DIAGNOSIS — Z7982 Long term (current) use of aspirin: Secondary | ICD-10-CM | POA: Diagnosis not present

## 2017-12-02 DIAGNOSIS — Z923 Personal history of irradiation: Secondary | ICD-10-CM | POA: Diagnosis not present

## 2017-12-02 DIAGNOSIS — R112 Nausea with vomiting, unspecified: Secondary | ICD-10-CM | POA: Diagnosis not present

## 2017-12-02 DIAGNOSIS — C160 Malignant neoplasm of cardia: Secondary | ICD-10-CM | POA: Diagnosis not present

## 2017-12-02 DIAGNOSIS — R1310 Dysphagia, unspecified: Secondary | ICD-10-CM | POA: Diagnosis not present

## 2017-12-02 LAB — CBC WITH DIFFERENTIAL (CANCER CENTER ONLY)
BASOS ABS: 0.1 10*3/uL (ref 0.0–0.1)
Basophils Relative: 3 %
Eosinophils Absolute: 0 10*3/uL (ref 0.0–0.5)
Eosinophils Relative: 0 %
HEMATOCRIT: 42.8 % (ref 38.4–49.9)
Hemoglobin: 13.7 g/dL (ref 13.0–17.1)
LYMPHS ABS: 0.2 10*3/uL — AB (ref 0.9–3.3)
LYMPHS PCT: 12 %
MCH: 23.2 pg — AB (ref 27.2–33.4)
MCHC: 32 g/dL (ref 32.0–36.0)
MCV: 72.4 fL — AB (ref 79.3–98.0)
Monocytes Absolute: 0.4 10*3/uL (ref 0.1–0.9)
Monocytes Relative: 26 %
NEUTROS ABS: 0.9 10*3/uL — AB (ref 1.5–6.5)
Neutrophils Relative %: 59 %
Platelet Count: 110 10*3/uL — ABNORMAL LOW (ref 140–400)
RBC: 5.91 MIL/uL — AB (ref 4.20–5.82)
RDW: 18.8 % — ABNORMAL HIGH (ref 11.0–14.6)
WBC: 1.5 10*3/uL — AB (ref 4.0–10.3)

## 2017-12-02 LAB — CMP (CANCER CENTER ONLY)
ALBUMIN: 3.1 g/dL — AB (ref 3.5–5.0)
ALT: 11 U/L (ref 0–55)
AST: 17 U/L (ref 5–34)
Alkaline Phosphatase: 62 U/L (ref 40–150)
Anion gap: 12 — ABNORMAL HIGH (ref 3–11)
BUN: 27 mg/dL — AB (ref 7–26)
CHLORIDE: 107 mmol/L (ref 98–109)
CO2: 26 mmol/L (ref 22–29)
Calcium: 9.5 mg/dL (ref 8.4–10.4)
Creatinine: 1.01 mg/dL (ref 0.70–1.30)
GFR, Est AFR Am: >60 mL/min (ref >60)
Glucose, Bld: 208 mg/dL — ABNORMAL HIGH (ref 70–140)
POTASSIUM: 3 mmol/L — AB (ref 3.5–5.1)
SODIUM: 145 mmol/L (ref 136–145)
Total Bilirubin: 0.8 mg/dL (ref 0.2–1.2)
Total Protein: 6.5 g/dL (ref 6.4–8.3)

## 2017-12-02 MED ORDER — SODIUM CHLORIDE 0.9 % IV SOLN
Freq: Once | INTRAVENOUS | Status: AC
  Administered 2017-12-02: 10:00:00 via INTRAVENOUS
  Filled 2017-12-02: qty 1000

## 2017-12-02 NOTE — Telephone Encounter (Signed)
Critical potassium lab of 3.0 received from lab tech. Dr. Alen Blew made aware.

## 2017-12-02 NOTE — Patient Instructions (Signed)

## 2017-12-02 NOTE — Progress Notes (Signed)
Per Dr. Alen Blew hold treatment of Taxol and Carboplatin today d/t ANC of 0.9.  Orders given for K+ 20 mEq.

## 2017-12-03 ENCOUNTER — Telehealth: Payer: Self-pay | Admitting: *Deleted

## 2017-12-03 ENCOUNTER — Other Ambulatory Visit: Payer: Self-pay | Admitting: Radiation Oncology

## 2017-12-03 MED ORDER — HYDROCODONE-ACETAMINOPHEN 7.5-325 MG/15ML PO SOLN: 10.0000 mL | Freq: Four times a day (QID) | ORAL | 0 refills | Status: DC | PRN

## 2017-12-03 NOTE — Telephone Encounter (Signed)
Mardene Celeste called requesting a medication refill for [facility].  Dr. Lisbeth Renshaw made aware and will send the prescription in to their [place].  I informed Mrs. Hamza that the prescription would be sent.  Will continue to follow as necessary.  Gloriajean Dell. Leonie Green, BSN

## 2017-12-06 ENCOUNTER — Emergency Department (HOSPITAL_COMMUNITY): Payer: Medicare HMO

## 2017-12-06 ENCOUNTER — Other Ambulatory Visit (HOSPITAL_COMMUNITY): Payer: Self-pay

## 2017-12-06 ENCOUNTER — Telehealth: Payer: Self-pay | Admitting: Interventional Cardiology

## 2017-12-06 ENCOUNTER — Other Ambulatory Visit: Payer: Self-pay

## 2017-12-06 ENCOUNTER — Inpatient Hospital Stay (HOSPITAL_COMMUNITY)
Admission: EM | Admit: 2017-12-06 | Discharge: 2017-12-10 | DRG: 309 | Disposition: A | Discharge status: Home / Self Care | Payer: Medicare HMO | Attending: Internal Medicine | Admitting: Internal Medicine

## 2017-12-06 ENCOUNTER — Telehealth: Payer: Self-pay | Admitting: *Deleted

## 2017-12-06 ENCOUNTER — Encounter (HOSPITAL_COMMUNITY): Payer: Self-pay | Admitting: Emergency Medicine

## 2017-12-06 DIAGNOSIS — R0602 Shortness of breath: Secondary | ICD-10-CM | POA: Diagnosis not present

## 2017-12-06 DIAGNOSIS — C155 Malignant neoplasm of lower third of esophagus: Secondary | ICD-10-CM | POA: Diagnosis present

## 2017-12-06 DIAGNOSIS — R1319 Other dysphagia: Secondary | ICD-10-CM | POA: Diagnosis present

## 2017-12-06 DIAGNOSIS — I951 Orthostatic hypotension: Secondary | ICD-10-CM | POA: Diagnosis present

## 2017-12-06 DIAGNOSIS — N179 Acute kidney failure, unspecified: Secondary | ICD-10-CM | POA: Diagnosis present

## 2017-12-06 DIAGNOSIS — Z79899 Other long term (current) drug therapy: Secondary | ICD-10-CM

## 2017-12-06 DIAGNOSIS — E860 Dehydration: Secondary | ICD-10-CM | POA: Diagnosis present

## 2017-12-06 DIAGNOSIS — I10 Essential (primary) hypertension: Secondary | ICD-10-CM | POA: Diagnosis not present

## 2017-12-06 DIAGNOSIS — R42 Dizziness and giddiness: Secondary | ICD-10-CM

## 2017-12-06 DIAGNOSIS — Z951 Presence of aortocoronary bypass graft: Secondary | ICD-10-CM | POA: Diagnosis not present

## 2017-12-06 DIAGNOSIS — I252 Old myocardial infarction: Secondary | ICD-10-CM | POA: Diagnosis not present

## 2017-12-06 DIAGNOSIS — Z95828 Presence of other vascular implants and grafts: Secondary | ICD-10-CM

## 2017-12-06 DIAGNOSIS — I4891 Unspecified atrial fibrillation: Secondary | ICD-10-CM

## 2017-12-06 DIAGNOSIS — Y842 Radiological procedure and radiotherapy as the cause of abnormal reaction of the patient, or of later complication, without mention of misadventure at the time of the procedure: Secondary | ICD-10-CM | POA: Diagnosis present

## 2017-12-06 DIAGNOSIS — Z955 Presence of coronary angioplasty implant and graft: Secondary | ICD-10-CM

## 2017-12-06 DIAGNOSIS — I82422 Acute embolism and thrombosis of left iliac vein: Secondary | ICD-10-CM | POA: Diagnosis present

## 2017-12-06 DIAGNOSIS — Z7902 Long term (current) use of antithrombotics/antiplatelets: Secondary | ICD-10-CM

## 2017-12-06 DIAGNOSIS — E7800 Pure hypercholesterolemia, unspecified: Secondary | ICD-10-CM | POA: Diagnosis present

## 2017-12-06 DIAGNOSIS — M7989 Other specified soft tissue disorders: Secondary | ICD-10-CM

## 2017-12-06 DIAGNOSIS — K219 Gastro-esophageal reflux disease without esophagitis: Secondary | ICD-10-CM | POA: Diagnosis present

## 2017-12-06 DIAGNOSIS — R000 Tachycardia, unspecified: Secondary | ICD-10-CM

## 2017-12-06 DIAGNOSIS — E1165 Type 2 diabetes mellitus with hyperglycemia: Secondary | ICD-10-CM | POA: Diagnosis present

## 2017-12-06 DIAGNOSIS — Z7982 Long term (current) use of aspirin: Secondary | ICD-10-CM

## 2017-12-06 DIAGNOSIS — Z833 Family history of diabetes mellitus: Secondary | ICD-10-CM

## 2017-12-06 DIAGNOSIS — E1151 Type 2 diabetes mellitus with diabetic peripheral angiopathy without gangrene: Secondary | ICD-10-CM | POA: Diagnosis present

## 2017-12-06 DIAGNOSIS — I2510 Atherosclerotic heart disease of native coronary artery without angina pectoris: Secondary | ICD-10-CM | POA: Diagnosis present

## 2017-12-06 DIAGNOSIS — R55 Syncope and collapse: Secondary | ICD-10-CM

## 2017-12-06 DIAGNOSIS — I824Y2 Acute embolism and thrombosis of unspecified deep veins of left proximal lower extremity: Secondary | ICD-10-CM | POA: Diagnosis not present

## 2017-12-06 DIAGNOSIS — I4819 Other persistent atrial fibrillation: Secondary | ICD-10-CM | POA: Diagnosis present

## 2017-12-06 DIAGNOSIS — R609 Edema, unspecified: Secondary | ICD-10-CM | POA: Diagnosis not present

## 2017-12-06 DIAGNOSIS — I5042 Chronic combined systolic (congestive) and diastolic (congestive) heart failure: Secondary | ICD-10-CM | POA: Diagnosis present

## 2017-12-06 DIAGNOSIS — I82432 Acute embolism and thrombosis of left popliteal vein: Secondary | ICD-10-CM | POA: Diagnosis present

## 2017-12-06 DIAGNOSIS — I82412 Acute embolism and thrombosis of left femoral vein: Secondary | ICD-10-CM | POA: Diagnosis not present

## 2017-12-06 DIAGNOSIS — I110 Hypertensive heart disease with heart failure: Secondary | ICD-10-CM | POA: Diagnosis present

## 2017-12-06 DIAGNOSIS — R269 Unspecified abnormalities of gait and mobility: Secondary | ICD-10-CM | POA: Diagnosis not present

## 2017-12-06 DIAGNOSIS — Z8249 Family history of ischemic heart disease and other diseases of the circulatory system: Secondary | ICD-10-CM

## 2017-12-06 DIAGNOSIS — E870 Hyperosmolality and hypernatremia: Secondary | ICD-10-CM | POA: Diagnosis not present

## 2017-12-06 DIAGNOSIS — D61818 Other pancytopenia: Secondary | ICD-10-CM | POA: Diagnosis present

## 2017-12-06 DIAGNOSIS — I480 Paroxysmal atrial fibrillation: Secondary | ICD-10-CM | POA: Diagnosis not present

## 2017-12-06 DIAGNOSIS — E876 Hypokalemia: Secondary | ICD-10-CM | POA: Diagnosis not present

## 2017-12-06 DIAGNOSIS — I82409 Acute embolism and thrombosis of unspecified deep veins of unspecified lower extremity: Secondary | ICD-10-CM

## 2017-12-06 DIAGNOSIS — E11649 Type 2 diabetes mellitus with hypoglycemia without coma: Secondary | ICD-10-CM | POA: Diagnosis not present

## 2017-12-06 DIAGNOSIS — T66XXXA Radiation sickness, unspecified, initial encounter: Secondary | ICD-10-CM | POA: Diagnosis present

## 2017-12-06 DIAGNOSIS — I25709 Atherosclerosis of coronary artery bypass graft(s), unspecified, with unspecified angina pectoris: Secondary | ICD-10-CM | POA: Diagnosis not present

## 2017-12-06 DIAGNOSIS — I25810 Atherosclerosis of coronary artery bypass graft(s) without angina pectoris: Secondary | ICD-10-CM | POA: Diagnosis present

## 2017-12-06 DIAGNOSIS — D509 Iron deficiency anemia, unspecified: Secondary | ICD-10-CM | POA: Diagnosis present

## 2017-12-06 DIAGNOSIS — Z794 Long term (current) use of insulin: Secondary | ICD-10-CM

## 2017-12-06 DIAGNOSIS — E08649 Diabetes mellitus due to underlying condition with hypoglycemia without coma: Secondary | ICD-10-CM

## 2017-12-06 DIAGNOSIS — I959 Hypotension, unspecified: Secondary | ICD-10-CM | POA: Diagnosis not present

## 2017-12-06 DIAGNOSIS — I481 Persistent atrial fibrillation: Secondary | ICD-10-CM | POA: Diagnosis not present

## 2017-12-06 DIAGNOSIS — R2242 Localized swelling, mass and lump, left lower limb: Secondary | ICD-10-CM | POA: Diagnosis not present

## 2017-12-06 DIAGNOSIS — R946 Abnormal results of thyroid function studies: Secondary | ICD-10-CM | POA: Diagnosis not present

## 2017-12-06 DIAGNOSIS — Z87891 Personal history of nicotine dependence: Secondary | ICD-10-CM

## 2017-12-06 DIAGNOSIS — E119 Type 2 diabetes mellitus without complications: Secondary | ICD-10-CM

## 2017-12-06 LAB — BASIC METABOLIC PANEL
Anion gap: 17 — ABNORMAL HIGH (ref 5–15)
BUN: 57 mg/dL — AB (ref 6–20)
CO2: 23 mmol/L (ref 22–32)
CREATININE: 2.65 mg/dL — AB (ref 0.61–1.24)
Calcium: 8.9 mg/dL (ref 8.9–10.3)
Chloride: 105 mmol/L (ref 101–111)
GFR calc Af Amer: 27 mL/min — ABNORMAL LOW (ref >60)
GFR, EST NON AFRICAN AMERICAN: 23 mL/min — AB (ref >60)
GLUCOSE: 394 mg/dL — AB (ref 65–99)
POTASSIUM: 3.4 mmol/L — AB (ref 3.5–5.1)
SODIUM: 145 mmol/L (ref 135–145)

## 2017-12-06 LAB — CBC WITH DIFFERENTIAL/PLATELET
BASOS ABS: 0 10*3/uL (ref 0.0–0.1)
BASOS PCT: 1 %
EOS ABS: 0 10*3/uL (ref 0.0–0.7)
Eosinophils Relative: 0 %
HCT: 47.4 % (ref 39.0–52.0)
Hemoglobin: 14 g/dL (ref 13.0–17.0)
LYMPHS ABS: 0.7 10*3/uL (ref 0.7–4.0)
Lymphocytes Relative: 19 %
MCH: 22.1 pg — AB (ref 26.0–34.0)
MCHC: 29.5 g/dL — AB (ref 30.0–36.0)
MCV: 74.8 fL — AB (ref 78.0–100.0)
Monocytes Absolute: 0.3 10*3/uL (ref 0.1–1.0)
Monocytes Relative: 8 %
NEUTROS ABS: 2.8 10*3/uL (ref 1.7–7.7)
Neutrophils Relative %: 72 %
PLATELETS: UNDETERMINED 10*3/uL (ref 150–400)
RBC: 6.34 MIL/uL — ABNORMAL HIGH (ref 4.22–5.81)
RDW: 20.6 % — ABNORMAL HIGH (ref 11.5–15.5)
WBC: 3.8 10*3/uL — ABNORMAL LOW (ref 4.0–10.5)

## 2017-12-06 LAB — BRAIN NATRIURETIC PEPTIDE: B Natriuretic Peptide: 121.7 pg/mL — ABNORMAL HIGH (ref 0.0–100.0)

## 2017-12-06 LAB — I-STAT CHEM 8, ED
BUN: 53 mg/dL — AB (ref 6–20)
CREATININE: 2.5 mg/dL — AB (ref 0.61–1.24)
Calcium, Ion: 1.16 mmol/L (ref 1.15–1.40)
Chloride: 108 mmol/L (ref 101–111)
Glucose, Bld: 398 mg/dL — ABNORMAL HIGH (ref 65–99)
HEMATOCRIT: 44 % (ref 39.0–52.0)
HEMOGLOBIN: 15 g/dL (ref 13.0–17.0)
Potassium: 3.5 mmol/L (ref 3.5–5.1)
Sodium: 145 mmol/L (ref 135–145)
TCO2: 23 mmol/L (ref 22–32)

## 2017-12-06 LAB — TSH: TSH: 0.126 u[IU]/mL — AB (ref 0.350–4.500)

## 2017-12-06 LAB — I-STAT TROPONIN, ED: TROPONIN I, POC: 0.01 ng/mL (ref 0.00–0.08)

## 2017-12-06 LAB — MAGNESIUM: MAGNESIUM: 2 mg/dL (ref 1.7–2.4)

## 2017-12-06 LAB — GLUCOSE, CAPILLARY: GLUCOSE-CAPILLARY: 272 mg/dL — AB (ref 65–99)

## 2017-12-06 MED ORDER — SODIUM CHLORIDE 0.9 % IV BOLUS
1000.0000 mL | Freq: Once | INTRAVENOUS | Status: AC
Start: 2017-12-06 — End: 2017-12-06
  Administered 2017-12-06: 1000 mL via INTRAVENOUS

## 2017-12-06 MED ORDER — DILTIAZEM HCL-DEXTROSE 100-5 MG/100ML-% IV SOLN (PREMIX)
5.0000 mg/h | Freq: Once | INTRAVENOUS | Status: AC
  Administered 2017-12-06: 5 mg/h via INTRAVENOUS
  Filled 2017-12-06: qty 100

## 2017-12-06 MED ORDER — FUROSEMIDE 40 MG PO TABS: 40.0000 mg | ORAL_TABLET | Freq: Every day | ORAL | Status: DC

## 2017-12-06 MED ORDER — ASPIRIN EC 81 MG PO TBEC
81.0000 mg | DELAYED_RELEASE_TABLET | Freq: Every day | ORAL | Status: DC
  Administered 2017-12-07 – 2017-12-09 (×3): 81 mg via ORAL
  Filled 2017-12-06 (×3): qty 1

## 2017-12-06 MED ORDER — ATORVASTATIN CALCIUM 40 MG PO TABS
40.0000 mg | ORAL_TABLET | Freq: Every day | ORAL | Status: DC
  Administered 2017-12-07 – 2017-12-09 (×3): 40 mg via ORAL
  Filled 2017-12-06 (×3): qty 1

## 2017-12-06 MED ORDER — NITROGLYCERIN 0.4 MG SL SUBL: 0.4000 mg | SUBLINGUAL_TABLET | SUBLINGUAL | Status: DC | PRN

## 2017-12-06 MED ORDER — PROCHLORPERAZINE MALEATE 10 MG PO TABS
10.0000 mg | ORAL_TABLET | Freq: Four times a day (QID) | ORAL | Status: DC | PRN
  Filled 2017-12-06: qty 1

## 2017-12-06 MED ORDER — PANTOPRAZOLE SODIUM 40 MG PO TBEC
40.0000 mg | DELAYED_RELEASE_TABLET | Freq: Every day | ORAL | Status: DC
  Administered 2017-12-07 – 2017-12-10 (×4): 40 mg via ORAL
  Filled 2017-12-06 (×4): qty 1

## 2017-12-06 MED ORDER — INSULIN GLARGINE 100 UNIT/ML ~~LOC~~ SOLN
50.0000 [IU] | Freq: Every day | SUBCUTANEOUS | Status: DC
  Administered 2017-12-07: 50 [IU] via SUBCUTANEOUS
  Filled 2017-12-06: qty 0.5

## 2017-12-06 MED ORDER — GABAPENTIN 300 MG PO CAPS
300.0000 mg | ORAL_CAPSULE | Freq: Two times a day (BID) | ORAL | Status: DC
  Administered 2017-12-07 – 2017-12-10 (×7): 300 mg via ORAL
  Filled 2017-12-06 (×7): qty 1

## 2017-12-06 MED ORDER — INSULIN ASPART 100 UNIT/ML ~~LOC~~ SOLN
0.0000 [IU] | Freq: Three times a day (TID) | SUBCUTANEOUS | Status: DC
  Administered 2017-12-07 – 2017-12-08 (×3): 3 [IU] via SUBCUTANEOUS
  Administered 2017-12-08: 2 [IU] via SUBCUTANEOUS
  Administered 2017-12-09: 3 [IU] via SUBCUTANEOUS

## 2017-12-06 MED ORDER — METOPROLOL TARTRATE 5 MG/5ML IV SOLN
5.0000 mg | Freq: Four times a day (QID) | INTRAVENOUS | Status: DC | PRN
  Administered 2017-12-07: 5 mg via INTRAVENOUS
  Filled 2017-12-06: qty 5

## 2017-12-06 MED ORDER — IRBESARTAN 150 MG PO TABS
300.0000 mg | ORAL_TABLET | Freq: Every day | ORAL | Status: DC
  Administered 2017-12-07: 300 mg via ORAL
  Filled 2017-12-06: qty 2

## 2017-12-06 MED ORDER — INSULIN ASPART 100 UNIT/ML ~~LOC~~ SOLN
4.0000 [IU] | Freq: Once | SUBCUTANEOUS | Status: AC
  Administered 2017-12-06: 4 [IU] via SUBCUTANEOUS

## 2017-12-06 MED ORDER — HEPARIN BOLUS VIA INFUSION
5000.0000 [IU] | Freq: Once | INTRAVENOUS | Status: AC
  Administered 2017-12-06: 5000 [IU] via INTRAVENOUS
  Filled 2017-12-06: qty 5000

## 2017-12-06 MED ORDER — SODIUM CHLORIDE 0.9 % IV BOLUS
1000.0000 mL | Freq: Once | INTRAVENOUS | Status: AC
  Administered 2017-12-06: 1000 mL via INTRAVENOUS

## 2017-12-06 MED ORDER — ONDANSETRON HCL 4 MG/2ML IJ SOLN
4.0000 mg | Freq: Four times a day (QID) | INTRAMUSCULAR | Status: DC | PRN
Start: 2017-12-06 — End: 2017-12-10

## 2017-12-06 MED ORDER — SUCRALFATE 1 G PO TABS
1.0000 g | ORAL_TABLET | Freq: Three times a day (TID) | ORAL | Status: DC
  Administered 2017-12-07 – 2017-12-10 (×14): 1 g via ORAL
  Filled 2017-12-06 (×14): qty 1

## 2017-12-06 MED ORDER — ACETAMINOPHEN 325 MG PO TABS: 650.0000 mg | ORAL_TABLET | Freq: Four times a day (QID) | ORAL | Status: DC | PRN

## 2017-12-06 MED ORDER — HEPARIN (PORCINE) IN NACL 100-0.45 UNIT/ML-% IJ SOLN
1500.0000 [IU]/h | INTRAMUSCULAR | Status: DC
  Administered 2017-12-06: 1500 [IU]/h via INTRAVENOUS
  Filled 2017-12-06 (×2): qty 250

## 2017-12-06 MED ORDER — FERROUS SULFATE 325 (65 FE) MG PO TABS
325.0000 mg | ORAL_TABLET | Freq: Every day | ORAL | Status: DC
  Administered 2017-12-07 – 2017-12-10 (×4): 325 mg via ORAL
  Filled 2017-12-06 (×4): qty 1

## 2017-12-06 MED ORDER — DILTIAZEM HCL 25 MG/5ML IV SOLN
10.0000 mg | Freq: Once | INTRAVENOUS | Status: AC
Start: 2017-12-06 — End: 2017-12-06
  Administered 2017-12-06: 10 mg via INTRAVENOUS
  Filled 2017-12-06: qty 5

## 2017-12-06 MED ORDER — DILTIAZEM HCL ER COATED BEADS 120 MG PO CP24
120.0000 mg | ORAL_CAPSULE | Freq: Every day | ORAL | Status: DC
  Administered 2017-12-07: 120 mg via ORAL
  Filled 2017-12-06: qty 1

## 2017-12-06 MED ORDER — METOPROLOL TARTRATE 5 MG/5ML IV SOLN
INTRAVENOUS | Status: AC
  Administered 2017-12-06: 22:00:00
  Filled 2017-12-06: qty 5

## 2017-12-06 MED ORDER — ACETAMINOPHEN 325 MG PO TABS: 650.0000 mg | ORAL_TABLET | ORAL | Status: DC | PRN

## 2017-12-06 MED ORDER — LINAGLIPTIN 5 MG PO TABS
5.0000 mg | ORAL_TABLET | Freq: Every day | ORAL | Status: DC
  Administered 2017-12-07 – 2017-12-09 (×3): 5 mg via ORAL
  Filled 2017-12-06 (×3): qty 1

## 2017-12-06 MED ORDER — CLOPIDOGREL BISULFATE 75 MG PO TABS
75.0000 mg | ORAL_TABLET | Freq: Every day | ORAL | Status: DC
  Administered 2017-12-07 – 2017-12-10 (×4): 75 mg via ORAL
  Filled 2017-12-06 (×4): qty 1

## 2017-12-06 NOTE — Progress Notes (Signed)
ANTICOAGULATION CONSULT NOTE - Initial Consult  Pharmacy Consult for heparin Indication: r/o PE  No Known Allergies  Patient Measurements: Height: 6' (182.9 cm) Weight: 201 lb 15.1 oz (91.6 kg) IBW/kg (Calculated) : 77.6 Heparin Dosing Weight: 91.6kg  Vital Signs: Temp: 96.8 F (36 C) (06/24 1727) Temp Source: Temporal (06/24 1727) BP: 123/70 (06/24 1830) Pulse Rate: 53 (06/24 1830)  Labs: Recent Labs    12/06/17 1731 12/06/17 1756  HGB 14.0 15.0  HCT 47.4 44.0  PLT PENDING  --   CREATININE 2.65* 2.50*    Estimated Creatinine Clearance: 30.6 mL/min (A) (by C-G formula based on SCr of 2.5 mg/dL (H)).   Medical History: Past Medical History:  Diagnosis Date  . Angina   . Arthritis    "hands" (08/10/2017)  . Atrial flutter, paroxysmal ([place])    arrived with aflutter with RVR, EF down to 25-30%, s/p TEE DCCV on 01/29/2015  . Chronic lower back pain    "all the time from my sciatic nerve"  . Coronary artery disease   . Erectile dysfunction   . GERD (gastroesophageal reflux disease)   . Heart failure ([place])   . Hypercholesteremia   . Hypertension   . 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 Myocardial infarction ([place]) 1988; ~2355;  ~1996;~ 2000  . Obesity   . Peripheral vascular disease ([place])   . Umbilical hernia    unrepaired    Medications:  Infusions:  . heparin    . sodium chloride    . sodium chloride      Assessment: 61 yom presented to the ED with tachycardia s/p syncopal event. Hgb is WNL and platelets are currently pending. Had been on the lower end recently. No AC PTA. No bleeding noted.   Goal of Therapy:  Heparin level 0.3-0.7 units/ml Monitor platelets by anticoagulation protocol: Yes   Plan:  Heparin bolus 5000 units IV x 1 Heparin gtt 1500 units/hr Check an 8 hr heparin level Daily heparin level and CBC F/u CT and pending platelets  Carin Shipp, Rande Lawman 12/06/2017,6:49 PM

## 2017-12-06 NOTE — Telephone Encounter (Signed)
Received call from pt's wife stating that she just got home & husband is not doing well.  She reports that his blood sugar has been up =450 & his L leg is purple,swollen, & foot is red but he doesn't want to go to ED.  She is holding on another line to talk with his cardiology office & reports that his dr is not there today.  Discussed checking for pulses & also rechecking his blood sugar but should go to ED.  She states she is going to call PCP also.  Suggested that they would probably want him to go to ED also & encouraged to go or call 911.  Reported to Dr Philippa Sicks

## 2017-12-06 NOTE — H&P (Signed)
History and Physical    Justin Blackburn. FFM:[id number] DOB: Jan 10, 1951 DOA: 12/06/2017  Referring MD/NP/PA: Dr. Tyrone Nine  PCP: [facility]   Outpatient Specialists: Dr Zola Button   Patient coming from: Home  Chief Complaint: Almost passed out  HPI: Justin Frazer. is a 67 y.o. male with medical history significant of coronary artery disease, atrial fibrillation, history of esophageal cancer, hypertension with dysphagia following radiation therapy who is not on any anticoagulation at home but felt too weak lightheaded and almost passed out.  He was feeling palpitation.  Wife called 38 and patient was found to have narrow complex tachycardia which was later discovered to be A. fib with RVR in the ER.  Patient has been started on IV Cardizem.  Was given treatment in the ER but no response.  His rate was as high as 200.  Patient has bilateral lower extremity edema.  He has prior history of heart disease with some diastolic dysfunction CHF.  Patient has no history of PE or DVT.  ED Course: Patient with evaluated with a heart rate of 170 and atrial fibrillation.  Given IV Cardizem as well as placed on a drip.  Blood pressure is stable at 146/85.  White count is 3.8, BUN is 53 and creatinine 2.5.  Glucose is 398.  Chest x-ray showed no evidence of fluid overload.  Review of Systems: As per HPI otherwise 10 point review of systems negative.    Past Medical History:  Diagnosis Date  . Angina   . Arthritis    "hands" (08/10/2017)  . Atrial flutter, paroxysmal ([place])    arrived with aflutter with RVR, EF down to 25-30%, s/p TEE DCCV on 01/29/2015  . Chronic lower back pain    "all the time from my sciatic nerve"  . Coronary artery disease   . Erectile dysfunction   . GERD (gastroesophageal reflux disease)   . Heart failure ([place])   . Hypercholesteremia   . Hypertension   . 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 Myocardial infarction ([place]) 1988;  ~5701;  ~1996;~ 2000  . Obesity   . Peripheral vascular disease ([place])   . Umbilical hernia    unrepaired    Past Surgical History:  Procedure Laterality Date  . CARDIOVERSION N/A 01/29/2015   Procedure: CARDIOVERSION;  Surgeon: [name], MD;  Location: [place];  Service: Cardiovascular;  Laterality: N/A;  . CORONARY ANGIOPLASTY WITH STENT PLACEMENT  08/31/11   "2 today; makes total of 3 stents"  . CORONARY ANGIOPLASTY WITH STENT PLACEMENT  08/10/2017   "2 today; makes a total of 5 stents" (08/10/2017)  . CORONARY ARTERY BYPASS GRAFT  1998; 2007   CABG X 4; CABG X4  . CORONARY STENT INTERVENTION N/A 08/10/2017   Procedure: CORONARY STENT INTERVENTION;  Surgeon: Sherren Mocha, MD;  Location: [place] [facility];  Service: Cardiovascular;  Laterality: N/A;  . ESOPHAGOGASTRODUODENOSCOPY (EGD) WITH PROPOFOL N/A 09/01/2017   Procedure: ESOPHAGOGASTRODUODENOSCOPY (EGD) WITH PROPOFOL;  Surgeon: Laurence Spates, MD;  Location: [place];  Service: Endoscopy;  Laterality: N/A;  . EUS N/A 09/29/2017   Procedure: UPPER ENDOSCOPIC ULTRASOUND (EUS) LINEAR;  Surgeon: Arta Silence, MD;  Location: [facility];  Service: Endoscopy;  Laterality: N/A;  . LEFT HEART CATH AND CORS/GRAFTS ANGIOGRAPHY N/A 08/10/2017   Procedure: LEFT HEART CATH AND CORS/GRAFTS ANGIOGRAPHY;  Surgeon: Sherren Mocha, MD;  Location: [place] [facility];  Service: Cardiovascular;  Laterality: N/A;  . LEFT HEART CATHETERIZATION WITH  CORONARY/GRAFT ANGIOGRAM N/A 08/31/2011   Procedure: LEFT HEART CATHETERIZATION WITH Beatrix Fetters;  Surgeon: [name], MD;  Location: [facility] C[facility];  Service: Cardiovascular;  Laterality: N/A;  . PERIPHERAL VASCULAR CATHETERIZATION N/A 04/27/2016   Procedure: Abdominal Aortogram w/Lower Extremity;  Surgeon: Angelia Mould, MD;  Location: [place] [facility];  Service: Cardiovascular;  Laterality: N/A;  . TEE WITHOUT CARDIOVERSION N/A 01/29/2015   Procedure:  TRANSESOPHAGEAL ECHOCARDIOGRAM (TEE);  Surgeon: [name], MD;  Location: [facility] ENDOSCOPY;  Service: Cardiovascular;  Laterality: N/A;     reports that he quit smoking about 46 years ago. His smoking use included cigarettes. He has a 12.00 pack-year smoking history. He has never used smokeless tobacco. He reports that he does not drink alcohol or use drugs.  No Known Allergies  Family History  Problem Relation Age of Onset  . Heart disease Mother   . Heart attack Mother   . Diabetes Mother   . Heart disease Father   . Heart attack Father   . Diabetes Father   . Heart attack Brother   . Stroke Neg Hx     Prior to Admission medications   Medication Sig Start Date End Date Taking? Authorizing Provider  acetaminophen (TYLENOL) 325 MG tablet Take 2 tablets (650 mg total) by mouth every 6 (six) hours as needed for mild pain or headache. 09/04/17   Erlene Quan, PA-C  aspirin EC 81 MG EC tablet Take 1 tablet (81 mg total) by mouth daily. 09/04/17   Erlene Quan, PA-C  atorvastatin (LIPITOR) 40 MG tablet Take 40 mg by mouth daily at 6 PM.  07/14/17   [provider]  candesartan (ATACAND) 16 MG tablet Take 1 tablet (16 mg total) by mouth daily. 09/13/17   Richardson Dopp T, PA-C  carvedilol (COREG) 12.5 MG tablet TAKE 1 TABLET (12.5 MG TOTAL) BY MOUTH 2 (TWO) TIMES DAILY WITH A MEAL. 11/25/17   Consuelo Pandy, PA-C  clopidogrel (PLAVIX) 75 MG tablet Take 1 tablet (75 mg total) by mouth daily. Take for 6 months. Plan to stop on August 27th. Check with cardiologist at that time. 08/11/17   Daune Perch, NP  diltiazem (CARDIZEM CD) 120 MG 24 hr capsule Take 1 capsule (120 mg total) by mouth daily. 09/05/17   Erlene Quan, PA-C  ferrous sulfate 325 (65 FE) MG tablet Take 325 mg by mouth daily. 07/20/17   [provider]  furosemide (LASIX) 20 MG tablet Take 2 tablets (40mg  total) by mouth Monday, Wednesday and Friday. Take 1 tablet by mouth daily on Tuesday, Thursday,  Saturday and Sunday. 10/19/17   Belva Crome, MD  gabapentin (NEURONTIN) 300 MG capsule Take 300 mg by mouth 2 (two) times daily. 07/09/17   [provider]  HYDROcodone-acetaminophen (HYCET) 7.5-325 mg/15 ml solution Take 10 mLs by mouth 4 (four) times daily as needed for moderate pain. 12/03/17   Kyung Rudd, MD  LANTUS SOLOSTAR 100 UNIT/ML Solostar Pen Inject 50 Units into the skin daily.  12/07/14   [provider]  nitroGLYCERIN (NITROSTAT) 0.4 MG SL tablet Place 1 tablet (0.4 mg total) under the tongue every 5 (five) minutes as needed for chest pain (Angina). Patient not taking: Reported on 11/25/2017 06/18/14 08/09/18  Belva Crome, MD  pantoprazole (PROTONIX) 40 MG tablet Take 1 tablet (40 mg total) by mouth daily. 09/05/17   Erlene Quan, PA-C  prochlorperazine (COMPAZINE) 10 MG tablet Take 1 tablet (10 mg total) by  mouth every 6 (six) hours as needed for nausea or vomiting. 10/14/17   Wyatt Portela, MD  sitaGLIPtin (JANUVIA) 100 MG tablet Take 100 mg by mouth daily.    [provider]  sucralfate (CARAFATE) 1 g tablet Take 1 tablet (1 g total) by mouth 4 (four) times daily -  with meals and at bedtime. 5 min before meals for radiation induced esophagitis 11/22/17   Hayden Pedro, [place]    Physical Exam: Vitals:   12/06/17 1745 12/06/17 1800 12/06/17 1815 12/06/17 1830  BP: (!) 113/52 118/77 120/80 123/70  Pulse: (!) 38 (!) 46 (!) 52 (!) 53  Resp: (!) 23 (!) 23  19  Temp:      TempSrc:      SpO2: 99% 100% 99% 100%  Weight:  91.6 kg (201 lb 15.1 oz)    Height:  6' (1.829 m)        Constitutional: NAD, calm, comfortable Vitals:   12/06/17 1745 12/06/17 1800 12/06/17 1815 12/06/17 1830  BP: (!) 113/52 118/77 120/80 123/70  Pulse: (!) 38 (!) 46 (!) 52 (!) 53  Resp: (!) 23 (!) 23  19  Temp:      TempSrc:      SpO2: 99% 100% 99% 100%  Weight:  91.6 kg (201 lb 15.1 oz)    Height:  6' (1.829 m)     Eyes: PERRL, lids and conjunctivae normal ENMT:  Mucous membranes are moist. Posterior pharynx clear of any exudate or lesions.Normal dentition.  Neck: normal, supple, no masses, no thyromegaly Respiratory: clear to auscultation bilaterally, no wheezing, no crackles. Normal respiratory effort. No accessory muscle use.  Cardiovascular:Irregularly Irregular rhythm and tachycardia, no murmurs / rubs / gallops. No extremity edema. 2+ pedal pulses. No carotid bruits.  Abdomen: no tenderness, no masses palpated. No hepatosplenomegaly. Bowel sounds positive.  Musculoskeletal: no clubbing / cyanosis. No joint deformity upper and lower extremities. Good ROM, no contractures. Normal muscle tone.  Skin: no rashes, lesions, ulcers. No induration Neurologic: CN 2-12 grossly intact. Sensation intact, DTR normal. Strength 5/5 in all 4.  Psychiatric: Normal judgment and insight. Alert and oriented x 3. Normal mood.    Labs on Admission: I have personally reviewed following labs and imaging studies  CBC: Recent Labs  Lab 12/02/17 0733 12/06/17 1731 12/06/17 1756  WBC 1.5* 3.8*  --   NEUTROABS 0.9* PENDING  --   HGB 13.7 14.0 15.0  HCT 42.8 47.4 44.0  MCV 72.4* 74.8*  --   PLT 110* PENDING  --    Basic Metabolic Panel: Recent Labs  Lab 12/02/17 0733 12/06/17 1731 12/06/17 1756  NA 145 145 145  K 3.0* 3.4* 3.5  CL 107 105 108  CO2 26 23  --   GLUCOSE 208* 394* 398*  BUN 27* 57* 53*  CREATININE 1.01 2.65* 2.50*  CALCIUM 9.5 8.9  --    GFR: Estimated Creatinine Clearance: 30.6 mL/min (A) (by C-G formula based on SCr of 2.5 mg/dL (H)). Liver Function Tests: Recent Labs  Lab 12/02/17 0733  AST 17  ALT 11  ALKPHOS 62  BILITOT 0.8  PROT 6.5  ALBUMIN 3.1*   No results for input(s): LIPASE, AMYLASE in the last 168 hours. No results for input(s): AMMONIA in the last 168 hours. Coagulation Profile: No results for input(s): INR, PROTIME in the last 168 hours. Cardiac Enzymes: No results for input(s): CKTOTAL, CKMB, CKMBINDEX, TROPONINI  in the last 168 hours. BNP (last 3 results) No results for  input(s): PROBNP in the last 8760 hours. HbA1C: No results for input(s): HGBA1C in the last 72 hours. CBG: No results for input(s): GLUCAP in the last 168 hours. Lipid Profile: No results for input(s): CHOL, HDL, LDLCALC, TRIG, CHOLHDL, LDLDIRECT in the last 72 hours. Thyroid Function Tests: No results for input(s): TSH, T4TOTAL, FREET4, T3FREE, THYROIDAB in the last 72 hours. Anemia Panel: No results for input(s): VITAMINB12, FOLATE, FERRITIN, TIBC, IRON, RETICCTPCT in the last 72 hours. Urine analysis: No results found for: COLORURINE, APPEARANCEUR, LABSPEC, PHURINE, GLUCOSEU, HGBUR, BILIRUBINUR, KETONESUR, PROTEINUR, UROBILINOGEN, NITRITE, LEUKOCYTESUR Sepsis Labs: @LABRCNTIP (procalcitonin:4,lacticidven:4) )No results found for this or any previous visit (from the past 240 hour(s)).   Radiological Exams on Admission: Dg Chest Port 1 View  Result Date: 12/06/2017 CLINICAL DATA:  Tachycardia EXAM: PORTABLE CHEST 1 VIEW COMPARISON:  08/30/2017 FINDINGS: Stable cardiomegaly with aortic atherosclerosis. Post CABG change noted with median sternotomy sutures in place. Lungs are free of pulmonary consolidations. No pneumothorax or pleural effusion. Pulmonary edema. No acute osseous abnormality. IMPRESSION: 1. Stable cardiomegaly with post CABG change. 2. Aortic atherosclerosis. 3. No active pulmonary disease. Electronically Signed   By: Ashley Royalty M.D.   On: 12/06/2017 17:42    EKG: Independently reviewed.  EKG showed atrial fibrillation with a rate of 130.  Assessment/Plan Principal Problem:   A-fib (with RVR) HCC) Active Problems:   Hypertension   Diabetes mellitus (HCC)   Chronic combined systolic and diastolic heart failure, NYHA class 2 (HCC)   Coronary artery disease involving coronary bypass graft of native heart with angina pectoris (HCC)   Postural dizziness with presyncope   #1 atrial fibrillation with rapid  ventricular response: Patient currently on Cardizem drip with occasional Lopressor.  He appears to be dry.  Patient will need IV fluids among other things.  If rate is not controlled will consider amiodarone or possibly even digoxin when he is fully hydrated.  I will DC Lasix.  Cardiology has been consulted.  #2 diabetes: Blood sugar is elevated.  Resume home regimen and add sliding scale insulin.  Patient is not eating adequately.  #3 dysphagia: Secondary to esophageal cancer.  Patient undergoing radiation therapy.  I will change his diet to pured diet.  #4 hypertension: Patient is on Cardizem drip with his home regimen.  We will continue treatment as per home dose oral medications.  #5 history of estrogen cancer: Continue according to his oncologist.  #6 presyncope: Most likely secondary to atrial fibrillation with rapid ventricular response.  Again monitor on telemetry  #7 coronary artery disease: Monitor cardiac enzymes.  Patient appears to be stable at this point.   DVT prophylaxis: Heparin drip Code Status: Full code Family Communication: Wife who is at bedside Disposition Plan: To be determined Consults called: Cardiology Admission status: Inpatient Severity of Illness: The appropriate patient status for this patient is INPATIENT. Inpatient status is judged to be reasonable and necessary in order to provide the required intensity of service to ensure the patient's safety. The patient's presenting symptoms, physical exam findings, and initial radiographic and laboratory data in the context of their chronic comorbidities is felt to place them at high risk for further clinical deterioration. Furthermore, it is not anticipated that the patient will be medically stable for discharge from the hospital within 2 midnights of admission. The following factors support the patient status of inpatient.   " The patient's presenting symptoms include presyncope and palpitation. " The worrisome  physical exam findings include irregularly irregular rhythm. " The initial  radiographic and laboratory data are worrisome because of EKG showing A. fib with RVR. " The chronic co-morbidities include coronary artery disease with history of atrial fibrillation.   * I certify that at the point of admission it is my clinical judgment that the patient will require inpatient hospital care spanning beyond 2 midnights from the point of admission due to high intensity of service, high risk for further deterioration and high frequency of surveillance required.Barbette Merino MD [facility] Pager [phone]  If 7PM-7AM, please contact night-coverage [email or web address] Password [facility]  12/06/2017, 6:56 PM

## 2017-12-06 NOTE — Consult Note (Signed)
Cardiology Consultation Note    Patient ID: Justin Brinkmeier., MRN: [id number], DOB/AGE: 1950/11/04 67 y.o. Admit date: 12/06/2017   Date of Consult: 12/06/2017 Primary Physician: Practice, [facility] Primary Cardiologist: Dr. Tamala Julian  Reason for Consultation: AF with RVR Requesting MD: Dr. Jonelle Sidle  HPI: Justin Coopman. is a 67 y.o. male with a history of coronary artery disease status post remote CABG and multiple PCIs, chronic systolic and diastolic heart failure (most recent EF 45 to 50%), PAF not on anticoagulation due to GI bleeding, esophageal cancer undergoing radiation and awaiting esophagectomy, who presents with shortness of breath and palpitations.  The patient was going to an outpatient appointment to investigate isolated left lower extremity swelling, when he noted the relatively abrupt onset of shortness of breath and palpitations.  When EMS picked him up he has noted to have a narrow complex tachycardia with a rate of 190, which subsequently converted into atrial fibrillation with rates of 110s to 170s.  He then presented to the [facility] ED for further evaluation.  In the ED, his initial blood pressures were 90s over 40s with a heart rate in the 110s in AF.  Labs were notable for an AKI, with a creatinine of 2.7 (up from 1.0 2 days prior to admission).  ECG showed atrial fibrillation with an IVCD.  Received 3 L of IV fluid bolus with improvement in his blood pressure.  He was started on a diltiazem drip and admitted to the hospitalist service for further management.  Upon my interview, patient appeared to have converted to normal sinus rhythm with a heart rate in the 70s on telemetry.  He was asymptomatic at the time.  Past Medical History:  Diagnosis Date  . Angina   . Arthritis    "hands" (08/10/2017)  . Atrial flutter, paroxysmal ([place])    arrived with aflutter with RVR, EF down to 25-30%, s/p TEE DCCV on 01/29/2015  . Chronic lower back pain    "all the time from my  sciatic nerve"  . Coronary artery disease   . Erectile dysfunction   . GERD (gastroesophageal reflux disease)   . Heart failure ([place])   . Hypercholesteremia   . Hypertension   . 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 Myocardial infarction ([place]) 1988; ~1962;  ~1996;~ 2000  . Obesity   . Peripheral vascular disease ([place])   . Umbilical hernia    unrepaired      Surgical History:  Past Surgical History:  Procedure Laterality Date  . CARDIOVERSION N/A 01/29/2015   Procedure: CARDIOVERSION;  Surgeon: [name], MD;  Location: [place];  Service: Cardiovascular;  Laterality: N/A;  . CORONARY ANGIOPLASTY WITH STENT PLACEMENT  08/31/11   "2 today; makes total of 3 stents"  . CORONARY ANGIOPLASTY WITH STENT PLACEMENT  08/10/2017   "2 today; makes a total of 5 stents" (08/10/2017)  . CORONARY ARTERY BYPASS GRAFT  1998; 2007   CABG X 4; CABG X4  . CORONARY STENT INTERVENTION N/A 08/10/2017   Procedure: CORONARY STENT INTERVENTION;  Surgeon: Sherren Mocha, MD;  Location: [place] [facility];  Service: Cardiovascular;  Laterality: N/A;  . ESOPHAGOGASTRODUODENOSCOPY (EGD) WITH PROPOFOL N/A 09/01/2017   Procedure: ESOPHAGOGASTRODUODENOSCOPY (EGD) WITH PROPOFOL;  Surgeon: Laurence Spates, MD;  Location: [place];  Service: Endoscopy;  Laterality: N/A;  . EUS N/A 09/29/2017   Procedure: UPPER ENDOSCOPIC ULTRASOUND (EUS) LINEAR;  Surgeon: Arta Silence, MD;  Location: [facility];  Service: Endoscopy;  Laterality: N/A;  . LEFT HEART CATH AND CORS/GRAFTS ANGIOGRAPHY N/A 08/10/2017   Procedure: LEFT HEART CATH AND CORS/GRAFTS ANGIOGRAPHY;  Surgeon: Sherren Mocha, MD;  Location: [place] [facility];  Service: Cardiovascular;  Laterality: N/A;  . LEFT HEART CATHETERIZATION WITH CORONARY/GRAFT ANGIOGRAM N/A 08/31/2011   Procedure: LEFT HEART CATHETERIZATION WITH Beatrix Fetters;  Surgeon: [name], MD;  Location: [facility];  Service: Cardiovascular;   Laterality: N/A;  . PERIPHERAL VASCULAR CATHETERIZATION N/A 04/27/2016   Procedure: Abdominal Aortogram w/Lower Extremity;  Surgeon: Angelia Mould, MD;  Location: [place] [facility];  Service: Cardiovascular;  Laterality: N/A;  . TEE WITHOUT CARDIOVERSION N/A 01/29/2015   Procedure: TRANSESOPHAGEAL ECHOCARDIOGRAM (TEE);  Surgeon: [name], MD;  Location: [facility] ENDOSCOPY;  Service: Cardiovascular;  Laterality: N/A;     Home Meds: Prior to Admission medications   Medication Sig Start Date End Date Taking? Authorizing Provider  acetaminophen (TYLENOL) 325 MG tablet Take 2 tablets (650 mg total) by mouth every 6 (six) hours as needed for mild pain or headache. 09/04/17  Yes Kilroy, Doreene Burke, PA-C  aspirin EC 81 MG EC tablet Take 1 tablet (81 mg total) by mouth daily. 09/04/17  Yes Kilroy, Luke K, PA-C  atorvastatin (LIPITOR) 40 MG tablet Take 40 mg by mouth daily at 6 PM.  07/14/17  Yes [provider]  candesartan (ATACAND) 16 MG tablet Take 1 tablet (16 mg total) by mouth daily. 09/13/17  Yes Richardson Dopp T, PA-C  clopidogrel (PLAVIX) 75 MG tablet Take 1 tablet (75 mg total) by mouth daily. Take for 6 months. Plan to stop on August 27th. Check with cardiologist at that time. 08/11/17  Yes Daune Perch, NP  diltiazem (CARDIZEM CD) 120 MG 24 hr capsule Take 1 capsule (120 mg total) by mouth daily. 09/05/17  Yes Kilroy, Luke K, PA-C  ferrous sulfate 325 (65 FE) MG tablet Take 325 mg by mouth daily. 07/20/17  Yes [provider]  furosemide (LASIX) 20 MG tablet Take 2 tablets (40mg  total) by mouth Monday, Wednesday and Friday. Take 1 tablet by mouth daily on Tuesday, Thursday, Saturday and Sunday. 10/19/17  Yes Belva Crome, MD  gabapentin (NEURONTIN) 300 MG capsule Take 300 mg by mouth 2 (two) times daily. 07/09/17  Yes [provider]  LANTUS SOLOSTAR 100 UNIT/ML Solostar Pen Inject 50 Units into the skin daily.  12/07/14  Yes [provider]  nitroGLYCERIN  (NITROSTAT) 0.4 MG SL tablet Place 1 tablet (0.4 mg total) under the tongue every 5 (five) minutes as needed for chest pain ([place]). 06/18/14 08/09/18 Yes Belva Crome, MD  pantoprazole (PROTONIX) 40 MG tablet Take 1 tablet (40 mg total) by mouth daily. 09/05/17  Yes Kilroy, Luke K, PA-C  prochlorperazine (COMPAZINE) 10 MG tablet Take 1 tablet (10 mg total) by mouth every 6 (six) hours as needed for nausea or vomiting. 10/14/17  Yes Wyatt Portela, MD  sitaGLIPtin (JANUVIA) 100 MG tablet Take 100 mg by mouth daily.   Yes [provider]  sucralfate (CARAFATE) 1 g tablet Take 1 tablet (1 g total) by mouth 4 (four) times daily -  with meals and at bedtime. 5 min before meals for radiation induced esophagitis 11/22/17  Yes Hayden Pedro, PA-C  carvedilol (COREG) 12.5 MG tablet TAKE 1 TABLET (12.5 MG TOTAL) BY MOUTH 2 (TWO) TIMES DAILY WITH A MEAL. Patient not taking: Reported on 12/06/2017 11/25/17   Consuelo Pandy, PA-C  HYDROcodone-acetaminophen (HYCET) 7.5-325 mg/15  ml solution Take 10 mLs by mouth 4 (four) times daily as needed for moderate pain. Patient not taking: Reported on 12/06/2017 12/03/17   Kyung Rudd, MD    Inpatient Medications:  . [START ON 12/07/2017] aspirin EC  81 mg Oral Daily  . [START ON 12/07/2017] atorvastatin  40 mg Oral q1800  . [START ON 12/07/2017] clopidogrel  75 mg Oral Daily  . [START ON 12/07/2017] diltiazem  120 mg Oral Daily  . diltiazem  10 mg Intravenous Once  . [START ON 12/07/2017] ferrous sulfate  325 mg Oral Daily  . gabapentin  300 mg Oral BID  . [START ON 12/07/2017] insulin aspart  0-15 Units Subcutaneous TID WC  . [START ON 12/07/2017] insulin glargine  50 Units Subcutaneous Daily  . [START ON 12/07/2017] irbesartan  300 mg Oral Daily  . [START ON 12/07/2017] linagliptin  5 mg Oral Daily  . metoprolol tartrate      . [START ON 12/07/2017] pantoprazole  40 mg Oral Daily  . sucralfate  1 g Oral TID WC & HS   . heparin 1,500 Units/hr (12/06/17  1931)    Allergies: No Known Allergies  Social History   Socioeconomic History  . Marital status: Married    Spouse name: Not on file  . Number of children: Not on file  . Years of education: Not on file  . Highest education level: Not on file  Occupational History  . Not on file  Social Needs  . Financial resource strain: Not on file  . Food insecurity:    Worry: Not on file    Inability: Not on file  . Transportation needs:    Medical: Not on file    Non-medical: Not on file  Tobacco Use  . Smoking status: Former Smoker    Packs/day: 2.00    Years: 6.00    Pack years: 12.00    Types: Cigarettes    Last attempt to quit: 06/16/1971    Years since quitting: 46.5  . Smokeless tobacco: Never Used  . Tobacco comment: "chewed on cigars; never really did chew tobacco"  Substance and Sexual Activity  . Alcohol use: No  . Drug use: No  . Sexual activity: Never  Lifestyle  . Physical activity:    Days per week: Not on file    Minutes per session: Not on file  . Stress: Not on file  Relationships  . Social connections:    Talks on phone: Not on file    Gets together: Not on file    Attends religious service: Not on file    Active member of club or organization: Not on file    Attends meetings of clubs or organizations: Not on file    Relationship status: Not on file  . Intimate partner violence:    Fear of current or ex partner: Not on file    Emotionally abused: Not on file    Physically abused: Not on file    Forced sexual activity: Not on file  Other Topics Concern  . Not on file  Social History Narrative  . Not on file     Family History  Problem Relation Age of Onset  . Heart disease Mother   . Heart attack Mother   . Diabetes Mother   . Heart disease Father   . Heart attack Father   . Diabetes Father   . Heart attack Brother   . Stroke Neg Hx      Review of Systems:  General: negative for chills, fever, night sweats or weight changes.    Cardiovascular: negative for chest pain, edema, orthopnea, palpitations, paroxysmal nocturnal dyspnea, shortness of breath or dyspnea on exertion Dermatological: negative for rash Respiratory: negative for cough or wheezing Urologic: negative for hematuria Abdominal: negative for nausea, vomiting, diarrhea, bright red blood per rectum, melena, or hematemesis Neurologic: negative for visual changes, syncope, or dizziness All other systems reviewed and are otherwise negative except as noted above.  Labs: No results for input(s): CKTOTAL, CKMB, TROPONINI in the last 72 hours. Lab Results  Component Value Date   WBC 3.8 (L) 12/06/2017   HGB 15.0 12/06/2017   HCT 44.0 12/06/2017   MCV 74.8 (L) 12/06/2017   PLT PLATELET CLUMPS NOTED ON SMEAR, UNABLE TO ESTIMATE 12/06/2017    Recent Labs  Lab 12/02/17 0733  12/06/17 1731 12/06/17 1756  NA 145  --  145 145  K 3.0*  --  3.4* 3.5  CL 107  --  105 108  CO2 26  --  23  --   BUN 27*  --  57* 53*  CREATININE 1.01   < > 2.65* 2.50*  CALCIUM 9.5  --  8.9  --   PROT 6.5  --   --   --   BILITOT 0.8  --   --   --   ALKPHOS 62  --   --   --   ALT 11  --   --   --   AST 17  --   --   --   GLUCOSE 208*  --  394* 398*   < > = values in this interval not displayed.   Lab Results  Component Value Date   CHOL 129 09/21/2017   HDL 31 (L) 09/21/2017   LDLCALC 70 09/21/2017   TRIG 141 09/21/2017   No results found for: DDIMER  Radiology/Studies:  Dg Chest Port 1 View  Result Date: 12/06/2017 CLINICAL DATA:  Tachycardia EXAM: PORTABLE CHEST 1 VIEW COMPARISON:  08/30/2017 FINDINGS: Stable cardiomegaly with aortic atherosclerosis. Post CABG change noted with median sternotomy sutures in place. Lungs are free of pulmonary consolidations. No pneumothorax or pleural effusion. Pulmonary edema. No acute osseous abnormality. IMPRESSION: 1. Stable cardiomegaly with post CABG change. 2. Aortic atherosclerosis. 3. No active pulmonary disease.  Electronically Signed   By: Ashley Royalty M.D.   On: 12/06/2017 17:42    Wt Readings from Last 3 Encounters:  12/06/17 83.2 kg (183 lb 6.8 oz)  11/25/17 91.6 kg (202 lb)  11/25/17 92.1 kg (203 lb 1.6 oz)    EKG: Atrial fibrillation with Ashman beat, IVCD, rate 131.  Physical Exam: Blood pressure (!) 141/4, pulse (!) 55, temperature 97.8 F (36.6 C), temperature source Oral, resp. rate (!) 1, height 6' (1.829 m), weight 83.2 kg (183 lb 6.8 oz), SpO2 98 %. Body mass index is 24.88 kg/m. General: Well developed, well nourished, in no acute distress. Head: Normocephalic, atraumatic, sclera non-icteric, no xanthomas, nares are without discharge.  Neck: Negative for carotid bruits. JVD not elevated. Lungs: Clear bilaterally to auscultation without wheezes, rales, or rhonchi. Breathing is unlabored. Heart: RRR with S1 S2. No murmurs, rubs, or gallops appreciated. Abdomen: Soft, non-tender, non-distended with normoactive bowel sounds. No hepatomegaly. No rebound/guarding. No obvious abdominal masses. Msk:  Strength and tone appear normal for age. Extremities: No clubbing or cyanosis. 2+ nonpitting edema of LLE.69   Distal pedal pulses are 2+ and equal bilaterally. Neuro: Alert and oriented X 3.  No facial asymmetry. No focal deficit. Moves all extremities spontaneously. Psych:  Responds to questions appropriately with a normal affect.     Assessment and Plan  53M with a history of coronary artery disease status post remote CABG and multiple PCIs, chronic systolic and diastolic heart failure (most recent EF 45 to 50%), PAF not on anticoagulation due to GI bleeding, esophageal cancer undergoing radiation and awaiting esophagectomy, who presents with shortness of breath and palpitations, found to have atrial fibrillation with RVR and concern for left lower extremity DVT/possible PE.  It seems the patient has converted back to normal sinus rhythm although somewhat difficult to tell on telemetry, would  confirm this with a 12 lead ECG.  Would plan to fractionate home diltiazem and uptitrate dosing to 60 mg every 6 hours.  Would give the first dose of oral diltiazem and then discontinue diltiazem drip 1 to 2 hours thereafter.  Agree with heparin for anticoagulation for now.  Given relatively recent PCI, will likely need Plavix and oral anticoagulant upon discharge.  The choice of oral anticoagulant will depend on his renal function and whether this normalizes with IV fluid resuscitation.  Would be reasonable to repeat an echo in the morning, primarily to look for signs of acute RV dysfunction with possible PE.  At this point, renal function includes a CT PE study.  Cardiology will follow along with you.  Signed, Doylene Canning, MD 12/06/2017, 11:16 PM

## 2017-12-06 NOTE — ED Triage Notes (Signed)
Pt presents with [facility] for syncopal episode in his kitchen Pt was ambulating through house with wife and was going to get in the car to come to the ER for his L leg being swollen and purple. While ambulating through the kitchen, pt had syncopal episode and wife called 911 Upon EMS arrival, EMS found HR in the 190s and was going to cardiovert him but he awakened, HR still between 130 - 190bpm Pt now alert and oriented, BP noted to be low

## 2017-12-06 NOTE — ED Provider Notes (Signed)
[facility] EMERGENCY DEPARTMENT Provider Note   CSN: [id number] Arrival date & time: 12/06/17  1703     History   Chief Complaint Chief Complaint  Patient presents with  . Tachycardia    HPI Justin Blackburn. is a 67 y.o. male.  67 yo M with a chief complaint of a near syncopal event.  The patient was walking to a doctor's appointment and suddenly felt very short of breath and that he was having palpitations.  911 was called and the patient was found to have a narrow complex tachycardia and a rate of 190, he converted spontaneously into an irregular rhythm with a rate somewhere between 110 and 170.  His symptoms have significantly improved.  He was going because he had had significant swelling to the left lower extremity.  This been going on for the past couple days.  Described his mildly painful.  Denies prior history of PE or DVT.  Has esophageal cancer undergoing radiation therapy.  The history is provided by the patient.  Illness  This is a new problem. The current episode started 2 days ago. The problem occurs constantly. The problem has been gradually worsening. Associated symptoms include shortness of breath. Pertinent negatives include no chest pain, no abdominal pain and no headaches. Nothing aggravates the symptoms. Nothing relieves the symptoms. He has tried nothing for the symptoms. The treatment provided no relief.    Past Medical History:  Diagnosis Date  . Angina   . Arthritis    "hands" (08/10/2017)  . Atrial flutter, paroxysmal ([place])    arrived with aflutter with RVR, EF down to 25-30%, s/p TEE DCCV on 01/29/2015  . Chronic lower back pain    "all the time from my sciatic nerve"  . Coronary artery disease   . Erectile dysfunction   . GERD (gastroesophageal reflux disease)   . Heart failure ([place])   . Hypercholesteremia   . Hypertension   . 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 Myocardial infarction ([place]) 1988; ~7209;   ~1996;~ 2000  . Obesity   . Peripheral vascular disease ([place])   . Umbilical hernia    unrepaired    Patient Active Problem List   Diagnosis Date Noted  . A-fib (with RVR) [place]) 12/06/2017  . Postural dizziness with presyncope 12/06/2017  . Encounter for antineoplastic chemotherapy 11/11/2017  . 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 Atrial flutter ([place]) 09/01/2017  . Elevated troponin   . Iron deficiency anemia   . Coronary artery disease involving coronary bypass graft of native heart with angina pectoris ([place]) 08/28/2016  . 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 Anticoagulated 02/06/2015  . Demand ischemia ([place]) 01/30/2015  . Chronic combined systolic and diastolic heart failure, NYHA class 2 ([place]) 04/17/2013  . Umbilical hernia   . Myocardial infarction ([place])   . Peripheral vascular disease ([place])   . Hypercholesteremia   . Hypertension 09/01/2011  . Diabetes mellitus ([place]) 09/01/2011    Past Surgical History:  Procedure Laterality Date  . CARDIOVERSION N/A 01/29/2015   Procedure: CARDIOVERSION;  Surgeon: [name], MD;  Location: [facility];  Service: Cardiovascular;  Laterality: N/A;  . CORONARY ANGIOPLASTY WITH STENT PLACEMENT  08/31/11   "2 today; makes total of 3 stents"  . CORONARY ANGIOPLASTY WITH STENT PLACEMENT  08/10/2017   "2 today; makes a total of 5 stents" (08/10/2017)  . CORONARY ARTERY BYPASS GRAFT  1998;  2007   CABG X 4; CABG X4  . CORONARY STENT INTERVENTION N/A 08/10/2017   Procedure: CORONARY STENT INTERVENTION;  Surgeon: Sherren Mocha, MD;  Location: [facility] LAB;  Service: Cardiovascular;  Laterality: N/A;  . ESOPHAGOGASTRODUODENOSCOPY (EGD) WITH PROPOFOL N/A 09/01/2017   Procedure: ESOPHAGOGASTRODUODENOSCOPY (EGD) WITH PROPOFOL;  Surgeon: Laurence Spates, MD;  Location: [facility];  Service: Endoscopy;  Laterality: N/A;  . EUS N/A 09/29/2017   Procedure: UPPER ENDOSCOPIC  ULTRASOUND (EUS) LINEAR;  Surgeon: Arta Silence, MD;  Location: [facility];  Service: Endoscopy;  Laterality: N/A;  . LEFT HEART CATH AND CORS/GRAFTS ANGIOGRAPHY N/A 08/10/2017   Procedure: LEFT HEART CATH AND CORS/GRAFTS ANGIOGRAPHY;  Surgeon: Sherren Mocha, MD;  Location: [facility];  Service: Cardiovascular;  Laterality: N/A;  . LEFT HEART CATHETERIZATION WITH CORONARY/GRAFT ANGIOGRAM N/A 08/31/2011   Procedure: LEFT HEART CATHETERIZATION WITH Beatrix Fetters;  Surgeon: [name], MD;  Location: [facility] CATH LAB;  Service: Cardiovascular;  Laterality: N/A;  . PERIPHERAL VASCULAR CATHETERIZATION N/A 04/27/2016   Procedure: Abdominal Aortogram w/Lower Extremity;  Surgeon: Angelia Mould, MD;  Location: [facility];  Service: Cardiovascular;  Laterality: N/A;  . TEE WITHOUT CARDIOVERSION N/A 01/29/2015   Procedure: TRANSESOPHAGEAL ECHOCARDIOGRAM (TEE);  Surgeon: [name], MD;  Location[facility];  Service: Cardiovascular;  Laterality: N/A;        Home Medications    Prior to Admission medications   Medication Sig Start Date End Date Taking? Authorizing Provider  acetaminophen (TYLENOL) 325 MG tablet Take 2 tablets (650 mg total) by mouth every 6 (six) hours as needed for mild pain or headache. 09/04/17  Yes Kilroy, Doreene Burke, PA-C  aspirin EC 81 MG EC tablet Take 1 tablet (81 mg total) by mouth daily. 09/04/17  Yes Kilroy, Luke K, PA-C  atorvastatin (LIPITOR) 40 MG tablet Take 40 mg by mouth daily at 6 PM.  07/14/17  Yes [provider]  candesartan (ATACAND) 16 MG tablet Take 1 tablet (16 mg total) by mouth daily. 09/13/17  Yes Richardson Dopp T, PA-C  clopidogrel (PLAVIX) 75 MG tablet Take 1 tablet (75 mg total) by mouth daily. Take for 6 months. Plan to stop on August 27th. Check with cardiologist at that time. 08/11/17  Yes Daune Perch, NP  diltiazem (CARDIZEM CD) 120 MG 24 hr capsule Take 1 capsule (120 mg total) by mouth daily. 09/05/17   Yes Kilroy, Luke K, PA-C  ferrous sulfate 325 (65 FE) MG tablet Take 325 mg by mouth daily. 07/20/17  Yes [provider]  furosemide (LASIX) 20 MG tablet Take 2 tablets (40mg  total) by mouth Monday, Wednesday and Friday. Take 1 tablet by mouth daily on Tuesday, Thursday, Saturday and Sunday. 10/19/17  Yes Belva Crome, MD  gabapentin (NEURONTIN) 300 MG capsule Take 300 mg by mouth 2 (two) times daily. 07/09/17  Yes [provider]  LANTUS SOLOSTAR 100 UNIT/ML Solostar Pen Inject 50 Units into the skin daily.  12/07/14  Yes [provider]  nitroGLYCERIN (NITROSTAT) 0.4 MG SL tablet Place 1 tablet (0.4 mg total) under the tongue every 5 (five) minutes as needed for chest pain ([place]). 06/18/14 08/09/18 Yes Belva Crome, MD  pantoprazole (PROTONIX) 40 MG tablet Take 1 tablet (40 mg total) by mouth daily. 09/05/17  Yes Kilroy, Luke K, PA-C  prochlorperazine (COMPAZINE) 10 MG tablet Take 1 tablet (10 mg total) by mouth every 6 (six) hours as needed for nausea or vomiting. 10/14/17  Yes Shadad,  Mathis Dad, MD  sitaGLIPtin (JANUVIA) 100 MG tablet Take 100 mg by mouth daily.   Yes [provider]  sucralfate (CARAFATE) 1 g tablet Take 1 tablet (1 g total) by mouth 4 (four) times daily -  with meals and at bedtime. 5 min before meals for radiation induced esophagitis 11/22/17  Yes Hayden Pedro, PA-C  carvedilol (COREG) 12.5 MG tablet TAKE 1 TABLET (12.5 MG TOTAL) BY MOUTH 2 (TWO) TIMES DAILY WITH A MEAL. Patient not taking: Reported on 12/06/2017 11/25/17   Consuelo Pandy, PA-C  HYDROcodone-acetaminophen (HYCET) 7.5-325 mg/15 ml solution Take 10 mLs by mouth 4 (four) times daily as needed for moderate pain. Patient not taking: Reported on 12/06/2017 12/03/17   Kyung Rudd, MD    Family History Family History  Problem Relation Age of Onset  . Heart disease Mother   . Heart attack Mother   . Diabetes Mother   . Heart disease Father   . Heart attack Father   .  Diabetes Father   . Heart attack Brother   . Stroke Neg Hx     Social History Social History   Tobacco Use  . Smoking status: Former Smoker    Packs/day: 2.00    Years: 6.00    Pack years: 12.00    Types: Cigarettes    Last attempt to quit: 06/16/1971    Years since quitting: 46.5  . Smokeless tobacco: Never Used  . Tobacco comment: "chewed on cigars; never really did chew tobacco"  Substance Use Topics  . Alcohol use: No  . Drug use: No     Allergies   Patient has no known allergies.   Review of Systems Review of Systems  Constitutional: Negative for chills and fever.  HENT: Negative for congestion and facial swelling.   Eyes: Negative for discharge and visual disturbance.  Respiratory: Positive for shortness of breath.   Cardiovascular: Positive for leg swelling. Negative for chest pain and palpitations.  Gastrointestinal: Negative for abdominal pain, diarrhea and vomiting.  Musculoskeletal: Negative for arthralgias and myalgias.  Skin: Negative for color change and rash.  Neurological: Negative for tremors, syncope and headaches.  Psychiatric/Behavioral: Negative for confusion and dysphoric mood.     Physical Exam Updated Vital Signs BP (!) 146/85   Pulse (!) 53   Temp (!) 96.8 F (36 C) (Temporal)   Resp 17   Ht 6' (1.829 m)   Wt 91.6 kg (201 lb 15.1 oz)   SpO2 100%   BMI 27.39 kg/m   Physical Exam  Constitutional: He is oriented to person, place, and time. He appears well-developed and well-nourished.  HENT:  Head: Normocephalic and atraumatic.  Eyes: Pupils are equal, round, and reactive to light. EOM are normal.  Neck: Normal range of motion. Neck supple. No JVD present.  Cardiovascular: Normal rate. An irregularly irregular rhythm present. Exam reveals no gallop and no friction rub.  No murmur heard. Pulmonary/Chest: No respiratory distress. He has no wheezes.  Abdominal: He exhibits no distension and no mass. There is no tenderness. There is no  rebound and no guarding.  Musculoskeletal: Normal range of motion.  Neurological: He is alert and oriented to person, place, and time.  Skin: No rash noted. No pallor.  Psychiatric: He has a normal mood and affect. His behavior is normal.  Nursing note and vitals reviewed.    ED Treatments / Results  Labs (all labs ordered are listed, but only abnormal results are displayed) Labs Reviewed  CBC WITH  DIFFERENTIAL/PLATELET - Abnormal; Notable for the following components:      Result Value   WBC 3.8 (*)    RBC 6.34 (*)    MCV 74.8 (*)    MCH 22.1 (*)    MCHC 29.5 (*)    RDW 20.6 (*)    All other components within normal limits  BASIC METABOLIC PANEL - Abnormal; Notable for the following components:   Potassium 3.4 (*)    Glucose, Bld 394 (*)    BUN 57 (*)    Creatinine, Ser 2.65 (*)    GFR calc non Af Amer 23 (*)    GFR calc Af Amer 27 (*)    Anion gap 17 (*)    All other components within normal limits  BRAIN NATRIURETIC PEPTIDE - Abnormal; Notable for the following components:   B Natriuretic Peptide 121.7 (*)    All other components within normal limits  I-STAT CHEM 8, ED - Abnormal; Notable for the following components:   BUN 53 (*)    Creatinine, Ser 2.50 (*)    Glucose, Bld 398 (*)    All other components within normal limits  HEPARIN LEVEL (UNFRACTIONATED)  CBC  I-STAT TROPONIN, ED    EKG None  Radiology Dg Chest Port 1 View  Result Date: 12/06/2017 CLINICAL DATA:  Tachycardia EXAM: PORTABLE CHEST 1 VIEW COMPARISON:  08/30/2017 FINDINGS: Stable cardiomegaly with aortic atherosclerosis. Post CABG change noted with median sternotomy sutures in place. Lungs are free of pulmonary consolidations. No pneumothorax or pleural effusion. Pulmonary edema. No acute osseous abnormality. IMPRESSION: 1. Stable cardiomegaly with post CABG change. 2. Aortic atherosclerosis. 3. No active pulmonary disease. Electronically Signed   By: Ashley Royalty M.D.   On: 12/06/2017 17:42     Procedures Procedures (including critical care time)  Medications Ordered in ED Medications  sodium chloride 0.9 % bolus 1,000 mL (1,000 mLs Intravenous New Bag/Given 12/06/17 1935)  heparin ADULT infusion 100 units/mL (25000 units/266mL sodium chloride 0.45%) (1,500 Units/hr Intravenous New Bag/Given 12/06/17 1931)  sodium chloride 0.9 % bolus 1,000 mL (has no administration in time range)  sodium chloride 0.9 % bolus 1,000 mL (0 mLs Intravenous Stopped 12/06/17 1934)  heparin bolus via infusion 5,000 Units (5,000 Units Intravenous Bolus from Bag 12/06/17 1934)  diltiazem (CARDIZEM) 100 mg in dextrose 5% 167mL (1 mg/mL) infusion (5 mg/hr Intravenous New Bag/Given 12/06/17 1900)     Initial Impression / Assessment and Plan / ED Course  I have reviewed the triage vital signs and the nursing notes.  Pertinent labs & imaging results that were available during my care of the patient were reviewed by me and considered in my medical decision making (see chart for details).     67 yo M with a chief complaint of a near syncopal event.  At this time he was noted to have a heart rate into the 190s.  Here in the emergency department he appears to be in A. fib with RVR.  His troponin is negative, however I am concerned for a pulmonary embolism as the patient has a very swollen left lower extremity.  I will start him on heparin.  I am unable to obtain a CT angiogram of the chest due to his significant bump in his creatinine.  He went from a baseline of 0.9-2.6.  He appeared dehydrated on my exam.  Has improved some with IV fluids.  Heart rates have reduced though he still remains tachycardic will start on Cardizem.  Discussed with the  hospitalist will admit.  They recommended that I discussed the case with cardiology.  I discussed the case with Dr. Domenic Polite, cardiology, he recommended that the patient be started on Cardizem he felt that a low dose would be appropriate up to 5 mg in the drip.  He felt  that the patient likely needs a echocardiogram though would prefer that it happened when his heart rate was improved.  Based on my discussion he felt that cardiology would consult in the morning once more information had been obtained.    CRITICAL CARE Performed by: Cecilio Asper   Total critical care time: 80 minutes  Critical care time was exclusive of separately billable procedures and treating other patients.  Critical care was necessary to treat or prevent imminent or life-threatening deterioration.  Critical care was time spent personally by me on the following activities: development of treatment plan with patient and/or surrogate as well as nursing, discussions with consultants, evaluation of patient's response to treatment, examination of patient, obtaining history from patient or surrogate, ordering and performing treatments and interventions, ordering and review of laboratory studies, ordering and review of radiographic studies, pulse oximetry and re-evaluation of patient's condition.  The patients results and plan were reviewed and discussed.   Any x-rays performed were independently reviewed by myself.   Differential diagnosis were considered with the presenting HPI.  Medications  sodium chloride 0.9 % bolus 1,000 mL (1,000 mLs Intravenous New Bag/Given 12/06/17 1935)  heparin ADULT infusion 100 units/mL (25000 units/229mL sodium chloride 0.45%) (1,500 Units/hr Intravenous New Bag/Given 12/06/17 1931)  sodium chloride 0.9 % bolus 1,000 mL (has no administration in time range)  sodium chloride 0.9 % bolus 1,000 mL (0 mLs Intravenous Stopped 12/06/17 1934)  heparin bolus via infusion 5,000 Units (5,000 Units Intravenous Bolus from Bag 12/06/17 1934)  diltiazem (CARDIZEM) 100 mg in dextrose 5% 173mL (1 mg/mL) infusion (5 mg/hr Intravenous New Bag/Given 12/06/17 1900)    Vitals:   12/06/17 1845 12/06/17 1900 12/06/17 1915 12/06/17 1930  BP: 133/66 (!) 144/83 (!) 124/59 (!)  146/85  Pulse: (!) 54 (!) 43 69 (!) 53  Resp: (!) 22 (!) 21 18 17   Temp:      TempSrc:      SpO2: 99% 100% 100% 100%  Weight:      Height:        Final diagnoses:  AKI (acute kidney injury) ([place])  Atrial fibrillation with RVR ([facility])  Leg swelling    Admission/ observation were discussed with the admitting physician, patient and/or family and they are comfortable with the plan.   Final Clinical Impressions(s) / ED Diagnoses   Final diagnoses:  AKI (acute kidney injury) ([place])  Atrial fibrillation with RVR ([place])  Leg swelling    ED Discharge Orders    None       Deno Etienne, DO 12/06/17 1936

## 2017-12-06 NOTE — Telephone Encounter (Signed)
° °  Pt wife calling and stated pt's leg is cold to touch and is red and purple with swelling.

## 2017-12-06 NOTE — Telephone Encounter (Signed)
Tried calling patient back but did not answer. Instructed them that if they didn't get in contact with the PCP or Cardiologist office, he needs to go to the ER to have the left leg assessed due to being purple and foot is red. Blood sugar was 450 this morning. This message was left on answering machine.

## 2017-12-06 NOTE — Telephone Encounter (Signed)
Patient's wife is calling about patient's left leg is cold to touch, tingling, swollen, red and purple. Patient wife state this started last night. Advised patient to go to the ED to get evaluated. Patient's wife stated she wanted to make sure Dr. Tamala Julian was aware. Will send message to him and his nurse.

## 2017-12-07 ENCOUNTER — Other Ambulatory Visit: Payer: Self-pay

## 2017-12-07 ENCOUNTER — Inpatient Hospital Stay (HOSPITAL_COMMUNITY): Payer: Medicare HMO

## 2017-12-07 DIAGNOSIS — M7989 Other specified soft tissue disorders: Secondary | ICD-10-CM

## 2017-12-07 DIAGNOSIS — R55 Syncope and collapse: Secondary | ICD-10-CM

## 2017-12-07 DIAGNOSIS — I481 Persistent atrial fibrillation: Secondary | ICD-10-CM

## 2017-12-07 DIAGNOSIS — R42 Dizziness and giddiness: Secondary | ICD-10-CM

## 2017-12-07 DIAGNOSIS — I25709 Atherosclerosis of coronary artery bypass graft(s), unspecified, with unspecified angina pectoris: Secondary | ICD-10-CM

## 2017-12-07 DIAGNOSIS — O2230 Deep phlebothrombosis in pregnancy, unspecified trimester: Secondary | ICD-10-CM | POA: Insufficient documentation

## 2017-12-07 DIAGNOSIS — N179 Acute kidney failure, unspecified: Secondary | ICD-10-CM

## 2017-12-07 DIAGNOSIS — I82409 Acute embolism and thrombosis of unspecified deep veins of unspecified lower extremity: Secondary | ICD-10-CM

## 2017-12-07 DIAGNOSIS — I5042 Chronic combined systolic (congestive) and diastolic (congestive) heart failure: Secondary | ICD-10-CM

## 2017-12-07 LAB — CBC WITH DIFFERENTIAL/PLATELET
Basophils Absolute: 0 10*3/uL (ref 0.0–0.1)
Basophils Relative: 1 %
EOS ABS: 0 10*3/uL (ref 0.0–0.7)
EOS PCT: 0 %
HCT: 43.8 % (ref 39.0–52.0)
Hemoglobin: 13.3 g/dL (ref 13.0–17.0)
Lymphocytes Relative: 24 %
Lymphs Abs: 1 10*3/uL (ref 0.7–4.0)
MCH: 22.6 pg — ABNORMAL LOW (ref 26.0–34.0)
MCHC: 30.4 g/dL (ref 30.0–36.0)
MCV: 74.5 fL — AB (ref 78.0–100.0)
MONO ABS: 0.4 10*3/uL (ref 0.1–1.0)
MONOS PCT: 10 %
NEUTROS PCT: 65 %
Neutro Abs: 2.6 10*3/uL (ref 1.7–7.7)
Platelets: 80 10*3/uL — ABNORMAL LOW (ref 150–400)
RBC: 5.88 MIL/uL — ABNORMAL HIGH (ref 4.22–5.81)
RDW: 20.2 % — AB (ref 11.5–15.5)
WBC: 4 10*3/uL (ref 4.0–10.5)

## 2017-12-07 LAB — GLUCOSE, CAPILLARY
GLUCOSE-CAPILLARY: 154 mg/dL — AB (ref 70–99)
Glucose-Capillary: 93 mg/dL (ref 70–99)
Glucose-Capillary: 160 mg/dL — ABNORMAL HIGH (ref 70–99)
Glucose-Capillary: 126 mg/dL — ABNORMAL HIGH (ref 70–99)

## 2017-12-07 LAB — COMPREHENSIVE METABOLIC PANEL
ALT: 22 U/L (ref 0–44)
AST: 23 U/L (ref 15–41)
Albumin: 2.7 g/dL — ABNORMAL LOW (ref 3.5–5.0)
Alkaline Phosphatase: 59 U/L (ref 38–126)
Anion gap: 11 (ref 5–15)
BILIRUBIN TOTAL: 0.6 mg/dL (ref 0.3–1.2)
BUN: 53 mg/dL — AB (ref 8–23)
CO2: 23 mmol/L (ref 22–32)
CREATININE: 2.05 mg/dL — AB (ref 0.61–1.24)
Calcium: 8.5 mg/dL — ABNORMAL LOW (ref 8.9–10.3)
Chloride: 115 mmol/L — ABNORMAL HIGH (ref 98–111)
GFR, EST AFRICAN AMERICAN: 36 mL/min — AB (ref >60)
GFR, EST NON AFRICAN AMERICAN: 31 mL/min — AB (ref >60)
Glucose, Bld: 121 mg/dL — ABNORMAL HIGH (ref 70–99)
POTASSIUM: 3.3 mmol/L — AB (ref 3.5–5.1)
Sodium: 149 mmol/L — ABNORMAL HIGH (ref 135–145)
TOTAL PROTEIN: 5.4 g/dL — AB (ref 6.5–8.1)

## 2017-12-07 LAB — LIPID PANEL
Cholesterol: 149 mg/dL (ref 0–200)
HDL: 33 mg/dL — AB (ref >40)
LDL Cholesterol: 96 mg/dL (ref 0–99)
Total CHOL/HDL Ratio: 4.5 RATIO
Triglycerides: 98 mg/dL (ref <150)
VLDL: 20 mg/dL (ref 0–40)

## 2017-12-07 LAB — HEPARIN LEVEL (UNFRACTIONATED)
HEPARIN UNFRACTIONATED: 1.08 [IU]/mL — AB (ref 0.30–0.70)
HEPARIN UNFRACTIONATED: 0.54 [IU]/mL (ref 0.30–0.70)
Heparin Unfractionated: 0.75 IU/mL — ABNORMAL HIGH (ref 0.30–0.70)

## 2017-12-07 LAB — HIV ANTIBODY (ROUTINE TESTING W REFLEX): HIV SCREEN 4TH GENERATION: NONREACTIVE

## 2017-12-07 LAB — MRSA PCR SCREENING: MRSA BY PCR: NEGATIVE

## 2017-12-07 MED ORDER — AMIODARONE IV BOLUS ONLY 150 MG/100ML
150.0000 mg | Freq: Once | INTRAVENOUS | Status: AC
  Administered 2017-12-07: 150 mg via INTRAVENOUS
  Filled 2017-12-07: qty 100

## 2017-12-07 MED ORDER — SODIUM CHLORIDE 0.45 % IV SOLN
INTRAVENOUS | Status: DC
  Administered 2017-12-07 – 2017-12-09 (×4): via INTRAVENOUS

## 2017-12-07 MED ORDER — AMIODARONE LOAD VIA INFUSION: 150.0000 mg | Freq: Once | INTRAVENOUS | Status: DC

## 2017-12-07 MED ORDER — INSULIN GLARGINE 100 UNIT/ML ~~LOC~~ SOLN
25.0000 [IU] | Freq: Every day | SUBCUTANEOUS | Status: DC
  Administered 2017-12-08 – 2017-12-09 (×2): 25 [IU] via SUBCUTANEOUS
  Filled 2017-12-07 (×3): qty 0.25

## 2017-12-07 MED ORDER — DILTIAZEM HCL-DEXTROSE 100-5 MG/100ML-% IV SOLN (PREMIX)
5.0000 mg/h | INTRAVENOUS | Status: DC
Start: 2017-12-07 — End: 2017-12-07
  Administered 2017-12-07: 5 mg/h via INTRAVENOUS
  Filled 2017-12-07: qty 100

## 2017-12-07 MED ORDER — POTASSIUM CHLORIDE CRYS ER 20 MEQ PO TBCR
40.0000 meq | EXTENDED_RELEASE_TABLET | Freq: Once | ORAL | Status: AC
  Administered 2017-12-07: 40 meq via ORAL
  Filled 2017-12-07: qty 2

## 2017-12-07 MED ORDER — TECHNETIUM TC 99M DIETHYLENETRIAME-PENTAACETIC ACID
30.0000 | Freq: Once | INTRAVENOUS | Status: AC | PRN
  Administered 2017-12-07: 30 via RESPIRATORY_TRACT

## 2017-12-07 MED ORDER — AMIODARONE HCL IN DEXTROSE 360-4.14 MG/200ML-% IV SOLN
60.0000 mg/h | INTRAVENOUS | Status: AC
  Administered 2017-12-07: 60 mg/h via INTRAVENOUS
  Filled 2017-12-07 (×2): qty 200

## 2017-12-07 MED ORDER — TECHNETIUM TO 99M ALBUMIN AGGREGATED
4.0000 | Freq: Once | INTRAVENOUS | Status: AC | PRN
  Administered 2017-12-07: 4 via INTRAVENOUS

## 2017-12-07 MED ORDER — HEPARIN (PORCINE) IN NACL 100-0.45 UNIT/ML-% IJ SOLN
1200.0000 [IU]/h | INTRAMUSCULAR | Status: DC
  Administered 2017-12-07 (×2): 1300 [IU]/h via INTRAVENOUS
  Administered 2017-12-08: 1200 [IU]/h via INTRAVENOUS
  Filled 2017-12-07 (×2): qty 250

## 2017-12-07 MED ORDER — AMIODARONE HCL IN DEXTROSE 360-4.14 MG/200ML-% IV SOLN
30.0000 mg/h | INTRAVENOUS | Status: DC
  Administered 2017-12-07: 30 mg/h via INTRAVENOUS

## 2017-12-07 NOTE — Progress Notes (Signed)
Text paged [facility] D/T patient having runs of V-Tach and HR 130-200. Pt is on a Cardizem drip but has orders to D/C and give po cardizem but pt is refusing stating that he can't swallow D/T his esophageal cancer and his recent chemo, received an order to give a 10 mg IV Cardizem bolus x 1, notified of labs, VS and pt's condition, will continue to monitor.

## 2017-12-07 NOTE — Progress Notes (Signed)
DAILY PROGRESS NOTE   Patient Name: Justin Blackburn. Date of Encounter: 12/07/2017  Chief Complaint   No complaints today  Patient Profile   66M with a history of coronary artery disease status post remote CABG and multiple PCIs, chronic systolic and diastolic heart failure (most recent EF 45 to 50%), PAF not on anticoagulation due to GI bleeding, esophageal cancer undergoing radiation and awaiting esophagectomy, who presents with shortness of breath and palpitations, found to have atrial fibrillation with RVR and concern for left lower extremity DVT/possible PE  Subjective   Converted to NSR overnight - off dilt gtts and now back in a-fib with RVR ([place]). Seems asymptomatic with this. On heparin for possible LE DVT - ultrasound is pending. If positive for DVT, then would recommend VQ scan.  Objective   Vitals:   12/07/17 0632 12/07/17 0702 12/07/17 0728 12/07/17 0831  BP: 94/61 97/68  120/79  Pulse: (!) 59  (!) 57   Resp: 20     Temp:   98 F (36.7 C)   TempSrc:   Oral   SpO2: 94%  98%   Weight: 191 lb 5.8 oz (86.8 kg)     Height:        Intake/Output Summary (Last 24 hours) at 12/07/2017 0845 Last data filed at 12/07/2017 0549 Gross per 24 hour  Intake 2199.03 ml  Output 100 ml  Net 2099.03 ml   Filed Weights   12/06/17 1800 12/06/17 2006 12/07/17 7893  Weight: 201 lb 15.1 oz (91.6 kg) 183 lb 6.8 oz (83.2 kg) 191 lb 5.8 oz (86.8 kg)    Physical Exam   General appearance: alert, pale and no distress Neck: no carotid bruit, no JVD and thyroid not enlarged, symmetric, no tenderness/mass/nodules Lungs: diminished breath sounds bilaterally Heart: irregular tachycardia Abdomen: soft, non-tender; bowel sounds normal; no masses,  no organomegaly Extremities: edema 2+ LLE edema, trace RLE edema Pulses: 2+ and symmetric Skin: pale, warm, dry Neurologic: Mental status: Alert, oriented, thought content appropriate Psych: Pleasant  Inpatient Medications      Scheduled Meds: . aspirin EC  81 mg Oral Daily  . atorvastatin  40 mg Oral q1800  . clopidogrel  75 mg Oral Daily  . diltiazem  120 mg Oral Daily  . ferrous sulfate  325 mg Oral Daily  . gabapentin  300 mg Oral BID  . insulin aspart  0-15 Units Subcutaneous TID WC  . insulin glargine  50 Units Subcutaneous Daily  . irbesartan  300 mg Oral Daily  . linagliptin  5 mg Oral Daily  . pantoprazole  40 mg Oral Daily  . sucralfate  1 g Oral TID WC & HS    Continuous Infusions: . diltiazem (CARDIZEM) infusion    . heparin 1,300 Units/hr (12/07/17 0549)    PRN Meds: acetaminophen, metoprolol tartrate, nitroGLYCERIN, ondansetron (ZOFRAN) IV, prochlorperazine   Labs   Results for orders placed or performed during the hospital encounter of 12/06/17 (from the past 48 hour(s))  CBC with Differential     Status: Abnormal   Collection Time: 12/06/17  5:31 PM  Result Value Ref Range   WBC 3.8 (L) 4.0 - 10.5 K/uL   RBC 6.34 (H) 4.22 - 5.81 MIL/uL   Hemoglobin 14.0 13.0 - 17.0 g/dL   HCT 47.4 39.0 - 52.0 %   MCV 74.8 (L) 78.0 - 100.0 fL   MCH 22.1 (L) 26.0 - 34.0 pg   MCHC 29.5 (L) 30.0 - 36.0 g/dL   RDW  20.6 (H) 11.5 - 15.5 %   Platelets PLATELET CLUMPS NOTED ON SMEAR, UNABLE TO ESTIMATE 150 - 400 K/uL   Neutrophils Relative % 72 %   Lymphocytes Relative 19 %   Monocytes Relative 8 %   Eosinophils Relative 0 %   Basophils Relative 1 %   Neutro Abs 2.8 1.7 - 7.7 K/uL   Lymphs Abs 0.7 0.7 - 4.0 K/uL   Monocytes Absolute 0.3 0.1 - 1.0 K/uL   Eosinophils Absolute 0.0 0.0 - 0.7 K/uL   Basophils Absolute 0.0 0.0 - 0.1 K/uL   RBC Morphology ELLIPTOCYTES    WBC Morphology ATYPICAL LYMPHOCYTES     Comment: INCREASED BANDS (>20% BANDS) Performed at [facility] [address], [place], [place] [address]   Basic metabolic panel     Status: Abnormal   Collection Time: 12/06/17  5:31 PM  Result Value Ref Range   Sodium 145 135 - 145 mmol/L   Potassium 3.4 (L) 3.5 - 5.1 mmol/L    Chloride 105 101 - 111 mmol/L   CO2 23 22 - 32 mmol/L   Glucose, Bld 394 (H) 65 - 99 mg/dL   BUN 57 (H) 6 - 20 mg/dL   Creatinine, Ser 2.65 (H) 0.61 - 1.24 mg/dL   Calcium 8.9 8.9 - 10.3 mg/dL   GFR calc non Af Amer 23 (L) >60 mL/min   GFR calc Af Amer 27 (L) >60 mL/min    Comment: (NOTE) The eGFR has been calculated using the CKD EPI equation. This calculation has not been validated in all clinical situations. eGFR's persistently <60 mL/min signify possible Chronic Kidney Disease.    Anion gap 17 (H) 5 - 15    Comment: Performed at [facility], [place] [address] [place], [place] [address]  Brain natriuretic peptide     Status: Abnormal   Collection Time: 12/06/17  5:31 PM  Result Value Ref Range   B Natriuretic Peptide 121.7 (H) 0.0 - 100.0 pg/mL    Comment: Performed at [facility] [address] [place], [place] [address]  I-stat troponin, ED     Status: None   Collection Time: 12/06/17  5:55 PM  Result Value Ref Range   Troponin i, poc 0.01 0.00 - 0.08 ng/mL   Comment 3            Comment: Due to the release kinetics of cTnI, a negative result within the first hours of the onset of symptoms does not rule out myocardial infarction with certainty. If myocardial infarction is still suspected, repeat the test at appropriate intervals.   I-Stat Chem 8, ED     Status: Abnormal   Collection Time: 12/06/17  5:56 PM  Result Value Ref Range   Sodium 145 135 - 145 mmol/L   Potassium 3.5 3.5 - 5.1 mmol/L   Chloride 108 101 - 111 mmol/L   BUN 53 (H) 6 - 20 mg/dL   Creatinine, Ser 2.50 (H) 0.61 - 1.24 mg/dL   Glucose, Bld 398 (H) 65 - 99 mg/dL   Calcium, Ion 1.16 1.15 - 1.40 mmol/L   TCO2 23 22 - 32 mmol/L   Hemoglobin 15.0 13.0 - 17.0 g/dL   HCT 44.0 39.0 - 52.0 %  Glucose, capillary     Status: Abnormal   Collection Time: 12/06/17  8:05 PM  Result Value Ref Range   Glucose-Capillary 272 (H) 65 - 99 mg/dL  Magnesium     Status: None   Collection  Time: 12/06/17   8:28 PM  Result Value Ref Range   Magnesium 2.0 1.7 - 2.4 mg/dL    Comment: Performed at [facility], [place] [address], [place], [place] [address]  TSH     Status: Abnormal   Collection Time: 12/06/17  8:28 PM  Result Value Ref Range   TSH 0.126 (L) 0.350 - 4.500 uIU/mL    Comment: Performed by a 3rd Generation assay with a functional sensitivity of <=0.01 uIU/mL. Performed at [facility], [place] [address], [place], [place] [address]   MRSA PCR Screening     Status: None   Collection Time: 12/06/17 10:00 PM  Result Value Ref Range   MRSA by PCR NEGATIVE NEGATIVE    Comment:        The GeneXpert MRSA Assay (FDA approved for NASAL specimens only), is one component of a comprehensive MRSA colonization surveillance program. It is not intended to diagnose MRSA infection nor to guide or monitor treatment for MRSA infections. Performed at [facility], [place] [address], [place], [place] [address]   Heparin level (unfractionated)     Status: Abnormal   Collection Time: 12/07/17  3:36 AM  Result Value Ref Range   Heparin Unfractionated 1.08 (H) 0.30 - 0.70 IU/mL    Comment: (NOTE) If heparin results are below expected values, and patient dosage has  been confirmed, suggest follow up testing of antithrombin III levels. Performed at [facility], [place] [address] [place], [place] [address]   Comprehensive metabolic panel     Status: Abnormal   Collection Time: 12/07/17  3:36 AM  Result Value Ref Range   Sodium 149 (H) 135 - 145 mmol/L   Potassium 3.3 (L) 3.5 - 5.1 mmol/L   Chloride 115 (H) 98 - 111 mmol/L   CO2 23 22 - 32 mmol/L   Glucose, Bld 121 (H) 70 - 99 mg/dL   BUN 53 (H) 8 - 23 mg/dL   Creatinine, Ser 2.05 (H) 0.61 - 1.24 mg/dL   Calcium 8.5 (L) 8.9 - 10.3 mg/dL   Total Protein 5.4 (L) 6.5 - 8.1 g/dL   Albumin 2.7 (L) 3.5 - 5.0 g/dL   AST 23 15 - 41 U/L   ALT 22 0 - 44 U/L   Alkaline Phosphatase 59 38 - 126 U/L   Total Bilirubin 0.6 0.3 - 1.2 mg/dL     GFR calc non Af Amer 31 (L) >60 mL/min   GFR calc Af Amer 36 (L) >60 mL/min    Comment: (NOTE) The eGFR has been calculated using the CKD EPI equation. This calculation has not been validated in all clinical situations. eGFR's persistently <60 mL/min signify possible Chronic Kidney Disease.    Anion gap 11 5 - 15    Comment: Performed at [facility] [address], [place], [place] [address]  CBC WITH DIFFERENTIAL     Status: Abnormal   Collection Time: 12/07/17  3:36 AM  Result Value Ref Range   WBC 4.0 4.0 - 10.5 K/uL   RBC 5.88 (H) 4.22 - 5.81 MIL/uL   Hemoglobin 13.3 13.0 - 17.0 g/dL   HCT 43.8 39.0 - 52.0 %   MCV 74.5 (L) 78.0 - 100.0 fL   MCH 22.6 (L) 26.0 - 34.0 pg   MCHC 30.4 30.0 - 36.0 g/dL   RDW 20.2 (H) 11.5 - 15.5 %   Platelets 80 (L) 150 - 400 K/uL    Comment: SPECIMEN CHECKED  FOR CLOTS REPEATED TO VERIFY PLATELET COUNT CONFIRMED BY SMEAR    Neutrophils Relative % 65 %   Lymphocytes Relative 24 %   Monocytes Relative 10 %   Eosinophils Relative 0 %   Basophils Relative 1 %   Neutro Abs 2.6 1.7 - 7.7 K/uL   Lymphs Abs 1.0 0.7 - 4.0 K/uL   Monocytes Absolute 0.4 0.1 - 1.0 K/uL   Eosinophils Absolute 0.0 0.0 - 0.7 K/uL   Basophils Absolute 0.0 0.0 - 0.1 K/uL   RBC Morphology BURR CELLS     Comment: TEARDROP CELLS ELLIPTOCYTES Schistocytes present    WBC Morphology MILD LEFT SHIFT (1-5% METAS, OCC MYELO, OCC BANDS)     Comment: Performed at [facility] [address] [place], [place] [address]  Lipid panel     Status: Abnormal   Collection Time: 12/07/17  3:36 AM  Result Value Ref Range   Cholesterol 149 0 - 200 mg/dL   Triglycerides 98 <150 mg/dL   HDL 33 (L) >40 mg/dL   Total CHOL/HDL Ratio 4.5 RATIO   VLDL 20 0 - 40 mg/dL   LDL Cholesterol 96 0 - 99 mg/dL    Comment:        Total Cholesterol/HDL:CHD Risk Coronary Heart Disease Risk Table                     Men   Women  1/2 Average Risk   3.4   3.3  Average Risk       5.0    4.4  2 X Average Risk   9.6   7.1  3 X Average Risk  23.4   11.0        Use the calculated Patient Ratio above and the CHD Risk Table to determine the patient's CHD Risk.        ATP III CLASSIFICATION (LDL):  <100     mg/dL   Optimal  100-129  mg/dL   Near or Above                    Optimal  130-159  mg/dL   Borderline  160-189  mg/dL   High  >190     mg/dL   Very High Performed at [facility] [address], [place], [place] [address]   Glucose, capillary     Status: None   Collection Time: 12/07/17  7:27 AM  Result Value Ref Range   Glucose-Capillary 93 70 - 99 mg/dL    ECG   N/A  Telemetry   A-fib with aberrancy in RVR - Personally Reviewed  Radiology    Dg Chest Port 1 View  Result Date: 12/06/2017 CLINICAL DATA:  Tachycardia EXAM: PORTABLE CHEST 1 VIEW COMPARISON:  08/30/2017 FINDINGS: Stable cardiomegaly with aortic atherosclerosis. Post CABG change noted with median sternotomy sutures in place. Lungs are free of pulmonary consolidations. No pneumothorax or pleural effusion. Pulmonary edema. No acute osseous abnormality. IMPRESSION: 1. Stable cardiomegaly with post CABG change. 2. Aortic atherosclerosis. 3. No active pulmonary disease. Electronically Signed   By: Ashley Royalty M.D.   On: 12/06/2017 17:42    Cardiac Studies   N/A  Assessment   1. Principal Problem: 2.   A-fib (with RVR) HCC) 3. Active Problems: 4.   Hypertension 5.   Diabetes mellitus ([facility]) 6.   Chronic combined systolic and diastolic heart failure, NYHA class 2 ([facility]) 7.   Coronary artery disease involving coronary bypass graft  of native heart with angina pectoris ([facility]) 8.   Postural dizziness with presyncope 9.   Plan   1. Mr. Cherne has recurrent a-fib with RVR after converting to sinus on a dilt gtts yesterday. Will restart dilt gtts today. LLE ultrasound pending to r/o DVT - on IV heparin. If positive, then would pursue V/Q scan in light of AKI. BP stable today and he feels  well.  Time Spent Directly with Patient:  I have spent a total of 25 minutes with the patient reviewing hospital notes, telemetry, EKGs, labs and examining the patient as well as establishing an assessment and plan that was discussed personally with the patient. > 50% of time was spent in direct patient care.  Length of Stay:  LOS: 1 day   Pixie Casino, MD, [facility], [place] Director of the [facility] Lipid Disorders &  Cardiovascular Risk Reduction Clinic Diplomate of the American Board of Clinical Lipidology Attending Cardiologist  Direct Dial: [phone]  Fax: [phone]  Website:  [email or web address] Niva Murren 12/07/2017, 8:45 AM

## 2017-12-07 NOTE — Progress Notes (Signed)
Dr Harlow Ohms arrived on the unit and was updated on pt's condition, VS and the fact that patient states he can't swallow well since his esophageal cancer and recent chemo. MD aware of rhythm and VS, Cardizem drip maxed out at 15 mg/hr with no changes in HR, will give 5mg  IV Metoprolol and continue to monitor and will try to wean off Cardizem drip.

## 2017-12-07 NOTE — Progress Notes (Signed)
The patient started having a soft blood pressure ~1330 (please see flow sheet). The Cardizem gtt was stop and cardiology was notified and gtt was discontinued. Patient asymptomatic with soft blood pressures. I will continue to monitor the patient closely.   Saddie Benders RN

## 2017-12-07 NOTE — Progress Notes (Addendum)
Preliminary preliminary results by tech - Left Lower ext. Venous duplex completed. Positive for acute deep vein thrombosis in the common femoral vein, femoral vein , popliteal vein and calf veins and also the left external iliac vein. Right iliac vein is patent. The IVC proximal appears patent, distal segment was not clearly visualized.  Results given to Otila Kluver, patient's nurse. Oda Cogan, BS, RDMS, RVT

## 2017-12-07 NOTE — Progress Notes (Signed)
ANTICOAGULATION CONSULT NOTE - Follow Up Consult  Pharmacy Consult for Heparin  Indication: atrial fibrillation, DVT and rule out PE  No Known Allergies  Patient Measurements: Height: 6' (182.9 cm) Weight: 191 lb 5.8 oz (86.8 kg) IBW/kg (Calculated) : 77.6  Vital Signs: Temp: 98 F (36.7 C) (06/25 0728) Temp Source: Oral (06/25 0728) BP: 84/50 (06/25 1336) Pulse Rate: 115 (06/25 1329)  Labs: Recent Labs    12/06/17 1731 12/06/17 1756 12/07/17 0336 12/07/17 1356  HGB 14.0 15.0 13.3  --   HCT 47.4 44.0 43.8  --   PLT PLATELET CLUMPS NOTED ON SMEAR, UNABLE TO ESTIMATE  --  80*  --   HEPARINUNFRC  --   --  1.08* 0.75*  CREATININE 2.65* 2.50* 2.05*  --    Estimated Creatinine Clearance: 37.3 mL/min (A) (by C-G formula based on SCr of 2.05 mg/dL (H)).  Assessment: 67 y/o M on heparin for afib and now with confirmed DVT. Awaiting V/Q scan.  Heparin level remains elevated after rate decrease. No overt bleeding or infusion related issues.    Goal of Therapy:  Heparin level 0.3-0.7 units/ml Monitor platelets by anticoagulation protocol: Yes   Plan:  -Decrease heparin gtt to 1200 units/hr  -2300 heparin level -Daily heparin level and CBC  Kalin Kyler L Tanveer Dobberstein 12/07/2017,3:01 PM

## 2017-12-07 NOTE — Progress Notes (Signed)
PROGRESS NOTE    Justin Blackburn.  IDP:[id number] DOB: 09-Aug-1950 DOA: 12/06/2017 PCP: [facility]   Brief Narrative: Patient is a 67 year old male with past medical history of coronary disease, A. fib not on anticoagulation due to history of GI bleed , history of esophageal cancer, hypertension with dysphagia following radiation therapy who presented to the emergency department with complaints of generalized weakness, palpitation and left lower extremity swelling.  Patient was found to be in A. fib with RVR in the emergency department.  Started on Cardizem drip.  Cardiology consulted.  Patient also has severe left lower extremity edema.  Venous Doppler done today came out to be positive for DVT.  On heparin drip.  Assessment & Plan:   Principal Problem:   A-fib (with RVR) HCC) Active Problems:   Hypertension   Diabetes mellitus (H[facility])   Chronic combined systolic and diastolic heart failure, NYHA class 2 ([facility])   Coronary artery disease involving coronary bypass graft of native heart with angina pectoris ([facility])   Postural dizziness with presyncope  A. fib with RVR: Patient became hypotensive on Cardizem drip today.  He started on amiodarone drip.  Continue IV heparin for anticoagulation.  He will need oral anticoagulation most likely Eliquis or Xarelto on discharge.  Cardiology following.  Left lower extremity DVT: Venous Doppler positive for DVT.Showed acute deep vein thrombosis in the common femoral vein, femoral vein , popliteal vein and calf veins and also the left external iliac vein. Will check VQ scan for ruling out pulmonary embolism.  On heparin drip already.  Oral anticoagulation on discharge.  History of esophageal cancer: Follows with oncology as an outpatient.  Patient is just on pured diet due to dysphagia secondary to radiation therapy.  He is being planned for esophagectomy.  He has poor oral intake.  Will request for speech evaluation.  History of  hypertension: Currently hypotensive because of Cardizem drip.  Will resume his home medications after his blood pressure stabilizes.  History of coronary disease: Status post CABG x2. S/P PCI. Denies any chest pain at present.  On aspirin,Plavix at home.Continue statin.  Acute kidney injury: Found to be severely dehydrated on presentation.  Started on IV fluids.  Currently on half-normal saline secondary to hypernatremia.  History of chronic systolics and diastolic heart failure: Most recent ejection fraction is 40 to 45%.  On Lasix,cande sartan at home which are on hold due to acute kidney injury.  Diabetes mellitus: Continue to monitor blood sugars.  Currently on sliding scale  insulin.  On insulin at home.   DVT prophylaxis: IV heparin Code Status: Full Family Communication: None present at the bedside Disposition Plan: Likely home in 2 to 3 days after resolution of AKI, A. fib with RVR   Consultants: Cardiology  Procedures: None  Antimicrobials: None  Subjective: Patient seen and examined the bedside this morning.  Was in A. fib with RVR.  Complains of poor oral intake and weakness.  No overnight fever, nausea or vomiting.  Objective: Vitals:   12/07/17 1329 12/07/17 1334 12/07/17 1335 12/07/17 1336  BP: (!) 92/52 (!) 75/49 (!) 92/52 (!) 84/50  Pulse: (!) 115     Resp:  (!) 22    Temp:      TempSrc:      SpO2:      Weight:      Height:        Intake/Output Summary (Last 24 hours) at 12/07/2017 1448 Last data filed at 12/07/2017 1221 Gross  per 24 hour  Intake 2212.24 ml  Output 500 ml  Net 1712.24 ml   Filed Weights   12/06/17 1800 12/06/17 2006 12/07/17 0632  Weight: 91.6 kg (201 lb 15.1 oz) 83.2 kg (183 lb 6.8 oz) 86.8 kg (191 lb 5.8 oz)    Examination:  General exam: Not in distress, chronically ill HEENT:PERRL,Oral mucosa moist, Ear/Nose normal on gross exam Respiratory system: Bilateral equal air entry, normal vesicular breath sounds, no wheezes or  crackles  Cardiovascular system: A. fib with RVR, no JVD, murmurs, rubs, gallops or clicks.  Gastrointestinal system: Abdomen is nondistended, soft and nontender. No organomegaly or masses felt. Normal bowel sounds heard. Central nervous system: Alert and oriented. No focal neurological deficits. Extremities: Severe edema on the left lower extremity, no clubbing ,no cyanosis, distal peripheral pulses palpable. Skin: No rashes, lesions or ulcers,no icterus ,no pallor MSK: Normal muscle bulk,tone ,power Psychiatry: Judgement and insight appear normal. Mood & affect appropriate.     Data Reviewed: I have personally reviewed following labs and imaging studies  CBC: Recent Labs  Lab 12/02/17 0733 12/06/17 1731 12/06/17 1756 12/07/17 0336  WBC 1.5* 3.8*  --  4.0  NEUTROABS 0.9* 2.8  --  2.6  HGB 13.7 14.0 15.0 13.3  HCT 42.8 47.4 44.0 43.8  MCV 72.4* 74.8*  --  74.5*  PLT 110* PLATELET CLUMPS NOTED ON SMEAR, UNABLE TO ESTIMATE  --  80*   Basic Metabolic Panel: Recent Labs  Lab 12/02/17 0733 12/06/17 1731 12/06/17 1756 12/06/17 2028 12/07/17 0336  NA 145 145 145  --  149*  K 3.0* 3.4* 3.5  --  3.3*  CL 107 105 108  --  115*  CO2 26 23  --   --  23  GLUCOSE 208* 394* 398*  --  121*  BUN 27* 57* 53*  --  53*  CREATININE 1.01 2.65* 2.50*  --  2.05*  CALCIUM 9.5 8.9  --   --  8.5*  MG  --   --   --  2.0  --    GFR: Estimated Creatinine Clearance: 37.3 mL/min (A) (by C-G formula based on SCr of 2.05 mg/dL (H)). Liver Function Tests: Recent Labs  Lab 12/02/17 0733 12/07/17 0336  AST 17 23  ALT 11 22  ALKPHOS 62 59  BILITOT 0.8 0.6  PROT 6.5 5.4*  ALBUMIN 3.1* 2.7*   No results for input(s): LIPASE, AMYLASE in the last 168 hours. No results for input(s): AMMONIA in the last 168 hours. Coagulation Profile: No results for input(s): INR, PROTIME in the last 168 hours. Cardiac Enzymes: No results for input(s): CKTOTAL, CKMB, CKMBINDEX, TROPONINI in the last 168  hours. BNP (last 3 results) No results for input(s): PROBNP in the last 8760 hours. HbA1C: No results for input(s): HGBA1C in the last 72 hours. CBG: Recent Labs  Lab 12/06/17 2005 12/07/17 0727 12/07/17 1141  GLUCAP 272* 93 154*   Lipid Profile: Recent Labs    12/07/17 0336  CHOL 149  HDL 33*  LDLCALC 96  TRIG 98  CHOLHDL 4.5   Thyroid Function Tests: Recent Labs    12/06/17 2028  TSH 0.126*   Anemia Panel: No results for input(s): VITAMINB12, FOLATE, FERRITIN, TIBC, IRON, RETICCTPCT in the last 72 hours. Sepsis Labs: No results for input(s): PROCALCITON, LATICACIDVEN in the last 168 hours.  Recent Results (from the past 240 hour(s))  MRSA PCR Screening     Status: None   Collection Time: 12/06/17 10:00 PM  Result Value Ref Range Status   MRSA by PCR NEGATIVE NEGATIVE Final    Comment:        The GeneXpert MRSA Assay (FDA approved for NASAL specimens only), is one component of a comprehensive MRSA colonization surveillance program. It is not intended to diagnose MRSA infection nor to guide or monitor treatment for MRSA infections. Performed at [facility], [place] [address], [place], [place] [address]          Radiology Studies: Dg Chest Port 1 View  Result Date: 12/06/2017 CLINICAL DATA:  Tachycardia EXAM: PORTABLE CHEST 1 VIEW COMPARISON:  08/30/2017 FINDINGS: Stable cardiomegaly with aortic atherosclerosis. Post CABG change noted with median sternotomy sutures in place. Lungs are free of pulmonary consolidations. No pneumothorax or pleural effusion. Pulmonary edema. No acute osseous abnormality. IMPRESSION: 1. Stable cardiomegaly with post CABG change. 2. Aortic atherosclerosis. 3. No active pulmonary disease. Electronically Signed   By: Ashley Royalty M.D.   On: 12/06/2017 17:42        Scheduled Meds: . aspirin EC  81 mg Oral Daily  . atorvastatin  40 mg Oral q1800  . clopidogrel  75 mg Oral Daily  . diltiazem  120 mg Oral Daily  . ferrous  sulfate  325 mg Oral Daily  . gabapentin  300 mg Oral BID  . insulin aspart  0-15 Units Subcutaneous TID WC  . insulin glargine  50 Units Subcutaneous Daily  . irbesartan  300 mg Oral Daily  . linagliptin  5 mg Oral Daily  . pantoprazole  40 mg Oral Daily  . sucralfate  1 g Oral TID WC & HS   Continuous Infusions: . sodium chloride 75 mL/hr at 12/07/17 1315  . amiodarone    . heparin 1,300 Units/hr (12/07/17 1301)     LOS: 1 day    Time spent: More than 50% of that time was spent in counseling and/or coordination of care.      Shelly Coss, MD [facility] Pager [phone]  If 7PM-7AM, please contact night-coverage [email or web address] Password [facility] 12/07/2017, 2:48 PM

## 2017-12-07 NOTE — Progress Notes (Signed)
ANTICOAGULATION CONSULT NOTE - Follow Up Consult  Pharmacy Consult for Heparin  Indication: atrial fibrillation and rule out PE  No Known Allergies  Patient Measurements: Height: 6' (182.9 cm) Weight: 183 lb 6.8 oz (83.2 kg) IBW/kg (Calculated) : 77.6  Vital Signs: Temp: 97.8 F (36.6 C) (06/24 2006) Temp Source: Oral (06/24 2006) BP: 125/81 (06/25 0200) Pulse Rate: 63 (06/25 0200)  Labs: Recent Labs    12/06/17 1731 12/06/17 1756 12/07/17 0336  HGB 14.0 15.0 13.3  HCT 47.4 44.0 43.8  PLT PLATELET CLUMPS NOTED ON SMEAR, UNABLE TO ESTIMATE  --  PENDING  HEPARINUNFRC  --   --  1.08*  CREATININE 2.65* 2.50*  --     Estimated Creatinine Clearance: 30.6 mL/min (A) (by C-G formula based on SCr of 2.5 mg/dL (H)).  Assessment: 67 y/o M on heparin for afib and r/o VTE. Heparin level is elevated this AM. Hgb stable. No issues per RN.   Goal of Therapy:  Heparin level 0.3-0.7 units/ml Monitor platelets by anticoagulation protocol: Yes   Plan:  -Hold heparin x 30 mins  -Re-start heparin at 1300 units/hr at 0530 -1330 HL  Narda Bonds 12/07/2017,4:48 AM

## 2017-12-07 NOTE — Progress Notes (Signed)
Pt has ahad several ruins of V-tach from 14-19 beats but remains asymptomatic. I spoke with the charge RN because he doesn't have a Cardiology note in the computer and his rate is running from the 110-200 range non-sustained,Cardizem drip titrated up from 10mg  to 15mg  since he has arrived with little change to his rate and B/P 120-140's/50-70's, left a voice message for Dr Jerilynn Mages. Jeannie Fend who saw and admitted this patient to the unit, awaiting a call back, will continue to closely monitor. Pt's left leg is large, hard, tender to touch, cold and dusky, doppler pulses found, will continue to monitor.

## 2017-12-08 ENCOUNTER — Other Ambulatory Visit: Payer: Self-pay

## 2017-12-08 ENCOUNTER — Ambulatory Visit: Payer: Medicare HMO | Admitting: Interventional Cardiology

## 2017-12-08 DIAGNOSIS — N179 Acute kidney failure, unspecified: Secondary | ICD-10-CM

## 2017-12-08 DIAGNOSIS — I4891 Unspecified atrial fibrillation: Secondary | ICD-10-CM

## 2017-12-08 DIAGNOSIS — I824Y2 Acute embolism and thrombosis of unspecified deep veins of left proximal lower extremity: Secondary | ICD-10-CM

## 2017-12-08 LAB — CBC
HCT: 42.5 % (ref 39.0–52.0)
Hemoglobin: 12.8 g/dL — ABNORMAL LOW (ref 13.0–17.0)
MCH: 22.3 pg — AB (ref 26.0–34.0)
MCHC: 30.1 g/dL (ref 30.0–36.0)
MCV: 74.2 fL — ABNORMAL LOW (ref 78.0–100.0)
PLATELETS: 92 10*3/uL — AB (ref 150–400)
RBC: 5.73 MIL/uL (ref 4.22–5.81)
RDW: 20.9 % — ABNORMAL HIGH (ref 11.5–15.5)
WBC: 3.8 10*3/uL — ABNORMAL LOW (ref 4.0–10.5)

## 2017-12-08 LAB — BASIC METABOLIC PANEL
Anion gap: 8 (ref 5–15)
BUN: 62 mg/dL — AB (ref 8–23)
CALCIUM: 8.3 mg/dL — AB (ref 8.9–10.3)
CO2: 25 mmol/L (ref 22–32)
CREATININE: 2.16 mg/dL — AB (ref 0.61–1.24)
Chloride: 111 mmol/L (ref 98–111)
GFR calc Af Amer: 34 mL/min — ABNORMAL LOW (ref >60)
GFR, EST NON AFRICAN AMERICAN: 29 mL/min — AB (ref >60)
Glucose, Bld: 101 mg/dL — ABNORMAL HIGH (ref 70–99)
Potassium: 3.6 mmol/L (ref 3.5–5.1)
SODIUM: 144 mmol/L (ref 135–145)

## 2017-12-08 LAB — GLUCOSE, CAPILLARY
GLUCOSE-CAPILLARY: 147 mg/dL — AB (ref 70–99)
GLUCOSE-CAPILLARY: 163 mg/dL — AB (ref 70–99)
Glucose-Capillary: 105 mg/dL — ABNORMAL HIGH (ref 70–99)
Glucose-Capillary: 164 mg/dL — ABNORMAL HIGH (ref 70–99)

## 2017-12-08 LAB — HEPARIN LEVEL (UNFRACTIONATED): Heparin Unfractionated: 0.62 IU/mL (ref 0.30–0.70)

## 2017-12-08 MED ORDER — AMIODARONE HCL 200 MG PO TABS
400.0000 mg | ORAL_TABLET | Freq: Two times a day (BID) | ORAL | Status: DC
  Administered 2017-12-08 – 2017-12-10 (×5): 400 mg via ORAL
  Filled 2017-12-08 (×5): qty 2

## 2017-12-08 NOTE — Progress Notes (Signed)
  Radiation Oncology         [phone]) [phone] ________________________________  Name: Justin Blackburn. MRN: [id number]  Date: 12/02/2017  DOB: [birth year]  End of Treatment Note  Diagnosis:   67 y.o. male with Stage III, cT3N0M0 adenocarcinoma of the GE junction    Indication for treatment:  Curative       Radiation treatment dates:   10/25/2017 - 12/02/2017  Site/dose:   The esophagus was treated to 50 Gy in 25 fractions initially, also using a simultaneous boost technique to 45 Gy. A boost was then given to 6 Gy/5.4 Gy using a SIB technique to yield 56 Gy to the high dose target and 50.4 Gy to the lower dose target.  Beams/energy:   IMRT / 6X Photon  Narrative: The patient tolerated radiation treatment relatively well with concurrent chemotherapy.  He experienced worsening esophagitis which required management with Hycet as Carafate was not effective for him. He did lose 40-45 pounds over the course of treatment as a result of poor po intake and diarrhea. He was encouraged to replace with fluids and supplement with Boost or Ensure drinks.   Plan: The patient has completed radiation treatment. The patient will return to radiation oncology clinic for routine followup in one month. I advised them to call or return sooner if they have any questions or concerns related to their recovery or treatment.  ------------------------------------------------  Jodelle Gross, MD, PhD  This document serves as a record of services personally performed by Kyung Rudd, MD. It was created on his behalf by Rae Lips, a trained medical scribe. The creation of this record is based on the scribe's personal observations and the provider's statements to them. This document has been checked and approved by the attending provider.

## 2017-12-08 NOTE — Telephone Encounter (Signed)
Spoke to patient at the hospital yesterday.

## 2017-12-08 NOTE — Progress Notes (Addendum)
PROGRESS NOTE    Justin Blackburn.  XBJ:[id number] DOB: [birth year] DOA: 12/06/2017 PCP: Practice, [facility]   Brief Narrative: Patient is a 67 year old male with past medical history of coronary disease, A. fib not on anticoagulation due to history of GI bleed , history of esophageal cancer s/p XRT, hypertension, dysphagia following radiation therapy who presented to the emergency department with complaints of generalized weakness, palpitation and left lower extremity swelling.  Patient was found to be in A. fib with RVR in the emergency department.  Started on Cardizem drip.  Cardiology consulted.  Patient also has severe left lower extremity edema.  Venous Doppler positive for DVT.  On heparin drip.  Assessment & Plan:   Principal Problem:   A-fib (with RVR) HCC) Active Problems:   Hypertension   Diabetes mellitus (HCC)   Chronic combined systolic and diastolic heart failure, NYHA class 2 (HCC)   Coronary artery disease involving coronary bypass graft of native heart with angina pectoris (HCC)   Postural dizziness with presyncope   DVT (deep venous thrombosis) (HCC)   AKI (acute kidney injury) ([place])  A. fib with RVR: Patient became hypotensive on Cardizem drip 6/25.  He started on amiodarone drip.  Continued IV heparin for anticoagulation.  He will need oral anticoagulation most likely Eliquis or Xarelto on discharge.  Cardiology following.  Now rate controlled.  Discussed with Dr. Debara Pickett.  Transitioning to oral amiodarone.  I discussed in detail with Dr. Alen Blew, patient's primary oncologist who sees no contraindication to resuming Eliquis.  Left lower extremity DVT: Venous Doppler positive for DVT.Showed acute deep vein thrombosis in the common femoral vein, femoral vein , popliteal vein and calf veins and also the left external iliac vein. VQ scan negative for PE. I discussed with PCCM on 6/26 who indicated that unless patient not a candidate for anticoagulation or if patient  had mobile clot, would not recommend IVC filter. Currently on IV heparin.  As discussed above, no absolute contraindications to oral anticoagulation and hence plans to transition to Eliquis when okay with cardiology.  History of esophageal cancer: Follows with oncology as an outpatient.  Patient is just on pured diet due to dysphagia secondary to radiation therapy.  He is being planned for esophagectomy.  He has poor oral intake.  Discussed with speech therapy and their input appreciated.  They recommend regular diet and thin liquids.  History of hypertension: Was hypotensive because of Cardizem drip.  Will resume his home medications after his blood pressure stabilizes.  Hypotension resolved.  Blood pressures currently controlled.  History of coronary disease: Status post CABG x2. S/P PCI. Denies any chest pain at present.  On aspirin,Plavix at home.Continue statin.  May have to discontinue one or both antiplatelets when Eliquis started> defer to Cardiology.  Acute kidney injury/hypernatremia: Found to be severely dehydrated on presentation.  Started on IV fluids.  Currently on half-normal saline secondary to hypernatremia.  Creatinine continues improved but has plateaued over the last 2 days.  Continue IV fluids and follow BMP in a.m.  Hypernatremia resolved.  Hypokalemia: Replaced.  History of chronic systolics and diastolic heart failure: Most recent ejection fraction is 40 to 45%.  On Lasix,cande sartan at home which are on hold due to acute kidney injury.  No overt features of CHF except left lower extremity edema secondary to acute DVT.  Diabetes mellitus: Continue to monitor blood sugars.  Currently on sliding scale  insulin.  On insulin at home.  Reasonable inpatient control.  Thrombocytopenia: Unclear etiology.  Had platelet count of 101 on 6/13.?  Related to radiation.  Stable over the last 48 hours.  Continue to follow CBCs.   DVT prophylaxis: IV heparin Code Status: Full Family  Communication: I discussed in detail with patient's spouse and son at bedside.  Updated care and answered questions. Disposition Plan: Likely home in 1-2 days after resolution of AKI, A. fib with RVR   Consultants: Cardiology  Procedures: None  Antimicrobials: None  Subjective: Patient reports feeling better.  Some throat discomfort related to radiation treatment which he completed a week ago.  No swallowing difficulty.  No chest pain or dyspnea reported.  Left lower extremity swelling without pain.  No palpitations.  Objective: Vitals:   12/08/17 0000 12/08/17 0500 12/08/17 1421 12/08/17 1615  BP:  93/73 114/76   Pulse: (!) 118 (!) 120 86   Resp: (!) 23 20    Temp:  98 F (36.7 C)  98.6 F (37 C)  TempSrc:  Oral    SpO2: 96% 94% 98%   Weight:  87.8 kg (193 lb 9 oz)    Height:        Intake/Output Summary (Last 24 hours) at 12/08/2017 1745 Last data filed at 12/08/2017 1000 Gross per 24 hour  Intake 2900.41 ml  Output -  Net 2900.41 ml   Filed Weights   12/06/17 2006 12/07/17 0632 12/08/17 0500  Weight: 83.2 kg (183 lb 6.8 oz) 86.8 kg (191 lb 5.8 oz) 87.8 kg (193 lb 9 oz)    Examination:  General exam: Pleasant middle-aged male, moderately built and nourished, sitting up comfortably in bed. Respiratory system: Bilateral equal air entry, normal vesicular breath sounds, no wheezes or crackles.  No increased work of breathing. Cardiovascular system: S1 and S2 heard, irregularly irregular.  No JVD or murmurs.  Left lower extremity 2+ pitting edema.  No right lower extremity edema.  Telemetry personally reviewed: A. fib with controlled ventricular rate. Gastrointestinal system: Abdomen is nondistended, soft and nontender. No organomegaly or masses felt. Normal bowel sounds heard.  Stable. Central nervous system: Alert and oriented. No focal neurological deficits.  Stable. Extremities: Severe edema on the left lower extremity without any other acute findings, no clubbing ,no  cyanosis, distal peripheral pulses palpable. Skin: No rashes, lesions or ulcers,no icterus ,no pallor MSK: Normal muscle bulk,tone ,power Psychiatry: Judgement and insight appear normal. Mood & affect appropriate.     Data Reviewed: I have personally reviewed following labs and imaging studies  CBC: Recent Labs  Lab 12/02/17 0733 12/06/17 1731 12/06/17 1756 12/07/17 0336 12/08/17 0549  WBC 1.5* 3.8*  --  4.0 3.8*  NEUTROABS 0.9* 2.8  --  2.6  --   HGB 13.7 14.0 15.0 13.3 12.8*  HCT 42.8 47.4 44.0 43.8 42.5  MCV 72.4* 74.8*  --  74.5* 74.2*  PLT 110* PLATELET CLUMPS NOTED ON SMEAR, UNABLE TO ESTIMATE  --  80* 92*   Basic Metabolic Panel: Recent Labs  Lab 12/02/17 0733 12/06/17 1731 12/06/17 1756 12/06/17 2028 12/07/17 0336 12/08/17 0549  NA 145 145 145  --  149* 144  K 3.0* 3.4* 3.5  --  3.3* 3.6  CL 107 105 108  --  115* 111  CO2 26 23  --   --  23 25  GLUCOSE 208* 394* 398*  --  121* 101*  BUN 27* 57* 53*  --  53* 62*  CREATININE 1.01 2.65* 2.50*  --  2.05* 2.16*  CALCIUM 9.5 8.9  --   --  8.5* 8.3*  MG  --   --   --  2.0  --   --    GFR: Estimated Creatinine Clearance: 35.4 mL/min (A) (by C-G formula based on SCr of 2.16 mg/dL (H)). Liver Function Tests: Recent Labs  Lab 12/02/17 0733 12/07/17 0336  AST 17 23  ALT 11 22  ALKPHOS 62 59  BILITOT 0.8 0.6  PROT 6.5 5.4*  ALBUMIN 3.1* 2.7*   CBG: Recent Labs  Lab 12/07/17 1553 12/07/17 2058 12/08/17 0730 12/08/17 1202 12/08/17 1613  GLUCAP 160* 126* 105* 164* 147*   Lipid Profile: Recent Labs    12/07/17 0336  CHOL 149  HDL 33*  LDLCALC 96  TRIG 98  CHOLHDL 4.5   Thyroid Function Tests: Recent Labs    12/06/17 2028  TSH 0.126*    Recent Results (from the past 240 hour(s))  MRSA PCR Screening     Status: None   Collection Time: 12/06/17 10:00 PM  Result Value Ref Range Status   MRSA by PCR NEGATIVE NEGATIVE Final    Comment:        The GeneXpert MRSA Assay (FDA approved for NASAL  specimens only), is one component of a comprehensive MRSA colonization surveillance program. It is not intended to diagnose MRSA infection nor to guide or monitor treatment for MRSA infections. Performed at [facility], [place] [address] [place], [place] [address]          Radiology Studies: Dg Chest 2 View  Result Date: 12/07/2017 CLINICAL DATA:  Tachycardia.  Left leg DVT.  Ex-smoker. EXAM: CHEST - 2 VIEW COMPARISON:  12/06/2017. FINDINGS: Stable enlarged cardiac silhouette and post CABG changes. Clear lungs with normal vascularity. Moderate right AC joint inferior spur formation. Thoracic spine degenerative changes. Coronary artery stent. IMPRESSION: No acute abnormality.  Stable cardiomegaly. Electronically Signed   By: Claudie Revering M.D.   On: 12/07/2017 19:45   Nm Pulmonary Perf And Vent  Result Date: 12/07/2017 CLINICAL DATA:  Left leg DVT period hypotension.  Ex-smoker. EXAM: NUCLEAR MEDICINE VENTILATION - PERFUSION LUNG SCAN TECHNIQUE: Ventilation images were obtained in multiple projections using inhaled aerosol Tc-15m DTPA. Perfusion images were obtained in multiple projections after intravenous injection of Tc-51m-MAA. RADIOPHARMACEUTICALS:  30.3 mCi of Tc-72m DTPA aerosol inhalation and 4.3 mCi Tc60m-MAA IV COMPARISON:  Chest radiographs obtained today. FINDINGS: Ventilation: Normal ventilation of both lungs. Perfusion: Normal perfusion of both lungs with no perfusion defects. IMPRESSION: Normal examination.  No evidence of pulmonary embolism. Electronically Signed   By: Claudie Revering M.D.   On: 12/07/2017 19:59        Scheduled Meds: . amiodarone  400 mg Oral BID  . aspirin EC  81 mg Oral Daily  . atorvastatin  40 mg Oral q1800  . clopidogrel  75 mg Oral Daily  . ferrous sulfate  325 mg Oral Daily  . gabapentin  300 mg Oral BID  . insulin aspart  0-15 Units Subcutaneous TID WC  . insulin glargine  25 Units Subcutaneous Daily  . linagliptin  5 mg Oral Daily  .  pantoprazole  40 mg Oral Daily  . sucralfate  1 g Oral TID WC & HS   Continuous Infusions: . sodium chloride 75 mL/hr at 12/08/17 1739  . heparin 1,200 Units/hr (12/08/17 1222)     LOS: 2 days    Time spent: More than 50% of that time was spent in counseling and/or coordination of care.      Vernell Leep, MD,  FACP, FHM. [facility] Pager [phone]  If 7PM-7AM, please contact night-coverage [email or web address] Password [facility] 12/08/2017, 5:57 PM

## 2017-12-08 NOTE — Progress Notes (Signed)
ANTICOAGULATION CONSULT NOTE - Follow Up Consult  Pharmacy Consult for Heparin  Indication: atrial fibrillation and rule out PE  No Known Allergies  Patient Measurements: Height: 6' (182.9 cm) Weight: 191 lb 5.8 oz (86.8 kg) IBW/kg (Calculated) : 77.6  Vital Signs: Temp: 98.2 F (36.8 C) (06/25 2100) Temp Source: Oral (06/25 2100) BP: 74/57 (06/25 2130) Pulse Rate: 118 (06/26 0000)  Labs: Recent Labs    12/06/17 1731 12/06/17 1756 12/07/17 0336 12/07/17 1356 12/07/17 2303  HGB 14.0 15.0 13.3  --   --   HCT 47.4 44.0 43.8  --   --   PLT PLATELET CLUMPS NOTED ON SMEAR, UNABLE TO ESTIMATE  --  80*  --   --   HEPARINUNFRC  --   --  1.08* 0.75* 0.54  CREATININE 2.65* 2.50* 2.05*  --   --     Estimated Creatinine Clearance: 37.3 mL/min (A) (by C-G formula based on SCr of 2.05 mg/dL (H)).  Assessment: 67 y/o M on heparin for afib and r/o VTE. Heparin level is therapeutic x 1 after rate decrease.   Goal of Therapy:  Heparin level 0.3-0.7 units/ml Monitor platelets by anticoagulation protocol: Yes   Plan:  -Cont heparin at 1200 units/hr -Confirmatory heparin level with AM labs  Narda Bonds 12/08/2017,12:26 AM

## 2017-12-08 NOTE — Progress Notes (Signed)
DAILY PROGRESS NOTE   Patient Name: Justin Blackburn. Date of Encounter: 12/08/2017  Chief Complaint   No complaints today  Patient Profile   56M with a history of coronary artery disease status post remote CABG and multiple PCIs, chronic systolic and diastolic heart failure (most recent EF 45 to 50%), PAF not on anticoagulation due to GI bleeding, esophageal cancer undergoing radiation and awaiting esophagectomy, who presents with shortness of breath and palpitations, found to have atrial fibrillation with RVR and concern for left lower extremity DVT/possible PE  Subjective   Feels better today - now rate controlled on amiodarone gtts. VQ scan was negative for PE. He does have extensive thrombus of the LLE up to the common femoral vein.  Objective   Vitals:   12/07/17 2130 12/07/17 2139 12/08/17 0000 12/08/17 0500  BP: (!) 74/57   93/73  Pulse:  (!) 124 (!) 118 (!) 120  Resp:  (!) 21 (!) 23 20  Temp:    98 F (36.7 C)  TempSrc:    Oral  SpO2:  96% 96% 94%  Weight:    193 lb 9 oz (87.8 kg)  Height:        Intake/Output Summary (Last 24 hours) at 12/08/2017 0857 Last data filed at 12/08/2017 0403 Gross per 24 hour  Intake 1824.22 ml  Output 400 ml  Net 1424.22 ml   Filed Weights   12/06/17 2006 12/07/17 0632 12/08/17 0500  Weight: 183 lb 6.8 oz (83.2 kg) 191 lb 5.8 oz (86.8 kg) 193 lb 9 oz (87.8 kg)    Physical Exam   General appearance: alert, pale and no distress Neck: no carotid bruit, no JVD and thyroid not enlarged, symmetric, no tenderness/mass/nodules Lungs: diminished breath sounds bilaterally Heart: irregularly irregular rhythm Abdomen: soft, non-tender; bowel sounds normal; no masses,  no organomegaly Extremities: edema 2+ LLE edema, trace RLE edema Pulses: 2+ and symmetric Skin: pale, warm, dry Neurologic: Mental status: Alert, oriented, thought content appropriate Psych: Appears frustrated  Inpatient Medications    Scheduled Meds: . aspirin EC   81 mg Oral Daily  . atorvastatin  40 mg Oral q1800  . clopidogrel  75 mg Oral Daily  . ferrous sulfate  325 mg Oral Daily  . gabapentin  300 mg Oral BID  . insulin aspart  0-15 Units Subcutaneous TID WC  . insulin glargine  25 Units Subcutaneous Daily  . linagliptin  5 mg Oral Daily  . pantoprazole  40 mg Oral Daily  . sucralfate  1 g Oral TID WC & HS    Continuous Infusions: . sodium chloride 75 mL/hr at 12/08/17 0403  . amiodarone 30 mg/hr (12/08/17 0403)  . heparin 1,200 Units/hr (12/08/17 0403)    PRN Meds: acetaminophen, metoprolol tartrate, nitroGLYCERIN, ondansetron (ZOFRAN) IV, prochlorperazine   Labs   Results for orders placed or performed during the hospital encounter of 12/06/17 (from the past 48 hour(s))  CBC with Differential     Status: Abnormal   Collection Time: 12/06/17  5:31 PM  Result Value Ref Range   WBC 3.8 (L) 4.0 - 10.5 K/uL   RBC 6.34 (H) 4.22 - 5.81 MIL/uL   Hemoglobin 14.0 13.0 - 17.0 g/dL   HCT 47.4 39.0 - 52.0 %   MCV 74.8 (L) 78.0 - 100.0 fL   MCH 22.1 (L) 26.0 - 34.0 pg   MCHC 29.5 (L) 30.0 - 36.0 g/dL   RDW 20.6 (H) 11.5 - 15.5 %   Platelets PLATELET CLUMPS  NOTED ON SMEAR, UNABLE TO ESTIMATE 150 - 400 K/uL   Neutrophils Relative % 72 %   Lymphocytes Relative 19 %   Monocytes Relative 8 %   Eosinophils Relative 0 %   Basophils Relative 1 %   Neutro Abs 2.8 1.7 - 7.7 K/uL   Lymphs Abs 0.7 0.7 - 4.0 K/uL   Monocytes Absolute 0.3 0.1 - 1.0 K/uL   Eosinophils Absolute 0.0 0.0 - 0.7 K/uL   Basophils Absolute 0.0 0.0 - 0.1 K/uL   RBC Morphology ELLIPTOCYTES    WBC Morphology ATYPICAL LYMPHOCYTES     Comment: INCREASED BANDS (>20% BANDS) Performed at [facility] [address], [place], [place] [address]   Basic metabolic panel     Status: Abnormal   Collection Time: 12/06/17  5:31 PM  Result Value Ref Range   Sodium 145 135 - 145 mmol/L   Potassium 3.4 (L) 3.5 - 5.1 mmol/L   Chloride 105 101 - 111 mmol/L   CO2 23 22 - 32  mmol/L   Glucose, Bld 394 (H) 65 - 99 mg/dL   BUN 57 (H) 6 - 20 mg/dL   Creatinine, Ser 2.65 (H) 0.61 - 1.24 mg/dL   Calcium 8.9 8.9 - 10.3 mg/dL   GFR calc non Af Amer 23 (L) >60 mL/min   GFR calc Af Amer 27 (L) >60 mL/min    Comment: (NOTE) The eGFR has been calculated using the CKD EPI equation. This calculation has not been validated in all clinical situations. eGFR's persistently <60 mL/min signify possible Chronic Kidney Disease.    Anion gap 17 (H) 5 - 15    Comment: Performed at [facility], [place] [address] [place], [place] [address]  Brain natriuretic peptide     Status: Abnormal   Collection Time: 12/06/17  5:31 PM  Result Value Ref Range   B Natriuretic Peptide 121.7 (H) 0.0 - 100.0 pg/mL    Comment: Performed at [facility] [address], [place], [place] [address]  I-stat troponin, ED     Status: None   Collection Time: 12/06/17  5:55 PM  Result Value Ref Range   Troponin i, poc 0.01 0.00 - 0.08 ng/mL   Comment 3            Comment: Due to the release kinetics of cTnI, a negative result within the first hours of the onset of symptoms does not rule out myocardial infarction with certainty. If myocardial infarction is still suspected, repeat the test at appropriate intervals.   I-Stat Chem 8, ED     Status: Abnormal   Collection Time: 12/06/17  5:56 PM  Result Value Ref Range   Sodium 145 135 - 145 mmol/L   Potassium 3.5 3.5 - 5.1 mmol/L   Chloride 108 101 - 111 mmol/L   BUN 53 (H) 6 - 20 mg/dL   Creatinine, Ser 2.50 (H) 0.61 - 1.24 mg/dL   Glucose, Bld 398 (H) 65 - 99 mg/dL   Calcium, Ion 1.16 1.15 - 1.40 mmol/L   TCO2 23 22 - 32 mmol/L   Hemoglobin 15.0 13.0 - 17.0 g/dL   HCT 44.0 39.0 - 52.0 %  Glucose, capillary     Status: Abnormal   Collection Time: 12/06/17  8:05 PM  Result Value Ref Range   Glucose-Capillary 272 (H) 65 - 99 mg/dL  HIV antibody (Routine Testing)     Status: None   Collection Time: 12/06/17  8:28 PM  Result Value  Ref  Range   HIV Screen 4th Generation wRfx Non Reactive Non Reactive    Comment: (NOTE) Performed At: [facility] [place], [place] [id number] Rush Farmer MD [id number] Performed at [facility], [place] [address], [place], [place] [address]   Magnesium     Status: None   Collection Time: 12/06/17  8:28 PM  Result Value Ref Range   Magnesium 2.0 1.7 - 2.4 mg/dL    Comment: Performed at [facility], [place] [address], [place], [place] [address]  TSH     Status: Abnormal   Collection Time: 12/06/17  8:28 PM  Result Value Ref Range   TSH 0.126 (L) 0.350 - 4.500 uIU/mL    Comment: Performed by a 3rd Generation assay with a functional sensitivity of <=0.01 uIU/mL. Performed at [facility], [place] [address] [place], [place] [address]   MRSA PCR Screening     Status: None   Collection Time: 12/06/17 10:00 PM  Result Value Ref Range   MRSA by PCR NEGATIVE NEGATIVE    Comment:        The GeneXpert MRSA Assay (FDA approved for NASAL specimens only), is one component of a comprehensive MRSA colonization surveillance program. It is not intended to diagnose MRSA infection nor to guide or monitor treatment for MRSA infections. Performed at [facility], [place] [address] [place], [place] [address]   Heparin level (unfractionated)     Status: Abnormal   Collection Time: 12/07/17  3:36 AM  Result Value Ref Range   Heparin Unfractionated 1.08 (H) 0.30 - 0.70 IU/mL    Comment: (NOTE) If heparin results are below expected values, and patient dosage has  been confirmed, suggest follow up testing of antithrombin III levels. Performed at [facility], [place] [address] [place], [place] [address]   Comprehensive metabolic panel     Status: Abnormal   Collection Time: 12/07/17  3:36 AM  Result Value Ref Range   Sodium 149 (H) 135 - 145 mmol/L   Potassium 3.3 (L) 3.5 - 5.1 mmol/L   Chloride 115 (H) 98 - 111 mmol/L   CO2 23 22 - 32 mmol/L    Glucose, Bld 121 (H) 70 - 99 mg/dL   BUN 53 (H) 8 - 23 mg/dL   Creatinine, Ser 2.05 (H) 0.61 - 1.24 mg/dL   Calcium 8.5 (L) 8.9 - 10.3 mg/dL   Total Protein 5.4 (L) 6.5 - 8.1 g/dL   Albumin 2.7 (L) 3.5 - 5.0 g/dL   AST 23 15 - 41 U/L   ALT 22 0 - 44 U/L   Alkaline Phosphatase 59 38 - 126 U/L   Total Bilirubin 0.6 0.3 - 1.2 mg/dL   GFR calc non Af Amer 31 (L) >60 mL/min   GFR calc Af Amer 36 (L) >60 mL/min    Comment: (NOTE) The eGFR has been calculated using the CKD EPI equation. This calculation has not been validated in all clinical situations. eGFR's persistently <60 mL/min signify possible Chronic Kidney Disease.    Anion gap 11 5 - 15    Comment: Performed at [facility] [address], [place],  [address]  CBC WITH DIFFERENTIAL     Status: Abnormal   Collection Time: 12/07/17  3:36 AM  Result Value Ref Range   WBC 4.0 4.0 - 10.5 K/uL   RBC 5.88 (H) 4.22 - 5.81 MIL/uL   Hemoglobin 13.3 13.0 - 17.0 g/dL  HCT 43.8 39.0 - 52.0 %   MCV 74.5 (L) 78.0 - 100.0 fL   MCH 22.6 (L) 26.0 - 34.0 pg   MCHC 30.4 30.0 - 36.0 g/dL   RDW 20.2 (H) 11.5 - 15.5 %   Platelets 80 (L) 150 - 400 K/uL    Comment: SPECIMEN CHECKED FOR CLOTS REPEATED TO VERIFY PLATELET COUNT CONFIRMED BY SMEAR    Neutrophils Relative % 65 %   Lymphocytes Relative 24 %   Monocytes Relative 10 %   Eosinophils Relative 0 %   Basophils Relative 1 %   Neutro Abs 2.6 1.7 - 7.7 K/uL   Lymphs Abs 1.0 0.7 - 4.0 K/uL   Monocytes Absolute 0.4 0.1 - 1.0 K/uL   Eosinophils Absolute 0.0 0.0 - 0.7 K/uL   Basophils Absolute 0.0 0.0 - 0.1 K/uL   RBC Morphology BURR CELLS     Comment: TEARDROP CELLS ELLIPTOCYTES Schistocytes present    WBC Morphology MILD LEFT SHIFT (1-5% METAS, OCC MYELO, OCC BANDS)     Comment: Performed at [facility], [address], [place], [place] [address]  Lipid panel     Status: Abnormal   Collection Time: 12/07/17  3:36 AM  Result Value Ref Range   Cholesterol 149 0 -  200 mg/dL   Triglycerides 98 <150 mg/dL   HDL 33 (L) >40 mg/dL   Total CHOL/HDL Ratio 4.5 RATIO   VLDL 20 0 - 40 mg/dL   LDL Cholesterol 96 0 - 99 mg/dL    Comment:        Total Cholesterol/HDL:CHD Risk Coronary Heart Disease Risk Table                     Men   Women  1/2 Average Risk   3.4   3.3  Average Risk       5.0   4.4  2 X Average Risk   9.6   7.1  3 X Average Risk  23.4   11.0        Use the calculated Patient Ratio above and the CHD Risk Table to determine the patient's CHD Risk.        ATP III CLASSIFICATION (LDL):  <100     mg/dL   Optimal  100-129  mg/dL   Near or Above                    Optimal  130-159  mg/dL   Borderline  160-189  mg/dL   High  >190     mg/dL   Very High Performed at [facility] [address] [place], [place] [address]   Glucose, capillary     Status: None   Collection Time: 12/07/17  7:27 AM  Result Value Ref Range   Glucose-Capillary 93 70 - 99 mg/dL  Glucose, capillary     Status: Abnormal   Collection Time: 12/07/17 11:41 AM  Result Value Ref Range   Glucose-Capillary 154 (H) 70 - 99 mg/dL  Heparin level (unfractionated)     Status: Abnormal   Collection Time: 12/07/17  1:56 PM  Result Value Ref Range   Heparin Unfractionated 0.75 (H) 0.30 - 0.70 IU/mL    Comment: (NOTE) If heparin results are below expected values, and patient dosage has  been confirmed, suggest follow up testing of antithrombin III levels. Performed at [facility], [place] [address] [place], [place] [address]   Glucose, capillary  Status: Abnormal   Collection Time: 12/07/17  3:53 PM  Result Value Ref Range   Glucose-Capillary 160 (H) 70 - 99 mg/dL  Glucose, capillary     Status: Abnormal   Collection Time: 12/07/17  8:58 PM  Result Value Ref Range   Glucose-Capillary 126 (H) 70 - 99 mg/dL  Heparin level (unfractionated)     Status: None   Collection Time: 12/07/17 11:03 PM  Result Value Ref Range   Heparin Unfractionated 0.54 0.30 -  0.70 IU/mL    Comment: (NOTE) If heparin results are below expected values, and patient dosage has  been confirmed, suggest follow up testing of antithrombin III levels. Performed at [facility], [place] [address] [place], [place] [address]   Heparin level (unfractionated)     Status: None   Collection Time: 12/08/17  5:49 AM  Result Value Ref Range   Heparin Unfractionated 0.62 0.30 - 0.70 IU/mL    Comment: (NOTE) If heparin results are below expected values, and patient dosage has  been confirmed, suggest follow up testing of antithrombin III levels. Performed at [facility], [place] [address] [place], [place] [address]   CBC     Status: Abnormal   Collection Time: 12/08/17  5:49 AM  Result Value Ref Range   WBC 3.8 (L) 4.0 - 10.5 K/uL    Comment: REPEATED TO VERIFY   RBC 5.73 4.22 - 5.81 MIL/uL   Hemoglobin 12.8 (L) 13.0 - 17.0 g/dL    Comment: REPEATED TO VERIFY   HCT 42.5 39.0 - 52.0 %   MCV 74.2 (L) 78.0 - 100.0 fL   MCH 22.3 (L) 26.0 - 34.0 pg   MCHC 30.1 30.0 - 36.0 g/dL   RDW 20.9 (H) 11.5 - 15.5 %   Platelets 92 (L) 150 - 400 K/uL    Comment: REPEATED TO VERIFY Performed at [facility] [address] [place], [place] [address]   Basic metabolic panel     Status: Abnormal   Collection Time: 12/08/17  5:49 AM  Result Value Ref Range   Sodium 144 135 - 145 mmol/L   Potassium 3.6 3.5 - 5.1 mmol/L   Chloride 111 98 - 111 mmol/L    Comment: Please note change in reference range.   CO2 25 22 - 32 mmol/L   Glucose, Bld 101 (H) 70 - 99 mg/dL    Comment: Please note change in reference range.   BUN 62 (H) 8 - 23 mg/dL    Comment: Please note change in reference range.   Creatinine, Ser 2.16 (H) 0.61 - 1.24 mg/dL   Calcium 8.3 (L) 8.9 - 10.3 mg/dL   GFR calc non Af Amer 29 (L) >60 mL/min   GFR calc Af Amer 34 (L) >60 mL/min    Comment: (NOTE) The eGFR has been calculated using the CKD EPI equation. This calculation has not been validated in all  clinical situations. eGFR's persistently <60 mL/min signify possible Chronic Kidney Disease.    Anion gap 8 5 - 15    Comment: Performed at [facility] [address] [place],  [address]  Glucose, capillary     Status: Abnormal   Collection Time: 12/08/17  7:30 AM  Result Value Ref Range   Glucose-Capillary 105 (H) 70 - 99 mg/dL    ECG   N/A  Telemetry   A-fib with CVR - Personally Reviewed  Radiology    Dg Chest 2 View  Result Date:  12/07/2017 CLINICAL DATA:  Tachycardia.  Left leg DVT.  Ex-smoker. EXAM: CHEST - 2 VIEW COMPARISON:  12/06/2017. FINDINGS: Stable enlarged cardiac silhouette and post CABG changes. Clear lungs with normal vascularity. Moderate right AC joint inferior spur formation. Thoracic spine degenerative changes. Coronary artery stent. IMPRESSION: No acute abnormality.  Stable cardiomegaly. Electronically Signed   By: Claudie Revering M.D.   On: 12/07/2017 19:45   Nm Pulmonary Perf And Vent  Result Date: 12/07/2017 CLINICAL DATA:  Left leg DVT period hypotension.  Ex-smoker. EXAM: NUCLEAR MEDICINE VENTILATION - PERFUSION LUNG SCAN TECHNIQUE: Ventilation images were obtained in multiple projections using inhaled aerosol Tc-65mDTPA. Perfusion images were obtained in multiple projections after intravenous injection of Tc-976mAA. RADIOPHARMACEUTICALS:  30.3 mCi of Tc-9955mPA aerosol inhalation and 4.3 mCi Tc99m4m IV COMPARISON:  Chest radiographs obtained today. FINDINGS: Ventilation: Normal ventilation of both lungs. Perfusion: Normal perfusion of both lungs with no perfusion defects. IMPRESSION: Normal examination.  No evidence of pulmonary embolism. Electronically Signed   By: StevClaudie Revering.   On: 12/07/2017 19:59   Dg Chest Port 1 View  Result Date: 12/06/2017 CLINICAL DATA:  Tachycardia EXAM: PORTABLE CHEST 1 VIEW COMPARISON:  08/30/2017 FINDINGS: Stable cardiomegaly with aortic atherosclerosis. Post CABG change noted with median sternotomy  sutures in place. Lungs are free of pulmonary consolidations. No pneumothorax or pleural effusion. Pulmonary edema. No acute osseous abnormality. IMPRESSION: 1. Stable cardiomegaly with post CABG change. 2. Aortic atherosclerosis. 3. No active pulmonary disease. Electronically Signed   By: DaviAshley Royalty.   On: 12/06/2017 17:42    Cardiac Studies   N/A  Assessment   Principal Problem:   A-fib (with RVR) HCC) Active Problems:   Hypertension   Diabetes mellitus (HCC)   Chronic combined systolic and diastolic heart failure, NYHA class 2 (HCC)   Coronary artery disease involving coronary bypass graft of native heart with angina pectoris (HCC)   Postural dizziness with presyncope   DVT (deep venous thrombosis) (HCC)   AKI (acute kidney injury) (HCC)[facility]   1. Mr. NeesNestor rate-controlled a-fib on amiodarone. Will transition to oral amiodarone today. On IV heparin. D/w Dr. HongAlgis Liminge will touch base with oncology to discussion long-term anticoagulation options. ?IVC filter given extensive proximal thrombus, especially if not felt to be an anticoagulation candidate. If anticoagulation is feasible, then would recommend DOAC for ease of onset/offset, especially since he is facing esophageal surgery.  Time Spent Directly with Patient:  I have spent a total of 25 minutes with the patient reviewing hospital notes, telemetry, EKGs, labs and examining the patient as well as establishing an assessment and plan that was discussed personally with the patient. > 50% of time was spent in direct patient care.  Length of Stay:  LOS: 2 days   KennPixie Casino, FACCPermian Basin Surgical Care CenterCPKenilworthector of the [facility] Lipid [facility]  Cardiovascular Risk Reduction [facility] Diplomate of the American Board of Clinical Lipidology Attending Cardiologist  Direct Dial: [phone]: [phone]:  [email or web address] Jonetta Osgoodty 12/08/2017, 8:57 AM

## 2017-12-08 NOTE — Evaluation (Signed)
Clinical/Bedside Swallow Evaluation Patient Details  Name: Justin Blackburn. MRN: [id number] Date of Birth: 1951/01/20  Today's Date: 12/08/2017 Time: SLP Start Time (ACUTE ONLY): 1133 SLP Stop Time (ACUTE ONLY): 1154 SLP Time Calculation (min) (ACUTE ONLY): 21 min  Past Medical History:  Past Medical History:  Diagnosis Date  . Angina   . Arthritis    "hands" (08/10/2017)  . Atrial flutter, paroxysmal ([place])    arrived with aflutter with RVR, EF down to 25-30%, s/p TEE DCCV on 01/29/2015  . Chronic lower back pain    "all the time from my sciatic nerve"  . Coronary artery disease   . Erectile dysfunction   . GERD (gastroesophageal reflux disease)   . Heart failure ([place])   . Hypercholesteremia   . Hypertension   . 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 Myocardial infarction ([place]) 1988; ~5809;  ~1996;~ 2000  . Obesity   . Peripheral vascular disease ([place])   . Umbilical hernia    unrepaired   Past Surgical History:  Past Surgical History:  Procedure Laterality Date  . CARDIOVERSION N/A 01/29/2015   Procedure: CARDIOVERSION;  Surgeon: [name], MD;  Location: [facility];  Service: Cardiovascular;  Laterality: N/A;  . CORONARY ANGIOPLASTY WITH STENT PLACEMENT  08/31/11   "2 today; makes total of 3 stents"  . CORONARY ANGIOPLASTY WITH STENT PLACEMENT  08/10/2017   "2 today; makes a total of 5 stents" (08/10/2017)  . CORONARY ARTERY BYPASS GRAFT  1998; 2007   CABG X 4; CABG X4  . CORONARY STENT INTERVENTION N/A 08/10/2017   Procedure: CORONARY STENT INTERVENTION;  Surgeon: Sherren Mocha, MD;  Location: [facility];  Service: Cardiovascular;  Laterality: N/A;  . ESOPHAGOGASTRODUODENOSCOPY (EGD) WITH PROPOFOL N/A 09/01/2017   Procedure: ESOPHAGOGASTRODUODENOSCOPY (EGD) WITH PROPOFOL;  Surgeon: Laurence Spates, MD;  Location: [facility];  Service: Endoscopy;  Laterality: N/A;  . EUS N/A 09/29/2017   Procedure: UPPER ENDOSCOPIC ULTRASOUND (EUS)  LINEAR;  Surgeon: Arta Silence, MD;  Location: [facility];  Service: Endoscopy;  Laterality: N/A;  . LEFT HEART CATH AND CORS/GRAFTS ANGIOGRAPHY N/A 08/10/2017   Procedure: LEFT HEART CATH AND CORS/GRAFTS ANGIOGRAPHY;  Surgeon: Sherren Mocha, MD;  Location: [facility];  Service: Cardiovascular;  Laterality: N/A;  . LEFT HEART CATHETERIZATION WITH CORONARY/GRAFT ANGIOGRAM N/A 08/31/2011   Procedure: LEFT HEART CATHETERIZATION WITH Beatrix Fetters;  Surgeon: [name], MD;  Location: [facility] LAB;  Service: Cardiovascular;  Laterality: N/A;  . PERIPHERAL VASCULAR CATHETERIZATION N/A 04/27/2016   Procedure: Abdominal Aortogram w/Lower Extremity;  Surgeon: Angelia Mould, MD;  Location: [facility];  Service: Cardiovascular;  Laterality: N/A;  . TEE WITHOUT CARDIOVERSION N/A 01/29/2015   Procedure: TRANSESOPHAGEAL ECHOCARDIOGRAM (TEE);  Surgeon: [name], MD;  Location: [facility];  Service: Cardiovascular;  Laterality: N/A;   HPI:  67 yo with a history of coronary artery disease status post remote CABG, miltiple PCIs, chronic systolic and diastolic heart failure (most recent EF 45 to 50%), PAF, GI bleed, esophageal cancer undergoing radiation and awaiting esophagectomy (when stable from cardiac standpoint per wife- sounded as if may not be in near future?), GERD who presents with shortness of breath and palpitations, found to have atrial fibrillation with RVR and concern for left lower extremity DVT/possible PE. CXR no acute abnormality. Stable cardiomegaly.   Assessment / Plan / Recommendation Clinical Impression  Pt and wife reports decreased intake with significant weight loss due to pharyngeal odonophagia which  pt states "is improving". Pt s/p recent completion of radiation to esophagus. Mostly drinking liquids with applesauce with meds. Could not appreciate soft palate, pharyngopalatine arch or fauces ulceration or irritation (likely lower  pharyngeal). Oral motor movements and strength intact. No s/s aspiration with thin and soft textures. Pt currently on puree diet and educated on benefits of various available textures. Pt and therapist agreed on regular texture with pt/family ordering food he deisres as well as ease of transit to esophagus. Educated on reflux/esophageal precautions and ST will follow up once more.  SLP Visit Diagnosis: Dysphagia, unspecified (R13.10)    Aspiration Risk  Mild aspiration risk    Diet Recommendation Regular;Thin liquid   Liquid Administration via: Cup;Straw Medication Administration: Whole meds with liquid Supervision: Patient able to self feed Compensations: Slow rate;Small sips/bites;Follow solids with liquid Postural Changes: Seated upright at 90 degrees;Remain upright for at least 30 minutes after po intake    Other  Recommendations Oral Care Recommendations: Oral care BID   Follow up Recommendations None      Frequency and Duration min 1 x/week  2 weeks       Prognosis Prognosis for Safe Diet Advancement: (fair-good)      Swallow Study   General HPI: 67 yo with a history of coronary artery disease status post remote CABG, miltiple PCIs, chronic systolic and diastolic heart failure (most recent EF 45 to 50%), PAF, GI bleed, esophageal cancer undergoing radiation and awaiting esophagectomy (when stable from cardiac standpoint per wife- sounded as if may not be in near future?), GERD who presents with shortness of breath and palpitations, found to have atrial fibrillation with RVR and concern for left lower extremity DVT/possible PE. CXR no acute abnormality. Stable cardiomegaly. Type of Study: Bedside Swallow Evaluation Previous Swallow Assessment: (none) Diet Prior to this Study: Dysphagia 1 (puree);Thin liquids Temperature Spikes Noted: No Respiratory Status: Room air History of Recent Intubation: No Behavior/Cognition: Alert;Cooperative Oral Cavity Assessment: Within  Functional Limits Oral Care Completed by SLP: No Oral Cavity - Dentition: Adequate natural dentition Vision: Functional for self-feeding Self-Feeding Abilities: Able to feed self Patient Positioning: Upright in bed Baseline Vocal Quality: Normal Volitional Cough: (moderately weak) Volitional Swallow: Able to elicit    Oral/Motor/Sensory Function Overall Oral Motor/Sensory Function: Within functional limits   Ice Chips Ice chips: Not tested   Thin Liquid Thin Liquid: Within functional limits Presentation: Cup;Straw    Nectar Thick Nectar Thick Liquid: Not tested   Honey Thick Honey Thick Liquid: Not tested   Puree Puree: Within functional limits   Solid   GO   Solid: Not tested        Houston Siren 12/08/2017,12:20 PM    Orbie Pyo Colvin Caroli.Ed [employer] [phone]

## 2017-12-08 NOTE — Progress Notes (Signed)
ANTICOAGULATION CONSULT NOTE - Follow Up Consult  Pharmacy Consult for Heparin  Indication: atrial fibrillation and DVT   No Known Allergies  Patient Measurements: Height: 6' (182.9 cm) Weight: 193 lb 9 oz (87.8 kg) IBW/kg (Calculated) : 77.6  Vital Signs: Temp: 98 F (36.7 C) (06/26 0500) Temp Source: Oral (06/26 0500) BP: 93/73 (06/26 0500) Pulse Rate: 120 (06/26 0500)  Labs: Recent Labs    12/06/17 1731 12/06/17 1756  12/07/17 0336 12/07/17 1356 12/07/17 2303 12/08/17 0549  HGB 14.0 15.0  --  13.3  --   --  12.8*  HCT 47.4 44.0  --  43.8  --   --  42.5  PLT PLATELET CLUMPS NOTED ON SMEAR, UNABLE TO ESTIMATE  --   --  80*  --   --  92*  HEPARINUNFRC  --   --    < > 1.08* 0.75* 0.54 0.62  CREATININE 2.65* 2.50*  --  2.05*  --   --  2.16*   < > = values in this interval not displayed.   Estimated Creatinine Clearance: 35.4 mL/min (A) (by C-G formula based on SCr of 2.16 mg/dL (H)).  Assessment: 68 y/o M on heparin for afib and now with confirmed DVT. V/Q scan negative for PE.  Heparin level now therapeutic twice after rate decrease yesterday. HgB stable 12.8, PLT low 92 but stable. No overt bleeding or infusion related issues.    Goal of Therapy:  Heparin level 0.3-0.7 units/ml Monitor platelets by anticoagulation protocol: Yes   Plan:  -Continue heparin gtt at 1200 units/hr  -Daily heparin level and CBC -Monitor for s/sx of bleeding  Taygen Newsome L Marsden Zaino 12/08/2017,8:45 AM

## 2017-12-08 NOTE — Consult Note (Signed)
   [facility] CM Inpatient Consult   12/08/2017  Justin Blackburn 1950/09/23 [id number]     Patient screened for potential [facility] Care Management services due to increased risk of unplanned readmission score of 29% (high).  Went to bedside to speak with [name] about [place] Management program. However, he was sleeping soundly.   Will follow up at a later time.  Made inpatient RNCM aware of attempted bedside visit.   Marthenia Rolling, MSN-Ed, RN,BSN [facility] Liaison [phone]

## 2017-12-09 LAB — BASIC METABOLIC PANEL
Anion gap: 10 (ref 5–15)
BUN: 55 mg/dL — AB (ref 8–23)
CALCIUM: 7.7 mg/dL — AB (ref 8.9–10.3)
CO2: 18 mmol/L — ABNORMAL LOW (ref 22–32)
CREATININE: 1.48 mg/dL — AB (ref 0.61–1.24)
Chloride: 110 mmol/L (ref 98–111)
GFR calc Af Amer: 54 mL/min — ABNORMAL LOW (ref >60)
GFR, EST NON AFRICAN AMERICAN: 47 mL/min — AB (ref >60)
GLUCOSE: 112 mg/dL — AB (ref 70–99)
Potassium: 3.8 mmol/L (ref 3.5–5.1)
Sodium: 138 mmol/L (ref 135–145)

## 2017-12-09 LAB — HEPARIN LEVEL (UNFRACTIONATED): Heparin Unfractionated: 0.37 IU/mL (ref 0.30–0.70)

## 2017-12-09 LAB — GLUCOSE, CAPILLARY
GLUCOSE-CAPILLARY: 94 mg/dL (ref 70–99)
Glucose-Capillary: 155 mg/dL — ABNORMAL HIGH (ref 70–99)
Glucose-Capillary: 89 mg/dL (ref 70–99)
Glucose-Capillary: 121 mg/dL — ABNORMAL HIGH (ref 70–99)

## 2017-12-09 LAB — CBC
HCT: 41.4 % (ref 39.0–52.0)
Hemoglobin: 12.6 g/dL — ABNORMAL LOW (ref 13.0–17.0)
MCH: 22.4 pg — AB (ref 26.0–34.0)
MCHC: 30.4 g/dL (ref 30.0–36.0)
MCV: 73.5 fL — AB (ref 78.0–100.0)
Platelets: 97 10*3/uL — ABNORMAL LOW (ref 150–400)
RBC: 5.63 MIL/uL (ref 4.22–5.81)
RDW: 21.2 % — AB (ref 11.5–15.5)
WBC: 3.4 10*3/uL — AB (ref 4.0–10.5)

## 2017-12-09 MED ORDER — APIXABAN 5 MG PO TABS
10.0000 mg | ORAL_TABLET | Freq: Two times a day (BID) | ORAL | Status: DC
  Administered 2017-12-09 – 2017-12-10 (×3): 10 mg via ORAL
  Filled 2017-12-09 (×3): qty 2

## 2017-12-09 MED ORDER — CARVEDILOL 3.125 MG PO TABS
3.1250 mg | ORAL_TABLET | Freq: Two times a day (BID) | ORAL | Status: DC
  Administered 2017-12-09 – 2017-12-10 (×3): 3.125 mg via ORAL
  Filled 2017-12-09 (×3): qty 1

## 2017-12-09 MED ORDER — APIXABAN 5 MG PO TABS
5.0000 mg | ORAL_TABLET | Freq: Two times a day (BID) | ORAL | Status: DC
Start: 2017-12-16 — End: 2017-12-10

## 2017-12-09 NOTE — Progress Notes (Signed)
  Speech Language Pathology Treatment: Dysphagia  Patient Details Name: Justin Blackburn. MRN: [id number] DOB: 1951-02-04 Today's Date: 12/09/2017 Time: 1010-1020 SLP Time Calculation (min) (ACUTE ONLY): 10 min  Assessment / Plan / Recommendation Clinical Impression  Pt observed with Dys 3/regular texture for tolerance given recent odonophagia. Pt reported no problems with dinner or breakfast. Observed with peaches and thin water without s/s aspiration. Pt able to state esophageal precautions with mild prompts. Explained if esophagectomy is performed he will need to use additional caution given location of stomach and increased risk of reflux and possible aspiration. Continue regular texture with pt selecting softer textures, thin liquids. No further ST needed.    HPI HPI: 67 yo with a history of coronary artery disease status post remote CABG, miltiple PCIs, chronic systolic and diastolic heart failure (most recent EF 45 to 50%), PAF, GI bleed, esophageal cancer undergoing radiation and awaiting esophagectomy (when stable from cardiac standpoint per wife- sounded as if may not be in near future?), GERD who presents with shortness of breath and palpitations, found to have atrial fibrillation with RVR and concern for left lower extremity DVT/possible PE. CXR no acute abnormality. Stable cardiomegaly.      SLP Plan  All goals met;Discharge SLP treatment due to (comment)       Recommendations  Diet recommendations: Regular;Thin liquid Liquids provided via: Cup;Straw Medication Administration: Whole meds with liquid Supervision: Patient able to self feed Compensations: Slow rate;Small sips/bites;Follow solids with liquid Postural Changes and/or Swallow Maneuvers: Seated upright 90 degrees;Upright 30-60 min after meal                Oral Care Recommendations: Oral care BID Follow up Recommendations: None SLP Visit Diagnosis: Dysphagia, unspecified (R13.10) Plan: All goals met;Discharge  SLP treatment due to (comment)       GO                Houston Siren 12/09/2017, 11:45 AM

## 2017-12-09 NOTE — Progress Notes (Signed)
ANTICOAGULATION CONSULT NOTE - Follow Up Consult  Pharmacy Consult for Eliquis Indication: atrial fibrillation and DVT   No Known Allergies  Patient Measurements: Height: 6' (182.9 cm) Weight: 198 lb 10.2 oz (90.1 kg) IBW/kg (Calculated) : 77.6  Vital Signs: Temp: 98.7 F (37.1 C) (06/27 0733) Temp Source: Oral (06/27 0733) BP: 133/94 (06/27 0733) Pulse Rate: 84 (06/27 0733)  Labs: Recent Labs    12/07/17 0336  12/07/17 2303 12/08/17 0549 12/09/17 0515  HGB 13.3  --   --  12.8* 12.6*  HCT 43.8  --   --  42.5 41.4  PLT 80*  --   --  92* 97*  HEPARINUNFRC 1.08*   < > 0.54 0.62 0.37  CREATININE 2.05*  --   --  2.16* 1.48*   < > = values in this interval not displayed.   Estimated Creatinine Clearance: 51.7 mL/min (A) (by C-G formula based on SCr of 1.48 mg/dL (H)).  Assessment: 67 y/o M with afib and now with confirmed DVT. V/Q scan negative for PE.  Heparin level has been therapeutic, now transitioning to oral anticoagulation. HgB stable 12.6, PLT low 97 but stable. No overt bleeding or infusion related issues.    Goal of Therapy:  Heparin level 0.3-0.7 units/ml Monitor platelets by anticoagulation protocol: Yes   Plan:  -D/C heparin gtt -Eliquis 10mg  BID x7 days then 5mg  BID -Monitor renal function and for s/sx of bleeding  Alaila Pillard L Elodie Panameno 12/09/2017,9:51 AM

## 2017-12-09 NOTE — Progress Notes (Signed)
PROGRESS NOTE    Justin Blackburn.  MVH:[id number] DOB: 08-25-1950 DOA: 12/06/2017 PCP: [facility], [facility]   Brief Narrative: Patient is a 67 year old male with past medical history of coronary disease, A. fib not on anticoagulation due to history of GI bleed , history of esophageal cancer s/p XRT, hypertension, dysphagia following radiation therapy who presented to the emergency department with complaints of generalized weakness, palpitation and left lower extremity swelling.  Patient was found to be in A. fib with RVR in the emergency department.  Started on Cardizem drip.  Cardiology consulted.  Patient also has severe left lower extremity edema.  Venous Doppler positive for DVT.  Now rate controlled.  Transition to oral amiodarone.  IV heparin transitioned to oral Eliquis.  Improving.  Possible DC home 6/28.  Assessment & Plan:   Principal Problem:   A-fib (with RVR) HCC) Active Problems:   Hypertension   Diabetes mellitus (HCC)   Chronic combined systolic and diastolic heart failure, NYHA class 2 (HCC)   Coronary artery disease involving coronary bypass graft of native heart with angina pectoris (HCC)   Postural dizziness with presyncope   DVT (deep venous thrombosis) (HCC)   AKI (acute kidney injury) ([place])  A. fib with RVR: Patient became hypotensive on Cardizem drip 6/25.  He was started on amiodarone drip.  Continued IV heparin for anticoagulation. Cardiology following.  Now rate controlled.  Transitioned to oral amiodarone load.  I discussed in detail with Dr. Alen Blew, patient's primary oncologist on 6/26 who sees no contraindication to resuming Eliquis.  Discussed with Dr. Debara Pickett: Low-dose carvedilol 3.125 mg twice daily added to amiodarone, IV heparin switched to oral Lasix for acute DVT and A. fib, may consider outpatient DCCV if he remains in A. fib after 3 weeks of uninterrupted anticoagulation and appropriate amiodarone load, aspirin discontinued to avoid bleeding risk  but Plavix continued (recent PCI 07/2017) in addition to newly started Eliquis.  Left lower extremity DVT: Lower extremity venous Doppler confirmed acute deep vein thrombosis in the common femoral vein, femoral vein , popliteal vein and calf veins and also the left external iliac vein. VQ scan negative for PE. I discussed with PCCM on 6/26 who indicated that unless patient not a candidate for anticoagulation or if patient had mobile clot, would not recommend IVC filter. As discussed above, no absolute contraindications to oral anticoagulation. Initially on IV heparin infusion which was transitioned to Eliquis 6/27.  Elevate extremity.  History of esophageal cancer: Follows with Oncology as an outpatient.  Patient was just on pured diet due to dysphagia secondary to radiation therapy.  He is being planned for esophagectomy.  He has poor oral intake.  Discussed with speech therapy and their input appreciated.  They recommend regular diet and thin liquids. Oral intake improving per his report.  Remains on PPI and sucralfate.  History of hypertension: Was hypotensive because of Cardizem drip.  Hypotension resolved.  Blood pressures currently controlled.  Carvedilol started 6/27.  History of coronary disease: Status post CABG x2. S/P PCI. Denies any chest pain at present.  On aspirin,Plavix at home.Continue statin.  As indicated above, aspirin discontinued.  Continue Plavix and newly started Eliquis for DVT/A. fib.  Acute kidney injury/hypernatremia: Found to be severely dehydrated on presentation.  Started on IV fluids.  Currently on half-normal saline secondary to hypernatremia.  Creatinine has improved from 2.18-1.4 range.  Seems adequately hydrated and may be even becoming slightly volume overloaded.  Discontinue IV fluids.  Follow BMP in  a.m.  Hypokalemia: Replaced.  History of chronic systolics and diastolic heart failure: Most recent ejection fraction is 40 to 45%.  On Lasix,cande sartan at home  which are on hold due to acute kidney injury.  No overt features of CHF except left lower extremity edema secondary to acute DVT.  Consider resuming Lasix at discharge.  Diabetes mellitus: Continue to monitor blood sugars.  Currently on Lantus, SSI and linagliptin .  Reasonable inpatient control.  Thrombocytopenia: Unclear etiology.  Had platelet count of 101 on 6/13.?  Related to radiation.  Slowly improving/97 on 6/27.  She actually has pancytopenia with slightly low white cell count and hemoglobin.  Continue to follow CBCs.  Low TSH: 0.126.  Recommend repeating in 4 to 6 weeks.  Clinically euthyroid.   DVT prophylaxis: IV heparin transition to Eliquis on 6/27 Code Status: Full Family Communication: None at bedside today. Disposition Plan: Monitor overnight and pending clinical stability, likely discharge home 6/28.   Consultants: Cardiology  Procedures: None  Antimicrobials: None  Subjective: Denies complaints.  No chest pain, dyspnea or palpitations.  Persisting left leg swelling without pain.  As per RN, no acute issues noted.  Objective: Vitals:   12/08/17 2029 12/09/17 0500 12/09/17 0733 12/09/17 1123  BP: 134/63 135/74 (!) 133/94 106/66  Pulse: 76 93 84 71  Resp: 20 (!) 25 20   Temp: 97.6 F (36.4 C) 98 F (36.7 C) 98.7 F (37.1 C) 97.8 F (36.6 C)  TempSrc: Oral Oral Oral Oral  SpO2: 96% 98% 95% 97%  Weight:  90.1 kg (198 lb 10.2 oz)    Height:        Intake/Output Summary (Last 24 hours) at 12/09/2017 1324 Last data filed at 12/09/2017 1000 Gross per 24 hour  Intake 2657.05 ml  Output 1071 ml  Net 1586.05 ml   Filed Weights   12/07/17 0632 12/08/17 0500 12/09/17 0500  Weight: 86.8 kg (191 lb 5.8 oz) 87.8 kg (193 lb 9 oz) 90.1 kg (198 lb 10.2 oz)    Examination:  General exam: Pleasant middle-aged male, moderately built and nourished, sitting up comfortably in bed. Respiratory system: Slightly diminished breath sounds in the bases but otherwise clear to  auscultation.  No increased work of breathing. Cardiovascular system: S1 and S2 heard, irregularly irregular.  No JVD or murmurs.  Left lower extremity 2+ pitting edema.  No right lower extremity edema.  Telemetry personally reviewed: A. fib with ventricular rate in the 90s-100s.?  Episodes of NSVT versus A. fib with aberrancy. Gastrointestinal system: Abdomen is nondistended, soft and nontender. No organomegaly or masses felt. Normal bowel sounds heard.  Stable Central nervous system: Alert and oriented. No focal neurological deficits.  Stable Extremities: Severe edema on the left lower extremity without any other acute findings, no clubbing ,no cyanosis, distal peripheral pulses palpable.  Edema may be somewhat less today than yesterday. Skin: No rashes, lesions or ulcers,no icterus ,no pallor MSK: Normal muscle bulk,tone ,power Psychiatry: Judgement and insight appear normal. Mood & affect appropriate.     Data Reviewed: I have personally reviewed following labs and imaging studies  CBC: Recent Labs  Lab 12/06/17 1731 12/06/17 1756 12/07/17 0336 12/08/17 0549 12/09/17 0515  WBC 3.8*  --  4.0 3.8* 3.4*  NEUTROABS 2.8  --  2.6  --   --   HGB 14.0 15.0 13.3 12.8* 12.6*  HCT 47.4 44.0 43.8 42.5 41.4  MCV 74.8*  --  74.5* 74.2* 73.5*  PLT PLATELET CLUMPS NOTED ON SMEAR,  UNABLE TO ESTIMATE  --  80* 92* 97*   Basic Metabolic Panel: Recent Labs  Lab 12/06/17 1731 12/06/17 1756 12/06/17 2028 12/07/17 0336 12/08/17 0549 12/09/17 0515  NA 145 145  --  149* 144 138  K 3.4* 3.5  --  3.3* 3.6 3.8  CL 105 108  --  115* 111 110  CO2 23  --   --  23 25 18*  GLUCOSE 394* 398*  --  121* 101* 112*  BUN 57* 53*  --  53* 62* 55*  CREATININE 2.65* 2.50*  --  2.05* 2.16* 1.48*  CALCIUM 8.9  --   --  8.5* 8.3* 7.7*  MG  --   --  2.0  --   --   --    GFR: Estimated Creatinine Clearance: 51.7 mL/min (A) (by C-G formula based on SCr of 1.48 mg/dL (H)). Liver Function Tests: Recent Labs  Lab  12/07/17 0336  AST 23  ALT 22  ALKPHOS 59  BILITOT 0.6  PROT 5.4*  ALBUMIN 2.7*   CBG: Recent Labs  Lab 12/08/17 1202 12/08/17 1613 12/08/17 2118 12/09/17 0731 12/09/17 1122  GLUCAP 164* 147* 163* 94 155*   Lipid Profile: Recent Labs    12/07/17 0336  CHOL 149  HDL 33*  LDLCALC 96  TRIG 98  CHOLHDL 4.5   Thyroid Function Tests: Recent Labs    12/06/17 2028  TSH 0.126*    Recent Results (from the past 240 hour(s))  MRSA PCR Screening     Status: None   Collection Time: 12/06/17 10:00 PM  Result Value Ref Range Status   MRSA by PCR NEGATIVE NEGATIVE Final    Comment:        The GeneXpert MRSA Assay (FDA approved for NASAL specimens only), is one component of a comprehensive MRSA colonization surveillance program. It is not intended to diagnose MRSA infection nor to guide or monitor treatment for MRSA infections. Performed at [facility], [place] [address], [place], [place] [address]          Radiology Studies: Dg Chest 2 View  Result Date: 12/07/2017 CLINICAL DATA:  Tachycardia.  Left leg DVT.  Ex-smoker. EXAM: CHEST - 2 VIEW COMPARISON:  12/06/2017. FINDINGS: Stable enlarged cardiac silhouette and post CABG changes. Clear lungs with normal vascularity. Moderate right AC joint inferior spur formation. Thoracic spine degenerative changes. Coronary artery stent. IMPRESSION: No acute abnormality.  Stable cardiomegaly. Electronically Signed   By: Claudie Revering M.D.   On: 12/07/2017 19:45   Nm Pulmonary Perf And Vent  Result Date: 12/07/2017 CLINICAL DATA:  Left leg DVT period hypotension.  Ex-smoker. EXAM: NUCLEAR MEDICINE VENTILATION - PERFUSION LUNG SCAN TECHNIQUE: Ventilation images were obtained in multiple projections using inhaled aerosol Tc-53m DTPA. Perfusion images were obtained in multiple projections after intravenous injection of Tc-51m-MAA. RADIOPHARMACEUTICALS:  30.3 mCi of Tc-12m DTPA aerosol inhalation and 4.3 mCi Tc59m-MAA IV COMPARISON:   Chest radiographs obtained today. FINDINGS: Ventilation: Normal ventilation of both lungs. Perfusion: Normal perfusion of both lungs with no perfusion defects. IMPRESSION: Normal examination.  No evidence of pulmonary embolism. Electronically Signed   By: Claudie Revering M.D.   On: 12/07/2017 19:59        Scheduled Meds: . amiodarone  400 mg Oral BID  . apixaban  10 mg Oral BID   Followed by  . [START ON 12/16/2017] apixaban  5 mg Oral BID  . atorvastatin  40 mg Oral q1800  . carvedilol  3.125 mg  Oral BID WC  . clopidogrel  75 mg Oral Daily  . ferrous sulfate  325 mg Oral Daily  . gabapentin  300 mg Oral BID  . insulin aspart  0-15 Units Subcutaneous TID WC  . insulin glargine  25 Units Subcutaneous Daily  . linagliptin  5 mg Oral Daily  . pantoprazole  40 mg Oral Daily  . sucralfate  1 g Oral TID WC & HS   Continuous Infusions:    LOS: 3 days    Time spent: More than 50% of that time was spent in counseling and/or coordination of care.      Vernell Leep, MD, FACP, [facility]. [facility] Pager [phone]  If 7PM-7AM, please contact night-coverage [email or web address] Password [email or web address] 12/09/2017, 1:24 PM

## 2017-12-09 NOTE — Care Management (Addendum)
Pt presented for Atrial Fib- PTA independent from home. Patient states he has been on Eliquis before. CM did provide patient with 30 day free card. Pt will need a paper Rx for Eliquis.  Patient is aware that card may not work if he has utilized in the past.   Benefits Check completed for Eliquis. CM did call [facility] to see if starter pack is in stock-medication is not available. [facility] [place] has medication available. Patient is aware. No further needs from CM at this time.        S/W TAMARA @ [employer] [facility] # [phone] OPT- 2   ELIQUIS 10 MG - NONE FORMULARY   1.ELIQUIS 5 MG BID  COVER- YES  CO-PAY- $ 8.50  TIER- 3 DRUG  PRIOR APPROVAL- NO   2 ELIQUIS 2.5 MG BID  COVER-YES  CO-PAY- $ 8.50  TIER- 3 DRUG  PRIOR APPROVAL- NO   PREFERRED PHARMACY : YES [facility], [facility] AND [facility]

## 2017-12-09 NOTE — Care Management Important Message (Signed)
Important Message  Patient Details  Name: Justin Blackburn. MRN: [id number] Date of Birth: 1950-07-26   Medicare Important Message Given:  Yes    Delorse Lek 12/09/2017, 2:31 PM

## 2017-12-09 NOTE — Progress Notes (Signed)
DAILY PROGRESS NOTE   Patient Name: Justin Blackburn. Date of Encounter: 12/09/2017  Chief Complaint   No complaints today  Patient Profile   51M with a history of coronary artery disease status post remote CABG and multiple PCIs, chronic systolic and diastolic heart failure (most recent EF 45 to 50%), PAF not on anticoagulation due to GI bleeding, esophageal cancer undergoing radiation and awaiting esophagectomy, who presents with shortness of breath and palpitations, found to have atrial fibrillation with RVR and concern for left lower extremity DVT/possible PE  Subjective   Feels better today - now rate controlled on amiodarone gtts. VQ scan was negative for PE. He does have extensive thrombus of the LLE up to the common femoral vein.  Objective   Vitals:   12/08/17 1615 12/08/17 2029 12/09/17 0500 12/09/17 0733  BP:  134/63 135/74 (!) 133/94  Pulse:  76 93 84  Resp:  20 (!) 25 20  Temp: 98.6 F (37 C) 97.6 F (36.4 C) 98 F (36.7 C) 98.7 F (37.1 C)  TempSrc:  Oral Oral Oral  SpO2:  96% 98% 95%  Weight:   198 lb 10.2 oz (90.1 kg)   Height:        Intake/Output Summary (Last 24 hours) at 12/09/2017 0857 Last data filed at 12/09/2017 0542 Gross per 24 hour  Intake 3159.08 ml  Output 1071 ml  Net 2088.08 ml   Filed Weights   12/07/17 0632 12/08/17 0500 12/09/17 0500  Weight: 191 lb 5.8 oz (86.8 kg) 193 lb 9 oz (87.8 kg) 198 lb 10.2 oz (90.1 kg)    Physical Exam   General appearance: alert, pale and no distress, comfortable Neck: no carotid bruit, no JVD and thyroid not enlarged, symmetric, no tenderness/mass/nodules Lungs: diminished breath sounds bilaterally Heart: irregularly irregular rhythm Abdomen: soft, non-tender; bowel sounds normal; no masses,  no organomegaly and mildly protuberant Extremities: edema 2+ LLE edema, trace RLE edema Pulses: 2+ and symmetric Skin: pale, warm, dry Neurologic: Mental status: Alert, oriented, thought content  appropriate Psych: Pleasant  Inpatient Medications    Scheduled Meds: . amiodarone  400 mg Oral BID  . aspirin EC  81 mg Oral Daily  . atorvastatin  40 mg Oral q1800  . clopidogrel  75 mg Oral Daily  . ferrous sulfate  325 mg Oral Daily  . gabapentin  300 mg Oral BID  . insulin aspart  0-15 Units Subcutaneous TID WC  . insulin glargine  25 Units Subcutaneous Daily  . linagliptin  5 mg Oral Daily  . pantoprazole  40 mg Oral Daily  . sucralfate  1 g Oral TID WC & HS    Continuous Infusions: . sodium chloride 75 mL/hr at 12/09/17 0542  . heparin 1,200 Units/hr (12/09/17 0542)    PRN Meds: acetaminophen, metoprolol tartrate, nitroGLYCERIN, ondansetron (ZOFRAN) IV, prochlorperazine   Labs   Results for orders placed or performed during the hospital encounter of 12/06/17 (from the past 48 hour(s))  Glucose, capillary     Status: Abnormal   Collection Time: 12/07/17 11:41 AM  Result Value Ref Range   Glucose-Capillary 154 (H) 70 - 99 mg/dL  Heparin level (unfractionated)     Status: Abnormal   Collection Time: 12/07/17  1:56 PM  Result Value Ref Range   Heparin Unfractionated 0.75 (H) 0.30 - 0.70 IU/mL    Comment: (NOTE) If heparin results are below expected values, and patient dosage has  been confirmed, suggest follow up testing of antithrombin III  levels. Performed at [facility], [place] [address], [place], [place] [address]   Glucose, capillary     Status: Abnormal   Collection Time: 12/07/17  3:53 PM  Result Value Ref Range   Glucose-Capillary 160 (H) 70 - 99 mg/dL  Glucose, capillary     Status: Abnormal   Collection Time: 12/07/17  8:58 PM  Result Value Ref Range   Glucose-Capillary 126 (H) 70 - 99 mg/dL  Heparin level (unfractionated)     Status: None   Collection Time: 12/07/17 11:03 PM  Result Value Ref Range   Heparin Unfractionated 0.54 0.30 - 0.70 IU/mL    Comment: (NOTE) If heparin results are below expected values, and patient dosage has  been  confirmed, suggest follow up testing of antithrombin III levels. Performed at [facility], [place] [address], [place], [place] [address]   Heparin level (unfractionated)     Status: None   Collection Time: 12/08/17  5:49 AM  Result Value Ref Range   Heparin Unfractionated 0.62 0.30 - 0.70 IU/mL    Comment: (NOTE) If heparin results are below expected values, and patient dosage has  been confirmed, suggest follow up testing of antithrombin III levels. Performed at [facility], [place] [address], [place], [place] [address]   CBC     Status: Abnormal   Collection Time: 12/08/17  5:49 AM  Result Value Ref Range   WBC 3.8 (L) 4.0 - 10.5 K/uL    Comment: REPEATED TO VERIFY   RBC 5.73 4.22 - 5.81 MIL/uL   Hemoglobin 12.8 (L) 13.0 - 17.0 g/dL    Comment: REPEATED TO VERIFY   HCT 42.5 39.0 - 52.0 %   MCV 74.2 (L) 78.0 - 100.0 fL   MCH 22.3 (L) 26.0 - 34.0 pg   MCHC 30.1 30.0 - 36.0 g/dL   RDW 20.9 (H) 11.5 - 15.5 %   Platelets 92 (L) 150 - 400 K/uL    Comment: REPEATED TO VERIFY Performed at [facility] [address], [place],  [address]   Basic metabolic panel     Status: Abnormal   Collection Time: 12/08/17  5:49 AM  Result Value Ref Range   Sodium 144 135 - 145 mmol/L   Potassium 3.6 3.5 - 5.1 mmol/L   Chloride 111 98 - 111 mmol/L    Comment: Please note change in reference range.   CO2 25 22 - 32 mmol/L   Glucose, Bld 101 (H) 70 - 99 mg/dL    Comment: Please note change in reference range.   BUN 62 (H) 8 - 23 mg/dL    Comment: Please note change in reference range.   Creatinine, Ser 2.16 (H) 0.61 - 1.24 mg/dL   Calcium 8.3 (L) 8.9 - 10.3 mg/dL   GFR calc non Af Amer 29 (L) >60 mL/min   GFR calc Af Amer 34 (L) >60 mL/min    Comment: (NOTE) The eGFR has been calculated using the CKD EPI equation. This calculation has not been validated in all clinical situations. eGFR's persistently <60 mL/min signify possible Chronic Kidney Disease.    Anion gap  8 5 - 15    Comment: Performed at [facility] [address], [place], [place] [address]  Glucose, capillary     Status: Abnormal   Collection Time: 12/08/17  7:30 AM  Result Value Ref Range   Glucose-Capillary 105 (H) 70 - 99 mg/dL  Glucose, capillary  Status: Abnormal   Collection Time: 12/08/17 12:02 PM  Result Value Ref Range   Glucose-Capillary 164 (H) 70 - 99 mg/dL  Glucose, capillary     Status: Abnormal   Collection Time: 12/08/17  4:13 PM  Result Value Ref Range   Glucose-Capillary 147 (H) 70 - 99 mg/dL  Glucose, capillary     Status: Abnormal   Collection Time: 12/08/17  9:18 PM  Result Value Ref Range   Glucose-Capillary 163 (H) 70 - 99 mg/dL  Heparin level (unfractionated)     Status: None   Collection Time: 12/09/17  5:15 AM  Result Value Ref Range   Heparin Unfractionated 0.37 0.30 - 0.70 IU/mL    Comment: (NOTE) If heparin results are below expected values, and patient dosage has  been confirmed, suggest follow up testing of antithrombin III levels. Performed at [facility], [place] [address] [place], [place] [address]   CBC     Status: Abnormal   Collection Time: 12/09/17  5:15 AM  Result Value Ref Range   WBC 3.4 (L) 4.0 - 10.5 K/uL   RBC 5.63 4.22 - 5.81 MIL/uL   Hemoglobin 12.6 (L) 13.0 - 17.0 g/dL   HCT 41.4 39.0 - 52.0 %   MCV 73.5 (L) 78.0 - 100.0 fL   MCH 22.4 (L) 26.0 - 34.0 pg   MCHC 30.4 30.0 - 36.0 g/dL   RDW 21.2 (H) 11.5 - 15.5 %   Platelets 97 (L) 150 - 400 K/uL    Comment: CONSISTENT WITH PREVIOUS RESULT Performed at [facility] [address] [place], [place] [address]   Basic metabolic panel     Status: Abnormal   Collection Time: 12/09/17  5:15 AM  Result Value Ref Range   Sodium 138 135 - 145 mmol/L   Potassium 3.8 3.5 - 5.1 mmol/L   Chloride 110 98 - 111 mmol/L    Comment: Please note change in reference range.   CO2 18 (L) 22 - 32 mmol/L   Glucose, Bld 112 (H) 70 - 99 mg/dL    Comment: Please note change  in reference range.   BUN 55 (H) 8 - 23 mg/dL    Comment: Please note change in reference range.   Creatinine, Ser 1.48 (H) 0.61 - 1.24 mg/dL   Calcium 7.7 (L) 8.9 - 10.3 mg/dL   GFR calc non Af Amer 47 (L) >60 mL/min   GFR calc Af Amer 54 (L) >60 mL/min    Comment: (NOTE) The eGFR has been calculated using the CKD EPI equation. This calculation has not been validated in all clinical situations. eGFR's persistently <60 mL/min signify possible Chronic Kidney Disease.    Anion gap 10 5 - 15    Comment: Performed at [facility] [address] [place], [place] [address]  Glucose, capillary     Status: None   Collection Time: 12/09/17  7:31 AM  Result Value Ref Range   Glucose-Capillary 94 70 - 99 mg/dL    ECG   N/A  Telemetry   A-fib with CVR - Personally Reviewed  Radiology    Dg Chest 2 View  Result Date: 12/07/2017 CLINICAL DATA:  Tachycardia.  Left leg DVT.  Ex-smoker. EXAM: CHEST - 2 VIEW COMPARISON:  12/06/2017. FINDINGS: Stable enlarged cardiac silhouette and post CABG changes. Clear lungs with normal vascularity. Moderate right AC joint inferior spur formation. Thoracic spine degenerative changes. Coronary artery stent. IMPRESSION: No acute abnormality.  Stable cardiomegaly. Electronically Signed  By: Claudie Revering M.D.   On: 12/07/2017 19:45   Nm Pulmonary Perf And Vent  Result Date: 12/07/2017 CLINICAL DATA:  Left leg DVT period hypotension.  Ex-smoker. EXAM: NUCLEAR MEDICINE VENTILATION - PERFUSION LUNG SCAN TECHNIQUE: Ventilation images were obtained in multiple projections using inhaled aerosol Tc-[id number]. Perfusion images were obtained in multiple projections after intravenous injection of Tc-961mAA. RADIOPHARMACEUTICALS:  30.3 mCi of Tc-9924mPA aerosol inhalation and 4.3 mCi Tc99m61m IV COMPARISON:  Chest radiographs obtained today. FINDINGS: Ventilation: Normal ventilation of both lungs. Perfusion: Normal perfusion of both lungs with no perfusion defects.  IMPRESSION: Normal examination.  No evidence of pulmonary embolism. Electronically Signed   By: StevClaudie Revering.   On: 12/07/2017 19:59    Cardiac Studies   N/A  Assessment   Principal Problem:   A-fib (with RVR) HCC) Active Problems:   Hypertension   Diabetes mellitus (HCC)   Chronic combined systolic and diastolic heart failure, NYHA class 2 (HCC)   Coronary artery disease involving coronary bypass graft of native heart with angina pectoris (HCC)   Postural dizziness with presyncope   DVT (deep venous thrombosis) (HCC)   AKI (acute kidney injury) (HCC)[facility]   1. Mr. NeesArthurs some a-fib with RVR overnight - rate better controlled today. Will add low dose coreg 3.125 mg BID in addition to amiodarone for rate control and sCHF benefit. Agree with transition to Eliquis 10 mg BID (per dosing guidelines for DVT) x 7 days, then 5 mg po BID thereafter (for DVT+a-fib). We may consider outpatient DCCV if he remains in afib after minimum 3 weeks of uninterrupted anticoagulation and he is appropriately loaded on amiodarone. Will d/c aspirin as he would otherwise be on triple therapy with high bleeding risk (had recent PCI in 07/2017) and continue Plavix 75 mg daily + Eliquis. D/w Dr. HongAlgis Limingime Spent Directly with Patient:  I have spent a total of 25 minutes with the patient reviewing hospital notes, telemetry, EKGs, labs and examining the patient as well as establishing an assessment and plan that was discussed personally with the patient. > 50% of time was spent in direct patient care.  Length of Stay:  LOS: 3 days   KennPixie Casino, FACCAlvarado Parkway Institute B.H.S.CPHood Riverector of the [facility]  [facility] Diplomate of the American Board of Clinical Lipidology Attending Cardiologist  Direct Dial: [phone]: [phone]:  [email or web address] Earlene Plater7/2019, 8:57 AM

## 2017-12-10 LAB — CBC
HEMATOCRIT: 40.4 % (ref 39.0–52.0)
Hemoglobin: 12.4 g/dL — ABNORMAL LOW (ref 13.0–17.0)
MCH: 22.5 pg — ABNORMAL LOW (ref 26.0–34.0)
MCHC: 30.7 g/dL (ref 30.0–36.0)
MCV: 73.3 fL — AB (ref 78.0–100.0)
Platelets: 133 10*3/uL — ABNORMAL LOW (ref 150–400)
RBC: 5.51 MIL/uL (ref 4.22–5.81)
RDW: 20.8 % — AB (ref 11.5–15.5)
WBC: 3.5 10*3/uL — ABNORMAL LOW (ref 4.0–10.5)

## 2017-12-10 LAB — BASIC METABOLIC PANEL
Anion gap: 8 (ref 5–15)
BUN: 39 mg/dL — AB (ref 8–23)
CO2: 23 mmol/L (ref 22–32)
CREATININE: 1.23 mg/dL (ref 0.61–1.24)
Calcium: 8.5 mg/dL — ABNORMAL LOW (ref 8.9–10.3)
Chloride: 109 mmol/L (ref 98–111)
GFR calc Af Amer: >60 mL/min (ref >60)
GFR calc non Af Amer: 58 mL/min — ABNORMAL LOW (ref >60)
GLUCOSE: 74 mg/dL (ref 70–99)
Potassium: 3.3 mmol/L — ABNORMAL LOW (ref 3.5–5.1)
Sodium: 140 mmol/L (ref 135–145)

## 2017-12-10 LAB — GLUCOSE, CAPILLARY
GLUCOSE-CAPILLARY: 64 mg/dL — AB (ref 70–99)
GLUCOSE-CAPILLARY: 199 mg/dL — AB (ref 70–99)

## 2017-12-10 MED ORDER — CARVEDILOL 3.125 MG PO TABS: 3.1250 mg | ORAL_TABLET | Freq: Two times a day (BID) | ORAL | 0 refills | Status: DC

## 2017-12-10 MED ORDER — LANTUS SOLOSTAR 100 UNIT/ML ~~LOC~~ SOPN
20.0000 [IU] | PEN_INJECTOR | Freq: Every day | SUBCUTANEOUS | Status: DC
Start: 2017-12-11 — End: 2017-12-20

## 2017-12-10 MED ORDER — INSULIN GLARGINE 100 UNIT/ML ~~LOC~~ SOLN
20.0000 [IU] | Freq: Every day | SUBCUTANEOUS | Status: DC
  Administered 2017-12-10: 20 [IU] via SUBCUTANEOUS
  Filled 2017-12-10 (×2): qty 0.2

## 2017-12-10 MED ORDER — AMIODARONE HCL 400 MG PO TABS: 400.0000 mg | ORAL_TABLET | Freq: Two times a day (BID) | ORAL | 0 refills | Status: DC

## 2017-12-10 MED ORDER — ELIQUIS 5 MG VTE STARTER PACK: ORAL_TABLET | ORAL | 0 refills | Status: DC

## 2017-12-10 MED ORDER — INSULIN ASPART 100 UNIT/ML ~~LOC~~ SOLN
0.0000 [IU] | Freq: Three times a day (TID) | SUBCUTANEOUS | Status: DC
  Administered 2017-12-10: 2 [IU] via SUBCUTANEOUS

## 2017-12-10 MED ORDER — POTASSIUM CHLORIDE CRYS ER 20 MEQ PO TBCR
40.0000 meq | EXTENDED_RELEASE_TABLET | Freq: Once | ORAL | Status: AC
  Administered 2017-12-10: 40 meq via ORAL
  Filled 2017-12-10: qty 2

## 2017-12-10 NOTE — Progress Notes (Signed)
OT Cancellation Note and Discharge  Patient Details Name: Justin Blackburn. MRN: [id number] DOB: 05-18-1951   Cancelled Treatment:    Reason Eval/Treat Not Completed: Other (comment)(Pt adamently refusing any OT/PT at this time per CM and RN).   Spoke with RN and Case Management who state that Pt is adamantly refusing any therapy at this time.   If Pt chooses to be participatory/willing to perform OT evaluation, please feel free to re-consult, OT would be MORE than happy to assist Justin Blackburn 12/10/2017, 1:47 PM  Hulda Humphrey OTR/L [phone]

## 2017-12-10 NOTE — Discharge Instructions (Addendum)
Information on my medicine - ELIQUIS (apixaban)  This medication education was reviewed with me or my healthcare representative as part of my discharge preparation.    Why was Eliquis prescribed for you? 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  What do You need to know about Eliquis ? The starting dose is 10 mg (two 5 mg tablets) taken TWICE daily for the FIRST SEVEN (7) DAYS, then on 12/16/2017 the dose is reduced to ONE 5 mg tablet taken TWICE daily.  Eliquis may be taken with or without food.   Try to take the dose about the same time in the morning and in the evening. If you have difficulty swallowing the tablet whole please discuss with your pharmacist how to take the medication safely.  Take Eliquis exactly as prescribed and DO NOT stop taking Eliquis without talking to the doctor who prescribed the medication.  Stopping may increase your risk of developing a new blood clot.  Refill your prescription before you run out.  After discharge, you should have regular check-up appointments with your healthcare provider that is prescribing your Eliquis.    What do you do if you miss a dose? If a dose of ELIQUIS is not taken at the scheduled time, take it as soon as possible on the same day and twice-daily administration should be resumed. The dose should not be doubled to make up for a missed dose.  Important Safety Information A possible side effect of Eliquis is bleeding. You should call your healthcare provider right away if you experience any of the following: ? Bleeding from an injury or your nose that does not stop. ? Unusual colored urine (red or dark brown) or unusual colored stools (red or black). ? Unusual bruising for unknown reasons. ? A serious fall or if you hit your head (even if there is no bleeding).  Some  medicines may interact with Eliquis and might increase your risk of bleeding or clotting while on Eliquis. To help avoid this, consult your healthcare provider or pharmacist prior to using any new prescription or non-prescription medications, including herbals, vitamins, non-steroidal anti-inflammatory drugs (NSAIDs) and supplements.  This website has more information on Eliquis (apixaban): [email or web address]  Additional discharge instructions:  Please get your medications reviewed and adjusted by your Primary MD.  Please request your Primary MD to go over all Hospital Tests and Procedure/Radiological results at the follow up, please get all Hospital records sent to your Prim MD by signing hospital release before you go home.  If you had Pneumonia of Lung problems at the Hospital: Please get a 2 view Chest X ray done in 6-8 weeks after hospital discharge or sooner if instructed by your Primary MD.  If you have Congestive Heart Failure: Please call your Cardiologist or Primary MD anytime you have any of the following symptoms:  1) 3 pound weight gain in 24 hours or 5 pounds in 1 week  2) shortness of breath, with or without a dry hacking cough  3) swelling in the hands, feet or stomach  4) if you have to sleep on extra pillows at night in order to breathe  Follow cardiac low salt diet and 1.5 lit/day fluid restriction.  If you have diabetes Accuchecks 4 times/day, Once in AM empty stomach and then before each meal. Log in all results  and show them to your primary doctor at your next visit. If any glucose reading is under 80 or above 300 call your primary MD immediately.  If you have Seizure/Convulsions/Epilepsy: Please do not drive, operate heavy machinery, participate in activities at heights or participate in high speed sports until you have seen by Primary MD or a Neurologist and advised to do so again.  If you had Gastrointestinal Bleeding: Please ask your  Primary MD to check a complete blood count within one week of discharge or at your next visit. Your endoscopic/colonoscopic biopsies that are pending at the time of discharge, will also need to followed by your Primary MD.  Get Medicines reviewed and adjusted. Please take all your medications with you for your next visit with your Primary MD  Please request your Primary MD to go over all hospital tests and procedure/radiological results at the follow up, please ask your Primary MD to get all Hospital records sent to his/her office.  If you experience worsening of your admission symptoms, develop shortness of breath, life threatening emergency, suicidal or homicidal thoughts you must seek medical attention immediately by calling 911 or calling your MD immediately  if symptoms less severe.  You must read complete instructions/literature along with all the possible adverse reactions/side effects for all the Medicines you take and that have been prescribed to you. Take any new Medicines after you have completely understood and accpet all the possible adverse reactions/side effects.   Do not drive or operate heavy machinery when taking Pain medications.   Do not take more than prescribed Pain, Sleep and Anxiety Medications  Special Instructions: If you have smoked or chewed Tobacco  in the last 2 yrs please stop smoking, stop any regular Alcohol  and or any Recreational drug use.  Wear Seat belts while driving.  Please note You were cared for by a hospitalist during your hospital stay. If you have any questions about your discharge medications or the care you received while you were in the hospital after you are discharged, you can call the unit and asked to speak with the hospitalist on call if the hospitalist that took care of you is not available. Once you are discharged, your primary care physician will handle any further medical issues. Please note that NO REFILLS for any discharge medications  will be authorized once you are discharged, as it is imperative that you return to your primary care physician (or establish a relationship with a primary care physician if you do not have one) for your aftercare needs so that they can reassess your need for medications and monitor your lab values.  You can reach the hospitalist office at phone [phone] or fax [phone]   If you do not have a primary care physician, you can call [phone] for a physician referral.  Atrial Fibrillation Atrial fibrillation is a type of irregular or rapid heartbeat (arrhythmia). In atrial fibrillation, the heart quivers continuously in a chaotic pattern. This occurs when parts of the heart receive disorganized signals that make the heart unable to pump blood normally. This can increase the risk for stroke, heart failure, and other heart-related conditions. There are different types of atrial fibrillation, including:  Paroxysmal atrial fibrillation. This type starts suddenly, and it usually stops on its own shortly after it starts.  Persistent atrial fibrillation. This type often lasts longer than a week. It may stop on its own or with treatment.  Long-lasting persistent atrial fibrillation. This type lasts longer than 12 months.  Permanent atrial fibrillation. This type does not go away.  Talk with your health care provider to learn about the type of atrial fibrillation that you have. What are the causes? This condition is caused by some heart-related conditions or procedures, including:  A heart attack.  Coronary artery disease.  Heart failure.  Heart valve conditions.  High blood pressure.  Inflammation of the sac that surrounds the heart (pericarditis).  Heart surgery.  Certain heart rhythm disorders, such as Wolf-Parkinson-White syndrome.  Other causes include:  Pneumonia.  Obstructive sleep apnea.  Blockage of an artery in the lungs (pulmonary embolism, or PE).  Lung  cancer.  Chronic lung disease.  Thyroid problems, especially if the thyroid is overactive (hyperthyroidism).  Caffeine.  Excessive alcohol use or illegal drug use.  Use of some medicines, including certain decongestants and diet pills.  Sometimes, the cause cannot be found. What increases the risk? This condition is more likely to develop in:  People who are older in age.  People who smoke.  People who have diabetes mellitus.  People who are overweight (obese).  Athletes who exercise vigorously.  What are the signs or symptoms? Symptoms of this condition include:  A feeling that your heart is beating rapidly or irregularly.  A feeling of discomfort or pain in your chest.  Shortness of breath.  Sudden light-headedness or weakness.  Getting tired easily during exercise.  In some cases, there are no symptoms. How is this diagnosed? Your health care provider may be able to detect atrial fibrillation when taking your pulse. If detected, this condition may be diagnosed with:  An electrocardiogram (ECG).  A Holter monitor test that records your heartbeat patterns over a 24-hour period.  Transthoracic echocardiogram (TTE) to evaluate how blood flows through your heart.  Transesophageal echocardiogram (TEE) to view more detailed images of your heart.  A stress test.  Imaging tests, such as a CT scan or chest X-ray.  Blood tests.  How is this treated? The main goals of treatment are to prevent blood clots from forming and to keep your heart beating at a normal rate and rhythm. The type of treatment that you receive depends on many factors, such as your underlying medical conditions and how you feel when you are experiencing atrial fibrillation. This condition may be treated with:  Medicine to slow down the heart rate, bring the hearts rhythm back to normal, or prevent clots from forming.  Electrical cardioversion. This is a procedure that resets your hearts  rhythm by delivering a controlled, low-energy shock to the heart through your skin.  Different types of ablation, such as catheter ablation, catheter ablation with pacemaker, or surgical ablation. These procedures destroy the heart tissues that send abnormal signals. When the pacemaker is used, it is placed under your skin to help your heart beat in a regular rhythm.  Follow these instructions at home:  Take over-the counter and prescription medicines only as told by your health care provider.  If your health care provider prescribed a blood-thinning medicine (anticoagulant), take it exactly as told. Taking too much blood-thinning medicine can cause bleeding. If you do not take enough blood-thinning medicine, you will not have the protection that you need against stroke and other problems.  Do not use tobacco products, including cigarettes, chewing tobacco, and e-cigarettes. If you need help quitting, ask your health care provider.  If you have obstructive sleep apnea, manage your condition as told by your health care provider.  Do not drink alcohol.  Do  not drink beverages that contain caffeine, such as coffee, soda, and tea.  Maintain a healthy weight. Do not use diet pills unless your health care provider approves. Diet pills may make heart problems worse.  Follow diet instructions as told by your health care provider.  Exercise regularly as told by your health care provider.  Keep all follow-up visits as told by your health care provider. This is important. How is this prevented?  Avoid drinking beverages that contain caffeine or alcohol.  Avoid certain medicines, especially medicines that are used for breathing problems.  Avoid certain herbs and herbal medicines, such as those that contain ephedra or ginseng.  Do not use illegal drugs, such as cocaine and amphetamines.  Do not smoke.  Manage your high blood pressure. Contact a health care provider if:  You notice a change  in the rate, rhythm, or strength of your heartbeat.  You are taking an anticoagulant and you notice increased bruising.  You tire more easily when you exercise or exert yourself. Get help right away if:  You have chest pain, abdominal pain, sweating, or weakness.  You feel nauseous.  You notice blood in your vomit, bowel movement, or urine.  You have shortness of breath.  You suddenly have swollen feet and ankles.  You feel dizzy.  You have sudden weakness or numbness of the face, arm, or leg, especially on one side of the body.  You have trouble speaking, trouble understanding, or both (aphasia).  Your face or your eyelid droops on one side. These symptoms may represent a serious problem that is an emergency. Do not wait to see if the symptoms will go away. Get medical help right away. Call your local emergency services (911 in the [place] Do not drive yourself to the hospital. This information is not intended to replace advice given to you by your health care provider. Make sure you discuss any questions you have with your health care provider. Document Released: 06/01/2005 Document Revised: 10/09/2015 Document Reviewed: 09/26/2014 Elsevier Interactive Patient Education  2018 [employer].  Deep Vein Thrombosis Deep vein thrombosis (DVT) is a condition in which a blood clot forms in a deep vein, such as a lower leg, thigh, or arm vein. A clot is blood that has thickened into a gel or solid. This condition is dangerous. It can lead to serious and even life-threatening complications if the clot travels to the lungs and causes a blockage (pulmonary embolism). It can also damage veins in the leg. This can result in leg pain, swelling, discoloration, and sores (post-thrombotic syndrome). What are the causes? This condition may be caused by:  A slowdown of blood flow.  Damage to a vein.  A condition that makes blood clot more easily.  What increases the risk? The following  factors may make you more likely to develop this condition:  Being overweight.  Being elderly, especially over age 17.  Sitting or lying down for more than four hours.  Lack of physical activity (sedentary lifestyle).  Being pregnant, giving birth, or having recently given birth.  Taking medicines that contain estrogen.  Smoking.  A history of any of the following: ? Blood clots or blood clotting disease. ? Peripheral vascular disease. ? Inflammatory bowel disease. ? Cancer. ? Heart disease. ? Genetic conditions that affect how blood clots. ? Neurological diseases that affect the legs (leg paresis). ? Injury. ? Major or lengthy surgery. ? A central line placed inside a large vein.  What are the signs or  symptoms? Symptoms of this condition include:  Swelling, pain, or tenderness in an arm or leg.  Warmth, redness, or discoloration in an arm or leg.  If the clot is in your leg, symptoms may be more noticeable or worse when you stand or walk. Some people do not have any symptoms. How is this diagnosed? This condition is diagnosed with:  A medical history.  A physical exam.  Tests, such as: ? Blood tests. These are done to see how your blood clots. ? Imaging tests. These are done to check for clots. Tests may include:  Ultrasound.  CT scan.  MRI.  X-ray.  Venogram. For this test, X-rays are taken after a dye is injected into a vein.  How is this treated? Treatment for this condition depends on the cause, your risk for bleeding or developing more clots, and any medical conditions you have. Treatment may include:  Taking blood thinners (also called anticoagulants). These medicines may be taken by mouth, injected under the skin, or injected through an IV tube (catheter). These medicines prevent clots from forming.  Injecting medicine that dissolves blood clots into the affected vein (catheter-directed thrombolysis).  Having surgery. Surgery may be done  to: ? Remove the clot. ? Place a filter in a large vein to catch blood clots before they reach the lungs.  Some treatments may be continued for up to six months. Follow these instructions at home: If you are taking an oral blood thinner:  Take the medicine exactly as told by your health care provider. Some blood thinners need to be taken at the same time every day. Do not skip a dose.  Ask your health care provider about what foods and drugs interact with the medicine.  Ask about possible side effects. General instructions  Blood thinners can cause easy bruising and difficulty stopping bleeding. Because of this, if you are taking or were given a blood thinner: ? Hold pressure over cuts for longer than usual. ? Tell your dentist and other health care providers that you are taking blood thinners before having any procedures that can cause bleeding. ? Avoid contact sports.  Take over-the-counter and prescription medicines only as told by your health care provider.  Return to your normal activities as told by your health care provider. Ask your health care provider what activities are safe for you.  Wear compression stockings if recommended by your health care provider.  Keep all follow-up visits as told by your health care provider. This is important. How is this prevented? To lower your risk of developing this condition again:  For 30 or more minutes every day, do an activity that: ? Involves moving your arms and legs. ? Increases your heart rate.  When traveling for longer than four hours: ? Exercise your arms and legs every hour. ? Drink plenty of water. ? Avoid drinking alcohol.  Avoid sitting or lying for a long time without moving your legs.  Stay a healthy weight.  If you are a woman who is older than age 16, avoid unnecessary use of medicines that contain estrogen.  Do not use any products that contain nicotine or tobacco, such as cigarettes and e-cigarettes. This is  especially important if you take estrogen medicines. If you need help quitting, ask your health care provider.  Contact a health care provider if:  You miss a dose of your blood thinner.  You have nausea, vomiting, or diarrhea that lasts for more than one day.  Your menstrual period is  heavier than usual.  You have unusual bruising. Get help right away if:  You have new or increased pain, swelling, or redness in an arm or leg.  You have numbness or tingling in an arm or leg.  You have shortness of breath.  You have chest pain.  You have a rapid or irregular heartbeat.  You feel light-headed or dizzy.  You cough up blood.  There is blood in your vomit, stool, or urine.  You have a serious fall or accident, or you hit your head.  You have a severe headache or confusion.  You have a cut that will not stop bleeding. These symptoms may represent a serious problem that is an emergency. Do not wait to see if the symptoms will go away. Get medical help right away. Call your local emergency services (911 in the [place] Do not drive yourself to the hospital. Summary  DVT is a condition in which a blood clot forms in a deep vein, such as a lower leg, thigh, or arm vein.  Symptoms can include swelling, warmth, pain, and redness in your leg or arm.  Treatment may include taking blood thinners, injecting medicine that dissolves blood clots,wearing compression stockings, or surgery.  If you are prescribed blood thinners, take them exactly as told. This information is not intended to replace advice given to you by your health care provider. Make sure you discuss any questions you have with your health care provider. Document Released: 06/01/2005 Document Revised: 07/04/2016 Document Reviewed: 07/04/2016 Elsevier Interactive Patient Education  2018 [employer].  Orthostatic Hypotension Orthostatic hypotension is a sudden drop in blood pressure that happens when you quickly change  positions, such as when you get up from a seated or lying position. Blood pressure is a measurement of how strongly, or weakly, your blood is pressing against the walls of your arteries. Arteries are blood vessels that carry blood from your heart throughout your body. When blood pressure is too low, you may not get enough blood to your brain or to the rest of your organs. This can cause weakness, light-headedness, rapid heartbeat, and fainting. This can last for just a few seconds or for up to a few minutes. Orthostatic hypotension is usually not a serious problem. However, if it happens frequently or gets worse, it may be a sign of something more serious. What are the causes? This condition may be caused by:  Sudden changes in posture, such as standing up quickly after you have been sitting or lying down.  Blood loss.  Loss of body fluids (dehydration).  Heart problems.  Hormone (endocrine) problems.  Pregnancy.  Severe infection.  Lack of certain nutrients.  Severe allergic reactions (anaphylaxis).  Certain medicines, such as blood pressure medicine or medicines that make the body lose excess fluids (diuretics). Sometimes, this condition can be caused by not taking medicine as directed, such as taking too much of a certain medicine.  What increases the risk? Certain factors can make you more likely to develop orthostatic hypotension, including:  Age. Risk increases as you get older.  Conditions that affect the heart or the central nervous system.  Taking certain medicines, such as blood pressure medicine or diuretics.  Being pregnant.  What are the signs or symptoms? Symptoms of this condition may include:  Weakness.  Light-headedness.  Dizziness.  Blurred vision.  Fatigue.  Rapid heartbeat.  Fainting, in severe cases.  How is this diagnosed? This condition is diagnosed based on:  Your medical history.  Your  symptoms.  Your blood pressure measurement.  Your health care provider will check your blood pressure when you are: ? Lying down. ? Sitting. ? Standing.  A blood pressure reading is recorded as two numbers, such as "120 over 80" (or 120/80). The first ("top") number is called the systolic pressure. It is a measure of the pressure in your arteries as your heart beats. The second ("bottom") number is called the diastolic pressure. It is a measure of the pressure in your arteries when your heart relaxes between beats. Blood pressure is measured in a unit called mm Hg. Healthy blood pressure for adults is 120/80. If your blood pressure is below 90/60, you may be diagnosed with hypotension. Other information or tests that may be used to diagnose orthostatic hypotension include:  Your other vital signs, such as your heart rate and temperature.  Blood tests.  Tilt table test. For this test, you will be safely secured to a table that moves you from a lying position to an upright position. Your heart rhythm and blood pressure will be monitored during the test.  How is this treated? Treatment for this condition may include:  Changing your diet. This may involve eating more salt (sodium) or drinking more water.  Taking medicines to raise your blood pressure.  Changing the dosage of certain medicines you are taking that might be lowering your blood pressure.  Wearing compression stockings. These stockings help to prevent blood clots and reduce swelling in your legs.  In some cases, you may need to go to the hospital for:  Fluid replacement. This means you will receive fluids through an IV tube.  Blood replacement. This means you will receive donated blood through an IV tube (transfusion).  Treating an infection or heart problems, if this applies.  Monitoring. You may need to be monitored while medicines that you are taking wear off.  Follow these instructions at home: Eating and drinking   Drink enough fluid to keep your urine clear  or pale yellow.  Eat a healthy diet and follow instructions from your health care provider about eating or drinking restrictions. A healthy diet includes: ? Fresh fruits and vegetables. ? Whole grains. ? Lean meats. ? Low-fat dairy products.  Eat extra salt only as directed. Do not add extra salt to your diet unless your health care provider told you to do that.  Eat frequent, small meals.  Avoid standing up suddenly after eating. Medicines  Take over-the-counter and prescription medicines only as told by your health care provider. ? Follow instructions from your health care provider about changing the dosage of your current medicines, if this applies. ? Do not stop or adjust any of your medicines on your own. General instructions  Wear compression stockings as told by your health care provider.  Get up slowly from lying down or sitting positions. This gives your blood pressure a chance to adjust.  Avoid hot showers and excessive heat as directed by your health care provider.  Return to your normal activities as told by your health care provider. Ask your health care provider what activities are safe for you.  Do not use any products that contain nicotine or tobacco, such as cigarettes and e-cigarettes. If you need help quitting, ask your health care provider.  Keep all follow-up visits as told by your health care provider. This is important. Contact a health care provider if:  You vomit.  You have diarrhea.  You have a fever for more than 2-3 days.  You feel more thirsty than usual.  You feel weak and tired. Get help right away if:  You have chest pain.  You have a fast or irregular heartbeat.  You develop numbness in any part of your body.  You cannot move your arms or your legs.  You have trouble speaking.  You become sweaty or feel lightheaded.  You faint.  You feel short of breath.  You have trouble staying awake.  You feel confused. This  information is not intended to replace advice given to you by your health care provider. Make sure you discuss any questions you have with your health care provider. Document Released: 05/22/2002 Document Revised: 02/18/2016 Document Reviewed: 11/22/2015 Elsevier Interactive Patient Education  2018 [employer].

## 2017-12-10 NOTE — Progress Notes (Signed)
Pt in stable condition. I reviewed discharge instructions with patient and his wife. Walker delivered to bedside. Pt denies any pain. He was discharged to private vehicle via wheelchair

## 2017-12-10 NOTE — Discharge Summary (Addendum)
Physician Discharge Summary  Justin Blackburn DOB: May 28, 1951  PCP: [facility], [place] date: 12/06/2017 Discharge date: 12/10/2017  Recommendations for Outpatient Follow-up:  1. PCP at [facility] in 5 days with repeat labs (CBC & BMP). 2. Dr. Daneen Schick, Cardiology: Office will arrange follow-up appointment. 3. Dr Zola Button, Oncology 4. Recommend repeating TSH in 4-6 weeks.   Home Health: Patient and spouse refused home health PT and OT. Equipment/Devices: Rolling walker with 5 inch wheels.  Discharge Condition: Improved and stable. CODE STATUS: Full. Diet recommendation: Heart healthy & diabetic diet.  Discharge Diagnoses:  Principal Problem:   A-fib (with RVR) HCC) Active Problems:   Hypertension   Diabetes mellitus (HCC)   Chronic combined systolic and diastolic heart failure, NYHA class 2 (HCC)   Coronary artery disease involving coronary bypass graft of native heart with angina pectoris (HCC)   Postural dizziness with presyncope   DVT (deep venous thrombosis) (HCC)   AKI (acute kidney injury) ([place])   Brief Summary: Patient is a 67 year old male with past medical history of coronary disease, A. fib not on anticoagulation due to history of GI bleed , history of esophageal cancer s/p XRT, hypertension, dysphagia following radiation therapy who presented to the emergency department with complaints of generalized weakness, palpitation and left lower extremity swelling.  Patient was found to be in A. fib with RVR in the emergency department.  Started on Cardizem drip.  Cardiology consulted.  Patient also had severe left lower extremity edema.  Venous Doppler positive for DVT.  Cardiology was consulted.  Assessment & Plan:   A. fib with RVR: Patient became hypotensive on Cardizem drip 6/25.  He was started on amiodarone drip.  Continued IV heparin for anticoagulation. Cardiology following. Transitioned to oral amiodarone load.   I discussed in detail with Dr. Alen Blew, patient's primary Oncologist on 6/26 who sees no contraindication to resuming Eliquis.  Discussed with Dr. Debara Pickett: Low-dose carvedilol 3.125 mg twice daily added to amiodarone, IV heparin switched to Eliquis for acute DVT and A. fib, may consider outpatient DCCV if he remains in A. fib after 3 weeks of uninterrupted anticoagulation and appropriate amiodarone load, aspirin discontinued to avoid bleeding risk but Plavix continued (recent PCI 07/2017) in addition to newly started Eliquis. Patient reverted to sinus rhythm overnight.  Telemetry shows sinus rhythm with short runs of PACs.  I discussed in detail with Dr. Debara Pickett today who indicates that these are not NSVT.  He recommends amiodarone 400 mg twice daily x1 week followed by 400 mg daily, carvedilol 3.125 mg twice daily, Eliquis anticoagulation, aspirin discontinued, continue Plavix and cleared for discharge.  Outpatient follow-up will be arranged by cardiology.  Improved and stable.  Left lower extremity DVT: Lower extremity venous Doppler confirmed acute deep vein thrombosis in the common femoral vein, femoral vein , popliteal vein and calf veins and also the left external iliac vein. VQ scan negative for PE. I discussed with PCCM on 6/26 who indicated that unless patient not a candidate for anticoagulation or if patient had mobile clot, would not recommend IVC filter. As discussed above, no absolute contraindications to oral anticoagulation. Initially on IV heparin infusion which was transitioned to Eliquis 6/27.  Elevate extremity.  History of esophageal cancer: Follows with Oncology as an outpatient.  Patient was just on pured diet due to dysphagia secondary to radiation therapy.  He is being planned for esophagectomy.  He has poor oral intake.  Discussed with speech therapy and  their input appreciated.  They recommend regular diet and thin liquids. Oral intake improving per his report.  Remains on PPI and  sucralfate.  History of hypertension: Was hypotensive because of Cardizem drip.  Hypotension resolved.  Blood pressures currently controlled.  Carvedilol started 6/27.  Patient has symptomatic mild orthostatic hypotension.  Only on low-dose carvedilol for rate control.  ARB, diltiazem and furosemide discontinued.  Close outpatient follow-up.  Orthostatic hypotension: Mild.  Patient has been counseled regarding gradual activity and he verbalizes understanding.  Continue to hold diuretics.  Reassess during outpatient follow-up regarding need to start Lasix.  Near syncope: Patient presented with almost passing out but clarifies today that he did not actually completely pass out.  Likely multifactorial related to A. fib with RVR and orthostatic hypotension.  Management as indicated above and close outpatient follow-up.  History of coronary disease: Status post CABG x2. S/P PCI. Denies any chest pain at present.  On aspirin,Plavix at home.Continue statin.  As indicated above, aspirin discontinued.  Continue Plavix and newly started Eliquis for DVT/A. fib.  Acute kidney injury/hypernatremia: Found to be severely dehydrated on presentation.  Started on IV fluids.  Creatinine has improved from 2.18-1.2 range.  Seems adequately hydrated.  Discontinue IV fluids.    Follow BMP closely as outpatient.  Hypokalemia: Replaced prior to discharge.  Follow BMP as outpatient.  History of chronic systolics and diastolic heart failure: Most recent ejection fraction is 40 to 45%.  On Lasix,cande sartan at home which are on hold due to acute kidney injury.  No overt features of CHF except left lower extremity edema secondary to acute DVT.  Consider resuming Lasix during close outpatient follow-up.  Not significantly volume overloaded clinically.  Diabetes mellitus with hypoglycemia: Patient had CBG of 64 this morning.  Not very symptomatic.  Likely related to ongoing poor oral intake.  Reduced Lantus further.   Advised improving oral intake.  Patient states that he does not check his CBGs at home but has supplies and was advised to check it couple times a day and follow-up with PCP regarding further adjustments.  Discontinued Januvia.  Thrombocytopenia: Unclear etiology.  Had platelet count of 101 on 6/13.?  Related to radiation.    Continues to gradually improve.  He actually has pancytopenia with slightly low white cell count and hemoglobin.  Continue to follow CBCs as outpatient.  Low TSH: 0.126.  Recommend repeating in 4 to 6 weeks.  Clinically euthyroid.    Consultants: Cardiology  Procedures: None   Discharge Instructions  Discharge Instructions    (HEART FAILURE PATIENTS) Call MD:  Anytime you have any of the following symptoms: 1) 3 pound weight gain in 24 hours or 5 pounds in 1 week 2) shortness of breath, with or without a dry hacking cough 3) swelling in the hands, feet or stomach 4) if you have to sleep on extra pillows at night in order to breathe.   Complete by:  As directed    Call MD for:  difficulty breathing, headache or visual disturbances   Complete by:  As directed    Call MD for:  extreme fatigue   Complete by:  As directed    Call MD for:  persistant dizziness or light-headedness   Complete by:  As directed    Call MD for:  persistant nausea and vomiting   Complete by:  As directed    Call MD for:  severe uncontrolled pain   Complete by:  As directed    Diet -  low sodium heart healthy   Complete by:  As directed    Diet Carb Modified   Complete by:  As directed    Increase activity slowly   Complete by:  As directed        Medication List    STOP taking these medications   aspirin 81 MG EC tablet   candesartan 16 MG tablet Commonly known as:  ATACAND   diltiazem 120 MG 24 hr capsule Commonly known as:  CARDIZEM CD   furosemide 20 MG tablet Commonly known as:  LASIX   HYDROcodone-acetaminophen 7.5-325 mg/15 ml solution Commonly known as:   HYCET   sitaGLIPtin 100 MG tablet Commonly known as:  JANUVIA     TAKE these medications   acetaminophen 325 MG tablet Commonly known as:  TYLENOL Take 2 tablets (650 mg total) by mouth every 6 (six) hours as needed for mild pain or headache.   amiodarone 400 MG tablet Commonly known as:  PACERONE Take 1 tablet (400 mg total) by mouth 2 (two) times daily. 1 tab (400 mg) 2 times daily for 1 week then reduce to 1 tab (400 mg) once daily.   atorvastatin 40 MG tablet Commonly known as:  LIPITOR Take 40 mg by mouth daily at 6 PM.   carvedilol 3.125 MG tablet Commonly known as:  COREG Take 1 tablet (3.125 mg total) by mouth 2 (two) times daily with a meal. What changed:    medication strength  how much to take   clopidogrel 75 MG tablet Commonly known as:  PLAVIX Take 1 tablet (75 mg total) by mouth daily. Take for 6 months. Plan to stop on August 27th. Check with cardiologist at that time.   ELIQUIS STARTER PACK 5 MG Tabs Take as directed on package: start with two-5mg  tablets twice daily for 7 days. On day 8, switch to one-5mg  tablet twice daily.  PHARMACY: Please discard the first 3 doses (10 mg each) which he received in the hospital.   ferrous sulfate 325 (65 FE) MG tablet Take 325 mg by mouth daily.   gabapentin 300 MG capsule Commonly known as:  NEURONTIN Take 300 mg by mouth 2 (two) times daily.   LANTUS SOLOSTAR 100 UNIT/ML Solostar Pen Generic drug:  Insulin Glargine Inject 20 Units into the skin daily. Start taking on:  12/11/2017 What changed:  how much to take   nitroGLYCERIN 0.4 MG SL tablet Commonly known as:  NITROSTAT Place 1 tablet (0.4 mg total) under the tongue every 5 (five) minutes as needed for chest pain (Angina).   pantoprazole 40 MG tablet Commonly known as:  PROTONIX Take 1 tablet (40 mg total) by mouth daily.   prochlorperazine 10 MG tablet Commonly known as:  COMPAZINE Take 1 tablet (10 mg total) by mouth every 6 (six) hours as needed  for nausea or vomiting.   sucralfate 1 g tablet Commonly known as:  CARAFATE Take 1 tablet (1 g total) by mouth 4 (four) times daily -  with meals and at bedtime. 5 min before meals for radiation induced esophagitis      Follow-up Information    Practice, [facility]. Schedule an appointment as soon as possible for a visit in 5 day(s).   Why:  To be seen with repeat labs (CBC & BMP). Contact information: [place] [address] [phone]        Belva Crome, MD Follow up.   Specialty:  Cardiology Why:  Office will arrange  follow-up appointment. Contact information: [address] [address] [phone]        Wyatt Portela, MD. Schedule an appointment as soon as possible for a visit.   Specialty:  Oncology Contact information: [place] [address] [place] .   Specialty:  Home Health Services Contact information: [place] [id number] [place] Follow up.   Why:  Best boy information: [place] [phone]          No Known Allergies    Procedures/Studies: Dg Chest 2 View  Result Date: 12/07/2017 CLINICAL DATA:  Tachycardia.  Left leg DVT.  Ex-smoker. EXAM: CHEST - 2 VIEW COMPARISON:  12/06/2017. FINDINGS: Stable enlarged cardiac silhouette and post CABG changes. Clear lungs with normal vascularity. Moderate right AC joint inferior spur formation. Thoracic spine degenerative changes. Coronary artery stent. IMPRESSION: No acute abnormality.  Stable cardiomegaly. Electronically Signed   By: Claudie Revering M.D.   On: 12/07/2017 19:45   Nm Pulmonary Perf And Vent  Result Date: 12/07/2017 CLINICAL DATA:  Left leg DVT period hypotension.  Ex-smoker. EXAM: NUCLEAR MEDICINE VENTILATION - PERFUSION LUNG SCAN TECHNIQUE:  Ventilation images were obtained in multiple projections using inhaled aerosol Tc-20m DTPA. Perfusion images were obtained in multiple projections after intravenous injection of Tc-14m-MAA. RADIOPHARMACEUTICALS:  30.3 mCi of Tc-37m DTPA aerosol inhalation and 4.3 mCi Tc60m-MAA IV COMPARISON:  Chest radiographs obtained today. FINDINGS: Ventilation: Normal ventilation of both lungs. Perfusion: Normal perfusion of both lungs with no perfusion defects. IMPRESSION: Normal examination.  No evidence of pulmonary embolism. Electronically Signed   By: Claudie Revering M.D.   On: 12/07/2017 19:59   Dg Chest Port 1 View  Result Date: 12/06/2017 CLINICAL DATA:  Tachycardia EXAM: PORTABLE CHEST 1 VIEW COMPARISON:  08/30/2017 FINDINGS: Stable cardiomegaly with aortic atherosclerosis. Post CABG change noted with median sternotomy sutures in place. Lungs are free of pulmonary consolidations. No pneumothorax or pleural effusion. Pulmonary edema. No acute osseous abnormality. IMPRESSION: 1. Stable cardiomegaly with post CABG change. 2. Aortic atherosclerosis. 3. No active pulmonary disease. Electronically Signed   By: Ashley Royalty M.D.   On: 12/06/2017 17:42      Subjective: Patient mildly dizzy at times in upright position.  No chest pain, dyspnea, palpitations or left leg pain reported.  Anxious to go home.  Has had chronic dark stools, likely related to oral iron.  Hemoglobin has been stable.  Declines home health services.  Discussed with RN.  Discharge Exam:  Vitals:   12/10/17 0506 12/10/17 0801 12/10/17 0941 12/10/17 1108  BP: (!) 126/93 133/85 137/86 (!) 141/85  Pulse: 73 64 76 75  Resp:      Temp: 98.9 F (37.2 C) (!) 97.5 F (36.4 C)  97.8 F (36.6 C)  TempSrc: Oral Oral  Oral  SpO2: 95% 98%  99%  Weight: 90.2 kg (198 lb 14.4 oz)     Height:        General exam: Pleasant middle-aged male, moderately built and nourished, sitting up comfortably in bed. Respiratory system: Slightly diminished breath  sounds in the bases but otherwise clear to auscultation.  No increased work of breathing. Cardiovascular system: S1 and S2 heard, RRR.  No JVD or murmurs.  Left lower extremity 2+ pitting edema.  No right lower extremity edema.  Telemetry personally reviewed: Sinus rhythm with some PVCs.. Gastrointestinal system: Abdomen is nondistended, soft and nontender. No organomegaly or masses felt. Normal bowel sounds heard.   Central nervous system: Alert and oriented. No focal neurological deficits.   Extremities: Severe edema on the left lower extremity without any other acute findings, no clubbing ,no cyanosis, distal peripheral pulses palpable.  Edema may be slowly decreasing. Skin: No rashes, lesions or ulcers,no icterus ,no pallor MSK: Normal muscle bulk,tone ,power Psychiatry: Judgement and insight appear normal. Mood & affect appropriate.       The results of significant diagnostics from this hospitalization (including imaging, microbiology, ancillary and laboratory) are listed below for reference.     Microbiology: Recent Results (from the past 240 hour(s))  MRSA PCR Screening     Status: None   Collection Time: 12/06/17 10:00 PM  Result Value Ref Range Status   MRSA by PCR NEGATIVE NEGATIVE Final    Comment:        The GeneXpert MRSA Assay (FDA approved for NASAL specimens only), is one component of a comprehensive MRSA colonization surveillance program. It is not intended to diagnose MRSA infection nor to guide or monitor treatment for MRSA infections. Performed at [facility], [place] [address], [place], [place] [address]      Labs: CBC: Recent Labs  Lab 12/06/17 1731 12/06/17 1756 12/07/17 0336 12/08/17 0549 12/09/17 0515 12/10/17 0801  WBC 3.8*  --  4.0 3.8* 3.4* 3.5*  NEUTROABS 2.8  --  2.6  --   --   --   HGB 14.0 15.0 13.3 12.8* 12.6* 12.4*  HCT 47.4 44.0 43.8 42.5 41.4 40.4  MCV 74.8*  --  74.5* 74.2* 73.5* 73.3*  PLT PLATELET CLUMPS NOTED ON SMEAR,  UNABLE TO ESTIMATE  --  80* 92* 97* 413*   Basic Metabolic Panel: Recent Labs  Lab 12/06/17 1731 12/06/17 1756 12/06/17 2028 12/07/17 0336 12/08/17 0549 12/09/17 0515 12/10/17 0801  NA 145 145  --  149* 144 138 140  K 3.4* 3.5  --  3.3* 3.6 3.8 3.3*  CL 105 108  --  115* 111 110 109  CO2 23  --   --  23 25 18* 23  GLUCOSE 394* 398*  --  121* 101* 112* 74  BUN 57* 53*  --  53* 62* 55* 39*  CREATININE 2.65* 2.50*  --  2.05* 2.16* 1.48* 1.23  CALCIUM 8.9  --   --  8.5* 8.3* 7.7* 8.5*  MG  --   --  2.0  --   --   --   --    Liver Function Tests: Recent Labs  Lab 12/07/17 0336  AST 23  ALT 22  ALKPHOS 59  BILITOT 0.6  PROT 5.4*  ALBUMIN 2.7*   BNP (last 3 results) Recent Labs    12/06/17 1731  BNP 121.7*    CBG: Recent Labs  Lab 12/09/17 1122 12/09/17 1613 12/09/17 2132 12/10/17 0759 12/10/17 1106  GLUCAP 155* 89 121* 64* 199*   I communicated with patient's spouse.  Updated care and answered questions.   Time coordinating discharge: 40 minutes  SIGNED:  Vernell Leep, MD, FACP, [facility]. [facility] Pager [phone]  If 7PM-7AM, please contact night-coverage [email or web address] Password [email or web address] 12/10/2017, 2:00 PM

## 2017-12-10 NOTE — Evaluation (Addendum)
Physical Therapy Evaluation Patient Details Name: Justin Blackburn. MRN: [id number] DOB: 06/29/1950 Today's Date: 12/10/2017   History of Present Illness  Pt is a 67 y/o male admitted secondary to a fib and LLE swelling and pain. Found to have LLE DVT and started on heparin. Imaging negative for PE. PMH includes HTN, DM, CHF, DVT, CAD s/p CABG, and esophageal cancer (just finished radiation).   Clinical Impression  Pt admitted secondary to problem above with deficits below. Mobility limited this session secondary to orthostatic vitals (see below or in vitals flowsheet). Required min guard for safety for mobility using RW within the room. Educated about use of RW at home to increase stability. Discussed follow up PT, however pt refusing at this time. Will continue to follow acutely to maximize functional mobility independence and safety.   Orthostatics: Supine: 123/82 mmHg; HR: 83 bpm Sitting: 110/62 mmHg; HR: 84 bpm Standing: 102/68 mmHg; HR: 89 bpm Standing at 3 min: 86/67 mmHg; HR: 90 bpm    Follow Up Recommendations No PT follow up;Supervision for mobility/OOB(Pt refusing any follow up therapy  )    Equipment Recommendations  Rolling walker with 5" wheels    Recommendations for Other Services OT consult     Precautions / Restrictions Precautions Precautions: Other (comment) Precaution Comments: watch BP  Restrictions Weight Bearing Restrictions: No      Mobility  Bed Mobility Overal bed mobility: Modified Independent             General bed mobility comments: Increased time, however, no physical assist required.   Transfers Overall transfer level: Needs assistance Equipment used: None;Rolling walker (2 wheeled) Transfers: Sit to/from Stand Sit to Stand: Min guard         General transfer comment: Min guard for safety.   Ambulation/Gait Ambulation/Gait assistance: Min guard Gait Distance (Feet): 15 Feet Assistive device: Rolling walker (2 wheeled) Gait  Pattern/deviations: Step-through pattern;Decreased stride length;Decreased weight shift to left;Antalgic Gait velocity: Decreased    General Gait Details: Slow, guarded antalgic gait. Gait limited to the room as pt reporting increased weakness and did have positive orthostatics (no lightheadedness or dizziness). Educated about use of RW at home to increase safety with mobility.   Stairs            Wheelchair Mobility    Modified Rankin (Stroke Patients Only)       Balance Overall balance assessment: Needs assistance Sitting-balance support: No upper extremity supported;Feet supported Sitting balance-Leahy Scale: Fair     Standing balance support: Bilateral upper extremity supported;No upper extremity supported;During functional activity Standing balance-Leahy Scale: Fair Standing balance comment: Able to maintain static standing without UE support.                              Pertinent Vitals/Pain Pain Assessment: Faces Faces Pain Scale: Hurts a little bit Pain Location: LLE  Pain Descriptors / Indicators: Grimacing Pain Intervention(s): Limited activity within patient's tolerance;Monitored during session;Repositioned    Home Living Family/patient expects to be discharged to:: Private residence Living Arrangements: Spouse/significant other Available Help at Discharge: Family;Available 24 hours/day Type of Home: House Home Access: Stairs to enter Entrance Stairs-Rails: Right;Left;Can reach both Entrance Stairs-Number of Steps: 2 Home Layout: One level Home Equipment: Cane - single point Additional Comments: .    Prior Function Level of Independence: Independent               Hand Dominance   Dominant Hand:  Right    Extremity/Trunk Assessment   Upper Extremity Assessment Upper Extremity Assessment: Defer to OT evaluation    Lower Extremity Assessment Lower Extremity Assessment: LLE deficits/detail;Generalized weakness LLE Deficits /  Details: LLE swolen at calf and ankle     Cervical / Trunk Assessment Cervical / Trunk Assessment: Kyphotic  Communication   Communication: No difficulties  Cognition Arousal/Alertness: Awake/alert Behavior During Therapy: WFL for tasks assessed/performed Overall Cognitive Status: Within Functional Limits for tasks assessed                                        General Comments General comments (skin integrity, edema, etc.): Pt with positive orthostatics, so mobility limited to room. Pt asymptomatic throughout; see vitals flowsheet    Exercises     Assessment/Plan    PT Assessment Patient needs continued PT services  PT Problem List Decreased strength;Decreased range of motion;Decreased activity tolerance;Decreased balance;Decreased mobility;Cardiopulmonary status limiting activity;Decreased knowledge of use of DME;Pain       PT Treatment Interventions DME instruction;Gait training;Stair training;Therapeutic activities;Functional mobility training;Therapeutic exercise;Balance training;Patient/family education    PT Goals (Current goals can be found in the Care Plan section)  Acute Rehab PT Goals Patient Stated Goal: to go home  PT Goal Formulation: With patient Time For Goal Achievement: 12/24/17 Potential to Achieve Goals: Good    Frequency Min 3X/week   Barriers to discharge        Co-evaluation               AM-PAC PT "6 Clicks" Daily Activity  Outcome Measure Difficulty turning over in bed (including adjusting bedclothes, sheets and blankets)?: None Difficulty moving from lying on back to sitting on the side of the bed? : A Little Difficulty sitting down on and standing up from a chair with arms (e.g., wheelchair, bedside commode, etc,.)?: Unable Help needed moving to and from a bed to chair (including a wheelchair)?: A Little Help needed walking in hospital room?: A Little Help needed climbing 3-5 steps with a railing? : A Lot 6 Click  Score: 16    End of Session Equipment Utilized During Treatment: Gait belt Activity Tolerance: Treatment limited secondary to medical complications (Comment)(orthostatics ) Patient left: in bed;with call bell/phone within reach Nurse Communication: Mobility status;Other (comment)(orthostatics ) PT Visit Diagnosis: Other abnormalities of gait and mobility (R26.89);Pain Pain - Right/Left: Left Pain - part of body: Leg    Time: 1020-1044 PT Time Calculation (min) (ACUTE ONLY): 24 min   Charges:   PT Evaluation $PT Eval Moderate Complexity: 1 Mod PT Treatments $Therapeutic Activity: 8-22 mins   PT G Codes:        Leighton Ruff, PT, DPT  Acute Rehabilitation Services  Pager: [phone]   Rudean Hitt 12/10/2017, 1:13 PM

## 2017-12-10 NOTE — Progress Notes (Signed)
DAILY PROGRESS NOTE   Patient Name: Justin Blackburn. Date of Encounter: 12/10/2017  Chief Complaint   No complaints today  Patient Profile   59M with a history of coronary artery disease status post remote CABG and multiple PCIs, chronic systolic and diastolic heart failure (most recent EF 45 to 50%), PAF not on anticoagulation due to GI bleeding, esophageal cancer undergoing radiation and awaiting esophagectomy, who presents with shortness of breath and palpitations, found to have atrial fibrillation with RVR and concern for left lower extremity DVT/possible PE  Subjective   Converted to sinus overnight - switched to Eliquis yesterday for DVT/afib at 10 mg BID.   Objective   Vitals:   12/09/17 1123 12/09/17 1614 12/09/17 2008 12/10/17 0506  BP: 106/66 133/81 121/67 (!) 126/93  Pulse: 71 77 (!) 56 73  Resp:   18   Temp: 97.8 F (36.6 C) 97.7 F (36.5 C) 98.1 F (36.7 C) 98.9 F (37.2 C)  TempSrc: Oral Oral Oral Oral  SpO2: 97% 97% 97% 95%  Weight:      Height:        Intake/Output Summary (Last 24 hours) at 12/10/2017 0726 Last data filed at 12/09/2017 1631 Gross per 24 hour  Intake 475.37 ml  Output 300 ml  Net 175.37 ml   Filed Weights   12/07/17 3646 12/08/17 0500 12/09/17 0500  Weight: 191 lb 5.8 oz (86.8 kg) 193 lb 9 oz (87.8 kg) 198 lb 10.2 oz (90.1 kg)    Physical Exam   General appearance: alert, pale and no distress, comfortable Neck: no carotid bruit, no JVD and thyroid not enlarged, symmetric, no tenderness/mass/nodules Lungs: diminished breath sounds bilaterally Heart: regular rate and rhythm Abdomen: soft, non-tender; bowel sounds normal; no masses,  no organomegaly Extremities: edema 2+ LLE edema, trace RLE edema Pulses: 2+ and symmetric Skin: pale, warm, dry Neurologic: Mental status: Alert, oriented, thought content appropriate Psych: Pleasant  Inpatient Medications    Scheduled Meds: . amiodarone  400 mg Oral BID  . apixaban  10 mg  Oral BID   Followed by  . [START ON 12/16/2017] apixaban  5 mg Oral BID  . atorvastatin  40 mg Oral q1800  . carvedilol  3.125 mg Oral BID WC  . clopidogrel  75 mg Oral Daily  . ferrous sulfate  325 mg Oral Daily  . gabapentin  300 mg Oral BID  . insulin aspart  0-15 Units Subcutaneous TID WC  . insulin glargine  25 Units Subcutaneous Daily  . linagliptin  5 mg Oral Daily  . pantoprazole  40 mg Oral Daily  . sucralfate  1 g Oral TID WC & HS    Continuous Infusions:   PRN Meds: acetaminophen, metoprolol tartrate, nitroGLYCERIN, ondansetron (ZOFRAN) IV, prochlorperazine   Labs   Results for orders placed or performed during the hospital encounter of 12/06/17 (from the past 48 hour(s))  Glucose, capillary     Status: Abnormal   Collection Time: 12/08/17  7:30 AM  Result Value Ref Range   Glucose-Capillary 105 (H) 70 - 99 mg/dL  Glucose, capillary     Status: Abnormal   Collection Time: 12/08/17 12:02 PM  Result Value Ref Range   Glucose-Capillary 164 (H) 70 - 99 mg/dL  Glucose, capillary     Status: Abnormal   Collection Time: 12/08/17  4:13 PM  Result Value Ref Range   Glucose-Capillary 147 (H) 70 - 99 mg/dL  Glucose, capillary     Status: Abnormal  Collection Time: 12/08/17  9:18 PM  Result Value Ref Range   Glucose-Capillary 163 (H) 70 - 99 mg/dL  Heparin level (unfractionated)     Status: None   Collection Time: 12/09/17  5:15 AM  Result Value Ref Range   Heparin Unfractionated 0.37 0.30 - 0.70 IU/mL    Comment: (NOTE) If heparin results are below expected values, and patient dosage has  been confirmed, suggest follow up testing of antithrombin III levels. Performed at [facility], [place] [address], [place], [place] [address]   CBC     Status: Abnormal   Collection Time: 12/09/17  5:15 AM  Result Value Ref Range   WBC 3.4 (L) 4.0 - 10.5 K/uL   RBC 5.63 4.22 - 5.81 MIL/uL   Hemoglobin 12.6 (L) 13.0 - 17.0 g/dL   HCT 41.4 39.0 - 52.0 %   MCV 73.5 (L) 78.0 -  100.0 fL   MCH 22.4 (L) 26.0 - 34.0 pg   MCHC 30.4 30.0 - 36.0 g/dL   RDW 21.2 (H) 11.5 - 15.5 %   Platelets 97 (L) 150 - 400 K/uL    Comment: CONSISTENT WITH PREVIOUS RESULT Performed at [facility] [address], [place], [place] [address]   Basic metabolic panel     Status: Abnormal   Collection Time: 12/09/17  5:15 AM  Result Value Ref Range   Sodium 138 135 - 145 mmol/L   Potassium 3.8 3.5 - 5.1 mmol/L   Chloride 110 98 - 111 mmol/L    Comment: Please note change in reference range.   CO2 18 (L) 22 - 32 mmol/L   Glucose, Bld 112 (H) 70 - 99 mg/dL    Comment: Please note change in reference range.   BUN 55 (H) 8 - 23 mg/dL    Comment: Please note change in reference range.   Creatinine, Ser 1.48 (H) 0.61 - 1.24 mg/dL   Calcium 7.7 (L) 8.9 - 10.3 mg/dL   GFR calc non Af Amer 47 (L) >60 mL/min   GFR calc Af Amer 54 (L) >60 mL/min    Comment: (NOTE) The eGFR has been calculated using the CKD EPI equation. This calculation has not been validated in all clinical situations. eGFR's persistently <60 mL/min signify possible Chronic Kidney Disease.    Anion gap 10 5 - 15    Comment: Performed at [facility] [address] [place], [place] [address]  Glucose, capillary     Status: None   Collection Time: 12/09/17  7:31 AM  Result Value Ref Range   Glucose-Capillary 94 70 - 99 mg/dL  Glucose, capillary     Status: Abnormal   Collection Time: 12/09/17 11:22 AM  Result Value Ref Range   Glucose-Capillary 155 (H) 70 - 99 mg/dL  Glucose, capillary     Status: None   Collection Time: 12/09/17  4:13 PM  Result Value Ref Range   Glucose-Capillary 89 70 - 99 mg/dL  Glucose, capillary     Status: Abnormal   Collection Time: 12/09/17  9:32 PM  Result Value Ref Range   Glucose-Capillary 121 (H) 70 - 99 mg/dL    ECG   N/A  Telemetry   Sinus with occasional PAC's, IVCD - Personally Reviewed  Radiology    No results found.  Cardiac Studies    N/A  Assessment   Principal Problem:   A-fib (with RVR) HCC) Active Problems:   Hypertension   Diabetes mellitus (HCC)   Chronic  combined systolic and diastolic heart failure, NYHA class 2 (HCC)   Coronary artery disease involving coronary bypass graft of native heart with angina pectoris (HCC)   Postural dizziness with presyncope   DVT (deep venous thrombosis) (HCC)   AKI (acute kidney injury) ([place])   Plan   1. Mr. Neely coverted to sinus rhythm overnight. Would continue amiodarone 400 mg BID x 7 days, the reduce to 400 mg daily. Continue Eliquis as per recommendations previously described. Plavix, but no aspirin. No further cardiac suggestions at this time. Will arrange earlier follow-up with Dr. Tamala Julian (he already has a 9/19 appt) after discharge.   Cardiology will sign-off. Call with questions.  Time Spent Directly with Patient:  I have spent a total of 15 minutes with the patient reviewing hospital notes, telemetry, EKGs, labs and examining the patient as well as establishing an assessment and plan that was discussed personally with the patient. > 50% of time was spent in direct patient care.  Length of Stay:  LOS: 4 days   Pixie Casino, MD, [facility], [place] Director of the Advanced Lipid Disorders &  Cardiovascular Risk Reduction Clinic Diplomate of the American Board of Clinical Lipidology Attending Cardiologist  Direct Dial: [phone]  Fax: [phone]  Website:  [email or web address]  Nadean Corwin Shayra Anton 12/10/2017, 7:26 AM

## 2017-12-14 DIAGNOSIS — I4891 Unspecified atrial fibrillation: Secondary | ICD-10-CM | POA: Diagnosis not present

## 2017-12-14 DIAGNOSIS — I739 Peripheral vascular disease, unspecified: Secondary | ICD-10-CM | POA: Diagnosis not present

## 2017-12-14 DIAGNOSIS — N179 Acute kidney failure, unspecified: Secondary | ICD-10-CM | POA: Diagnosis not present

## 2017-12-14 DIAGNOSIS — E782 Mixed hyperlipidemia: Secondary | ICD-10-CM | POA: Diagnosis not present

## 2017-12-14 DIAGNOSIS — I82409 Acute embolism and thrombosis of unspecified deep veins of unspecified lower extremity: Secondary | ICD-10-CM | POA: Diagnosis not present

## 2017-12-14 DIAGNOSIS — E1140 Type 2 diabetes mellitus with diabetic neuropathy, unspecified: Secondary | ICD-10-CM | POA: Diagnosis not present

## 2017-12-14 DIAGNOSIS — D509 Iron deficiency anemia, unspecified: Secondary | ICD-10-CM | POA: Diagnosis not present

## 2017-12-14 DIAGNOSIS — C160 Malignant neoplasm of cardia: Secondary | ICD-10-CM | POA: Diagnosis not present

## 2017-12-14 DIAGNOSIS — I5042 Chronic combined systolic (congestive) and diastolic (congestive) heart failure: Secondary | ICD-10-CM | POA: Diagnosis not present

## 2017-12-17 ENCOUNTER — Encounter: Payer: Self-pay | Admitting: *Deleted

## 2017-12-20 ENCOUNTER — Ambulatory Visit: Payer: Medicare HMO | Admitting: Interventional Cardiology

## 2017-12-20 ENCOUNTER — Encounter: Payer: Self-pay | Admitting: Interventional Cardiology

## 2017-12-20 VITALS — BP 112/74 | HR 75 | Ht 71.0 in | Wt 206.8 lb

## 2017-12-20 DIAGNOSIS — I5042 Chronic combined systolic (congestive) and diastolic (congestive) heart failure: Secondary | ICD-10-CM | POA: Diagnosis not present

## 2017-12-20 DIAGNOSIS — E08649 Diabetes mellitus due to underlying condition with hypoglycemia without coma: Secondary | ICD-10-CM | POA: Diagnosis not present

## 2017-12-20 DIAGNOSIS — I481 Persistent atrial fibrillation: Secondary | ICD-10-CM

## 2017-12-20 DIAGNOSIS — I824Y2 Acute embolism and thrombosis of unspecified deep veins of left proximal lower extremity: Secondary | ICD-10-CM | POA: Diagnosis not present

## 2017-12-20 DIAGNOSIS — I10 Essential (primary) hypertension: Secondary | ICD-10-CM | POA: Diagnosis not present

## 2017-12-20 DIAGNOSIS — I25709 Atherosclerosis of coronary artery bypass graft(s), unspecified, with unspecified angina pectoris: Secondary | ICD-10-CM

## 2017-12-20 DIAGNOSIS — Z794 Long term (current) use of insulin: Secondary | ICD-10-CM

## 2017-12-20 DIAGNOSIS — I739 Peripheral vascular disease, unspecified: Secondary | ICD-10-CM

## 2017-12-20 DIAGNOSIS — I4819 Other persistent atrial fibrillation: Secondary | ICD-10-CM

## 2017-12-20 DIAGNOSIS — C159 Malignant neoplasm of esophagus, unspecified: Secondary | ICD-10-CM | POA: Insufficient documentation

## 2017-12-20 MED ORDER — AMIODARONE HCL 200 MG PO TABS: 200.0000 mg | ORAL_TABLET | Freq: Every day | ORAL | 3 refills | Status: DC

## 2017-12-20 NOTE — Progress Notes (Signed)
Cardiology Office Note    Date:  12/20/2017   ID:  Justin Kida., DOB 1951-04-27, MRN [id number]  PCP:  [name], PA-C  Cardiologist: [name], MD   Chief Complaint  Patient presents with  . Atrial Fibrillation  . Congestive Heart Failure  . Coronary Artery Disease    History of Present Illness:  Justin Blackburn. is a 67 y.o. male CAD, CABG x2, recent left main and circumflex DES, upper GI bleed related to previously undiagnosed esophageal cancer, chemotherapy, paroxysmal atrial fibrillation, anticoagulation therapy, acute on chronic systolic and diastolic heart failure related to atrial fibrillation, returning after recent admission for GI bleed.  Feels stronger.  Breathing better.  Left lower extremity is still swollen.  No blood in his urine or stool.  On clopidogrel and apixaban for recent left main stent and acute DVT (related to his underlying malignancy).  He denies angina.  Past Medical History:  Diagnosis Date  . Angina   . Arthritis    "hands" (08/10/2017)  . Atrial flutter, paroxysmal ([place])    arrived with aflutter with RVR, EF down to 25-30%, s/p TEE DCCV on 01/29/2015  . Chronic lower back pain    "all the time from my sciatic nerve"  . Coronary artery disease   . Erectile dysfunction   . GERD (gastroesophageal reflux disease)   . Heart failure ([place])   . Hypercholesteremia   . Hypertension   . 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 Myocardial infarction ([place]) 1988; ~2505;  ~1996;~ 2000  . Obesity   . Peripheral vascular disease ([place])   . Umbilical hernia    unrepaired    Past Surgical History:  Procedure Laterality Date  . CARDIOVERSION N/A 01/29/2015   Procedure: CARDIOVERSION;  Surgeon: [name], MD;  Location: [facility];  Service: Cardiovascular;  Laterality: N/A;  . CORONARY ANGIOPLASTY WITH STENT PLACEMENT  08/31/11   "2 today; makes total of 3 stents"  . CORONARY ANGIOPLASTY WITH STENT PLACEMENT   08/10/2017   "2 today; makes a total of 5 stents" (08/10/2017)  . CORONARY ARTERY BYPASS GRAFT  1998; 2007   CABG X 4; CABG X4  . CORONARY STENT INTERVENTION N/A 08/10/2017   Procedure: CORONARY STENT INTERVENTION;  Surgeon: Sherren Mocha, MD;  Location: [facility];  Service: Cardiovascular;  Laterality: N/A;  . ESOPHAGOGASTRODUODENOSCOPY (EGD) WITH PROPOFOL N/A 09/01/2017   Procedure: ESOPHAGOGASTRODUODENOSCOPY (EGD) WITH PROPOFOL;  Surgeon: Laurence Spates, MD;  Location: [facility];  Service: Endoscopy;  Laterality: N/A;  . EUS N/A 09/29/2017   Procedure: UPPER ENDOSCOPIC ULTRASOUND (EUS) LINEAR;  Surgeon: Arta Silence, MD;  Location: [facility];  Service: Endoscopy;  Laterality: N/A;  . LEFT HEART CATH AND CORS/GRAFTS ANGIOGRAPHY N/A 08/10/2017   Procedure: LEFT HEART CATH AND CORS/GRAFTS ANGIOGRAPHY;  Surgeon: Sherren Mocha, MD;  Location: [facility];  Service: Cardiovascular;  Laterality: N/A;  . LEFT HEART CATHETERIZATION WITH CORONARY/GRAFT ANGIOGRAM N/A 08/31/2011   Procedure: LEFT HEART CATHETERIZATION WITH Beatrix Fetters;  Surgeon: [name], MD;  Location: [facility];  Service: Cardiovascular;  Laterality: N/A;  . PERIPHERAL VASCULAR CATHETERIZATION N/A 04/27/2016   Procedure: Abdominal Aortogram w/Lower Extremity;  Surgeon: Angelia Mould, MD;  Location: [facility];  Service: Cardiovascular;  Laterality: N/A;  . TEE WITHOUT CARDIOVERSION N/A 01/29/2015   Procedure: TRANSESOPHAGEAL ECHOCARDIOGRAM (TEE);  Surgeon: [name], MD;  Location: [facility];  Service: Cardiovascular;  Laterality: N/A;  Current Medications: Outpatient Medications Prior to Visit  Medication Sig Dispense Refill  . acetaminophen (TYLENOL) 325 MG tablet Take 2 tablets (650 mg total) by mouth every 6 (six) hours as needed for mild pain or headache.    [name] apixaban (ELIQUIS) 5 MG TABS tablet Take 5 mg by mouth 2 (two) times daily.    [name] atorvastatin  (LIPITOR) 40 MG tablet Take 40 mg by mouth daily at 6 PM.   3  . carvedilol (COREG) 3.125 MG tablet Take 1 tablet (3.125 mg total) by mouth 2 (two) times daily with a meal. 60 tablet 0  . clopidogrel (PLAVIX) 75 MG tablet Take 1 tablet (75 mg total) by mouth daily. Take for 6 months. Plan to stop on August 27th. Check with cardiologist at that time. 30 tablet 5  . ferrous sulfate 325 (65 FE) MG tablet Take 325 mg by mouth daily.  3  . gabapentin (NEURONTIN) 300 MG capsule Take 300 mg by mouth 2 (two) times daily.  1  . insulin glargine (LANTUS) 100 UNIT/ML injection Inject 25 Units into the skin daily.    . nitroGLYCERIN (NITROSTAT) 0.4 MG SL tablet Place 1 tablet (0.4 mg total) under the tongue every 5 (five) minutes as needed for chest pain ([place]). 25 tablet 3  . pantoprazole (PROTONIX) 40 MG tablet Take 1 tablet (40 mg total) by mouth daily. 30 tablet 11  . prochlorperazine (COMPAZINE) 10 MG tablet Take 1 tablet (10 mg total) by mouth every 6 (six) hours as needed for nausea or vomiting. 30 tablet 1  . sucralfate (CARAFATE) 1 g tablet Take 1 tablet (1 g total) by mouth 4 (four) times daily -  with meals and at bedtime. 5 min before meals for radiation induced esophagitis 120 tablet 2  . amiodarone (PACERONE) 400 MG tablet Take 400 mg by mouth daily.    [name] amiodarone (PACERONE) 400 MG tablet Take 1 tablet (400 mg total) by mouth 2 (two) times daily. 1 tab (400 mg) 2 times daily for 1 week then reduce to 1 tab (400 mg) once daily. 60 tablet 0  . ELIQUIS STARTER PACK (ELIQUIS STARTER PACK) 5 MG TABS Take as directed on package: start with two-5mg  tablets twice daily for 7 days. On day 8, switch to one-5mg  tablet twice daily.  PHARMACY: Please discard the first 3 doses (10 mg each) which he received in the hospital. 1 each 0  . LANTUS SOLOSTAR 100 UNIT/ML Solostar Pen Inject 20 Units into the skin daily.     No facility-administered medications prior to visit.      Allergies:   Patient has no  known allergies.   Social History   Socioeconomic History  . Marital status: Married    Spouse name: Not on file  . Number of children: Not on file  . Years of education: Not on file  . Highest education level: Not on file  Occupational History  . Not on file  Social Needs  . Financial resource strain: Not on file  . Food insecurity:    Worry: Not on file    Inability: Not on file  . Transportation needs:    Medical: Not on file    Non-medical: Not on file  Tobacco Use  . Smoking status: Former Smoker    Packs/day: 2.00    Years: 6.00    Pack years: 12.00    Types: Cigarettes    Last attempt to quit: 06/16/1971    Years since quitting: 46.5  .  Smokeless tobacco: Never Used  . Tobacco comment: "chewed on cigars; never really did chew tobacco"  Substance and Sexual Activity  . Alcohol use: No  . Drug use: No  . Sexual activity: Never  Lifestyle  . Physical activity:    Days per week: Not on file    Minutes per session: Not on file  . Stress: Not on file  Relationships  . Social connections:    Talks on phone: Not on file    Gets together: Not on file    Attends religious service: Not on file    Active member of club or organization: Not on file    Attends meetings of clubs or organizations: Not on file    Relationship status: Not on file  Other Topics Concern  . Not on file  Social History Narrative  . Not on file     Family History:  The patient's family history includes Diabetes in his father and mother; Heart attack in his brother, father, and mother; Heart disease in his father and mother.   ROS:   Please see the history of present illness.    Decreased energy.  Concerned it is left lower extremity is still swollen.  Discomfort with swallowing is improving.  Excessive fatigue.  No lightheadedness, dizziness, or palpitations. All other systems reviewed and are negative.   PHYSICAL EXAM:   VS:  BP 112/74   Pulse 75   Ht 5\' 11"  (1.803 m)   Wt 206 lb 12.8  oz (93.8 kg)   BMI 28.84 kg/m    GEN: Well nourished, well developed, in no acute distress  HEENT: normal  Neck: no JVD, carotid bruits, or masses Cardiac: RRR; no murmurs, rubs, or gallops.  Left lower extremity with erythema and edema from the dorsum of the foot to the knee.  Nontender.  Not warm. Respiratory:  clear to auscultation bilaterally, normal work of breathing GI: soft, nontender, nondistended, + BS MS: no deformity or atrophy  Skin: warm and dry, no rash Neuro:  Alert and Oriented x 3, Strength and sensation are intact Psych: euthymic mood, full affect  Wt Readings from Last 3 Encounters:  12/20/17 206 lb 12.8 oz (93.8 kg)  12/10/17 198 lb 14.4 oz (90.2 kg)  11/25/17 202 lb (91.6 kg)      Studies/Labs Reviewed:   EKG:  EKG  Not repeated  Recent Labs: 12/06/2017: B Natriuretic Peptide 121.7; Magnesium 2.0; TSH 0.126 12/07/2017: ALT 22 12/10/2017: BUN 39; Creatinine, Ser 1.23; Hemoglobin 12.4; Platelets 133; Potassium 3.3; Sodium 140   Lipid Panel    Component Value Date/Time   CHOL 149 12/07/2017 0336   CHOL 129 09/21/2017 1352   TRIG 98 12/07/2017 0336   HDL 33 (L) 12/07/2017 0336   HDL 31 (L) 09/21/2017 1352   CHOLHDL 4.5 12/07/2017 0336   VLDL 20 12/07/2017 0336   LDLCALC 96 12/07/2017 0336   LDLCALC 70 09/21/2017 1352    Additional studies/ records that were reviewed today include:  Data    ASSESSMENT:    1. Persistent atrial fibrillation ([place])   2. Coronary artery disease involving coronary bypass graft of native heart with angina pectoris ([place])   3. Essential hypertension   4. Chronic combined systolic and diastolic heart failure, NYHA class 2 ([place])   5. Acute deep vein thrombosis (DVT) of proximal vein of left lower extremity (HCC)   6. Diabetes mellitus due to underlying condition with hypoglycemia without coma, with long-term current use of insulin ([place])  7. Peripheral vascular disease ([place])      PLAN:  In order of problems listed  above:  1. Amiodarone currently at 400 mg/day.  He is clinically in sinus rhythm.  1 more week of 400 mg/day then decrease to 200 mg daily.  4 to 6-week follow-up with EKG. 2. Continue clopidogrel 75 mg/day for recent stent done approximately 4 months ago. 3. Adequate control 4. No obvious volume overload currently.  Left lower extremity swollen due to DVT 5. Continue apixaban 5 mg twice daily.  Keep leg elevated as much as possible.  Clinical follow-up in 4 to 6 weeks.  EKG and clinical follow-up at that time or 200 mg/day of home.  Not looking good for any upcoming esophageal surgery.  Will discuss with Dr. Servando Snare.  Medication Adjustments/Labs and Tests Ordered: Current medicines are reviewed at length with the patient today.  Concerns regarding medicines are outlined above.  Medication changes, Labs and Tests ordered today are listed in the Patient Instructions below. Patient Instructions  Medication Instructions:  1) Starting Monday, 12/27/17, decrease Amiodarone to 200mg  once daily  Labwork: None  Testing/Procedures: None  Follow-Up: Your physician recommends that you schedule a follow-up appointment in: 6 weeks with Dr. Tamala Julian (Can have 8/19 at 3pm or 8/20 at 10:40A)    Any Other Special Instructions Will Be Listed Below (If Applicable).     If you need a refill on your cardiac medications before your next appointment, please call your pharmacy.     Signed, [name], MD  12/20/2017 2:46 PM    [facility] [place], [place], [place]  [address] Phone: [phone]; Fax: [phone]

## 2017-12-20 NOTE — Patient Instructions (Addendum)
Medication Instructions:  1) Starting Monday, 12/27/17, decrease Amiodarone to 200mg  once daily  Labwork: None  Testing/Procedures: None  Follow-Up: Your physician recommends that you schedule a follow-up appointment in: 6 weeks with Dr. Tamala Julian (Can have 8/19 at 3pm or 8/20 at 10:40A)    Any Other Special Instructions Will Be Listed Below (If Applicable).     If you need a refill on your cardiac medications before your next appointment, please call your pharmacy.

## 2017-12-21 ENCOUNTER — Telehealth: Payer: Self-pay | Admitting: Interventional Cardiology

## 2017-12-21 NOTE — Telephone Encounter (Signed)
Records received from [facility]. Placed in Chart Prep. Pt appointment 02/01/18 with Dr.Smith.

## 2018-01-06 ENCOUNTER — Ambulatory Visit (INDEPENDENT_AMBULATORY_CARE_PROVIDER_SITE_OTHER): Payer: Medicare HMO | Admitting: Cardiothoracic Surgery

## 2018-01-06 ENCOUNTER — Ambulatory Visit
Admission: RE | Admit: 2018-01-06 | Discharge: 2018-01-06 | Disposition: A | Discharge status: Home / Self Care | Payer: Medicare HMO | Source: Ambulatory Visit | Attending: Cardiothoracic Surgery | Admitting: Cardiothoracic Surgery

## 2018-01-06 ENCOUNTER — Ambulatory Visit: Payer: Medicare HMO

## 2018-01-06 ENCOUNTER — Ambulatory Visit (HOSPITAL_COMMUNITY): Payer: Medicare HMO

## 2018-01-06 ENCOUNTER — Ambulatory Visit: Admission: RE | Admit: 2018-01-06 | Payer: Medicare HMO | Source: Ambulatory Visit | Admitting: Radiation Oncology

## 2018-01-06 ENCOUNTER — Telehealth: Payer: Self-pay | Admitting: Radiation Oncology

## 2018-01-06 ENCOUNTER — Encounter: Payer: Self-pay | Admitting: Cardiothoracic Surgery

## 2018-01-06 ENCOUNTER — Ambulatory Visit (HOSPITAL_COMMUNITY)
Admission: RE | Admit: 2018-01-06 | Discharge: 2018-01-06 | Disposition: A | Discharge status: Home / Self Care | Payer: Medicare HMO | Source: Ambulatory Visit | Attending: Cardiothoracic Surgery | Admitting: Cardiothoracic Surgery

## 2018-01-06 ENCOUNTER — Telehealth: Payer: Self-pay | Admitting: Oncology

## 2018-01-06 ENCOUNTER — Other Ambulatory Visit: Payer: Self-pay

## 2018-01-06 VITALS — BP 135/71 | HR 70 | Resp 16 | Ht 71.0 in | Wt 206.0 lb

## 2018-01-06 DIAGNOSIS — C155 Malignant neoplasm of lower third of esophagus: Secondary | ICD-10-CM | POA: Diagnosis not present

## 2018-01-06 DIAGNOSIS — C159 Malignant neoplasm of esophagus, unspecified: Secondary | ICD-10-CM

## 2018-01-06 DIAGNOSIS — Z951 Presence of aortocoronary bypass graft: Secondary | ICD-10-CM | POA: Diagnosis not present

## 2018-01-06 DIAGNOSIS — J90 Pleural effusion, not elsewhere classified: Secondary | ICD-10-CM | POA: Diagnosis not present

## 2018-01-06 DIAGNOSIS — Z5111 Encounter for antineoplastic chemotherapy: Secondary | ICD-10-CM | POA: Diagnosis not present

## 2018-01-06 LAB — PULMONARY FUNCTION TEST
FEF 25-75 Pre: 1.27 L/sec
FEF2575-%Pred-Pre: 48 %
FEV1-%Pred-Pre: 42 %
FEV1-Pre: 1.44 L
FEV1FVC-%Pred-Pre: 110 %
FEV6-%Pred-Pre: 40 %
FEV6-Pre: 1.76 L
FEV6FVC-%Pred-Pre: 106 %
FVC-%Pred-Pre: 38 %
FVC-Pre: 1.76 L
Pre FEV1/FVC ratio: 81 %
Pre FEV6/FVC Ratio: 100 %

## 2018-01-06 MED ORDER — IOPAMIDOL (ISOVUE-300) INJECTION 61%
100.0000 mL | Freq: Once | INTRAVENOUS | Status: AC | PRN
  Administered 2018-01-06: 100 mL via INTRAVENOUS

## 2018-01-06 NOTE — Progress Notes (Signed)
Pt arrived for pft this morning. Unfortunately the pft machine is unable to be used. Simple spirometry was done using our portable device. Dr Servando Snare office was notified.

## 2018-01-06 NOTE — Telephone Encounter (Signed)
Patient called to reschedule lab and stacy said to wait for rescheduling Md

## 2018-01-06 NOTE — Telephone Encounter (Signed)
There was confusion about the patient's schedule and 1 month follow up appointment today. We will cancel this since he's having imaging, following up with Dr. Servando Snare and lab work today. I will reach out to make sure he's doing alright the week of August 5. His wife is in agreement. She states his esophagitis has improved, which was quite rough on him during treatment, but he has had a bit more trouble in the last day or two. We will follow this expectantly or see him sooner if they wish.

## 2018-01-06 NOTE — Progress Notes (Signed)
[address]       [place] [address]             [place] [place] Record #[id number] Date of Birth: 1950/06/24  Referring: Hayden Pedro,* Primary Care: [name], [place] Primary Cardiologist: [name], MD  Chief Complaint:    Chief Complaint  Patient presents with  . 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 67 y.o. male has been followed in the office for for surgical evaluation of the distal esophageal adenocarcinoma,  by EUS staging T3.   I did coronary artery bypass grafting in 1998 and then subsequent redo with the right internal mammary to the right in 2007 his first bypass was at age 56.  He had stents placed in 2013 with intact right and left internal mammary grafts patent.  In March of this year he had repeat stenting with stents placed in the left main coronary artery and was started on Plavix.  Subsequent to this he developed GI bleed and was diagnosed with a distal esophageal adenocarcinoma, by EUS staging T3.     Radiation and weekly carboplatin/paclitaxel.  Week 1 of chemotherapy to start on 10/28/2017. apy. He is receiving therapy with radiation as well as weekly chemotherapy utilizing carboplatin and paclitaxel.    He completed 4 cycles of chemotherapy, the last course was dropped because of low platelet count Since last seen the patient was admitted with increasing swelling especially of his left leg, near syncope, DVT left leg. Since radiation therapy he has had some improvement in swallowing and is taking a p.o. Diet.    [phone]  Esophagogastric junction, biopsy - POORLY  DIFFERENTIATED ADENOCARCINOMA WITH MUCINOUS AND SIGNET RING CELL FEATURES.   He had an LIMA to the LAD and saphenous vein graft to the RCA in 2007. He has a persistently patent RIMA to the RCA from 2003. He is also had a saphenous vein graft to the first and second diagonals and prior obtuse marginal #1 stenting in 2004.   Current Activity/ Functional Status:  Patient is independent with mobility/ambulation, transfers, ADL's, IADL's.   Zubrod Score: At the time of surgery this patient's most appropriate activity status/level should be described as: []     0    Normal activity, no symptoms [x]     1    Restricted in physical strenuous activity but ambulatory, able to do out light work []     2    Ambulatory and capable of self care, unable to do work activities, up and about               >50 % of waking hours                              []     3    Only limited self care, in bed greater than 50% of waking hours []     4    Completely disabled, no self care, confined to bed  or chair []     5    Moribund   Past Medical History:  Diagnosis Date  . Angina   . Arthritis    "hands" (08/10/2017)  . Atrial flutter, paroxysmal ([place])    arrived with aflutter with RVR, EF down to 25-30%, s/p TEE DCCV on 01/29/2015  . Chronic lower back pain    "all the time from my sciatic nerve"  . Coronary artery disease   . Erectile dysfunction   . GERD (gastroesophageal reflux disease)   . Heart failure ([place])   . Hypercholesteremia   . Hypertension   . 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 Myocardial infarction ([place]) 1988; ~6283;  ~1996;~ 2000  . Obesity   . Peripheral vascular disease ([place])   . Umbilical hernia    unrepaired    Past Surgical History:  Procedure Laterality Date  . CARDIOVERSION N/A 01/29/2015   Procedure: CARDIOVERSION;  Surgeon: [name], MD;  Location: [facility];  Service: Cardiovascular;  Laterality: N/A;  . CORONARY ANGIOPLASTY WITH STENT PLACEMENT   08/31/11   "2 today; makes total of 3 stents"  . CORONARY ANGIOPLASTY WITH STENT PLACEMENT  08/10/2017   "2 today; makes a total of 5 stents" (08/10/2017)  . CORONARY ARTERY BYPASS GRAFT  1998; 2007   CABG X 4; CABG X4  . CORONARY STENT INTERVENTION N/A 08/10/2017   Procedure: CORONARY STENT INTERVENTION;  Surgeon: Sherren Mocha, MD;  Location: [facility];  Service: Cardiovascular;  Laterality: N/A;  . ESOPHAGOGASTRODUODENOSCOPY (EGD) WITH PROPOFOL N/A 09/01/2017   Procedure: ESOPHAGOGASTRODUODENOSCOPY (EGD) WITH PROPOFOL;  Surgeon: Laurence Spates, MD;  Location: [facility];  Service: Endoscopy;  Laterality: N/A;  . EUS N/A 09/29/2017   Procedure: UPPER ENDOSCOPIC ULTRASOUND (EUS) LINEAR;  Surgeon: Arta Silence, MD;  Location: [facility];  Service: Endoscopy;  Laterality: N/A;  . LEFT HEART CATH AND CORS/GRAFTS ANGIOGRAPHY N/A 08/10/2017   Procedure: LEFT HEART CATH AND CORS/GRAFTS ANGIOGRAPHY;  Surgeon: Sherren Mocha, MD;  Location: [facility];  Service: Cardiovascular;  Laterality: N/A;  . LEFT HEART CATHETERIZATION WITH CORONARY/GRAFT ANGIOGRAM N/A 08/31/2011   Procedure: LEFT HEART CATHETERIZATION WITH Beatrix Fetters;  Surgeon: [name], MD;  Location: [facility] CATH [facility];  Service: Cardiovascular;  Laterality: N/A;  . PERIPHERAL VASCULAR CATHETERIZATION N/A 04/27/2016   Procedure: Abdominal Aortogram w/Lower Extremity;  Surgeon: Angelia Mould, MD;  Location: [facility];  Service: Cardiovascular;  Laterality: N/A;  . TEE WITHOUT CARDIOVERSION N/A 01/29/2015   Procedure: TRANSESOPHAGEAL ECHOCARDIOGRAM (TEE);  Surgeon: [name], MD;  Location: [facility] ENDOSCOPY;  Service: Cardiovascular;  Laterality: N/A;    Family History  Problem Relation Age of Onset  . Heart disease Mother   . Heart attack Mother   . Diabetes Mother   . Heart disease Father   . Heart attack Father   . Diabetes Father   . Heart attack Brother   . Stroke Neg Hx       Social History   Tobacco Use  Smoking Status Former Smoker  . Packs/day: 2.00  . Years: 6.00  . Pack years: 12.00  . Types: Cigarettes  . Last attempt to quit: 06/16/1971  . Years since quitting: 46.5  Smokeless Tobacco Never Used  Tobacco Comment   "chewed on cigars; never really did chew tobacco"    Social History   Substance and Sexual Activity  Alcohol Use No     No Known Allergies  Current Outpatient Medications  Medication  Sig Dispense Refill  . acetaminophen (TYLENOL) 325 MG tablet Take 2 tablets (650 mg total) by mouth every 6 (six) hours as needed for mild pain or headache.    [name] amiodarone (PACERONE) 200 MG tablet Take 1 tablet (200 mg total) by mouth daily. 90 tablet 3  . apixaban (ELIQUIS) 5 MG TABS tablet Take 5 mg by mouth 2 (two) times daily.    [name] atorvastatin (LIPITOR) 40 MG tablet Take 40 mg by mouth daily at 6 PM.   3  . carvedilol (COREG) 3.125 MG tablet Take 1 tablet (3.125 mg total) by mouth 2 (two) times daily with a meal. 60 tablet 0  . clopidogrel (PLAVIX) 75 MG tablet Take 1 tablet (75 mg total) by mouth daily. Take for 6 months. Plan to stop on August 27th. Check with cardiologist at that time. 30 tablet 5  . ferrous sulfate 325 (65 FE) MG tablet Take 325 mg by mouth daily.  3  . gabapentin (NEURONTIN) 300 MG capsule Take 300 mg by mouth 2 (two) times daily.  1  . insulin glargine (LANTUS) 100 UNIT/ML injection Inject 25 Units into the skin daily.    . nitroGLYCERIN (NITROSTAT) 0.4 MG SL tablet Place 1 tablet (0.4 mg total) under the tongue every 5 (five) minutes as needed for chest pain (Angina). 25 tablet 3  . pantoprazole (PROTONIX) 40 MG tablet Take 1 tablet (40 mg total) by mouth daily. 30 tablet 11  . prochlorperazine (COMPAZINE) 10 MG tablet Take 1 tablet (10 mg total) by mouth every 6 (six) hours as needed for nausea or vomiting. 30 tablet 1  . sucralfate (CARAFATE) 1 g tablet Take 1 tablet (1 g total) by mouth 4 (four) times daily -  with  meals and at bedtime. 5 min before meals for radiation induced esophagitis 120 tablet 2   No current facility-administered medications for this visit.     Pertinent items are noted in HPI.   Review of Systems:     Cardiac Review of Systems: [Y] = yes  or   [ N ] = no   Chest Pain [  Y]  Resting SOB [ Y ] Exertional SOB  [Y]  Orthopnea [Y   Pedal Edema [ N ]    Palpitations Aqua.Slicker ] Syncope  Aqua.Slicker  ]   Presyncope Jazmín.Cullens   ]   General Review of Systems: [Y] = yes [  ]=no Constitional: recent weight change [ Y ];  Wt loss over the last 3 months [   ] anorexia [  ]; fatigue [  y]; nausea [  ]; night sweats [  ]; fever [  ]; or chills [  ];           Eye : blurred vision Blue.Reese  ]; diplopia [   ]; vision changes [  ];  Amaurosis fugax[  ]; Resp: cough [  ];  wheezing[  ];  hemoptysis[  ]; shortness of breath[  ]; paroxysmal nocturnal dyspnea[  ]; dyspnea on exertion[  ]; or orthopnea[  ];  GI:  gallstones[  ], vomiting[  ];  dysphagia[ y ]; melena[  ];  hematochezia [  ]; heartburn[  ];   Hx of  Colonoscopy[  ]; GU: kidney stones [  ]; hematuria[  ];   dysuria [  ];  nocturia[  ];  history of     obstruction [  ]; urinary frequency [  ]  Skin: rash, swelling[  ];, hair loss[  ];  peripheral edema[  ];  or itching[  ]; Musculosketetal: myalgias[  ];  joint swelling[  ];  joint erythema[  ];  joint pain[  ];  back pain[  ];  Heme/Lymph: bruising[  ];  bleeding[  ];  anemia[  ];  Neuro: TIA[  ];  headaches[  ];  stroke[  ];  vertigo[  ];  seizures[  ];   paresthesias[  ];  difficulty walking[  ];  Psych:depression[  ]; anxiety[  ];  Endocrine: diabetes[ y ];  thyroid dysfunction[  ];  Immunizations: Flu up to date [n  ]; Pneumococcal up to date [ n ];  Other:     PHYSICAL EXAMINATION: BP 135/71 (BP Location: Left Arm, Patient Position: Sitting, Cuff Size: Large)   Pulse 70   Resp 16   Ht 5\' 11"  (1.803 m)   Wt 206 lb (93.4 kg)   SpO2 92% Comment: RA  BMI 28.73 kg/m  General appearance:  alert and cooperative Head: Normocephalic, without obvious abnormality, atraumatic Neck: no adenopathy, no carotid bruit, no JVD, supple, symmetrical, trachea midline and thyroid not enlarged, symmetric, no tenderness/mass/nodules Lymph nodes: Cervical, supraclavicular, and axillary nodes normal. Resp: clear to auscultation bilaterally Back: symmetric, no curvature. ROM normal. No CVA tenderness. Cardio: regular rate and rhythm, S1, S2 normal, no murmur, click, rub or gallop GI: soft, non-tender; bowel sounds normal; no masses,  no organomegaly Extremities: edema Patient has significant swelling of both lower extremities left leg worse than the right, varicose veins noted and venous stasis dermatitis noted Neurologic: Grossly normal Overall the patient continues to appear weak, he is ambulatory in the office but with some difficulty  Diagnostic Studies & Laboratory data:     Recent Radiology Findings:    Ct Chest W Contrast/ Ct Abdomen W Contrast  Result Date: 01/06/2018 CLINICAL DATA:  Esophageal carcinoma status post chemo radiation therapy EXAM: CT CHEST AND ABDOMEN WITH CONTRAST TECHNIQUE: Multidetector CT imaging of the chest and abdomen was performed following the standard protocol during bolus administration of intravenous contrast. CONTRAST:  156mL ISOVUE-300 IOPAMIDOL (ISOVUE-300) INJECTION 61% COMPARISON:  PET-CT 09/16/2017 FINDINGS: CT CHEST FINDINGS Cardiovascular: CABG anatomy.  Pericardial effusion. Mediastinum/Nodes: No axillary or supraclavicular adenopathy. No mediastinal hilar adenopathy. The distal esophagus is thickened over a 6 cm segment leading up to the GE junction. There is calcifications in the GE junction region similar prior. Lungs/Pleura: Is interlobular septal thickening. Mild peripheral ground-glass opacities. Moderate effusion is. These findings are all very similar to PET-CT of 09/16/2017. No suspicious nodularity. Musculoskeletal: Midline sternotomy.  No  aggressive osseous lesion CT ABDOMEN FINDINGS Lower chest:  Lung bases are clear. Hepatobiliary: No focal hepatic lesion.  Several gallstones. Pancreas: Normal pancreatic parenchymal intensity. No ductal dilatation or inflammation. Spleen: Normal spleen. Adrenals/urinary tract: Adrenal glands normal. Nonobstructing calculus in the RIGHT kidney. Upper ureters normal Stomach/Bowel: Calcification the gastric cardiac region again noted. No gastro hepatic ligament lymphadenopathy Vascular/Lymphatic: Abdominal or is calcified. No periaortic adenopathy. No periportal adenopathy. Musculoskeletal: No aggressive osseous lesion IMPRESSION: 1. Diffuse esophageal thickening of the lower third esophagus related to radiation treatment. 2. No evidence of metastatic adenopathy in the mediastinum or upper abdomen. 3. No liver metastasis. 4. Similar bilateral pleural effusions with interstitial and pulmonary edema. Electronically Signed   By: Suzy Bouchard M.D.   On: 01/06/2018 11:10   I have independently reviewed the above radiology studies  and reviewed the findings with the patient.  CCLINICAL DATA:  Initial treatment strategy for esophageal cancer.  EXAM: NUCLEAR MEDICINE PET SKULL BASE TO THIGH  TECHNIQUE: 12.46 mCi F-18 FDG was injected intravenously. Full-ring PET imaging was performed from the skull base to thigh after the radiotracer. CT data was obtained and used for attenuation correction and anatomic localization.  Fasting blood glucose: 145 mg/dl  COMPARISON:  None.  FINDINGS: Mediastinal blood pool activity: SUV max 2.41  NECK: No hypermetabolic lymph nodes in the neck.  Incidental CT findings: none  CHEST: Mild hypermetabolism noted in the distal esophagus and along the GE junction consistent with patient's known cancer. SUV max is 5.2. No paraesophageal, mediastinal or upper abdominal adenopathy.  No hypermetabolic lung lesions. No worrisome pulmonary nodules on the CT  scan.  Incidental CT findings: Moderate-sized bilateral pleural effusions with bibasilar atelectasis, right greater than left.  Cardiac enlargement with surgical changes from coronary artery bypass surgery. Extensive three-vessel coronary artery calcifications.  ABDOMEN/PELVIS: No abnormal hypermetabolic activity within the liver, pancreas, adrenal glands, or spleen. No hypermetabolic lymph nodes in the abdomen or pelvis.  No enlarged or hypermetabolic gastrohepatic ligament, celiac axis or periportal lymph nodes.  Incidental CT findings: Cholelithiasis is noted. A right renal calculus is noted.  SKELETON: No focal hypermetabolic activity to suggest skeletal metastasis.  Incidental CT findings: none  IMPRESSION: 1. Mild-to-moderate hypermetabolism in the distal esophagus and along the GE junction. No paraesophageal, gastrohepatic ligament or celiac axis adenopathy. No findings for hepatic metastatic disease. 2. No evidence of pulmonary metastatic disease. There are moderate bilateral pleural effusions with bibasilar atelectasis.   Electronically Signed   By: Marijo Sanes M.D.   On: 09/16/2017 15:15  I have independently reviewed the above radiology studies  and reviewed the findings with the patient.   Recent Lab Findings: Lab Results  Component Value Date   WBC 3.5 (L) 12/10/2017   HGB 12.4 (L) 12/10/2017   HCT 40.4 12/10/2017   PLT 133 (L) 12/10/2017   GLUCOSE 74 12/10/2017   CHOL 149 12/07/2017   TRIG 98 12/07/2017   HDL 33 (L) 12/07/2017   LDLCALC 96 12/07/2017   ALT 22 12/07/2017   AST 23 12/07/2017   NA 140 12/10/2017   K 3.3 (L) 12/10/2017   CL 109 12/10/2017   CREATININE 1.23 12/10/2017   BUN 39 (H) 12/10/2017   CO2 23 12/10/2017   TSH 0.126 (L) 12/06/2017   INR 1.2 08/05/2017   HGBA1C 9.8 (H) 01/28/2015   ENDOSCOPIC FINDING: Findings: A large, fungating and ulcerating mass with small bleeding upon direct contact was found at  the gastroesophageal junction with extension into the cardia. The mass was partially obstructing and partially circumferential (involving two thirds of the lumen circumference). ENDOSONOGRAPHIC FINDING: There was no sign of significant endosonographic abnormality in the left lobe of the liver. There was no sign of significant endosonographic abnormality in the pancreatic body and pancreatic tail. No lymphadenopathy seen. A heterogenous mass was found in the gastroesophageal junction. The lesion was partially circumferential (involving 70% of the lumen). The endosonographic borders were irregular. There wassonographic evidence suggesting invasion into the muscularis propria (Layer 4) diffusely, and several patches where there was invasion through the muscularis propria. No neighboring adenopathy seen. No celiac adenopathy noted. - Partially obstructing, malignant esophageal tumor was found at the gastroesophageal junction with extension into the cardia. - There was no evidence of significant pathology in the left lobe of the liver. - There was no sign of significant pathology in the pancreatic body and  pancreatic tail. - No vascular involvement of celiac artery. - A mass was found in the gastroesophageal junction. A tissue diagnosis was obtained prior to this exam. This is of adenocarcinoma. This was staged T3 N0 Mx by endosonographic criteria.  Recent Cardiac Cath: Procedures   CORONARY STENT INTERVENTION  LEFT HEART CATH AND CORS/GRAFTS ANGIOGRAPHY  Conclusion     Ost LAD lesion is 100% stenosed.  Prox RCA lesion is 100% stenosed.  Ost LM to Dist LM lesion is 75% stenosed.  Prox Cx to Mid Cx lesion is 25% stenosed.  Prox Cx lesion is 70% stenosed.  LIMA graft was visualized by angiography and is normal in caliber.  The graft exhibits no disease.  RIMA graft was visualized by non-selective angiography.  A drug-eluting stent was successfully placed using a STENT SYNERGY  DES 3X24.  Post intervention, there is a 0% residual stenosis.  Post intervention, there is a 0% residual stenosis.  A drug-eluting stent was successfully placed.  Post intervention, there is a 0% residual stenosis.   1. Severe native 3 vessel CAD with total occlusion of the RCA and LAD, severe stenosis of the native left main and native circumflex 2. Successful PCI of the left main and LCx using DES platforms  Recommend: ASA 81 mg x 1 month, plavix 75 mg daily, and resume eliquis tomorrow   Cath 2013 1. Advanced coronary atherosclerotic disease with total occlusion of the LAD and native right coronary. Total occlusion of the circumflex after the large first obtuse marginal. The obtuse marginal #1 contains an eccentric 80-90% stenosis.  2. Successful stenting of the obtuse marginal to 0% with TIMI grade 3 flow.  3. Left ventricular dysfunction, with LVEF 40%, and inferior wall motion abnormality.  4. Widely patent right internal mammary and left internal mammary graft to the right coronary and LAD respectively.  5. Total occlusion of all previously placed saphenous vein grafts  Transthoracic Echocardiography  Patient:    Justin Blackburn, Justin Blackburn MR #:       [id number] Study Date: 08/31/2017 Gender:     M Age:        66 Height:     180.3 cm Weight:     101.6 kg BSA:        2.28 m^2 Pt. Status: Room:       2C01C   ADMITTING    Peter Martinique, M.D.  ATTENDING    Peter Martinique, M.D.  Ernestene Mention, Brittainy M  REFERRING    Rosita Fire, Brittainy M  PERFORMING   [facility], Inpatient  SONOGRAPHER  Jannett Celestine, RDCS  cc:  ------------------------------------------------------------------- LV EF: 45% -   50%  ------------------------------------------------------------------- History:   PMH:  Atrial Flutter 427.32. paroxysmal Atrial fibrillation.  Coronary artery disease.  PMH:    Myocardial infarction.  ------------------------------------------------------------------- Study Conclusions  - Left ventricle: The cavity size was normal. There was mild   concentric hypertrophy. Systolic function was mildly reduced. The   estimated ejection fraction was in the range of 45% to 50%.   Hypokinesis of the basal-midinferior and inferoseptal myocardium. - Mitral valve: Calcified annulus. There was mild regurgitation   directed centrally. - Left atrium: The atrium was mildly to moderately dilated. - Right ventricle: The cavity size was mildly dilated. Wall   thickness was normal. Systolic function was moderately reduced. - Right atrium: The atrium was mildly dilated.  ------------------------------------------------------------------- Labs, prior tests, procedures, and surgery: Coronary artery bypass grafting.  ------------------------------------------------------------------- Study data:  Comparison was made  to the study of 02/27/2015.  Study status:  Routine.  Procedure:  The patient reported no pain pre or post test. Transthoracic echocardiography. Image quality was adequate.          Transthoracic echocardiography.  M-mode, complete 2D, spectral Doppler, and color Doppler.  Birthdate: Patient birthdate: Jul 04, 1950.  Age:  Patient is 67 yr old.  Sex: Gender: male.    BMI: 31.3 kg/m^2.  Blood pressure:     115/73 Patient status:  Inpatient.  Study date:  Study date: 08/31/2017. Study time: 10:45 AM.  Location:  Bedside.  -------------------------------------------------------------------  ------------------------------------------------------------------- Left ventricle:  The cavity size was normal. There was mild concentric hypertrophy. Systolic function was mildly reduced. The estimated ejection fraction was in the range of 45% to 50%. Regional wall motion abnormalities:   Hypokinesis of the basal-midinferior and inferoseptal myocardium. The study was  not technically sufficient to allow evaluation of LV diastolic dysfunction due to atrial fibrillation.  ------------------------------------------------------------------- Aortic valve:   Trileaflet; normal thickness leaflets. Mobility was not restricted.  Doppler:  Transvalvular velocity was within the normal range. There was no stenosis. There was no regurgitation.   ------------------------------------------------------------------- Aorta:  Aortic root: The aortic root was normal in size.  ------------------------------------------------------------------- Mitral valve:   Calcified annulus. Mobility was not restricted. Doppler:  Transvalvular velocity was within the normal range. There was no evidence for stenosis. There was mild regurgitation directed centrally.    Peak gradient (D): 9 mm Hg.  ------------------------------------------------------------------- Left atrium:  The atrium was mildly to moderately dilated.   ------------------------------------------------------------------- Right ventricle:  The cavity size was mildly dilated. Wall thickness was normal. Systolic function was moderately reduced.   ------------------------------------------------------------------- Pulmonic valve:   Poorly visualized.  The valve appears to be grossly normal.    Doppler:  Transvalvular velocity was within the normal range. There was no evidence for stenosis.  ------------------------------------------------------------------- Tricuspid valve:   Structurally normal valve.    Doppler: Transvalvular velocity was within the normal range. There was no regurgitation.  ------------------------------------------------------------------- Pulmonary artery:   The main pulmonary artery was normal-sized. Systolic pressure was within the normal range.  ------------------------------------------------------------------- Right atrium:  The atrium was mildly  dilated.  ------------------------------------------------------------------- Pericardium:  There was no pericardial effusion.  ------------------------------------------------------------------- Systemic veins: Inferior vena cava: The vessel was normal in size.  ------------------------------------------------------------------- Measurements   Left ventricle                            Value         Reference  LV ID, ED, PLAX chordal                   46.4   mm     43 - 52  LV ID, ES, PLAX chordal                   34.4   mm     23 - 38  LV fx shortening, PLAX chordal    (L)     26     %      >=29  LV PW thickness, ED                       12.6   mm     ----------  IVS/LV PW ratio, ED                       1.11          <=  1.3  Stroke volume, 2D                         67     ml     ----------  Stroke volume/bsa, 2D                     29     ml/m^2 ----------    Ventricular septum                        Value         Reference  IVS thickness, ED                         14     mm     ----------    LVOT                                      Value         Reference  LVOT ID, S                                22     mm     ----------  LVOT area                                 3.8    cm^2   ----------  LVOT peak velocity, S                     88.8   cm/s   ----------  LVOT mean velocity, S                     61.4   cm/s   ----------  LVOT VTI, S                               17.5   cm     ----------    Aorta                                     Value         Reference  Aortic root ID, ED                        34     mm     ----------    Left atrium                               Value         Reference  LA ID, A-P, ES                            44     mm     ----------  LA ID/bsa, A-P  1.93   cm/m^2 <=2.2  LA volume, ES, 1-p A4C                    94.4   ml     ----------  LA volume/bsa, ES, 1-p A4C                41.3   ml/m^2 ----------  LA volume, ES,  1-p A2C                    85     ml     ----------  LA volume/bsa, ES, 1-p A2C                37.2   ml/m^2 ----------    Mitral valve                              Value         Reference  Mitral E-wave peak velocity               150    cm/s   ----------  Mitral deceleration time                  165    ms     150 - 230  Mitral peak gradient, D                   9      mm Hg  ----------    Pulmonary arteries                        Value         Reference  PA pressure, S, DP                        25     mm Hg  <=30    Tricuspid valve                           Value         Reference  Tricuspid regurg peak velocity            234.42 cm/s   ----------  Tricuspid peak RV-RA gradient             22     mm Hg  ----------  Tricuspid maximal regurg                  234.42 cm/s   ----------  velocity, PISA    Right atrium                              Value         Reference  RA ID, S-I, ES, A4C               (H)     55.1   mm     34 - 49  RA area, ES, A4C                          18.3   cm^2   8.3 - 19.5  RA volume, ES, A/L  49.7   ml     ----------  RA volume/bsa, ES, A/L                    21.8   ml/m^2 ----------    Systemic veins                            Value         Reference  Estimated CVP                             3      mm Hg  ----------    Right ventricle                           Value         Reference  RV ID, minor axis, ED, A4C base           43.6   mm     ----------  RV ID, minor axis, ED, A4C mid            48.8   mm     ----------  RV ID, major axis, ED, A4C                77.7   mm     55 - 91  TAPSE                                     7.32   mm     ----------  RV pressure, S, DP                        25     mm Hg  <=30  RV s&', lateral, S                         5.76   cm/s   ----------  Legend: (L)  and  (H)  mark values outside specified reference range.  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  He presented with GI bleeding and found to have a T3N0 by endoscopic ultrasound criteria.  Completed chemotherapy and radiation treatments to be completed 1 month ago.    I have discussed surgical resection with the patient and his wife .  The patient's recent CT scan does not preclude consideration for resection .  Morning importantlythe patient's major risk involving surgery will have to do with his cardiac risk, considering he is known severe coronary occlusive disease and placement of a recent drug-eluting stent in the left main.  His current weak would make him a prohibitive risk to proceed with major surgical intervention .  I discussed this with he and his wife, who agree currently he is too weak to proceed.  I will see him back in 1 month  Bilateral pleural effusions, if  persistent or enlarge  will need thoracentesis and check for malignancy  Grace Isaac MD      [address] [place] [address] Office [phone]   Beeper [phone]  01/06/2018 11:56 AM

## 2018-01-07 ENCOUNTER — Inpatient Hospital Stay: Payer: Medicare HMO

## 2018-01-07 ENCOUNTER — Ambulatory Visit (HOSPITAL_COMMUNITY): Payer: Medicare HMO

## 2018-01-11 ENCOUNTER — Inpatient Hospital Stay: Payer: Medicare HMO | Attending: Oncology | Admitting: Oncology

## 2018-01-14 ENCOUNTER — Inpatient Hospital Stay (HOSPITAL_COMMUNITY): Payer: Medicare HMO

## 2018-01-14 ENCOUNTER — Encounter (HOSPITAL_COMMUNITY): Payer: Self-pay | Admitting: Emergency Medicine

## 2018-01-14 ENCOUNTER — Other Ambulatory Visit (HOSPITAL_COMMUNITY): Payer: Medicare HMO

## 2018-01-14 ENCOUNTER — Telehealth: Payer: Self-pay | Admitting: Interventional Cardiology

## 2018-01-14 ENCOUNTER — Emergency Department (HOSPITAL_COMMUNITY): Payer: Medicare HMO

## 2018-01-14 ENCOUNTER — Inpatient Hospital Stay (HOSPITAL_COMMUNITY)
Admission: EM | Admit: 2018-01-14 | Discharge: 2018-01-27 | DRG: 280 | Disposition: A | Discharge status: Home / Self Care | Payer: Medicare HMO | Attending: Oncology | Admitting: Oncology

## 2018-01-14 ENCOUNTER — Other Ambulatory Visit: Payer: Self-pay

## 2018-01-14 DIAGNOSIS — D509 Iron deficiency anemia, unspecified: Secondary | ICD-10-CM | POA: Diagnosis present

## 2018-01-14 DIAGNOSIS — I509 Heart failure, unspecified: Secondary | ICD-10-CM

## 2018-01-14 DIAGNOSIS — J810 Acute pulmonary edema: Secondary | ICD-10-CM

## 2018-01-14 DIAGNOSIS — E46 Unspecified protein-calorie malnutrition: Secondary | ICD-10-CM

## 2018-01-14 DIAGNOSIS — I4892 Unspecified atrial flutter: Secondary | ICD-10-CM | POA: Diagnosis not present

## 2018-01-14 DIAGNOSIS — E0965 Drug or chemical induced diabetes mellitus with hyperglycemia: Secondary | ICD-10-CM | POA: Diagnosis not present

## 2018-01-14 DIAGNOSIS — R269 Unspecified abnormalities of gait and mobility: Secondary | ICD-10-CM | POA: Diagnosis not present

## 2018-01-14 DIAGNOSIS — L98499 Non-pressure chronic ulcer of skin of other sites with unspecified severity: Secondary | ICD-10-CM | POA: Diagnosis not present

## 2018-01-14 DIAGNOSIS — L89151 Pressure ulcer of sacral region, stage 1: Secondary | ICD-10-CM | POA: Diagnosis not present

## 2018-01-14 DIAGNOSIS — I5042 Chronic combined systolic (congestive) and diastolic (congestive) heart failure: Secondary | ICD-10-CM | POA: Diagnosis not present

## 2018-01-14 DIAGNOSIS — E785 Hyperlipidemia, unspecified: Secondary | ICD-10-CM

## 2018-01-14 DIAGNOSIS — E1165 Type 2 diabetes mellitus with hyperglycemia: Secondary | ICD-10-CM | POA: Diagnosis not present

## 2018-01-14 DIAGNOSIS — I255 Ischemic cardiomyopathy: Secondary | ICD-10-CM | POA: Diagnosis not present

## 2018-01-14 DIAGNOSIS — I4819 Other persistent atrial fibrillation: Secondary | ICD-10-CM

## 2018-01-14 DIAGNOSIS — D649 Anemia, unspecified: Secondary | ICD-10-CM | POA: Diagnosis not present

## 2018-01-14 DIAGNOSIS — J80 Acute respiratory distress syndrome: Secondary | ICD-10-CM | POA: Diagnosis not present

## 2018-01-14 DIAGNOSIS — Z7902 Long term (current) use of antithrombotics/antiplatelets: Secondary | ICD-10-CM | POA: Diagnosis not present

## 2018-01-14 DIAGNOSIS — I480 Paroxysmal atrial fibrillation: Secondary | ICD-10-CM

## 2018-01-14 DIAGNOSIS — I361 Nonrheumatic tricuspid (valve) insufficiency: Secondary | ICD-10-CM | POA: Diagnosis not present

## 2018-01-14 DIAGNOSIS — I252 Old myocardial infarction: Secondary | ICD-10-CM

## 2018-01-14 DIAGNOSIS — Z87891 Personal history of nicotine dependence: Secondary | ICD-10-CM

## 2018-01-14 DIAGNOSIS — E876 Hypokalemia: Secondary | ICD-10-CM | POA: Diagnosis not present

## 2018-01-14 DIAGNOSIS — R609 Edema, unspecified: Secondary | ICD-10-CM | POA: Diagnosis not present

## 2018-01-14 DIAGNOSIS — E119 Type 2 diabetes mellitus without complications: Secondary | ICD-10-CM

## 2018-01-14 DIAGNOSIS — E871 Hypo-osmolality and hyponatremia: Secondary | ICD-10-CM | POA: Diagnosis not present

## 2018-01-14 DIAGNOSIS — I110 Hypertensive heart disease with heart failure: Principal | ICD-10-CM

## 2018-01-14 DIAGNOSIS — R739 Hyperglycemia, unspecified: Secondary | ICD-10-CM | POA: Diagnosis not present

## 2018-01-14 DIAGNOSIS — R0902 Hypoxemia: Secondary | ICD-10-CM | POA: Diagnosis not present

## 2018-01-14 DIAGNOSIS — I471 Supraventricular tachycardia: Secondary | ICD-10-CM | POA: Diagnosis not present

## 2018-01-14 DIAGNOSIS — Z79899 Other long term (current) drug therapy: Secondary | ICD-10-CM

## 2018-01-14 DIAGNOSIS — R05 Cough: Secondary | ICD-10-CM | POA: Diagnosis not present

## 2018-01-14 DIAGNOSIS — Z8249 Family history of ischemic heart disease and other diseases of the circulatory system: Secondary | ICD-10-CM

## 2018-01-14 DIAGNOSIS — I2510 Atherosclerotic heart disease of native coronary artery without angina pectoris: Secondary | ICD-10-CM | POA: Diagnosis not present

## 2018-01-14 DIAGNOSIS — I440 Atrioventricular block, first degree: Secondary | ICD-10-CM | POA: Diagnosis not present

## 2018-01-14 DIAGNOSIS — Z8501 Personal history of malignant neoplasm of esophagus: Secondary | ICD-10-CM

## 2018-01-14 DIAGNOSIS — Z9221 Personal history of antineoplastic chemotherapy: Secondary | ICD-10-CM | POA: Diagnosis not present

## 2018-01-14 DIAGNOSIS — I5021 Acute systolic (congestive) heart failure: Secondary | ICD-10-CM | POA: Diagnosis not present

## 2018-01-14 DIAGNOSIS — I214 Non-ST elevation (NSTEMI) myocardial infarction: Secondary | ICD-10-CM

## 2018-01-14 DIAGNOSIS — Z86718 Personal history of other venous thrombosis and embolism: Secondary | ICD-10-CM | POA: Diagnosis not present

## 2018-01-14 DIAGNOSIS — Y95 Nosocomial condition: Secondary | ICD-10-CM | POA: Diagnosis present

## 2018-01-14 DIAGNOSIS — J9601 Acute respiratory failure with hypoxia: Secondary | ICD-10-CM

## 2018-01-14 DIAGNOSIS — T380X5D Adverse effect of glucocorticoids and synthetic analogues, subsequent encounter: Secondary | ICD-10-CM | POA: Diagnosis not present

## 2018-01-14 DIAGNOSIS — T380X5A Adverse effect of glucocorticoids and synthetic analogues, initial encounter: Secondary | ICD-10-CM | POA: Diagnosis not present

## 2018-01-14 DIAGNOSIS — C155 Malignant neoplasm of lower third of esophagus: Secondary | ICD-10-CM | POA: Diagnosis present

## 2018-01-14 DIAGNOSIS — J9690 Respiratory failure, unspecified, unspecified whether with hypoxia or hypercapnia: Secondary | ICD-10-CM

## 2018-01-14 DIAGNOSIS — Z7901 Long term (current) use of anticoagulants: Secondary | ICD-10-CM | POA: Diagnosis not present

## 2018-01-14 DIAGNOSIS — Z6823 Body mass index (BMI) 23.0-23.9, adult: Secondary | ICD-10-CM

## 2018-01-14 DIAGNOSIS — I21A1 Myocardial infarction type 2: Secondary | ICD-10-CM | POA: Diagnosis not present

## 2018-01-14 DIAGNOSIS — I25810 Atherosclerosis of coronary artery bypass graft(s) without angina pectoris: Secondary | ICD-10-CM | POA: Diagnosis not present

## 2018-01-14 DIAGNOSIS — Z951 Presence of aortocoronary bypass graft: Secondary | ICD-10-CM

## 2018-01-14 DIAGNOSIS — I5031 Acute diastolic (congestive) heart failure: Secondary | ICD-10-CM | POA: Diagnosis not present

## 2018-01-14 DIAGNOSIS — E1151 Type 2 diabetes mellitus with diabetic peripheral angiopathy without gangrene: Secondary | ICD-10-CM | POA: Diagnosis not present

## 2018-01-14 DIAGNOSIS — I472 Ventricular tachycardia: Secondary | ICD-10-CM | POA: Diagnosis present

## 2018-01-14 DIAGNOSIS — K219 Gastro-esophageal reflux disease without esophagitis: Secondary | ICD-10-CM | POA: Diagnosis present

## 2018-01-14 DIAGNOSIS — I481 Persistent atrial fibrillation: Secondary | ICD-10-CM | POA: Diagnosis not present

## 2018-01-14 DIAGNOSIS — I10 Essential (primary) hypertension: Secondary | ICD-10-CM | POA: Diagnosis not present

## 2018-01-14 DIAGNOSIS — R0689 Other abnormalities of breathing: Secondary | ICD-10-CM | POA: Diagnosis not present

## 2018-01-14 DIAGNOSIS — Z794 Long term (current) use of insulin: Secondary | ICD-10-CM | POA: Diagnosis not present

## 2018-01-14 DIAGNOSIS — J811 Chronic pulmonary edema: Secondary | ICD-10-CM | POA: Diagnosis not present

## 2018-01-14 DIAGNOSIS — Z923 Personal history of irradiation: Secondary | ICD-10-CM

## 2018-01-14 DIAGNOSIS — I4891 Unspecified atrial fibrillation: Secondary | ICD-10-CM

## 2018-01-14 DIAGNOSIS — Z955 Presence of coronary angioplasty implant and graft: Secondary | ICD-10-CM | POA: Diagnosis not present

## 2018-01-14 DIAGNOSIS — E440 Moderate protein-calorie malnutrition: Secondary | ICD-10-CM | POA: Diagnosis not present

## 2018-01-14 DIAGNOSIS — I5043 Acute on chronic combined systolic (congestive) and diastolic (congestive) heart failure: Secondary | ICD-10-CM | POA: Diagnosis not present

## 2018-01-14 DIAGNOSIS — E11622 Type 2 diabetes mellitus with other skin ulcer: Secondary | ICD-10-CM | POA: Diagnosis not present

## 2018-01-14 DIAGNOSIS — Z9981 Dependence on supplemental oxygen: Secondary | ICD-10-CM | POA: Diagnosis not present

## 2018-01-14 DIAGNOSIS — L8990 Pressure ulcer of unspecified site, unspecified stage: Secondary | ICD-10-CM

## 2018-01-14 DIAGNOSIS — C159 Malignant neoplasm of esophagus, unspecified: Secondary | ICD-10-CM | POA: Diagnosis not present

## 2018-01-14 DIAGNOSIS — J8410 Pulmonary fibrosis, unspecified: Secondary | ICD-10-CM

## 2018-01-14 DIAGNOSIS — J849 Interstitial pulmonary disease, unspecified: Secondary | ICD-10-CM | POA: Diagnosis not present

## 2018-01-14 DIAGNOSIS — J189 Pneumonia, unspecified organism: Secondary | ICD-10-CM | POA: Diagnosis present

## 2018-01-14 DIAGNOSIS — R0602 Shortness of breath: Secondary | ICD-10-CM | POA: Diagnosis not present

## 2018-01-14 DIAGNOSIS — I493 Ventricular premature depolarization: Secondary | ICD-10-CM | POA: Diagnosis present

## 2018-01-14 DIAGNOSIS — I272 Pulmonary hypertension, unspecified: Secondary | ICD-10-CM | POA: Diagnosis present

## 2018-01-14 HISTORY — DX: Malignant (primary) neoplasm, unspecified: C80.1

## 2018-01-14 HISTORY — DX: Heart failure, unspecified: I50.9

## 2018-01-14 HISTORY — DX: Anemia, unspecified: D64.9

## 2018-01-14 HISTORY — DX: Dyspnea, unspecified: R06.00

## 2018-01-14 LAB — CBC WITH DIFFERENTIAL/PLATELET
BASOS PCT: 0 %
Basophils Absolute: 0 10*3/uL (ref 0.0–0.1)
EOS ABS: 0 10*3/uL (ref 0.0–0.7)
Eosinophils Relative: 0 %
HCT: 32.3 % — ABNORMAL LOW (ref 39.0–52.0)
Hemoglobin: 10.1 g/dL — ABNORMAL LOW (ref 13.0–17.0)
Lymphocytes Relative: 9 %
Lymphs Abs: 1.1 10*3/uL (ref 0.7–4.0)
MCH: 23.7 pg — ABNORMAL LOW (ref 26.0–34.0)
MCHC: 31.3 g/dL (ref 30.0–36.0)
MCV: 75.6 fL — ABNORMAL LOW (ref 78.0–100.0)
MONO ABS: 1 10*3/uL (ref 0.1–1.0)
Monocytes Relative: 8 %
NEUTROS ABS: 10.2 10*3/uL — AB (ref 1.7–7.7)
Neutrophils Relative %: 83 %
Platelets: 398 10*3/uL (ref 150–400)
RBC: 4.27 MIL/uL (ref 4.22–5.81)
RDW: 24 % — AB (ref 11.5–15.5)
WBC: 12.3 10*3/uL — ABNORMAL HIGH (ref 4.0–10.5)

## 2018-01-14 LAB — URINALYSIS, ROUTINE W REFLEX MICROSCOPIC
BILIRUBIN URINE: NEGATIVE
Glucose, UA: NEGATIVE mg/dL
HGB URINE DIPSTICK: NEGATIVE
KETONES UR: NEGATIVE mg/dL
Leukocytes, UA: NEGATIVE
NITRITE: NEGATIVE
PROTEIN: NEGATIVE mg/dL
Specific Gravity, Urine: 1.008 (ref 1.005–1.030)
pH: 6 (ref 5.0–8.0)

## 2018-01-14 LAB — COMPREHENSIVE METABOLIC PANEL
ALBUMIN: 1.8 g/dL — AB (ref 3.5–5.0)
ALK PHOS: 169 U/L — AB (ref 38–126)
ALT: 25 U/L (ref 0–44)
AST: 43 U/L — ABNORMAL HIGH (ref 15–41)
Anion gap: 11 (ref 5–15)
BUN: 21 mg/dL (ref 8–23)
CALCIUM: 8.2 mg/dL — AB (ref 8.9–10.3)
CO2: 22 mmol/L (ref 22–32)
CREATININE: 1.25 mg/dL — AB (ref 0.61–1.24)
Chloride: 98 mmol/L (ref 98–111)
GFR calc Af Amer: >60 mL/min (ref >60)
GFR calc non Af Amer: 57 mL/min — ABNORMAL LOW (ref >60)
GLUCOSE: 174 mg/dL — AB (ref 70–99)
Potassium: 4.4 mmol/L (ref 3.5–5.1)
SODIUM: 131 mmol/L — AB (ref 135–145)
Total Bilirubin: 0.9 mg/dL (ref 0.3–1.2)
Total Protein: 5.9 g/dL — ABNORMAL LOW (ref 6.5–8.1)

## 2018-01-14 LAB — ECHOCARDIOGRAM COMPLETE
HEIGHTINCHES: 72 in
WEIGHTICAEL: 3328 [oz_av]

## 2018-01-14 LAB — LACTIC ACID, PLASMA
LACTIC ACID, VENOUS: 1.6 mmol/L (ref 0.5–1.9)
Lactic Acid, Venous: 1.7 mmol/L (ref 0.5–1.9)

## 2018-01-14 LAB — I-STAT ARTERIAL BLOOD GAS, ED
ACID-BASE DEFICIT: 4 mmol/L — AB (ref 0.0–2.0)
Bicarbonate: 18.8 mmol/L — ABNORMAL LOW (ref 20.0–28.0)
O2 SAT: 97 %
PH ART: 7.447 (ref 7.350–7.450)
TCO2: 20 mmol/L — ABNORMAL LOW (ref 22–32)
pCO2 arterial: 27.4 mmHg — ABNORMAL LOW (ref 32.0–48.0)
pO2, Arterial: 88 mmHg (ref 83.0–108.0)

## 2018-01-14 LAB — MRSA PCR SCREENING: MRSA by PCR: NEGATIVE

## 2018-01-14 LAB — I-STAT CHEM 8, ED
BUN: 33 mg/dL — ABNORMAL HIGH (ref 8–23)
CHLORIDE: 97 mmol/L — AB (ref 98–111)
CREATININE: 1.1 mg/dL (ref 0.61–1.24)
Calcium, Ion: 1.06 mmol/L — ABNORMAL LOW (ref 1.15–1.40)
GLUCOSE: 167 mg/dL — AB (ref 70–99)
HCT: 34 % — ABNORMAL LOW (ref 39.0–52.0)
HEMOGLOBIN: 11.6 g/dL — AB (ref 13.0–17.0)
Potassium: 5.6 mmol/L — ABNORMAL HIGH (ref 3.5–5.1)
Sodium: 129 mmol/L — ABNORMAL LOW (ref 135–145)
TCO2: 24 mmol/L (ref 22–32)

## 2018-01-14 LAB — I-STAT TROPONIN, ED: TROPONIN I, POC: 0.08 ng/mL (ref 0.00–0.08)

## 2018-01-14 LAB — I-STAT CG4 LACTIC ACID, ED
LACTIC ACID, VENOUS: 2.31 mmol/L — AB (ref 0.5–1.9)
LACTIC ACID, VENOUS: 2.61 mmol/L — AB (ref 0.5–1.9)

## 2018-01-14 LAB — TROPONIN I
Troponin I: 0.07 ng/mL (ref <0.03)
Troponin I: 0.1 ng/mL (ref <0.03)
Troponin I: 0.09 ng/mL (ref <0.03)

## 2018-01-14 LAB — CBG MONITORING, ED: Glucose-Capillary: 110 mg/dL — ABNORMAL HIGH (ref 70–99)

## 2018-01-14 LAB — GLUCOSE, CAPILLARY
Glucose-Capillary: 129 mg/dL — ABNORMAL HIGH (ref 70–99)
Glucose-Capillary: 141 mg/dL — ABNORMAL HIGH (ref 70–99)

## 2018-01-14 LAB — BRAIN NATRIURETIC PEPTIDE: B NATRIURETIC PEPTIDE 5: 781.3 pg/mL — AB (ref 0.0–100.0)

## 2018-01-14 MED ORDER — CLOPIDOGREL BISULFATE 75 MG PO TABS
75.0000 mg | ORAL_TABLET | Freq: Every day | ORAL | Status: DC
  Administered 2018-01-14 – 2018-01-27 (×14): 75 mg via ORAL
  Filled 2018-01-14 (×14): qty 1

## 2018-01-14 MED ORDER — INSULIN ASPART 100 UNIT/ML ~~LOC~~ SOLN
0.0000 [IU] | Freq: Three times a day (TID) | SUBCUTANEOUS | Status: DC
  Administered 2018-01-14 – 2018-01-15 (×3): 1 [IU] via SUBCUTANEOUS
  Administered 2018-01-16: 2 [IU] via SUBCUTANEOUS
  Administered 2018-01-16: 3 [IU] via SUBCUTANEOUS
  Administered 2018-01-17: 1 [IU] via SUBCUTANEOUS
  Administered 2018-01-18: 3 [IU] via SUBCUTANEOUS
  Administered 2018-01-18: 2 [IU] via SUBCUTANEOUS

## 2018-01-14 MED ORDER — ACETAMINOPHEN 325 MG PO TABS
650.0000 mg | ORAL_TABLET | Freq: Four times a day (QID) | ORAL | Status: DC | PRN
  Administered 2018-01-16: 650 mg via ORAL
  Filled 2018-01-14: qty 2

## 2018-01-14 MED ORDER — ATORVASTATIN CALCIUM 40 MG PO TABS
40.0000 mg | ORAL_TABLET | Freq: Every day | ORAL | Status: DC
  Administered 2018-01-14 – 2018-01-26 (×12): 40 mg via ORAL
  Filled 2018-01-14 (×12): qty 1

## 2018-01-14 MED ORDER — AMIODARONE HCL 200 MG PO TABS
200.0000 mg | ORAL_TABLET | Freq: Every day | ORAL | Status: DC
  Administered 2018-01-14 – 2018-01-17 (×4): 200 mg via ORAL
  Filled 2018-01-14 (×4): qty 1

## 2018-01-14 MED ORDER — INSULIN GLARGINE 100 UNIT/ML ~~LOC~~ SOLN: 18.0000 [IU] | Freq: Every day | SUBCUTANEOUS | Status: DC

## 2018-01-14 MED ORDER — FUROSEMIDE 10 MG/ML IJ SOLN
40.0000 mg | Freq: Once | INTRAMUSCULAR | Status: AC
  Administered 2018-01-14: 40 mg via INTRAVENOUS
  Filled 2018-01-14: qty 4

## 2018-01-14 MED ORDER — CARVEDILOL 3.125 MG PO TABS
3.1250 mg | ORAL_TABLET | Freq: Two times a day (BID) | ORAL | Status: DC
  Administered 2018-01-14 – 2018-01-27 (×25): 3.125 mg via ORAL
  Filled 2018-01-14 (×26): qty 1

## 2018-01-14 MED ORDER — GABAPENTIN 300 MG PO CAPS
300.0000 mg | ORAL_CAPSULE | Freq: Two times a day (BID) | ORAL | Status: DC
  Administered 2018-01-14 – 2018-01-27 (×27): 300 mg via ORAL
  Filled 2018-01-14 (×27): qty 1

## 2018-01-14 MED ORDER — APIXABAN 5 MG PO TABS
5.0000 mg | ORAL_TABLET | Freq: Two times a day (BID) | ORAL | Status: DC
  Administered 2018-01-14 – 2018-01-27 (×27): 5 mg via ORAL
  Filled 2018-01-14 (×27): qty 1

## 2018-01-14 MED ORDER — FUROSEMIDE 10 MG/ML IJ SOLN
40.0000 mg | Freq: Two times a day (BID) | INTRAMUSCULAR | Status: DC
  Administered 2018-01-14 – 2018-01-16 (×4): 40 mg via INTRAVENOUS
  Filled 2018-01-14 (×4): qty 4

## 2018-01-14 MED ORDER — POLYETHYLENE GLYCOL 3350 17 G PO PACK: 17.0000 g | PACK | Freq: Every day | ORAL | Status: DC | PRN

## 2018-01-14 MED ORDER — PANTOPRAZOLE SODIUM 40 MG PO TBEC
40.0000 mg | DELAYED_RELEASE_TABLET | Freq: Every day | ORAL | Status: DC
  Administered 2018-01-14 – 2018-01-27 (×14): 40 mg via ORAL
  Filled 2018-01-14 (×14): qty 1

## 2018-01-14 MED ORDER — ACETAMINOPHEN 650 MG RE SUPP: 650.0000 mg | Freq: Four times a day (QID) | RECTAL | Status: DC | PRN

## 2018-01-14 MED ORDER — SUCRALFATE 1 G PO TABS
1.0000 g | ORAL_TABLET | Freq: Three times a day (TID) | ORAL | Status: DC
  Administered 2018-01-14 – 2018-01-27 (×51): 1 g via ORAL
  Filled 2018-01-14 (×54): qty 1

## 2018-01-14 NOTE — Telephone Encounter (Signed)
New Message   Pt's wife calling, states the pt is in the hospital and they are saying he has heart failure. They want Dr. Tamala Julian to be aware. Please call

## 2018-01-14 NOTE — Progress Notes (Signed)
Came to room to check on pt, pt was on 3 lpm [place] w/ Sat 85-86%. Increased [place] to 6 lpm, sat improved to 90% (varying between 88-90%).  Spoke w/ RN and was going to trail HFNC.  Once back to room with equipment to place pt on HFNC, found pt on NRB & RN in room.  Per RN, pt desat to 70% w/ good wave form.  Pt remains on NRB for now.  Pt denies feeling any SOB during desat episode, no distress noted/no increased WOB currently noted.  Sat now 92%.

## 2018-01-14 NOTE — Telephone Encounter (Signed)
LVM with pt's wife to let her know I would forward her message to Dr Tamala Julian to be aware pt is back in the hospital.

## 2018-01-14 NOTE — ED Triage Notes (Signed)
Patient arrived with EMS from home , he slid from bed while using his urinal this morning , denies injury/no LOC , EMS reported pt.'s O2 sat= 68% room air , placed on NRB mask - O2 sat improved to 98% , pt. denies SOB or chest discomfort , bilateral lower legs/feet edema . Occasional dry cough .

## 2018-01-14 NOTE — Plan of Care (Signed)
Problem: Education: Goal: Knowledge of General Education information will improve Description Including pain rating scale, medication(s)/side effects and non-pharmacologic comfort measures Outcome: Progressing   Problem: Health Behavior/Discharge Planning: Goal: Ability to manage health-related needs will improve Outcome: Progressing   Problem: Clinical Measurements: Goal: Respiratory complications will improve Outcome: Progressing Goal: Cardiovascular complication will be avoided Outcome: Progressing   Problem: Activity: Goal: Risk for activity intolerance will decrease Outcome: Progressing   Problem: Nutrition: Goal: Adequate nutrition will be maintained Outcome: Progressing   Problem: Coping: Goal: Level of anxiety will decrease Outcome: Progressing   Problem: Elimination: Goal: Will not experience complications related to bowel motility Outcome: Progressing Goal: Will not experience complications related to urinary retention Outcome: Progressing   Problem: Safety: Goal: Ability to remain free from injury will improve Outcome: Progressing   Problem: Skin Integrity: Goal: Risk for impaired skin integrity will decrease Outcome: Progressing

## 2018-01-14 NOTE — ED Notes (Signed)
MD advised of patient [place]

## 2018-01-14 NOTE — ED Notes (Signed)
Patient placed on 3L [place] Per MD

## 2018-01-14 NOTE — ED Provider Notes (Signed)
[facility] EMERGENCY DEPARTMENT Provider Note   CSN: [id number] Arrival date & time: 01/14/18  4627     History   Chief Complaint Chief Complaint  Patient presents with  . Fall    Low O2 Sat / CHF    HPI Justin Rueth. is a 67 y.o. male.  The history is provided by the patient and the EMS personnel. No language interpreter was used.  Fall    Justin Weil. is a 67 y.o. male who presents to the Emergency Department complaining of fall. He presents to the emergency department for evaluation following a fall. He was sitting at the edge of the bed attempting to use urinal when he slips to the floor. On EMS arrival he was noted to have oxygen sets of 66%. He denies any fevers, chest pain, shortness of breath. He does endorse bilateral lower extremity edema for an unclear period of time. He has a history of CHF, atrial fibrillation, esophageal cancer s/p radiation treatments.  Sxs are moderate and constant in nature. Denies hematochezia or melena.  Past Medical History:  Diagnosis Date  . A-fib ([place])    with RVR  . Acute kidney injury ([place])   . Angina   . Arthritis    "hands" (08/10/2017)  . Atrial flutter, paroxysmal ([place])    arrived with aflutter with RVR, EF down to 25-30%, s/p TEE DCCV on 01/29/2015  . CHF (congestive heart failure) ([place])   . Chronic lower back pain    "all the time from my sciatic nerve"  . Coronary artery disease   . DVT (deep venous thrombosis) ([place])   . Erectile dysfunction   . GERD (gastroesophageal reflux disease)   . Heart failure ([place])   . Hypercholesteremia   . Hypertension   . 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 Myocardial infarction ([place]) 1988; ~0350;  ~1996;~ 2000  . Obesity   . Peripheral vascular disease ([place])   . Umbilical hernia    unrepaired    Patient Active Problem List   Diagnosis Date Noted  . CHF exacerbation ([place]) 01/14/2018  . Esophageal cancer ([place]) 12/20/2017  . DVT (deep  venous thrombosis) ([place]) 12/07/2017  . AKI (acute kidney injury) ([place]) 12/07/2017  . A-fib (with RVR) [place]) 12/06/2017  . Postural dizziness with presyncope 12/06/2017  . Encounter for antineoplastic chemotherapy 11/11/2017  . 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 Atrial flutter ([place]) 09/01/2017  . Elevated troponin   . Iron deficiency anemia   . Coronary artery disease involving coronary bypass graft of native heart with angina pectoris ([place]) 08/28/2016  . 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 Anticoagulated 02/06/2015  . Demand ischemia ([place]) 01/30/2015  . Chronic combined systolic and diastolic heart failure, NYHA class 2 ([place]) 04/17/2013  . Umbilical hernia   . Myocardial infarction ([place])   . Peripheral vascular disease ([place])   . Hypercholesteremia   . Hypertension 09/01/2011  . Diabetes mellitus ([place]) 09/01/2011    Past Surgical History:  Procedure Laterality Date  . CARDIOVERSION N/A 01/29/2015   Procedure: CARDIOVERSION;  Surgeon: [name], MD;  Location: [facility];  Service: Cardiovascular;  Laterality: N/A;  . CORONARY ANGIOPLASTY WITH STENT PLACEMENT  08/31/11   "2 today; makes total of 3 stents"  . CORONARY ANGIOPLASTY WITH STENT PLACEMENT  08/10/2017   "2 today; makes a total of 5 stents" (08/10/2017)  . CORONARY ARTERY BYPASS  GRAFT  1998; 2007   CABG X 4; CABG X4  . CORONARY STENT INTERVENTION N/A 08/10/2017   Procedure: CORONARY STENT INTERVENTION;  Surgeon: Sherren Mocha, MD;  Location: [facility] LAB;  Service: Cardiovascular;  Laterality: N/A;  . ESOPHAGOGASTRODUODENOSCOPY (EGD) WITH PROPOFOL N/A 09/01/2017   Procedure: ESOPHAGOGASTRODUODENOSCOPY (EGD) WITH PROPOFOL;  Surgeon: Laurence Spates, MD;  Location: [facility];  Service: Endoscopy;  Laterality: N/A;  . EUS N/A 09/29/2017   Procedure: UPPER ENDOSCOPIC ULTRASOUND (EUS) LINEAR;  Surgeon: Arta Silence, MD;  Location: [facility]  ENDOSCOPY;  Service: Endoscopy;  Laterality: N/A;  . LEFT HEART CATH AND CORS/GRAFTS ANGIOGRAPHY N/A 08/10/2017   Procedure: LEFT HEART CATH AND CORS/GRAFTS ANGIOGRAPHY;  Surgeon: Sherren Mocha, MD;  Location: [facility] CV LAB;  Service: Cardiovascular;  Laterality: N/A;  . LEFT HEART CATHETERIZATION WITH CORONARY/GRAFT ANGIOGRAM N/A 08/31/2011   Procedure: LEFT HEART CATHETERIZATION WITH Beatrix Fetters;  Surgeon: [name], MD;  Location: [facility] CATH LAB;  Service: Cardiovascular;  Laterality: N/A;  . PERIPHERAL VASCULAR CATHETERIZATION N/A 04/27/2016   Procedure: Abdominal Aortogram w/Lower Extremity;  Surgeon: Angelia Mould, MD;  Location: [facility] LAB;  Service: Cardiovascular;  Laterality: N/A;  . TEE WITHOUT CARDIOVERSION N/A 01/29/2015   Procedure: TRANSESOPHAGEAL ECHOCARDIOGRAM (TEE);  Surgeon: [name], MD;  Location: [facility] ENDOSCOPY;  Service: Cardiovascular;  Laterality: N/A;        Home Medications    Prior to Admission medications   Medication Sig Start Date End Date Taking? Authorizing Provider  acetaminophen (TYLENOL) 325 MG tablet Take 2 tablets (650 mg total) by mouth every 6 (six) hours as needed for mild pain or headache. 09/04/17   Erlene Quan, PA-C  amiodarone (PACERONE) 200 MG tablet Take 1 tablet (200 mg total) by mouth daily. 12/20/17   Belva Crome, MD  apixaban (ELIQUIS) 5 MG TABS tablet Take 5 mg by mouth 2 (two) times daily.    [provider]  atorvastatin (LIPITOR) 40 MG tablet Take 40 mg by mouth daily at 6 PM.  07/14/17   [provider]  carvedilol (COREG) 3.125 MG tablet Take 1 tablet (3.125 mg total) by mouth 2 (two) times daily with a meal. 12/10/17   Hongalgi, Lenis Dickinson, MD  clopidogrel (PLAVIX) 75 MG tablet Take 1 tablet (75 mg total) by mouth daily. Take for 6 months. Plan to stop on August 27th. Check with cardiologist at that time. 08/11/17   Daune Perch, NP  ferrous sulfate 325 (65 FE) MG tablet  Take 325 mg by mouth daily. 07/20/17   [provider]  gabapentin (NEURONTIN) 300 MG capsule Take 300 mg by mouth 2 (two) times daily. 07/09/17   [provider]  insulin glargine (LANTUS) 100 UNIT/ML injection Inject 25 Units into the skin daily.    [provider]  nitroGLYCERIN (NITROSTAT) 0.4 MG SL tablet Place 1 tablet (0.4 mg total) under the tongue every 5 (five) minutes as needed for chest pain ([place]). 06/18/14 08/09/18  Belva Crome, MD  pantoprazole (PROTONIX) 40 MG tablet Take 1 tablet (40 mg total) by mouth daily. 09/05/17   Erlene Quan, PA-C  prochlorperazine (COMPAZINE) 10 MG tablet Take 1 tablet (10 mg total) by mouth every 6 (six) hours as needed for nausea or vomiting. 10/14/17   Wyatt Portela, MD  sucralfate (CARAFATE) 1 g tablet Take 1 tablet (1 g total) by mouth 4 (four) times daily -  with meals and at bedtime. 5 min before meals  for radiation induced esophagitis 11/22/17   Hayden Pedro, PA-C    Family History Family History  Problem Relation Age of Onset  . Heart disease Mother   . Heart attack Mother   . Diabetes Mother   . Heart disease Father   . Heart attack Father   . Diabetes Father   . Heart attack Brother   . Stroke Neg Hx     Social History Social History   Tobacco Use  . Smoking status: Former Smoker    Packs/day: 2.00    Years: 6.00    Pack years: 12.00    Types: Cigarettes    Last attempt to quit: 06/16/1971    Years since quitting: 46.6  . Smokeless tobacco: Never Used  . Tobacco comment: "chewed on cigars; never really did chew tobacco"  Substance Use Topics  . Alcohol use: No  . Drug use: No     Allergies   Patient has no known allergies.   Review of Systems Review of Systems  All other systems reviewed and are negative.    Physical Exam Updated Vital Signs BP (!) 157/94   Pulse 72   Temp 99.2 F (37.3 C) (Oral)   Resp (!) 27   Ht 6' (1.829 m)   Wt 94.3 kg (208 lb)   SpO2 100%   BMI  28.21 kg/m   Physical Exam  Constitutional: He is oriented to person, place, and time. He appears well-developed and well-nourished.  HENT:  Head: Normocephalic and atraumatic.  Cardiovascular: Normal rate and regular rhythm.  No murmur heard. Pulmonary/Chest: Effort normal. No respiratory distress.  Diffuse fine crackles  Abdominal: Soft. There is no tenderness. There is no rebound and no guarding.  Musculoskeletal:  3-4 + pitting edema to BLE.   Neurological: He is alert and oriented to person, place, and time.  Skin: Skin is warm and dry. There is pallor.  Psychiatric: He has a normal mood and affect. His behavior is normal.  Nursing note and vitals reviewed.    ED Treatments / Results  Labs (all labs ordered are listed, but only abnormal results are displayed) Labs Reviewed  COMPREHENSIVE METABOLIC PANEL - Abnormal; Notable for the following components:      Result Value   Sodium 131 (*)    Glucose, Bld 174 (*)    Creatinine, Ser 1.25 (*)    Calcium 8.2 (*)    Total Protein 5.9 (*)    Albumin 1.8 (*)    AST 43 (*)    Alkaline Phosphatase 169 (*)    GFR calc non Af Amer 57 (*)    All other components within normal limits  CBC WITH DIFFERENTIAL/PLATELET - Abnormal; Notable for the following components:   WBC 12.3 (*)    Hemoglobin 10.1 (*)    HCT 32.3 (*)    MCV 75.6 (*)    MCH 23.7 (*)    RDW 24.0 (*)    Neutro Abs 10.2 (*)    All other components within normal limits  BRAIN NATRIURETIC PEPTIDE - Abnormal; Notable for the following components:   B Natriuretic Peptide 781.3 (*)    All other components within normal limits  I-STAT CHEM 8, ED - Abnormal; Notable for the following components:   Sodium 129 (*)    Potassium 5.6 (*)    Chloride 97 (*)    BUN 33 (*)    Glucose, Bld 167 (*)    Calcium, Ion 1.06 (*)    Hemoglobin 11.6 (*)  HCT 34.0 (*)    All other components within normal limits  I-STAT CG4 LACTIC ACID, ED - Abnormal; Notable for the following  components:   Lactic Acid, Venous 2.31 (*)    All other components within normal limits  I-STAT ARTERIAL BLOOD GAS, ED - Abnormal; Notable for the following components:   pCO2 arterial 27.4 (*)    Bicarbonate 18.8 (*)    TCO2 20 (*)    Acid-base deficit 4.0 (*)    All other components within normal limits  URINALYSIS, ROUTINE W REFLEX MICROSCOPIC  TROPONIN I  TROPONIN I  TROPONIN I  I-STAT TROPONIN, ED  I-STAT CG4 LACTIC ACID, ED    EKG EKG Interpretation  Date/Time:  Friday January 14 2018 07:07:09 EDT Ventricular Rate:  84 PR Interval:    QRS Duration: 122 QT Interval:  386 QTC Calculation: 456 R Axis:   -106 Text Interpretation:  Atrial fibrillation Right superior axis deviation RSR' or QR pattern in V1 suggests right ventricular conduction delay Anteroseptal infarct , age undetermined Abnormal ECG Confirmed by Quintella Reichert [phone]) on 01/14/2018 7:17:44 AM   Radiology Dg Chest Port 1 View  Result Date: 01/14/2018 CLINICAL DATA:  Patient fell from bed. Low O2 sats. Occasional cough. EXAM: PORTABLE CHEST 1 VIEW COMPARISON:  CT 01/06/2018. FINDINGS: Prior CABG. Cardiomegaly. Diffuse bilateral pulmonary infiltrates most consistent pulmonary edema. Bilateral pneumonia cannot be excluded. Small right pleural effusion. No pneumothorax. No acute bony abnormality identified. IMPRESSION: Prior CABG. Cardiomegaly with diffuse bilateral pulmonary infiltrates most consistent with pulmonary edema. Small right pleural effusion. Electronically Signed   By: Marcello Moores  Register   On: 01/14/2018 07:45    Procedures Procedures (including critical care time)  Medications Ordered in ED Medications  furosemide (LASIX) injection 40 mg (40 mg Intravenous Given 01/14/18 0819)     Initial Impression / Assessment and Plan / ED Course  I have reviewed the triage vital signs and the nursing notes.  Pertinent labs & imaging results that were available during my care of the patient were reviewed by me  and considered in my medical decision making (see chart for details).     Patient here for evaluation following a fall, noted to be hypoxic on EMS arrival. Patient denies any shortness of breath or chest pain. Chest x-ray and exam consistent with pulmonary edema, will treat with Lasix for diuresis. Current presentation is not consistent with pneumonia or PE. Internal medicine teaching service consulted for admission. Cardiology consulted regarding CHF exacerbation. Patient and family updated findings of studies and recommendation for admission and they are in agreement with treatment plan. Final Clinical Impressions(s) / ED Diagnoses   Final diagnoses:  Acute pulmonary edema ([place])  Acute congestive heart failure, unspecified heart failure type [facility])    ED Discharge Orders    None       Quintella Reichert, MD 01/14/18 0945

## 2018-01-14 NOTE — ED Notes (Signed)
Pt given sandwich bag and graham crackers with cup of water to eat and drink. Pt placed on 6L [place] during this time, pt's spo2 remained in upper 80s, then placed back on NRB at 12L, spo2 back up to 97%. Pt appeared to be in NAD at any time while eating. Tolerated well.

## 2018-01-14 NOTE — Consult Note (Addendum)
Cardiology Consultation:   Patient ID: Justin Blackburn.; [id number]; 1951-03-04   Admit date: 01/14/2018 Date of Consult: 01/14/2018  Primary Care Provider: [name], PA-C Primary Cardiologist: [name], MD   Patient Profile:   Justin Blackburn. is a 67 y.o. male with a hx of CAD s/p CABG 1998 with redo 2007 and stent to left main in 08/2017, distal esophageal adenocarcinoma on radiation and chemo, paroxysmal atrial flutter on Eliquis, cardiomyopathy with EF 25-30% 01/2015 45-50% 09/01/2017, hypertension, LLE DVT 12/02/2017, hypercholesterolemia, type 2 diabetes on insulin, PVD, obesity, GERD and chronic low back pain who is being seen today for the evaluation of CHF at the request of Dr. Beryle Beams.  History of Present Illness:   Mr. Shin has a history of distal esophageal adenocarcinoma, by EUS staging T3 on radiation and chemotherapy, weekly carboplatin and paclitaxel, has completed 4 cycles, the last course being dropped due to low platelet count.  He has been noted to have some improvement in swallowing since radiation.  He was admitted 12/06/17-12/10/17 for increased swelling especially of the left leg, near syncope, DVT of the left leg, afib.  He converted to sinus rhythm on amiodarone and was continued on Eliquis and Plavix, but no aspirin.  He followed up in the office on 12/20/2017 with Dr. Tamala Julian and was maintaining sinus rhythm.  He had no obvious volume overload at that time.  He had continued left lower extremity edema secondary to DVT.  Mr. Voit presented to the hospital today with complaints of progressive weakness, significantly worse over the past 2-3 days.  He apparently slid off the bed while using the urinal to the floor prompting family to call EMS.  On EMS arrival he was found to be hypoxic with O2 sat of 69% was placed on nonrebreather with O2 sat improving to 98%.  He has a cough which is worse when laying flat and improved with sitting up.  He has ongoing left lower  extremity edema related to DVT a month ago and now has new swelling in the right lower extremity that has been progressive.  In the ED he was noted to have rails throughout his lungs.  Continued on oxygen via nonrebreather.  Upon my exam he continues to have crackles in the lower half of his lungs, slightly worse on the left.  He is in atrial fibrillation most of the time with occasional sinus rhythm with frequent PACs and PVCs.  He denies any chest discomfort.  He denies shortness of breath or orthopnea to me.  He does have 1-2+ lower extremity edema, left greater than right, up to the knees.  His wife says that the left leg has been swollen since the DVT was found and it has a faint reminiscence of cellulitis which is greatly improved based on the picture that she shows me on her phone.  His wife says that the right lower extremity edema is new within the past few days.  The patient is not currently on a diuretic at home.  Apparently he had lost almost 40 pounds during treatment for esophageal cancer and he was experiencing dehydration and his diuretic was stopped about a month ago.  Looks like his Eliquis was stopped in March when found to be anemic and his esophageal cancer was found. I think he was off anticoagulation at time of his DVT in June and then the Eliquis was restarted.   Pertinent findings: BNP 781.3 Troponins 0.08, 0.07, 0.10 Serum creatinine 1.25 Chest  x-ray shows diffuse bilateral pulmonary infiltrates most consistent with pulmonary edema, small right pleural effusion. EKG Atrial fibrillation, 84 bpm, Right superior axis deviation, RSR' or QR pattern in V1 suggests right ventricular conduction delay  Past Medical History:  Diagnosis Date  . A-fib ([place])    with RVR  . Acute kidney injury ([place])   . Angina   . Arthritis    "hands" (08/10/2017)  . Atrial flutter, paroxysmal ([place])    arrived with aflutter with RVR, EF down to 25-30%, s/p TEE DCCV on 01/29/2015  . CHF (congestive  heart failure) ([place])   . Chronic lower back pain    "all the time from my sciatic nerve"  . Coronary artery disease   . DVT (deep venous thrombosis) ([place])   . Erectile dysfunction   . GERD (gastroesophageal reflux disease)   . Heart failure ([place])   . Hypercholesteremia   . Hypertension   . 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 Myocardial infarction ([place]) 1988; ~4403;  ~1996;~ 2000  . Obesity   . Peripheral vascular disease ([place])   . Umbilical hernia    unrepaired    Past Surgical History:  Procedure Laterality Date  . CARDIOVERSION N/A 01/29/2015   Procedure: CARDIOVERSION;  Surgeon: [name], MD;  Location: [place];  Service: Cardiovascular;  Laterality: N/A;  . CORONARY ANGIOPLASTY WITH STENT PLACEMENT  08/31/11   "2 today; makes total of 3 stents"  . CORONARY ANGIOPLASTY WITH STENT PLACEMENT  08/10/2017   "2 today; makes a total of 5 stents" (08/10/2017)  . CORONARY ARTERY BYPASS GRAFT  1998; 2007   CABG X 4; CABG X4  . CORONARY STENT INTERVENTION N/A 08/10/2017   Procedure: CORONARY STENT INTERVENTION;  Surgeon: Sherren Mocha, MD;  Location: [place] [facility];  Service: Cardiovascular;  Laterality: N/A;  . ESOPHAGOGASTRODUODENOSCOPY (EGD) WITH PROPOFOL N/A 09/01/2017   Procedure: ESOPHAGOGASTRODUODENOSCOPY (EGD) WITH PROPOFOL;  Surgeon: Laurence Spates, MD;  Location: [place];  Service: Endoscopy;  Laterality: N/A;  . EUS N/A 09/29/2017   Procedure: UPPER ENDOSCOPIC ULTRASOUND (EUS) LINEAR;  Surgeon: Arta Silence, MD;  Location: [facility];  Service: Endoscopy;  Laterality: N/A;  . LEFT HEART CATH AND CORS/GRAFTS ANGIOGRAPHY N/A 08/10/2017   Procedure: LEFT HEART CATH AND CORS/GRAFTS ANGIOGRAPHY;  Surgeon: Sherren Mocha, MD;  Location: [place] [facility];  Service: Cardiovascular;  Laterality: N/A;  . LEFT HEART CATHETERIZATION WITH CORONARY/GRAFT ANGIOGRAM N/A 08/31/2011   Procedure: LEFT HEART CATHETERIZATION WITH Beatrix Fetters;  Surgeon: [name], MD;  Location: [facility] CATH LAB;  Service: Cardiovascular;  Laterality: N/A;  . PERIPHERAL VASCULAR CATHETERIZATION N/A 04/27/2016   Procedure: Abdominal Aortogram w/Lower Extremity;  Surgeon: Angelia Mould, MD;  Location: [place] [facility];  Service: Cardiovascular;  Laterality: N/A;  . TEE WITHOUT CARDIOVERSION N/A 01/29/2015   Procedure: TRANSESOPHAGEAL ECHOCARDIOGRAM (TEE);  Surgeon: [name], MD;  Location: [facility] ENDOSCOPY;  Service: Cardiovascular;  Laterality: N/A;     Home Medications:  Prior to Admission medications   Medication Sig Start Date End Date Taking? Authorizing Provider  acetaminophen (TYLENOL) 325 MG tablet Take 2 tablets (650 mg total) by mouth every 6 (six) hours as needed for mild pain or headache. 09/04/17  Yes Kilroy, Doreene Burke, PA-C  amiodarone (PACERONE) 200 MG tablet Take 1 tablet (200 mg total) by mouth daily. Patient taking differently: Take 200 mg by mouth 2 (two) times daily.  12/20/17  Yes Belva Crome, MD  apixaban Arne Cleveland) 5  MG TABS tablet Take 5 mg by mouth 2 (two) times daily.   Yes [provider]  atorvastatin (LIPITOR) 40 MG tablet Take 40 mg by mouth daily at 6 PM.  07/14/17  Yes [provider]  candesartan (ATACAND) 16 MG tablet Take 16 mg by mouth daily.   Yes [provider]  carvedilol (COREG) 3.125 MG tablet Take 1 tablet (3.125 mg total) by mouth 2 (two) times daily with a meal. 12/10/17  Yes Hongalgi, Lenis Dickinson, MD  clopidogrel (PLAVIX) 75 MG tablet Take 1 tablet (75 mg total) by mouth daily. Take for 6 months. Plan to stop on August 27th. Check with cardiologist at that time. 08/11/17  Yes Daune Perch, NP  diltiazem (DILACOR XR) 120 MG 24 hr capsule Take 120 mg by mouth daily.   Yes [provider]  ferrous sulfate 325 (65 FE) MG tablet Take 325 mg by mouth daily. 07/20/17  Yes [provider]  gabapentin (NEURONTIN) 300 MG capsule Take 300 mg by mouth 2 (two)  times daily. 07/09/17  Yes [provider]  nitroGLYCERIN (NITROSTAT) 0.4 MG SL tablet Place 1 tablet (0.4 mg total) under the tongue every 5 (five) minutes as needed for chest pain (Angina). 06/18/14 08/09/18 Yes Belva Crome, MD  pantoprazole (PROTONIX) 40 MG tablet Take 1 tablet (40 mg total) by mouth daily. 09/05/17  Yes Erlene Quan, PA-C    Inpatient Medications: Scheduled Meds: . amiodarone  200 mg Oral Daily  . apixaban  5 mg Oral BID  . atorvastatin  40 mg Oral q1800  . carvedilol  3.125 mg Oral BID WC  . clopidogrel  75 mg Oral Daily  . furosemide  40 mg Intravenous BID  . gabapentin  300 mg Oral BID  . insulin aspart  0-9 Units Subcutaneous TID WC  . pantoprazole  40 mg Oral Daily  . sucralfate  1 g Oral TID WC & HS   Continuous Infusions:  PRN Meds: acetaminophen **OR** acetaminophen, polyethylene glycol  Allergies:   No Known Allergies  Social History:   Social History   Socioeconomic History  . Marital status: Married    Spouse name: Not on file  . Number of children: Not on file  . Years of education: Not on file  . Highest education level: Not on file  Occupational History  . Not on file  Social Needs  . Financial resource strain: Not on file  . Food insecurity:    Worry: Not on file    Inability: Not on file  . Transportation needs:    Medical: Not on file    Non-medical: Not on file  Tobacco Use  . Smoking status: Former Smoker    Packs/day: 2.00    Years: 6.00    Pack years: 12.00    Types: Cigarettes    Last attempt to quit: 06/16/1971    Years since quitting: 46.6  . Smokeless tobacco: Never Used  . Tobacco comment: "chewed on cigars; never really did chew tobacco"  Substance and Sexual Activity  . Alcohol use: No  . Drug use: No  . Sexual activity: Never  Lifestyle  . Physical activity:    Days per week: Not on file    Minutes per session: Not on file  . Stress: Not on file  Relationships  . Social connections:    Talks on  phone: Not on file    Gets together: Not on file    Attends religious service: Not on  file    Active member of club or organization: Not on file    Attends meetings of clubs or organizations: Not on file    Relationship status: Not on file  . Intimate partner violence:    Fear of current or ex partner: Not on file    Emotionally abused: Not on file    Physically abused: Not on file    Forced sexual activity: Not on file  Other Topics Concern  . Not on file  Social History Narrative  . Not on file    Family History:    Family History  Problem Relation Age of Onset  . Heart disease Mother   . Heart attack Mother   . Diabetes Mother   . Heart disease Father   . Heart attack Father   . Diabetes Father   . Heart attack Brother   . Stroke Neg Hx      ROS:  Please see the history of present illness.   All other ROS reviewed and negative.     Physical Exam/Data:   Vitals:   01/14/18 1215 01/14/18 1245 01/14/18 1314 01/14/18 1330  BP: (!) 157/89 (!) 141/84  (!) 156/83  Pulse: 77 74 97 85  Resp: (!) 21 (!) 26 (!) 27 20  Temp:      TempSrc:      SpO2: 97%  97% 98%  Weight:      Height:        Intake/Output Summary (Last 24 hours) at 01/14/2018 1419 Last data filed at 01/14/2018 1212 Gross per 24 hour  Intake -  Output 1975 ml  Net -1975 ml   Filed Weights   01/14/18 0703  Weight: 208 lb (94.3 kg)   Body mass index is 28.21 kg/m.  General: Tonically ill-appearing elderly gentleman HEENT: normal, alopecia Lymph: no adenopathy Neck: no JVD Endocrine:  No thryomegaly Vascular: No carotid bruits;  Cardiac:  normal S1, S2; irregularly irregular; no murmur Lungs: Rales halfway up lungs, left greater than right Abd: soft, nontender, no hepatomegaly  Ext: 1-2+ pitting edema, L>R Musculoskeletal:  No deformities, generalized weakness Skin: warm and dry  Neuro:  CNs 2-12 intact, no focal abnormalities noted Psych:  Normal affect   EKG:  The EKG was personally  reviewed and demonstrates:  Atrial fibrillation, 84 bpm, Right superior axis deviation, RSR' or QR pattern in V1 suggests right ventricular conduction delay Telemetry:  Telemetry was personally reviewed and demonstrates:  Sinus rhythm with frequent PAC's and PVC's alternating with frequent atrial fib with rates in the 70's  Relevant CV Studies:  LHC/CORONARY STENT INTERVENTION 08/10/17    Conclusion    Ost LAD lesion is 100% stenosed.  Prox RCA lesion is 100% stenosed.  Ost LM to Dist LM lesion is 75% stenosed.  Prox Cx to Mid Cx lesion is 25% stenosed.  Prox Cx lesion is 70% stenosed.  LIMA graft was visualized by angiography and is normal in caliber.  The graft exhibits no disease.  RIMA graft was visualized by non-selective angiography.  A drug-eluting stent was successfully placed using a STENT SYNERGY DES 3X24.  Post intervention, there is a 0% residual stenosis.  Post intervention, there is a 0% residual stenosis.  A drug-eluting stent was successfully placed.  Post intervention, there is a 0% residual stenosis.   1. Severe native 3 vessel CAD with total occlusion of the RCA and LAD, severe stenosis of the native left main and native circumflex 2. Successful PCI of the left main  and LCx using DES platforms  Recommend: ASA 81 mg x 1 month, plavix 75 mg daily, and resume eliquis tomorrow    Echocardiogram 08/31/2017 Study Conclusions - Left ventricle: The cavity size was normal. There was mild   concentric hypertrophy. Systolic function was mildly reduced. The   estimated ejection fraction was in the range of 45% to 50%.   Hypokinesis of the basal-midinferior and inferoseptal myocardium. - Mitral valve: Calcified annulus. There was mild regurgitation   directed centrally. - Left atrium: The atrium was mildly to moderately dilated. - Right ventricle: The cavity size was mildly dilated. Wall   thickness was normal. Systolic function was moderately reduced. -  Right atrium: The atrium was mildly dilated.  Lower extremity Dopplers 12/07/2017 Right: No evidence of common femoral vein obstruction. Left: There is evidence of acute DVT in the Common Femoral vein, Femoral vein, Popliteal vein, Posterior Tibial veins, and Peroneal veins and the left external iliac vein.   Laboratory Data:  Chemistry Recent Labs  Lab 01/14/18 0707 01/14/18 0718  NA 131* 129*  K 4.4 5.6*  CL 98 97*  CO2 22  --   GLUCOSE 174* 167*  BUN 21 33*  CREATININE 1.25* 1.10  CALCIUM 8.2*  --   GFRNONAA 57*  --   GFRAA >60  --   ANIONGAP 11  --     Recent Labs  Lab 01/14/18 0707  PROT 5.9*  ALBUMIN 1.8*  AST 43*  ALT 25  ALKPHOS 169*  BILITOT 0.9   Hematology Recent Labs  Lab 01/14/18 0707 01/14/18 0718  WBC 12.3*  --   RBC 4.27  --   HGB 10.1* 11.6*  HCT 32.3* 34.0*  MCV 75.6*  --   MCH 23.7*  --   MCHC 31.3  --   RDW 24.0*  --   PLT 398  --    Cardiac Enzymes Recent Labs  Lab 01/14/18 0932 01/14/18 1227  TROPONINI 0.07* 0.10*    Recent Labs  Lab 01/14/18 0716  TROPIPOC 0.08    BNP Recent Labs  Lab 01/14/18 0707  BNP 781.3*    DDimer No results for input(s): DDIMER in the last 168 hours.  Radiology/Studies:  Dg Chest Port 1 View  Result Date: 01/14/2018 CLINICAL DATA:  Patient fell from bed. Low O2 sats. Occasional cough. EXAM: PORTABLE CHEST 1 VIEW COMPARISON:  CT 01/06/2018. FINDINGS: Prior CABG. Cardiomegaly. Diffuse bilateral pulmonary infiltrates most consistent pulmonary edema. Bilateral pneumonia cannot be excluded. Small right pleural effusion. No pneumothorax. No acute bony abnormality identified. IMPRESSION: Prior CABG. Cardiomegaly with diffuse bilateral pulmonary infiltrates most consistent with pulmonary edema. Small right pleural effusion. Electronically Signed   By: Marcello Moores  Register   On: 01/14/2018 07:45    Assessment and Plan:   Acute on chronic systolic heart failure -Patient presenting with weakness, hypoxia  and increased lower extremity edema with rales in the lungs.  He denies chest discomfort, shortness of breath or orthopnea but he has very activity intolerance at home.  Patient was on diuretic at one time but with his treatment for esophageal cancer and subsequent weight loss and dehydration it was stopped about a month ago.  Per his wife left lower extremity edema is residual after LLE DVT last month and right lower extremity edema is new over the past few days. -He appears quite volume overloaded. -He is still on non-rebreather oxygen mask.  -BNP 781.3 -Troponins 0.08, 0.07, 0.10.  Mildly elevated and flat pattern consistent with demand ischemia related  to heart failure -Serum creatinine 1.25 -Chest x-ray shows diffuse bilateral pulmonary infiltrates most consistent with pulmonary edema, small right pleural effusion. -He is being diuresed with 40 mg IV twice daily, has received 2 doses so far with good urine output, 1.9 L.  Has about 1000 mL of urine at his bedside, as of yet uncounted. -Weight at office on 12/20/2017 was 206 at time when patient appeared euvolemic.  Weight on admission was 208 pounds -Echocardiogram has just been done, pending results. -He is responding well to the current diuretic.  Continue current diuresis and monitor daily weights and strict intake and output. -Watch renal function. -Dr Harrell Gave to see.   Persistent atrial fibrillation -Patient hospitalized with A. fib with RVR in June and converted to sinus rhythm on amiodarone. -EKG on presentation shows atrial fibrillation, rate controlled -On Eliquis 5 mg twice daily for stroke risk reduction and also for DVT -He is currently in SR with frequent PVCs and PAC's and frequent atrial fibrillation, rate controlled -Continue amiodarone and beta-blocker, for now.  Continue to monitor on telemetry. If he is maintaining afib and especially if rate goes up, would consider DCCV if he has not missed any Eliquis doses.    CAD -s/p CABG 1998 with redo 2007 and stent to left main in 08/2017 -On Plavix, no aspirin due to anticoagulation, statin, beta-blocker  Hyperetension -Home medications include carvedilol 3.125 mg twice daily -Blood pressure has been mildly elevated 140s-150s/80s-90s, may improve with diuresis  Esophageal cancer -Status post radiation and chemo with carboplatin and paclitaxel.  Also follows with Dr. Servando Snare who is considering resection however he had noted improved swallowing after radiation therapy.  The patient notes that he is eating better recently.  Type 2 diabetes on insulin -Management per primary team.  On sliding scale.  History of left lower extremity DVT in 11/2017 -Per reports lower extremity edema, tenderness and erythema have improved -He continues on Eliquis 5 mg twice daily  Anemia -Hemoglobin down to 10.1 this admission compared to 12.4 on 12/10/2017.  May be delusional with his volume overload. -Being evaluated by primary team  For questions or updates, please contact [place] Please consult [email or web address] for contact info under Cardiology/STEMI.   Signed, Daune Perch, NP  01/14/2018 2:19 PM

## 2018-01-14 NOTE — ED Notes (Signed)
Patient placed back on NRB, O2 dropped to 75% EDP made aware.

## 2018-01-14 NOTE — Progress Notes (Signed)
  Echocardiogram 2D Echocardiogram has been performed.  Justin Blackburn 01/14/2018, 3:43 PM

## 2018-01-14 NOTE — H&P (Signed)
Date: 01/14/2018               Patient Name:  Justin Blackburn. MRN: [id number]  DOB: 1951/05/05 Age / Sex: 67 y.o., male   PCP: [name], PA-C         Medical Service: Internal Medicine Teaching Service         Attending Physician: Dr. Annia Belt, MD    First Contact: Dr. Donne Hazel Pager: [phone]  Second Contact: Dr. Hetty Ely Pager: [phone]       After Hours (After 5p/  First Contact Pager: [phone]  weekends / holidays): Second Contact Pager: [phone]   Chief Complaint: weakness  History of Present Illness:  Justin Blackburn is a 67yo male with PMH of CAD s/p CABG (1998) and stents (last in 07/2017 DES to left main and LCx, HTN, HLD, esophageal adenocarcinoma (stage T3) s/p chemo/radiation (carboplatin/paclitaxel), PAD, recent LLE DVT, atrial flutter/fib, T2DM presenting to ED after a fall.  Patient states that since his last hospitalization in June he has continued to have progressive weakness that has been significantly worse over the last 2-3 days; he ended up sliding off of his bed 2 days ago and then again last night while trying to use a urinal which prompted the family to call EMS. On EMS arrival he was found to be hypoxic to 69% and was placed on NRB with O2 sats improving to 98%.   Patient has noticed a new nonproductive cough over the last week which he attributed to post-nasal drip; on treatment with antihistamines and decongestants he felt that his cough improved. His cough is worse when lying flat and improves with sitting up. He has had ongoing LLE swelling related to a DVT diagnosed a month ago and new swelling on his RLE that has been progressive. He notes significant improvement in the LLE swelling and endorses resolution of pain and erythema which he had previously. He denies chest pain, shortness of breath, increased abdominal girth, nausea, vomiting, hematuria, hematochezia, melena, or fevers.  He completed his chemo and radiation treatment about a month ago; since  about 2 weeks ago he has had return of his appetite and resolution of odynophagia so has been eating and drinking well.  His last hospitalization in June he was admitted for a fib with RVR (treated initially with dilt drip then transitioned to coreg and amiodarone), acute LLE DVT (initially on heparin drip then transitioned to Eliquis); he had hypotension and an AKI so his ARB (candesartan), diltiazem and furosemide were discontinued at discharge with arranged close follow up with cards outpatient. At follow up appointment 12/20/17 he appeared euvolemic and lasix was not restarted.   In the ED he was unable to be weaned from NRB due to hypoxia; EKG was nonischemic, I stat troponin was negative. CXR with diffuse pulmonary infiltrates. He has mild LA to 2.31. Cmet significant for Cr 1.25 (last was 1.20), Na 131, alb 1.8, alk phos of 169. CBC significant for WBC 12.3, Hgb 10.1 (last 12.4). BNP was elevated to 781 (previous in 100s). He was given 1 dose of IV lasix '40mg'$ .  Meds:  Current Meds  Medication Sig  . acetaminophen (TYLENOL) 325 MG tablet Take 2 tablets (650 mg total) by mouth every 6 (six) hours as needed for mild pain or headache.  [name] amiodarone (PACERONE) 200 MG tablet Take 1 tablet (200 mg total) by mouth daily.  [name] apixaban (ELIQUIS) 5 MG TABS tablet Take 5 mg by mouth 2 (  two) times daily.  [name] atorvastatin (LIPITOR) 40 MG tablet Take 40 mg by mouth daily at 6 PM.   . carvedilol (COREG) 3.125 MG tablet Take 1 tablet (3.125 mg total) by mouth 2 (two) times daily with a meal.  . clopidogrel (PLAVIX) 75 MG tablet Take 1 tablet (75 mg total) by mouth daily. Take for 6 months. Plan to stop on August 27th. Check with cardiologist at that time.  . ferrous sulfate 325 (65 FE) MG tablet Take 325 mg by mouth daily.  [name] gabapentin (NEURONTIN) 300 MG capsule Take 300 mg by mouth 2 (two) times daily.  . insulin glargine (LANTUS) 100 UNIT/ML injection Inject 25 Units into the skin daily.  . pantoprazole  (PROTONIX) 40 MG tablet Take 1 tablet (40 mg total) by mouth daily.  . prochlorperazine (COMPAZINE) 10 MG tablet Take 1 tablet (10 mg total) by mouth every 6 (six) hours as needed for nausea or vomiting.  . sucralfate (CARAFATE) 1 g tablet Take 1 tablet (1 g total) by mouth 4 (four) times daily -  with meals and at bedtime. 5 min before meals for radiation induced esophagitis    Allergies: Allergies as of 01/14/2018  . (No Known Allergies)   Past Medical History:  Diagnosis Date  . A-fib ([place])    with RVR  . Acute kidney injury ([place])   . Angina   . Arthritis    "hands" (08/10/2017)  . Atrial flutter, paroxysmal ([place])    arrived with aflutter with RVR, EF down to 25-30%, s/p TEE DCCV on 01/29/2015  . CHF (congestive heart failure) ([place])   . Chronic lower back pain    "all the time from my sciatic nerve"  . Coronary artery disease   . DVT (deep venous thrombosis) ([place])   . Erectile dysfunction   . GERD (gastroesophageal reflux disease)   . Heart failure ([place])   . Hypercholesteremia   . Hypertension   . 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 Myocardial infarction ([place]) 1988; ~4403;  ~1996;~ 2000  . Obesity   . Peripheral vascular disease ([place])   . Umbilical hernia    unrepaired    Family History: Significant CAD history in several family members with onset <55yo  Social History: Endorses smoking tobacco for a couple of years as a teenager (last use 9yr ago), denies EtOH or illicit drug use  Review of Systems: A complete ROS was negative except as per HPI.   Physical Exam: Blood pressure (!) 159/91, pulse 72, temperature 99.2 F (37.3 C), temperature source Oral, resp. rate (!) 25, height 6' (1.829 m), weight 208 lb (94.3 kg), SpO2 98 %. GENERAL- alert, co-operative, appears as stated age, not in any distress. HEENT- Atraumatic, normocephalic, PERRL, EOMI, oral mucosa appears moist; +JVD CARDIAC- RRR, no murmurs, rubs or gallops. RESP- mild increased  work of breathing; able to speak in full sentences; rales throughout lung fields; no wheezing. ABDOMEN- Soft, nontender, bowel sounds present. NEURO- No obvious Cr N abnormality. EXTREMITIES- pulse 2+ radial and PT, symmetric; bil LE pitting edema 2+ R>L to upper thighs; no erythema or tenderness to calves SKIN- Warm, dry. Rug burn on R knee from recent fall. PSYCH- Normal mood and affect, appropriate thought content and speech.  EKG: personally reviewed my interpretation is atrial fibrillation; no ST segment elevations or TWI which are new  CXR: personally reviewed my interpretation is diffuse pulmonary infiltrates consistent with pulmonary edema; cardiomegaly; very rotated film  Assessment & Plan by  Problem: Principal Problem:   CHF exacerbation ([place]) Active Problems:   Chronic combined systolic and diastolic heart failure, NYHA class 2 (HCC)  Acute combined CHF exacerbation CAD s/p CABG ('98) and stenting (2x DES left main and LCx 08/2017): Patient with history of combined CHF (last echo 08/2017) with EF 40-45% with mildly reduced systolic function, mild concentric hypertrophy and hypokinesis of basal mid-inferior and inferoseptal myocardium with DES stenting in 07/2017 of left main and LCx now on plavix. During recent admission for a fib with RVR, acute LLE DVT and AKI likely in setting of poor PO intake with radiation for his esophageal adenocarcinoma, his lasix had been stopped. Since then he has had increased PO intake and likely slow accumulation of fluid as his main complaint has been general weakness for weeks. He was hypoxic at RA, with elevated BNP, hyponatremia, hypoalbuminemia, negative EKG and istat trop for ischemia and CXR and exam consistent with volume overload. Due to being relatively asymptomatic, it is less likely this was caused by an acute event such as PE (has been consistent with taking eliquis) or another ACS event.  --s/p lasix '40mg'$  in ED; will continue BID dosing for  now --strict intake/output; daily weights --Diet HH/CM/fluid restricted --trend trops to ensure no ACS underlying etiology with his significant coronary history --repeat Echo this admission --cards on board per family request; appreciate their input --continue coreg 3.'125mg'$  BID, plavix '75mg'$  daily --continue holding ARB which was stopped at last hospitalization --continue NRB (unable to wean him yet); wean to RA as tolerated to maintain O2 sat >94; BiPAP PRN though unlikely at this point he will need it --f/u AM Bmet  Acute on chronic anemia: Hgb 10.1 this admission compared to 12.4 about a month ago. He is on daily iron supplement. He denies s/x of bleeding. It may be related to his esophageal adenocarcinoma and likely has a component of dilution as well. --f/u AM CBC  H/o of LLE DVT: Per patient report and pictures family has provided, patient's LLE is less edematous and w/o erythema or tenderness any longer. It appears he has responded well to Eliquis for treatment of DVT and no s/sx of new acute VTE event on Eliquis at this time. --continue eliquis '5mg'$  BID  A flutter/fib: On EKG he appeared to be in a fib but on tele while examining patient he had RRR and appeared to be back in sinus. At last hospitalization he was started on amiodarone '400mg'$  daily and was decreased down to '200mg'$  daily about 3 weeks ago. Last TSH was 0.126 12/06/2017. --continue amiodarone '200mg'$  daily; repeat TSH in about 2 weeks --continue coreg 3.'125mg'$  daily  T2DM: Patient on Lantus 25 units qhs; his Januvia was stopped due to AKI during recent admission. SBG on admission was 174; he denies s/x of hyperglycemia. --Lantus 18 units qhs --SSI-S for now; may need scheduled mealtime coverage once he is eating better and not requiring NRB  H/o esophageal cancer: S/p radiation and chemo with carboplatin/paclitaxel. He follows with Dr. Servando Snare who is considering resection however with his recent cardiac issues and  admissions, this has been delayed and he will follow up outpt with him in about 1 month for re-evaluation of risks and consideration for resection.  IVF: none Diet: HH/CM/FR Code: FULL - discussed at admission with patient  Dispo: Admit patient to Inpatient with expected length of stay greater than 2 midnights.  Signed: Alphonzo Grieve, MD 01/14/2018, 10:08 AM  [facility] - PGY3 Pager [phone]

## 2018-01-15 ENCOUNTER — Inpatient Hospital Stay (HOSPITAL_COMMUNITY): Payer: Medicare HMO

## 2018-01-15 DIAGNOSIS — E440 Moderate protein-calorie malnutrition: Secondary | ICD-10-CM

## 2018-01-15 DIAGNOSIS — Z86718 Personal history of other venous thrombosis and embolism: Secondary | ICD-10-CM

## 2018-01-15 DIAGNOSIS — E119 Type 2 diabetes mellitus without complications: Secondary | ICD-10-CM

## 2018-01-15 DIAGNOSIS — I5021 Acute systolic (congestive) heart failure: Secondary | ICD-10-CM

## 2018-01-15 LAB — GLUCOSE, CAPILLARY
GLUCOSE-CAPILLARY: 70 mg/dL (ref 70–99)
GLUCOSE-CAPILLARY: 133 mg/dL — AB (ref 70–99)
GLUCOSE-CAPILLARY: 183 mg/dL — AB (ref 70–99)
Glucose-Capillary: 143 mg/dL — ABNORMAL HIGH (ref 70–99)

## 2018-01-15 LAB — BASIC METABOLIC PANEL
ANION GAP: 8 (ref 5–15)
BUN: 18 mg/dL (ref 8–23)
CALCIUM: 7.6 mg/dL — AB (ref 8.9–10.3)
CO2: 27 mmol/L (ref 22–32)
Chloride: 100 mmol/L (ref 98–111)
Creatinine, Ser: 1.05 mg/dL (ref 0.61–1.24)
Glucose, Bld: 91 mg/dL (ref 70–99)
Potassium: 3.2 mmol/L — ABNORMAL LOW (ref 3.5–5.1)
Sodium: 135 mmol/L (ref 135–145)

## 2018-01-15 LAB — BLOOD GAS, ARTERIAL
ACID-BASE EXCESS: 4.1 mmol/L — AB (ref 0.0–2.0)
Bicarbonate: 27.5 mmol/L (ref 20.0–28.0)
DRAWN BY: 448981
FIO2: 100
O2 SAT: 95.4 %
PCO2 ART: 37.3 mmHg (ref 32.0–48.0)
PH ART: 7.481 — AB (ref 7.350–7.450)
Patient temperature: 98.8
pO2, Arterial: 78.3 mmHg — ABNORMAL LOW (ref 83.0–108.0)

## 2018-01-15 LAB — MAGNESIUM
MAGNESIUM: 1.4 mg/dL — AB (ref 1.7–2.4)
MAGNESIUM: 1.5 mg/dL — AB (ref 1.7–2.4)

## 2018-01-15 LAB — CBC
HCT: 28.3 % — ABNORMAL LOW (ref 39.0–52.0)
HEMOGLOBIN: 9.1 g/dL — AB (ref 13.0–17.0)
MCH: 24 pg — ABNORMAL LOW (ref 26.0–34.0)
MCHC: 32.2 g/dL (ref 30.0–36.0)
MCV: 74.7 fL — ABNORMAL LOW (ref 78.0–100.0)
Platelets: 329 10*3/uL (ref 150–400)
RBC: 3.79 MIL/uL — AB (ref 4.22–5.81)
RDW: 23.8 % — ABNORMAL HIGH (ref 11.5–15.5)
WBC: 10.1 10*3/uL (ref 4.0–10.5)

## 2018-01-15 LAB — PHOSPHORUS: Phosphorus: 4 mg/dL (ref 2.5–4.6)

## 2018-01-15 LAB — POTASSIUM: POTASSIUM: 3.3 mmol/L — AB (ref 3.5–5.1)

## 2018-01-15 MED ORDER — POTASSIUM CHLORIDE CRYS ER 20 MEQ PO TBCR
40.0000 meq | EXTENDED_RELEASE_TABLET | Freq: Once | ORAL | Status: AC
  Administered 2018-01-15: 40 meq via ORAL
  Filled 2018-01-15: qty 2

## 2018-01-15 MED ORDER — BENZONATATE 100 MG PO CAPS
100.0000 mg | ORAL_CAPSULE | Freq: Once | ORAL | Status: AC
  Administered 2018-01-15: 100 mg via ORAL
  Filled 2018-01-15: qty 1

## 2018-01-15 MED ORDER — MAGNESIUM SULFATE 2 GM/50ML IV SOLN
2.0000 g | Freq: Once | INTRAVENOUS | Status: AC
  Administered 2018-01-15: 2 g via INTRAVENOUS
  Filled 2018-01-15: qty 50

## 2018-01-15 NOTE — Progress Notes (Signed)
Internal Medicine Attending  Date: 01/15/2018  Patient name: Justin Blackburn. Medical record number: [id number] Date of birth: 07-Jul-1950 Age: 67 y.o. Gender: male  I saw and evaluated the patient. I reviewed the resident's note by Dr. Hetty Ely and I agree with the resident's findings and plans as documented in her progress note.  When seen on rounds this morning Justin Blackburn was sitting comfortably in bed on a partial nonrebreather about to roll down to x-ray for a PA and lateral chest x-ray. His oxygen saturations were in the mid 90s. Review of his telemetry strips reveal he has converted to normal sinus rhythm by the time of our evaluation.  He had a net 3100 mL diuresis yesterday.  His WBC count is improving on no antibiotics.  We are continuing aggressive diuresis with intravenous Lasix 40 mg twice daily for our working diagnosis of acute on chronic combined systolic and diastolic heart failure related to increased oral intake in the setting of recently held diuretics.  I anticipate he will require several more days of this intensive intravenous diuresis.  Fortunately, his work of breathing at this point is minimal and I believe we will have time to address his presumed pulmonary edema within that timeframe.  I am surprised at how well he looks and feels clinically relative to his O2 requirements and CXR.  Thus, if we do not see some clinical improvement via the surrogate of decreased O2 requirements we will need to broaden our differential.  My suspicion is low for infection, but a pulmonary hemorrhage in the setting of recent chemoradiation and significant anticoagulation therapy must be kept in mind if his course to not follow the path expected if this is all pulmonary edema.

## 2018-01-15 NOTE — Plan of Care (Signed)
  Problem: Safety: Goal: Ability to remain free from injury will improve Outcome: Progressing   Problem: Skin Integrity: Goal: Risk for impaired skin integrity will decrease Outcome: Progressing   Problem: Education: Goal: Knowledge of General Education information will improve Description Including pain rating scale, medication(s)/side effects and non-pharmacologic comfort measures Outcome: Not Progressing   Problem: Health Behavior/Discharge Planning: Goal: Ability to manage health-related needs will improve Outcome: Not Progressing   Problem: Clinical Measurements: Goal: Ability to maintain clinical measurements within normal limits will improve Outcome: Not Progressing Goal: Diagnostic test results will improve Outcome: Not Progressing Goal: Respiratory complications will improve Outcome: Not Progressing   Problem: Activity: Goal: Risk for activity intolerance will decrease Outcome: Not Progressing   Problem: Nutrition: Goal: Adequate nutrition will be maintained Outcome: Not Progressing   Problem: Coping: Goal: Level of anxiety will decrease Outcome: Not Progressing

## 2018-01-15 NOTE — Progress Notes (Signed)
CSW acknowledges consult for advance directives. For further assistance with this matter please consult chaplain services.   At this time there are no further CSW needs. CSW will sign off. If new need arises please recosnult.    Virgie Dad Crista Nuon, MSW, [facility] Clinical Social Worker [phone]

## 2018-01-15 NOTE — Evaluation (Signed)
Physical Therapy Evaluation Patient Details Name: Justin Blackburn. MRN: [id number] DOB: 1950/11/15 Today's Date: 01/15/2018   History of Present Illness  Pt is a 67 y/o male with PMH of CAD s/p CABG (1998) and stents (last in 07/2017 DES to left main and LCx, HTN, HLD, esophageal adenocarcinoma (stage T3) s/p chemo/radiation (carboplatin/paclitaxel), PAD, recent LLE DVT, atrial flutter/fib, T2DM presenting to ED after a fall. Pt admitted with CHF exacerbation.    Clinical Impression  Pt presented supine in bed with HOB elevated, awake and willing to participate in therapy session. Prior to admission, pt reported that he was independent with all functional mobility and ADLs. Pt lives with his spouse who was present throughout evaluation. Pt currently requires min A for bed mobility and min-mod A for transfers with RW. Pt very limited secondary to fatigue and poor pulmonary endurance. Pt on 12L of O2 via HFNC throughout with SPO2 decreasing to as low as 77% with activity. Pt's RN in room at end of session and aware. Pt would continue to benefit from skilled physical therapy services at this time while admitted and after d/c to address the below listed limitations in order to improve overall safety and independence with functional mobility.     Follow Up Recommendations SNF    Equipment Recommendations  None recommended by PT    Recommendations for Other Services       Precautions / Restrictions Precautions Precautions: Fall Restrictions Weight Bearing Restrictions: No      Mobility  Bed Mobility Overal bed mobility: Needs Assistance Bed Mobility: Supine to Sit     Supine to sit: Min assist     General bed mobility comments: increased time and effort, min A for trunk elevation, use of bed pads to position hips at EOB  Transfers Overall transfer level: Needs assistance Equipment used: Rolling walker (2 wheeled) Transfers: Sit to/from [facility] Sit to Stand: Mod  assist Stand pivot transfers: Min assist       General transfer comment: increased time and effort, bed in elevated position, mod A to power into standing from EOB and min A with pivot to BSC towards pt's L side  Ambulation/Gait             General Gait Details: pt desating to mid 70's on HFNC 12L with just bed mobility and a transfer to [facility]; defer ambulation at this time  Stairs            Wheelchair Mobility    Modified Rankin (Stroke Patients Only)       Balance Overall balance assessment: Needs assistance Sitting-balance support: Feet supported Sitting balance-Leahy Scale: Fair     Standing balance support: Bilateral upper extremity supported Standing balance-Leahy Scale: Poor                               Pertinent Vitals/Pain Pain Assessment: No/denies pain    Home Living Family/patient expects to be discharged to:: Private residence Living Arrangements: Spouse/significant other Available Help at Discharge: Family;Available 24 hours/day Type of Home: House Home Access: Stairs to enter Entrance Stairs-Rails: Right;Left;Can reach both Entrance Stairs-Number of Steps: 2 Home Layout: One level Home Equipment: Cane - single point Additional Comments: information gathered from chart review of pt's previous evaluation during a recent admission. During this evaluation, pt and pt's spouse too distracted and hyperfocused on pt's current health status to answer home environment questions    Prior Function Level  of Independence: Independent               Hand Dominance        Extremity/Trunk Assessment   Upper Extremity Assessment Upper Extremity Assessment: Generalized weakness    Lower Extremity Assessment Lower Extremity Assessment: Generalized weakness       Communication   Communication: No difficulties  Cognition Arousal/Alertness: Awake/alert Behavior During Therapy: WFL for tasks assessed/performed Overall Cognitive  Status: Within Functional Limits for tasks assessed                                        General Comments      Exercises     Assessment/Plan    PT Assessment Patient needs continued PT services  PT Problem List Decreased activity tolerance;Decreased balance;Decreased mobility;Decreased coordination;Decreased knowledge of use of DME;Decreased safety awareness;Decreased knowledge of precautions;Cardiopulmonary status limiting activity       PT Treatment Interventions DME instruction;Gait training;Stair training;Functional mobility training;Therapeutic activities;Therapeutic exercise;Balance training;Neuromuscular re-education;Patient/family education    PT Goals (Current goals can be found in the Care Plan section)  Acute Rehab PT Goals Patient Stated Goal: have a bowel movement PT Goal Formulation: With patient/family Time For Goal Achievement: 01/29/18 Potential to Achieve Goals: Fair    Frequency Min 2X/week   Barriers to discharge        Co-evaluation               AM-PAC PT "6 Clicks" Daily Activity  Outcome Measure Difficulty turning over in bed (including adjusting bedclothes, sheets and blankets)?: Unable Difficulty moving from lying on back to sitting on the side of the bed? : Unable Difficulty sitting down on and standing up from a chair with arms (e.g., wheelchair, bedside commode, etc,.)?: Unable Help needed moving to and from a bed to chair (including a wheelchair)?: A Lot Help needed walking in hospital room?: A Lot Help needed climbing 3-5 steps with a railing? : Total 6 Click Score: 8    End of Session Equipment Utilized During Treatment: Gait belt;Oxygen(HFNC 12L) Activity Tolerance: Patient limited by fatigue Patient left: with call bell/phone within reach;with family/visitor present;Other (comment)(RN and NT present with pt on [facility]) Nurse Communication: Mobility status PT Visit Diagnosis: Other abnormalities of gait and mobility  (R26.89);Muscle weakness (generalized) (M62.81)    Time: 1520-1540 PT Time Calculation (min) (ACUTE ONLY): 20 min   Charges:   PT Evaluation $PT Eval Moderate Complexity: [place], [place], [place] 01/15/2018, 5:06 PM

## 2018-01-15 NOTE — Plan of Care (Signed)
Patient's wife came to nurse's station and asked if his meals were being held. RN attempted to explain that patient was unable to tolerate being off oxygen, and had removed his O2 NRB mask at breakfast resulting in a significant deterioration in patient's status (SaO2 dropped to 52% on room air.) Patient's wife became verbally aggressive, insisting that patient needed to eat to keep his strength and get well. RN attempted education with wife regarding priority of care-unsuccessful-wife became more verbally aggressive. Charge nurse notified and engaged wife in further attempts to educate her regarding priority of care-unsuccessful. Patient is being monitored by Rapid Response Team as of about 0900 this morning.

## 2018-01-15 NOTE — Discharge Instructions (Signed)

## 2018-01-15 NOTE — Progress Notes (Signed)
Subjective:  Less dyspnea no chest pain  Objective:  Vitals:   01/15/18 0000 01/15/18 0322 01/15/18 0400 01/15/18 0759  BP:  120/74  128/73  Pulse: 70 70 97 85  Resp: (!) 24 (!) 23 (!) 35 (!) 26  Temp:  98.5 F (36.9 C)  98.8 F (37.1 C)  TempSrc:  Oral  Oral  SpO2: 96% 95% (!) 80% 96%  Weight:  201 lb 11.5 oz (91.5 kg)    Height:        Intake/Output from previous day:  Intake/Output Summary (Last 24 hours) at 01/15/2018 1014 Last data filed at 01/15/2018 0629 Gross per 24 hour  Intake 370 ml  Output 3500 ml  Net -3130 ml    Physical Exam: .Affect appropriate Healthy:  appears stated age 67: normal Neck supple with no adenopathy JVP normal no bruits no thyromegaly Lungs clear with no wheezing and good diaphragmatic motion Heart:  S1/S2 no murmur, no rub, gallop or click PMI normal Abdomen: benighn, BS positve, no tenderness, no AAA no bruit.  No HSM or HJR Distal pulses intact with no bruits No edema Neuro non-focal Skin warm and dry No muscular weakness   Lab Results: Basic Metabolic Panel: Recent Labs    01/14/18 0707 01/14/18 0718 01/15/18 0326  NA 131* 129* 135  K 4.4 5.6* 3.2*  CL 98 97* 100  CO2 22  --  27  GLUCOSE 174* 167* 91  BUN 21 33* 18  CREATININE 1.25* 1.10 1.05  CALCIUM 8.2*  --  7.6*  MG  --   --  1.5*   Liver Function Tests: Recent Labs    01/14/18 0707  AST 43*  ALT 25  ALKPHOS 169*  BILITOT 0.9  PROT 5.9*  ALBUMIN 1.8*   No results for input(s): LIPASE, AMYLASE in the last 72 hours. CBC: Recent Labs    01/14/18 0707 01/14/18 0718 01/15/18 0326  WBC 12.3*  --  10.1  NEUTROABS 10.2*  --   --   HGB 10.1* 11.6* 9.1*  HCT 32.3* 34.0* 28.3*  MCV 75.6*  --  74.7*  PLT 398  --  329   Cardiac Enzymes: Recent Labs    01/14/18 0932 01/14/18 1227 01/14/18 2058  TROPONINI 0.07* 0.10* 0.09*    Imaging: Dg Chest Port 1 View  Result Date: 01/14/2018 CLINICAL DATA:  Patient fell from bed. Low O2 sats.  Occasional cough. EXAM: PORTABLE CHEST 1 VIEW COMPARISON:  CT 01/06/2018. FINDINGS: Prior CABG. Cardiomegaly. Diffuse bilateral pulmonary infiltrates most consistent pulmonary edema. Bilateral pneumonia cannot be excluded. Small right pleural effusion. No pneumothorax. No acute bony abnormality identified. IMPRESSION: Prior CABG. Cardiomegaly with diffuse bilateral pulmonary infiltrates most consistent with pulmonary edema. Small right pleural effusion. Electronically Signed   By: Marcello Moores  Register   On: 01/14/2018 07:45    Cardiac Studies:\  ECG:  afib RBBB old IMI   Telemetry:  NSR rates 70-80 bpm  Echo: EF 45-50% mild LAE    Medications:   . amiodarone  200 mg Oral Daily  . apixaban  5 mg Oral BID  . atorvastatin  40 mg Oral q1800  . carvedilol  3.125 mg Oral BID WC  . clopidogrel  75 mg Oral Daily  . furosemide  40 mg Intravenous BID  . gabapentin  300 mg Oral BID  . insulin aspart  0-9 Units Subcutaneous TID WC  . pantoprazole  40 mg Oral Daily  . sucralfate  1 g Oral TID WC & HS  Assessment/Plan:   PAF:  Converted to NSR cotinue eliquis and coreg CHF:  Improved good diuresis continue lasxi CAD/CABG 1998 and redo in 2007 LM stent 08/2017 no chest pain troponin .10 no plans for further evaluation Esophageal Cancer:  Rx per oncology cannot have TEE in future on pantoprazole and sucralfate  HLD:  Continue statin   Jenkins Rouge 01/15/2018, 10:14 AM

## 2018-01-15 NOTE — Progress Notes (Addendum)
Subjective: Mr. Prochnow says he is feeling the same as he did yesterday. He denies any new concerns or symptoms. He denies lightheadedness or chest pain, shortness of breath. He did not particularly notice any change in urinary output overnight.   Objective:  Vital signs in last 24 hours: Vitals:   01/15/18 0000 01/15/18 0322 01/15/18 0400 01/15/18 0759  BP:  120/74  128/73  Pulse: 70 70 97 85  Resp: (!) 24 (!) 23 (!) 35 (!) 26  Temp:  98.5 F (36.9 C)  98.8 F (37.1 C)  TempSrc:  Oral  Oral  SpO2: 96% 95% (!) 80% 96%  Weight:  201 lb 11.5 oz (91.5 kg)    Height:       General: no acute distress, sitting up in bed, breakfast tray at bedside half eaten, speaking on the phone Cardiac: irregularly irregular rhythm, no murmurs, normal S1 and S2, JVD about 5 cm above the clavicle, 2+ left lower extremity edema to the hip, 1+ right lower extremity edema to the knee  Pulm: on non rebreather mask, normal work of breathing, speaking in full sentences, no accessory muscle use, no tachypnea, begins coughing when sat forward for lung exam, fine rhonchi throughout left lung fields and up to just below the scapula on the right  GI: umbilical hernia, BS hyperactive, soft, nontender, non distended   Assessment/Plan:  Patient has a history of chronic combined CHF secondary to ischemic cardiomyopathy with DES to the LAD and LCx, esophogeal adenocarcinoma s/p chemoradiation, afib/flutter  He was admit in june a fib with RVR, acute LLE DVT and AKI he was discharged on amio with loading dose and eliquis and his lasix was stopped on discharge due to poor po intake resulting in orthostatic hypotension. He has reported an increase in PO intake since then. On admission he was hypoxic on RA, had a BNP of 781, hyponatremia, hypoalbuminemia. He was admit for acute hypoxic respiratory failure secondary to acute on chronic heart failure exacerbation.   Principal Problem:   Acute congestive heart failure  (HCC) Active Problems:   Chronic combined systolic and diastolic heart failure, NYHA class 2 (HCC)   Coronary artery disease involving coronary bypass graft of native heart without angina pectoris   Atrial fibrillation, persistent (HCC)   Pressure injury of skin   Acute pulmonary edema (HCC)   Diabetes mellitus type 2, insulin dependent (HCC)  Acute on chronic combined systolic and diastolic congestive heart failure secondary to ischemic cardiomyopathy  Acute hypoxic respiratory failure secondary to volume overload  Generalized edema is mildly improved from yesterday, work of breathing and respiratory rate are significantly improved but he is still requiring a high level of supplemental oxygen today. He has been on non rebreather throughout the night at 11 liters, this morning when seen during rounds SpO2 fluctuated between 88 and 96% on this. We will keep a BiPAP available at bedside and place this if his oxygen saturation is sustained less than 88%. I am not confident that the BIPAP will completely help to treat his hypoxemia but feel that it would be worth a try in an attempt to try to avoid intubation. I did discuss with him my concern for further worsening of this hypoxemia and we confirmed that he is full code and would want aggressive measures such as intubation if needed. The cause of hypoxemia is most likely pulmonary edema as evident on exam. We will keep in mind that he also has a known lower extremity DVT and  describes being on a reliable course of eliquis for treatment of this. We will obtain an ABG, 2 view chest xray, and repeat EKG to evaluate further. - compensated, perfusing well and he is not hypotensive  - echo yesterday with EF 45-50%, there was inferolateral and inferior akynesia and inferoseptal hypokinesis evaluation. Diastolic function could not be evaluated because he was in atrial fibrillation at the time. There was mild to moderate right ventricular systolic function.  Increased intravascular volume was evident by pulmonary hypertension ( 44 mm Hg) and a dilated and non compressible IVC. When compared to prior echocardiogram in March, the wall motion abnormalities have advanced and the volume overload is new.  - continue home coreg, plavix, and statin   - not on ACE/ ARB due to orthostatic hypotension in the past, we should resume this at a very low dose when he is stable  - oxygen requirement stable at 12 liters high flow nasal canula  - 4 liters of urine output yesterday after one dose of IV lasix 40 mg, weight is down 7 pounds  - creatinine stable  - potassium and magnesium low this morning, replacing and monitoring  - continue IV lasix 40 mg BID  - continue strict intake and output, daily weights  - will need an upright 2 view chest xray when he is able  - cardiology is on board, we appreciate their assistance in the care of this patient   Type 2 NSTEMI - troponin peaked at 0.10  - likely type 2 and secondary to demand ischemia from volume overload   Persistent atrial fibrillation  - remains in rate controlled atrial fibrillation with frequent PVCs on tele overnight  - repeat EKG today  - continue amiodarone 200 mg daily for rate control  - continue eliquis for stroke ppx  - continue electrolyte repletion as mentioned above  - continue telemetry monitoring   Acute on chronic anemia  Hgb 10.1 on admission trended down to 9 this morning, last CBC was one month ago with hemoglobin 12.4.  - continue iron supplementation  - continue to monitor CBC   History of DVT  - continue eliquis 5 mg BID   Type 2 diabetes  - blood glucose trended 70-140 over the past 24 hours  - continue CBG monitoring and SSI   History of esophogeal cancer   Dispo: Anticipated discharge in approximately 4-5 day(s).   Ledell Noss, MD 01/15/2018, 9:06 AM Pager: [phone]

## 2018-01-15 NOTE — Progress Notes (Addendum)
Initial Nutrition Assessment  DOCUMENTATION CODES:   Non-severe (moderate) malnutrition in context of chronic illness  INTERVENTION:  - Will order Magic Cup BID with meals, each supplement provides 290 kcal and 9 grams of protein. - Continue to encourage PO intakes.    NUTRITION DIAGNOSIS:   Moderate Malnutrition related to chronic illness, catabolic illness, cancer and cancer related treatments as evidenced by mild fat depletion, mild muscle depletion.  GOAL:   Patient will meet greater than or equal to 90% of their needs  MONITOR:   PO intake, Supplement acceptance, Weight trends, Labs  REASON FOR ASSESSMENT:   Malnutrition Screening Tool  ASSESSMENT:   67 year old man with advanced CAD s/p MI, s/p bypass surgery x2, s/p recent angioplasty and drug eluting stent placement in February 2019.  Severe multivessel disease with total occlusion of the RCA and LAD, severe stenosis. 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  Recent completed a course of chemotherapy and radiation therapy in June.  History of atrial fibrillation/flutter diagnosed in March 2019. Patient reports 40 lb weight loss since cancer diagnosis. Patient presented to the ED after he slid off of his couch while trying to use a urinal and could not get up off the floor. Ambulance was called and the patient was transported to the hospital. He was found to be severely hypoxic in the ED.  BMI indicates overweight status. Per chart review, patient consumed 10% of dinner last night (58 kcal, 2.5 grams of protein). Breakfast tray on bedside table and patient had consumed all of pancakes, eggs untouched. Patient reported they got cold. Spoke with RN prior to entering patient's room and he had reported that patient was on NRB and that patient removed mask to eat and sats dropped to ~50%. He was subsequently placed on HFNC with sats still in the 80s. During  RD visit sats were 73-91%. RR RN arrived to room during RD visit and was going to talk with MD.   Patient reports that appetite has been improving the past ~2 months. He eats 3-4 meals/day and states he understands the importance of eating well. He states some intermittent scratchiness in throat/esophagus. He was previously having pain with difficulty eating and drinking prior to and during the first half of treatment. He states that for ~2 months around the time of diagnosis he had very poor intakes and that he was mainly consuming milk and watermelon. He had tried oral nutrition supplements during that time and did not like them, is not interested in receiving them here. He states that at home he cooks meals, has cooked for years, and can make whatever he would like to eat. He denies any chewing issues.  NFPE outlined below. Per chart review, patient has lost 5 lbs (2.4% body weight) in the past 1 month; this is not significant for time frame. He has lost 19 lbs (8% body weight) in the past 3 months; this is significant for time frame.   Medications reviewed; 40 mg IV Lasix BID, sliding scale Novolog, 2 g IV Mg sulfate x1 run today, 40 mg oral Protonix/day, 40 mEq oral KCl x1 dose today, 1 g Carafate TID.  Labs reviewed; CBG: 70 mg/dL, K: 3.2 mmol/L, Ca: 7.6 mg/dL, Mg: 1.5 mg/dL.     NUTRITION - FOCUSED PHYSICAL EXAM:    Most Recent Value  Orbital Region  No depletion  Upper Arm Region  Mild depletion  Thoracic and Lumbar Region  No  depletion  Buccal Region  No depletion  Temple Region  No depletion  Clavicle Bone Region  Moderate depletion  Clavicle and Acromion Bone Region  Mild depletion  Scapular Bone Region  Unable to assess  Dorsal Hand  Mild depletion  Patellar Region  No depletion  Anterior Thigh Region  Unable to assess  Posterior Calf Region  No depletion  Edema (RD Assessment)  Severe [BLE]  Hair  Reviewed  Eyes  Reviewed  Mouth  Reviewed  Skin  Reviewed  Nails  Reviewed        Diet Order:   Diet Order           Diet heart healthy/carb modified Room service appropriate? Yes; Fluid consistency: Thin; Fluid restriction: 1800 mL Fluid  Diet effective now          EDUCATION NEEDS:   No education needs have been identified at this time  Skin:  Skin Assessment: Skin Integrity Issues: Skin Integrity Issues:: Stage I Stage I: sacrum  Last BM:  8/2  Height:   Ht Readings from Last 1 Encounters:  01/14/18 6' (1.829 m)    Weight:   Wt Readings from Last 1 Encounters:  01/15/18 201 lb 11.5 oz (91.5 kg)    Ideal Body Weight:  80.91 kg  BMI:  Body mass index is 27.36 kg/m.  Estimated Nutritional Needs:   Kcal:  4166-0630 (26-28 kcal/kg)  Protein:  120-130 grams  Fluid:  >/= 2 L/day     Jarome Matin, MS, RD, LDN, [facility] Inpatient Clinical Dietitian Pager # [phone] After hours/weekend pager # [phone]

## 2018-01-16 ENCOUNTER — Inpatient Hospital Stay (HOSPITAL_COMMUNITY): Payer: Medicare HMO

## 2018-01-16 DIAGNOSIS — D509 Iron deficiency anemia, unspecified: Secondary | ICD-10-CM

## 2018-01-16 DIAGNOSIS — I5031 Acute diastolic (congestive) heart failure: Secondary | ICD-10-CM

## 2018-01-16 DIAGNOSIS — J9601 Acute respiratory failure with hypoxia: Secondary | ICD-10-CM

## 2018-01-16 DIAGNOSIS — J810 Acute pulmonary edema: Secondary | ICD-10-CM

## 2018-01-16 DIAGNOSIS — I5043 Acute on chronic combined systolic (congestive) and diastolic (congestive) heart failure: Secondary | ICD-10-CM

## 2018-01-16 DIAGNOSIS — I481 Persistent atrial fibrillation: Secondary | ICD-10-CM

## 2018-01-16 LAB — GLUCOSE, CAPILLARY
GLUCOSE-CAPILLARY: 99 mg/dL (ref 70–99)
Glucose-Capillary: 100 mg/dL — ABNORMAL HIGH (ref 70–99)
Glucose-Capillary: 211 mg/dL — ABNORMAL HIGH (ref 70–99)
Glucose-Capillary: 155 mg/dL — ABNORMAL HIGH (ref 70–99)
Glucose-Capillary: 67 mg/dL — ABNORMAL LOW (ref 70–99)

## 2018-01-16 LAB — BASIC METABOLIC PANEL
ANION GAP: 9 (ref 5–15)
BUN: 17 mg/dL (ref 8–23)
CALCIUM: 7.7 mg/dL — AB (ref 8.9–10.3)
CO2: 28 mmol/L (ref 22–32)
CREATININE: 0.93 mg/dL (ref 0.61–1.24)
Chloride: 98 mmol/L (ref 98–111)
GLUCOSE: 126 mg/dL — AB (ref 70–99)
Potassium: 3.8 mmol/L (ref 3.5–5.1)
Sodium: 135 mmol/L (ref 135–145)

## 2018-01-16 LAB — CBC
HCT: 30.3 % — ABNORMAL LOW (ref 39.0–52.0)
HEMOGLOBIN: 9.5 g/dL — AB (ref 13.0–17.0)
MCH: 23.6 pg — AB (ref 26.0–34.0)
MCHC: 31.4 g/dL (ref 30.0–36.0)
MCV: 75.2 fL — ABNORMAL LOW (ref 78.0–100.0)
PLATELETS: 321 10*3/uL (ref 150–400)
RBC: 4.03 MIL/uL — AB (ref 4.22–5.81)
RDW: 23.9 % — ABNORMAL HIGH (ref 11.5–15.5)
WBC: 10.6 10*3/uL — ABNORMAL HIGH (ref 4.0–10.5)

## 2018-01-16 LAB — FERRITIN: Ferritin: 454 ng/mL — ABNORMAL HIGH (ref 24–336)

## 2018-01-16 LAB — MAGNESIUM: MAGNESIUM: 1.9 mg/dL (ref 1.7–2.4)

## 2018-01-16 LAB — PROCALCITONIN: PROCALCITONIN: 0.21 ng/mL

## 2018-01-16 LAB — LACTIC ACID, PLASMA: Lactic Acid, Venous: 2 mmol/L (ref 0.5–1.9)

## 2018-01-16 MED ORDER — MAGNESIUM SULFATE 2 GM/50ML IV SOLN
2.0000 g | Freq: Once | INTRAVENOUS | Status: AC
  Administered 2018-01-16: 2 g via INTRAVENOUS
  Filled 2018-01-16: qty 50

## 2018-01-16 MED ORDER — CEFEPIME HCL 1 G IJ SOLR
1.0000 g | Freq: Three times a day (TID) | INTRAMUSCULAR | Status: DC
  Administered 2018-01-16 – 2018-01-22 (×18): 1 g via INTRAVENOUS
  Filled 2018-01-16 (×23): qty 1

## 2018-01-16 MED ORDER — VANCOMYCIN HCL IN DEXTROSE 1-5 GM/200ML-% IV SOLN
1000.0000 mg | Freq: Two times a day (BID) | INTRAVENOUS | Status: DC
  Administered 2018-01-17: 1000 mg via INTRAVENOUS
  Filled 2018-01-16: qty 200

## 2018-01-16 MED ORDER — FUROSEMIDE 10 MG/ML IJ SOLN
80.0000 mg | Freq: Two times a day (BID) | INTRAMUSCULAR | Status: DC
  Administered 2018-01-16 – 2018-01-19 (×6): 80 mg via INTRAVENOUS
  Filled 2018-01-16 (×6): qty 8

## 2018-01-16 MED ORDER — VANCOMYCIN HCL 10 G IV SOLR
2000.0000 mg | Freq: Once | INTRAVENOUS | Status: AC
  Administered 2018-01-16: 2000 mg via INTRAVENOUS
  Filled 2018-01-16: qty 2000

## 2018-01-16 NOTE — Progress Notes (Signed)
Pt transported to CT on Bipap with no complications noted. Pt remains on Bipap at this time.

## 2018-01-16 NOTE — Progress Notes (Signed)
Internal Medicine Attending  Date: 01/16/2018  Patient name: Justin Blackburn. Medical record number: [id number] Date of birth: Dec 24, 1950 Age: 67 y.o. Gender: male  I saw and evaluated the patient. I reviewed the resident's note by Dr. Hetty Ely and I agree with the resident's findings and plans as documented in her progress note.  When seen on rounds this morning Mr. Hinzman states his breathing is comfortable despite being on a partial nonrebreather with an O2 sat of approximately 90% and tachypnea to the low 20s. He has diuresed another 1 L over the last 24 hours and 5 L for admission. Despite this diuresis, his oxygen requirements are still considerable and unimproved since admission. He had a low-grade fever of 100.6 within the last 24 hours but unfortunately was not cultured at that time. Physical exam is notable for bronchial breath sounds throughout as well as basilar crackles. I believe there is some volume that is responsible for his signs and symptoms, but I feel there is another process in play given the radiography and profound hypoxemia despite 5 L of diuresis. We will obtain a CT scan of the chest today to see if there is anything hiding from the chest x-ray. Pulmonary was consulted to assess candidacy for bronchoscopy with bronchoalveolar lavage for assessment of pulmonary hemorrhage and to obtain cultures. If he were to spike a fever again our plan was to obtain blood cultures and start antibiotics for a pneumonia. Pulmonary assessed the patient and feels the antibiotics should be started empirically. As expected they did not feel he was a candidate for BAL given the current degree of hypoxemia. They agree with the CT scan and continued aggressive diuresis.  We will increase the intravenous Lasix to 80 mg twice daily. We will follow pulmonary's recommendation for empiric therapy for a pneumonia at this point. We are hopeful that with continued diuresis his oxygenation may improve enough that he may  be a candidate to assess for pulmonary hemorrhage via bronchial lavage at sometime in the future if he were to not respond to the diuretics and antibiotics as expected.

## 2018-01-16 NOTE — Consult Note (Signed)
Name: Benecio Kluger. MRN: [id number] DOB: [birth year]    ADMISSION DATE:  01/14/2018 CONSULTATION DATE:  8/4  REFERRING MD :  [facility] /Dr. Virgina Jock   CHIEF COMPLAINT:  Hypoxia   BRIEF PATIENT DESCRIPTION: 67 year old male with minimal smoking history admitted August 2 with decompensated congestive heart failure.  Patient has esophageal cancer status post recent chemo and XRT.  Patient with progressive hypoxemia despite diuresis.  Pulmonary consulted August 4  SIGNIFICANT EVENTS    STUDIES:  2D echo 8/2> EF 45 to 50%, moderate dilation of right ventricle, PAP 44  Prior to admit--- Venous Doppler December 07, 2017 acute DVT left leg common femoral vein, femoral, popliteal, posterior tibial, peroneal, left external iliac.  PET scan 4/4> mild to moderate hyper metabolic and distal esophagus.  No findings for pulmonary or hepatic metastatic disease.  No adenopathy.  VQ scan 6/25 normal, no evidence of PE  CT chest with contrast 7/25 no evidence for metastatic adenopathy.  Small bilateral pleural effusions with interstitial and pulmonary edema.  Radiation effects of the distal esophageal   HISTORY OF PRESENT ILLNESS:   67 year old male with minimal smoking history with recent diagnosis (09/2017)  of esophageal adenocarcinoma status post chemo and radiation.  Patient has a history of coronary artery disease status post CABG in 1998 and subsequent redo in 2007.  Stents placed in 2013.  And March 2019.  Plavix started in March 2019.  Since radiation patient has significant difficulty eating with weight loss.  As of recent says that that has improved some.  He has no nausea or vomiting. Was diagnosed with a extensive DVT on the left leg December 07, 2017 started on Eliquis.  During this timeframe he started having increased shortness of breath.  A VQ scan was done that was normal with no evidence of PE.  Patient had a follow-up CT chest January 06, 2018 that showed no evidence of metastatic adenopathy.  There was  small bilateral pleural effusions with interstitial and pulmonary edema that appeared to be somewhat new.  Along with radiation effects of the along the distal esophagus.  Patient says the night before current admission that he slipped off the bed with no apparent perceived injury.  However the next morning patient became more more short of breath.  He was transferred via EMS from home to the emergency room.  Felt to have decompensated congestive heart failure.  Previous echo showed EF 45 to 50%.  Echo was repeated with no significant change this admission patient has been diuresed since admission with a -5 L removed. White count is slightly elevated.  And he has developed some low-grade temps.  Lactic acid 1.7 on admission.  Chest x-ray yesterday showed bilateral opacities concerning for edema.  Oxygen demands have increased progressively and now patient is requiring a nonrebreather to keep O2 saturations at 90%.  On arrival patient is in the bed alert and oriented follows commands.  And answers questions appropriately.  O2 saturations are 89 to 90% on 100% nonrebreather mask.  Wife is at bedside been updated  PAST MEDICAL HISTORY :   has a past medical history of A-fib ([place]), Acute kidney injury ([place]), Anemia, Angina, Arthritis, Atrial flutter, paroxysmal ([place]), Cancer ([place]), CHF (congestive heart failure) ([place]), Chronic lower back pain, Coronary artery disease, DVT (deep venous thrombosis) ([place]), Dyspnea, Erectile dysfunction, Heart failure ([place]), Hypercholesteremia, Hypertension, 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, Myocardial infarction ([place]) (1988; ~1992;  ~1996;~ 2000), Obesity, Peripheral vascular disease ([place]), and Umbilical  hernia.  has a past surgical history that includes left heart catheterization with coronary/graft angiogram (N/A, 08/31/2011); TEE without cardioversion (N/A, 01/29/2015); Cardioversion (N/A, 01/29/2015); Cardiac catheterization (N/A, 04/27/2016); Coronary  angioplasty with stent (08/31/11); Coronary angioplasty with stent (08/10/2017); Coronary artery bypass graft (1998; 2007); Esophagogastroduodenoscopy (egd) with propofol (N/A, 09/01/2017); LEFT HEART CATH AND CORS/GRAFTS ANGIOGRAPHY (N/A, 08/10/2017); CORONARY STENT INTERVENTION (N/A, 08/10/2017); and EUS (N/A, 09/29/2017). Prior to Admission medications   Medication Sig Start Date End Date Taking? Authorizing Provider  acetaminophen (TYLENOL) 325 MG tablet Take 2 tablets (650 mg total) by mouth every 6 (six) hours as needed for mild pain or headache. 09/04/17  Yes Kilroy, Doreene Burke, PA-C  amiodarone (PACERONE) 200 MG tablet Take 1 tablet (200 mg total) by mouth daily. Patient taking differently: Take 200 mg by mouth 2 (two) times daily.  12/20/17  Yes Belva Crome, MD  apixaban (ELIQUIS) 5 MG TABS tablet Take 5 mg by mouth 2 (two) times daily.   Yes [provider]  atorvastatin (LIPITOR) 40 MG tablet Take 40 mg by mouth daily at 6 PM.  07/14/17  Yes [provider]  candesartan (ATACAND) 16 MG tablet Take 16 mg by mouth daily.   Yes [provider]  carvedilol (COREG) 3.125 MG tablet Take 1 tablet (3.125 mg total) by mouth 2 (two) times daily with a meal. 12/10/17  Yes Hongalgi, Lenis Dickinson, MD  clopidogrel (PLAVIX) 75 MG tablet Take 1 tablet (75 mg total) by mouth daily. Take for 6 months. Plan to stop on August 27th. Check with cardiologist at that time. 08/11/17  Yes Daune Perch, NP  diltiazem (DILACOR XR) 120 MG 24 hr capsule Take 120 mg by mouth daily.   Yes [provider]  ferrous sulfate 325 (65 FE) MG tablet Take 325 mg by mouth daily. 07/20/17  Yes [provider]  gabapentin (NEURONTIN) 300 MG capsule Take 300 mg by mouth 2 (two) times daily. 07/09/17  Yes [provider]  insulin degludec (TRESIBA FLEXTOUCH) 100 UNIT/ML SOPN FlexTouch Pen Inject 85 Units into the skin daily.   Yes [provider]  nitroGLYCERIN (NITROSTAT) 0.4 MG SL tablet  Place 1 tablet (0.4 mg total) under the tongue every 5 (five) minutes as needed for chest pain (Angina). 06/18/14 08/09/18 Yes Belva Crome, MD  pantoprazole (PROTONIX) 40 MG tablet Take 1 tablet (40 mg total) by mouth daily. 09/05/17  Yes Kilroy, Luke K, PA-C   No Known Allergies  FAMILY HISTORY:  family history includes Diabetes in his father and mother; Heart attack in his brother, father, and mother; Heart disease in his father and mother. SOCIAL HISTORY:  reports that he quit smoking about 46 years ago. His smoking use included cigarettes. He has a 12.00 pack-year smoking history. He has never used smokeless tobacco. He reports that he does not drink alcohol or use drugs.  REVIEW OF SYSTEMS:   Constitutional: Negative for fever, chills, weight loss,  +malaise/fatigue and diaphoresis.  HENT: Negative for hearing loss, ear pain, nosebleeds, congestion, sore throat, neck pain, tinnitus and ear discharge.   Eyes: Negative for blurred vision, double vision, photophobia, pain, discharge and redness.  Respiratory:++dry cough , sob  Cardiovascular: Negative for chest pain, palpitations, orthopnea, claudication,++ leg swelling /.orthopnea  Gastrointestinal: Negative for heartburn, nausea, vomiting, abdominal pain, diarrhea, constipation, blood in stool and melena. +decreased appetite  Genitourinary: Negative for dysuria, urgency, frequency, hematuria and flank pain.  Musculoskeletal: Negative for myalgias, back pain, joint pain and +falls.  Skin: Negative for itching and rash.  Neurological:  Negative for dizziness, tingling, tremors, sensory change, speech change, focal weakness, seizures, loss of consciousness, weakness and headaches.  Endo/Heme/Allergies: Negative for environmental allergies and polydipsia. Does not bruise/bleed easily.  SUBJECTIVE: sob , increased oxygen needs   VITAL SIGNS: Temp:  [98.8 F (37.1 C)-100.6 F (38.1 C)] 99 F (37.2 C) (08/04 1119) Pulse Rate:  [49-96] 73  (08/04 1119) Resp:  [20-30] 28 (08/04 1119) BP: (115-127)/(60-71) 115/68 (08/04 1119) SpO2:  [87 %-95 %] 92 % (08/04 1119) Weight:  [191 lb 12.8 oz (87 kg)] 191 lb 12.8 oz (87 kg) (08/04 0600)  PHYSICAL EXAMINATION: General: Chronically ill-appearing male on nonrebreather Neuro: Intact, alert and oriented, appears appropriate HEENT: Atraumatic, normocephalic, dry oral mucosa Cardiovascular: RRR, positive SM,1- 2+ edema Lungs: Bibasilar crackles Abdomen: Obese soft nontender positive bowel sounds Musculoskeletal: Intact Skin: Intact no rash  Recent Labs  Lab 01/14/18 0707 01/14/18 0718 01/15/18 0326 01/16/18 0210  NA 131* 129* 135 135  K 4.4 5.6* 3.3*  3.2* 3.8  CL 98 97* 100 98  CO2 22  --  27 28  BUN 21 33* 18 17  CREATININE 1.25* 1.10 1.05 0.93  GLUCOSE 174* 167* 91 126*   Recent Labs  Lab 01/14/18 0707 01/14/18 0718 01/15/18 0326 01/16/18 0210  HGB 10.1* 11.6* 9.1* 9.5*  HCT 32.3* 34.0* 28.3* 30.3*  WBC 12.3*  --  10.1 10.6*  PLT 398  --  329 321   Dg Chest 2 View  Result Date: 01/15/2018 CLINICAL DATA:  Cough and hypoxia. History of atrial fibrillation, CHF, hypertension and diabetes. EXAM: CHEST - 2 VIEW COMPARISON:  01/14/2018; 12/07/2017; chest CT-01/06/2018 FINDINGS: Grossly unchanged enlarged cardiac silhouette and mediastinal contours with atherosclerotic plaque within the thoracic aorta. Post median sternotomy and CABG. The pulmonary vasculature is indistinct with cephalization of flow. Rather extensive bilateral mid and lower lung heterogeneous/consolidative opacities. Trace pleural effusions are not excluded. No pneumothorax. No acute osseus abnormalities. IMPRESSION: Findings most worrisome for alveloar pulmonary edema though note, multifocal infection could have a similar appearance. Clinical correlation is advised. Electronically Signed   By: Sandi Mariscal M.D.   On: 01/15/2018 13:46    ASSESSMENT / PLAN: 1. Acute Hypoxic respiratory failure-in the setting  of decompensated congestive heart failure.  Patient is immunosuppressed with low-grade temp and mildly elevated white blood cell count.  Suspect has secondary PNA  Doubt PE as recent VQ and CT w/ contrast neg . Compliant w/ Eliquis .  Concern with recent chemo , unlikely a  pneumonitis or aspiration as finished chemo several weeks ago and swallowing was improving .   Plan  Begin IV antibiotics with vancomycin and cefepime Begin BiPAP support Continue diuresis as blood pressure and kidneys allow Check lactate and procalcitonin Pan culture .  Check CT chest pending , if pleural effusion increased may need thoracentesis .   2. DVT Left   Plan  Cont Eliquis   3. CHF   Plan  Cont diuresis .    Rexene Edison NP-C  Pulmonary and [place] Pager: [phone]  01/16/2018, 12:50 PM

## 2018-01-16 NOTE — Progress Notes (Signed)
Called Justin Blackburn with Pulmonology about the new Bipap orders. Upon arrival to the patients room he was eating lunch. Bipap set up but will hold for an hour per [facility] PA. Rn aware & will call when patient returns to the room from CT.

## 2018-01-16 NOTE — Progress Notes (Signed)
Pt placed on Bipap as ordered. Pt is tolerating well at this time. Pt's wife & family at the bedside.

## 2018-01-16 NOTE — Progress Notes (Signed)
Pharmacy Antibiotic Note  Justin Blackburn. is a 67 y.o. male admitted on 01/14/2018 with pneumonia/respiratory distress.  Pharmacy has been consulted for vancomycin and cefepime dosing.  Low grade temp overnight, wbc now normal at 10. Possibly immunosuppression given recent chemo and radiation so starting on broad IV antibiotics.   Plan: Vancomycin 2g IV now then 1g q12 hours Cefepime 1g q8 hours  Height: 6' (182.9 cm) Weight: 191 lb 12.8 oz (87 kg) IBW/kg (Calculated) : 77.6  Temp (24hrs), Avg:99.4 F (37.4 C), Min:98.8 F (37.1 C), Max:100.6 F (38.1 C)  Recent Labs  Lab 01/14/18 0707 01/14/18 0718 01/14/18 0938 01/14/18 1227 01/14/18 1534 01/15/18 0326 01/16/18 0210  WBC 12.3*  --   --   --   --  10.1 10.6*  CREATININE 1.25* 1.10  --   --   --  1.05 0.93  LATICACIDVEN  --  2.31* 2.61* 1.6 1.7  --   --     Estimated Creatinine Clearance: 82.3 mL/min (by C-G formula based on SCr of 0.93 mg/dL).    No Known Allergies   Thank you for allowing pharmacy to be a part of this patient's care.  Erin Hearing PharmD., BCPS Clinical Pharmacist 01/16/2018 1:34 PM

## 2018-01-16 NOTE — Progress Notes (Signed)
Subjective: Justin Blackburn still feels that his breathing is comfortable today. He denies new physical symptoms. He does express concerns for his oxygen requirement isn't improving. He has had good urine output.  His wife is at bedside they are both updated on the plan for obtaining CT scan. We discussed the concern for other differentials of the pulmonary infiltrates including alveolar hemorrhage and infection based on chest x-ray findings. The family request that we consult with pulmonology today.  Objective:  Vital signs in last 24 hours: Vitals:   01/16/18 0600 01/16/18 0731 01/16/18 1004 01/16/18 1119  BP:  127/71  115/68  Pulse: 80 81  73  Resp: (!) 22 (!) 30  (!) 28  Temp:  99.1 F (37.3 C)  99 F (37.2 C)  TempSrc:  Oral  Oral  SpO2: 95% 91% 91% 92%  Weight: 191 lb 12.8 oz (87 kg)     Height:       General: Comfortable appearing, no acute distress Cardiac: Regular rate and rhythm, no murmurs, normal S1 and S2, JVD, 1+ bilateral lower extremity pitting edema to the knees in left leg greater than right Pulm: on non rebreather, some accessory muscle use, bronchial breath sounds throughout GI: abdomen is soft, non tender, non distended   Assessment/Plan:  67 year old man with history of chronic combined CHF secondary to ischemic cardiomyopathy with DES to the LAD and LCx, esophogeal adenocarcinoma s/p chemoradiation, afib/flutter. He was admit for volume overload and hypoxic respiratory failure.   Acute hypoxic respiratory failure Supplemental oxygen requirement has failed to improve despite 5 L of diuresis.  Chest x-ray yesterday showed an alveolar pattern of pulmonary infiltrates concerning for other etiologies such as pulmonary hemorrhage or infection. On admission he was noted to have an acute anemia and has now dropped 3 g in 1 month.  Overnight he developed a temperature 100.6.  -Pulmonology has been consulted for question of whether bronchoscopy would be helpful to better  differentiate the cause of this hypoxic respiratory failure which is not responding appropriately to diuresis. Our hope is that with increasing the rate of diuresis we may obtain a level of supplemental oxygen requirement which would be more conducive for bronch.  - If he were to develop another fever we would start him on broad spectrum antibiotics after obtaining blood cultures   Acute on chronic combined systolic and diastolic congestive heart failure secondary to ischemic cardiomyopathy   Acute pulmonary edema (HCC) - 1.6 liters of urine output yesterday with IV lasix 40 mg BID, he is now net negative 5 liters this admission  - recorded weight is down 17 pounds since the time of admission   - creatinine continues to improve  - potassium and magnesium were within the normal range this morning   - continue home coreg, plavix, and statin    - not on ACE/ ARB due to orthostatic hypotension in the past, we should resume this at a very low dose when he is stable  - cardiology is on board, we appreciate their assistance in the care of this patient   Type 2 NSTEMI - troponin peaked at 0.10  - type 2 and secondary to demand ischemia from volume overload  - remains chest pain free     Atrial fibrillation, paroxysmal ([place]) - converted to NSR with PACs  - continue amio 200 mg and carvedilol 3.125 twice daily daily for rate control -Continue Eliquis for stroke prophylaxis and DVT as noted below -Continue electrolyte and telemetry  monitoring  Microcytic anemia  - obtain ferritin  - continue daily iron supplementation - monitor CBC     History of DVT (deep vein thrombosis) - continue eliquis     Diabetes mellitus type 2, insulin dependent (HCC) -Blood glucose 70- 200 over the past 24 hours -Continue blood glucose monitoring and SSI    Pressure injury of skin  Malnutrition of moderate degree -Dietitian following, we appreciate their assistance in the care of this patient  Dispo:  Anticipated discharge in approximately 3-5 day(s).   Justin Noss, MD 01/16/2018, 2:16 PM Pager: [phone]

## 2018-01-16 NOTE — NC FL2 (Signed)
[place] LEVEL OF CARE SCREENING TOOL     IDENTIFICATION  Patient Name: Justin Blackburn. Birthdate: 1951-04-13 Sex: male Admission Date (Current Location): 01/14/2018  [facility] and [place] Number:  Herbalist and Address:  [facility], [place] [address], [place],  [address]      Provider Number: [id number]  Attending Physician Name and Address:  Annia Belt, MD  Relative Name and Phone Number:       Current Level of Care: Hospital Recommended Level of Care: [place] Prior Approval Number:    Date Approved/Denied:   PASRR Number:    Discharge Plan: SNF    Current Diagnoses: Patient Active Problem List   Diagnosis Date Noted  . History of DVT (deep vein thrombosis) 01/15/2018  . Malnutrition of moderate degree 01/15/2018  . Acute congestive heart failure ([place]) 01/14/2018  . Pressure injury of skin 01/14/2018  . Acute pulmonary edema (HCC)   . Diabetes mellitus type 2, insulin dependent ([place])   . Esophageal cancer ([place]) 12/20/2017  . DVT (deep venous thrombosis) ([place]) 12/07/2017  . AKI (acute kidney injury) ([place]) 12/07/2017  . Atrial fibrillation, persistent ([place]) 12/06/2017  . Postural dizziness with presyncope 12/06/2017  . Encounter for antineoplastic chemotherapy 11/11/2017  . 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 Atrial flutter ([place]) 09/01/2017  . Elevated troponin   . Iron deficiency anemia   . Coronary artery disease involving coronary bypass graft of native heart without angina pectoris 08/28/2016  . 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 Anticoagulated 02/06/2015  . Acute on chronic combined systolic and diastolic heart failure ([place]) 01/30/2015  . Demand ischemia ([place]) 01/30/2015  . Chronic combined systolic and diastolic heart failure, NYHA class 2 ([place]) 04/17/2013  . Umbilical hernia   . Myocardial infarction  ([place])   . Peripheral vascular disease ([place])   . Hypercholesteremia   . Hypertension 09/01/2011  . Diabetes mellitus ([place]) 09/01/2011    Orientation RESPIRATION BLADDER Height & Weight     Self, Time, Situation, Place  Other (Comment)(Non-rebreather) Continent Weight: 191 lb 12.8 oz (87 kg) Height:  6' (182.9 cm)  BEHAVIORAL SYMPTOMS/MOOD NEUROLOGICAL BOWEL NUTRITION STATUS      Continent Diet(heart healthy/carb modified, thin liquids, liquid restriction, 1800 mL)  AMBULATORY STATUS COMMUNICATION OF NEEDS Skin   Extensive Assist Verbally Normal, PU Stage and Appropriate Care(Closed incision left knee) PU Stage 1 Dressing: ([place])                     Personal Care Assistance Level of Assistance  Bathing, Feeding, Dressing Bathing Assistance: Maximum assistance Feeding assistance: Independent Dressing Assistance: Maximum assistance     Functional Limitations Info  Sight, Hearing, Speech Sight Info: Adequate Hearing Info: Adequate Speech Info: Adequate    SPECIAL CARE FACTORS FREQUENCY  PT (By licensed PT), OT (By licensed OT)     PT Frequency: 2x OT Frequency: 2x            Contractures Contractures Info: Not present    Additional Factors Info  Code Status, Allergies Code Status Info: Full Code Allergies Info: NO known allergies           Current Medications (01/16/2018):  This is the current hospital active medication list Current Facility-Administered Medications  Medication Dose Route Frequency Provider Last Rate Last Dose  . acetaminophen (TYLENOL) tablet 650 mg  650 mg Oral Q6H PRN  Alphonzo Grieve, MD       Or  . acetaminophen (TYLENOL) suppository 650 mg  650 mg Rectal Q6H PRN Alphonzo Grieve, MD      . amiodarone (PACERONE) tablet 200 mg  200 mg Oral Daily Alphonzo Grieve, MD   200 mg at 01/16/18 1124  . apixaban (ELIQUIS) tablet 5 mg  5 mg Oral BID Alphonzo Grieve, MD   5 mg at 01/16/18 1123  . atorvastatin (LIPITOR) tablet 40 mg  40 mg Oral q1800  Alphonzo Grieve, MD   40 mg at 01/15/18 1800  . carvedilol (COREG) tablet 3.125 mg  3.125 mg Oral BID WC Alphonzo Grieve, MD   3.125 mg at 01/16/18 0739  . ceFEPIme (MAXIPIME) 1 g in sodium chloride 0.9 % 100 mL IVPB  1 g Intravenous Q8H Lyndee Leo, RPH      . clopidogrel (PLAVIX) tablet 75 mg  75 mg Oral Daily Alphonzo Grieve, MD   75 mg at 01/16/18 1124  . furosemide (LASIX) injection 80 mg  80 mg Intravenous BID Ledell Noss, MD      . gabapentin (NEURONTIN) capsule 300 mg  300 mg Oral BID Alphonzo Grieve, MD   300 mg at 01/16/18 1124  . insulin aspart (novoLOG) injection 0-9 Units  0-9 Units Subcutaneous TID WC Alphonzo Grieve, MD   1 Units at 01/15/18 1759  . pantoprazole (PROTONIX) EC tablet 40 mg  40 mg Oral Daily Alphonzo Grieve, MD   40 mg at 01/16/18 1123  . polyethylene glycol (MIRALAX / GLYCOLAX) packet 17 g  17 g Oral Daily PRN Alphonzo Grieve, MD      . sucralfate (CARAFATE) tablet 1 g  1 g Oral TID WC & HS Alphonzo Grieve, MD   1 g at 01/16/18 1129  . vancomycin (VANCOCIN) 2,000 mg in sodium chloride 0.9 % 500 mL IVPB  2,000 mg Intravenous Once Lyndee Leo, RPH      . [START ON 01/17/2018] vancomycin (VANCOCIN) IVPB 1000 mg/200 mL premix  1,000 mg Intravenous Q12H Lyndee Leo, [facility]         Discharge Medications: Please see discharge summary for a list of discharge medications.  Relevant Imaging Results:  Relevant Lab Results:   Additional Information SSN: [id number]  Eileen Stanford, LCSW

## 2018-01-17 ENCOUNTER — Inpatient Hospital Stay: Payer: Medicare HMO

## 2018-01-17 DIAGNOSIS — I255 Ischemic cardiomyopathy: Secondary | ICD-10-CM

## 2018-01-17 DIAGNOSIS — E11622 Type 2 diabetes mellitus with other skin ulcer: Secondary | ICD-10-CM

## 2018-01-17 DIAGNOSIS — I480 Paroxysmal atrial fibrillation: Secondary | ICD-10-CM

## 2018-01-17 DIAGNOSIS — I21A1 Myocardial infarction type 2: Secondary | ICD-10-CM

## 2018-01-17 DIAGNOSIS — L98499 Non-pressure chronic ulcer of skin of other sites with unspecified severity: Secondary | ICD-10-CM

## 2018-01-17 DIAGNOSIS — E440 Moderate protein-calorie malnutrition: Secondary | ICD-10-CM

## 2018-01-17 DIAGNOSIS — J8410 Pulmonary fibrosis, unspecified: Secondary | ICD-10-CM

## 2018-01-17 DIAGNOSIS — D509 Iron deficiency anemia, unspecified: Secondary | ICD-10-CM

## 2018-01-17 LAB — POCT I-STAT 3, ART BLOOD GAS (G3+)
ACID-BASE EXCESS: 9 mmol/L — AB (ref 0.0–2.0)
Bicarbonate: 31 mmol/L — ABNORMAL HIGH (ref 20.0–28.0)
O2 SAT: 98 %
TCO2: 32 mmol/L (ref 22–32)
pCO2 arterial: 31.2 mmHg — ABNORMAL LOW (ref 32.0–48.0)
pH, Arterial: 7.605 (ref 7.350–7.450)
pO2, Arterial: 81 mmHg — ABNORMAL LOW (ref 83.0–108.0)

## 2018-01-17 LAB — BASIC METABOLIC PANEL
ANION GAP: 8 (ref 5–15)
BUN: 17 mg/dL (ref 8–23)
CO2: 31 mmol/L (ref 22–32)
Calcium: 7.5 mg/dL — ABNORMAL LOW (ref 8.9–10.3)
Chloride: 97 mmol/L — ABNORMAL LOW (ref 98–111)
Creatinine, Ser: 0.94 mg/dL (ref 0.61–1.24)
GFR calc non Af Amer: >60 mL/min (ref >60)
GLUCOSE: 102 mg/dL — AB (ref 70–99)
Potassium: 3.4 mmol/L — ABNORMAL LOW (ref 3.5–5.1)
Sodium: 136 mmol/L (ref 135–145)

## 2018-01-17 LAB — GLUCOSE, CAPILLARY
GLUCOSE-CAPILLARY: 101 mg/dL — AB (ref 70–99)
GLUCOSE-CAPILLARY: 132 mg/dL — AB (ref 70–99)
Glucose-Capillary: 102 mg/dL — ABNORMAL HIGH (ref 70–99)
Glucose-Capillary: 113 mg/dL — ABNORMAL HIGH (ref 70–99)

## 2018-01-17 LAB — BLOOD GAS, ARTERIAL
Acid-Base Excess: 9.5 mmol/L — ABNORMAL HIGH (ref 0.0–2.0)
Bicarbonate: 33.3 mmol/L — ABNORMAL HIGH (ref 20.0–28.0)
DELIVERY SYSTEMS: POSITIVE
EXPIRATORY PAP: 6
FIO2: 60
INSPIRATORY PAP: 12
O2 Saturation: 89.5 %
PO2 ART: 57.5 mmHg — AB (ref 83.0–108.0)
Patient temperature: 98.6
pCO2 arterial: 43.3 mmHg (ref 32.0–48.0)
pH, Arterial: 7.498 — ABNORMAL HIGH (ref 7.350–7.450)

## 2018-01-17 LAB — SEDIMENTATION RATE: Sed Rate: 73 mm/hr — ABNORMAL HIGH (ref 0–16)

## 2018-01-17 LAB — TROPONIN I: Troponin I: 0.04 ng/mL (ref <0.03)

## 2018-01-17 LAB — MAGNESIUM: Magnesium: 1.8 mg/dL (ref 1.7–2.4)

## 2018-01-17 MED ORDER — CHLORHEXIDINE GLUCONATE 0.12 % MT SOLN
15.0000 mL | Freq: Two times a day (BID) | OROMUCOSAL | Status: DC
  Administered 2018-01-17 – 2018-01-25 (×13): 15 mL via OROMUCOSAL
  Filled 2018-01-17 (×12): qty 15

## 2018-01-17 MED ORDER — ENSURE ENLIVE PO LIQD: 237.0000 mL | Freq: Two times a day (BID) | ORAL | Status: DC

## 2018-01-17 MED ORDER — SODIUM CHLORIDE 0.9 % IV SOLN
500.0000 mg | INTRAVENOUS | Status: DC
  Administered 2018-01-17 – 2018-01-18 (×2): 500 mg via INTRAVENOUS
  Filled 2018-01-17 (×3): qty 500

## 2018-01-17 MED ORDER — ORAL CARE MOUTH RINSE
15.0000 mL | Freq: Two times a day (BID) | OROMUCOSAL | Status: DC
  Administered 2018-01-18 – 2018-01-20 (×3): 15 mL via OROMUCOSAL

## 2018-01-17 MED ORDER — METHYLPREDNISOLONE SODIUM SUCC 40 MG IJ SOLR
40.0000 mg | Freq: Four times a day (QID) | INTRAMUSCULAR | Status: DC
  Administered 2018-01-17 – 2018-01-18 (×4): 40 mg via INTRAVENOUS
  Filled 2018-01-17 (×4): qty 1

## 2018-01-17 MED ORDER — MAGNESIUM SULFATE 2 GM/50ML IV SOLN
2.0000 g | Freq: Once | INTRAVENOUS | Status: AC
  Administered 2018-01-17: 2 g via INTRAVENOUS
  Filled 2018-01-17: qty 50

## 2018-01-17 MED ORDER — POTASSIUM CHLORIDE CRYS ER 20 MEQ PO TBCR
40.0000 meq | EXTENDED_RELEASE_TABLET | Freq: Once | ORAL | Status: AC
  Administered 2018-01-17: 40 meq via ORAL
  Filled 2018-01-17: qty 2

## 2018-01-17 MED ORDER — ORAL CARE MOUTH RINSE
15.0000 mL | Freq: Two times a day (BID) | OROMUCOSAL | Status: DC
  Administered 2018-01-18 – 2018-01-19 (×2): 15 mL via OROMUCOSAL

## 2018-01-17 NOTE — Progress Notes (Addendum)
Name: Justin Blackburn. MRN: [id number] DOB: 02-Aug-1950    ADMISSION DATE:  01/14/2018 CONSULTATION DATE:  8/4  REFERRING MD :  [facility] /Dr. Virgina Jock   CHIEF COMPLAINT:  Hypoxia   BRIEF PATIENT DESCRIPTION: 67 year old male with minimal smoking history admitted August 2 with decompensated congestive heart failure.  Patient has esophageal cancer status post recent chemo and XRT.  Patient with progressive hypoxemia despite diuresis.  Pulmonary consulted August 4  SIGNIFICANT EVENTS    STUDIES:  2D echo 8/2> EF 45 to 50%, moderate dilation of right ventricle, PAP 44  Prior to admit--- Venous Doppler December 07, 2017 acute DVT left leg common femoral vein, femoral, popliteal, posterior tibial, peroneal, left external iliac.  PET scan 4/4> mild to moderate hyper metabolic and distal esophagus.  No findings for pulmonary or hepatic metastatic disease.  No adenopathy.  VQ scan 6/25 normal, no evidence of PE  CT chest with contrast 7/25 no evidence for metastatic adenopathy.  Small bilateral pleural effusions with interstitial and pulmonary edema.  Radiation effects of the distal esophageal   HISTORY OF PRESENT ILLNESS:   67 year old male with minimal smoking history with recent diagnosis (09/2017)  of esophageal adenocarcinoma status post chemo and radiation.  Patient has a history of coronary artery disease status post CABG in 1998 and subsequent redo in 2007.  Stents placed in 2013.  And March 2019.  Plavix started in March 2019.  Since radiation patient has significant difficulty eating with weight loss.  As of recent says that that has improved some.  He has no nausea or vomiting. Was diagnosed with a extensive DVT on the left leg December 07, 2017 started on Eliquis.  During this timeframe he started having increased shortness of breath.  A VQ scan was done that was normal with no evidence of PE.  Patient had a follow-up CT chest January 06, 2018 that showed no evidence of metastatic adenopathy.  There was  small bilateral pleural effusions with interstitial and pulmonary edema that appeared to be somewhat new.  Along with radiation effects of the along the distal esophagus.  Patient says the night before current admission that he slipped off the bed with no apparent perceived injury.  However the next morning patient became more more short of breath.  He was transferred via EMS from home to the emergency room.  Felt to have decompensated congestive heart failure.  Previous echo showed EF 45 to 50%.  Echo was repeated with no significant change this admission patient has been diuresed since admission with a -5 L removed. White count is slightly elevated.  And he has developed some low-grade temps.  Lactic acid 1.7 on admission.  Chest x-ray yesterday showed bilateral opacities concerning for edema.  Oxygen demands have increased progressively and now patient is requiring a nonrebreather to keep O2 saturations at 90%.  On arrival patient is in the bed alert and oriented follows commands.  And answers questions appropriately.  O2 saturations are 89 to 90% on 100% nonrebreather mask.  Wife is at bedside been updated  SUBJECTIVE:  Pt with episode today of resp distress and collapse . He did not lose his pulse, O2 dropped in 70s and became unresponsive. He was placed on BIPAP with improvement in O2 sats and mentation.  Currently awake in bed in BIPAP alert and following commands. O2 sast mid 90s.    VITAL SIGNS: Temp:  [97.8 F (36.6 C)-100.9 F (38.3 C)] 98.5 F (36.9 C) (08/05 1138) Pulse Rate:  [  47-99] 80 (08/05 1300) Resp:  [17-35] 35 (08/05 1300) BP: (89-128)/(49-86) 103/65 (08/05 1300) SpO2:  [86 %-100 %] 94 % (08/05 1300) Weight:  [185 lb 10 oz (84.2 kg)] 185 lb 10 oz (84.2 kg) (08/05 0600)  PHYSICAL EXAMINATION: General: chronically ill appearing on BIPAP  Neuro: intact, a/ox 3  HEENT: AT/[place] , mm  Cardiovascular: RRR , SM , 1-2 edema  Lungs: BB crackles  Abdomen: obese, soft, BS +    Musculoskeletal: intact  Skin: no rash   Recent Labs  Lab 01/15/18 0326 01/16/18 0210 01/17/18 0223  NA 135 135 136  K 3.3*  3.2* 3.8 3.4*  CL 100 98 97*  CO2 _0 BUN _1 CREATININE 1.05 0.93 0.94  GLUCOSE 91 126* 102*   Recent Labs  Lab 01/14/18 0707 01/14/18 0718 01/15/18 0326 01/16/18 0210  HGB 10.1* 11.6* 9.1* 9.5*  HCT 32.3* 34.0* 28.3* 30.3*  WBC 12.3*  --  10.1 10.6*  PLT 398  --  329 321   Ct Chest Wo Contrast  Result Date: 01/16/2018 CLINICAL DATA:  Acute respiratory failure with hypoxemia, acute congestive heart failure, possible healthcare associated pneumonia. Esophageal adenocarcinoma. EXAM: CT CHEST WITHOUT CONTRAST TECHNIQUE: Multidetector CT imaging of the chest was performed following the standard protocol without IV contrast. COMPARISON:  Chest radiographs dated 01/15/2018. CT chest dated 01/06/2018. FINDINGS: Cardiovascular: Cardiomegaly.  No pericardial effusion. No evidence of thoracic aortic aneurysm. Atherosclerotic calcifications of the aortic arch. Three vessel coronary atherosclerosis. Postsurgical changes related to prior CABG. Mediastinum/Nodes: Mild mediastinal lymphadenopathy, including a dominant 15 mm short axis subcarinal node and small right paratracheal nodes measuring up to 10 mm. While this appearance may be reactive given the additional findings, this is certainly nonspecific in the setting of esophageal cancer. Mild diffuse wall thickening involving the distal esophagus (series 3/image 118) with high density/calcifications at the GE junction (series 3/image 125), favoring post treatment changes. Visualized thyroid is unremarkable. Lungs/Pleura: Multifocal patchy/ground-glass opacities throughout the lungs, favoring interstitial edema. However, additional patchy lower lobe opacities with air bronchograms (series 4/image 97) raise the possibility of superimposed pneumonia. Small bilateral pleural effusions, right greater than left. No  pneumothorax. Upper Abdomen: Visualized upper abdomen is grossly unremarkable. Musculoskeletal: Degenerative changes of the visualized thoracolumbar spine. Median sternotomy. IMPRESSION: Cardiomegaly with interstitial edema and small bilateral pleural effusions, right greater than left. Additional patchy bilateral lower lobe opacities, possibly reflecting superimposed pneumonia. Mild mediastinal lymphadenopathy, including a 15 mm short axis subcarinal node, indeterminate. Suspected post treatment changes involving the distal esophagus/GE junction. Aortic Atherosclerosis (ICD10-I70.0). Electronically Signed   By: Julian Hy M.D.   On: 01/16/2018 17:48    ASSESSMENT / PLAN: 1. Acute Hypoxic respiratory failure-in the setting of decompensated congestive heart failure.  Patient is immunosuppressed with low-grade temp and mildly elevated white blood cell count.  Suspect has secondary PNA  Doubt PE as recent VQ and CT w/ contrast neg . Compliant w/ Eliquis .  Concern with recent chemo , unlikely a  pneumonitis or aspiration as finished chemo several weeks ago and swallowing was improving .   8/5 >LA 2.61>1.7>2.0 , PCT 0.21  CT chest 8/4 >sm B effusion R>>L , interstitial edema , patchy BB opacities w/ air bronchograms  Episode of collapse and severe hypoxia - improved with BIPAP   Plan  Vanc /Cefepime added 8/4  Azithr added 8/5  Follow CX data  Cont diuresis as b/p and scr allow .  Pan culture . Sander Nephew  ag  pending   check EKG and troponin  Transfer to ICU   2. DVT Left   Plan  Cont Eliquis   3. CHF  8/5 >neg 7 L bal , wt down 16lb since admit  Plan  Cont diuresis   4. A Fib  On Amiodarone -check ESR  ? amio toxicity .   Plan  Trial of steroids -Solumedrol 39m q6  Hold amioadarone .   4. Hypokalemia  Hypomagesium   Plan  K and Mg replaced.     Tammy Parrett NP-C  Pulmonary and CLindsayPager: [phone] 01/17/2018, 1:44 PM    Intensivist The patient presents with a wroseningn dyspnea in the past few days. He has diffusse alveolor and interstial infiltrates. The patient is requiring high FI02. These changes have developed despite a an aggressive attempt to diureses thepatient over the oast several days. The patient has not had chemotherrapy in at least 6 weeks. He ran a low grade fever yesterday of 100-101. He was started empirically on broad spectrum abx. The patient had been on Amiodarone 208mbid since June  When he developed rapid atrial fib at theptimre of a previous admission. I do not believe this represents heart failure. These changes are unlikely related to his malignanncy as the patient did not have these parenchymal changes 1 week ago by scan. therfore I am inclined to think this is a repasiratory infx (not bringing up sputum or possible drug toxicity inparticualkr amidodarone. We will stop amiodarone and start IV steroids. The aptient will be moved to the ICU for closer monitring since it appears there is a good chance he will need intubation in the next 48 hours. His family is aware of this possibility. If thepatient requires intubation I would proceed woith diagnostic BAL. I have spoken extensively to Mr. NeWheelingxtended family about the current situation and management.  RoShary Keyulmonary/Criticslcare [phone]

## 2018-01-17 NOTE — Progress Notes (Signed)
RT obtained ABG but due to small amount it was ran on Istat. Results are not crossing over. Results are as follows on bipap 12/6 60% FiO2: pH 7.60, CO2 31.2, PaO2 81, HCO3 31 and Sat 98. Results reported to RN at 3:15 on 01/17/2018

## 2018-01-17 NOTE — Progress Notes (Signed)
OT Cancellation Note  Patient Details Name: Justin Blackburn. MRN: [id number] DOB: 12/01/1950   Cancelled Treatment:    Reason Eval/Treat Not Completed: Medical issues which prohibited therapy. (Pt on continuous BiPAP due to low SpO2. RN requesting to hold therapy today reporting that MD is discussing plan of care with family and possible transfer to ICU.)  [place], OTR/L Acute Rehab Pager: [phone] Office: [phone] 01/17/2018, 3:49 PM

## 2018-01-17 NOTE — Progress Notes (Signed)
Reported by wife that pt. became unconscious unable  to Oliver much time. RN went to see pt.desats to 70's and 80's. Bp103/65. Placed on bipap,  regained consciousness, MD aware seen pt at once. Sat-98% cont. To monitor.

## 2018-01-17 NOTE — Progress Notes (Signed)
Subjective: Justin Blackburn feels about the same this morning. He is laying in bed with a partial nonrebreather and O2 saturations in the low to mid 90s but desats < 80% during exam. He is encouraged with his urine output and understands that he might have a pulmonary infection in addition to the pulmonary edema. It is too soon to tell if the antibiotics are making a difference. His wife remains very concerned and is eager for more details. We offered reassurance and recommended taking this one day at a time.    Objective:  Vital signs in last 24 hours: Vitals:   01/17/18 0500 01/17/18 0600 01/17/18 0753 01/17/18 0800  BP: 103/66 116/60 116/60   Pulse:  (!) 54 81   Resp:  (!) 21 (!) 25   Temp: 97.8 F (36.6 C)   98.1 F (36.7 C)  TempSrc: Axillary   Oral  SpO2:  93% 93%   Weight:  185 lb 10 oz (84.2 kg)    Height:       Gen: Frail appearing, NAD, laying in bed on nonrebreather  ENT: OP with small amount of exudate.  CV: RRR, no murmurs, no JVD Pulm: Increased effort, fine, dry crackles throughout posterior lung fields, worse at bilateral bases, no wheezing Abd: Soft, NT, ND, normal BS.  Ext: Warm, 1-2+ pitting lower extremity edema to ankle, left > right   Assessment/Plan:  Justin Blackburn is a 67 yo male with history of chronic combined CHF secondary to ischemic cardiomyopathy with DES to the LAD and LCx in Feb 2019 and s/p CABG x2, esophogeal adenocarcinoma s/p chemoradiation, afib/flutter who presented with hypoxemia to 66% on RA and was admitted for volume overload and hypoxic respiratory failure.   #Acute hypoxic respiratory failure: Still requiring nonrebreather. Maintaining saturations > 90% at rest but drops to 78% with movement. Diuresing effectively but O2 saturations are not improving as expected, despite 7L urine output. Chest CT with interstitial edema, small bilateral pleural effusions, and additional patchy/ground glass bilateral lower lobe opacities with air bronchograms  concerning for superimposed pneumonia. Vanc and cefepime were started 8/4. Last fevered evening of 8/4.  - Pulmonology following: not a candidate for BAL given hypoxemia. Rec'd antibiotics - Continue diuresis Lasix 80mg  IV BID - Continue cefepime.  - D/c vancomycin, unlikely MRSA.  - START azithromycin for atypical coverage given patient's immunocompromised status - BCx pending - If he were to develop another fever we would start him on broad spectrum antibiotics after obtaining blood cultures   #Acute on chronic combined systolic and diastolic congestive heart failure secondary to ischemic cardiomyopathy   Acute pulmonary edema (HCC) - 3.8 liters of urine output yesterday with increased IV lasix 80 mg BID, he is now net negative 7 liters this admission  - recorded weight is down 19 pounds since the time of admission   - creatinine has stabilized - Replete K+ and Mg+ as needed - continue home coreg, plavix, and statin    - not on ACE/ ARB due to orthostatic hypotension in the past, we should resume this at a very low dose when he is stable  - cardiology is on board, we appreciate their assistance in the care of this patient. Recommend continuing diuresis  #Type 2 NSTEMI - troponin peaked at 0.10  - type 2 and secondary to demand ischemia from volume overload  - remains chest pain free   #Atrial fibrillation, paroxysmal ([place]) - converted to NSR with PACs  - continue amio 200 mg and  carvedilol 3.125 twice daily for rate control -Continue Eliquis for stroke prophylaxis and DVT as noted below -Continue electrolyte and telemetry monitoring  #Microcytic anemia  - ferritin elevated, but unreliable as it is an acute phase reactant  - continue daily iron supplementation - monitor CBC   #History of DVT (deep vein thrombosis) - continue eliquis   #Diabetes mellitus type 2, insulin dependent (HCC) -Blood glucose 70- 200 over the past 24 hours -Continue blood glucose monitoring and  SSI  #Pressure injury of skin  Malnutrition of moderate degree -Dietitian following, we appreciate their assistance in the care of this patient  Dispo: Anticipated discharge in approximately 3-5 day(s).   [name], MD 01/17/2018, 11:03 AM Pager: [phone]

## 2018-01-17 NOTE — Progress Notes (Signed)
Desats to 80's on 5L [place] for breakfast, maintained on   Non rebreathing mask, with sat 92%

## 2018-01-17 NOTE — Progress Notes (Signed)
Transferred to MICU bed 8 by bed. Report given to RN,  Belongings with family.

## 2018-01-17 NOTE — Progress Notes (Signed)
Progress Note  Patient Name: Justin Blackburn. Date of Encounter: 01/17/2018  Primary Cardiologist: [name], MD   Subjective   67 year old gentleman with a history of coronary artery disease, coronary artery bypass grafting and stenting of the left main in March, 2019.  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  He has paroxysmal atrial fibrillation and has been on Eliquis.  He has not missed any doses of Eliquis recently.  Patient was seen in consultation for congestive heart failure and atrial fib at the request of Dr. Beryle Beams.    The patient has converted back to sinus rhythm at this time. He is diuresed 7.3 L so far during this admission.  He still requiring fairly high flow oxygen.  His O2 sats have ranged between 91 and 96 this morning.  Inpatient Medications    Scheduled Meds: . amiodarone  200 mg Oral Daily  . apixaban  5 mg Oral BID  . atorvastatin  40 mg Oral q1800  . carvedilol  3.125 mg Oral BID WC  . clopidogrel  75 mg Oral Daily  . feeding supplement (ENSURE ENLIVE)  237 mL Oral BID BM  . furosemide  80 mg Intravenous BID  . gabapentin  300 mg Oral BID  . insulin aspart  0-9 Units Subcutaneous TID WC  . pantoprazole  40 mg Oral Daily  . potassium chloride  40 mEq Oral Once  . sucralfate  1 g Oral TID WC & HS   Continuous Infusions: . azithromycin    . ceFEPime (MAXIPIME) IV 1 g (01/17/18 0516)  . magnesium sulfate 1 - 4 g bolus IVPB     PRN Meds: acetaminophen **OR** acetaminophen, polyethylene glycol   Vital Signs    Vitals:   01/17/18 0500 01/17/18 0600 01/17/18 0753 01/17/18 0800  BP: 103/66 116/60 116/60   Pulse:  (!) 54 81   Resp:  (!) 21 (!) 25   Temp: 97.8 F (36.6 C)   98.1 F (36.7 C)  TempSrc: Axillary   Oral  SpO2:  93% 93%   Weight:  185 lb 10 oz (84.2 kg)    Height:        Intake/Output Summary (Last 24 hours) at 01/17/2018 0823 Last data filed at 01/16/2018 2150 Gross per 24  hour  Intake 1770.72 ml  Output 3550 ml  Net -1779.28 ml   Filed Weights   01/15/18 0322 01/16/18 0600 01/17/18 0600  Weight: 201 lb 11.5 oz (91.5 kg) 191 lb 12.8 oz (87 kg) 185 lb 10 oz (84.2 kg)    Telemetry    NSR with PACs  - Personally Reviewed  ECG       Physical Exam   GEN:  Elderly gentleman, mild respiratory distress. Neck: No JVD Cardiac:  Regular rate with frequent premature beats. Respiratory:  rales bilaterally.  Fine rales c/w possible pulmonary fibrosis in addition to typical pulmonary edema rales.  GI: Soft, nontender, non-distended  MS:  2+ pitting edema bilaterally. Neuro:  Nonfocal  Psych: Normal affect   Labs    Chemistry Recent Labs  Lab 01/14/18 0707  01/15/18 0326 01/16/18 0210 01/17/18 0223  NA 131*   < > 135 135 136  K 4.4   < > 3.3*  3.2* 3.8 3.4*  CL 98   < > 100 98 97*  CO2 22  --  27 28 31   GLUCOSE 174*   < > 91 126* 102*  BUN 21   < >  18 17 17   CREATININE 1.25*   < > 1.05 0.93 0.94  CALCIUM 8.2*  --  7.6* 7.7* 7.5*  PROT 5.9*  --   --   --   --   ALBUMIN 1.8*  --   --   --   --   AST 43*  --   --   --   --   ALT 25  --   --   --   --   ALKPHOS 169*  --   --   --   --   BILITOT 0.9  --   --   --   --   GFRNONAA 57*  --  >60 >60 >60  GFRAA >60  --  >60 >60 >60  ANIONGAP 11  --  8 9 8    < > = values in this interval not displayed.     Hematology Recent Labs  Lab 01/14/18 0707 01/14/18 0718 01/15/18 0326 01/16/18 0210  WBC 12.3*  --  10.1 10.6*  RBC 4.27  --  3.79* 4.03*  HGB 10.1* 11.6* 9.1* 9.5*  HCT 32.3* 34.0* 28.3* 30.3*  MCV 75.6*  --  74.7* 75.2*  MCH 23.7*  --  24.0* 23.6*  MCHC 31.3  --  32.2 31.4  RDW 24.0*  --  23.8* 23.9*  PLT 398  --  329 321    Cardiac Enzymes Recent Labs  Lab 01/14/18 0932 01/14/18 1227 01/14/18 2058  TROPONINI 0.07* 0.10* 0.09*    Recent Labs  Lab 01/14/18 0716  TROPIPOC 0.08     BNP Recent Labs  Lab 01/14/18 0707  BNP 781.3*     DDimer No results for input(s):  DDIMER in the last 168 hours.   Radiology    Dg Chest 2 View  Result Date: 01/15/2018 CLINICAL DATA:  Cough and hypoxia. History of atrial fibrillation, CHF, hypertension and diabetes. EXAM: CHEST - 2 VIEW COMPARISON:  01/14/2018; 12/07/2017; chest CT-01/06/2018 FINDINGS: Grossly unchanged enlarged cardiac silhouette and mediastinal contours with atherosclerotic plaque within the thoracic aorta. Post median sternotomy and CABG. The pulmonary vasculature is indistinct with cephalization of flow. Rather extensive bilateral mid and lower lung heterogeneous/consolidative opacities. Trace pleural effusions are not excluded. No pneumothorax. No acute osseus abnormalities. IMPRESSION: Findings most worrisome for alveloar pulmonary edema though note, multifocal infection could have a similar appearance. Clinical correlation is advised. Electronically Signed   By: Sandi Mariscal M.D.   On: 01/15/2018 13:46   Ct Chest Wo Contrast  Result Date: 01/16/2018 CLINICAL DATA:  Acute respiratory failure with hypoxemia, acute congestive heart failure, possible healthcare associated pneumonia. Esophageal adenocarcinoma. EXAM: CT CHEST WITHOUT CONTRAST TECHNIQUE: Multidetector CT imaging of the chest was performed following the standard protocol without IV contrast. COMPARISON:  Chest radiographs dated 01/15/2018. CT chest dated 01/06/2018. FINDINGS: Cardiovascular: Cardiomegaly.  No pericardial effusion. No evidence of thoracic aortic aneurysm. Atherosclerotic calcifications of the aortic arch. Three vessel coronary atherosclerosis. Postsurgical changes related to prior CABG. Mediastinum/Nodes: Mild mediastinal lymphadenopathy, including a dominant 15 mm short axis subcarinal node and small right paratracheal nodes measuring up to 10 mm. While this appearance may be reactive given the additional findings, this is certainly nonspecific in the setting of esophageal cancer. Mild diffuse wall thickening involving the distal esophagus  (series 3/image 118) with high density/calcifications at the GE junction (series 3/image 125), favoring post treatment changes. Visualized thyroid is unremarkable. Lungs/Pleura: Multifocal patchy/ground-glass opacities throughout the lungs, favoring interstitial edema. However, additional patchy lower lobe opacities with  air bronchograms (series 4/image 97) raise the possibility of superimposed pneumonia. Small bilateral pleural effusions, right greater than left. No pneumothorax. Upper Abdomen: Visualized upper abdomen is grossly unremarkable. Musculoskeletal: Degenerative changes of the visualized thoracolumbar spine. Median sternotomy. IMPRESSION: Cardiomegaly with interstitial edema and small bilateral pleural effusions, right greater than left. Additional patchy bilateral lower lobe opacities, possibly reflecting superimposed pneumonia. Mild mediastinal lymphadenopathy, including a 15 mm short axis subcarinal node, indeterminate. Suspected post treatment changes involving the distal esophagus/GE junction. Aortic Atherosclerosis (ICD10-I70.0). Electronically Signed   By: Julian Hy M.D.   On: 01/16/2018 17:48    Cardiac Studies      Patient Profile     67 y.o. male admitted with CHF and atrial fib.  Assessment & Plan    1.  Acute on chronic combined systolic and diastolic congestive heart failure: The patient seems to be making progress.  We have diuresed over 7 L so far.  He still is very short of breath. His lung sounds have fine rales that sound like pulmonary fibrosis in addition to his typical pulmonary edema rales.  I wonder if he has some underlying lung disease as well.   See CT scan from yesterday suggesting possible underlying pneumonia. Continue  to diurese him.  2.  Paroxysmal atrial fibrillation: He is converted back to sinus rhythm.  Continue Eliquis.  3.  Moderate pulmonary hypertension: Estimated PA pressures 44 mmHg.        For questions or updates, please contact  [place] Please consult [email or web address] for contact info under Cardiology/STEMI.      Signed, Mertie Moores, MD  01/17/2018, 8:23 AM

## 2018-01-17 NOTE — Progress Notes (Signed)
Medicine attending: I examined this patient today together with resident physician Dr Francesco Runner and I concur with her evaluation and management plan except for changes noted below. We appreciate input from consultants and Dr. Eppie Gibson.  We agree that given good diuresis over the last 48 hours without significant improvement in oxygenation that there is likely another process responsible other than congestive heart failure.  Empiric antibiotics started.  On exam he is comfortable at rest.  Dry rales heard almost to the apices of the lungs.  CT scan of the chest reviewed with radiologist.  Diffuse bilateral multi lobar interstitial and groundglass infiltrates.  In addition to possibility of opportunistic infection, amiodarone toxicity would fit the clinical and radiographic picture.  Amiodarone was started at time of recent June admission when he had an acute DVT and went back into atrial fibrillation with rapid ventricular response.  He became hypotensive on Cardizem infusion and decision made to start amiodarone for rate control.  Pulmonary hemorrhage seems less likely.  He is on anticoagulation antiplatelet agents.  There has been a modest decline in his hemoglobin compared with value at time of June admission with no further significant decline over the last 72 hours. We will discuss discontinuation of the amiodarone with cardiology.  Current EKG shows he is back in sinus rhythm. Wife present at the bedside.  Status and management plan discussed.

## 2018-01-17 NOTE — Progress Notes (Signed)
CRITICAL VALUE ALERT  Critical Value:  Troponin 0.04  Date & Time Notied:  01/17/18 1730  Provider Notified: Yes  Orders Received/Actions taken: None at this time

## 2018-01-17 NOTE — Significant Event (Signed)
Rapid Response Event Note Pt unresponsive, drop in O2 sats Overview: Time Called: 0932 Arrival Time: 1258 Event Type: Respiratory, Neurologic  Initial Focused Assessment: On arrival pt sitting upright in bed using a urinal. RN reports spouse came out into hall way screaming "I need help in here." On RN's arrival pt was upright in bed slumped over to the left, sats 78% on NRB, Spouse was lifting his arm up and down with no response. Pt will arouse to verbal stimuli but quickly drifts back off.   Interventions: Prior to arrival pt placed back on Bipap, sats 84%, gradually increased to 93%. DR. Hetty Ely to bedside. No interventions from RRT. Tammy PCCM NP also at bedside for routine rounds.  Plan of Care (if not transferred): Continue to monitor pt. Family meeting will be held this afternoon regarding goals of care when everyone arrives to the hospital. RN Charita made aware.  Event Summary: Name of Physician Notified: Dr. Hetty Ely  at 1257    at    Outcome: Stayed in room and stabalized     [place], [place]

## 2018-01-18 ENCOUNTER — Inpatient Hospital Stay (HOSPITAL_COMMUNITY): Payer: Medicare HMO

## 2018-01-18 ENCOUNTER — Other Ambulatory Visit: Payer: Medicare HMO

## 2018-01-18 ENCOUNTER — Ambulatory Visit (HOSPITAL_COMMUNITY): Payer: Medicare HMO

## 2018-01-18 DIAGNOSIS — J849 Interstitial pulmonary disease, unspecified: Secondary | ICD-10-CM

## 2018-01-18 DIAGNOSIS — J9601 Acute respiratory failure with hypoxia: Secondary | ICD-10-CM

## 2018-01-18 LAB — RESPIRATORY PANEL BY PCR
ADENOVIRUS-RVPPCR: NOT DETECTED
Bordetella pertussis: NOT DETECTED
CORONAVIRUS OC43-RVPPCR: NOT DETECTED
Chlamydophila pneumoniae: NOT DETECTED
Coronavirus 229E: NOT DETECTED
Coronavirus HKU1: NOT DETECTED
Coronavirus NL63: NOT DETECTED
INFLUENZA A-RVPPCR: NOT DETECTED
INFLUENZA B-RVPPCR: NOT DETECTED
MYCOPLASMA PNEUMONIAE-RVPPCR: NOT DETECTED
Metapneumovirus: NOT DETECTED
PARAINFLUENZA VIRUS 1-RVPPCR: NOT DETECTED
Parainfluenza Virus 2: NOT DETECTED
Parainfluenza Virus 3: NOT DETECTED
Parainfluenza Virus 4: NOT DETECTED
RESPIRATORY SYNCYTIAL VIRUS-RVPPCR: NOT DETECTED
RHINOVIRUS / ENTEROVIRUS - RVPPCR: NOT DETECTED

## 2018-01-18 LAB — BASIC METABOLIC PANEL
ANION GAP: 14 (ref 5–15)
BUN: 24 mg/dL — ABNORMAL HIGH (ref 8–23)
CALCIUM: 7.9 mg/dL — AB (ref 8.9–10.3)
CO2: 29 mmol/L (ref 22–32)
Chloride: 95 mmol/L — ABNORMAL LOW (ref 98–111)
Creatinine, Ser: 1.09 mg/dL (ref 0.61–1.24)
Glucose, Bld: 170 mg/dL — ABNORMAL HIGH (ref 70–99)
Potassium: 3.8 mmol/L (ref 3.5–5.1)
Sodium: 138 mmol/L (ref 135–145)

## 2018-01-18 LAB — LEGIONELLA PNEUMOPHILA SEROGP 1 UR AG: L. PNEUMOPHILA SEROGP 1 UR AG: NEGATIVE

## 2018-01-18 LAB — CBC
HCT: 34 % — ABNORMAL LOW (ref 39.0–52.0)
HEMOGLOBIN: 10.5 g/dL — AB (ref 13.0–17.0)
MCH: 23.5 pg — ABNORMAL LOW (ref 26.0–34.0)
MCHC: 30.9 g/dL (ref 30.0–36.0)
MCV: 76.1 fL — ABNORMAL LOW (ref 78.0–100.0)
Platelets: 323 10*3/uL (ref 150–400)
RBC: 4.47 MIL/uL (ref 4.22–5.81)
RDW: 23.4 % — ABNORMAL HIGH (ref 11.5–15.5)
WBC: 5.6 10*3/uL (ref 4.0–10.5)

## 2018-01-18 LAB — PHOSPHORUS: PHOSPHORUS: 5.4 mg/dL — AB (ref 2.5–4.6)

## 2018-01-18 LAB — MAGNESIUM: MAGNESIUM: 1.9 mg/dL (ref 1.7–2.4)

## 2018-01-18 LAB — GLUCOSE, CAPILLARY
Glucose-Capillary: 229 mg/dL — ABNORMAL HIGH (ref 70–99)
Glucose-Capillary: 333 mg/dL — ABNORMAL HIGH (ref 70–99)

## 2018-01-18 LAB — SEDIMENTATION RATE: Sed Rate: 61 mm/hr — ABNORMAL HIGH (ref 0–16)

## 2018-01-18 LAB — LACTIC ACID, PLASMA: LACTIC ACID, VENOUS: 1.2 mmol/L (ref 0.5–1.9)

## 2018-01-18 MED ORDER — METHYLPREDNISOLONE SODIUM SUCC 125 MG IJ SOLR
125.0000 mg | Freq: Four times a day (QID) | INTRAMUSCULAR | Status: DC
  Administered 2018-01-18 – 2018-01-19 (×3): 125 mg via INTRAVENOUS
  Filled 2018-01-18 (×3): qty 2

## 2018-01-18 MED ORDER — INSULIN ASPART 100 UNIT/ML ~~LOC~~ SOLN
0.0000 [IU] | SUBCUTANEOUS | Status: DC
  Administered 2018-01-18: 11 [IU] via SUBCUTANEOUS
  Administered 2018-01-18 – 2018-01-19 (×3): 15 [IU] via SUBCUTANEOUS
  Administered 2018-01-19: 11 [IU] via SUBCUTANEOUS

## 2018-01-18 NOTE — Progress Notes (Signed)
Progress Note  Patient Name: Justin Blackburn. Date of Encounter: 01/18/2018  Primary Cardiologist: [name], MD   Subjective    67 year old gentleman with a history of coronary artery disease, coronary artery bypass grafting and stenting of the left main in March, 2019.  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  He has paroxysmal atrial fibrillation and has been on Eliquis.  He has not missed any doses of Eliquis recently.  Patient was seen in consultation for congestive heart failure and atrial fib at the request of Dr. Beryle Beams.   Remains in normal sinus rhythm.  His net diuresis is 9.3 L so far during this admission.  Inpatient Medications    Scheduled Meds: . apixaban  5 mg Oral BID  . atorvastatin  40 mg Oral q1800  . carvedilol  3.125 mg Oral BID WC  . chlorhexidine  15 mL Mouth Rinse BID  . clopidogrel  75 mg Oral Daily  . feeding supplement (ENSURE ENLIVE)  237 mL Oral BID BM  . furosemide  80 mg Intravenous BID  . gabapentin  300 mg Oral BID  . insulin aspart  0-9 Units Subcutaneous TID WC  . mouth rinse  15 mL Mouth Rinse q12n4p  . mouth rinse  15 mL Mouth Rinse q12n4p  . methylPREDNISolone (SOLU-MEDROL) injection  125 mg Intravenous Q6H  . pantoprazole  40 mg Oral Daily  . sucralfate  1 g Oral TID WC & HS   Continuous Infusions: . azithromycin 500 mg (01/18/18 0953)  . ceFEPime (MAXIPIME) IV 1 g (01/18/18 0535)   PRN Meds: acetaminophen **OR** acetaminophen, polyethylene glycol   Vital Signs    Vitals:   01/18/18 1052 01/18/18 1100 01/18/18 1147 01/18/18 1200  BP:  111/74  131/67  Pulse:  71  69  Resp:  (!) 23  18  Temp:   97.9 F (36.6 C)   TempSrc:   Oral   SpO2: 94% 90%  94%  Weight:      Height:        Intake/Output Summary (Last 24 hours) at 01/18/2018 1253 Last data filed at 01/18/2018 1200 Gross per 24 hour  Intake 760.26 ml  Output 2075 ml  Net -1314.74 ml   Filed Weights   01/17/18  0600 01/17/18 1616 01/18/18 0500  Weight: 185 lb 10 oz (84.2 kg) 190 lb 14.7 oz (86.6 kg) 188 lb 7.9 oz (85.5 kg)    Telemetry    NSR with PACs  - Personally Reviewed  ECG     NSR  - Personally Reviewed  Physical Exam   GEN:  elderly man,  Chronically ill appearing    Neck: No JVD Cardiac: RRR, occasional premature  Beats   Respiratory: course rales - c/w fibrosis,   + E to A changes.  GI: Soft, nontender, non-distended  MS: No edema; No deformity. Neuro:  Nonfocal  Psych: Normal affect   Labs    Chemistry Recent Labs  Lab 01/14/18 0707  01/16/18 0210 01/17/18 0223 01/18/18 0337  NA 131*   < > 135 136 138  K 4.4   < > 3.8 3.4* 3.8  CL 98   < > 98 97* 95*  CO2 22   < > 28 31 29   GLUCOSE 174*   < > 126* 102* 170*  BUN 21   < > 17 17 24*  CREATININE 1.25*   < > 0.93 0.94 1.09  CALCIUM 8.2*   < >  7.7* 7.5* 7.9*  PROT 5.9*  --   --   --   --   ALBUMIN 1.8*  --   --   --   --   AST 43*  --   --   --   --   ALT 25  --   --   --   --   ALKPHOS 169*  --   --   --   --   BILITOT 0.9  --   --   --   --   GFRNONAA 57*   < > >60 >60 >60  GFRAA >60   < > >60 >60 >60  ANIONGAP 11   < > 9 8 14    < > = values in this interval not displayed.     Hematology Recent Labs  Lab 01/15/18 0326 01/16/18 0210 01/18/18 0337  WBC 10.1 10.6* 5.6  RBC 3.79* 4.03* 4.47  HGB 9.1* 9.5* 10.5*  HCT 28.3* 30.3* 34.0*  MCV 74.7* 75.2* 76.1*  MCH 24.0* 23.6* 23.5*  MCHC 32.2 31.4 30.9  RDW 23.8* 23.9* 23.4*  PLT 329 321 323    Cardiac Enzymes Recent Labs  Lab 01/14/18 0932 01/14/18 1227 01/14/18 2058 01/17/18 1616  TROPONINI 0.07* 0.10* 0.09* 0.04*    Recent Labs  Lab 01/14/18 0716  TROPIPOC 0.08     BNP Recent Labs  Lab 01/14/18 0707  BNP 781.3*     DDimer No results for input(s): DDIMER in the last 168 hours.   Radiology    Ct Chest Wo Contrast  Result Date: 01/16/2018 CLINICAL DATA:  Acute respiratory failure with hypoxemia, acute congestive heart failure,  possible healthcare associated pneumonia. Esophageal adenocarcinoma. EXAM: CT CHEST WITHOUT CONTRAST TECHNIQUE: Multidetector CT imaging of the chest was performed following the standard protocol without IV contrast. COMPARISON:  Chest radiographs dated 01/15/2018. CT chest dated 01/06/2018. FINDINGS: Cardiovascular: Cardiomegaly.  No pericardial effusion. No evidence of thoracic aortic aneurysm. Atherosclerotic calcifications of the aortic arch. Three vessel coronary atherosclerosis. Postsurgical changes related to prior CABG. Mediastinum/Nodes: Mild mediastinal lymphadenopathy, including a dominant 15 mm short axis subcarinal node and small right paratracheal nodes measuring up to 10 mm. While this appearance may be reactive given the additional findings, this is certainly nonspecific in the setting of esophageal cancer. Mild diffuse wall thickening involving the distal esophagus (series 3/image 118) with high density/calcifications at the GE junction (series 3/image 125), favoring post treatment changes. Visualized thyroid is unremarkable. Lungs/Pleura: Multifocal patchy/ground-glass opacities throughout the lungs, favoring interstitial edema. However, additional patchy lower lobe opacities with air bronchograms (series 4/image 97) raise the possibility of superimposed pneumonia. Small bilateral pleural effusions, right greater than left. No pneumothorax. Upper Abdomen: Visualized upper abdomen is grossly unremarkable. Musculoskeletal: Degenerative changes of the visualized thoracolumbar spine. Median sternotomy. IMPRESSION: Cardiomegaly with interstitial edema and small bilateral pleural effusions, right greater than left. Additional patchy bilateral lower lobe opacities, possibly reflecting superimposed pneumonia. Mild mediastinal lymphadenopathy, including a 15 mm short axis subcarinal node, indeterminate. Suspected post treatment changes involving the distal esophagus/GE junction. Aortic Atherosclerosis  (ICD10-I70.0). Electronically Signed   By: Julian Hy M.D.   On: 01/16/2018 17:48   Dg Chest Port 1 View  Result Date: 01/18/2018 CLINICAL DATA:  67 year old male respiratory failure and shortness breath. Subsequent encounter. EXAM: PORTABLE CHEST 1 VIEW COMPARISON:  [id number] chest CT.  01/15/2018 chest x-ray. FINDINGS: Post CABG.  Cardiomegaly.  Calcified aorta. Slight improved aeration right lung base. Persistent pulmonary edema and left lower lobe  consolidation which may represent superimposed left lower lobe infiltrate. No obvious pneumothorax. No acute osseous abnormality noted. IMPRESSION: 1. Slight improved aeration right lung base. 2. Persistent pulmonary edema and left lower lobe consolidation/infiltrate. 3. Cardiomegaly post CABG 4.  Aortic Atherosclerosis (ICD10-I70.0). Electronically Signed   By: Genia Del M.D.   On: 01/18/2018 07:12    Cardiac Studies     Patient Profile     67 y.o. male with history of coronary artery disease, coronary artery bypass grafting.  He has a history of esophageal adenocarcinoma treated with radiation and chemotherapy.  He has paroxysmal atrial fibrillation.  He has acute on chronic combined systolic and diastolic congestive heart failure.  Diuresed very well.  He still remains short of breath.  Assessment & Plan    1.  Acute on chronic combined systolic and diastolic congestive heart failure: He has diuresed 9.3 L so far.  He seems to be making progress.  2.  Hypoxemia: He is diuresed over 9 L.  Despite this, he still has coarse breath sounds in the lungs.  I suspect that he has some underlying pulmonary fibrosi.   this may be related to his radiation therapy.  3.  Coronary artery disease: He is not having any angina.        For questions or updates, please contact [place] Please consult [email or web address] for contact info under Cardiology/STEMI.      Signed, Mertie Moores, MD  01/18/2018, 12:53 PM

## 2018-01-18 NOTE — Progress Notes (Addendum)
Physical Therapy Treatment Patient Details Name: Justin Blackburn. MRN: [id number] DOB: 08-01-1950 Today's Date: 01/18/2018    History of Present Illness Pt is a 67 y.o. male with PMH of CAD (s/p CABG, stents), HTN, esophageal adenocarcinoma (stage T3) s/p chemo/radiation, PAD, a-flutter/a-fib, DM2, and recent LLE DVT (12/07/17), admitted 01/14/18 after fall. Worked up for CHF exacerbation and hypoxemia. Pt with worsening respiratory status on 8/5 requiring continuous BiPAP and transfer to ICU.    PT Comments    Pt slowly progressing with mobility, remains limited by respiratory status. Pt with SpO2 down to 88% on 25L HFNC (90% FiO2) while talking with therapists at beginning of session. Able to sit EOB >5 minutes with min guard, requiring modA to stand; SpO2 down to 76-82% with mobility. HFNC adjusted to 30L with SpO2 increasing to 91%. If pt's respiratory status improves, feel he may be appropriate for intensive CIR-level therapies to maximize functional mobility and return to PLOF. Will continue to follow acutely to address established goals.   Follow Up Recommendations  CIR;SNF;Supervision/Assistance - 24 hour(pending pt progression)     Equipment Recommendations  (TBD)    Recommendations for Other Services       Precautions / Restrictions Precautions Precautions: Fall Precaution Comments: watch SpO2; currently on High Flow 30L Restrictions Weight Bearing Restrictions: No    Mobility  Bed Mobility Overal bed mobility: Needs Assistance Bed Mobility: Supine to Sit;Sit to Supine     Supine to sit: Min assist Sit to supine: Min assist   General bed mobility comments: increased time and effort, min A for trunk elevation, use of bed pads to position hips at EOB  Transfers Overall transfer level: Needs assistance Equipment used: Rolling walker (2 wheeled) Transfers: Sit to/from Stand Sit to Stand: Mod assist         General transfer comment: Increased time and effort, reliant  on modA to power into standing with bilat HHA. SpO2 remained <82% on 25L HFNC sitting EOB ~5 minutes; further mobility deferred  Ambulation/Gait             General Gait Details: Deferred secondary to SpO2 maintaining 74-80% on 25L HFNC (90% FiO2), requiring RT to increase to [employer]             Wheelchair Mobility    Modified Rankin (Stroke Patients Only)       Balance Overall balance assessment: Needs assistance Sitting-balance support: Feet supported Sitting balance-Leahy Scale: Fair     Standing balance support: Bilateral upper extremity supported Standing balance-Leahy Scale: Poor Standing balance comment: Reliant on UE and external support                            Cognition Arousal/Alertness: Awake/alert Behavior During Therapy: WFL for tasks assessed/performed Overall Cognitive Status: Within Functional Limits for tasks assessed                                        Exercises General Exercises - Lower Extremity Short Arc Quad: AROM;Right;5 reps;Seated Heel Raises: AROM;Both;5 reps;Seated Other Exercises Other Exercises: encouraged BUE general AROM "ladder climbing"    General Comments General comments (skin integrity, edema, etc.): Wife present throughout session      Pertinent Vitals/Pain Pain Assessment: No/denies pain    Home Living Family/patient expects to be discharged to:: Private residence Living Arrangements: Spouse/significant other  Available Help at Discharge: Family;Available 24 hours/day Type of Home: House Home Access: Stairs to enter Entrance Stairs-Rails: Right;Left;Can reach both Home Layout: One level Home Equipment: Cane - single point Additional Comments: information gathered from chart review of pt's previous evaluation during a recent admission. 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 States after his cancer treatment in June, he has been weak and hasn't "recovered"   PT Goals (current goals can now be found in the care plan section) Acute Rehab PT Goals Patient Stated Goal: 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 PT Goal Formulation: With patient/family Time For Goal Achievement: 01/29/18 Potential to Achieve Goals: Fair Progress towards PT goals: Not progressing toward goals - comment(Limited by respiratory status)    Frequency    Min 3X/week      PT Plan Discharge plan needs to be updated;Frequency needs to be updated    Co-evaluation PT/OT/SLP Co-Evaluation/Treatment: Yes Reason for Co-Treatment: Complexity of the patient's impairments (multi-system involvement);For patient/therapist safety PT goals addressed during session: Mobility/safety with mobility;Balance OT goals addressed during session: ADL's and self-care      AM-PAC PT "6 Clicks" Daily Activity  Outcome Measure  Difficulty turning over in bed (including adjusting bedclothes, sheets and blankets)?: A Little Difficulty moving from lying on back to sitting on the side of the bed? : Unable Difficulty sitting down on and standing up from a chair with arms (e.g., wheelchair, bedside commode, etc,.)?: Unable Help needed moving to and from a bed to chair (including a wheelchair)?: A Little Help needed walking in hospital room?: A Lot Help needed climbing 3-5 steps with a railing? : Total 6 Click Score: 11    End of Session Equipment Utilized During Treatment: Gait belt;Oxygen(25-30L HFNC) Activity Tolerance: Patient limited by fatigue Patient left: in bed;with call bell/phone within reach;with bed alarm set;with family/visitor present Nurse Communication: Mobility status PT Visit Diagnosis: Other abnormalities of gait and mobility (R26.89);Muscle weakness  (generalized) (M62.81)     Time: 4401-0272 PT Time Calculation (min) (ACUTE ONLY): 30 min  Charges:  $Therapeutic Activity: 8-22 mins                     Mabeline Caras, PT, DPT Acute Rehab Services  Pager: [place] 01/18/2018, 2:20 PM

## 2018-01-18 NOTE — Progress Notes (Addendum)
Occupational Therapy Evaluation Patient Details Name: Justin Blackburn. MRN: [id number] DOB: March 30, 1951 Today's Date: 01/18/2018    History of Present Illness Pt is a 67 y/o male with PMH of CAD s/p CABG (1998) and stents (last in 07/2017 DES to left main and LCx, HTN, HLD, esophageal adenocarcinoma (stage T3) s/p chemo/radiation (carboplatin/paclitaxel), PAD, recent LLE DVT, atrial flutter/fib, T2DM presenting to ED after a fall. On EMS arrival he was noted to have oxygen sets of 66%.Pt admitted with CHF exacerbation.   Clinical Impression   PTA, pt living at home with wife and was independent with ADL and mobility with use of cane, although he was not at his baseline prior to starting his cancer treatments. Pt states "I haven't fully recovered from my treatments". Pt seen today on 25 L 90 % FiO2 blend high flow [place] and was able to tolerate sitting EOB, however O2 sats as low as 73. Completed sit - stand with +2 mod A to power up and pt returned to supine. Pt currently requires mod to Max A for ADL tasks. Respiratory adjusted high flow to 30L and O2 increased to 90s. At this time, pt appropriate for rehab at [facility]. If pt progresses regarding his respiratory status and tolerance, he may become more appropriate for rehab at CIR. Will follow acutely to address established goals and facilitate DC to next venue of care.     Follow Up Recommendations  Supervision/Assistance - 24 hour;CIR;SNF(pending progress)    Equipment Recommendations  3 in 1 bedside commode    Recommendations for Other Services       Precautions / Restrictions Precautions Precautions: Fall Precaution Comments: watch [place]; High Flow 30L Restrictions Weight Bearing Restrictions: No      Mobility Bed Mobility Overal bed mobility: Needs Assistance Bed Mobility: Supine to Sit     Supine to sit: Min assist     General bed mobility comments: increased time and effort, min A for trunk elevation, use of bed pads to position hips  at EOB  Transfers Overall transfer level: Needs assistance Equipment used: Rolling walker (2 wheeled) Transfers: Sit to/from Stand Sit to Stand: Mod assist +2    +2 A to stand. Able to stand for @ 1 min before needing to sit         Balance Overall balance assessment: Needs assistance Sitting-balance support: Feet supported Sitting balance-Leahy Scale: Fair     Standing balance support: Bilateral upper extremity supported Standing balance-Leahy Scale: Poor                             ADL either performed or assessed with clinical judgement   ADL Overall ADL's : Needs assistance/impaired Eating/Feeding: NPO Eating/Feeding Details (indicate cue type and reason): just beginnning liquids Grooming: Minimal assistance;Sitting   Upper Body Bathing: Minimal assistance;Sitting   Lower Body Bathing: Moderate assistance;Sit to/from stand   Upper Body Dressing : Moderate assistance;Sitting   Lower Body Dressing: Maximal assistance;Sit to/from stand   Toilet Transfer: +2 for physical assistance;Moderate assistance Toilet Transfer Details (indicate cue type and reason): sit - stand Toileting- Clothing Manipulation and Hygiene: Maximal assistance Toileting - Clothing Manipulation Details (indicate cue type and reason): Greater difficulty controlling BM since radiation; pt frustrated     Functional mobility during ADLs: Moderate assistance;+2 for physical assistance(sit - stand only)       Vision Baseline Vision/History: Wears glasses       Perception  Praxis      Pertinent Vitals/Pain Pain Assessment: No/denies pain     Hand Dominance Right   Extremity/Trunk Assessment Upper Extremity Assessment Upper Extremity Assessment: Generalized weakness(B hand "tingling")   Lower Extremity Assessment Lower Extremity Assessment: Defer to PT evaluation(B foot numbness for "years")   Cervical / Trunk Assessment Cervical / Trunk Assessment: Kyphotic(forward  head)   Communication Communication Communication: No difficulties   Cognition Arousal/Alertness: Awake/alert Behavior During Therapy: WFL for tasks assessed/performed Overall Cognitive Status: Within Functional Limits for tasks assessed                                     General Comments       Exercises Exercises: Other exercises Other Exercises Other Exercises: encouraged BUE general AROM "ladder climbing"   Shoulder Instructions      Home Living Family/patient expects to be discharged to:: Private residence Living Arrangements: Spouse/significant other Available Help at Discharge: Family;Available 24 hours/day Type of Home: House Home Access: Stairs to enter [employer] of Steps: 2 Entrance Stairs-Rails: Right;Left;Can reach both Home Layout: One level     Bathroom Shower/Tub: Tub/shower unit;Walk-in shower   Bathroom Toilet: Handicapped height     Home Equipment: [place] - single point   Additional Comments: Wife staes they can arrange 24/7 A t DC. 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 States after his cancer treatment in June, he has been weak and hasn't "recovered"        OT Problem List: Decreased strength;Decreased activity tolerance;Impaired balance (sitting and/or standing);Decreased safety awareness;Decreased knowledge of use of DME or AE;Cardiopulmonary status limiting activity      OT Treatment/Interventions: Self-care/ADL training;Therapeutic exercise;Energy conservation;DME and/or AE instruction;Therapeutic activities;Patient/family education;Balance training    OT Goals(Current goals can be found in the care plan section) Acute Rehab OT Goals Patient Stated Goal: 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 OT Goal Formulation: With patient/family Time For Goal Achievement:  02/01/18 Potential to Achieve Goals: Good  OT Frequency: Min 2X/week   Barriers to D/C:            Co-evaluation PT/OT/SLP Co-Evaluation/Treatment: Yes Reason for Co-Treatment: Complexity of the patient's impairments (multi-system involvement);For patient/therapist safety   OT goals addressed during session: ADL's and self-care      AM-PAC PT "6 Clicks" Daily Activity     Outcome Measure Help from another person eating meals?: Total(NPO;liquids started today) Help from another person taking care of personal grooming?: A Lot Help from another person toileting, which includes using toliet, bedpan, or urinal?: A Lot Help from another person bathing (including washing, rinsing, drying)?: A Lot Help from another person to put on and taking off regular upper body clothing?: A Lot Help from another person to put on and taking off regular lower body clothing?: A Lot 6 Click Score: 11   End of Session Equipment Utilized During Treatment: Gait belt;Oxygen(25 L) Nurse Communication: Mobility status  Activity Tolerance: Patient limited by fatigue(desat to 73 on 25L) Patient left: in bed;with call bell/phone within reach;with family/visitor present(chair position)  OT Visit Diagnosis: Unsteadiness on feet (R26.81);Muscle weakness (generalized) (M62.81)                Time: 2703-5009 OT Time Calculation (min): 28 min Charges:  OT General Charges $OT Visit: 1 Visit OT Evaluation $OT Eval Moderate Complexity: [place], OT/L  OT Clinical Specialist [phone]   [facility] 01/18/2018, 11:47 AM

## 2018-01-18 NOTE — Progress Notes (Addendum)
Medicine attending: We greatly appreciate assistance from pulmonary critical care and cardiology. Dramatic improvement with the addition of parenteral steroids although he remains severely hypoxic. Amiodarone discontinued as potential precipitating factor. He continues on cefipime & azithromycin.  Please note I discontinued the vancomycin yesterday since I think that there is very low likelihood that he has MRSA and more likely that he has another opportunistic infection. Legionella urinary antigen is negative.  We might be able to stop the azithromycin at this point and just continue the cefapime. Respiratory virus panel negative.

## 2018-01-18 NOTE — Progress Notes (Signed)
RT increased pt FIO2 to 85% to maintain sat of 90 or greater. RT will continue to monitor and titrate pt FIO2 as needed to maintain sats.

## 2018-01-18 NOTE — Care Management Note (Signed)
Case Management Note  Patient Details  Name: Justin Blackburn. MRN: [id number] Date of Birth: [birth year]  Subjective/Objective:   From home presents with  Acute hypoxic resp failiure, chf, L DVT, , Afib, hypokalemia,  Suspect pna.               Action/Plan: NCM will follow for transition of care needs.   Expected Discharge Date:  01/18/18               Expected Discharge Plan:     In-House Referral:     Discharge planning Services  CM Consult  Post Acute Care Choice:    Choice offered to:     DME Arranged:    DME Agency:     HH Arranged:    HH Agency:     Status of Service:  In process, will continue to follow  If discussed at Long Length of Stay Meetings, dates discussed:    Additional Comments:  [name], RN 01/18/2018, 3:41 PM

## 2018-01-18 NOTE — Progress Notes (Addendum)
 Name: Justin F Buhrman Jr. MRN: [id number] DOB: 04/14/1951    ADMISSION DATE:  01/14/2018 CONSULTATION DATE:  8/4  REFERRING MD :  [facility] /Dr. Kilma   CHIEF COMPLAINT:  Hypoxia   BRIEF PATIENT DESCRIPTION: 67-year-old male with minimal smoking history admitted August 2 with decompensated congestive heart failure.  Patient has esophageal cancer status post recent chemo and XRT.  Patient with progressive hypoxemia despite diuresis.  Pulmonary consulted August 4  SIGNIFICANT EVENTS    STUDIES:  2D echo 8/2> EF 45 to 50%, moderate dilation of right ventricle, PAP 44  Prior to admit--- Venous Doppler December 07, 2017 acute DVT left leg common femoral vein, femoral, popliteal, posterior tibial, peroneal, left external iliac.  PET scan 4/4> mild to moderate hyper metabolic and distal esophagus.  No findings for pulmonary or hepatic metastatic disease.  No adenopathy.  VQ scan 6/25 normal, no evidence of PE  CT chest with contrast 7/25 no evidence for metastatic adenopathy.  Small bilateral pleural effusions with interstitial and pulmonary edema.  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  Patient has a history of coronary artery disease status post CABG in 1998 and subsequent redo in 2007.  Stents placed in 2013.  And March 2019.  Plavix started in March 2019.  Since radiation patient has significant difficulty eating with weight loss.  As of recent says that that has improved some.  He has no nausea or vomiting. Was diagnosed with a extensive DVT on the left leg December 07, 2017 started on Eliquis.  During this timeframe he started having increased shortness of breath.  A VQ scan was done that was normal with no evidence of PE.  Patient had a follow-up CT chest January 06, 2018 that showed no evidence of metastatic adenopathy.  There was  small bilateral pleural effusions with interstitial and pulmonary edema that appeared to be somewhat new.  Along with radiation effects of the along the distal esophagus.  Patient says the night before current admission that he slipped off the bed with no apparent perceived injury.  However the next morning patient became more more short of breath.  He was transferred via EMS from home to the emergency room.  Felt to have decompensated congestive heart failure.  Previous echo showed EF 45 to 50%.  Echo was repeated with no significant change this admission patient has been diuresed since admission with a -5 L removed. White count is slightly elevated.  And he has developed some low-grade temps.  Lactic acid 1.7 on admission.  Chest x-ray yesterday showed bilateral opacities concerning for edema.  Oxygen demands have increased progressively and now patient is requiring a nonrebreather to keep O2 saturations at 90%.  On arrival patient is in the bed alert and oriented follows commands.  And answers questions appropriately.  O2 saturations are 89 to 90% on 100% nonrebreather mask.  Wife is at bedside been updated  SUBJECTIVE:  Off BiPAP HFNC weaned down to 80%, speaking in full sentences   VITAL SIGNS: Temp:  [97.7 F (36.5 C)-98.5 F (36.9 C)] 97.7 F (36.5 C) (08/06 0756) Pulse Rate:  [69-99] 76 (08/06 1000) Resp:  [10-35] 15 (08/06 1000) BP: (103-142)/(65-90) 122/78 (08/06 1000) SpO2:  [87 %-100 %] 94 % (08/06 1052) FiO2 (%):  [60 %-100 %] 90 % (08/06 1052) Weight:  [85.5 kg (188   lb 7.9 oz)-86.6 kg (190 lb 14.7 oz)] 85.5 kg (188 lb 7.9 oz) (08/06 0500)  PHYSICAL EXAMINATION: General: chronically ill appearing on HFNC Neuro: Alert and interactive, moving all ext to command HEENT: [place]/AT, PERRL, EOM-I and MMM Cardiovascular: IRIR, Nl S1/S2 and -M/R/G Lungs: Diffuse crackles Abdomen: Soft, NT, ND and +BS Musculoskeletal: intact  Skin: no rash, -edema and -tenderness  Recent Labs  Lab  01/16/18 0210 01/17/18 0223 01/18/18 0337  NA 135 136 138  K 3.8 3.4* 3.8  CL 98 97* 95*  CO2 _0 BUN 17 17 24*  CREATININE 0.93 0.94 1.09  GLUCOSE 126* 102* 170*   Recent Labs  Lab 01/15/18 0326 01/16/18 0210 01/18/18 0337  HGB 9.1* 9.5* 10.5*  HCT 28.3* 30.3* 34.0*  WBC 10.1 10.6* 5.6  PLT 329 321 323   Ct Chest Wo Contrast  Result Date: 01/16/2018 CLINICAL DATA:  Acute respiratory failure with hypoxemia, acute congestive heart failure, possible healthcare associated pneumonia. Esophageal adenocarcinoma. EXAM: CT CHEST WITHOUT CONTRAST TECHNIQUE: Multidetector CT imaging of the chest was performed following the standard protocol without IV contrast. COMPARISON:  Chest radiographs dated 01/15/2018. CT chest dated 01/06/2018. FINDINGS: Cardiovascular: Cardiomegaly.  No pericardial effusion. No evidence of thoracic aortic aneurysm. Atherosclerotic calcifications of the aortic arch. Three vessel coronary atherosclerosis. Postsurgical changes related to prior CABG. Mediastinum/Nodes: Mild mediastinal lymphadenopathy, including a dominant 15 mm short axis subcarinal node and small right paratracheal nodes measuring up to 10 mm. While this appearance may be reactive given the additional findings, this is certainly nonspecific in the setting of esophageal cancer. Mild diffuse wall thickening involving the distal esophagus (series 3/image 118) with high density/calcifications at the GE junction (series 3/image 125), favoring post treatment changes. Visualized thyroid is unremarkable. Lungs/Pleura: Multifocal patchy/ground-glass opacities throughout the lungs, favoring interstitial edema. However, additional patchy lower lobe opacities with air bronchograms (series 4/image 97) raise the possibility of superimposed pneumonia. Small bilateral pleural effusions, right greater than left. No pneumothorax. Upper Abdomen: Visualized upper abdomen is grossly unremarkable. Musculoskeletal: Degenerative  changes of the visualized thoracolumbar spine. Median sternotomy. IMPRESSION: Cardiomegaly with interstitial edema and small bilateral pleural effusions, right greater than left. Additional patchy bilateral lower lobe opacities, possibly reflecting superimposed pneumonia. Mild mediastinal lymphadenopathy, including a 15 mm short axis subcarinal node, indeterminate. Suspected post treatment changes involving the distal esophagus/GE junction. Aortic Atherosclerosis (ICD10-I70.0). Electronically Signed   By: Julian Hy M.D.   On: 01/16/2018 17:48   Dg Chest Port 1 View  Result Date: 01/18/2018 CLINICAL DATA:  68 year old male respiratory failure and shortness breath. Subsequent encounter. EXAM: PORTABLE CHEST 1 VIEW COMPARISON:  [id number] chest CT.  01/15/2018 chest x-ray. FINDINGS: Post CABG.  Cardiomegaly.  Calcified aorta. Slight improved aeration right lung base. Persistent pulmonary edema and left lower lobe consolidation which may represent superimposed left lower lobe infiltrate. No obvious pneumothorax. No acute osseous abnormality noted. IMPRESSION: 1. Slight improved aeration right lung base. 2. Persistent pulmonary edema and left lower lobe consolidation/infiltrate. 3. Cardiomegaly post CABG 4.  Aortic Atherosclerosis (ICD10-I70.0). Electronically Signed   By: Genia Del M.D.   On: 01/18/2018 07:12   I reviewed CXR myself, pulmonary infiltrate noted.  ASSESSMENT / PLAN: 1. Acute Hypoxic respiratory failure-in the setting of decompensated congestive heart failure.  Patient is immunosuppressed with low-grade temp and mildly elevated white blood cell count.  Suspect has secondary PNA  Doubt PE as recent VQ and CT w/ contrast neg . Compliant w/ Eliquis .  Concern with  recent chemo , unlikely a  pneumonitis or aspiration as finished chemo several weeks ago and swallowing was improving .   8/5 >LA 2.61>1.7>2.0 , PCT 0.21  CT chest 8/4 >sm B effusion R>>L , interstitial edema , patchy BB  opacities w/ air bronchograms  Episode of collapse and severe hypoxia - improved with BIPAP   Plan  Continue cefepime and vanc, started 8/4 Azithr added 8/5, will continue for a total of 5 days Follow CX data  Continue diureses as ordered Pan culture . Leigonella  ag negative Increase solumedrol to 125 mg IV q6 x3 days since procal is reassuring Check RVP ordered 8/6 Auto-immune work ordered 8/6  2. DVT Left   Plan  Cont Eliquis   3. CHF  8/5 >neg 7 L bal , wt down 16lb since admit  Plan  Cont diuresis   4. A Fib  On Amiodarone -check ESR  ? amio toxicity .   Plan  Trial of steroids -Solumedrol 40mg q6  D/C amiodarone  4. Hypokalemia  Hypomagesium   Plan  Replace electrolytes as indicated  Discussed with PCCM-NP.  Discussed code status at length and explained need for high dose steroids and concern for respiratory failure.  Full code for now, short term intubation only.  The patient is critically ill with multiple organ systems failure and requires high complexity decision making for assessment and support, frequent evaluation and titration of therapies, application of advanced monitoring technologies and extensive interpretation of multiple databases.   Critical Care Time devoted to patient care services described in this note is  32  Minutes. This time reflects time of care of this signee Dr Wesam Yacoub. This critical care time does not reflect procedure time, or teaching time or supervisory time of PA/NP/Med student/Med Resident etc but could involve care discussion time.  Wesam G. Yacoub, M.D. [facility]. Pager: [phone]. After hours pager: [phone].  01/18/2018, 11:02 AM   

## 2018-01-18 NOTE — Progress Notes (Signed)
Notified by RN of increasing blood sugar with high dose steroids.   SSI adjusted from sensitive to moderate scale.   Kennieth Rad, AGACNP-BC [facility] Pgr: [phone] or if no answer [phone] 01/18/2018, 5:07 PM

## 2018-01-19 DIAGNOSIS — Z9981 Dependence on supplemental oxygen: Secondary | ICD-10-CM

## 2018-01-19 DIAGNOSIS — C159 Malignant neoplasm of esophagus, unspecified: Secondary | ICD-10-CM

## 2018-01-19 DIAGNOSIS — I25810 Atherosclerosis of coronary artery bypass graft(s) without angina pectoris: Secondary | ICD-10-CM

## 2018-01-19 DIAGNOSIS — R739 Hyperglycemia, unspecified: Secondary | ICD-10-CM

## 2018-01-19 DIAGNOSIS — E876 Hypokalemia: Secondary | ICD-10-CM

## 2018-01-19 DIAGNOSIS — R0902 Hypoxemia: Secondary | ICD-10-CM

## 2018-01-19 DIAGNOSIS — T380X5A Adverse effect of glucocorticoids and synthetic analogues, initial encounter: Secondary | ICD-10-CM

## 2018-01-19 DIAGNOSIS — I214 Non-ST elevation (NSTEMI) myocardial infarction: Secondary | ICD-10-CM

## 2018-01-19 LAB — BASIC METABOLIC PANEL
Anion gap: 11 (ref 5–15)
BUN: 35 mg/dL — ABNORMAL HIGH (ref 8–23)
CALCIUM: 7.6 mg/dL — AB (ref 8.9–10.3)
CO2: 29 mmol/L (ref 22–32)
CREATININE: 1.24 mg/dL (ref 0.61–1.24)
Chloride: 93 mmol/L — ABNORMAL LOW (ref 98–111)
GFR calc Af Amer: >60 mL/min (ref >60)
GFR calc non Af Amer: 58 mL/min — ABNORMAL LOW (ref >60)
Glucose, Bld: 455 mg/dL — ABNORMAL HIGH (ref 70–99)
Potassium: 3.2 mmol/L — ABNORMAL LOW (ref 3.5–5.1)
Sodium: 133 mmol/L — ABNORMAL LOW (ref 135–145)

## 2018-01-19 LAB — GLUCOSE, CAPILLARY
Glucose-Capillary: 213 mg/dL — ABNORMAL HIGH (ref 70–99)
Glucose-Capillary: 267 mg/dL — ABNORMAL HIGH (ref 70–99)
Glucose-Capillary: 325 mg/dL — ABNORMAL HIGH (ref 70–99)
Glucose-Capillary: 338 mg/dL — ABNORMAL HIGH (ref 70–99)
Glucose-Capillary: 343 mg/dL — ABNORMAL HIGH (ref 70–99)
Glucose-Capillary: 390 mg/dL — ABNORMAL HIGH (ref 70–99)
Glucose-Capillary: 449 mg/dL — ABNORMAL HIGH (ref 70–99)

## 2018-01-19 LAB — CBC
HEMATOCRIT: 30.6 % — AB (ref 39.0–52.0)
Hemoglobin: 9.3 g/dL — ABNORMAL LOW (ref 13.0–17.0)
MCH: 23.7 pg — ABNORMAL LOW (ref 26.0–34.0)
MCHC: 30.4 g/dL (ref 30.0–36.0)
MCV: 78.1 fL (ref 78.0–100.0)
Platelets: 333 10*3/uL (ref 150–400)
RBC: 3.92 MIL/uL — ABNORMAL LOW (ref 4.22–5.81)
RDW: 23.2 % — AB (ref 11.5–15.5)
WBC: 8.3 10*3/uL (ref 4.0–10.5)

## 2018-01-19 LAB — PHOSPHORUS: PHOSPHORUS: 3.4 mg/dL (ref 2.5–4.6)

## 2018-01-19 LAB — TSH: TSH: 0.111 u[IU]/mL — AB (ref 0.350–4.500)

## 2018-01-19 LAB — T4, FREE: Free T4: 1.41 ng/dL (ref 0.82–1.77)

## 2018-01-19 LAB — HIGH SENSITIVITY CRP: CRP, High Sensitivity: 140.41 mg/L — ABNORMAL HIGH (ref 0.00–3.00)

## 2018-01-19 LAB — ANA: ANA: NEGATIVE

## 2018-01-19 LAB — MAGNESIUM: Magnesium: 1.8 mg/dL (ref 1.7–2.4)

## 2018-01-19 MED ORDER — ADULT MULTIVITAMIN W/MINERALS CH
1.0000 | ORAL_TABLET | Freq: Every day | ORAL | Status: DC
  Administered 2018-01-19 – 2018-01-27 (×9): 1 via ORAL
  Filled 2018-01-19 (×9): qty 1

## 2018-01-19 MED ORDER — METHYLPREDNISOLONE SODIUM SUCC 125 MG IJ SOLR
125.0000 mg | Freq: Four times a day (QID) | INTRAMUSCULAR | Status: DC
  Administered 2018-01-19 – 2018-01-21 (×9): 125 mg via INTRAVENOUS
  Filled 2018-01-19 (×9): qty 2

## 2018-01-19 MED ORDER — POTASSIUM CHLORIDE CRYS ER 20 MEQ PO TBCR
40.0000 meq | EXTENDED_RELEASE_TABLET | Freq: Once | ORAL | Status: AC
  Administered 2018-01-19: 40 meq via ORAL
  Filled 2018-01-19: qty 2

## 2018-01-19 MED ORDER — INSULIN GLARGINE 100 UNIT/ML ~~LOC~~ SOLN
10.0000 [IU] | Freq: Every day | SUBCUTANEOUS | Status: DC
  Administered 2018-01-20 – 2018-01-21 (×2): 10 [IU] via SUBCUTANEOUS
  Filled 2018-01-19 (×2): qty 0.1

## 2018-01-19 MED ORDER — SODIUM CHLORIDE 0.9 % IV SOLN
500.0000 mg | INTRAVENOUS | Status: AC
  Administered 2018-01-19 – 2018-01-21 (×3): 500 mg via INTRAVENOUS
  Filled 2018-01-19 (×3): qty 500

## 2018-01-19 MED ORDER — INSULIN GLARGINE 100 UNIT/ML ~~LOC~~ SOLN
42.0000 [IU] | Freq: Every day | SUBCUTANEOUS | Status: DC
  Administered 2018-01-19: 42 [IU] via SUBCUTANEOUS
  Filled 2018-01-19: qty 0.42

## 2018-01-19 MED ORDER — FUROSEMIDE 40 MG PO TABS
40.0000 mg | ORAL_TABLET | Freq: Two times a day (BID) | ORAL | Status: DC
  Administered 2018-01-20 – 2018-01-27 (×16): 40 mg via ORAL
  Filled 2018-01-19 (×16): qty 1

## 2018-01-19 MED ORDER — INSULIN ASPART 100 UNIT/ML ~~LOC~~ SOLN
0.0000 [IU] | Freq: Three times a day (TID) | SUBCUTANEOUS | Status: DC
  Administered 2018-01-19: 11 [IU] via SUBCUTANEOUS
  Administered 2018-01-19: 15 [IU] via SUBCUTANEOUS
  Administered 2018-01-20: 20 [IU] via SUBCUTANEOUS
  Administered 2018-01-20: 7 [IU] via SUBCUTANEOUS
  Administered 2018-01-20: 20 [IU] via SUBCUTANEOUS
  Administered 2018-01-21: 7 [IU] via SUBCUTANEOUS
  Administered 2018-01-21: 4 [IU] via SUBCUTANEOUS
  Administered 2018-01-21: 7 [IU] via SUBCUTANEOUS
  Administered 2018-01-22: 4 [IU] via SUBCUTANEOUS
  Administered 2018-01-22 (×2): 7 [IU] via SUBCUTANEOUS
  Administered 2018-01-23: 3 [IU] via SUBCUTANEOUS
  Administered 2018-01-23 (×2): 4 [IU] via SUBCUTANEOUS
  Administered 2018-01-24: 3 [IU] via SUBCUTANEOUS
  Administered 2018-01-24: 7 [IU] via SUBCUTANEOUS
  Administered 2018-01-24: 4 [IU] via SUBCUTANEOUS
  Administered 2018-01-25: 3 [IU] via SUBCUTANEOUS
  Administered 2018-01-25 (×2): 11 [IU] via SUBCUTANEOUS
  Administered 2018-01-26 (×2): 7 [IU] via SUBCUTANEOUS
  Administered 2018-01-27: 11 [IU] via SUBCUTANEOUS

## 2018-01-19 MED ORDER — METHYLPREDNISOLONE SODIUM SUCC 125 MG IJ SOLR: 125.0000 mg | Freq: Two times a day (BID) | INTRAMUSCULAR | Status: DC

## 2018-01-19 MED ORDER — MAGNESIUM SULFATE 2 GM/50ML IV SOLN
2.0000 g | Freq: Once | INTRAVENOUS | Status: AC
  Administered 2018-01-19: 2 g via INTRAVENOUS
  Filled 2018-01-19: qty 50

## 2018-01-19 MED ORDER — BENZONATATE 100 MG PO CAPS
200.0000 mg | ORAL_CAPSULE | Freq: Three times a day (TID) | ORAL | Status: DC
  Administered 2018-01-19 – 2018-01-27 (×24): 200 mg via ORAL
  Filled 2018-01-19 (×24): qty 2

## 2018-01-19 MED ORDER — INSULIN GLARGINE 100 UNIT/ML ~~LOC~~ SOLN
10.0000 [IU] | Freq: Every day | SUBCUTANEOUS | Status: DC
  Filled 2018-01-19: qty 0.1

## 2018-01-19 NOTE — Progress Notes (Signed)
Inpatient Diabetes Program Recommendations  AACE/ADA: New Consensus Statement on Inpatient Glycemic Control (2015)  Target Ranges:  Prepandial:   less than 140 mg/dL      Peak postprandial:   less than 180 mg/dL (1-2 hours)      Critically ill patients:  140 - 180 mg/dL   Results for LAWARENCE, MEEK (MRN [id number]) as of 01/19/2018 09:49  Ref. Range 01/18/2018 12:06 01/18/2018 16:22  Glucose-Capillary Latest Ref Range: 70 - 99 mg/dL 229 (H)  3 units NOVOLOG  333 (H)  11 units NOVOLOG    Results for BENJIMAN, SEDGWICK (MRN [id number]) as of 01/19/2018 09:49  Ref. Range 01/19/2018 00:50 01/19/2018 04:54 01/19/2018 08:11  Glucose-Capillary Latest Ref Range: 70 - 99 mg/dL 449 (H)  15 units NOVOLOG  390 (H)  15 units NOVOLOG  325 (H)  11 units NOVOLOG     Admit with: CHF Exacerbation  History: DM, Esophageal Cancer  Home DM Meds: Tresiba (Insulin degludec) 85 units daily  Current Orders: Novolog Moderate Correction Scale/ SSI (0-15 units) Q4 hours        Patient currently receiving Solumedrol 125 mg Q6 hours.  Glucose >300 mg/dl.      MD- Please consider the following in-hospital insulin adjustments:  1. Increase Novolog Correction scale (SSI) to Resistant scale (0-20 units)  2. Start Lantus 42 units daily (50% total home dose of basal insulin)--Per records, pt is supposedly taking 85 units Tresiba Insulin daily at home      --Will follow patient during hospitalization--  Wyn Quaker RN, MSN, CDE Diabetes Coordinator Inpatient Glycemic Control Team Team Pager: [phone] (8a-5p)

## 2018-01-19 NOTE — Progress Notes (Signed)
Nutrition Follow-up  DOCUMENTATION CODES:   Non-severe (moderate) malnutrition in context of chronic illness  INTERVENTION:   -D/c Ensure Enlive po BID, each supplement provides 350 kcal and 20 grams of protein, due to poor acceptance -MVI with minerals daily -Continue Magic Cup BID with meals, each supplement provides 290 kcals and 9 grams protein  NUTRITION DIAGNOSIS:   Moderate Malnutrition related to chronic illness, catabolic illness, cancer and cancer related treatments as evidenced by mild fat depletion, mild muscle depletion.  Ongoing  GOAL:   Patient will meet greater than or equal to 90% of their needs  Progressing  MONITOR:   PO intake, Supplement acceptance, Weight trends, Labs  REASON FOR ASSESSMENT:   Malnutrition Screening Tool    ASSESSMENT:   67 year old man with advanced CAD s/p MI, s/p bypass surgery x2, s/p recent angioplasty and drug eluting stent placement in February 2019.  Severe multivessel disease with total occlusion of the RCA and LAD, severe stenosis. 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  Recent completed a course of chemotherapy and radiation therapy in June.  History of atrial fibrillation/flutter diagnosed in March 2019. Patient reports 40 lb weight loss since cancer diagnosis. Patient presented to the ED after he slid off of his couch while trying to use a urinal and could not get up off the floor. Ambulance was called and the patient was transported to the hospital. He was found to be severely hypoxic in the ED.  8/5- transferred to ICU due to respiratory distress 8/6- transferred to progressive care unit  Reviewed I/O's: -970 ml x 24 hours and -9.8 L since admission  Pt receiving nursing care at time of visit.   Pt on HFNC. Appetite has improved, but still variable (PO: 15-100%). Ensure supplements were ordered by MD, but pt has been refusing related to poor  acceptance.  Per MD notes, plan for CIR vs SNF evaluation. Will taper down steroids tomorrow.   Last Hgb A1c: 9.6 (01/18/15) PTA DM medications are 85 units insulin degludec daily.   Labs reviewed: Na: 133, K: 3.2 (on IV supplementation), Phos WDL, CBGS: 325-449(inpatient orders for glycemic control are 0-15 units insulin aspart every 4 hours).   Diet Order:   Diet Order           Diet heart healthy/carb modified Room service appropriate? Yes; Fluid consistency: Thin  Diet effective now          EDUCATION NEEDS:   No education needs have been identified at this time  Skin:  Skin Assessment: Skin Integrity Issues: Skin Integrity Issues:: Other (Comment) Stage I: sacrum Other: lt knee abrasion  Last BM:  01/17/18  Height:   Ht Readings from Last 1 Encounters:  01/17/18 5\' 11"  (1.803 m)    Weight:   Wt Readings from Last 1 Encounters:  01/19/18 189 lb 13.1 oz (86.1 kg)    Ideal Body Weight:  80.91 kg  BMI:  Body mass index is 26.47 kg/m.  Estimated Nutritional Needs:   Kcal:  1610-9604 (26-28 kcal/kg)  Protein:  120-130 grams  Fluid:  >/= 2 L/day    Tetsuo Coppola A. Jimmye Norman, RD, LDN, CDE Pager: [phone] After hours Pager: [phone]

## 2018-01-19 NOTE — Progress Notes (Addendum)
Subjective: Mr. Kluender feels and look much better this morning. He is sitting up in bed with wife at bedside. We stopped the amiodarone and started high dose steroids. He is still requiring high amounts of oxygen to maintain good saturations. We discussed taper down his steroids tomorrow or the next day depending on pulmonology's recommendations and also cutting back on his lasix since he appears euvolemic now and has had a great amount of urine output. He seems to be in better spirits today and is encouraged with his progress.   Objective:  Vital signs in last 24 hours: Vitals:   01/19/18 0500 01/19/18 0600 01/19/18 0729 01/19/18 0736  BP:    106/65  Pulse: 63 64 66 62  Resp: 18 19 20  (!) 21  Temp:    97.6 F (36.4 C)  TempSrc:    Oral  SpO2: 94% 92% 93% 93%  Weight:      Height:       Gen: Frail appearing, NAD, laying in bed on nonrebreather  ENT: OP with small amount of exudate.  CV: RRR, no murmurs, no JVD Pulm: Increased effort, fine, dry bibasilar crackles, no crackles in superior portions of posterior lung fields, no wheezing Abd: Soft, NT, ND, normal BS.  Ext: Warm, Left lower extremity with 1-2+ pitting edema to the calf. Right lower extremity edema has resolved.  8/4 Chest CT with interstitial edema, small bilateral pleural effusions, and additional patchy/ground glass bilateral lower lobe opacities with air bronchograms concerning for superimposed pneumonia.   Assessment/Plan:  Mr. Pichon is a 67 yo male with history of chronic combined CHF secondary to ischemic cardiomyopathy with DES to the LAD and LCx in Feb 2019 and s/p CABG x2, esophogeal adenocarcinoma s/p chemoradiation, afib/flutter who presented with hypoxemia to 66% on RA and was admitted for volume overload and hypoxic respiratory failure.   #Acute hypoxic respiratory failure: Appears very comfortable but continues to require high amounts of oxygen via HFNC. RT had to increase the FiO2 to 85% yesterday evening in  order to maintain O2 sats > 90%. Settings this morning were 25L at 85% FiO2 with an O2 saturation of 91% during my exam. His net urine output is 9.3L. Weight has been stable over the last 3 days and creatine and BUN initially improved but began rising today. On exam, crackles have improved and are now only present in the bilateral bases. Lower extremity edema has resolved in the right leg and left leg edema has improved but is still present. Differential includes amiodarone pulmonary toxicity vs. Radiation pneumonitis or radiation induced scaring. I would think his radiation treatment was more focalized on the esophagus and doubt it would cause such diffuse pulmonary scaring. 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 It's peak onset is 1-3 months after completion of radiation and Mr. Davitt completed radiation at the end of June 2019. Amiodarone pulmonary toxicity is also possible. Even though he was only on Amiodarone for 1 month, it can occur even within 2 weeks of starting Amiodarone.  - wean O2 as tolerated - Decrease lasix to 40mg  po BID starting tomorrow  - Continue Azithromycin (8/5 - 8/9) for a 5 day course - Continue Cefepime (8/4 - ?)  - Continue Solu-Medrol 125mg  IV q6h for one more day, making a total of 3 days. Then begin to taper if pulmonology agrees.  - Continue holding amiodarone. Respiratory status seemed to improve with discontinuation of amiodarone and initiation of high dose steroids. Although, he was only taking Amiodarone since  December 07, 2017 and cardiology does not feel this is long enough to cause pulmonary toxicity. Regardless, steroids are the treatment for radiation pneumonitis and amiodarone toxicity.  - BCx no growth at 2 days - If fevers, add back azithromycin and vancomycin and redraw blood cultures  #Acute on chronic combined systolic and diastolic congestive heart failure secondary to ischemic cardiomyopathy   Acute pulmonary edema ([place]): Diuresis has stabilized. Weight is  unchanged the last 3 days, creatine and BUN are beginning to rise. Total net output is 9.3L. - Discontinue IV lasix - START po lasix 40mg  BID - Replete K+ and Mg+ as needed - continue home coreg, plavix, and statin    - not on ACE/ ARB due to orthostatic hypotension in the past, we should resume this at a very low dose when he is stable  - cardiology is on board, we appreciate their assistance in the care of this patient.   #Type 2 NSTEMI - troponin peaked at 0.10  - type 2 and secondary to demand ischemia from volume overload  - remains chest pain free   #Atrial fibrillation, paroxysmal (HCC) - Converted to NSR with PACs  - Discontinued amiodarone due to possibility of pulmonary toxicity - Continue carvedilol 3.125 twice daily for rate control -Continue Eliquis for stroke prophylaxis and DVT as noted below -Continue electrolyte and telemetry monitoring  #Microcytic anemia  - ferritin elevated, but unreliable as it is an acute phase reactant  - continue daily iron supplementation - monitor CBC   #History of DVT (deep vein thrombosis) - continue eliquis   #Diabetes mellitus type 2, insulin dependent ([place]) - Blood glucose 229-449 over the past 24 hours. Increased with IV steroids - Continue blood glucose monitoring and SSI - Increased Lantus from 10 units to 42 units daily per diabetes coordinator recommendation - Novolog 0-20 units with meals  #Pressure injury of skin  Malnutrition of moderate degree -Dietitian following, we appreciate their assistance in the care of this patient  Dispo: Anticipated discharge in approximately 3-5 day(s).   [name], MD 01/19/2018, 9:48 AM Pager: [phone]

## 2018-01-19 NOTE — Progress Notes (Signed)
RT performed a protocol assessment on this pt. Pt score was 12 which would indicate the need for a treatment of some kind. This RT does not believe this pt requires any treatments. RT will continue to monitor.

## 2018-01-19 NOTE — Plan of Care (Signed)
  Problem: Education: Goal: Knowledge of General Education information will improve Description Including pain rating scale, medication(s)/side effects and non-pharmacologic comfort measures Outcome: Progressing   Problem: Coping: Goal: Level of anxiety will decrease Outcome: Progressing   Problem: Pain Managment: Goal: General experience of comfort will improve Outcome: Progressing   Problem: Safety: Goal: Ability to remain free from injury will improve Outcome: Progressing   Problem: Skin Integrity: Goal: Risk for impaired skin integrity will decrease Outcome: Progressing   Problem: Health Behavior/Discharge Planning: Goal: Ability to manage health-related needs will improve Outcome: Not Progressing   Problem: Clinical Measurements: Goal: Ability to maintain clinical measurements within normal limits will improve Outcome: Not Progressing Goal: Respiratory complications will improve Outcome: Not Progressing   Problem: Activity: Goal: Risk for activity intolerance will decrease Outcome: Not Progressing   Problem: Nutrition: Goal: Adequate nutrition will be maintained Outcome: Not Progressing

## 2018-01-19 NOTE — Plan of Care (Signed)
  Problem: Education: Goal: Knowledge of General Education information will improve Description: Including pain rating scale, medication(s)/side effects and non-pharmacologic comfort measures Outcome: Progressing   Problem: Coping: Goal: Level of anxiety will decrease Outcome: Progressing   

## 2018-01-19 NOTE — Progress Notes (Signed)
Medicine attending: I examined this patient today together with resident physician Dr Francesco Runner and I concur with her evaluation and management plan which we discussed together. Symptomatically improved but he remains hypoxic. We agree with cardiology he has probably reached maximal diuresis now with slight rise in BUN and creatinine and we will decrease his diuretic dose. Not unexpectedly his glucose has elevated in view of high-dose parenteral steroids. It is not clear how long we need to keep him at these industrial-strength doses.  We are going to start to taper today with a 50% dose reduction.  If this is amiodarone toxicity he will need a prolonged course at more moderate doses of prednisone. Blood cultures negative greater than 72 hours and MRSA screen negative in the nasopharynx so we will not add back in vancomycin to his regimen. Legionella antigen negative.  We have no objection to continuing a 5-day course of azithromycin as recommended by critical care.  Differential for his ARDS remains broad as detailed in Dr. Cristal Generous note today.  My initial impression when we admitted him was decompensated heart failure secondary to opportunistic infection related to recent immunosuppression from radiation and chemotherapy.  This still remains a tenable explanation but amiodarone toxicity rate remains a possibility with radiation pneumonitis less likely.

## 2018-01-19 NOTE — Care Management Important Message (Signed)
Important Message  Patient Details  Name: Justin Blackburn. MRN: [id number] Date of Birth: 04-02-1951   Medicare Important Message Given:  Yes    Kaari Zeigler 01/19/2018, 3:23 PM

## 2018-01-19 NOTE — Consult Note (Signed)
Physical Medicine and Rehabilitation Consult Reason for Consult: Decreased functional mobility Referring Physician: Internal medicine   HPI: Justin Blackburn. is a 67 y.o. right-handed male with complex medical history of CAD with MI status post bypass surgery and drug-eluting stent February 2019.  History taken from chart review, patient, and wife. 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  Recently completed course of chemotherapy and radiation therapy in June.  Interim hospitalization with acute left lower extremity DVT extending from the tibial up to the common femoral veins 12/07/2017.  History of atrial fibrillation diagnosed March 2019.  Maintained on Plavix and Eliquis.  Patient lives with spouse.  Reportedly independent prior to admission.  One level home with 2 steps to entry.  Wife in and out during the day.  Presented 01/14/2018 after he slid to the floor while using his urinal.  He was found to be severely hypoxic with oxygen saturations 66% on room air.  He was placed on nonrebreather mask.  Chest x-ray showed cardiomegaly with diffuse bilateral pulmonary infiltrates consistent with pulmonary edema.  VQ scan negative for pulmonary emboli.  CT of the chest showed additional patchy bilateral lower lobe opacities possibly reflecting superimposed pneumonia.  Troponin  0.07, lactic acid 1.6.  Maintained on Lasix for diuresis as well as intravenous Solu-Medrol.  Patient still requiring high amounts of oxygen.  Follow-up pulmonary services.  Echocardiogram with ejection fraction of 50%, moderate dilation of right ventricle.  Tolerating a regular diet.  Therapy evaluations completed and ongoing with recommendations of physical medicine rehab consult.   Review of Systems  Constitutional: Negative for chills and fever.  HENT: Negative for hearing loss.   Eyes: Negative for blurred vision and double vision.  Respiratory: Positive for cough and  shortness of breath.   Cardiovascular: Positive for palpitations and leg swelling.  Gastrointestinal: Positive for constipation. Negative for nausea and vomiting.  Genitourinary: Negative for dysuria and hematuria.  Musculoskeletal: Positive for myalgias.  Skin: Negative for rash.  Neurological: Positive for weakness.  All other systems reviewed and are negative.  Past Medical History:  Diagnosis Date  . A-fib ([place])    with RVR  . Acute kidney injury ([place])   . Anemia   . Angina   . Arthritis    "hands" (08/10/2017)  . Atrial flutter, paroxysmal ([place])    arrived with aflutter with RVR, EF down to 25-30%, s/p TEE DCCV on 01/29/2015  . Cancer ([place])   . CHF (congestive heart failure) ([place])   . Chronic lower back pain    "all the time from my sciatic nerve"  . Coronary artery disease   . DVT (deep venous thrombosis) ([place])   . Dyspnea   . Erectile dysfunction   . Heart failure ([place])   . Hypercholesteremia   . Hypertension   . 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 Myocardial infarction ([place]) 1988; ~8088;  ~1996;~ 2000  . Obesity   . Peripheral vascular disease ([place])   . Umbilical hernia    unrepaired   Past Surgical History:  Procedure Laterality Date  . CARDIOVERSION N/A 01/29/2015   Procedure: CARDIOVERSION;  Surgeon: [name], MD;  Location: [facility];  Service: Cardiovascular;  Laterality: N/A;  . CORONARY ANGIOPLASTY WITH STENT PLACEMENT  08/31/11   "2 today; makes total of 3 stents"  . CORONARY ANGIOPLASTY WITH STENT PLACEMENT  08/10/2017   "2 today; makes a total of 5 stents" (08/10/2017)  .  CORONARY ARTERY BYPASS GRAFT  1998; 2007   CABG X 4; CABG X4  . CORONARY STENT INTERVENTION N/A 08/10/2017   Procedure: CORONARY STENT INTERVENTION;  Surgeon: Sherren Mocha, MD;  Location: [facility];  Service: Cardiovascular;  Laterality: N/A;  . ESOPHAGOGASTRODUODENOSCOPY (EGD) WITH PROPOFOL N/A 09/01/2017   Procedure:  ESOPHAGOGASTRODUODENOSCOPY (EGD) WITH PROPOFOL;  Surgeon: Laurence Spates, MD;  Location: [facility];  Service: Endoscopy;  Laterality: N/A;  . EUS N/A 09/29/2017   Procedure: UPPER ENDOSCOPIC ULTRASOUND (EUS) LINEAR;  Surgeon: Arta Silence, MD;  Location: [facility];  Service: Endoscopy;  Laterality: N/A;  . LEFT HEART CATH AND CORS/GRAFTS ANGIOGRAPHY N/A 08/10/2017   Procedure: LEFT HEART CATH AND CORS/GRAFTS ANGIOGRAPHY;  Surgeon: Sherren Mocha, MD;  Location: [facility];  Service: Cardiovascular;  Laterality: N/A;  . LEFT HEART CATHETERIZATION WITH CORONARY/GRAFT ANGIOGRAM N/A 08/31/2011   Procedure: LEFT HEART CATHETERIZATION WITH Beatrix Fetters;  Surgeon: [name], MD;  Location: [facility] CATH LAB;  Service: Cardiovascular;  Laterality: N/A;  . PERIPHERAL VASCULAR CATHETERIZATION N/A 04/27/2016   Procedure: Abdominal Aortogram w/Lower Extremity;  Surgeon: Angelia Mould, MD;  Location: [facility];  Service: Cardiovascular;  Laterality: N/A;  . TEE WITHOUT CARDIOVERSION N/A 01/29/2015   Procedure: TRANSESOPHAGEAL ECHOCARDIOGRAM (TEE);  Surgeon: [name], MD;  Location: [facility] ENDOSCOPY;  Service: Cardiovascular;  Laterality: N/A;   Family History  Problem Relation Age of Onset  . Heart disease Mother   . Heart attack Mother   . Diabetes Mother   . Heart disease Father   . Heart attack Father   . Diabetes Father   . Heart attack Brother   . Stroke Neg Hx    Social History:  reports that he quit smoking about 46 years ago. His smoking use included cigarettes. He has a 12.00 pack-year smoking history. He has never used smokeless tobacco. He reports that he does not drink alcohol or use drugs. Allergies: No Known Allergies Medications Prior to Admission  Medication Sig Dispense Refill  . acetaminophen (TYLENOL) 325 MG tablet Take 2 tablets (650 mg total) by mouth every 6 (six) hours as needed for mild pain or headache.    [name] amiodarone  (PACERONE) 200 MG tablet Take 1 tablet (200 mg total) by mouth daily. (Patient taking differently: Take 200 mg by mouth 2 (two) times daily. ) 90 tablet 3  . apixaban (ELIQUIS) 5 MG TABS tablet Take 5 mg by mouth 2 (two) times daily.    [name] atorvastatin (LIPITOR) 40 MG tablet Take 40 mg by mouth daily at 6 PM.   3  . candesartan (ATACAND) 16 MG tablet Take 16 mg by mouth daily.    . carvedilol (COREG) 3.125 MG tablet Take 1 tablet (3.125 mg total) by mouth 2 (two) times daily with a meal. 60 tablet 0  . clopidogrel (PLAVIX) 75 MG tablet Take 1 tablet (75 mg total) by mouth daily. Take for 6 months. Plan to stop on August 27th. Check with cardiologist at that time. 30 tablet 5  . diltiazem (DILACOR XR) 120 MG 24 hr capsule Take 120 mg by mouth daily.    . ferrous sulfate 325 (65 FE) MG tablet Take 325 mg by mouth daily.  3  . gabapentin (NEURONTIN) 300 MG capsule Take 300 mg by mouth 2 (two) times daily.  1  . insulin degludec (TRESIBA FLEXTOUCH) 100 UNIT/ML SOPN FlexTouch Pen Inject 85 Units into the skin daily.    . nitroGLYCERIN (NITROSTAT) 0.4  MG SL tablet Place 1 tablet (0.4 mg total) under the tongue every 5 (five) minutes as needed for chest pain (Angina). 25 tablet 3  . pantoprazole (PROTONIX) 40 MG tablet Take 1 tablet (40 mg total) by mouth daily. 30 tablet 11    Home: Home Living Family/patient expects to be discharged to:: Private residence Living Arrangements: Spouse/significant other Available Help at Discharge: Family, Available 24 hours/day Type of Home: House Home Access: Stairs to enter [employer] of Steps: 2 Entrance Stairs-Rails: Right, Left, Can reach both Home Layout: One level Bathroom Shower/Tub: Tub/shower unit, Multimedia programmer: Handicapped height Home Equipment: Kasandra Knudsen - single point Additional Comments: information gathered from chart review of pt's previous evaluation during a recent admission. 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 States after his cancer treatment in June, he has been weak and hasn't "recovered" Functional Status:  Mobility: Bed Mobility Overal bed mobility: Needs Assistance Bed Mobility: Supine to Sit, Sit to Supine Supine to sit: Min guard Sit to supine: Min guard General bed mobility comments: improvement, min guard able to get into position EOB without physical assistance Transfers Overall transfer level: Needs assistance Equipment used: Rolling walker (2 wheeled) Transfers: Sit to/from Stand, [employer] Transfers Sit to Stand: Mod assist Stand pivot transfers: Min assist General transfer comment: mod A to help power up, pt coughing by this point, able to static stand for 2 minutes, transfer to chair  Ambulation/Gait General Gait Details: defered 2/2 to desatting on 30-35L HFNC    ADL: ADL Overall ADL's : Needs assistance/impaired Eating/Feeding: NPO Eating/Feeding Details (indicate cue type and reason): just beginnning liquids Grooming: Minimal assistance, Sitting Upper Body Bathing: Minimal assistance, Sitting Lower Body Bathing: Moderate assistance, Sit to/from stand Upper Body Dressing : Moderate assistance, Sitting Lower Body Dressing: Maximal assistance, Sit to/from stand Toilet Transfer: +2 for physical assistance, Moderate assistance Toilet Transfer Details (indicate cue type and reason): sit - stand Toileting- Clothing Manipulation and Hygiene: Maximal assistance Toileting - Clothing Manipulation Details (indicate cue type and reason): Greater difficulty controlling BM since radiation; pt frustrated Functional mobility during ADLs: Moderate assistance, +2 for physical assistance(sit - stand only)  Cognition: Cognition Overall Cognitive  Status: Within Functional Limits for tasks assessed Orientation Level: Oriented X4 Cognition Arousal/Alertness: Awake/alert Behavior During Therapy: WFL for tasks assessed/performed Overall Cognitive Status: Within Functional Limits for tasks assessed  Blood pressure 104/65, pulse 65, temperature 97.7 F (36.5 C), temperature source Oral, resp. rate 12, height 5\' 11"  (1.803 m), weight 86.1 kg (189 lb 13.1 oz), SpO2 93 %. Physical Exam  Vitals reviewed. Constitutional: He is oriented to person, place, and time. He appears well-developed and well-nourished.  HENT:  Head: Normocephalic and atraumatic.  Eyes: EOM are normal. Right eye exhibits no discharge. Left eye exhibits no discharge.  Neck: Normal range of motion. Neck supple. No thyromegaly present.  Cardiovascular: Normal rate and regular rhythm.  Respiratory:  Fair inspiratory effort clear to auscultation +[place]  GI: Soft. Bowel sounds are normal. He exhibits no distension.  Musculoskeletal:  Edema LLE >> RLE  Neurological: He is alert and oriented to person, place, and time.  Motor: B/l UE 4+/5 proximal to distal RLE: 4-4+/5 proximal to distal LLE: 4-/5 proximal to distal  Skin: Skin is warm and dry.  Psychiatric: His  speech is normal. He is agitated.    Results for orders placed or performed during the hospital encounter of 01/14/18 (from the past 24 hour(s))  Glucose, capillary     Status: Abnormal   Collection Time: 01/18/18  4:22 PM  Result Value Ref Range   Glucose-Capillary 333 (H) 70 - 99 mg/dL  Glucose, capillary     Status: Abnormal   Collection Time: 01/19/18 12:50 AM  Result Value Ref Range   Glucose-Capillary 449 (H) 70 - 99 mg/dL  CBC     Status: Abnormal   Collection Time: 01/19/18  2:30 AM  Result Value Ref Range   WBC 8.3 4.0 - 10.5 K/uL   RBC 3.92 (L) 4.22 - 5.81 MIL/uL   Hemoglobin 9.3 (L) 13.0 - 17.0 g/dL   HCT 30.6 (L) 39.0 - 52.0 %   MCV 78.1 78.0 - 100.0 fL   MCH 23.7 (L) 26.0 - 34.0 pg   MCHC  30.4 30.0 - 36.0 g/dL   RDW 23.2 (H) 11.5 - 15.5 %   Platelets 333 150 - 400 K/uL  Basic metabolic panel     Status: Abnormal   Collection Time: 01/19/18  2:30 AM  Result Value Ref Range   Sodium 133 (L) 135 - 145 mmol/L   Potassium 3.2 (L) 3.5 - 5.1 mmol/L   Chloride 93 (L) 98 - 111 mmol/L   CO2 29 22 - 32 mmol/L   Glucose, Bld 455 (H) 70 - 99 mg/dL   BUN 35 (H) 8 - 23 mg/dL   Creatinine, Ser 1.24 0.61 - 1.24 mg/dL   Calcium 7.6 (L) 8.9 - 10.3 mg/dL   GFR calc non Af Amer 58 (L) >60 mL/min   GFR calc Af Amer >60 >60 mL/min   Anion gap 11 5 - 15  Magnesium     Status: None   Collection Time: 01/19/18  2:30 AM  Result Value Ref Range   Magnesium 1.8 1.7 - 2.4 mg/dL  Phosphorus     Status: None   Collection Time: 01/19/18  2:30 AM  Result Value Ref Range   Phosphorus 3.4 2.5 - 4.6 mg/dL  Glucose, capillary     Status: Abnormal   Collection Time: 01/19/18  4:54 AM  Result Value Ref Range   Glucose-Capillary 390 (H) 70 - 99 mg/dL  Glucose, capillary     Status: Abnormal   Collection Time: 01/19/18  8:11 AM  Result Value Ref Range   Glucose-Capillary 325 (H) 70 - 99 mg/dL  TSH     Status: Abnormal   Collection Time: 01/19/18  8:56 AM  Result Value Ref Range   TSH 0.111 (L) 0.350 - 4.500 uIU/mL  T4, free     Status: None   Collection Time: 01/19/18  8:56 AM  Result Value Ref Range   Free T4 1.41 0.82 - 1.77 ng/dL  Glucose, capillary     Status: Abnormal   Collection Time: 01/19/18 12:31 PM  Result Value Ref Range   Glucose-Capillary 343 (H) 70 - 99 mg/dL   Comment 1 Notify RN    Comment 2 Document in Chart    Dg Chest Port 1 View  Result Date: 01/18/2018 CLINICAL DATA:  67 year old male respiratory failure and shortness breath. Subsequent encounter. EXAM: PORTABLE CHEST 1 VIEW COMPARISON:  [id number] chest CT.  01/15/2018 chest x-ray. FINDINGS: Post CABG.  Cardiomegaly.  Calcified aorta. Slight improved aeration right lung base. Persistent pulmonary edema and left lower lobe  consolidation which may represent superimposed left  lower lobe infiltrate. No obvious pneumothorax. No acute osseous abnormality noted. IMPRESSION: 1. Slight improved aeration right lung base. 2. Persistent pulmonary edema and left lower lobe consolidation/infiltrate. 3. Cardiomegaly post CABG 4.  Aortic Atherosclerosis (ICD10-I70.0). Electronically Signed   By: Genia Del M.D.   On: 01/18/2018 07:12    Assessment/Plan: Diagnosis: Debility Labs independently reviewed.  Records reviewed and summated above.  1. Does the need for close, 24 hr/day medical supervision in concert with the patient's rehab needs make it unreasonable for this patient to be served in a less intensive setting? Potentially  2. Co-Morbidities requiring supervision/potential complications:  CAD with MI status post bypass surgery (cont meds), poorly differentiated adenocarcinoma of the esophagus, steroid induced hyperglycemia (Monitor in accordance with exercise and adjust meds as necessary), hypokalemia (continue to monitor and replete as necessary), supplemental oxygen dependent (wean as tolerated) 3. Due to safety, disease management and patient education, does the patient require 24 hr/day rehab nursing? Yes 4. Does the patient require coordinated care of a physician, rehab nurse, PT (1-2 hrs/day, 5 days/week) and OT (1-2 hrs/day, 5 days/week) to address physical and functional deficits in the context of the above medical diagnosis(es)? Potentially Addressing deficits in the following areas: balance, endurance, locomotion, strength, transferring, bathing, dressing, toileting and psychosocial support 5. Can the patient actively participate in an intensive therapy program of at least 3 hrs of therapy per day at least 5 days per week? No 6. The potential for patient to make measurable gains while on inpatient rehab is good 7. Anticipated functional outcomes upon discharge from inpatient rehab are supervision  with PT, supervision  with OT, n/a with SLP. 8. Estimated rehab length of stay to reach the above functional goals is: 3-7 days. 9. Anticipated D/C setting: Home 10. Anticipated post D/C treatments: HH therapy and Home excercise program 11. Overall Rehab/Functional Prognosis: good  RECOMMENDATIONS: This patient's condition is appropriate for continued rehabilitative care in the following setting: Patient unable to tolerate 3 hours of therapy/day at present.  Further, patient predominantly limited by endurance. Anticipate pt will continue to make functional gains as respiratory status improves. If patient has limited functional progress recommend SNF for less intense therapies. Recommend palliative care consult.   Patient has agreed to participate in recommended program. Potentially Note that insurance prior authorization may be required for reimbursement for recommended care.  Comment: Rehab Admissions Coordinator to follow up.   I have personally performed a face to face diagnostic evaluation, including, but not limited to relevant history and physical exam findings, of this patient and developed relevant assessment and plan.  Additionally, I have reviewed and concur with the physician assistant's documentation above.   Delice Lesch, MD, ABPMR Lavon Paganini Angiulli, PA-C 01/19/2018

## 2018-01-19 NOTE — Progress Notes (Signed)
Name: Justin Blackburn. MRN: [id number] DOB: [birth year]    ADMISSION DATE:  01/14/2018 CONSULTATION DATE:  8/4  REFERRING MD :  [facility] /Dr. Virgina Jock   CHIEF COMPLAINT:  Hypoxia   BRIEF PATIENT DESCRIPTION: 67 year old male with minimal smoking history admitted August 2 with decompensated congestive heart failure.  Patient has esophageal cancer status post recent chemo and XRT.  Patient with progressive hypoxemia despite diuresis.  Pulmonary consulted August 4  8/7 The patient is in the stepdown unit. He appears a little more comfortable He has a persistent dry cough. He iis on HFNC at about 15 lpm. His color is better. His appetite is very good ( perhaps inpart to steroids)     STUDIES:  2D echo 8/2> EF 45 to 50%, moderate dilation of right ventricle, PAP 44  Prior to admit--- Venous Doppler December 07, 2017 acute DVT left leg common femoral vein, femoral, popliteal, posterior tibial, peroneal, left external iliac.  PET scan 4/4> mild to moderate hyper metabolic and distal esophagus.  No findings for pulmonary or hepatic metastatic disease.  No adenopathy.  VQ scan 6/25 normal, no evidence of PE  CT chest with contrast 7/25 no evidence for metastatic adenopathy.  Small bilateral pleural effusions with interstitial and pulmonary edema.  Radiation effects of the distal esophageal   HISTORY OF PRESENT ILLNESS:   66 year old male with minimal smoking history with recent diagnosis (09/2017)  of esophageal adenocarcinoma status post chemo and radiation.  Patient has a history of coronary artery disease status post CABG in 1998 and subsequent redo in 2007.  Stents placed in 2013.  And March 2019.  Plavix started in March 2019.  Since radiation patient has significant difficulty eating with weight loss.  As of recent says that that has improved some.  He has no nausea or vomiting. Was diagnosed with a extensive DVT on the left leg December 07, 2017 started on Eliquis.  During this timeframe he started  having increased shortness of breath.  A VQ scan was done that was normal with no evidence of PE.  Patient had a follow-up CT chest January 06, 2018 that showed no evidence of metastatic adenopathy.  There was small bilateral pleural effusions with interstitial and pulmonary edema that appeared to be somewhat new.  Along with radiation effects of the along the distal esophagus.  Patient says the night before current admission that he slipped off the bed with no apparent perceived injury.  However the next morning patient became more more short of breath.  He was transferred via EMS from home to the emergency room.  Felt to have decompensated congestive heart failure.  Previous echo showed EF 45 to 50%.  Echo was repeated with no significant change this admission patient has been diuresed since admission with a -5 L removed. White count is slightly elevated.  And he has developed some low-grade temps.  Lactic acid 1.7 on admission.  Chest x-ray yesterday showed bilateral opacities concerning for edema.  Oxygen demands have increased progressively and now patient is requiring a nonrebreather to keep O2 saturations at 90%.  On arrival patient is in the bed alert and oriented follows commands.  And answers questions appropriately.  O2 saturations are 89 to 90% on 100% nonrebreather mask.  Wife is at bedside been updated  SUBJECTIVE:  Off BiPAP HFNC weaned down to 80%, speaking in full sentences   VITAL SIGNS: Temp:  [97.5 F (36.4 C)-98 F (36.7 C)] 97.6 F (36.4 C) (08/07 0736) Pulse  Rate:  [60-88] 62 (08/07 0736) Resp:  [8-26] 21 (08/07 0736) BP: (106-137)/(58-74) 106/65 (08/07 0736) SpO2:  [86 %-95 %] 93 % (08/07 0736) FiO2 (%):  [80 %-100 %] 85 % (08/07 0729) Weight:  [189 lb 13.1 oz (86.1 kg)] 189 lb 13.1 oz (86.1 kg) (08/07 0359)  PHYSICAL EXAMINATION: General: chronically ill appearing on HFNC Neuro: Alert and interactive, moving all ext to command HEENT: [place]/AT, PERRL, EOM-I and  MMM Cardiovascular: IRIR, Nl S1/S2 and -M/R/G Lungs: Diffuse crackles Abdomen: Soft, NT, ND and +BS Musculoskeletal: intact  Skin: no rash, -edema and -tenderness  Recent Labs  Lab 01/17/18 0223 01/18/18 0337 01/19/18 0230  NA 136 138 133*  K 3.4* 3.8 3.2*  CL 97* 95* 93*  CO2 _0 BUN 17 24* 35*  CREATININE 0.94 1.09 1.24  GLUCOSE 102* 170* 455*   Recent Labs  Lab 01/16/18 0210 01/18/18 0337 01/19/18 0230  HGB 9.5* 10.5* 9.3*  HCT 30.3* 34.0* 30.6*  WBC 10.6* 5.6 8.3  PLT 321 323 333   Dg Chest Port 1 View  Result Date: 01/18/2018 CLINICAL DATA:  67 year old male respiratory failure and shortness breath. Subsequent encounter. EXAM: PORTABLE CHEST 1 VIEW COMPARISON:  [id number] chest CT.  01/15/2018 chest x-ray. FINDINGS: Post CABG.  Cardiomegaly.  Calcified aorta. Slight improved aeration right lung base. Persistent pulmonary edema and left lower lobe consolidation which may represent superimposed left lower lobe infiltrate. No obvious pneumothorax. No acute osseous abnormality noted. IMPRESSION: 1. Slight improved aeration right lung base. 2. Persistent pulmonary edema and left lower lobe consolidation/infiltrate. 3. Cardiomegaly post CABG 4.  Aortic Atherosclerosis (ICD10-I70.0). Electronically Signed   By: Genia Del M.D.   On: 01/18/2018 07:12   I reviewed CXR myself, pulmonary infiltrate noted.  ASSESSMENT / PLAN: 1. Acute Hypoxic respiratory failure-in the setting of decompensated congestive heart failure.  Patient is immunosuppressed with low-grade temp and mildly elevated white blood cell count.  Suspect has secondary PNA  Doubt PE as recent VQ and CT w/ contrast neg . Compliant w/ Eliquis .  Concern with recent chemo , unlikely a  pneumonitis or aspiration as finished chemo several weeks ago and swallowing was improving .   8/5 >LA 2.61>1.7>2.0 , PCT 0.21  CT chest 8/4 >sm B effusion R>>L , interstitial edema , patchy BB opacities w/ air bronchograms  Episode  of collapse and severe hypoxia - improved with BIPAP   Plan  Continue cefepime and vanc, started 8/4 Azithr added 8/5, will continue for a total of 5 days Follow CX data  Continue diureses as ordered Pan culture . Leigonella  ag negative Increase solumedrol to 125 mg IV q6 x3 days since procal is reassuring Check RVP ordered 8/6 Auto-immune work ordered 8/6. 8/7 Will repeat CXR tomorrow  2. DVT Left   Plan  Cont Eliquis   3. CHF  8/5 >neg 7 L bal , wt down 16lb since admit  Plan  Cont diuresis   4. A Fib  On Amiodarone -check ESR  ? amio toxicity .  Heart rate well controlled presently  Plan  Trial of steroids -Solumedrol 79m q6  D/C amiodarone  4. Hypokalemia  Hypomagesium   Plan  Replace electrolytes as indicated  Discussed with PCCM-NP.  Discussed code status at length and explained need for high dose steroids and concern for respiratory failure.  Full code for now, short term intubation only.  Hopefully thepatientwill continue to improve. If not he ahas agreed to shoirt term intubation.  If he gets intubated we canpursue BAL. Will maintain high dose steroids. Amiodarone on hold   Micheal Likens MD  [place]. Pager: [phone]. After hours pager: [phone].  01/19/2018, 10:36 AM

## 2018-01-19 NOTE — Progress Notes (Signed)
Progress Note  Patient Name: Justin Blackburn. Date of Encounter: 01/19/2018  Primary Cardiologist: [name], MD   Subjective   67 year old gentleman with a history of coronary artery disease, CABG and stenting of the left main in March, 2019.  He has a history of distal esophageal adenocarcinoma and is status post radiation and chemotherapy.  He has PAF and has been on Eliquis.  Net diuresis of 9.7 L so far during this admission.  Inpatient Medications    Scheduled Meds: . apixaban  5 mg Oral BID  . atorvastatin  40 mg Oral q1800  . carvedilol  3.125 mg Oral BID WC  . chlorhexidine  15 mL Mouth Rinse BID  . clopidogrel  75 mg Oral Daily  . feeding supplement (ENSURE ENLIVE)  237 mL Oral BID BM  . [START ON 01/20/2018] furosemide  40 mg Oral BID  . gabapentin  300 mg Oral BID  . insulin aspart  0-15 Units Subcutaneous Q4H  . mouth rinse  15 mL Mouth Rinse q12n4p  . mouth rinse  15 mL Mouth Rinse q12n4p  . methylPREDNISolone (SOLU-MEDROL) injection  125 mg Intravenous Q6H  . pantoprazole  40 mg Oral Daily  . potassium chloride  40 mEq Oral Once  . sucralfate  1 g Oral TID WC & HS   Continuous Infusions: . ceFEPime (MAXIPIME) IV 1 g (01/19/18 0609)  . magnesium sulfate 1 - 4 g bolus IVPB     PRN Meds: acetaminophen **OR** acetaminophen, polyethylene glycol   Vital Signs    Vitals:   01/19/18 0500 01/19/18 0600 01/19/18 0729 01/19/18 0736  BP:    106/65  Pulse: 63 64 66 62  Resp: 18 19 20  (!) 21  Temp:    97.6 F (36.4 C)  TempSrc:    Oral  SpO2: 94% 92% 93% 93%  Weight:      Height:        Intake/Output Summary (Last 24 hours) at 01/19/2018 0859 Last data filed at 01/19/2018 0650 Gross per 24 hour  Intake 945.35 ml  Output 1925 ml  Net -979.65 ml   Filed Weights   01/17/18 1616 01/18/18 0500 01/19/18 0359  Weight: 190 lb 14.7 oz (86.6 kg) 188 lb 7.9 oz (85.5 kg) 189 lb 13.1 oz (86.1 kg)    Telemetry    NSR with PACs  - Personally Reviewed  ECG      NSR  - Personally Reviewed  Physical Exam   GEN: chronically ill appearing man.   NAD  Neck: No JVD Cardiac: reg. Rate.  Occasional premature beats  Respiratory:  coarse rales, rhonchi on both bases. GI: Soft, nontender, non-distended  MS: No edema; No deformity. Neuro:  Nonfocal  Psych: Normal affect   Labs    Chemistry Recent Labs  Lab 01/14/18 0707  01/17/18 0223 01/18/18 0337 01/19/18 0230  NA 131*   < > 136 138 133*  K 4.4   < > 3.4* 3.8 3.2*  CL 98   < > 97* 95* 93*  CO2 22   < > 31 29 29   GLUCOSE 174*   < > 102* 170* 455*  BUN 21   < > 17 24* 35*  CREATININE 1.25*   < > 0.94 1.09 1.24  CALCIUM 8.2*   < > 7.5* 7.9* 7.6*  PROT 5.9*  --   --   --   --   ALBUMIN 1.8*  --   --   --   --  AST 43*  --   --   --   --   ALT 25  --   --   --   --   ALKPHOS 169*  --   --   --   --   BILITOT 0.9  --   --   --   --   GFRNONAA 57*   < > >60 >60 58*  GFRAA >60   < > >60 >60 >60  ANIONGAP 11   < > 8 14 11    < > = values in this interval not displayed.     Hematology Recent Labs  Lab 01/16/18 0210 01/18/18 0337 01/19/18 0230  WBC 10.6* 5.6 8.3  RBC 4.03* 4.47 3.92*  HGB 9.5* 10.5* 9.3*  HCT 30.3* 34.0* 30.6*  MCV 75.2* 76.1* 78.1  MCH 23.6* 23.5* 23.7*  MCHC 31.4 30.9 30.4  RDW 23.9* 23.4* 23.2*  PLT 321 323 333    Cardiac Enzymes Recent Labs  Lab 01/14/18 0932 01/14/18 1227 01/14/18 2058 01/17/18 1616  TROPONINI 0.07* 0.10* 0.09* 0.04*    Recent Labs  Lab 01/14/18 0716  TROPIPOC 0.08     BNP Recent Labs  Lab 01/14/18 0707  BNP 781.3*     DDimer No results for input(s): DDIMER in the last 168 hours.   Radiology    Dg Chest Port 1 View  Result Date: 01/18/2018 CLINICAL DATA:  67 year old male respiratory failure and shortness breath. Subsequent encounter. EXAM: PORTABLE CHEST 1 VIEW COMPARISON:  [id number] chest CT.  01/15/2018 chest x-ray. FINDINGS: Post CABG.  Cardiomegaly.  Calcified aorta. Slight improved aeration right lung base.  Persistent pulmonary edema and left lower lobe consolidation which may represent superimposed left lower lobe infiltrate. No obvious pneumothorax. No acute osseous abnormality noted. IMPRESSION: 1. Slight improved aeration right lung base. 2. Persistent pulmonary edema and left lower lobe consolidation/infiltrate. 3. Cardiomegaly post CABG 4.  Aortic Atherosclerosis (ICD10-I70.0). Electronically Signed   By: Genia Del M.D.   On: 01/18/2018 07:12    Cardiac Studies      Patient Profile     67 y.o. male  With respiratory failure and PAF   Assessment & Plan    1.  Respiratory failure: Patient is diuresed approximately 9.7 L.  At this point I think that were fairly close to being euvolemic.  Creatinine and BUN have started to increase.  He still has significant coarse rales and rhonchi in his bases which I suspect is due to some pulmonary process.  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 He is been on amiodarone for several months and the amiodarone has now been stopped with the thought that he might have developed some amiodarone lung toxicity.  It appears that he is only been on amiodarone for several months and I doubt that this is long enough to have caused amiodarone lung  Toxicity.  He remains in NSR and I agree with holding amio for now  2.  Paroxysmal atrial fibrillation: Patient remains in normal sinus rhythm.  Agree with holding his amiodarone for the possibility of amiodarone lung toxicity.        For questions or updates, please contact [place] Please consult [email or web address] for contact info under Cardiology/STEMI.      Signed, Mertie Moores, MD  01/19/2018, 8:59 AM

## 2018-01-19 NOTE — Progress Notes (Signed)
Physical Therapy Treatment Patient Details Name: Justin Blackburn. MRN: [id number] DOB: 06/06/1951 Today's Date: 01/19/2018    History of Present Illness Pt is a 67 y.o. male with PMH of CAD (s/p CABG, stents), HTN, esophageal adenocarcinoma (stage T3) s/p chemo/radiation, PAD, a-flutter/a-fib, DM2, and recent LLE DVT (12/07/17), admitted 01/14/18 after fall. Worked up for CHF exacerbation and hypoxemia. Pt with worsening respiratory status on 8/5 requiring continuous BiPAP and transfer to ICU.     PT Comments    Pt satting well at rest today on 25L and 80% FiO2. Progressed level of independence with bed mobility sitting EOB with min guard this session. Tolerated standing however began coughing and desatting to high 70s low 80s, despite being increased to 30L. Min A to transfer to chair and with 5 minutes of rest returned to 90's. Agree with recs for CIR pending pt progression.     Follow Up Recommendations  CIR;Supervision/Assistance - 24 hour(pending pt progression)     Equipment Recommendations  None recommended by PT    Recommendations for Other Services       Precautions / Restrictions Precautions Precautions: Fall Precaution Comments: watch SpO2; currently on High Flow 25L, 80% FiO2 Restrictions Weight Bearing Restrictions: No    Mobility  Bed Mobility Overal bed mobility: Needs Assistance Bed Mobility: Supine to Sit;Sit to Supine     Supine to sit: Min guard Sit to supine: Min guard   General bed mobility comments: improvement, min guard able to get into position EOB without physical assistance  Transfers Overall transfer level: Needs assistance Equipment used: Rolling walker (2 wheeled) Transfers: Sit to/from [facility] Sit to Stand: Mod assist Stand pivot transfers: Min assist       General transfer comment: mod A to help power up, pt coughing by this point, able to static stand for 2 minutes, transfer to chair   Ambulation/Gait              General Gait Details: defered 2/2 to desatting on 30-35L HFNC   Stairs             Wheelchair Mobility    Modified Rankin (Stroke Patients Only)       Balance Overall balance assessment: Needs assistance Sitting-balance support: Feet supported Sitting balance-Leahy Scale: Fair     Standing balance support: Bilateral upper extremity supported Standing balance-Leahy Scale: Poor Standing balance comment: Reliant on UE and external support                            Cognition Arousal/Alertness: Awake/alert Behavior During Therapy: WFL for tasks assessed/performed Overall Cognitive Status: Within Functional Limits for tasks assessed                                        Exercises      General Comments        Pertinent Vitals/Pain Pain Assessment: No/denies pain    Home Living                      Prior Function            PT Goals (current goals can now be found in the care plan section) Acute Rehab PT Goals 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 : Unable Difficulty sitting down on and standing up from a chair with arms (e.g., wheelchair, bedside commode, etc,.)?: Unable Help needed moving to and from a bed to chair (including a wheelchair)?: A Little Help needed walking in hospital room?: A Lot Help needed climbing 3-5 steps with a railing? : Total 6 Click Score: 11    End of Session  Equipment Utilized During Treatment: Gait belt;Oxygen(25-35L) Activity Tolerance: Patient limited by fatigue Patient left: in bed;with call bell/phone within reach;with bed alarm set;with family/visitor present Nurse Communication: Mobility status PT Visit Diagnosis: Other abnormalities of gait and mobility (R26.89);Muscle weakness (generalized) (M62.81)     Time: 1353-1410 PT Time Calculation (min) (ACUTE ONLY): 17 min  Charges:  $Therapeutic Activity: 8-22 mins                     Reinaldo Berber, PT, DPT Acute Rehab Services Pager: [phone]     Reinaldo Berber 01/19/2018, 2:10 PM

## 2018-01-20 ENCOUNTER — Inpatient Hospital Stay (HOSPITAL_COMMUNITY): Payer: Medicare HMO

## 2018-01-20 DIAGNOSIS — L89151 Pressure ulcer of sacral region, stage 1: Secondary | ICD-10-CM

## 2018-01-20 DIAGNOSIS — J80 Acute respiratory distress syndrome: Secondary | ICD-10-CM

## 2018-01-20 DIAGNOSIS — E1165 Type 2 diabetes mellitus with hyperglycemia: Secondary | ICD-10-CM

## 2018-01-20 LAB — BASIC METABOLIC PANEL
Anion gap: 8 (ref 5–15)
BUN: 32 mg/dL — AB (ref 8–23)
CO2: 32 mmol/L (ref 22–32)
CREATININE: 0.96 mg/dL (ref 0.61–1.24)
Calcium: 8 mg/dL — ABNORMAL LOW (ref 8.9–10.3)
Chloride: 97 mmol/L — ABNORMAL LOW (ref 98–111)
GFR calc Af Amer: >60 mL/min (ref >60)
GFR calc non Af Amer: >60 mL/min (ref >60)
Glucose, Bld: 288 mg/dL — ABNORMAL HIGH (ref 70–99)
POTASSIUM: 3.6 mmol/L (ref 3.5–5.1)
SODIUM: 137 mmol/L (ref 135–145)

## 2018-01-20 LAB — GLUCOSE, CAPILLARY
GLUCOSE-CAPILLARY: 404 mg/dL — AB (ref 70–99)
Glucose-Capillary: 242 mg/dL — ABNORMAL HIGH (ref 70–99)
Glucose-Capillary: 394 mg/dL — ABNORMAL HIGH (ref 70–99)
Glucose-Capillary: 339 mg/dL — ABNORMAL HIGH (ref 70–99)

## 2018-01-20 LAB — GLUCOSE, RANDOM: Glucose, Bld: 369 mg/dL — ABNORMAL HIGH (ref 70–99)

## 2018-01-20 LAB — MAGNESIUM: Magnesium: 2.1 mg/dL (ref 1.7–2.4)

## 2018-01-20 MED ORDER — POTASSIUM CHLORIDE CRYS ER 20 MEQ PO TBCR
40.0000 meq | EXTENDED_RELEASE_TABLET | Freq: Two times a day (BID) | ORAL | Status: DC
  Administered 2018-01-20 – 2018-01-27 (×15): 40 meq via ORAL
  Filled 2018-01-20 (×15): qty 2

## 2018-01-20 MED ORDER — INSULIN GLARGINE 100 UNIT/ML ~~LOC~~ SOLN
37.0000 [IU] | Freq: Every day | SUBCUTANEOUS | Status: AC
  Administered 2018-01-20: 37 [IU] via SUBCUTANEOUS
  Filled 2018-01-20: qty 0.37

## 2018-01-20 MED ORDER — INSULIN ASPART 100 UNIT/ML ~~LOC~~ SOLN
6.0000 [IU] | Freq: Three times a day (TID) | SUBCUTANEOUS | Status: DC
  Administered 2018-01-21 (×3): 6 [IU] via SUBCUTANEOUS

## 2018-01-20 NOTE — Progress Notes (Signed)
Subjective: No acute events overnight. Justin Blackburn looks well this morning. He continues to be on HFNC and has a dry cough today. His appetite is good, in part due to steroids. We discussed continuing steroids per pulmonology recommendations and continue trying to wean oxygen.   Objective:  Vital signs in last 24 hours: Vitals:   01/19/18 2321 01/20/18 0419 01/20/18 0725 01/20/18 0742  BP: (!) 143/73 131/78  121/73  Pulse: 63 70 66 73  Resp: 20 (!) 22 16 11   Temp: (!) 97.5 F (36.4 C) (!) 97.5 F (36.4 C)  (!) 97.5 F (36.4 C)  TempSrc: Oral Oral  Oral  SpO2: 91% 93% (!) 88% (!) 87%  Weight:  86.8 kg    Height:       Gen: Frail appearing, NAD, laying in bed on HFNC ENT: OP without exudate.  CV: RRR, no murmurs, no JVD Pulm: Increased effort at rest and while speaking, fine, dry crackles extending from bilateral bases to mid lung, no wheezing Abd: Soft, NT, ND, normal BS.  Ext: Warm, Left lower extremity with 1-2+ pitting edema to the calf. Right lower extremity without edema  8/4 Chest CT with interstitial edema, small bilateral pleural effusions, and additional patchy/ground glass bilateral lower lobe opacities with air bronchograms concerning for superimposed pneumonia.   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  #Acute hypoxic respiratory failure: HFNC weaned to 25L 60% FiO2 (from 80% FiO2). O2 sats 96% at rest but decreased to 87% while talking. Differential includes amiodarone pulmonary toxicity vs. Radiation pneumonitis or radiation induced scaring. I would think his radiation treatment was more focalized on the esophagus and doubt it would cause such diffuse pulmonary scaring.  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 It's peak onset is 1-3 months after completion of radiation and Mr. Trow completed radiation at the end of June 2019. Amiodarone pulmonary toxicity is also possible. Even though he was only on Amiodarone for 1 month, it can occur even within 2 weeks of starting Amiodarone.  - CXR 8/8 largely unchanged from prior - wean O2 as tolerated - Lasix 40mg  po BID. If this is ineffective. Consider switching to Torsemide 40mg  BID - Continue Azithromycin (8/5 - 8/9) for a 5 day course - Continue Cefepime (8/4 - ?)  - Continue Solu-Medrol 125mg  IV q6h. Would like to begin taper if pulmonology agrees.  - Continue holding amiodarone. Respiratory status seemed to improve with discontinuation of amiodarone and initiation of high dose steroids. Although, he was only taking Amiodarone since December 07, 2017 and cardiology does not feel this is long enough to cause pulmonary toxicity. Regardless, steroids are the treatment for radiation pneumonitis and amiodarone toxicity.  - BCx no growth at 3 days - If fevers, add back azithromycin and vancomycin and redraw blood cultures  #Acute on chronic combined systolic and diastolic congestive heart failure secondary to ischemic cardiomyopathy   Acute pulmonary edema ([place]): Diuresis has stabilized. BUN and creatine improved today after stopping IV lasix yesterday. - Discontinue IV lasix - START po lasix 40mg  BID - Replete K+ and Mg+ as needed - continue home coreg, plavix, and statin    - not on ACE/ ARB due to orthostatic hypotension in the past,  we should resume this at a very low dose when he is stable  - cardiology is on board, we appreciate their assistance in the care of this patient.   #Type 2 NSTEMI - troponin peaked at 0.10  - type 2 and secondary to demand ischemia from volume overload  - remains chest pain free   #Atrial fibrillation, paroxysmal (HCC) - Converted to NSR with PACs  - Discontinued amiodarone due to  possibility of pulmonary toxicity - Continue carvedilol 3.125 twice daily for rate control -Continue Eliquis for stroke prophylaxis and DVT as noted below -Continue electrolyte and telemetry monitoring  #Microcytic anemia  - ferritin elevated, but unreliable as it is an acute phase reactant  - continue daily iron supplementation - monitor CBC   #History of DVT (deep vein thrombosis) - continue eliquis   #Diabetes mellitus type 2, insulin dependent ([place]): glucose increased with IV steroids. Good response yesterday with 42 units Lantus but remains hyperglycemic. - Blood glucose 248-338 over the past 24 hours. Increased with IV steroids - Continue blood glucose monitoring and SSI - If high dose steroids continue, will give Lantus 47 units today - Novolog 0-20 units with meals  #Pressure injury of skin: Stage 1 Sacrum   Malnutrition of moderate degree -Dietitian following, we appreciate their assistance in the care of this patient  Dispo: Anticipated discharge in approximately 3-5 day(s).   [name], MD 01/20/2018, 11:17 AM Pager: [phone]

## 2018-01-20 NOTE — Progress Notes (Signed)
Pharmacy Antibiotic Note  Justin Blackburn. is a 67 y.o. male admitted on 01/14/2018 with pneumonia.  Pharmacy has been consulted for Cefepime dosing. Currently on D#5 of cefepime and D#4 of azithromycin. WBC wnl. Remains culture negative.   Plan: -Cefepime 1 gm IV Q 8 hours -Azithromycin 500 mg IV Q 24 hours per MD -Finish 5 day course of Cefepime and azithromycin per CCM  Height: 5\' 11"  (180.3 cm) Weight: 191 lb 5.8 oz (86.8 kg) IBW/kg (Calculated) : 75.3  Temp (24hrs), Avg:97.6 F (36.4 C), Min:97.5 F (36.4 C), Max:97.7 F (36.5 C)  Recent Labs  Lab 01/14/18 0707  01/14/18 0938 01/14/18 1227 01/14/18 1534 01/15/18 0326 01/16/18 0210 01/16/18 1341 01/17/18 0223 01/18/18 0337 01/19/18 0230 01/20/18 0234  WBC 12.3*  --   --   --   --  10.1 10.6*  --   --  5.6 8.3  --   CREATININE 1.25*   < >  --   --   --  1.05 0.93  --  0.94 1.09 1.24 0.96  LATICACIDVEN  --    < > 2.61* 1.6 1.7  --   --  2.0*  --  1.2  --   --    < > = values in this interval not displayed.    Estimated Creatinine Clearance: 77.3 mL/min (by C-G formula based on SCr of 0.96 mg/dL).    No Known Allergies  Cefepime 8/4>> Vanc 8/4>>8/5 Azithro 8/5>>  Bcx x2 8/4: ngtd Sputum 8/4: sent Resp panel 8/6: neg  8/2 MRSA PCR neg    Thank you for allowing pharmacy to be a part of this patient's care.  Albertina Parr, PharmD., BCPS Clinical Pharmacist Clinical phone for 01/20/18 until 3:30pm: [phone] If after 3:30pm, please refer to [facility] for unit-specific pharmacist

## 2018-01-20 NOTE — Progress Notes (Addendum)
Inpatient Diabetes Program Recommendations  AACE/ADA: New Consensus Statement on Inpatient Glycemic Control (2015)  Target Ranges:  Prepandial:   less than 140 mg/dL      Peak postprandial:   less than 180 mg/dL (1-2 hours)      Critically ill patients:  140 - 180 mg/dL   Results for Justin Blackburn, Justin Blackburn (MRN [id number]) as of 01/20/2018 08:44  Ref. Range 01/19/2018 08:11 01/19/2018 12:31 01/19/2018 17:15 01/19/2018 21:35 01/20/2018 08:04  Glucose-Capillary Latest Ref Range: 70 - 99 mg/dL 325 (H) 343 (H) 267 (H) 338 (H) 242 (H)   Review of Glycemic Control  Diabetes history: DM 2 Outpatient Diabetes medications: Tresiba 85 units Daily Current orders for Inpatient glycemic control: Lantus 10 units, Novolog 0-20 units tid  Inpatient Diabetes Program Recommendations:    Lantus 42 units given yesterday, half of patient's home dose. However, only Lantus 10 ordered for today. Please consider increasing Lantus back up to at least 42 units if not increasing slightly since fasting glucose is 242 this am. Noted steroids are still ordered IV Solumedrol 125 mg Q6 hours.  Thanks,  Tama Headings RN, MSN, BC-ADM Inpatient Diabetes Coordinator Team Pager [phone] (8a-5p)

## 2018-01-20 NOTE — Progress Notes (Signed)
Medicine attending: I examined this patient today together with resident physician Dr Francesco Runner and I concur with her evaluation and management plan. We appreciate ongoing assistance from pulmonology and cardiology. We were able to taper his oxygen from 80% to 60% FiO2 and maintain saturations in the low 90s. Persistent bilateral dry rales two thirds the way up bilaterally.  Not likely fluid given excellent diuresis.  We will transition to oral furosemide per cardiology rec. He remains in sinus rhythm.  Off amiodarone. Benefit of continuing high-dose steroids outweighs risks at this point and we will begin to taper. Impression: ARDS with decompensated congestive heart failure Likely multifactorial with underlying cardiac disease, potential opportunistic infection status post chemotherapy and radiation therapy, and potential amiodarone drug toxicity. Slow improvement.  Discussed with wife and patient that it is difficult to predict time to recovery and extent of recovery.  Overall situation remains unstable at this time and he requires ongoing hospitalization and treatment.

## 2018-01-20 NOTE — Progress Notes (Signed)
 Name: Justin F Fant Jr. MRN: [id number] DOB: 05/27/1951    ADMISSION DATE:  01/14/2018 CONSULTATION DATE:  8/4  REFERRING MD :  [facility] /Dr. Kilma   CHIEF COMPLAINT:  Hypoxia   BRIEF PATIENT DESCRIPTION: 67-year-old male with minimal smoking history admitted August 2 with decompensated congestive heart failure.  Patient has esophageal cancer status post recent chemo and XRT.  Patient with progressive hypoxemia despite diuresis.  Pulmonary consulted August 4  8/7 The patient is in the stepdown unit. He appears a little more comfortable He has a persistent dry cough. He iis on HFNC at about 15 lpm. His color is better. His appetite is very good ( perhaps inpart to steroids) 8/8 The patient appears reasonbly comfortble today He is on 25 lppm high flow 02. He is maintaining his 02 sats at rest. When he moves however, his 02 sat still plummets. He does nt have a productive cough. Has been afebbrile      STUDIES:  2D echo 8/2> EF 45 to 50%, moderate dilation of right ventricle, PAP 44  Prior to admit--- Venous Doppler December 07, 2017 acute DVT left leg common femoral vein, femoral, popliteal, posterior tibial, peroneal, left external iliac.  PET scan 4/4> mild to moderate hyper metabolic and distal esophagus.  No findings for pulmonary or hepatic metastatic disease.  No adenopathy.  VQ scan 6/25 normal, no evidence of PE  CT chest with contrast 7/25 no evidence for metastatic adenopathy.  Small bilateral pleural effusions with interstitial and pulmonary edema.  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  Patient has a history of coronary artery disease status post CABG in 1998 and subsequent redo in 2007.  Stents placed in 2013.  And March 2019.  Plavix started in March 2019.  Since radiation patient has significant  difficulty eating with weight loss.  As of recent says that that has improved some.  He has no nausea or vomiting. Was diagnosed with a extensive DVT on the left leg December 07, 2017 started on Eliquis.  During this timeframe he started having increased shortness of breath.  A VQ scan was done that was normal with no evidence of PE.  Patient had a follow-up CT chest January 06, 2018 that showed no evidence of metastatic adenopathy.  There was small bilateral pleural effusions with interstitial and pulmonary edema that appeared to be somewhat new.  Along with radiation effects of the along the distal esophagus.  Patient says the night before current admission that he slipped off the bed with no apparent perceived injury.  However the next morning patient became more more short of breath.  He was transferred via EMS from home to the emergency room.  Felt to have decompensated congestive heart failure.  Previous echo showed EF 45 to 50%.  Echo was repeated with no significant change this admission patient has been diuresed since admission with a -5 L removed. White count is slightly elevated.  And he has developed some low-grade temps.  Lactic acid 1.7 on admission.  Chest x-ray yesterday showed bilateral opacities concerning for edema.  Oxygen demands have increased progressively and now patient is requiring a nonrebreather to keep O2 saturations at 90%.  On arrival patient is in the bed alert and oriented follows commands.  And answers questions appropriately.  O2 saturations are 89 to 90% on 100%   nonrebreather mask.  Wife is at bedside been updated  SUBJECTIVE:  Off BiPAP HFNC weaned down to 80%, speaking in full sentences   VITAL SIGNS: Temp:  [97.5 F (36.4 C)-97.7 F (36.5 C)] 97.5 F (36.4 C) (08/08 0742) Pulse Rate:  [63-73] 73 (08/08 0742) Resp:  [11-25] 11 (08/08 0742) BP: (104-143)/(65-78) 121/73 (08/08 0742) SpO2:  [87 %-98 %] 87 % (08/08 0742) FiO2 (%):  [60 %-85 %] 60 % (08/08 0742) Weight:   [86.8 kg] 86.8 kg (08/08 0419)  PHYSICAL EXAMINATION: General: chronically ill appearing on HFNC Neuro: Alert and interactive, moving all ext to command HEENT: [place]/AT, PERRL, EOM-I and MMM Cardiovascular: IRIR, Nl S1/S2 and -M/R/G Lungs: Diffuse crackles Abdomen: Soft, NT, ND and +BS Musculoskeletal: intact  Skin: no rash, -edema and -tenderness  Recent Labs  Lab 01/18/18 0337 01/19/18 0230 01/20/18 0234  NA 138 133* 137  K 3.8 3.2* 3.6  CL 95* 93* 97*  CO2 29 29 32  BUN 24* 35* 32*  CREATININE 1.09 1.24 0.96  GLUCOSE 170* 455* 288*   Recent Labs  Lab 01/16/18 0210 01/18/18 0337 01/19/18 0230  HGB 9.5* 10.5* 9.3*  HCT 30.3* 34.0* 30.6*  WBC 10.6* 5.6 8.3  PLT 321 323 333   No results found. I reviewed CXR myself, pulmonary infiltrate noted.  ASSESSMENT / PLAN: 1. Acute Hypoxic respiratory failure-in the setting of decompensated congestive heart failure.  Patient is immunosuppressed with low-grade temp and mildly elevated white blood cell count.  Suspect has secondary PNA  Doubt PE as recent VQ and CT w/ contrast neg . Compliant w/ Eliquis .  Concern with recent chemo , unlikely a  pneumonitis or aspiration as finished chemo several weeks ago and swallowing was improving .   8/8 The p[atient developed wiorsenign hypoxemia and respiratory distress a few days ago. He remains on broad spectrum abx therapy with cefpime and Azithromycin. I have ordered a repeat CXR. We were concerned about the possibility of amiodarone toxicity as the patient has been taking it the last two months at a dose of 400mg a day. The Amiodarone was stopped and the patient placed onhigh dose steroids in the interim. I don not bleieve the radiatation therpay is pklaying a large paart in his resp. failure 2. DVT Left   Plan  Cont Eliquis   3. CHF  8/5 >neg 7 L bal , wt down 16lb since admit  Plan  Cont diuresis 8/8 I don't think there is astrong element of CHF at this point   4. A Fib  On  Amiodarone -check ESR  ? amio toxicity .  Heart rate well controlled presently. 8/8 Heart rate remains well controlled  Plan  Trial of steroids -Solumedrol125mg q 6h D/C amiodarone  4. Hypokalemia  Hypomagesium   Plan  Replace electrolytes as indicated  Discussed with PCCM-NP.  Discussed code status at length and explained need for high dose steroids and concern for respiratory failure.  Full code for now, short term intubation only. The patient is back on the step down unit presently.  Hopefull,y the patientwill continue to improve. If not he ahas agreed to shoirt term intubation. If he gets intubated we canpursue BAL. Will maintain high dose steroids. Amiodarone on hold     MD  [facility]. Pager: [phone]. After hours pager: [phone].  01/20/2018, 9:26 AM   

## 2018-01-20 NOTE — Progress Notes (Signed)
Critical CBG 404, STAT lab placed and MD paged.  MD returned page, will order base coverage for each meal and 20 units sliding scale should be administered.

## 2018-01-20 NOTE — Progress Notes (Signed)
Occupational Therapy Treatment Patient Details Name: Justin Blackburn. MRN: [id number] DOB: 1950-11-16 Today's Date: 01/20/2018    History of present illness Pt is a 67 y.o. male with PMH of CAD (s/p CABG, stents), HTN, esophageal adenocarcinoma (stage T3) s/p chemo/radiation, PAD, a-flutter/a-fib, DM2, and recent LLE DVT (12/07/17), admitted 01/14/18 after fall. Worked up for CHF exacerbation and hypoxemia. Pt with worsening respiratory status on 8/5 requiring continuous BiPAP and transfer to ICU.    OT comments  Pt progressing towards established OT goals. However, continues to present with decreased activity tolerance and fatigues quickly. Pt performing functional mobility to sink with Min A +2 for safety and then washing his hands with Mod A for standing balance. SpO2 dropping to 70s on 25L High Flow with 60% FiO2 blend during activity. Pt coughing frequently with activity and requiring a sitting rest break at sink once grooming completed.  Pt requiring increased time to recover and SpO2 rising to 90%. Continue to recommend post-acute rehab and will continue to follow acutely as admitted.     Follow Up Recommendations  Supervision/Assistance - 24 hour;CIR;SNF(pending progress)    Equipment Recommendations  3 in 1 bedside commode    Recommendations for Other Services      Precautions / Restrictions Precautions Precautions: Fall Precaution Comments: watch SpO2; currently on High Flow 25L, 60% FiO2 Restrictions Weight Bearing Restrictions: No       Mobility Bed Mobility Overal bed mobility: Needs Assistance Bed Mobility: Supine to Sit     Supine to sit: Supervision     General bed mobility comments: supervision for safety  Transfers Overall transfer level: Needs assistance Equipment used: Rolling walker (2 wheeled) Transfers: Sit to/from Stand Sit to Stand: Min assist;Mod assist;+2 safety/equipment         General transfer comment: Min A for power up into standing with  VCs for hand placement. Mod A for safe descent to recliner    Balance Overall balance assessment: Needs assistance Sitting-balance support: Feet supported Sitting balance-Leahy Scale: Fair     Standing balance support: Bilateral upper extremity supported Standing balance-Leahy Scale: Poor Standing balance comment: Reliant on UE and external support                           ADL either performed or assessed with clinical judgement   ADL Overall ADL's : Needs assistance/impaired     Grooming: Moderate assistance;Standing;Wash/dry hands;Minimal assistance Grooming Details (indicate cue type and reason): Initial Min A for standing balance at sink. Transition to Mod A for balance due to increased fatigues; noted leaning left. Heavy reliant on UE support                 Toilet Transfer: Moderate assistance;+2 for safety/equipment;Cueing for sequencing;Ambulation;RW;Minimal assistance Toilet Transfer Details (indicate cue type and reason): Min A for power up into standing. Safe descend to recliner with Mod A.         Functional mobility during ADLs: Minimal assistance;Moderate assistance;+2 for safety/equipment;Rolling walker;Cueing for sequencing;Cueing for safety General ADL Comments: Pt presenting with decreased activity tolerance and fatigues quickly.      Vision       Perception     Praxis      Cognition Arousal/Alertness: Awake/alert Behavior During Therapy: WFL for tasks assessed/performed Overall Cognitive Status: Within Functional Limits for tasks assessed  Exercises     Shoulder Instructions       General Comments Wife present throughout session.    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 OT Goal Formulation: With patient/family Time For Goal Achievement: 02/01/18 Potential to Achieve Goals: Good ADL Goals Pt Will Perform Grooming: with set-up;standing Pt Will Perform Upper Body Bathing: with set-up;sitting Pt Will Perform Lower Body Bathing: with set-up;with supervision;sit to/from stand Pt Will Transfer to Toilet: with supervision;bedside commode;ambulating Pt Will Perform Toileting - Clothing Manipulation and hygiene: with supervision  Plan Discharge plan remains appropriate    Co-evaluation                 AM-PAC PT "6 Clicks" Daily Activity     Outcome Measure   Help from another person eating meals?: A Little Help from another person taking care of personal grooming?: A Lot Help from another person toileting, which includes using toliet, bedpan, or urinal?: A Lot Help from another person bathing (including washing, rinsing, drying)?: A Lot Help from another person to put on and taking off regular upper body clothing?: A Lot Help from another person to put on and taking off regular lower body clothing?: A Lot 6 Click Score: 13    End of Session Equipment Utilized During Treatment: Gait belt;Oxygen(25 L)  OT Visit Diagnosis: Unsteadiness on feet (R26.81);Muscle weakness (generalized) (M62.81)   Activity Tolerance Patient limited by fatigue(desat to 73 on 25L)   Patient Left with call bell/phone within reach;with family/visitor present;in chair(chair position)   Nurse Communication Mobility status(SpO2)        Time: 4462-8638 OT Time Calculation (min): 15 min  Charges: OT General Charges $OT Visit: 1 Visit OT Treatments $Self Care/Home Management : 8-22 mins  [place], OTR/L Acute Rehab Pager: [phone] Office:  [place] 01/20/2018, 3:57 PM

## 2018-01-20 NOTE — Progress Notes (Signed)
Progress Note  Patient Name: Justin Blackburn. Date of Encounter: 01/20/2018  Primary Cardiologist: [name], MD   Subjective   67 year old gentleman with a history of CAD, CABG.  He has a history of distal esophageal adenocarcinoma and status post radiation therapy and chemotherapy.  He has a history of paroxysmal atrial fibrillation and has been on Eliquis.  We have vigorously diuresed him and he is -9.7 L. Inc. that he is fairly euvolemic at this time. He continues to have coarse rales posteriorly.  Inpatient Medications    Scheduled Meds: . apixaban  5 mg Oral BID  . atorvastatin  40 mg Oral q1800  . benzonatate  200 mg Oral TID  . carvedilol  3.125 mg Oral BID WC  . chlorhexidine  15 mL Mouth Rinse BID  . clopidogrel  75 mg Oral Daily  . furosemide  40 mg Oral BID  . gabapentin  300 mg Oral BID  . insulin aspart  0-20 Units Subcutaneous TID WC  . insulin glargine  10 Units Subcutaneous Daily  . mouth rinse  15 mL Mouth Rinse q12n4p  . mouth rinse  15 mL Mouth Rinse q12n4p  . methylPREDNISolone (SOLU-MEDROL) injection  125 mg Intravenous Q6H  . multivitamin with minerals  1 tablet Oral Daily  . pantoprazole  40 mg Oral Daily  . potassium chloride  40 mEq Oral BID  . sucralfate  1 g Oral TID WC & HS   Continuous Infusions: . azithromycin Stopped (01/19/18 1830)  . ceFEPime (MAXIPIME) IV 1 g (01/20/18 0981)   PRN Meds: acetaminophen **OR** acetaminophen, polyethylene glycol   Vital Signs    Vitals:   01/19/18 2321 01/20/18 0419 01/20/18 0725 01/20/18 0742  BP: (!) 143/73 131/78  121/73  Pulse: 63 70 66 73  Resp: 20 (!) 22 16 11   Temp: (!) 97.5 F (36.4 C) (!) 97.5 F (36.4 C)  (!) 97.5 F (36.4 C)  TempSrc: Oral Oral  Oral  SpO2: 91% 93% (!) 88% (!) 87%  Weight:  86.8 kg    Height:        Intake/Output Summary (Last 24 hours) at 01/20/2018 0917 Last data filed at 01/20/2018 0600 Gross per 24 hour  Intake 2027.23 ml  Output 1950 ml  Net 77.23  ml   Filed Weights   01/18/18 0500 01/19/18 0359 01/20/18 0419  Weight: 85.5 kg 86.1 kg 86.8 kg    Telemetry    NSR / numerous PACs  - Personally Reviewed  ECG     - Personally Reviewed  Physical Exam   GEN:  Chronically ill-appearing elderly gentleman, no acute distress Neck: No JVD Cardiac:  Rate is irregular. Respiratory:  coarse Expiratory rales in the bases. GI: Soft, nontender, non-distended  MS:  2 + edema on left lower leg ,  1+ on right lower leg  Neuro:  Nonfocal  Psych: Normal affect   Labs    Chemistry Recent Labs  Lab 01/14/18 0707  01/18/18 0337 01/19/18 0230 01/20/18 0234  NA 131*   < > 138 133* 137  K 4.4   < > 3.8 3.2* 3.6  CL 98   < > 95* 93* 97*  CO2 22   < > 29 29 32  GLUCOSE 174*   < > 170* 455* 288*  BUN 21   < > 24* 35* 32*  CREATININE 1.25*   < > 1.09 1.24 0.96  CALCIUM 8.2*   < > 7.9* 7.6* 8.0*  PROT 5.9*  --   --   --   --   ALBUMIN 1.8*  --   --   --   --   AST 43*  --   --   --   --   ALT 25  --   --   --   --   ALKPHOS 169*  --   --   --   --   BILITOT 0.9  --   --   --   --   GFRNONAA 57*   < > >60 58* >60  GFRAA >60   < > >60 >60 >60  ANIONGAP 11   < > 14 11 8    < > = values in this interval not displayed.     Hematology Recent Labs  Lab 01/16/18 0210 01/18/18 0337 01/19/18 0230  WBC 10.6* 5.6 8.3  RBC 4.03* 4.47 3.92*  HGB 9.5* 10.5* 9.3*  HCT 30.3* 34.0* 30.6*  MCV 75.2* 76.1* 78.1  MCH 23.6* 23.5* 23.7*  MCHC 31.4 30.9 30.4  RDW 23.9* 23.4* 23.2*  PLT 321 323 333    Cardiac Enzymes Recent Labs  Lab 01/14/18 0932 01/14/18 1227 01/14/18 2058 01/17/18 1616  TROPONINI 0.07* 0.10* 0.09* 0.04*    Recent Labs  Lab 01/14/18 0716  TROPIPOC 0.08     BNP Recent Labs  Lab 01/14/18 0707  BNP 781.3*     DDimer No results for input(s): DDIMER in the last 168 hours.   Radiology    No results found.  Cardiac Studies      Patient Profile     67 y.o. male admitted with respiratory  failure.  Assessment & Plan    1.  Respiratory failure: The patient is diuresed 9.7 so far during this admission.  He still has some leg edema but I think that intravascularly he is fairly close to euvolemic.  I advised him to elevate his legs throughout the day to try to get some this edema to improve. He is been changed to Lasix 40 mg twice a day.  If this dose of Lasix does not work consider changing him to torsemide 40 mg twice a day  2.  Paroxysmal atrial for ablation: His heart rate is irregular but he appears to have P waves on telemetry.  Is more consistent with sinus rhythm with numerous PACs or perhaps wandering atrial pacemaker.       For questions or updates, please contact [place] Please consult [email or web address] for contact info under Cardiology/STEMI.      Signed, Mertie Moores, MD  01/20/2018, 9:17 AM

## 2018-01-21 LAB — CULTURE, BLOOD (ROUTINE X 2)
Culture: NO GROWTH
Culture: NO GROWTH
Special Requests: ADEQUATE
Special Requests: ADEQUATE

## 2018-01-21 LAB — CBC
HCT: 31.2 % — ABNORMAL LOW (ref 39.0–52.0)
Hemoglobin: 9.4 g/dL — ABNORMAL LOW (ref 13.0–17.0)
MCH: 23.4 pg — ABNORMAL LOW (ref 26.0–34.0)
MCHC: 30.1 g/dL (ref 30.0–36.0)
MCV: 77.8 fL — ABNORMAL LOW (ref 78.0–100.0)
PLATELETS: 321 10*3/uL (ref 150–400)
RBC: 4.01 MIL/uL — ABNORMAL LOW (ref 4.22–5.81)
RDW: 23 % — AB (ref 11.5–15.5)
WBC: 11.9 10*3/uL — AB (ref 4.0–10.5)

## 2018-01-21 LAB — BASIC METABOLIC PANEL
Anion gap: 9 (ref 5–15)
BUN: 30 mg/dL — ABNORMAL HIGH (ref 8–23)
CALCIUM: 8.1 mg/dL — AB (ref 8.9–10.3)
CO2: 30 mmol/L (ref 22–32)
Chloride: 99 mmol/L (ref 98–111)
Creatinine, Ser: 0.95 mg/dL (ref 0.61–1.24)
GFR calc Af Amer: >60 mL/min (ref >60)
Glucose, Bld: 222 mg/dL — ABNORMAL HIGH (ref 70–99)
POTASSIUM: 4.2 mmol/L (ref 3.5–5.1)
SODIUM: 138 mmol/L (ref 135–145)

## 2018-01-21 LAB — GLUCOSE, CAPILLARY
GLUCOSE-CAPILLARY: 278 mg/dL — AB (ref 70–99)
Glucose-Capillary: 179 mg/dL — ABNORMAL HIGH (ref 70–99)
Glucose-Capillary: 229 mg/dL — ABNORMAL HIGH (ref 70–99)
Glucose-Capillary: 218 mg/dL — ABNORMAL HIGH (ref 70–99)

## 2018-01-21 MED ORDER — METHYLPREDNISOLONE SODIUM SUCC 125 MG IJ SOLR
125.0000 mg | Freq: Two times a day (BID) | INTRAMUSCULAR | Status: DC
  Administered 2018-01-21 – 2018-01-22 (×2): 125 mg via INTRAVENOUS
  Filled 2018-01-21 (×2): qty 2

## 2018-01-21 MED ORDER — INSULIN GLARGINE 100 UNIT/ML ~~LOC~~ SOLN
10.0000 [IU] | Freq: Every day | SUBCUTANEOUS | Status: AC
  Administered 2018-01-21: 10 [IU] via SUBCUTANEOUS
  Filled 2018-01-21: qty 0.1

## 2018-01-21 MED ORDER — INSULIN GLARGINE 100 UNIT/ML ~~LOC~~ SOLN
20.0000 [IU] | Freq: Every day | SUBCUTANEOUS | Status: DC
  Administered 2018-01-22 – 2018-01-24 (×3): 20 [IU] via SUBCUTANEOUS
  Filled 2018-01-21 (×3): qty 0.2

## 2018-01-21 MED ORDER — INSULIN ASPART 100 UNIT/ML ~~LOC~~ SOLN
10.0000 [IU] | Freq: Three times a day (TID) | SUBCUTANEOUS | Status: DC
  Administered 2018-01-22 – 2018-01-24 (×9): 10 [IU] via SUBCUTANEOUS

## 2018-01-21 MED ORDER — GERHARDT'S BUTT CREAM
TOPICAL_CREAM | Freq: Three times a day (TID) | CUTANEOUS | Status: DC | PRN
  Filled 2018-01-21: qty 1

## 2018-01-21 NOTE — Progress Notes (Signed)
Progress Note  Patient Name: Justin Blackburn. Date of Encounter: 01/21/2018  Primary Cardiologist: [name], MD   Subjective   67 year old gentleman with a history of CAD, CABG.  He has a history of distal esophageal adenocarcinoma and status post radiation therapy and chemotherapy.  He has a history of paroxysmal atrial fibrillation and has been on Eliquis.  The patient is diuresed 11 L so far during this admission.  Creatinine and  BUN have been stable.   Inpatient Medications    Scheduled Meds: . apixaban  5 mg Oral BID  . atorvastatin  40 mg Oral q1800  . benzonatate  200 mg Oral TID  . carvedilol  3.125 mg Oral BID WC  . chlorhexidine  15 mL Mouth Rinse BID  . clopidogrel  75 mg Oral Daily  . furosemide  40 mg Oral BID  . gabapentin  300 mg Oral BID  . insulin aspart  0-20 Units Subcutaneous TID WC  . insulin aspart  6 Units Subcutaneous TID WC  . insulin glargine  10 Units Subcutaneous Daily  . mouth rinse  15 mL Mouth Rinse q12n4p  . methylPREDNISolone (SOLU-MEDROL) injection  125 mg Intravenous Q6H  . multivitamin with minerals  1 tablet Oral Daily  . pantoprazole  40 mg Oral Daily  . potassium chloride  40 mEq Oral BID  . sucralfate  1 g Oral TID WC & HS   Continuous Infusions: . azithromycin 500 mg (01/20/18 1627)  . ceFEPime (MAXIPIME) IV 1 g (01/21/18 0658)   PRN Meds: acetaminophen **OR** acetaminophen, polyethylene glycol   Vital Signs    Vitals:   01/21/18 0359 01/21/18 0526 01/21/18 0708 01/21/18 0813  BP: 135/85   136/84  Pulse: 66   81  Resp: (!) 22     Temp: 97.6 F (36.4 C)   (!) 97.5 F (36.4 C)  TempSrc: Oral   Oral  SpO2: 90%  91% (!) 84%  Weight:  88.1 kg    Height:        Intake/Output Summary (Last 24 hours) at 01/21/2018 0948 Last data filed at 01/21/2018 0400 Gross per 24 hour  Intake 680 ml  Output 1700 ml  Net -1020 ml   Filed Weights   01/19/18 0359 01/20/18 0419 01/21/18 0526  Weight: 86.1 kg 86.8 kg 88.1 kg     Telemetry    NSR with numerous PACs ( wandering atrial pacemaker)   - Personally Reviewed  ECG     - Personally Reviewed  Physical Exam   Physical Exam: Blood pressure 136/84, pulse 81, temperature (!) 97.5 F (36.4 C), temperature source Oral, resp. rate (!) 22, height 5\' 11"  (1.803 m), weight 88.1 kg, SpO2 (!) 84 %.  GEN:   Chronically ill-appearing gentleman, no acute distress. HEENT: Normal NECK: No JVD; No carotid bruits LYMPHATICS: No lymphadenopathy CARDIAC: Regular rate. RESPIRATORY: Coarse rales in the bases. ABDOMEN: Soft, non-tender, non-distended MUSCULOSKELETAL: 2+ edema in the left leg, 1+ edema in the right leg SKIN: Warm and dry NEUROLOGIC:  Alert and oriented x 3   Labs    Chemistry Recent Labs  Lab 01/19/18 0230 01/20/18 0234 01/20/18 2043 01/21/18 0340  NA 133* 137  --  138  K 3.2* 3.6  --  4.2  CL 93* 97*  --  99  CO2 29 32  --  30  GLUCOSE 455* 288* 369* 222*  BUN 35* 32*  --  30*  CREATININE 1.24 0.96  --  0.95  CALCIUM 7.6* 8.0*  --  8.1*  GFRNONAA 58* >60  --  >60  GFRAA >60 >60  --  >60  ANIONGAP 11 8  --  9     Hematology Recent Labs  Lab 01/18/18 0337 01/19/18 0230 01/21/18 0340  WBC 5.6 8.3 11.9*  RBC 4.47 3.92* 4.01*  HGB 10.5* 9.3* 9.4*  HCT 34.0* 30.6* 31.2*  MCV 76.1* 78.1 77.8*  MCH 23.5* 23.7* 23.4*  MCHC 30.9 30.4 30.1  RDW 23.4* 23.2* 23.0*  PLT 323 333 321    Cardiac Enzymes Recent Labs  Lab 01/14/18 1227 01/14/18 2058 01/17/18 1616  TROPONINI 0.10* 0.09* 0.04*    No results for input(s): TROPIPOC in the last 168 hours.   BNP No results for input(s): BNP, PROBNP in the last 168 hours.   DDimer No results for input(s): DDIMER in the last 168 hours.   Radiology    Dg Chest Port 1 View  Result Date: 01/20/2018 CLINICAL DATA:  Respiratory failure EXAM: PORTABLE CHEST 1 VIEW COMPARISON:  01/18/2018 FINDINGS: Cardiomegaly, CABG changes and bilateral airspace opacities are again noted. Bilateral  LOWER lung atelectasis/consolidation and small effusions again identified. There is no evidence of pneumothorax. There has been little interval change since the prior study. IMPRESSION: Unchanged appearance of the chest with cardiomegaly, bilateral airspace opacities/edema, bilateral LOWER lung atelectasis/consolidation and small effusions. Electronically Signed   By: Margarette Canada M.D.   On: 01/20/2018 12:52    Cardiac Studies      Patient Profile     67 y.o. male admitted with respiratory failure.  Assessment & Plan    1.  Respiratory failure: Patient has diuresed over 11 L so far during this admission.  He is been changed to oral Lasix 40 mg twice a day.  His Lasix dose does not work we might consider torsemide 40 mg twice a day.  2.  Paroxysmal atrial fibrillatoin : he has NSR with wandering atrial pacer this am   3.  DVT :  Hx of DVT in the left lower leg.    Continue Eliquis .      For questions or updates, please contact [place] Please consult [email or web address] for contact info under Cardiology/STEMI.      Signed, Mertie Moores, MD  01/21/2018, 9:48 AM

## 2018-01-21 NOTE — Progress Notes (Signed)
Medicine attending: I examined this patient today together with resident physician Dr Francesco Runner and I concur with her evaluation and management plan which will be detailed in her progress note to follow.  Slow but steady progress.  We have been able to wean his FiO2 down to 40%.  Oxygen saturation 92-94%.  Breathing comfortably at rest.  Persistent, diffuse, bilateral, dry rales reflecting pulmonary fibrosis and not fluid.  Blood cultures negative x5 days.  No other pathogens isolated to date.  Day 5 total antibiotics.  I would be comfortable stopping all antibiotics at this time. Situation discussed with pulmonologist at the bedside.  He is in agreement with beginning steroid taper at this time. Wife ever present.  Status and management plan reviewed.

## 2018-01-21 NOTE — Progress Notes (Signed)
Subjective: No acute events overnight. Mr. Cutler continues to look well and is without concerns today. He is still on HFNC with continued dry cough. We have discussed tapering the steroids and antibiotics.   Objective:  Vital signs in last 24 hours: Vitals:   01/21/18 1647 01/21/18 1758 01/21/18 1845 01/21/18 2029  BP:    118/78  Pulse:    75  Resp:    (!) 24  Temp:    (!) 97.4 F (36.3 C)  TempSrc:    Oral  SpO2: 96% 96% 100% 100%  Weight:      Height:       General: chronically ill appearing, no acute distress  Cardiac: regular rate and rhythm, no murmurs, no peripheral edema, no JVD Pulm: diffuse fine crackles  GI: abdomen is soft, non tender, non distended  Skin: there is a small red erythematous at the top of the gluteal cleft without skin breakdown   Assessment/Plan:  Mr. Dershem is a 67 yo male with history ofchronic combinedCHF secondary to ischemic cardiomyopathywithDESto the LADand LCx in Feb 2019 and s/p CABG x2, esophogeal adenocarcinoma s/p chemoradiation, afib/flutter who presented with hypoxemia to 66% on RA and was admitted for volume overload and hypoxic respiratory failure.  #Acute hypoxic respiratory failure:  He remains on HFNC, he is at 40% FiO2 at this time with 8lpm flow rate and oxygen saturation at 100, he does desat to low 80s with movement, we will continue to wean oxygen. Differential includes amiodarone pulmonary toxicity vs. Radiation pneumonitis or radiation induced scaring.  - CXR 8/8 largely unchanged from prior - wean O2 as tolerated - Lasix 40mg  po BID. If this is ineffective. Consider switching to Torsemide 40mg  BID - will discontinue Azithromycin (8/5 - 8/9) as he has completed a 5 day course - Continue Cefepime (8/4 - ?)  - taper Solu-Medrol 125mg  IV q12h - Continue holding amiodarone. Respiratory status seemed to improve with discontinuation of amiodarone and initiation of high dose steroids. Although, he was only taking Amiodarone  since December 07, 2017 and cardiology does not feel this is long enough to cause pulmonary toxicity. Regardless, steroids are the treatment for radiation pneumonitis and amiodarone toxicity.  - BCx no growth at 5 days - If fevers, add back azithromycin and vancomycin and redraw blood cultures  #Acute on chronic combined systolic and diastolic congestive heart failure secondary to ischemic cardiomyopathy  #Acute pulmonary edema ([place]): Diuresis has stabilized. BUN and creatine remain improved today after stopping IV lasix.  - continue po lasix 40mg  BID - Replete K+ and Mg+ as needed - continue home coreg, plavix, and statin   -not onACE/ ARBdue to orthostatic hypotension in the past, we should resume this at a very low dose when he is stable - cardiology is on board, we appreciate their assistance in the care of this patient.   #Type 2 NSTEMI - troponin peaked at 0.10  - type 2 and secondary to demand ischemia from volume overload  - remains chest pain free   #Atrial fibrillation, paroxysmal (HCC) - remains in NSR with PACs  - Discontinued amiodarone due to possibility of pulmonary toxicity - Continue carvedilol 3.125 twice daily for rate control -Continue Eliquis for stroke prophylaxis and DVT as noted below -Continue electrolyte and telemetry monitoring  #Microcytic anemia  - hemoglobin remains stable at 9-10 - continue daily iron supplementation  #History of DVT (deep vein thrombosis) - continue eliquis   #Diabetes mellitus type 2, insulin dependent ([place]): glucose increased with  IV steroids. Good response yesterday with 42 units Lantus but remains hyperglycemic. - Blood glucose 180-400 over the past 24 hours. Increased with IV steroids - lantus increased to 20 units today  - Novolog 10 units with meals  - Continue blood glucose monitoring and SSI  #Pressure injury of skin: Stage 1 Sacrum   Malnutrition of moderate degree -Dietitian following, we appreciate their  assistance in the care of this patient  Dispo: Anticipated discharge in approximately 3-4 day(s).   Francesco Runner, MD 01/21/2018, 8:50 PM [phone]

## 2018-01-21 NOTE — Progress Notes (Signed)
Name: Justin Blackburn. MRN: [id number] DOB: Mar 19, 1951    ADMISSION DATE:  01/14/2018 CONSULTATION DATE:  8/4  REFERRING MD :  [facility] /Dr. Virgina Jock   CHIEF COMPLAINT:  Hypoxia   BRIEF PATIENT DESCRIPTION: 67 year old male with minimal smoking history admitted August 2 with decompensated congestive heart failure.  Patient has esophageal cancer status post recent chemo and XRT.  Patient with progressive hypoxemia despite diuresis.  Pulmonary consulted August 4  8/7 The patient is in the stepdown unit. He appears a little more comfortable He has a persistent dry cough. He iis on HFNC at about 15 lpm. His color is better. His appetite is very good ( perhaps inpart to steroids) 8/8 The patient appears reasonbly comfortble today He is on 25 lppm high flow 02. He is maintaining his 02 sats at rest. When he moves however, his 02 sat still plummets. He does nt have a productive cough. Has been afebbrile. 8/9 The patent is sitting up in a chair. He says he feels a bit better. His appetite is good. I spoke to IM and we have agreed to take his steroid dose down at this time. It is palying  [place] with his blood glucose,       STUDIES:  2D echo 8/2> EF 45 to 50%, moderate dilation of right ventricle, PAP 44  Prior to admit--- Venous Doppler December 07, 2017 acute DVT left leg common femoral vein, femoral, popliteal, posterior tibial, peroneal, left external iliac.  PET scan 4/4> mild to moderate hyper metabolic and distal esophagus.  No findings for pulmonary or hepatic metastatic disease.  No adenopathy.  VQ scan 6/25 normal, no evidence of PE  CT chest with contrast 7/25 no evidence for metastatic adenopathy.  Small bilateral pleural effusions with interstitial and pulmonary edema.  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  Patient has a history of coronary artery disease status post CABG in 1998 and subsequent redo in 2007.  Stents placed in 2013.  And March 2019.  Plavix started in March 2019.  Since radiation patient has significant difficulty eating with weight loss.  As of recent says that that has improved some.  He has no nausea or vomiting. Was diagnosed with a extensive DVT on the left leg December 07, 2017 started on Eliquis.  During this timeframe he started having increased shortness of breath.  A VQ scan was done that was normal with no evidence of PE.  Patient had a follow-up CT chest January 06, 2018 that showed no evidence of metastatic adenopathy.  There was small bilateral pleural effusions with interstitial and pulmonary edema that appeared to be somewhat new.  Along with radiation effects of the along the distal esophagus.  Patient says the night before current admission that he slipped off the bed with no apparent perceived injury.  However the next morning patient became more more short of breath.  He was transferred via EMS from home to the emergency room.  Felt to have decompensated congestive heart failure.  Previous echo showed EF 45 to 50%.  Echo was repeated with no significant change this admission patient has been diuresed since admission with a -5 L removed. White count is slightly elevated.  And he has developed some low-grade temps.  Lactic acid 1.7 on admission.  Chest x-ray yesterday showed bilateral opacities concerning for  edema.  Oxygen demands have increased progressively and now patient is requiring a nonrebreather to keep O2 saturations at 90%.  On arrival patient is in the bed alert and oriented follows commands.  And answers questions appropriately.  O2 saturations are 89 to 90% on 100% nonrebreather mask.  Wife is at bedside been updated     VITAL SIGNS: Temp:  [97.4 F (36.3 C)-97.9 F (36.6 C)] 97.5 F (36.4 C) (08/09 0813) Pulse Rate:  [63-81] 81 (08/09 0813) Resp:   [13-25] 22 (08/09 0359) BP: (109-136)/(63-85) 136/84 (08/09 0813) SpO2:  [84 %-93 %] 84 % (08/09 0813) FiO2 (%):  [50 %-60 %] 50 % (08/09 0708) Weight:  [88.1 kg] 88.1 kg (08/09 0526)  PHYSICAL EXAMINATION: General: chronically ill appearing on HFNC Neuro: Alert and interactive, moving all ext to command HEENT: [place]/AT, PERRL, EOM-I and MMM Cardiovascular: IRIR, Nl S1/S2 and -M/R/G Lungs: Diffuse crackles Abdomen: Soft, NT, ND and +BS Musculoskeletal: intact  Skin: no rash, -edema and -tenderness  Recent Labs  Lab 01/19/18 0230 01/20/18 0234 01/20/18 2043 01/21/18 0340  NA 133* 137  --  138  K 3.2* 3.6  --  4.2  CL 93* 97*  --  99  CO2 29 32  --  30  BUN 35* 32*  --  30*  CREATININE 1.24 0.96  --  0.95  GLUCOSE 455* 288* 369* 222*   Recent Labs  Lab 01/18/18 0337 01/19/18 0230 01/21/18 0340  HGB 10.5* 9.3* 9.4*  HCT 34.0* 30.6* 31.2*  WBC 5.6 8.3 11.9*  PLT 323 333 321   Dg Chest Port 1 View  Result Date: 01/20/2018 CLINICAL DATA:  Respiratory failure EXAM: PORTABLE CHEST 1 VIEW COMPARISON:  01/18/2018 FINDINGS: Cardiomegaly, CABG changes and bilateral airspace opacities are again noted. Bilateral LOWER lung atelectasis/consolidation and small effusions again identified. There is no evidence of pneumothorax. There has been little interval change since the prior study. IMPRESSION: Unchanged appearance of the chest with cardiomegaly, bilateral airspace opacities/edema, bilateral LOWER lung atelectasis/consolidation and small effusions. Electronically Signed   By: Margarette Canada M.D.   On: 01/20/2018 12:52   I reviewed CXR myself, pulmonary infiltrate noted.  ASSESSMENT / PLAN: 1. Acute Hypoxic respiratory failure-in the setting of decompensated congestive heart failure.  Patient is immunosuppressed with low-grade temp and mildly elevated white blood cell count.  Suspect has secondary PNA  Doubt PE as recent VQ and CT w/ contrast neg . Compliant w/ Eliquis .  Concern with  recent chemo , unlikely a  pneumonitis or aspiration as finished chemo several weeks ago and swallowing was improving .   8/8 The p[atient developed wiorsenign hypoxemia and respiratory distress a few days ago. He remains on broad spectrum abx therapy with cefpime and Azithromycin. I have ordered a repeat CXR. We were concerned about the possibility of amiodarone toxicity as the patient has been taking it the last two months at a dose of 41m a day. The Amiodarone was stopped and the patient placed on high dose steroids in the interim. I don not believe the radiatation therpay is pklaying a large paart in his resp. Failure 8/9 Little change clinically . Patient on 50% FI02. CXR is little changed 2. DVT Left   Plan  Cont Eliquis   3. CHF  8/5 >neg 7 L bal , wt down 16lb since admit  Plan  Cont diuresis 8/8 I don't think there is a strong element of CHF at this point. Patient has diures d > 10 liters  since admission. Heart rate is rreasonably controlled  4. A Fib  On Amiodarone -check ESR  ? amio toxicity .  Heart rate well controlled presently. 8/8 Heart rate remains well controlled            P  Micheal Likens MD  [place]. Pager: [phone]. After hours pager: [phone].  01/21/2018, 10:38 AM

## 2018-01-21 NOTE — Progress Notes (Signed)
Inpatient Diabetes Program Recommendations  AACE/ADA: New Consensus Statement on Inpatient Glycemic Control (2019)  Target Ranges:  Prepandial:   less than 140 mg/dL      Peak postprandial:   less than 180 mg/dL (1-2 hours)      Critically ill patients:  140 - 180 mg/dL  Results for REINER, LOEWEN (MRN [id number]) as of 01/21/2018 07:25  Ref. Range 01/21/2018 03:40  Glucose Latest Ref Range: 70 - 99 mg/dL 222 (H)   Results for JES, COSTALES (MRN [id number]) as of 01/21/2018 07:25  Ref. Range 01/20/2018 08:04 01/20/2018 12:27 01/20/2018 17:07 01/20/2018 22:16  Glucose-Capillary Latest Ref Range: 70 - 99 mg/dL 242 (H) 394 (H) 404 (H) 339 (H)   Review of Glycemic Control  Diabetes history: DM2 Outpatient Diabetes medications: Tresiba 85 units daily Current orders for Inpatient glycemic control: Lantus 10 units daily, Novolog 6 units TID with meals, Novolog 0-20 units TID with meals; Solumedrol 125 mg Q6H  Inpatient Diabetes Program Recommendations:  Insulin - Basal: Patient received a total of Lantus 47 units on 01/20/18 (dose of 10 units and then 37 units). If steroids are continued, please consider changing Lantus order to 50 units daily (to start this morning). Correction (SSI): Please add Novolog 0-5 units QHS for bedtime correction scale. Insulin - Meal Coverage: Noted Novolog 6 units TID with meals was ordered yesterday evening.  Thanks, Barnie Alderman, RN, MSN, CDE Diabetes Coordinator Inpatient Diabetes Program [phone] (Team Pager from 8am to 5pm)

## 2018-01-21 NOTE — Progress Notes (Signed)
Physical Therapy Treatment Patient Details Name: Justin Blackburn. MRN: [id number] DOB: [birth year] Today's Date: 01/21/2018    History of Present Illness Pt is a 67 y.o. male with PMH of CAD (s/p CABG, stents), HTN, esophageal adenocarcinoma (stage T3) s/p chemo/radiation, PAD, a-flutter/a-fib, DM2, and recent LLE DVT (12/07/17), admitted 01/14/18 after fall. Worked up for CHF exacerbation and hypoxemia. Pt with worsening respiratory status on 8/5 requiring continuous BiPAP and transfer to ICU.     PT Comments    Pt continutes to be severally limited by cough and desatting during therapy today. Decreased level of assistance for standing and transfers, however with decreased activity tolerence and unable to safely progress to ambulation at this time. See vitals below. Will cont to follow, agree with CIR recs pending patients progress.     SpO2  92% 30L 50% FiO2 at rest 66% 30L 50% FiO2 with standing, turned up to 80% FiO2 cued for pursed lip breathing returns to 90% in 10 minutes.       Follow Up Recommendations  CIR;Supervision/Assistance - 24 hour(pending progress)     Equipment Recommendations  None recommended by PT    Recommendations for Other Services       Precautions / Restrictions Precautions Precautions: Fall Precaution Comments: watch SpO2; currently on High Flow 25L, 80% FiO2 Restrictions Weight Bearing Restrictions: No    Mobility  Bed Mobility Overal bed mobility: Needs Assistance Bed Mobility: Supine to Sit     Supine to sit: Supervision        Transfers Overall transfer level: Needs assistance Equipment used: Rolling walker (2 wheeled) Transfers: Sit to/from Stand Sit to Stand: Min assist Stand pivot transfers: Min guard       General transfer comment: Pt standing with min A, then min guard for safety. fatigues quickly on his feet, lasting 2 minutes before transfering to bed.   Ambulation/Gait             General Gait Details: defered due  to desatting    Stairs             Wheelchair Mobility    Modified Rankin (Stroke Patients Only)       Balance Overall balance assessment: Needs assistance Sitting-balance support: Feet supported Sitting balance-Leahy Scale: Fair     Standing balance support: Bilateral upper extremity supported Standing balance-Leahy Scale: Poor                              Cognition Arousal/Alertness: Awake/alert Behavior During Therapy: WFL for tasks assessed/performed Overall Cognitive Status: Within Functional Limits for tasks assessed                                        Exercises General Exercises - Lower Extremity Long Arc Quad: Both;20 reps Hip ABduction/ADduction: Both;10 reps    General Comments        Pertinent Vitals/Pain Pain Assessment: No/denies pain    Home Living                      Prior Function            PT Goals (current goals can now be found in the care plan section) Acute Rehab PT Goals 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 : A Little Difficulty sitting down on and standing up from a chair with arms (e.g., wheelchair, bedside commode, etc,.)?: A Little Help needed moving to and from a bed to chair (including a wheelchair)?: A Little Help needed walking in hospital room?: A Lot Help needed climbing 3-5 steps with a railing? : Total 6 Click Score: 15    End of Session Equipment  Utilized During Treatment: Gait belt;Oxygen Activity Tolerance: Patient tolerated treatment well Patient left: in bed;with call bell/phone within reach;with bed alarm set;with family/visitor present Nurse Communication: Mobility status PT Visit Diagnosis: Other abnormalities of gait and mobility (R26.89);Muscle weakness (generalized) (M62.81)     Time: 8590-9311 PT Time Calculation (min) (ACUTE ONLY): 25 min  Charges:  $Therapeutic Activity: 8-22 mins                     Reinaldo Berber, PT, DPT Acute Rehab Services Pager: [phone]     Reinaldo Berber 01/21/2018, 11:22 AM

## 2018-01-21 NOTE — Plan of Care (Signed)
Patient continues to progress slowly.  Remains on HFNC 25L/[place], warmed w/sats ranging in mid 80s-mid 90s.  Patient desats w/activity but is able to recover slowly without respiratory distress.  Continue weaning as able.  Patient up to bedside chair w/only contact guard assistance x one.

## 2018-01-22 LAB — GLUCOSE, CAPILLARY
GLUCOSE-CAPILLARY: 226 mg/dL — AB (ref 70–99)
Glucose-Capillary: 173 mg/dL — ABNORMAL HIGH (ref 70–99)
Glucose-Capillary: 234 mg/dL — ABNORMAL HIGH (ref 70–99)
Glucose-Capillary: 210 mg/dL — ABNORMAL HIGH (ref 70–99)

## 2018-01-22 MED ORDER — PREDNISONE 20 MG PO TABS: 40.0000 mg | ORAL_TABLET | Freq: Every day | ORAL | Status: DC

## 2018-01-22 MED ORDER — PREDNISONE 50 MG PO TABS
60.0000 mg | ORAL_TABLET | Freq: Every day | ORAL | Status: DC
  Administered 2018-01-23 – 2018-01-25 (×3): 60 mg via ORAL
  Filled 2018-01-22 (×3): qty 1

## 2018-01-22 NOTE — Progress Notes (Signed)
Progress Note  Patient Name: Justin Blackburn. Date of Encounter: 01/22/2018  Primary Cardiologist: [name], MD   Subjective   Dyspnea improving; no chest pain  Inpatient Medications    Scheduled Meds: . apixaban  5 mg Oral BID  . atorvastatin  40 mg Oral q1800  . benzonatate  200 mg Oral TID  . carvedilol  3.125 mg Oral BID WC  . chlorhexidine  15 mL Mouth Rinse BID  . clopidogrel  75 mg Oral Daily  . furosemide  40 mg Oral BID  . gabapentin  300 mg Oral BID  . insulin aspart  0-20 Units Subcutaneous TID WC  . insulin aspart  10 Units Subcutaneous TID WC  . insulin glargine  20 Units Subcutaneous Daily  . mouth rinse  15 mL Mouth Rinse q12n4p  . multivitamin with minerals  1 tablet Oral Daily  . pantoprazole  40 mg Oral Daily  . potassium chloride  40 mEq Oral BID  . [START ON 01/23/2018] predniSONE  60 mg Oral Q breakfast  . sucralfate  1 g Oral TID WC & HS   Continuous Infusions:  PRN Meds: acetaminophen **OR** acetaminophen, Gerhardt's butt cream, polyethylene glycol   Vital Signs    Vitals:   01/21/18 2341 01/22/18 0437 01/22/18 0747 01/22/18 0834  BP:  140/79 117/67 117/67  Pulse:  70 (!) 54 75  Resp: 14 (!) 21 (!) 25   Temp:  97.7 F (36.5 C) 97.9 F (36.6 C)   TempSrc:  Oral Oral   SpO2:  93% 91%   Weight:  86.3 kg    Height:        Intake/Output Summary (Last 24 hours) at 01/22/2018 1136 Last data filed at 01/22/2018 1121 Gross per 24 hour  Intake 400 ml  Output 1450 ml  Net -1050 ml   Filed Weights   01/20/18 0419 01/21/18 0526 01/22/18 0437  Weight: 86.8 kg 88.1 kg 86.3 kg    Telemetry    Sinus with pacs, pvcs and occasional couplet- Personally Reviewed   Physical Exam   GEN: No acute distress.   Neck: supple Cardiac: irregular Respiratory: Crackles bases GI: Soft, nontender, non-distended  MS: 1 + edema Neuro:  Nonfocal  Psych: Normal affect   Labs    Chemistry Recent Labs  Lab 01/19/18 0230 01/20/18 0234  01/20/18 2043 01/21/18 0340  NA 133* 137  --  138  K 3.2* 3.6  --  4.2  CL 93* 97*  --  99  CO2 29 32  --  30  GLUCOSE 455* 288* 369* 222*  BUN 35* 32*  --  30*  CREATININE 1.24 0.96  --  0.95  CALCIUM 7.6* 8.0*  --  8.1*  GFRNONAA 58* >60  --  >60  GFRAA >60 >60  --  >60  ANIONGAP 11 8  --  9     Hematology Recent Labs  Lab 01/18/18 0337 01/19/18 0230 01/21/18 0340  WBC 5.6 8.3 11.9*  RBC 4.47 3.92* 4.01*  HGB 10.5* 9.3* 9.4*  HCT 34.0* 30.6* 31.2*  MCV 76.1* 78.1 77.8*  MCH 23.5* 23.7* 23.4*  MCHC 30.9 30.4 30.1  RDW 23.4* 23.2* 23.0*  PLT 323 333 321    Cardiac Enzymes Recent Labs  Lab 01/17/18 1616  TROPONINI 0.04*    Patient Profile     67 y.o. male with past medical history of combined systolic/diastolic congestive heart failure, PAF, coronary artery disease, esophageal cancer admitted with respiratory  failure and acute on chronic combined systolic/diastolic congestive heart failure.  Assessment & Plan    1 acute on chronic combined systolic/diastolic congestive heart failure-patient has diuresed 12 L since admission.  However he appears to remain mildly volume overloaded.  Continue Lasix 40 mg twice daily.  Follow renal function.  2 paroxysmal atrial fibrillation-patient remains in sinus rhythm today.  Continue carvedilol and apixaban.  Amiodarone discontinued previously.  3 history of DVT-continue apixaban.  For questions or updates, please contact [place] Please consult [email or web address] for contact info under Cardiology/STEMI.      Signed, Kirk Ruths, MD  01/22/2018, 11:36 AM

## 2018-01-22 NOTE — Progress Notes (Signed)
Name: Justin Blackburn. MRN: [id number] DOB: Mar 19, 1951    ADMISSION DATE:  01/14/2018 CONSULTATION DATE:  8/4  REFERRING MD :  [facility] /Dr. Virgina Jock   CHIEF COMPLAINT:  Hypoxia   BRIEF PATIENT DESCRIPTION: 67 year old male with minimal smoking history admitted August 2 with decompensated congestive heart failure.  Patient has esophageal cancer status post recent chemo and XRT.  Patient with progressive hypoxemia despite diuresis.  Pulmonary consulted August 4  8/7 The patient is in the stepdown unit. He appears a little more comfortable He has a persistent dry cough. He iis on HFNC at about 15 lpm. His color is better. His appetite is very good ( perhaps inpart to steroids) 8/8 The patient appears reasonbly comfortble today He is on 25 lppm high flow 02. He is maintaining his 02 sats at rest. When he moves however, his 02 sat still plummets. He does nt have a productive cough. Has been afebbrile. 8/9 The patent is sitting up in a chair. He says he feels a bit better. His appetite is good. I spoke to IM and we have agreed to take his steroid dose down at this time. It is palying  [place] with his blood glucose,       STUDIES:  2D echo 8/2> EF 45 to 50%, moderate dilation of right ventricle, PAP 44  Prior to admit--- Venous Doppler December 07, 2017 acute DVT left leg common femoral vein, femoral, popliteal, posterior tibial, peroneal, left external iliac.  PET scan 4/4> mild to moderate hyper metabolic and distal esophagus.  No findings for pulmonary or hepatic metastatic disease.  No adenopathy.  VQ scan 6/25 normal, no evidence of PE  CT chest with contrast 7/25 no evidence for metastatic adenopathy.  Small bilateral pleural effusions with interstitial and pulmonary edema.  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  Patient has a history of coronary artery disease status post CABG in 1998 and subsequent redo in 2007.  Stents placed in 2013.  And March 2019.  Plavix started in March 2019.  Since radiation patient has significant difficulty eating with weight loss.  As of recent says that that has improved some.  He has no nausea or vomiting. Was diagnosed with a extensive DVT on the left leg December 07, 2017 started on Eliquis.  During this timeframe he started having increased shortness of breath.  A VQ scan was done that was normal with no evidence of PE.  Patient had a follow-up CT chest January 06, 2018 that showed no evidence of metastatic adenopathy.  There was small bilateral pleural effusions with interstitial and pulmonary edema that appeared to be somewhat new.  Along with radiation effects of the along the distal esophagus.  Patient says the night before current admission that he slipped off the bed with no apparent perceived injury.  However the next morning patient became more more short of breath.  He was transferred via EMS from home to the emergency room.  Felt to have decompensated congestive heart failure.  Previous echo showed EF 45 to 50%.  Echo was repeated with no significant change this admission patient has been diuresed since admission with a -5 L removed. White count is slightly elevated.  And he has developed some low-grade temps.  Lactic acid 1.7 on admission.  Chest x-ray yesterday showed bilateral opacities concerning for  edema.  Oxygen demands have increased progressively and now patient is requiring a nonrebreather to keep O2 saturations at 90%.  On arrival patient is in the bed alert and oriented follows commands.  And answers questions appropriately.  O2 saturations are 89 to 90% on 100% nonrebreather mask.  Wife is at bedside been updated     VITAL SIGNS: Temp:  [97.4 F (36.3 C)-97.9 F (36.6 C)] 97.9 F (36.6 C) (08/10 0747) Pulse Rate:  [54-79] 75 (08/10 0834) Resp:  [6-25]  25 (08/10 0747) BP: (105-140)/(67-90) 117/67 (08/10 0834) SpO2:  [91 %-100 %] 91 % (08/10 0747) FiO2 (%):  [30 %-40 %] 30 % (08/09 1647) Weight:  [86.3 kg] 86.3 kg (08/10 0437)  PHYSICAL EXAMINATION: General: chronically ill appearing on HFNC Neuro: Alert and interactive, moving all ext to command HEENT: [place]/AT, PERRL, EOM-I and MMM Cardiovascular: IRIR, Nl S1/S2 and -M/R/G Lungs: Diffuse crackles Abdomen: Soft, NT, ND and +BS Musculoskeletal: intact  Skin: no rash, -edema and -tenderness  Recent Labs  Lab 01/19/18 0230 01/20/18 0234 01/20/18 2043 01/21/18 0340  NA 133* 137  --  138  K 3.2* 3.6  --  4.2  CL 93* 97*  --  99  CO2 29 32  --  30  BUN 35* 32*  --  30*  CREATININE 1.24 0.96  --  0.95  GLUCOSE 455* 288* 369* 222*   Recent Labs  Lab 01/18/18 0337 01/19/18 0230 01/21/18 0340  HGB 10.5* 9.3* 9.4*  HCT 34.0* 30.6* 31.2*  WBC 5.6 8.3 11.9*  PLT 323 333 321   No results found. I reviewed CXR myself, pulmonary infiltrate noted.  ASSESSMENT / PLAN: 1. Acute Hypoxic respiratory failure-in the setting of decompensated congestive heart failure.  Patient is immunosuppressed with low-grade temp and mildly elevated white blood cell count.  Suspect has secondary PNA  Doubt PE as recent VQ and CT w/ contrast neg . Compliant w/ Eliquis .  Concern with recent chemo , unlikely a  pneumonitis or aspiration as finished chemo several weeks ago and swallowing was improving .   8/8 The p[atient developed worsening hypoxemia and respiratory distress a few days ago. He remains on broad spectrum abx therapy with cefpime and Azithromycin. I have ordered a repeat CXR. We were concerned about the possibility of amiodarone toxicity as the patient has been taking it the last two months at a dose of '400mg'$  a day. The Amiodarone was stopped and the patient placed on high dose steroids in the interim. I don not believe the radiatation therpay is pklaying a large paart in his resp.  Failure 8/9 Little change clinically . Patient on 50% FI02. CXR is little changed 8/10 He is on 3 lpm nasal cannula and amintaining hsi 02 sats which suggests ongoing clinical improvement. His steroids have been weaned down. It may be reasonable to repat his CT scan in the next week for camparison    2. DVT Left   Plan  Cont Eliquis   3. CHF  8/5 >neg 7 L bal , wt down 16lb since admit  Plan  Cont diuresis 8/8 I don't think there is a strong element of CHF at this point. Patient has diures d > 10 liters since admission. Heart rate is rreasonably controlled 8/10 The patient is on lasix '40mg'$  bid. His net output the last 48 hours is about 2200 cc.   4. A Fib  On Amiodarone -check ESR  ? amio toxicity .  Heart rate well controlled presently.  8/8 Heart rate remains well controlled 8/10 The patient's heart rate appears well controlled. He does have a fair amount of ventricular ectopy however.            Harper MD  [facility]. Pager: [phone]. After hours pager: [phone].  01/22/2018, 10:54 AM

## 2018-01-22 NOTE — Progress Notes (Signed)
CCMD called about 21 beat run SVT. Cards notified.   [place]  Aurilla Coulibaly, RN

## 2018-01-22 NOTE — Progress Notes (Signed)
Subjective: No acute events overnight. Justin Blackburn continues to look well and is without concerns today. He is now on 3L [place] with saturations > 90%. We have stopped all antibiotics. He remains afebrile. We discussed switching IV steroids to po and have started thinking about discharge and rehab.    Objective:  Vital signs in last 24 hours: Vitals:   01/21/18 2341 01/22/18 0437 01/22/18 0747 01/22/18 0834  BP:  140/79 117/67 117/67  Pulse:  70 (!) 54 75  Resp: 14 (!) 21 (!) 25   Temp:  97.7 F (36.5 C) 97.9 F (36.6 C)   TempSrc:  Oral Oral   SpO2:  93% 91%   Weight:  86.3 kg    Height:       General:no acute distress, good color Cardiac: irregular rhythm, tachycardic, no murmurs, no peripheral edema, no JVD Pulm: dry, coarse crackles in bilateral bases GI: abdomen is soft, non tender, non distended  Ext: Left leg with 1+ pitting edema. No edema on right leg Skin: there is a small red erythematous at the top of the gluteal cleft without skin breakdown   Assessment/Plan:  Justin Blackburn is a 67 yo male with history ofchronic combinedCHF secondary to ischemic cardiomyopathywithDESto the LADand LCx in Feb 2019 and s/p CABG x2, esophogeal adenocarcinoma s/p chemoradiation, afib/flutter who presented with hypoxemia to 66% on RA and was admitted for volume overload and hypoxic respiratory failure.  #Acute hypoxic respiratory failure:  Weaned off high flow. Now on 3L [place]. Turned down to 1-2L [place] during my exam and patient desatted to 86%. Will keep 3L [place] for now. Differential includes amiodarone pulmonary toxicity vs. Radiation pneumonitis or radiation induced scaring.  - CXR 8/8 largely unchanged from prior - wean O2 as tolerated - Lasix 40mg  po BID. If this is ineffective. Consider switching to Torsemide 40mg  BID - s/p Azithromycin (8/5 - 8/9)  - s/p Cefepime (8/4 - 8/8)  - discontinue Solu-Medrol 125mg  IV q12h - START po prednisone  - Continue holding amiodarone. Respiratory status  seemed to improve with discontinuation of amiodarone and initiation of high dose steroids. Although, he was only taking Amiodarone since December 07, 2017 and cardiology does not feel this is long enough to cause pulmonary toxicity. Regardless, steroids are the treatment for radiation pneumonitis and amiodarone toxicity.  - BCx no growth at 5 days - If fevers, add back azithromycin and vancomycin and redraw blood cultures  #Acute on chronic combined systolic and diastolic congestive heart failure secondary to ischemic cardiomyopathy  #Acute pulmonary edema ([place]): Diuresis has stabilized. BUN and creatine remain improved today after stopping IV lasix.  - continue po lasix 40mg  BID - Replete K+ and Mg+ as needed - continue home coreg, plavix, and statin   -not onACE/ ARBdue to orthostatic hypotension in the past, we should resume this at a very low dose when he is stable - cardiology is on board, we appreciate their assistance in the care of this patient.   #Type 2 NSTEMI - troponin peaked at 0.10  - type 2 and secondary to demand ischemia from volume overload  - remains chest pain free   #Atrial fibrillation, paroxysmal (HCC) - irregular rhythm today. Monitor showing frequent PVCs and ectopic beats - repeat EKG  - Discontinued amiodarone due to possibility of pulmonary toxicity - Continue carvedilol 3.125 twice daily for rate control -Continue Eliquis for stroke prophylaxis and DVT as noted below -Continue electrolyte and telemetry monitoring  #Microcytic anemia  - hemoglobin remains stable  at 9-10 - continue daily iron supplementation  #History of DVT (deep vein thrombosis) - continue eliquis   #Diabetes mellitus type 2, insulin dependent ([place]): glucose increased with IV steroids.  - Blood glucose 173-278 over the past 24 hours. Increased with IV steroids - lantus increased to 20 units today  - Novolog 10 units with meals  - Continue blood glucose monitoring and  SSI  #Pressure injury of skin: Stage 1 Sacrum   Malnutrition of moderate degree -Dietitian following, we appreciate their assistance in the care of this patient  Dispo: Anticipated discharge in approximately 3 day(s).   Francesco Runner, MD 01/22/2018, 9:49 AM HRVAC:[phone]

## 2018-01-22 NOTE — Progress Notes (Signed)
Medicine attending: I examined this patient today together with resident physician Dr Francesco Runner and I concur with her evaluation and management plan. His oxygenation continues to improve and he is now down to 3 L nasal cannula oxygen with oxygen saturation greater than or equal to 91%.  Persistent intermittent nonproductive cough.  Limiting physical therapy.  Decreased fine rales are of the lungs bilaterally compared with exam yesterday.  Heart rate irregular.  Monitor shows frequent PVCs and suspicion that he is back in atrial fibrillation.  We will get a 12-lead echocardiogram to confirm.*  Heart rate at rest less than 100. He has completed a planned course of empiric antibiotics. Good urine output.  -12+ liters since admission. Renal function stable. Glucose improved on decreased steroid dose. Plan is to transition to oral steroids. Mobilize patient.  Physical therapy as tolerated. We discussed with wife eventual plans for either inpatient rehab or short-term rehab facility close to their home in [place]. *EKG this morning shows he is still in sinus rhythm with frequent PACs.

## 2018-01-23 LAB — MAGNESIUM: MAGNESIUM: 1.9 mg/dL (ref 1.7–2.4)

## 2018-01-23 LAB — BASIC METABOLIC PANEL
ANION GAP: 7 (ref 5–15)
BUN: 32 mg/dL — AB (ref 8–23)
CALCIUM: 8.1 mg/dL — AB (ref 8.9–10.3)
CO2: 32 mmol/L (ref 22–32)
Chloride: 98 mmol/L (ref 98–111)
Creatinine, Ser: 0.94 mg/dL (ref 0.61–1.24)
GFR calc Af Amer: >60 mL/min (ref >60)
GFR calc non Af Amer: >60 mL/min (ref >60)
Glucose, Bld: 165 mg/dL — ABNORMAL HIGH (ref 70–99)
Potassium: 4 mmol/L (ref 3.5–5.1)
Sodium: 137 mmol/L (ref 135–145)

## 2018-01-23 LAB — GLUCOSE, CAPILLARY
GLUCOSE-CAPILLARY: 164 mg/dL — AB (ref 70–99)
GLUCOSE-CAPILLARY: 231 mg/dL — AB (ref 70–99)
Glucose-Capillary: 125 mg/dL — ABNORMAL HIGH (ref 70–99)
Glucose-Capillary: 181 mg/dL — ABNORMAL HIGH (ref 70–99)

## 2018-01-23 MED ORDER — MAGNESIUM SULFATE IN D5W 1-5 GM/100ML-% IV SOLN
1.0000 g | Freq: Once | INTRAVENOUS | Status: AC
  Administered 2018-01-23: 1 g via INTRAVENOUS
  Filled 2018-01-23: qty 100

## 2018-01-23 NOTE — Progress Notes (Signed)
Subjective: 21 beat run of SVT overnight, he denies shortness of breath, chest pain, or palpitations at the time. He did feel some bilateral shoulder pain which is typical for him when he experiences anxiety. He denies new concerns or complaints.   Objective:  Vital signs in last 24 hours: Vitals:   01/22/18 2346 01/23/18 0414 01/23/18 0739 01/23/18 0858  BP: 131/63 127/66 140/73   Pulse: 63 (!) 59 (!) 57 83  Resp: (!) 24 12 (!) 24   Temp: 97.8 F (36.6 C) (!) 97.5 F (36.4 C) 97.8 F (36.6 C)   TempSrc: Oral Oral Oral   SpO2: 95% 97% 100%   Weight:  85.4 kg    Height:       General: chronically ill appearing, no acute distress  Cardiac: regular rate and rhythm, no murmurs appreciated, 2+ lower extremity edema right >left, no calf tenderness  Pulm: fine bibasilar rhonchi GI: abdomen soft, non tender, non distended   Assessment/Plan:  Justin Blackburn is a 67 yo male with history ofchronic combinedCHF secondary to ischemic cardiomyopathywithDESto the LADand LCx in Feb 2019 and s/p CABG x2, esophogeal adenocarcinoma s/p chemoradiation, afib/flutter who presented with hypoxemia to 66% on RA and was admitted for volume overload and hypoxic respiratory failure.  #Acute hypoxic respiratory failure: [name] was set at 3lpm, weaned down to 2 lpm while we were in the room talking and sats maintained > 88%. Differential includes amiodarone pulmonary toxicity vs. Radiation pneumonitis or radiation induced scaring.  - wean O2 as tolerated -Lasix 40mg  po BID. If this is ineffective. Consider switching to Torsemide 40mg  BID - s/p Azithromycin (8/5 - 8/9) s/p Cefepime (8/4 - 8/8)  - continue po prednisone  - Continue holding amiodarone. Respiratory status seemed to improve with discontinuation of amiodarone and initiation of high dose steroids. Although, he was only taking Amiodarone since December 07, 2017 and cardiology does not feel this is long enough to cause pulmonary toxicity. Regardless,  steroids are the treatment for radiation pneumonitis and amiodarone toxicity.  - BCx no growth at5days - If fevers, add back azithromycin and vancomycin and redraw blood cultures  #Acute on chronic combined systolic and diastolic congestive heart failure secondary to ischemic cardiomyopathy #Acute pulmonary edema ([name]): Diuresis has stabilized.BUN and creatine remain improved today after stopping IV lasix.  - continue po lasix 40mg  BID - Replete K+ and Mg+ as needed - continue home coreg, plavix, and statin  -not onACE/ ARBdue to orthostatic hypotension in the past, we should resume this at a very low dose when he is stable - cardiology is on board, we appreciate their assistance in the care of this patient.   #Type 2 NSTEMI - troponin peaked at 0.10  - type 2 and secondary to demand ischemia from volume overload  - remains chest pain free   #Atrial fibrillation, paroxysmal (HCC) - sinus rhythm with ectopic beats today  - Monitor showing frequent PVCs and ectopic beats overnight cardiology was consulted regarding 21 beat run of SVT. He denies feeling any palpitations, chest pain, or worsening of the shortness of breath overnight. He did feel shoulder pain which is something he feels when he experiences anxiety at home.   - EKG yesterday confirmed sinus rhythm with ectopic atrial beats. T waves in lateral leads were flattened when compared to 8/5 T waves are no longer inverted  - Discontinued amiodarone due to possibility of pulmonary toxicity - Continue carvedilol 3.125 twice daily for rate control -Continue Eliquis for stroke prophylaxis and  DVT as noted below -Continue electrolyte and telemetry monitoring  #Microcytic anemia  - hemoglobin remains stable at 9-10 - continue daily iron supplementation  #History of DVT (deep vein thrombosis) - continue eliquis   #Diabetes mellitus type 2, insulin dependent ([name]): glucose increased with IV steroids.  - Blood glucose  173-278over the past 24 hours. Increased with IV steroids - lantus increased to 20 units today  - Novolog 10 units with meals  - Continue blood glucose monitoring and SSI  #Pressure injury of skin: Stage 1 Sacrum Malnutrition of moderate degree -Dietitian following, we appreciate their assistance in the care of this patient  Dispo: Anticipated discharge in approximately 1-2 day(s) pending SNF placement.   Justin Noss, MD 01/23/2018, 11:51 AM Pager: [phone]

## 2018-01-23 NOTE — Progress Notes (Signed)
Notified by telemetry tech that pt had a 9 beat run of Vtach. Pt is asymptomatic at this time. Denies pain or complications. Hinton Dyer NP notified of same. No new orders at this time.

## 2018-01-23 NOTE — Progress Notes (Signed)
Medicine attending: Clinical status and ancillary data reviewed with resident physician Dr. Ledell Noss and I concur with her evaluation and management plan which we discussed together. He continues to improve.  FiO2 taper down to 2-3 L/min with oxygen saturations consistently at or above 88%. Transition to oral medications yesterday.  Lasix at 40 mg twice daily.  Prednisone 60 mg daily.  Blood sugars currently under better control as steroids tapered. Discharge planning in progress.  Will likely need skilled nursing facility for short-term reconditioning.

## 2018-01-23 NOTE — Progress Notes (Signed)
Progress Note  Patient Name: Justin Blackburn. Date of Encounter: 01/23/2018  Primary Cardiologist: [name], MD   Subjective   Denies dyspnea or chest pain  Inpatient Medications    Scheduled Meds: . apixaban  5 mg Oral BID  . atorvastatin  40 mg Oral q1800  . benzonatate  200 mg Oral TID  . carvedilol  3.125 mg Oral BID WC  . chlorhexidine  15 mL Mouth Rinse BID  . clopidogrel  75 mg Oral Daily  . furosemide  40 mg Oral BID  . gabapentin  300 mg Oral BID  . insulin aspart  0-20 Units Subcutaneous TID WC  . insulin aspart  10 Units Subcutaneous TID WC  . insulin glargine  20 Units Subcutaneous Daily  . mouth rinse  15 mL Mouth Rinse q12n4p  . multivitamin with minerals  1 tablet Oral Daily  . pantoprazole  40 mg Oral Daily  . potassium chloride  40 mEq Oral BID  . predniSONE  60 mg Oral Q breakfast  . sucralfate  1 g Oral TID WC & HS   Continuous Infusions:  PRN Meds: acetaminophen **OR** acetaminophen, Gerhardt's butt cream, polyethylene glycol   Vital Signs    Vitals:   01/22/18 2032 01/22/18 2346 01/23/18 0414 01/23/18 0739  BP:  131/63 127/66 140/73  Pulse: 77 63 (!) 59 (!) 57  Resp: (!) 27 (!) 24 12 (!) 24  Temp:  97.8 F (36.6 C) (!) 97.5 F (36.4 C) 97.8 F (36.6 C)  TempSrc:  Oral Oral Oral  SpO2: 97% 95% 97% 100%  Weight:   85.4 kg   Height:        Intake/Output Summary (Last 24 hours) at 01/23/2018 0838 Last data filed at 01/23/2018 0414 Gross per 24 hour  Intake 960 ml  Output 2025 ml  Net -1065 ml   Filed Weights   01/21/18 0526 01/22/18 0437 01/23/18 0414  Weight: 88.1 kg 86.3 kg 85.4 kg    Telemetry    Sinus with pacs, pvcs and occasional couplet; 4 beats NSVT- Personally Reviewed   Physical Exam   GEN: WD chronically ill appearing, No acute distress.   Neck: supple; no JVD Cardiac: RRR Respiratory: Crackles bases unchanged GI: Soft, NT/ND MS: 1 + edema LLE Neuro:  grossly intact    Labs    Chemistry Recent  Labs  Lab 01/20/18 0234 01/20/18 2043 01/21/18 0340 01/23/18 0249  NA 137  --  138 137  K 3.6  --  4.2 4.0  CL 97*  --  99 98  CO2 32  --  30 32  GLUCOSE 288* 369* 222* 165*  BUN 32*  --  30* 32*  CREATININE 0.96  --  0.95 0.94  CALCIUM 8.0*  --  8.1* 8.1*  GFRNONAA >60  --  >60 >60  GFRAA >60  --  >60 >60  ANIONGAP 8  --  9 7     Hematology Recent Labs  Lab 01/18/18 0337 01/19/18 0230 01/21/18 0340  WBC 5.6 8.3 11.9*  RBC 4.47 3.92* 4.01*  HGB 10.5* 9.3* 9.4*  HCT 34.0* 30.6* 31.2*  MCV 76.1* 78.1 77.8*  MCH 23.5* 23.7* 23.4*  MCHC 30.9 30.4 30.1  RDW 23.4* 23.2* 23.0*  PLT 323 333 321    Cardiac Enzymes Recent Labs  Lab 01/17/18 1616  TROPONINI 0.04*    Patient Profile     67 y.o. male with past medical history of combined systolic/diastolic congestive heart  failure, PAF, coronary artery disease, esophageal cancer admitted with respiratory failure and acute on chronic combined systolic/diastolic congestive heart failure.  Assessment & Plan    1 acute on chronic combined systolic/diastolic congestive heart failure-patient has diuresed 13.1 liters since admission.  Continue present dose of Lasix.  Follow potassium and renal function.  2 paroxysmal atrial fibrillation-patient in sinus rhythm this morning.  Continue carvedilol and amiodarone.  3 history of DVT-continue apixaban.  For questions or updates, please contact [place] Please consult [email or web address] for contact info under Cardiology/STEMI.      Signed, Kirk Ruths, MD  01/23/2018, 8:38 AM

## 2018-01-24 DIAGNOSIS — I440 Atrioventricular block, first degree: Secondary | ICD-10-CM

## 2018-01-24 DIAGNOSIS — I480 Paroxysmal atrial fibrillation: Secondary | ICD-10-CM

## 2018-01-24 LAB — GLUCOSE, CAPILLARY
GLUCOSE-CAPILLARY: 141 mg/dL — AB (ref 70–99)
GLUCOSE-CAPILLARY: 248 mg/dL — AB (ref 70–99)
Glucose-Capillary: 281 mg/dL — ABNORMAL HIGH (ref 70–99)
Glucose-Capillary: 174 mg/dL — ABNORMAL HIGH (ref 70–99)
Glucose-Capillary: 140 mg/dL — ABNORMAL HIGH (ref 70–99)

## 2018-01-24 LAB — BASIC METABOLIC PANEL
Anion gap: 9 (ref 5–15)
BUN: 30 mg/dL — ABNORMAL HIGH (ref 8–23)
CALCIUM: 7.9 mg/dL — AB (ref 8.9–10.3)
CO2: 30 mmol/L (ref 22–32)
Chloride: 97 mmol/L — ABNORMAL LOW (ref 98–111)
Creatinine, Ser: 0.85 mg/dL (ref 0.61–1.24)
Glucose, Bld: 239 mg/dL — ABNORMAL HIGH (ref 70–99)
POTASSIUM: 4.1 mmol/L (ref 3.5–5.1)
Sodium: 136 mmol/L (ref 135–145)

## 2018-01-24 MED ORDER — INSULIN GLARGINE 100 UNIT/ML ~~LOC~~ SOLN
22.0000 [IU] | Freq: Every day | SUBCUTANEOUS | Status: DC
  Administered 2018-01-25 – 2018-01-27 (×3): 22 [IU] via SUBCUTANEOUS
  Filled 2018-01-24 (×3): qty 0.22

## 2018-01-24 NOTE — Clinical Social Work Note (Signed)
Clinical Social Work Assessment  Patient Details  Name: Justin Blackburn. MRN: [id number] Date of Birth: 03-21-1951  Date of referral:  01/24/18               Reason for consult:  Facility Placement                Permission sought to share information with:  Facility Sport and exercise psychologist, Family Supports Permission granted to share information::     Name::     Justin Blackburn  Agency::  SNFs  Relationship::  spouse  Contact Information:  [phone]  Housing/Transportation Living arrangements for the past 2 months:  Single Family Home Source of Information:  Spouse Patient Interpreter Needed:  None Criminal Activity/Legal Involvement Pertinent to Current Situation/Hospitalization:  No - Comment as needed Significant Relationships:  Spouse Lives with:  Spouse Do you feel safe going back to the place where you live?  Yes Need for family participation in patient care:  Yes (Comment)  Care giving concerns: Patient from home with spouse. PT recommending SNF. Patient weaned down to 2L [place].   Social Worker assessment / plan: CSW spoke to patient's wife over the phone. Wife agreeable to SNF and wants a facility in [place]. CSW sent out initial referrals. Patient will need [employer] authorization prior to admitting to a facility. When patient has bed offers and has chosen facility, will start [employer]. CSW to follow and support with discharge planning.  Employment status:  Retired Astronomer) PT Recommendations:  [place] / Referral to community resources:  [place]  Patient/Family's Response to care: Wife appreciative of care.  Patient/Family's Understanding of and Emotional Response to Diagnosis, Current Treatment, and Prognosis: Family with understanding of patient's conditions and agreeable to SNF.    Emotional Assessment Appearance:  Appears stated age Attitude/Demeanor/Rapport:  Unable to  Assess Affect (typically observed):  Unable to Assess Orientation:  Oriented to Self, Oriented to Place, Oriented to  Time, Oriented to Situation Alcohol / Substance use:  Not Applicable Psych involvement (Current and /or in the community):  No (Comment)  Discharge Needs  Concerns to be addressed:  Care Coordination, Discharge Planning Concerns Readmission within the last 30 days:  No Current discharge risk:  Physical Impairment Barriers to Discharge:  Continued Medical Work up, Ship broker, No SNF bed   Estanislado Emms, LCSW 01/24/2018, 2:20 PM

## 2018-01-24 NOTE — Progress Notes (Signed)
Physical Therapy Treatment Patient Details Name: Justin Blackburn. MRN: [id number] DOB: 27-Jan-1951 Today's Date: 01/24/2018    History of Present Illness Pt is a 67 y.o. male with PMH of CAD (s/p CABG, stents), HTN, esophageal adenocarcinoma (stage T3) s/p chemo/radiation, PAD, a-flutter/a-fib, DM2, and recent LLE DVT (12/07/17), admitted 01/14/18 after fall. Worked up for CHF exacerbation and hypoxemia. Pt with worsening respiratory status on 8/5 requiring continuous BiPAP and transfer to ICU.     PT Comments    PT pleasant and willing to mobilize today. Pt on 2L O2 on arrival with SpO2 98% pt coughing EOB with sat dropping to 84% with increased SpO2 to 8L to recover to 94% and maintain for gait. Pt able to perform 40' of gait with limited activity tolerance on 8L fatigue after gait and drop to 85% the last 10' of gait trial. Pt educated for need for increased supplemental oxygen, increasing activity with nursing and HEP with encouragement to continue throughout the day. Pt with decreased activity tolerance and recommend SNF for increased time to return to home function at this time.   HR 102-114 with 3 spikes to 150 that only lasted a few seconds    Follow Up Recommendations  SNF;Supervision/Assistance - 24 hour     Equipment Recommendations  None recommended by PT    Recommendations for Other Services       Precautions / Restrictions Precautions Precautions: Fall Precaution Comments: watch SpO2; at rest 98% on 2L with coughing dropping to 84% with increase to 8L to maintain 91%    Mobility  Bed Mobility Overal bed mobility: Modified Independent Bed Mobility: Supine to Sit           General bed mobility comments: HOB 20 degrees with pt able to pivot supine to sitting without physical assist  Transfers Overall transfer level: Needs assistance   Transfers: Sit to/from Stand Sit to Stand: Min guard         General transfer comment: cues for hand placement and safety as  well as backing to chair  Ambulation/Gait Ambulation/Gait assistance: Min assist Gait Distance (Feet): 40 Feet Assistive device: Rolling walker (2 wheeled) Gait Pattern/deviations: Step-through pattern;Decreased stride length;Trunk flexed   Gait velocity interpretation: <1.8 ft/sec, indicate of risk for recurrent falls General Gait Details: cues for posture and position in RW. Pt walked 20' with SpO2 92% on 8L with drop to 88 on return walk with cues for breathing technique and sequence. Drop to 85% end of gait with 2 min seated recovery   Stairs             Wheelchair Mobility    Modified Rankin (Stroke Patients Only)       Balance Overall balance assessment: Needs assistance   Sitting balance-Leahy Scale: Good       Standing balance-Leahy Scale: Fair                              Cognition Arousal/Alertness: Awake/alert Behavior During Therapy: WFL for tasks assessed/performed Overall Cognitive Status: Within Functional Limits for tasks assessed                                        Exercises General Exercises - Lower Extremity Long Arc Quad: AROM;15 reps;Seated;Both Hip Flexion/Marching: AROM;15 reps;Seated;Both    General Comments  Pertinent Vitals/Pain Pain Assessment: No/denies pain    Home Living                      Prior Function            PT Goals (current goals can now be found in the care plan section) Progress towards PT goals: Progressing toward goals    Frequency    Min 3X/week      PT Plan Discharge plan needs to be updated    Co-evaluation              AM-PAC PT "6 Clicks" Daily Activity  Outcome Measure  Difficulty turning over in bed (including adjusting bedclothes, sheets and blankets)?: A Little Difficulty moving from lying on back to sitting on the side of the bed? : A Little Difficulty sitting down on and standing up from a chair with arms (e.g., wheelchair,  bedside commode, etc,.)?: A Little Help needed moving to and from a bed to chair (including a wheelchair)?: A Little Help needed walking in hospital room?: A Little Help needed climbing 3-5 steps with a railing? : A Lot 6 Click Score: 17    End of Session Equipment Utilized During Treatment: Gait belt;Oxygen Activity Tolerance: Patient tolerated treatment well Patient left: in chair;with call bell/phone within reach;with family/visitor present Nurse Communication: Mobility status PT Visit Diagnosis: Other abnormalities of gait and mobility (R26.89);Muscle weakness (generalized) (M62.81);Difficulty in walking, not elsewhere classified (R26.2)     Time: 0940-1007 PT Time Calculation (min) (ACUTE ONLY): 27 min  Charges:  $Gait Training: 8-22 mins $Therapeutic Exercise: 8-22 mins                     [address] [place]    Lamarr Lulas 01/24/2018, 10:47 AM

## 2018-01-24 NOTE — Discharge Summary (Signed)
Name: Justin Blackburn. MRN: [id number] DOB: [birth year] 67 y.o. PCP: [name], PA-C  Date of Admission: 01/14/2018  6:50 AM Date of Discharge:  Attending Physician: Annia Belt, MD  Discharge Diagnosis: 1. ARDS, multifactorial. Due to decompensated systolic and diastolic heart failure, question opportunistic pulmonary infection vs amiodarone toxicity   Discharge Medications: Allergies as of 01/27/2018   No Known Allergies     Medication List    STOP taking these medications   amiodarone 200 MG tablet Commonly known as:  PACERONE   candesartan 16 MG tablet Commonly known as:  ATACAND     TAKE these medications   acetaminophen 325 MG tablet Commonly known as:  TYLENOL Take 2 tablets (650 mg total) by mouth every 6 (six) hours as needed for mild pain or headache.   atorvastatin 40 MG tablet Commonly known as:  LIPITOR Take 40 mg by mouth daily at 6 PM.   carvedilol 3.125 MG tablet Commonly known as:  COREG Take 1 tablet (3.125 mg total) by mouth 2 (two) times daily with a meal.   clopidogrel 75 MG tablet Commonly known as:  PLAVIX Take 1 tablet (75 mg total) by mouth daily. Take for 6 months. Plan to stop on August 27th. Check with cardiologist at that time.   diltiazem 120 MG 24 hr capsule Commonly known as:  DILACOR XR Take 120 mg by mouth daily.   ELIQUIS 5 MG Tabs tablet Generic drug:  apixaban Take 5 mg by mouth 2 (two) times daily.   ferrous sulfate 325 (65 FE) MG tablet Take 325 mg by mouth daily.   furosemide 40 MG tablet Commonly known as:  LASIX Take 1 tablet (40 mg total) by mouth 2 (two) times daily.   gabapentin 300 MG capsule Commonly known as:  NEURONTIN Take 300 mg by mouth 2 (two) times daily.   nitroGLYCERIN 0.4 MG SL tablet Commonly known as:  NITROSTAT Place 1 tablet (0.4 mg total) under the tongue every 5 (five) minutes as needed for chest pain (Angina).   pantoprazole 40 MG tablet Commonly known as:  PROTONIX Take 1  tablet (40 mg total) by mouth daily.   predniSONE 10 MG tablet Commonly known as:  DELTASONE Take 4 tablets (40 mg total) by mouth daily with breakfast for 6 days, THEN 2 tablets (20 mg total) daily with breakfast for 7 days, THEN 1 tablet (10 mg total) daily with breakfast for 28 days. Start taking on:  01/27/2018   TRESIBA FLEXTOUCH 100 UNIT/ML Sopn FlexTouch Pen Generic drug:  insulin degludec Inject 85 Units into the skin daily.            Durable Medical Equipment  (From admission, onward)         Start     Ordered   01/27/18 1243  DME Oxygen  Once    Question Answer Comment  Mode or (Route) Nasal cannula   Liters per Minute 5   Frequency Continuous (stationary and portable oxygen unit needed)   Oxygen conserving device No   Oxygen delivery system Gas      01/27/18 1247          Disposition and follow-up:   Justin Blackburn. was discharged from [facility] in Stable condition.  At the hospital follow up visit please address:  1.  Acute hypoxic respiratory failure: Discontinued Amiodarone due to pulmonary toxicity. Discharged with Prednisone taper.  Not on ACEi/ARB due to orthostatic hypotension in the past,  consider resuming this at very low dose if BP is stable.   2.  Labs / imaging needed at time of follow-up: consider repeat chest CT  3.  Pending labs/ test needing follow-up: none  Follow-up Appointments:  Contact information for follow-up providers    [name], PA-C. Schedule an appointment as soon as possible for a visit.   Specialty:  Physician Assistant Why:  Please schedule an appointment with your primary care doctor within 1-2 weeks after hospital discharge Contact information: [place] [address] [phone]        Belva Crome, MD Follow up.   Specialty:  Cardiology Why:  Appointment 8/20  Contact information: [address] [place] [address] [phone]         Collene Gobble, MD Follow up.   Specialty:  Pulmonary Disease Why:  Please go to already scheduled appt on September 10th at 9:15am Contact information: [address] [address] [phone]            Contact information for after-discharge care    Destination    [facility] Preferred SNF .   Service:  Skilled Nursing Contact information: [address] [place] [facility] Course by problem list:  Justin Blackburn is a 67 yo male with history ofchronic combinedCHF secondary to ischemic cardiomyopathywithDESto the LADand LCx in Feb 2019 and s/p CABG x2, esophogeal adenocarcinoma s/p chemoradiation, afib/flutter who presented with hypoxemia to 66% on RA and was admitted for volume overload and hypoxic respiratory failure.  1. Acute hypoxic respiratory failure: 2/2 decompensated CHF vs. pneumonia vs. Amiodarone pulmonary toxicity   Initial oxygen saturation 79%, repeat 66% on room air.  67%.  Arterial blood gas pH 7.45, PCO2 27, PO2 88. WBC 12. Exam with diffuse bilateral rhales and increased asymmetric lower extremity edema. Justin Blackburn had been off his diuretics for 1 month due to weight loss and dehydration in the setting of chemotherapy and this was initially thought to be the main contributing factor to his respiratory failure. CXR showed diffuse bilateral pulmonary infiltrates most consistent with pulmonary edema and BNP was 781. Physical exam with lower extremity edema, JVD, and bilateral pulmonary crackles. He required BiPAP initially and was weaned to nonrebreather. However, after diuresing 7-9L, he continued to require a significant amount of supplemental oxygen via nonrebreather and then high flow nasal cannula. Cardiology and Pulmonology were consulted. There was concern for infection given low-grade fever and leukocytosis (10.6) in the setting of recent chemotherapy. He was treated empircally  with Vanco (8/4 - 8/5) and Cefepime (8/4 - 8/8). Vanco was switched to Azithromycin (8/5 - 8/9) for atypical coverage given Justin Blackburn immunocompromised status. Blood cultures and legionella antigen were negative. Bronchoscopy was not recommended due to Justin Blackburn degree of hypoxia. Justin Blackburn had a brief episode of unconsciousness when he desatted to 70s on nonrebreather, BP 103/65. He was placed on BiPAP and quickly regained consciousness. CT chest showed interstitial edema, small bilateral pleural effusions, and additional patchy/ground glass bilateral lower lobe opacities with air bronchograms concerning for superimposed pneumonia. This was reviewed with the radiologist and Amiodarone toxicity was put on the differential. (Justin Blackburn was loaded with Amiodarone in June and then put on 200mg  qd maintenance dose). After discussion with Cardiology, Justin Blackburn was transferred to the ICU for high dose IV steroids and  Amiodarone was discontinued. Dramatic improvement was noted after one days of IV steroids. His color looked better and he was breathing more comfortably, albeit still requiring high flow nasal cannula. He was transferred back to the floor and continued on IV steroids for 3 days before transitioning to po. Justin Blackburn oxygen requirement began to improve and on discharge, he was needed 3L [place] to maintain sats >90% at rest and [facility] with exertion. He was discharged to home with home health nursing, PT/OT, and social work.  2. Type 2 NSTEMI - troponin peaked at 0.10  - likely type 2 and secondary to demand ischemia from volume overload  - chest pain free  3. Persistent atrial fibrillation: Discontinued Amiodarone (see problem #1). Continued on eliquis and coreg. Remained in NSR with PACs, PVCs or rate controlled a. Fib.   4. Acute on chronic anemia  Hgb 10.1 on admission trended down to 9 this morning, last CBC was one month ago with hemoglobin 12.4.  - continue iron supplementation  - continue to  monitor CBC   5. History of DVT  - continue eliquis 5 mg BID   6. Type 2 diabetes: Sugars elevated with steroid treatment. Treated with Lantus and Novolog. Discharged on home [place].   7. History of esophogeal cancer: Malnourished from recent chemotherapy. Denied throat pain or difficulty swallowing.     Discharge Vitals:   BP (!) 116/57   Pulse 70   Temp 97.7 F (36.5 C) (Oral)   Resp (!) 24   Ht 5\' 11"  (1.803 m)   Wt 77.6 kg   SpO2 95%   BMI 23.85 kg/m   Pertinent Labs, Studies, and Procedures:  Echo 8/2: EF 45-50%, there was inferolateral and inferior [place] and inferoseptal hypokinesis evaluation. Diastolic function could not be evaluated because he was in atrial fibrillation at the time. There was mild to moderate right ventricular systolic function. Increased intravascular volume was evident by pulmonary hypertension ( 44 mm Hg) and a dilated and non compressible IVC. When compared to prior echocardiogram in March, the wall motion abnormalities have advanced and the volume overload is new.   Discharge Instructions: Discharge Instructions    Call MD for:   Complete by:  As directed    Difficulty breathing, shortness of breath, or increased oxygen requirements   Diet - low sodium heart healthy   Complete by:  As directed    Discharge instructions   Complete by:  As directed    Justin Blackburn, Justin Blackburn were admitted to the hospital for trouble breathing. This was likely due to a couple things...you were fluid overloaded from being off your diuretic for heart failure, you may have had a pneumonia, and the Amiodarone may have become toxic to your lungs. We treated you with antibiotics just in case you did have a pneumonia. We got the extra fluid off you by using IV diuretics and then putting you back on your oral diuretic medicine (furosemide, also called Lasix). We also stopped Amiodarone and treated you with steroids. This seemed to help the most.   On discharge, please  continue taking oral Prednisone (steroid). You will be on this medicine for awhile (maybe months), while your lungs heal. You should have instructions for how to taper the Prednisone over the next couple weeks. If at anytime, you get more short of breath or need more oxygen, go back to whatever higher dose of Prednisone you were previously taking. For example, right now, you are taking 40mg  a day. Next  week, when you decrease to 20mg  a day, if you start feeling more short of breath or needing more oxygen on 20mg  Prednisone, go back to taking 40mg  a day. You have an appt with a lung doctor (Dr. Lamonte Sakai) on September 10th.    You should probably never take Amiodarone again.   Please keep taking Lasix (furosemide).  We did not give you your medicine called Candesartan because your blood pressures were a little low and you have passed out before due to low blood pressures. Please do not take Candesartan again until you follow up with Dr. Tamala Julian and ask him about it.   I have set up home health physical therapy, occupational therapy, nursing, and social work. They can assist you with anything you need.   If you have any questions or concerns, please don't hesitate to call us at [phone]. This is the number for the Internal Medicine Clinic at [facility] and they can help answer questions or get you in touch with one of the doctors that took care of you while you were in the hospital.   Increase activity slowly   Complete by:  As directed       Signed: [name], MD 01/27/2018, 1:08 PM   Pager: [phone]

## 2018-01-24 NOTE — Care Management Important Message (Signed)
Important Message  Patient Details  Name: Justin Blackburn. MRN: [id number] Date of Birth: February 05, 1951   Medicare Important Message Given:  Yes    Delorse Lek 01/24/2018, 3:56 PM

## 2018-01-24 NOTE — Progress Notes (Signed)
Inpatient Rehabilitation-Admissions Coordinator   Met with pt and his family at the bedside as follow up from PM&R consult. Pt continues to show limited activity tolerance and desaturations. AC explained high intensity CIR program with pt agreeing he may be more appropriate for a longer term, less intensive program, such as a SNF for rehab. AC has noted SNF bed being sought by SW. [facility] will sign off.   Justin Blackburn, OTR/L  Rehab Admissions Coordinator  [phone] 01/24/2018 3:35 PM

## 2018-01-24 NOTE — NC FL2 (Signed)
[place] LEVEL OF CARE SCREENING TOOL     IDENTIFICATION  Patient Name: Justin Blackburn. Birthdate: [birth year] Sex: male Admission Date (Current Location): 01/14/2018  [facility] and [place] Number:  Herbalist and Address:  [facility] [place] [address], [place], [place] [address]      Provider Number: [id number]  Attending Physician Name and Address:  Annia Belt, MD  Relative Name and Phone Number:  Charan Prieto, spouse, [phone]    Current Level of Care: Hospital Recommended Level of Care: [place] Prior Approval Number:    Date Approved/Denied:   PASRR Number: [id number]  Discharge Plan: SNF    Current Diagnoses: Patient Active Problem List   Diagnosis Date Noted  . AF (paroxysmal atrial fibrillation) ([place])   . Hypoxia   . Steroid-induced hyperglycemia   . Hypokalemia   . Supplemental oxygen dependent   . Diffuse interstitial pulmonary disease ([place])   . Microcytic anemia 01/16/2018  . Acute respiratory failure with hypoxia ([place])   . History of DVT (deep vein thrombosis) 01/15/2018  . Malnutrition of moderate degree 01/15/2018  . Acute congestive heart failure ([place]) 01/14/2018  . Pressure injury of skin 01/14/2018  . Acute pulmonary edema (HCC)   . Diabetes mellitus type 2, insulin dependent ([place])   . Esophageal cancer ([place]) 12/20/2017  . DVT (deep venous thrombosis) ([place]) 12/07/2017  . AKI (acute kidney injury) ([place]) 12/07/2017  . Atrial fibrillation, persistent ([place]) 12/06/2017  . Postural dizziness with presyncope 12/06/2017  . Encounter for antineoplastic chemotherapy 11/11/2017  . Malignant neoplasm of distal third of esophagus ([place]) 09/04/2017  . Atrial flutter ([place]) 09/01/2017  . Elevated troponin   . Iron deficiency anemia   . Coronary artery disease involving coronary bypass graft of native heart without angina pectoris 08/28/2016  . 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 Anticoagulated 02/06/2015  . Acute on chronic combined systolic and diastolic heart failure ([place]) 01/30/2015  . Demand ischemia ([place]) 01/30/2015  . Chronic combined systolic and diastolic heart failure, NYHA class 2 ([place]) 04/17/2013  . Umbilical hernia   . NSTEMI (non-ST elevated myocardial infarction) ([place])   . Peripheral vascular disease ([place])   . Hypercholesteremia   . Hypertension 09/01/2011  . Diabetes mellitus ([place]) 09/01/2011    Orientation RESPIRATION BLADDER Height & Weight     Self, Time, Situation, Place  O2(nasal cannula 2L) Continent Weight: 181 lb 10.5 oz (82.4 kg) Height:  5\' 11"  (180.3 cm)  BEHAVIORAL SYMPTOMS/MOOD NEUROLOGICAL BOWEL NUTRITION STATUS      Continent Diet(heart healthy/carb modified, thin liquids, liquid restriction, 1800 mL)  AMBULATORY STATUS COMMUNICATION OF NEEDS Skin   Limited Assist Verbally PU Stage and Appropriate Care, Other (Comment)(PU stage I sacrum, open wound L knee) PU Stage 1 Dressing: (Sacrum)                     Personal Care Assistance Level of Assistance  Bathing, Feeding, Dressing Bathing Assistance: Limited assistance Feeding assistance: Independent Dressing Assistance: Limited assistance     Functional Limitations Info  Sight, Hearing, Speech Sight Info: Adequate Hearing Info: Adequate Speech Info: Adequate    SPECIAL CARE FACTORS FREQUENCY  PT (By licensed PT), OT (By licensed OT)     PT Frequency: 5x/week OT Frequency: 5x/week            Contractures Contractures Info: Not present    Additional Factors Info  Code  Status, Allergies, Insulin Sliding Scale Code Status Info: Full Allergies Info: No Known Allergies   Insulin Sliding Scale Info: novolog 3x/day with meals, lantus daily       Current Medications (01/24/2018):  This is the current hospital active medication list Current Facility-Administered Medications  Medication Dose Route Frequency Provider Last Rate  Last Dose  . acetaminophen (TYLENOL) tablet 650 mg  650 mg Oral Q6H PRN Parrett, Tammy S, NP   650 mg at 01/16/18 1946   Or  . acetaminophen (TYLENOL) suppository 650 mg  650 mg Rectal Q6H PRN Parrett, Tammy S, NP      . apixaban (ELIQUIS) tablet 5 mg  5 mg Oral BID Parrett, Tammy S, NP   5 mg at 01/24/18 1057  . atorvastatin (LIPITOR) tablet 40 mg  40 mg Oral q1800 Parrett, Tammy S, NP   40 mg at 01/23/18 1647  . benzonatate (TESSALON) capsule 200 mg  200 mg Oral TID Tarry Kos, MD   200 mg at 01/24/18 1101  . carvedilol (COREG) tablet 3.125 mg  3.125 mg Oral BID WC Parrett, Tammy S, NP   3.125 mg at 01/24/18 0857  . chlorhexidine (PERIDEX) 0.12 % solution 15 mL  15 mL Mouth Rinse BID Annia Belt, MD   15 mL at 01/24/18 1105  . clopidogrel (PLAVIX) tablet 75 mg  75 mg Oral Daily Parrett, Tammy S, NP   75 mg at 01/24/18 1057  . furosemide (LASIX) tablet 40 mg  40 mg Oral BID Ledell Noss, MD   40 mg at 01/24/18 1329  . gabapentin (NEURONTIN) capsule 300 mg  300 mg Oral BID Parrett, Tammy S, NP   300 mg at 01/24/18 1057  . Gerhardt's butt cream   Topical TID PRN Annia Belt, MD      . insulin aspart (novoLOG) injection 0-20 Units  0-20 Units Subcutaneous TID WC Tarry Kos, MD   7 Units at 01/24/18 1329  . insulin aspart (novoLOG) injection 10 Units  10 Units Subcutaneous TID WC Ledell Noss, MD   10 Units at 01/24/18 1330  . [START ON 01/25/2018] insulin glargine (LANTUS) injection 22 Units  22 Units Subcutaneous Daily Ledell Noss, MD      . MEDLINE mouth rinse  15 mL Mouth Rinse q12n4p Annia Belt, MD   15 mL at 01/20/18 1625  . multivitamin with minerals tablet 1 tablet  1 tablet Oral Daily Annia Belt, MD   1 tablet at 01/24/18 1101  . pantoprazole (PROTONIX) EC tablet 40 mg  40 mg Oral Daily Parrett, Tammy S, NP   40 mg at 01/24/18 1057  . polyethylene glycol (MIRALAX / GLYCOLAX) packet 17 g  17 g Oral Daily PRN Parrett, Tammy S, NP      .  potassium chloride SA (K-DUR,KLOR-CON) CR tablet 40 mEq  40 mEq Oral BID [name], MD   40 mEq at 01/24/18 1101  . predniSONE (DELTASONE) tablet 60 mg  60 mg Oral Q breakfast [name], MD   60 mg at 01/24/18 0857  . sucralfate (CARAFATE) tablet 1 g  1 g Oral TID WC & HS Parrett, Tammy S, NP   1 g at 01/24/18 1329     Discharge Medications: Please see discharge summary for a list of discharge medications.  Relevant Imaging Results:  Relevant Lab Results:   Additional Information SSN: [id number]  Estanislado Emms, LCSW

## 2018-01-24 NOTE — Progress Notes (Signed)
DAILY PROGRESS NOTE   Patient Name: Justin Blackburn. Date of Encounter: 01/24/2018  Chief Complaint   Breathing has improved  Patient Profile   67 y.o. male with past medical history of combined systolic/diastolic congestive heart failure, PAF, coronary artery disease, esophageal cancer admitted with respiratory failure and acute on chronic combined systolic/diastolic congestive heart failure.  Subjective   Transitioned to oral lasix and continues to be net negative overnight.   Objective   Vitals:   01/24/18 0012 01/24/18 0400 01/24/18 0736 01/24/18 0857  BP: 127/89 (!) 145/78 (!) 263/91 (!) 163/91  Pulse: 67 63 (!) 58 89  Resp: 20 18    Temp: 97.7 F (36.5 C) 97.6 F (36.4 C) 98.1 F (36.7 C)   TempSrc: Axillary Oral Oral   SpO2: 97% 99% 98%   Weight:  82.4 kg    Height:        Intake/Output Summary (Last 24 hours) at 01/24/2018 0914 Last data filed at 01/24/2018 0700 Gross per 24 hour  Intake 100 ml  Output 1875 ml  Net -1775 ml   Filed Weights   01/22/18 0437 01/23/18 0414 01/24/18 0400  Weight: 86.3 kg 85.4 kg 82.4 kg    Physical Exam   General appearance: alert and no distress Lungs: diminished breath sounds bibasilar Heart: irregularly irregular rhythm Abdomen: soft, non-tender; bowel sounds normal; no masses,  no organomegaly Extremities: edema trace RLE and 1+ LLE edema Pulses: 2+ and symmetric Neurologic: Grossly normal  Inpatient Medications    Scheduled Meds: . apixaban  5 mg Oral BID  . atorvastatin  40 mg Oral q1800  . benzonatate  200 mg Oral TID  . carvedilol  3.125 mg Oral BID WC  . chlorhexidine  15 mL Mouth Rinse BID  . clopidogrel  75 mg Oral Daily  . furosemide  40 mg Oral BID  . gabapentin  300 mg Oral BID  . insulin aspart  0-20 Units Subcutaneous TID WC  . insulin aspart  10 Units Subcutaneous TID WC  . insulin glargine  20 Units Subcutaneous Daily  . mouth rinse  15 mL Mouth Rinse q12n4p  . multivitamin with minerals  1  tablet Oral Daily  . pantoprazole  40 mg Oral Daily  . potassium chloride  40 mEq Oral BID  . predniSONE  60 mg Oral Q breakfast  . sucralfate  1 g Oral TID WC & HS    Continuous Infusions:   PRN Meds: acetaminophen **OR** acetaminophen, Gerhardt's butt cream, polyethylene glycol   Labs   Results for orders placed or performed during the hospital encounter of 01/14/18 (from the past 48 hour(s))  Glucose, capillary     Status: Abnormal   Collection Time: 01/22/18 12:17 PM  Result Value Ref Range   Glucose-Capillary 226 (H) 70 - 99 mg/dL  Glucose, capillary     Status: Abnormal   Collection Time: 01/22/18  4:51 PM  Result Value Ref Range   Glucose-Capillary 234 (H) 70 - 99 mg/dL  Glucose, capillary     Status: Abnormal   Collection Time: 01/22/18  9:35 PM  Result Value Ref Range   Glucose-Capillary 210 (H) 70 - 99 mg/dL   Comment 1 Notify RN   Magnesium     Status: None   Collection Time: 01/23/18  2:49 AM  Result Value Ref Range   Magnesium 1.9 1.7 - 2.4 mg/dL    Comment: Performed at [facility], [place] [address], [place], [place] [address]  Basic metabolic panel     Status: Abnormal   Collection Time: 01/23/18  2:49 AM  Result Value Ref Range   Sodium 137 135 - 145 mmol/L   Potassium 4.0 3.5 - 5.1 mmol/L   Chloride 98 98 - 111 mmol/L   CO2 32 22 - 32 mmol/L   Glucose, Bld 165 (H) 70 - 99 mg/dL   BUN 32 (H) 8 - 23 mg/dL   Creatinine, Ser 0.94 0.61 - 1.24 mg/dL   Calcium 8.1 (L) 8.9 - 10.3 mg/dL   GFR calc non Af Amer >60 >60 mL/min   GFR calc Af Amer >60 >60 mL/min    Comment: (NOTE) The eGFR has been calculated using the CKD EPI equation. This calculation has not been validated in all clinical situations. eGFR's persistently <60 mL/min signify possible Chronic Kidney Disease.    Anion gap 7 5 - 15    Comment: Performed at [facility] [address] [place], [place] [address]  Glucose, capillary     Status: Abnormal   Collection Time: 01/23/18   8:11 AM  Result Value Ref Range   Glucose-Capillary 125 (H) 70 - 99 mg/dL  Glucose, capillary     Status: Abnormal   Collection Time: 01/23/18 12:10 PM  Result Value Ref Range   Glucose-Capillary 164 (H) 70 - 99 mg/dL  Glucose, capillary     Status: Abnormal   Collection Time: 01/23/18  4:33 PM  Result Value Ref Range   Glucose-Capillary 181 (H) 70 - 99 mg/dL  Glucose, capillary     Status: Abnormal   Collection Time: 01/23/18  9:04 PM  Result Value Ref Range   Glucose-Capillary 231 (H) 70 - 99 mg/dL   Comment 1 Notify RN   Basic metabolic panel     Status: Abnormal   Collection Time: 01/24/18  2:02 AM  Result Value Ref Range   Sodium 136 135 - 145 mmol/L   Potassium 4.1 3.5 - 5.1 mmol/L   Chloride 97 (L) 98 - 111 mmol/L   CO2 30 22 - 32 mmol/L   Glucose, Bld 239 (H) 70 - 99 mg/dL   BUN 30 (H) 8 - 23 mg/dL   Creatinine, Ser 0.85 0.61 - 1.24 mg/dL   Calcium 7.9 (L) 8.9 - 10.3 mg/dL   GFR calc non Af Amer >60 >60 mL/min   GFR calc Af Amer >60 >60 mL/min    Comment: (NOTE) The eGFR has been calculated using the CKD EPI equation. This calculation has not been validated in all clinical situations. eGFR's persistently <60 mL/min signify possible Chronic Kidney Disease.    Anion gap 9 5 - 15    Comment: Performed at [facility] [address], [place], [place] [address]  Glucose, capillary     Status: Abnormal   Collection Time: 01/24/18  7:51 AM  Result Value Ref Range   Glucose-Capillary 141 (H) 70 - 99 mg/dL    ECG   N/A  Telemetry   Afib with CVR - Personally Reviewed  Radiology    No results found.  Cardiac Studies   N/A  Assessment   1. Principal Problem: 2.   Acute on chronic combined systolic and diastolic heart failure ([place]) 3. Active Problems: 4.   NSTEMI (non-ST elevated myocardial infarction) ([place]) 5.   Coronary artery disease involving coronary bypass graft of native heart without angina pectoris 6.   Atrial fibrillation, persistent  ([place]) 7.   Pressure injury of skin 8.  Acute pulmonary edema (HCC) 9.   Diabetes mellitus type 2, insulin dependent ([place]) 10.   History of DVT (deep vein thrombosis) 11.   Malnutrition of moderate degree 12.   Microcytic anemia 13.   Acute respiratory failure with hypoxia (HCC) 14.   Diffuse interstitial pulmonary disease ([place]) 15.   Hypoxia 16.   Steroid-induced hyperglycemia 17.   Hypokalemia 18.   Supplemental oxygen dependent 19.   Plan   1. Continues on oral lasix for diuresis. Rate-controlled a-fib. No further suggestions from a cardiac standpoint. Will sign-off. Has follow-up with Dr. Tamala Julian scheduled on 02/01/18.  [facility] will sign off.   Cardiac Medication Recommendations:  Lasix 40 mg po BID, Eliquis 5 mg BID, Atorvastatin 40 mg QHS, Coreg 3.125 mg BID, Plavix 75 mg daily,  Other recommendations (labs, testing, etc):  none Follow up as an outpatient:  Dr. Daneen Schick on 02/01/18   Time Spent Directly with Patient:  I have spent a total of 15 minutes with the patient reviewing hospital notes, telemetry, EKGs, labs and examining the patient as well as establishing an assessment and plan that was discussed personally with the patient.  > 50% of time was spent in direct patient care.  Length of Stay:  LOS: 10 days   Pixie Casino, MD, [facility], [place] Director of the Advanced Lipid Disorders &  Cardiovascular Risk Reduction Clinic Diplomate of the American Board of Clinical Lipidology Attending Cardiologist  Direct Dial: [phone]  Fax: [phone]  Website:  [email or web address] 01/24/2018, 9:14 AM

## 2018-01-24 NOTE — Progress Notes (Signed)
Subjective: Justin Blackburn. Documented as being in a. Fib but review of telemetry this morning shows sinus rhythm with first degree AV block. Justin Blackburn is laying in bed. No complaints. Maintaining O2 sats >90% on 2L [place] at rest and with movement. Discussed reevaluating him for dispo SNF vs. CIR   Objective:  Vital signs in last 24 hours: Vitals:   01/23/18 2000 01/24/18 0012 01/24/18 0400 01/24/18 0736  BP:  127/89 (!) 145/78 (!) 263/91  Pulse: 62 67 63 (!) 58  Resp: 19 20 18    Temp:  97.7 F (36.5 C) 97.6 F (36.4 C) 98.1 F (36.7 C)  TempSrc:  Axillary Oral Oral  SpO2: 91% 97% 99% 98%  Weight:   82.4 kg   Height:       General: chronically ill appearing, no acute distress  Cardiac: regular rate and rhythm, no murmurs appreciated, 2+ lower extremity edema on the left, no calf tenderness  Pulm: coarse dry crackles in bilateral bases GI: abdomen soft, non tender, non distended   Assessment/Plan:  Justin Blackburn is a 67 yo male with history ofchronic combinedCHF secondary to ischemic cardiomyopathywithDESto the LADand LCx in Feb 2019 and s/p CABG x2, esophogeal adenocarcinoma s/p chemoradiation, afib/flutter who presented with hypoxemia to 66% on RA and was admitted for volume overload and hypoxic respiratory failure.  #Acute hypoxic respiratory failure: [place] was set at 2lpm, sats maintained > 90% at rest and with movement. Differential includes amiodarone pulmonary toxicity vs. Radiation pneumonitis or radiation induced scaring.  - wean O2 as tolerated -Lasix 40mg  po BID. If this is ineffective. Consider switching to Torsemide 40mg  BID - s/p Azithromycin (8/5 - 8/9) s/p Cefepime (8/4 - 8/8)  - continue po prednisone  - Continue holding amiodarone. Respiratory status seemed to improve with discontinuation of amiodarone and initiation of high dose steroids. Although, he was only taking Amiodarone since December 07, 2017 and cardiology does not feel this is long enough to cause pulmonary  toxicity. Regardless, steroids are the treatment for radiation pneumonitis and amiodarone toxicity.  - BCx negative - If fevers, add back azithromycin and vancomycin and redraw blood cultures  #Acute on chronic combined systolic and diastolic congestive heart failure secondary to ischemic cardiomyopathy #Acute pulmonary edema ([place]): Diuresis has stabilized.BUN and creatine remain improved today after stopping IV lasix.  - continue po lasix 40mg  BID - Replete K+ and Mg+ as needed - continue home coreg, plavix, and statin  -not onACE/ ARBdue to orthostatic hypotension in the past, we should resume this at a very low dose when he is stable - cardiology is on board, we appreciate their assistance in the care of this patient.   #Type 2 NSTEMI - troponin peaked at 0.10  - type 2 and secondary to demand ischemia from volume overload  - remains chest pain free   #Atrial fibrillation, paroxysmal (HCC) - sinus rhythm with 1st degree AV block, rate <105bpm - Denies chest pain or palpitations - Discontinued amiodarone due to possibility of pulmonary toxicity - Continue carvedilol 3.125 twice daily for rate control -Continue Eliquis for stroke prophylaxis and DVT as noted below -Continue electrolyte and telemetry monitoring  #Microcytic anemia  - hemoglobin remains stable at 9-10 - continue daily iron supplementation  #History of DVT (deep vein thrombosis) - continue eliquis   #Diabetes mellitus type 2, insulin dependent (Justin Blackburn):  - Blood glucose 125-231over the past 24 hours. Increased with po steroids - lantus increased to 20 units today  - Novolog 10 units with meals  -  Continue blood glucose monitoring and SSI  #Pressure injury of skin: Stage 1 Sacrum Malnutrition of moderate degree -Dietitian following, we appreciate their assistance in the care of this patient  Dispo: Anticipated discharge in approximately 1-2 day(s) pending SNF placement.   Justin Course,  MD 01/24/2018, 8:17 AM Pager: [phone]

## 2018-01-24 NOTE — Progress Notes (Signed)
Medicine attending: I examined this patient today together with resident physician Dr Francesco Runner and I concur with her evaluation and management plan. Stable oxygenation.  Now down to just 2 L nasal cannula. Monitor with sinus rhythm.  PACs and PVCs. Afebrile off all antibiotics. Weight down to 182 pounds.  Peak weight recorded this admission 208. He continues to diurese well now on Lasix p.o. 40 mg twice daily.  Net -14.8 L. Blood sugars under reasonable control now on prednisone 60 mg daily. Discharge plans in progress. Impression: 1.  ARDS multifactorial decompensated systolic and diastolic heart failure, question opportunistic pulmonary infection versus amiodarone toxicity. 2.  Hypoxic respiratory failure secondary to 1-resolving 3.  Type 2 insulin-dependent diabetes with spike in glucose on steroids- improving 4.  Paroxysmal atrial fibrillation.  Currently in sinus rhythm 5.  Ischemic cardiac disease.  Non-ST elevation MI this admission.  Likely demand ischemia from hypoxia. 6.  Extensive proximal and distal left lower extremity DVT 6/19 7.  Chronic anticoagulation for #4 and 6 above.

## 2018-01-25 LAB — GLUCOSE, CAPILLARY
GLUCOSE-CAPILLARY: 286 mg/dL — AB (ref 70–99)
GLUCOSE-CAPILLARY: 295 mg/dL — AB (ref 70–99)
Glucose-Capillary: 121 mg/dL — ABNORMAL HIGH (ref 70–99)
Glucose-Capillary: 282 mg/dL — ABNORMAL HIGH (ref 70–99)

## 2018-01-25 MED ORDER — PREDNISONE 20 MG PO TABS
40.0000 mg | ORAL_TABLET | Freq: Every day | ORAL | Status: DC
  Administered 2018-01-26 – 2018-01-27 (×2): 40 mg via ORAL
  Filled 2018-01-25 (×2): qty 2

## 2018-01-25 MED ORDER — INSULIN ASPART 100 UNIT/ML ~~LOC~~ SOLN
12.0000 [IU] | Freq: Three times a day (TID) | SUBCUTANEOUS | Status: DC
Start: 2018-01-25 — End: 2018-01-27
  Administered 2018-01-25 – 2018-01-27 (×7): 12 [IU] via SUBCUTANEOUS

## 2018-01-25 NOTE — Progress Notes (Signed)
Medicine attending discharge note: I personally examined this patient on the day of planned discharge and I attest to the accuracy of the evaluation and management plan as recorded in the final progress note by resident physician Dr Francesco Runner and which will be also detailed in her formal discharge summary note.  Complex admission for this 67 year old man with known coronary artery disease, status post bypass surgery, status post coronary stenting, ischemic cardiomyopathy with reduced systolic ejection fraction.  Paroxysmal atrial fibrillation.  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 Hospital admission in June with recurrent atrial fibrillation, rapid rate, and new extensive proximal and distal left lower extremity DVT.  Significant deconditioning and dehydration.  Diuretics were held.  He presented at time of current admission on August 2 with hypoxic respiratory failure.  Diffuse bilateral pulmonary infiltrates.  Aggressive parenteral diuresis with no improvement in hypoxia.  CT scan of the chest with extensive bilateral groundglass infiltrates.  Initial nonproductive cough, low-grade temperature and mild leukocytosis.  He was treated for presumptive opportunistic infection.  Senior medical resident considered amiodarone toxicity and the drug was discontinued.  Temporary short-term transfer to the intensive care unit when he had a hypoxic near syncopal episode.  Significant clinical improvement when he was given high-dose steroids.  Slow but steady improvement after that with ability to wean him down to just 2-3 L of oxygen and maintain acceptable oxygen saturation.  He was transitioned to oral prednisone. All cultures negative.  Urinary Legionella antigen negative. He remained in sinus rhythm off the amiodarone.  Occasional PACs and PVCs. Parenteral diuretics transitioned to oral furosemide and he will be discharged on 40 mg twice daily.  Consider  resuming ARB after discharge per cardiology. Borderline elevation of troponin enzymes on admission with no acute ischemic changes on cardiogram. He is deconditioned from prolonged hospital stay and recent cancer treatments. A small stage I pressure ulcer in the sacrum was noted and monitored.  Disposition: Condition stable enough for discharge but he will need short-term rehabilitation. Bed offer at [facility].  Discharge when final insurance approval made. Follow-up with cardiology, Dr. Daneen Schick There were no complications other than those related to his primary illness

## 2018-01-25 NOTE — Progress Notes (Signed)
Occupational Therapy Treatment Patient Details Name: Justin Blackburn. MRN: [id number] DOB: [birth year] Today's Date: 01/25/2018    History of present illness Pt is a 67 y.o. male with PMH of CAD (s/p CABG, stents), HTN, esophageal adenocarcinoma (stage T3) s/p chemo/radiation, PAD, a-flutter/a-fib, DM2, and recent LLE DVT (12/07/17), admitted 01/14/18 after fall. Worked up for CHF exacerbation and hypoxemia. Pt with worsening respiratory status on 8/5 requiring continuous BiPAP and transfer to ICU.    OT comments  Pt progressing towards established OT goals. Pt performing functional mobility towards sink with Min Guard A and RW for balance and safety. Pt washing his face at sink while sanding with Min A. Pt presenting with decreased activity tolerance and requiring to sit after ~15 seconds of standing at sink. Pt completing grooming while seated. Continue to recommend post-acute rehab and will continue to follow acutely as admitted.    Follow Up Recommendations  Supervision/Assistance - 24 hour;SNF    Equipment Recommendations  3 in 1 bedside commode    Recommendations for Other Services      Precautions / Restrictions Precautions Precautions: Fall Precaution Comments: watch SpO2; at rest 98% on 2L with coughing dropping to 79% with increase to 3L Restrictions Weight Bearing Restrictions: No       Mobility Bed Mobility Overal bed mobility: Modified Independent             General bed mobility comments: Increased time and HOB elevated  Transfers Overall transfer level: Needs assistance Equipment used: Rolling walker (2 wheeled) Transfers: Sit to/from Stand Sit to Stand: Min guard         General transfer comment: cues for hand placement and safety as well as backing to chair    Balance Overall balance assessment: Needs assistance Sitting-balance support: Feet supported Sitting balance-Leahy Scale: Good     Standing balance support: Bilateral upper extremity  supported Standing balance-Leahy Scale: Fair                             ADL either performed or assessed with clinical judgement   ADL Overall ADL's : Needs assistance/impaired     Grooming: Standing;Minimal assistance;Wash/dry face;Sitting Grooming Details (indicate cue type and reason): Pt able to wash his face at the sink with MIn A for safety and balance. Requiring to sit after ~15 seconds at sink due to coughing sit and fatigue. Pt completing task while seated.                 Toilet Transfer: Ambulation;RW;Min guard(Simulated to recliner) [employer] forces technical officer Details (indicate cue type and reason): Min Guard A for safe descent to recliner surface.          Functional mobility during ADLs: Rolling walker;Cueing for safety;Min guard General ADL Comments: Pt presenting with increased activity tolerance compared to prior session. However, continues to fatigue quickly and require seated rest breaks     Vision       Perception     Praxis      Cognition Arousal/Alertness: Awake/alert Behavior During Therapy: WFL for tasks assessed/performed Overall Cognitive Status: Within Functional Limits for tasks assessed                                          Exercises     Shoulder Instructions       General Comments SpO2 dropping  to 79% on 3L O2 during activity. HR 110. 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 OT Goal Formulation: With patient/family Time For Goal Achievement: 02/01/18 Potential to Achieve Goals: Good ADL  Goals Pt Will Perform Grooming: with set-up;standing Pt Will Perform Upper Body Bathing: with set-up;sitting Pt Will Perform Lower Body Bathing: with set-up;with supervision;sit to/from stand Pt Will Transfer to Toilet: with supervision;bedside commode;ambulating Pt Will Perform Toileting - Clothing Manipulation and hygiene: with supervision  Plan Discharge plan remains appropriate    Co-evaluation                 AM-PAC PT "6 Clicks" Daily Activity     Outcome Measure   Help from another person eating meals?: A Little Help from another person taking care of personal grooming?: A Little Help from another person toileting, which includes using toliet, bedpan, or urinal?: A Lot Help from another person bathing (including washing, rinsing, drying)?: A Lot Help from another person to put on and taking off regular upper body clothing?: A Lot Help from another person to put on and taking off regular lower body clothing?: A Lot 6 Click Score: 14    End of Session Equipment Utilized During Treatment: Gait belt;Oxygen;Rolling walker(3L)  OT Visit Diagnosis: Unsteadiness on feet (R26.81);Muscle weakness (generalized) (M62.81)   Activity Tolerance Patient limited by fatigue(desat to 79 on 3L)   Patient Left with call bell/phone within reach;with family/visitor present;in chair   Nurse Communication Mobility status;Other (comment)(SpO2)        Time: 3710-6269 OT Time Calculation (min): 18 min  Charges: OT General Charges $OT Visit: 1 Visit OT Treatments $Self Care/Home Management : 8-22 mins  [place], OTR/L Acute Rehab Pager: [phone] Office: [place] 01/25/2018, 4:26 PM

## 2018-01-25 NOTE — Progress Notes (Addendum)
Subjective: [name]. We woke Justin Blackburn up when we went into his room. He has not complaints. He was able to take a bath yesterday and work with PT/OT. He is encouraged by the small steps of progress. He continues on 3L [place], talking comfortably. Last BM was yesterday   Objective:  Vital signs in last 24 hours: Vitals:   01/24/18 1933 01/24/18 2318 01/25/18 0348 01/25/18 0500  BP: 120/67 (!) 142/73 132/82   Pulse: 76 64 (!) 59   Resp: (!) 26 20 (!) 22   Temp: 98.3 F (36.8 C) 98.2 F (36.8 C) 98 F (36.7 C)   TempSrc: Oral Oral Oral   SpO2: 99% 95% 91%   Weight:    79.6 kg  Height:       General: chronically ill appearing, no acute distress  Cardiac: regular rate and rhythm, no murmurs appreciated  Pulm: coarse dry crackles in bilateral bases GI: abdomen soft, non tender, non distended  Ext: 2+ lower extremity edema on the left, no calf tenderness. Right left no edema  Assessment/Plan:  Justin Blackburn is a 67 yo male with history ofchronic combinedCHF secondary to ischemic cardiomyopathywithDESto the LADand LCx in Feb 2019 and s/p CABG x2, esophogeal adenocarcinoma s/p chemoradiation, afib/flutter who presented with hypoxemia to 66% on RA and was admitted for volume overload and hypoxic respiratory failure.  #Acute hypoxic respiratory failure: [place] was set at 3lpm, sats maintained > 88% at rest and with movement. Differential includes amiodarone pulmonary toxicity vs. Radiation pneumonitis or radiation induced scaring vs pneumonia.  - wean O2 as tolerated -Lasix 40mg  po BID. If this is ineffective. Consider switching to Torsemide 40mg  BID - s/p Azithromycin (8/5 - 8/9) s/p Cefepime (8/4 - 8/8)  - continue po prednisone. Will decrease from 60mg  to 40mg . Slow taper. If respiratory status declines, increase prednisone to previous dose. This tapering process could take anywhere from 2-12 months to complete.  - Continue holding amiodarone. Respiratory status seemed to improve with  discontinuation of amiodarone and initiation of high dose steroids. Although, he was only taking Amiodarone since December 07, 2017 and cardiology does not feel this is long enough to cause pulmonary toxicity. Regardless, steroids are the treatment for radiation pneumonitis and amiodarone toxicity.  - BCx negative - If fevers, add back azithromycin and vancomycin and redraw blood cultures  #Acute on chronic combined systolic and diastolic congestive heart failure secondary to ischemic cardiomyopathy #Acute pulmonary edema ([place]): Diuresis has stabilized.BUN and creatine remain at baseline - continue po lasix 40mg  BID - Replete K+ and Mg+ as needed - continue home coreg, plavix, and statin  -not onACE/ ARBdue to orthostatic hypotension in the past, we should resume this at a very low dose when he is stable. Holding home candesartan. - cardiology is on board, we appreciate their assistance in the care of this patient.   #Type 2 NSTEMI - troponin peaked at 0.10  - type 2 and secondary to demand ischemia from volume overload  - remains chest pain free   #Atrial fibrillation, paroxysmal (HCC) - sinus rhythm with 1st degree AV block, rate <105bpm - Denies chest pain or palpitations - Discontinued amiodarone due to possibility of pulmonary toxicity - Continue carvedilol 3.125 twice daily for rate control -Continue Eliquis for stroke prophylaxis and DVT as noted below -Continue electrolyte and telemetry monitoring  #Microcytic anemia  - hemoglobin remains stable at 9-10 - continue daily iron supplementation  #History of DVT (deep vein thrombosis) - continue eliquis   #Diabetes  mellitus type 2, insulin dependent ([place]):  - Blood glucose 125-231over the past 24 hours. Increased with po steroids - lantus increased to 20 units today  - Novolog 12 units with meals  - Continue blood glucose monitoring and SSI  #Pressure injury of skin: Stage 1 Sacrum Malnutrition of moderate  degree -Dietitian following, we appreciate their assistance in the care of this patient  Dispo: Anticipated discharge in approximately 1-2 day(s) pending SNF placement.   [name], MD 01/25/2018, 7:57 AM Pager: [phone]

## 2018-01-25 NOTE — Progress Notes (Signed)
Clinical Social Worker following patient for support and discharge need. Patient's spouse was made aware that [facility] SNF made a bed offer. Mrs.Rosana Hoes took offer and requested authorization be started. Facility started authorization through [place], awaiting response back from facility.   Rhea Pink, MSW,  [place]

## 2018-01-25 NOTE — Progress Notes (Signed)
Inpatient Diabetes Program Recommendations  AACE/ADA: New Consensus Statement on Inpatient Glycemic Control (2019)  Target Ranges:  Prepandial:   less than 140 mg/dL      Peak postprandial:   less than 180 mg/dL (1-2 hours)      Critically ill patients:  140 - 180 mg/dL  Results for Justin Blackburn, Justin Blackburn (MRN [id number]) as of 01/25/2018 07:12  Ref. Range 01/24/2018 07:51 01/24/2018 11:28 01/24/2018 12:22 01/24/2018 17:18 01/24/2018 21:28  Glucose-Capillary Latest Ref Range: 70 - 99 mg/dL 141 (H) 248 (H) 281 (H) 174 (H) 140 (H)    Review of Glycemic Control  Current orders for Inpatient glycemic control: Lantus 22 units daily, Novolog 0-20 units TID with meals, Novolog 0-5 units QHS, Novolog 10 units TID with meals for meal coverage  Inpatient Diabetes Program Recommendations:  Insulin - Basal: Noted Lantus was increased to 22 units daily this morning. Insulin - Meal Coverage: If steroids are continued as ordered, please consider increasing meal coverage to Novolog 12 units TID with meals.  Thanks, Barnie Alderman, RN, MSN, CDE Diabetes Coordinator Inpatient Diabetes Program [phone] (Team Pager from 8am to 5pm)

## 2018-01-25 NOTE — Progress Notes (Signed)
Nutrition Follow-up  DOCUMENTATION CODES:   Non-severe (moderate) malnutrition in context of chronic illness  INTERVENTION:   -Continue Magic Cup BID with meals, each supplement provides 290 kcals and 9 grams protein -Continue MVI with minerals daily  NUTRITION DIAGNOSIS:   Moderate Malnutrition related to chronic illness, catabolic illness, cancer and cancer related treatments as evidenced by mild fat depletion, mild muscle depletion.  Ongoing  GOAL:   Patient will meet greater than or equal to 90% of their needs  Progressing  MONITOR:   PO intake, Supplement acceptance, Weight trends, Labs  REASON FOR ASSESSMENT:   Malnutrition Screening Tool    ASSESSMENT:   68 year old man with advanced CAD s/p MI, s/p bypass surgery x2, s/p recent angioplasty and drug eluting stent placement in February 2019.  Severe multivessel disease with total occlusion of the RCA and LAD, severe stenosis. 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  Recent completed a course of chemotherapy and radiation therapy in June.  History of atrial fibrillation/flutter diagnosed in March 2019. Patient reports 40 lb weight loss since cancer diagnosis. Patient presented to the ED after he slid off of his couch while trying to use a urinal and could not get up off the floor. Ambulance was called and the patient was transported to the hospital. He was found to be severely hypoxic in the ED.  8/5- transferred to ICU due to respiratory distress 8/6- transferred to progressive care unit 8/9- start tapering of steroids and antibiotic per MD 8/10- transition to PO steroids and antibiotics   Reviewed I/O's: -2.1 L x 24 hours and -16.9 L since admission  Pt resting quietly at time of visit. Pt wife not present during visit.   Per nurse tech CBGS was just ched (in 280 range). Case discussed with RN, who reports that blood sugars remain elevated  due to steroids and meal correction scale was just added back. Intake has improved since last visit; PO 50-100%.   Per discussion with RN, pt is awaiting SNF placement. Per CSW, no bed offers yet.   Labs reviewed: CBS: 121-281 (inpatient orders for glycemic control are 0-20 units insulin aspart TID with meals, 12 units insulin aspart TID with meals, and 22 units insulin glargine daily).   Diet Order:   Diet Order            Diet heart healthy/carb modified Room service appropriate? Yes; Fluid consistency: Thin  Diet effective now              EDUCATION NEEDS:   No education needs have been identified at this time  Skin:  Skin Assessment: Skin Integrity Issues: Skin Integrity Issues:: Other (Comment) Stage I: sacrum Other: lt knee abrasion  Last BM:  01/23/18  Height:   Ht Readings from Last 1 Encounters:  01/17/18 5\' 11"  (1.803 m)    Weight:   Wt Readings from Last 1 Encounters:  01/25/18 79.6 kg    Ideal Body Weight:  80.91 kg  BMI:  Body mass index is 24.48 kg/m.  Estimated Nutritional Needs:   Kcal:  5400-8676 (26-28 kcal/kg)  Protein:  120-130 grams  Fluid:  >/= 2 L/day    Denajah Farias A. Jimmye Norman, RD, LDN, CDE Pager: [phone] After hours Pager: [phone]

## 2018-01-26 LAB — GLUCOSE, CAPILLARY
GLUCOSE-CAPILLARY: 95 mg/dL (ref 70–99)
GLUCOSE-CAPILLARY: 243 mg/dL — AB (ref 70–99)
GLUCOSE-CAPILLARY: 168 mg/dL — AB (ref 70–99)
Glucose-Capillary: 229 mg/dL — ABNORMAL HIGH (ref 70–99)

## 2018-01-26 NOTE — Progress Notes (Signed)
Inpatient Diabetes Program Recommendations  AACE/ADA: New Consensus Statement on Inpatient Glycemic Control (2019)  Target Ranges:  Prepandial:   less than 140 mg/dL      Peak postprandial:   less than 180 mg/dL (1-2 hours)      Critically ill patients:  140 - 180 mg/dL   Results for WAYLAND, BAIK (MRN [id number]) as of 01/26/2018 07:16  Ref. Range 01/25/2018 08:18 01/25/2018 12:05 01/25/2018 17:21 01/25/2018 20:41  Glucose-Capillary Latest Ref Range: 70 - 99 mg/dL 121 (H) 286 (H) 295 (H) 282 (H)    Review of Glycemic Control  Current orders for Inpatient glycemic control: Lantus 22 units daily, Novolog 0-20 units TID with meals, Novolog 0-5 units QHS, Novolog 10 units TID with meals for meal coverage; Prednisone 40 mg QAM  Inpatient Diabetes Program Recommendations:  Insulin-Correction: Please consider ordering Novolog 0-5 units QHS for bedtime correction. Insulin - Meal Coverage: If steroids are continued as ordered, please consider increasing meal coverage to Novolog 16 units TID with meals.  Thanks, Barnie Alderman, RN, MSN, CDE Diabetes Coordinator Inpatient Diabetes Program [phone] (Team Pager from 8am to 5pm)

## 2018-01-26 NOTE — Care Management Note (Signed)
Case Management Note  Patient Details  Name: Justin Blackburn. MRN: [id number] Date of Birth: Jan 24, 1951  Subjective/Objective:   From home presents with  Acute hypoxic resp failiure, chf, L DVT, , Afib, hypokalemia,  Suspect pna.               Action/Plan: NCM will follow for transition of care needs.   Expected Discharge Date:  01/18/18               Expected Discharge Plan:     In-House Referral:     Discharge planning Services  CM Consult  Post Acute Care Choice:    Choice offered to:     DME Arranged:    DME Agency:     HH Arranged:    HH Agency:     Status of Service:  In process, will continue to follow  If discussed at Long Length of Stay Meetings, dates discussed:    Additional Comments: 01/26/2018 SNF recommended - CSW working placement.  Per CSW barrier is awaiting insurance Abigail Miyamoto, [place] 01/26/2018, 3:14 PM

## 2018-01-26 NOTE — Progress Notes (Signed)
Subjective: [name]. Remains on 2-3L [place], sitting up in bed eating breakfast. He was able to work with OT yesterday and took a bath. We have a bed offer at [facility] and are awaiting insurance approval. We discussed decreasing his prednisone dose today.    Objective:  Vital signs in last 24 hours: Vitals:   01/26/18 0300 01/26/18 0354 01/26/18 0510 01/26/18 0715  BP:  126/78  137/78  Pulse: (!) 59   63  Resp: (!) 21   (!) 26  Temp:  98.2 F (36.8 C)  97.8 F (36.6 C)  TempSrc:  Oral  Oral  SpO2: 93%   91%  Weight:   78.3 kg   Height:       General: chronically ill appearing, no acute distress  Cardiac: regular rate and rhythm, no murmurs appreciated  Pulm: coarse dry crackles in bilateral bases GI: abdomen soft, non tender, non distended  Ext: 1+ lower extremity edema on the left ankle, no calf tenderness. Right leg no edema Skin: area of erythema over sacrum with small open blister  Assessment/Plan:  Justin Blackburn is a 67 yo male with history ofchronic combinedCHF secondary to ischemic cardiomyopathywithDESto the LADand LCx in Feb 2019 and s/p CABG x2, esophogeal adenocarcinoma s/p chemoradiation, afib/flutter who presented with hypoxemia to 66% on RA and was admitted for volume overload and hypoxic respiratory failure.  #Acute hypoxic respiratory failure: [place] was set at 3lpm, sats maintained > 90% at rest and with movement. Differential includes amiodarone pulmonary toxicity vs. Radiation pneumonitis or radiation induced scaring vs pneumonia.  - wean O2 as tolerated -Lasix 40mg  po BID. If this is ineffective. Consider switching to Torsemide 40mg  BID - s/p Azithromycin (8/5 - 8/9) s/p Cefepime (8/4 - 8/8)  - continue po prednisone. Will decrease from 60mg  to 40mg . Slow taper. If respiratory status declines, increase prednisone to previous dose. This tapering process could take anywhere from 2-12 months to complete.  - Continue holding amiodarone. Respiratory status seemed  to improve with discontinuation of amiodarone and initiation of high dose steroids. Although, he was only taking Amiodarone since December 07, 2017 and cardiology does not feel this is long enough to cause pulmonary toxicity. Regardless, steroids are the treatment for radiation pneumonitis and amiodarone toxicity.  - BCx negative - If fevers, add back azithromycin and vancomycin and redraw blood cultures  #Acute on chronic combined systolic and diastolic congestive heart failure secondary to ischemic cardiomyopathy #Acute pulmonary edema ([place]): Diuresis has stabilized.BUN and creatine remain at baseline - continue po lasix 40mg  BID - Replete K+ and Mg+ as needed - continue home coreg, plavix, and statin  -not onACE/ ARBdue to orthostatic hypotension in the past, we should resume this at a very low dose when he is stable. Holding home candesartan. - cardiology is on board, we appreciate their assistance in the care of this patient.   #Type 2 NSTEMI - troponin peaked at 0.10  - type 2 and secondary to demand ischemia from volume overload  - remains chest pain free   #Atrial fibrillation, paroxysmal (HCC) - sinus rhythm  rate 67 - Denies chest pain or palpitations - Discontinued amiodarone due to possibility of pulmonary toxicity - Continue carvedilol 3.125 twice daily for rate control -Continue Eliquis for stroke prophylaxis and DVT as noted below -Continue electrolyte and telemetry monitoring  #Microcytic anemia  - hemoglobin remains stable at 9-10 - continue daily iron supplementation  #History of DVT (deep vein thrombosis) - continue eliquis   #Diabetes mellitus  type 2, insulin dependent ([place]):  - Blood glucose 125-231over the past 24 hours. Increased with po steroids - lantus 20 units  - Novolog 12 units with meals  - Continue blood glucose monitoring and SSI  #Pressure injury of skin: Stage 1 Sacrum Malnutrition of moderate degree -Dietitian following, we  appreciate their assistance in the care of this patient  Dispo: Anticipated discharge in approximately 1-2 day(s) pending SNF placement.   Justin Course, MD 01/26/2018, 9:25 AM Pager: [phone]

## 2018-01-26 NOTE — Progress Notes (Signed)
Physical Therapy Treatment Patient Details Name: Justin Blackburn. MRN: [id number] DOB: 1950-12-12 Today's Date: 01/26/2018    History of Present Illness Pt is a 67 y.o. male with PMH of CAD (s/p CABG, stents), HTN, esophageal adenocarcinoma (stage T3) s/p chemo/radiation, PAD, a-flutter/a-fib, DM2, and recent LLE DVT (12/07/17), admitted 01/14/18 after fall. Worked up for CHF exacerbation and hypoxemia. Pt with worsening respiratory status on 8/5 requiring continuous BiPAP and transfer to ICU.     PT Comments    Pt with improved ambulation tolerance however remains unable to maintain SpO2 >88% on 3LO2 via [place] during ambulation. Pt required 8-10L02. Acute PT to cont to follow.   Follow Up Recommendations  SNF;Supervision/Assistance - 24 hour     Equipment Recommendations  None recommended by PT    Recommendations for Other Services       Precautions / Restrictions Precautions Precautions: Fall Precaution Comments: watch SPo2 at >95% on 3lO2 at rest, drops to low 70s during amb Restrictions Weight Bearing Restrictions: No    Mobility  Bed Mobility Overal bed mobility: Needs Assistance Bed Mobility: Supine to Sit     Supine to sit: Supervision     General bed mobility comments: HOB elevated, increased time, no physical assist needed  Transfers Overall transfer level: Needs assistance Equipment used: Rolling walker (2 wheeled) Transfers: Sit to/from Stand Sit to Stand: Min assist         General transfer comment: v/c's for hand placement, minA for initial power up and stability during transition of hands from bed to RW  Ambulation/Gait Ambulation/Gait assistance: Min guard Gait Distance (Feet): 84 Feet Assistive device: Rolling walker (2 wheeled) Gait Pattern/deviations: Step-through pattern;Decreased stride length;Trunk flexed Gait velocity: slo   General Gait Details: v/c's to look up and ahead, achieve more erect posture. at 41' pt reports "I think I"m reaching my  limit" Pt SpO2 dec to low 70s on 3LO2 via [place]. O2 increased to 10 LO2 for ambulattion back to room, SPO2 in high 70s/low 80s.    Stairs             Wheelchair Mobility    Modified Rankin (Stroke Patients Only)       Balance Overall balance assessment: Needs assistance Sitting-balance support: Feet supported Sitting balance-Leahy Scale: Good     Standing balance support: Bilateral upper extremity supported Standing balance-Leahy Scale: Fair                              Cognition Arousal/Alertness: Awake/alert Behavior During Therapy: WFL for tasks assessed/performed Overall Cognitive Status: Within Functional Limits for tasks assessed                                        Exercises      General Comments General comments (skin integrity, edema, etc.): RN came to room. Pt recovered to high 80s s/p 4-5 minutes of rest. RN place O2 sensor on pt forehead and SpO2 was 90-92. RN decresaed SPO2 to 5LO2, spO2 >88%      Pertinent Vitals/Pain Pain Assessment: No/denies pain    Home Living                      Prior Function            PT Goals (current goals can now be found in the care  plan section) Acute Rehab PT Goals Patient Stated Goal: to get stronger Progress towards PT goals: Progressing toward goals    Frequency    Min 3X/week      PT Plan Current plan remains appropriate    Co-evaluation              AM-PAC PT "6 Clicks" Daily Activity  Outcome Measure  Difficulty turning over in bed (including adjusting bedclothes, sheets and blankets)?: A Little Difficulty moving from lying on back to sitting on the side of the bed? : A Little Difficulty sitting down on and standing up from a chair with arms (e.g., wheelchair, bedside commode, etc,.)?: Unable Help needed moving to and from a bed to chair (including a wheelchair)?: A Little Help needed walking in hospital room?: A Little Help needed climbing 3-5  steps with a railing? : A Lot 6 Click Score: 15    End of Session Equipment Utilized During Treatment: Gait belt;Oxygen Activity Tolerance: Patient tolerated treatment well Patient left: in chair;with call bell/phone within reach;with family/visitor present Nurse Communication: Mobility status PT Visit Diagnosis: Other abnormalities of gait and mobility (R26.89);Muscle weakness (generalized) (M62.81);Difficulty in walking, not elsewhere classified (R26.2)     Time: 7342-8768 PT Time Calculation (min) (ACUTE ONLY): 23 min  Charges:  $Gait Training: 23-37 mins                     Kittie Plater, PT, DPT Pager #: [phone] Office #: [phone]    [place] 01/26/2018, 10:14 AM

## 2018-01-26 NOTE — Progress Notes (Signed)
Medicine attending: I personally examined this patient today together with resident physician Dr Francesco Runner and I concur with her evaluation and management plan. No acute changes overnight.  Stable oxygenation.  We will begin to taper steroids down to 40 mg of prednisone daily.  He may require a prolonged course of steroids in view of potential amiodarone toxicity.  We will try to arrange for pulmonary medicine follow-up after discharge to help direct therapy.  No other outstanding issues.  He will also follow-up with cardiology and his primary care physician. Discharge pending insurance approval for [facility] extended care facility.

## 2018-01-27 DIAGNOSIS — E0965 Drug or chemical induced diabetes mellitus with hyperglycemia: Secondary | ICD-10-CM

## 2018-01-27 DIAGNOSIS — Z6823 Body mass index (BMI) 23.0-23.9, adult: Secondary | ICD-10-CM

## 2018-01-27 DIAGNOSIS — T380X5D Adverse effect of glucocorticoids and synthetic analogues, subsequent encounter: Secondary | ICD-10-CM

## 2018-01-27 DIAGNOSIS — Z7952 Long term (current) use of systemic steroids: Secondary | ICD-10-CM

## 2018-01-27 LAB — GLUCOSE, CAPILLARY
GLUCOSE-CAPILLARY: 110 mg/dL — AB (ref 70–99)
GLUCOSE-CAPILLARY: 286 mg/dL — AB (ref 70–99)

## 2018-01-27 MED ORDER — PREDNISONE 10 MG PO TABS: ORAL_TABLET | ORAL | 0 refills | Status: AC

## 2018-01-27 MED ORDER — FUROSEMIDE 40 MG PO TABS: 40.0000 mg | ORAL_TABLET | Freq: Two times a day (BID) | ORAL | 3 refills | Status: DC

## 2018-01-27 NOTE — Progress Notes (Signed)
Occupational Therapy Treatment Patient Details Name: Justin Blackburn. MRN: [id number] DOB: 06-Nov-1950 Today's Date: 01/27/2018    History of present illness Pt is a 67 y.o. male with PMH of CAD (s/p CABG, stents), HTN, esophageal adenocarcinoma (stage T3) s/p chemo/radiation, PAD, a-flutter/a-fib, DM2, and recent LLE DVT (12/07/17), admitted 01/14/18 after fall. Worked up for CHF exacerbation and hypoxemia. Pt with worsening respiratory status on 8/5 requiring continuous BiPAP and transfer to ICU.    OT comments  Completed education regarding energy conservation and reducing risk of falls. Written information given. Recommend use of Rollator to increase independence with ADL and mobility. Recommend follow up with [facility].   Follow Up Recommendations  Home health OT;Supervision/Assistance - 24 hour(with mobility and ADL)    Equipment Recommendations  Other (comment)(rollator)    Recommendations for Other Services      Precautions / Restrictions Precautions Precautions: Fall Precaution Comments: watchO2 Sats       Mobility Bed Mobility   Bed Mobility: Supine to Sit;Sit to Supine     Supine to sit: Supervision Sit to supine: Supervision      Transfers Overall transfer level: Needs assistance Equipment used: Rolling walker (2 wheeled) Transfers: Sit to/from Stand Sit to Stand: Supervision              Balance                                           ADL either performed or assessed with clinical judgement   ADL                                       Functional mobility during ADLs: Supervision/safety;Rolling walker General ADL Comments: fatigues  easily with ADL tasks; wife is able to assist with LB ADL as needed; recommend use of rollator; Educated wife/pt on energy conservation and fall prevention     Vision       Perception     Praxis      Cognition Arousal/Alertness: Awake/alert Behavior During Therapy: WFL for tasks  assessed/performed Overall Cognitive Status: Within Functional Limits for tasks assessed                                          Exercises     Shoulder Instructions       General Comments      Pertinent Vitals/ Pain       Pain Assessment: No/denies pain  Home Living                                          Prior Functioning/Environment              Frequency  Min 2X/week        Progress Toward Goals  OT Goals(current goals can now be found in the care plan section)  Progress towards OT goals: Progressing toward goals  Acute Rehab OT Goals Patient Stated Goal: to get stronger OT Goal Formulation: With patient/family Time For Goal Achievement: 02/01/18 Potential to Achieve Goals: Good ADL Goals Pt Will Perform Grooming: with set-up;standing Pt Will  Perform Upper Body Bathing: with set-up;sitting Pt Will Perform Lower Body Bathing: with set-up;with supervision;sit to/from stand Pt Will Transfer to Toilet: with supervision;bedside commode;ambulating Pt Will Perform Toileting - Clothing Manipulation and hygiene: with supervision  Plan Discharge plan needs to be updated    Co-evaluation                 AM-PAC PT "6 Clicks" Daily Activity     Outcome Measure   Help from another person eating meals?: None Help from another person taking care of personal grooming?: A Little Help from another person toileting, which includes using toliet, bedpan, or urinal?: A Little Help from another person bathing (including washing, rinsing, drying)?: A Little Help from another person to put on and taking off regular upper body clothing?: A Little Help from another person to put on and taking off regular lower body clothing?: A Little 6 Click Score: 19    End of Session Equipment Utilized During Treatment: Oxygen  OT Visit Diagnosis: Other abnormalities of gait and mobility (R26.89);Muscle weakness (generalized) (M62.81)    Activity Tolerance Patient tolerated treatment well   Patient Left in bed;with call bell/phone within reach;with family/visitor present   Nurse Communication Mobility status;Other (comment)(recommend rollator)        Time: 0881-1031 OT Time Calculation (min): 17 min  Charges: OT General Charges $OT Visit: 1 Visit OT Treatments $Self Care/Home Management : 8-22 mins  Maurie Boettcher, OT/L  OT Clinical Specialist [phone]    [facility] 01/27/2018, 1:38 PM

## 2018-01-27 NOTE — Care Management Important Message (Signed)
Important Message  Patient Details  Name: Justin Blackburn. MRN: [id number] Date of Birth: 05-15-1951   Medicare Important Message Given:  Yes    [name] 01/27/2018, 4:01 PM

## 2018-01-27 NOTE — Progress Notes (Signed)
Attending physician: I personally examined this patient on the day of planned discharge and I attest to the accuracy of the discharge evaluation and plan as recorded in the final progress note and the discharge summary by resident physician Dr Francesco Runner.  After waiting for over 72 hours to get insurance approval for requested extended care facility patient and wife are understandably frustrated and would like to take him home.  We will arrange for home physical therapy.  Home oxygen.  Any durable medical equipment that he needs. He has had stable oxygenation now for the last 4 days. We fully expect that he will have compromised physiologic reserve and fall in his oxygen saturation when he ambulates given severe lung injury. He is able to ambulate when he takes his time on oxygen with no extreme distress. We feel he is stable for discharge home with above services.

## 2018-01-27 NOTE — Progress Notes (Signed)
Subjective: [name]. Remains on 3L [place]. Mr. Turgeon received a lower dose of steroids yesterday and continues to feel fine. He reports getting up to the bathroom and walking down the hall. His wife is at bedside and would like to take him home instead of waiting on SNF. She feels that he can get better care at home and Ms. Niznik feels he will be more motivated if at home. We can arrange for home health PT/OT and home oxygen and work to discharge him later today. He has f/u with cardiology, pulmonology, and his primary care doctor. We discussed that he would continue taking Prednisone at home and I will give him instructions for dosing.    Objective:  Vital signs in last 24 hours: Vitals:   01/27/18 0552 01/27/18 0600 01/27/18 0700 01/27/18 0756  BP:    126/61  Pulse:  61 62 80  Resp:  (!) 30 (!) 21 19  Temp:    98 F (36.7 C)  TempSrc:    Oral  SpO2:  95% 98% 95%  Weight: 77.6 kg     Height:       General: chronically ill appearing, no acute distress  Cardiac: regular rate and rhythm, no murmurs appreciated  Pulm: coarse dry crackles in bilateral bases GI: abdomen soft, non tender, non distended  Ext: 1+ lower extremity edema on the left ankle, no calf tenderness. Right leg no edema Skin: area of erythema over sacrum with small open blister  Assessment/Plan:  Mr. Ravenscroft is a 67 yo male with history ofchronic combinedCHF secondary to ischemic cardiomyopathywithDESto the LADand LCx in Feb 2019 and s/p CABG x2, esophogeal adenocarcinoma s/p chemoradiation, afib/flutter who presented with hypoxemia to 66% on RA and was admitted for volume overload and hypoxic respiratory failure.  #Acute hypoxic respiratory failure:  was set at 3lpm, sats maintained > 90% at rest and with movement. Differential includes amiodarone pulmonary toxicity vs. Radiation pneumonitis or radiation induced scaring vs pneumonia.  - wean O2 as tolerated -Lasix 40mg  po BID. If this is ineffective. Consider  switching to Torsemide 40mg  BID - s/p Azithromycin (8/5 - 8/9) s/p Cefepime (8/4 - 8/8)  - continue po prednisone. Down to 40mg . Slow taper. If respiratory status declines, increase prednisone to previous dose. This tapering process could take anywhere from 2-12 months to complete.  - Continue holding amiodarone. Respiratory status seemed to improve with discontinuation of amiodarone and initiation of high dose steroids. Although, he was only taking Amiodarone since December 07, 2017 and cardiology does not feel this is long enough to cause pulmonary toxicity. Regardless, steroids are the treatment for radiation pneumonitis and amiodarone toxicity.  - BCx negative - If fevers, add back azithromycin and vancomycin and redraw blood cultures  #Acute on chronic combined systolic and diastolic congestive heart failure secondary to ischemic cardiomyopathy #Acute pulmonary edema ([place]): Diuresis has stabilized.BUN and creatine remain at baseline - continue po lasix 40mg  BID - Replete K+ and Mg+ as needed - continue home coreg, plavix, and statin  -not onACE/ ARBdue to orthostatic hypotension in the past, we should resume this at a very low dose when he is stable. Holding home candesartan. - cardiology is on board, we appreciate their assistance in the care of this patient.   #Type 2 NSTEMI - troponin peaked at 0.10  - type 2 and secondary to demand ischemia from volume overload  - remains chest pain free   #Atrial fibrillation, paroxysmal (HCC) - sinus rhythm  rate 67 - Denies  chest pain or palpitations - Discontinued amiodarone due to possibility of pulmonary toxicity - Continue carvedilol 3.125 twice daily for rate control -Continue Eliquis for stroke prophylaxis and DVT as noted below -Continue electrolyte and telemetry monitoring  #Microcytic anemia  - hemoglobin remains stable at 9-10 - continue daily iron supplementation  #History of DVT (deep vein thrombosis) - continue  eliquis   #Diabetes mellitus type 2, insulin dependent ([place]):  - Blood glucose 125-231over the past 24 hours. Increased with po steroids - lantus 20 units  - Novolog 12 units with meals  - Continue blood glucose monitoring and SSI  #Pressure injury of skin: Stage 1 Sacrum Malnutrition of moderate degree -Dietitian following, we appreciate their assistance in the care of this patient  Dispo: Anticipated discharge in approximately 1 day(s) to home  [name], MD 01/27/2018, 8:59 AM Pager: [phone]

## 2018-01-27 NOTE — Consult Note (Signed)
   [facility] CM Inpatient Consult   01/27/2018  Kerri Asche [birth year] [id number]     Patient screened for [place] Management services due to multiple hospitalizations.   Went to bedside to speak with Mr. Harrel and wife about [place] Management program services. Both patient and wife pleasantly decline [facility] Care Management. Mrs. Mera states her son and daughter in law are both EMTs. Therefore, she does not think patient needs any additional follow up. However, agreeable to General EMMI post discharge call.    Provided [facility] Care Management brochure with contact information and 24-hr nurse advice line magnet.  Will make inpatient RNCM aware that [place] Management services were declined.   Will request General EMMI discharge call upon hospital discharge.    Marthenia Rolling, MSN-Ed, RN,BSN [facility] Liaison [phone]

## 2018-01-27 NOTE — Progress Notes (Signed)
Pt had 16 runs SVT at 1224. Pt didn't have any symptoms. IMTS team notified this matter. Removed PIV access and patient & wife received discharge instructions. Pt had rolling walker and O2 tanks. Pt's wife took his belongings. HS [employer]

## 2018-01-27 NOTE — Care Management Note (Signed)
Case Management Note  Patient Details  Name: Justin Blackburn. MRN: [id number] Date of Birth: 26-Oct-1950  Subjective/Objective:   From home presents with  Acute hypoxic resp failiure, chf, L DVT, , Afib, hypokalemia,  Suspect pna.               Action/Plan: NCM will follow for transition of care needs.   Expected Discharge Date:  01/27/18               Expected Discharge Plan:  [place]  In-House Referral:     Discharge planning Services  CM Consult  Post Acute Care Choice:    Choice offered to:     DME Arranged:  Oxygen, Rollator DME Agency:  [place] Arranged:  RN, PT, OT, Social Work, Refused SNF [place] Agency:  [place]  Status of Service:  In process, will continue to follow  If discussed at Long Length of Stay Meetings, dates discussed:    Additional Comments: 01/27/2018  CM informed by pt/wife they are now refusing SNF and plan to discharge home with home health.  CM discussed the recommendation of SNF at length with pt and wife - both are admanant post speaking with attending group that pt will recover better at home with home health.  Neither pt nor wife want to wait for insurance auth as they are determined to take pt home.  CM also discussed discharge plan with attending service per respiratory needs.  Per attending service; pt is safe to discharge home on 5 liters normal flow [place] (high flow oxygen is not needed and pt deemed not appropriate by team for home oxymizer).  Wife is eager to take pt home, [place] choice given-  [facility] chosen for both DME and [facility] - agency accepted both referrals.  Pt has rolling walker and cane in the home.  Pt confirmed he has a PCP and denied barriers with obtaining and paying for medications.    01/26/18 SNF recommended - CSW working placement.  Per CSW barrier is awaiting insurance Abigail Miyamoto, [place] 01/27/2018, 2:27 PM

## 2018-01-27 NOTE — Progress Notes (Signed)
SATURATION QUALIFICATIONS: (This note is used to comply with regulatory documentation for home oxygen)  Patient Saturations on Room Air at Rest = 88%  Patient Saturations on Room Air while Ambulating = 76%  Patient Saturations on 5 Liters of oxygen while Ambulating = 89-90%  Please briefly explain why patient needs home oxygen:

## 2018-01-28 DIAGNOSIS — E1151 Type 2 diabetes mellitus with diabetic peripheral angiopathy without gangrene: Secondary | ICD-10-CM | POA: Diagnosis not present

## 2018-01-28 DIAGNOSIS — I110 Hypertensive heart disease with heart failure: Secondary | ICD-10-CM | POA: Diagnosis not present

## 2018-01-28 DIAGNOSIS — I255 Ischemic cardiomyopathy: Secondary | ICD-10-CM | POA: Diagnosis not present

## 2018-01-28 DIAGNOSIS — I5022 Chronic systolic (congestive) heart failure: Secondary | ICD-10-CM | POA: Diagnosis not present

## 2018-01-28 DIAGNOSIS — I2510 Atherosclerotic heart disease of native coronary artery without angina pectoris: Secondary | ICD-10-CM | POA: Diagnosis not present

## 2018-01-28 DIAGNOSIS — M545 Low back pain: Secondary | ICD-10-CM | POA: Diagnosis not present

## 2018-01-28 DIAGNOSIS — I21A1 Myocardial infarction type 2: Secondary | ICD-10-CM | POA: Diagnosis not present

## 2018-01-28 DIAGNOSIS — C155 Malignant neoplasm of lower third of esophagus: Secondary | ICD-10-CM | POA: Diagnosis not present

## 2018-01-28 DIAGNOSIS — I480 Paroxysmal atrial fibrillation: Secondary | ICD-10-CM | POA: Diagnosis not present

## 2018-01-28 DIAGNOSIS — I82402 Acute embolism and thrombosis of unspecified deep veins of left lower extremity: Secondary | ICD-10-CM | POA: Diagnosis not present

## 2018-01-31 ENCOUNTER — Telehealth: Payer: Self-pay | Admitting: Radiation Oncology

## 2018-01-31 NOTE — Telephone Encounter (Signed)
I spoke with the patient's wife and expressed concern for all he's recently been going through. He's being treated with prednisone for pneumonitis vs. Amiodarone toxicity. It's difficult to say which has caused this but we agree with the prednisone dosing. His esophagitis has improved, but since May he's lost about 80 pounds. We will follow along expectantly and he will see Dr. Servando Snare, but his wife is planning to reschedule this since he just got out of the hospital last week. I will follow up in the next month in his chart to see how he progresses. I encouraged his wife to call us if she needs help getting any of his care coordinated or if they have questions regarding his prior treatment.

## 2018-02-01 ENCOUNTER — Ambulatory Visit: Payer: Medicare HMO | Admitting: Interventional Cardiology

## 2018-02-03 ENCOUNTER — Encounter: Payer: Medicare HMO | Admitting: Cardiothoracic Surgery

## 2018-02-10 DIAGNOSIS — I5042 Chronic combined systolic (congestive) and diastolic (congestive) heart failure: Secondary | ICD-10-CM | POA: Diagnosis not present

## 2018-02-10 DIAGNOSIS — Z79899 Other long term (current) drug therapy: Secondary | ICD-10-CM | POA: Diagnosis not present

## 2018-02-10 DIAGNOSIS — I82409 Acute embolism and thrombosis of unspecified deep veins of unspecified lower extremity: Secondary | ICD-10-CM | POA: Diagnosis not present

## 2018-02-10 DIAGNOSIS — Z6824 Body mass index (BMI) 24.0-24.9, adult: Secondary | ICD-10-CM | POA: Diagnosis not present

## 2018-02-10 DIAGNOSIS — E1140 Type 2 diabetes mellitus with diabetic neuropathy, unspecified: Secondary | ICD-10-CM | POA: Diagnosis not present

## 2018-02-10 DIAGNOSIS — I480 Paroxysmal atrial fibrillation: Secondary | ICD-10-CM | POA: Diagnosis not present

## 2018-02-10 DIAGNOSIS — J80 Acute respiratory distress syndrome: Secondary | ICD-10-CM | POA: Diagnosis not present

## 2018-02-10 DIAGNOSIS — C160 Malignant neoplasm of cardia: Secondary | ICD-10-CM | POA: Diagnosis not present

## 2018-02-15 ENCOUNTER — Ambulatory Visit: Payer: Medicare HMO | Admitting: Pulmonary Disease

## 2018-02-15 ENCOUNTER — Encounter: Payer: Self-pay | Admitting: Pulmonary Disease

## 2018-02-15 VITALS — BP 106/72 | HR 133 | Ht 70.0 in | Wt 184.0 lb

## 2018-02-15 DIAGNOSIS — J810 Acute pulmonary edema: Secondary | ICD-10-CM | POA: Diagnosis not present

## 2018-02-15 MED ORDER — NITROGLYCERIN 0.4 MG SL SUBL: 0.4000 mg | SUBLINGUAL_TABLET | SUBLINGUAL | 0 refills | Status: DC | PRN

## 2018-02-15 NOTE — Patient Instructions (Addendum)
Recent hospitalization for compensated congestive heart failure  Esophageal cancer, status post radiation treatment  hypoxemic respiratory failure  Pulmonary hypertension  Shortness of breath  Regular exercises, continue with therapy  We will obtain a CT scan of the chest in about 4 weeks  I will see you back in the office in 6 to 8 weeks  I will suggest that we get you much stronger prior to any invasive intervention   Importance of regular exercises was stressed  Continue oxygen use, use your pulse oximeter as a guide to get time of oxygen supplementation as needed, I will suggest you continue to use your oxygen at present

## 2018-02-15 NOTE — Progress Notes (Signed)
Justin Blackburn    [id number]    1950/12/12  Primary Care Physician:Conroy, Tamala Julian  Referring Physician: [name], PA-C [place], [place] [address]  Chief complaint:   Shortness of breath Recent hospitalization for congestive heart failure  HPI: He describes improvement in his symptoms Able to tolerate more activities  Oxygen supplementation Denies any chest pains or chest discomfort Has a therapist coming home to help him  Exposures: Smoking history: Quit smoking over 40 years ago  Outpatient Encounter Medications as of 02/15/2018  Medication Sig  . acetaminophen (TYLENOL) 325 MG tablet Take 2 tablets (650 mg total) by mouth every 6 (six) hours as needed for mild pain or headache.  [name] apixaban (ELIQUIS) 5 MG TABS tablet Take 5 mg by mouth 2 (two) times daily.  [name] atorvastatin (LIPITOR) 40 MG tablet Take 40 mg by mouth daily at 6 PM.   . carvedilol (COREG) 3.125 MG tablet Take 1 tablet (3.125 mg total) by mouth 2 (two) times daily with a meal. (Patient taking differently: Take 6.25 mg by mouth 2 (two) times daily with a meal. )  . clopidogrel (PLAVIX) 75 MG tablet Take 1 tablet (75 mg total) by mouth daily. Take for 6 months. Plan to stop on August 27th. Check with cardiologist at that time.  [name] diltiazem (DILACOR XR) 120 MG 24 hr capsule Take 120 mg by mouth daily.  . ferrous sulfate 325 (65 FE) MG tablet Take 325 mg by mouth daily.  . furosemide (LASIX) 40 MG tablet Take 1 tablet (40 mg total) by mouth 2 (two) times daily.  [name] gabapentin (NEURONTIN) 300 MG capsule Take 300 mg by mouth 2 (two) times daily.  . insulin degludec (TRESIBA FLEXTOUCH) 100 UNIT/ML SOPN FlexTouch Pen Inject 85 Units into the skin daily.  . nitroGLYCERIN (NITROSTAT) 0.4 MG SL tablet Place 1 tablet (0.4 mg total) under the tongue every 5 (five) minutes as needed for chest pain (Angina).  . pantoprazole (PROTONIX) 40 MG tablet Take 1 tablet (40 mg total) by mouth daily.  .  predniSONE (DELTASONE) 10 MG tablet Take 4 tablets (40 mg total) by mouth daily with breakfast for 6 days, THEN 2 tablets (20 mg total) daily with breakfast for 7 days, THEN 1 tablet (10 mg total) daily with breakfast for 28 days.  . [DISCONTINUED] nitroGLYCERIN (NITROSTAT) 0.4 MG SL tablet Place 1 tablet (0.4 mg total) under the tongue every 5 (five) minutes as needed for chest pain (Angina).   No facility-administered encounter medications on file as of 02/15/2018.     Allergies as of 02/15/2018  . (No Known Allergies)    Past Medical History:  Diagnosis Date  . A-fib ([place])    with RVR  . Acute kidney injury ([place])   . Anemia   . Angina   . Arthritis    "hands" (08/10/2017)  . Atrial flutter, paroxysmal ([place])    arrived with aflutter with RVR, EF down to 25-30%, s/p TEE DCCV on 01/29/2015  . Cancer ([place])   . CHF (congestive heart failure) ([place])   . Chronic lower back pain    "all the time from my sciatic nerve"  . Coronary artery disease   . DVT (deep venous thrombosis) ([place])   . Dyspnea   . Erectile dysfunction   . Heart failure ([place])   . Hypercholesteremia   . Hypertension   . 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  Myocardial infarction ([place]) 1988; ~3419;  ~1996;~ 2000  . Obesity   . Peripheral vascular disease ([place])   . Umbilical hernia    unrepaired    Past Surgical History:  Procedure Laterality Date  . CARDIOVERSION N/A 01/29/2015   Procedure: CARDIOVERSION;  Surgeon: [name], MD;  Location: [facility];  Service: Cardiovascular;  Laterality: N/A;  . CORONARY ANGIOPLASTY WITH STENT PLACEMENT  08/31/11   "2 today; makes total of 3 stents"  . CORONARY ANGIOPLASTY WITH STENT PLACEMENT  08/10/2017   "2 today; makes a total of 5 stents" (08/10/2017)  . CORONARY ARTERY BYPASS GRAFT  1998; 2007   CABG X 4; CABG X4  . CORONARY STENT INTERVENTION N/A 08/10/2017   Procedure: CORONARY STENT INTERVENTION;  Surgeon: Sherren Mocha, MD;  Location:  [facility];  Service: Cardiovascular;  Laterality: N/A;  . ESOPHAGOGASTRODUODENOSCOPY (EGD) WITH PROPOFOL N/A 09/01/2017   Procedure: ESOPHAGOGASTRODUODENOSCOPY (EGD) WITH PROPOFOL;  Surgeon: Laurence Spates, MD;  Location: [facility];  Service: Endoscopy;  Laterality: N/A;  . EUS N/A 09/29/2017   Procedure: UPPER ENDOSCOPIC ULTRASOUND (EUS) LINEAR;  Surgeon: Arta Silence, MD;  Location: [facility];  Service: Endoscopy;  Laterality: N/A;  . LEFT HEART CATH AND CORS/GRAFTS ANGIOGRAPHY N/A 08/10/2017   Procedure: LEFT HEART CATH AND CORS/GRAFTS ANGIOGRAPHY;  Surgeon: Sherren Mocha, MD;  Location: [facility];  Service: Cardiovascular;  Laterality: N/A;  . LEFT HEART CATHETERIZATION WITH CORONARY/GRAFT ANGIOGRAM N/A 08/31/2011   Procedure: LEFT HEART CATHETERIZATION WITH Beatrix Fetters;  Surgeon: [name], MD;  Location: [facility] C[facility] LAB;  Service: Cardiovascular;  Laterality: N/A;  . PERIPHERAL VASCULAR CATHETERIZATION N/A 04/27/2016   Procedure: Abdominal Aortogram w/Lower Extremity;  Surgeon: Angelia Mould, MD;  Location: [facility];  Service: Cardiovascular;  Laterality: N/A;  . TEE WITHOUT CARDIOVERSION N/A 01/29/2015   Procedure: TRANSESOPHAGEAL ECHOCARDIOGRAM (TEE);  Surgeon: [name], MD;  Location: [facility] ENDOSCOPY;  Service: Cardiovascular;  Laterality: N/A;    Family History  Problem Relation Age of Onset  . Heart disease Mother   . Heart attack Mother   . Diabetes Mother   . Heart disease Father   . Heart attack Father   . Diabetes Father   . Heart attack Brother   . Stroke Neg Hx     Social History   Socioeconomic History  . Marital status: Married    Spouse name: Not on file  . Number of children: Not on file  . Years of education: Not on file  . Highest education level: Not on file  Occupational History  . Not on file  Social Needs  . Financial resource strain: Not on file  . Food insecurity:    Worry: Not on file     Inability: Not on file  . Transportation needs:    Medical: Not on file    Non-medical: Not on file  Tobacco Use  . Smoking status: Former Smoker    Packs/day: 2.00    Years: 6.00    Pack years: 12.00    Types: Cigarettes    Last attempt to quit: 06/16/1971    Years since quitting: 46.7  . Smokeless tobacco: Never Used  . Tobacco comment: "chewed on cigars; never really did chew tobacco"  Substance and Sexual Activity  . Alcohol use: No  . Drug use: No  . Sexual activity: Never  Lifestyle  . Physical activity:    Days per week: Not on file    Minutes per session:  Not on file  . Stress: Not on file  Relationships  . Social connections:    Talks on phone: Not on file    Gets together: Not on file    Attends religious service: Not on file    Active member of club or organization: Not on file    Attends meetings of clubs or organizations: Not on file    Relationship status: Not on file  . Intimate partner violence:    Fear of current or ex partner: Not on file    Emotionally abused: Not on file    Physically abused: Not on file    Forced sexual activity: Not on file  Other Topics Concern  . Not on file  Social History Narrative  . Not on file    Review of Systems  Constitutional: Positive for activity change and fatigue.  Eyes: Negative.   Respiratory: Positive for shortness of breath. Negative for wheezing.   Cardiovascular: Positive for leg swelling. Negative for chest pain.  Endocrine: Negative.   Genitourinary: Negative.   Musculoskeletal: Negative.   Skin: Negative.   Allergic/Immunologic: Negative.   Neurological: Negative.   Psychiatric/Behavioral: Negative.     Vitals:   02/15/18 1011  BP: 106/72  Pulse: (!) 133  SpO2: 98%     Physical Exam  Constitutional: He is oriented to person, place, and time. He appears well-developed and well-nourished.  HENT:  Head: Normocephalic and atraumatic.  Eyes: Pupils are equal, round, and reactive to light.  Conjunctivae are normal. Right eye exhibits no discharge. Left eye exhibits no discharge.  Neck: Normal range of motion. Neck supple. No thyromegaly present.  Cardiovascular: Normal rate and normal heart sounds.  Pulmonary/Chest: Effort normal and breath sounds normal. No respiratory distress. He has no wheezes.  Abdominal: Soft. Bowel sounds are normal. He exhibits no distension. There is no tenderness.  Musculoskeletal: Normal range of motion. He exhibits edema.  Neurological: He is alert and oriented to person, place, and time. He has normal reflexes. No cranial nerve deficit.  Skin: Skin is warm and dry. No erythema.  Psychiatric: He has a normal mood and affect. His behavior is normal.   Data Reviewed:  Recent chest x-ray reviewed CT scan of the chest from 01/16/2018 reviewed  Assessment:   .  Hypoxemic respiratory failure  .  Deconditioning  .  Shortness of breath  .  Decompensated congestive heart failure-diastolic and systolic  .  Pleural effusions  .  Esophageal cancer stage III status post radiation treatment and chemotherapy with Aloxi, carboplatin and so  Plan/Recommendations:  Recommend to continue oxygen supplementation at the present time  Importance of regular exercises discussed with the patient He does have a therapist coming to his home to help him with therapy at present  We will obtain a repeat CT scan of the chest in about 4 weeks to follow-up on recent findings.  Encouraged to continue oxygen use around-the-clock and use his pulse ox to guide him with respect to times when he may be able to be off oxygen at rest   He seems to be making significant strides with respect to his strength and energy level, encouraged to continue to stay active  We will see him back in about 2 months  Sherrilyn Rist MD [facility] 02/15/2018, 10:56 AM  CC: [name], PA-C

## 2018-02-22 ENCOUNTER — Inpatient Hospital Stay: Payer: Medicare HMO | Admitting: Emergency Medicine

## 2018-02-23 ENCOUNTER — Other Ambulatory Visit: Payer: Self-pay | Admitting: Interventional Cardiology

## 2018-02-27 DIAGNOSIS — R0602 Shortness of breath: Secondary | ICD-10-CM | POA: Diagnosis not present

## 2018-02-27 DIAGNOSIS — R269 Unspecified abnormalities of gait and mobility: Secondary | ICD-10-CM | POA: Diagnosis not present

## 2018-02-27 DIAGNOSIS — I5042 Chronic combined systolic (congestive) and diastolic (congestive) heart failure: Secondary | ICD-10-CM | POA: Diagnosis not present

## 2018-03-02 NOTE — Progress Notes (Signed)
Cardiology Office Note:    Date:  03/03/2018   ID:  Justin Kida., DOB 1950-09-14, MRN [id number]  PCP:  [name], PA-C  Cardiologist:  [name], MD   Referring MD: [facility]   Chief Complaint  Patient presents with  . Coronary Artery Disease  . Congestive Heart Failure    History of Present Illness:    Justin Caseres. is a 67 y.o. male with a hx of  CAD, CABG x2, recent left main and circumflex DES, upper GI bleed related to previously undiagnosed esophageal cancer, chemotherapy, paroxysmal atrial fibrillation, anticoagulation therapy, acute on chronic systolic and diastolic heart failure related to atrial fibrillation, returning after recent admission for GI bleed.  He is now 7 months post left main stent.  He has easy bruisability.  His wife did not accompany him today.  She prepares and dispenses his medications.  Several questions were asked that causes me to have concern that he may be getting 3 apixaban tablets per day, losartan, and amiodarone.  He should not be on amiodarone or losartan.  Apixaban should be 5 mg twice a day.  His wife will call and give [place] further information.  He has not noted blood in his stool or urine.  He has been having some shoulder discomfort.  Feels like angina.  No chest discomfort.  Denies orthopnea.  Not requiring continuous oxygen.  O2 saturations have been running greater than 95.  3-week history of progressive left lower extremity swelling.  None in the right lower extremity.  Past Medical History:  Diagnosis Date  . A-fib ([place])    with RVR  . Acute kidney injury ([place])   . Anemia   . Angina   . Arthritis    "hands" (08/10/2017)  . Atrial flutter, paroxysmal ([place])    arrived with aflutter with RVR, EF down to 25-30%, s/p TEE DCCV on 01/29/2015  . Cancer ([place])   . CHF (congestive heart failure) ([place])   . Chronic lower back pain    "all the time from my sciatic nerve"  . Coronary artery disease   . DVT  (deep venous thrombosis) ([place])   . Dyspnea   . Erectile dysfunction   . Heart failure ([place])   . Hypercholesteremia   . Hypertension   . 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 Myocardial infarction ([place]) 1988; ~2263;  ~1996;~ 2000  . Obesity   . Peripheral vascular disease ([place])   . Umbilical hernia    unrepaired    Past Surgical History:  Procedure Laterality Date  . CARDIOVERSION N/A 01/29/2015   Procedure: CARDIOVERSION;  Surgeon: [name], MD;  Location: [facility];  Service: Cardiovascular;  Laterality: N/A;  . CORONARY ANGIOPLASTY WITH STENT PLACEMENT  08/31/11   "2 today; makes total of 3 stents"  . CORONARY ANGIOPLASTY WITH STENT PLACEMENT  08/10/2017   "2 today; makes a total of 5 stents" (08/10/2017)  . CORONARY ARTERY BYPASS GRAFT  1998; 2007   CABG X 4; CABG X4  . CORONARY STENT INTERVENTION N/A 08/10/2017   Procedure: CORONARY STENT INTERVENTION;  Surgeon: Sherren Mocha, MD;  Location: [facility];  Service: Cardiovascular;  Laterality: N/A;  . ESOPHAGOGASTRODUODENOSCOPY (EGD) WITH PROPOFOL N/A 09/01/2017   Procedure: ESOPHAGOGASTRODUODENOSCOPY (EGD) WITH PROPOFOL;  Surgeon: Laurence Spates, MD;  Location: [facility];  Service: Endoscopy;  Laterality: N/A;  . EUS N/A 09/29/2017   Procedure: UPPER ENDOSCOPIC ULTRASOUND (EUS) LINEAR;  Surgeon:  Arta Silence, MD;  Location: [facility];  Service: Endoscopy;  Laterality: N/A;  . LEFT HEART CATH AND CORS/GRAFTS ANGIOGRAPHY N/A 08/10/2017   Procedure: LEFT HEART CATH AND CORS/GRAFTS ANGIOGRAPHY;  Surgeon: Sherren Mocha, MD;  Location: [facility];  Service: Cardiovascular;  Laterality: N/A;  . LEFT HEART CATHETERIZATION WITH CORONARY/GRAFT ANGIOGRAM N/A 08/31/2011   Procedure: LEFT HEART CATHETERIZATION WITH Beatrix Fetters;  Surgeon: [name], MD;  Location: [facility] CATH LAB;  Service: Cardiovascular;  Laterality: N/A;  . PERIPHERAL VASCULAR CATHETERIZATION N/A  04/27/2016   Procedure: Abdominal Aortogram w/Lower Extremity;  Surgeon: Angelia Mould, MD;  Location: [place] [facility];  Service: Cardiovascular;  Laterality: N/A;  . TEE WITHOUT CARDIOVERSION N/A 01/29/2015   Procedure: TRANSESOPHAGEAL ECHOCARDIOGRAM (TEE);  Surgeon: [name], MD;  Location: [facility] ENDOSCOPY;  Service: Cardiovascular;  Laterality: N/A;    Current Medications: Current Meds  Medication Sig  . acetaminophen (TYLENOL) 325 MG tablet Take 2 tablets (650 mg total) by mouth every 6 (six) hours as needed for mild pain or headache.  [name] apixaban (ELIQUIS) 5 MG TABS tablet Take 5 mg by mouth 2 (two) times daily.  [name] atorvastatin (LIPITOR) 40 MG tablet Take 40 mg by mouth daily at 6 PM.   . carvedilol (COREG) 6.25 MG tablet Take 6.25 mg by mouth 2 (two) times daily with a meal.  . diltiazem (DILACOR XR) 120 MG 24 hr capsule Take 120 mg by mouth daily.  . ferrous sulfate 325 (65 FE) MG tablet Take 325 mg by mouth daily.  . furosemide (LASIX) 40 MG tablet Take 1 tablet (40 mg total) by mouth 2 (two) times daily.  [name] gabapentin (NEURONTIN) 300 MG capsule Take 300 mg by mouth 2 (two) times daily.  . insulin degludec (TRESIBA FLEXTOUCH) 100 UNIT/ML SOPN FlexTouch Pen Inject 85 Units into the skin daily.  . nitroGLYCERIN (NITROSTAT) 0.4 MG SL tablet Place 1 tablet (0.4 mg total) under the tongue every 5 (five) minutes as needed for chest pain (Angina).  . pantoprazole (PROTONIX) 40 MG tablet Take 1 tablet (40 mg total) by mouth daily.  . predniSONE (DELTASONE) 10 MG tablet Take 4 tablets (40 mg total) by mouth daily with breakfast for 6 days, THEN 2 tablets (20 mg total) daily with breakfast for 7 days, THEN 1 tablet (10 mg total) daily with breakfast for 28 days.  . [DISCONTINUED] clopidogrel (PLAVIX) 75 MG tablet Take 1 tablet (75 mg total) by mouth daily. Take for 6 months. Plan to stop on August 27th. Check with cardiologist at that time.     Allergies:   Patient has no known  allergies.   Social History   Socioeconomic History  . Marital status: Married    Spouse name: Not on file  . Number of children: Not on file  . Years of education: Not on file  . Highest education level: Not on file  Occupational History  . Not on file  Social Needs  . Financial resource strain: Not on file  . Food insecurity:    Worry: Not on file    Inability: Not on file  . Transportation needs:    Medical: Not on file    Non-medical: Not on file  Tobacco Use  . Smoking status: Former Smoker    Packs/day: 2.00    Years: 6.00    Pack years: 12.00    Types: Cigarettes    Last attempt to quit: 06/16/1971    Years since quitting: 46.7  .  Smokeless tobacco: Never Used  . Tobacco comment: "chewed on cigars; never really did chew tobacco"  Substance and Sexual Activity  . Alcohol use: No  . Drug use: No  . Sexual activity: Never  Lifestyle  . Physical activity:    Days per week: Not on file    Minutes per session: Not on file  . Stress: Not on file  Relationships  . Social connections:    Talks on phone: Not on file    Gets together: Not on file    Attends religious service: Not on file    Active member of club or organization: Not on file    Attends meetings of clubs or organizations: Not on file    Relationship status: Not on file  Other Topics Concern  . Not on file  Social History Narrative  . Not on file     Family History: The patient's family history includes Diabetes in his father and mother; Heart attack in his brother, father, and mother; Heart disease in his father and mother. There is no history of Stroke.  ROS:   Please see the history of present illness.    Right greater than left.  Decreased hearing, vision disturbance, progressively improving dyspnea, back pain, muscle pain, dizziness, easy bruising, difficulty with balance.  All other systems reviewed and are negative.  EKGs/Labs/Other Studies Reviewed:    The following studies were reviewed  today: None  EKG:  EKG is not ordered today.    Recent Labs: 01/14/2018: ALT 25; B Natriuretic Peptide 781.3 01/19/2018: TSH 0.111 01/21/2018: Hemoglobin 9.4; Platelets 321 01/23/2018: Magnesium 1.9 01/24/2018: BUN 30; Creatinine, Ser 0.85; Potassium 4.1; Sodium 136  Recent Lipid Panel    Component Value Date/Time   CHOL 149 12/07/2017 0336   CHOL 129 09/21/2017 1352   TRIG 98 12/07/2017 0336   HDL 33 (L) 12/07/2017 0336   HDL 31 (L) 09/21/2017 1352   CHOLHDL 4.5 12/07/2017 0336   VLDL 20 12/07/2017 0336   LDLCALC 96 12/07/2017 0336   LDLCALC 70 09/21/2017 1352    Physical Exam:    VS:  BP 126/70   Pulse (!) 54   Ht 6' (1.829 m)   Wt 202 lb (91.6 kg)   BMI 27.40 kg/m     Wt Readings from Last 3 Encounters:  03/03/18 202 lb (91.6 kg)  02/15/18 184 lb (83.5 kg)  01/27/18 171 lb (77.6 kg)     GEN:  Well nourished, well developed in no acute distress HEENT: Normal NECK: No JVD. LYMPHATICS: No lymphadenopathy CARDIAC: Irregular RRR, no murmur, no gallop, left 2-3+ ankle to mid shin edema.  Edema. VASCULAR: Difficult to palpate and feet.  2+ bilateral radial.  Pulses.  No bruits. RESPIRATORY:  Clear to auscultation with bilateral cellophane-like basilar rales. ABDOMEN: Soft, non-tender, non-distended, No pulsatile mass, MUSCULOSKELETAL: No deformity  SKIN: Warm and dry NEUROLOGIC:  Alert and oriented x 3 PSYCHIATRIC:  Normal affect   ASSESSMENT:    1. Chronic combined systolic and diastolic heart failure, NYHA class 2 (HCC)   2. AF (paroxysmal atrial fibrillation) ([place])   3. Acute deep vein thrombosis (DVT) of proximal vein of left lower extremity ([place])   4. Coronary artery disease involving coronary bypass graft of native heart without angina pectoris   5. Essential hypertension    PLAN:    In order of problems listed above:  1. He has unilateral lower extremity swelling.  Has prior history of DVT in that leg.  Neck veins  are flat.  I do not believe he is volume  overloaded.  A BMP and basic metabolic panel will be obtained today. 2. Clinically in atrial fibrillation with controlled rate.  Continue diltiazem and carvedilol for control. 3. Question about intensity of apixaban therapy.  Could be taking 5 mg/day hopefully he is taking 10 mg/day and there is a chance he could be taking 15 mg/day based on questions concerning his therapy from his wife. 4. Recurring left shoulder discomfort and arm that he equates with the possibility of angina.  Encouraged to use sublingual nitroglycerin and report response.  Discontinue Plavix.  He is 6 months out from left main stent. 5. Adequate current blood pressure.  Verify the medication concerns noted above.  Should be on apixaban 5 mg twice daily.  Will discontinue Plavix.   Medication Adjustments/Labs and Tests Ordered: Current medicines are reviewed at length with the patient today.  Concerns regarding medicines are outlined above.  Orders Placed This Encounter  Procedures  . Basic metabolic panel  . Pro b natriuretic peptide   No orders of the defined types were placed in this encounter.   Patient Instructions  Medication Instructions:  1) DISCONTINUE Plavix  Labwork: BMET and Pro BNP today  Testing/Procedures: None  Follow-Up: Your physician recommends that you schedule a follow-up appointment in: 6 weeks with Dr. Tamala Julian or a APP on his team.   Any Other Special Instructions Will Be Listed Below (If Applicable).  Have your wife contact the office in regards to the questions Dr. Tamala Julian wrote on the medication paperwork we are sending with you.   If you need a refill on your cardiac medications before your next appointment, please call your pharmacy.  Dr. Tamala Julian recommends that you wear a moderate tension support stocking on your left leg.  You can obtain one from a medical supply store,    Signed, [name], MD  03/03/2018 9:47 AM    [place]

## 2018-03-03 ENCOUNTER — Ambulatory Visit: Payer: Medicare HMO | Admitting: Interventional Cardiology

## 2018-03-03 ENCOUNTER — Telehealth: Payer: Self-pay | Admitting: *Deleted

## 2018-03-03 ENCOUNTER — Encounter: Payer: Self-pay | Admitting: Interventional Cardiology

## 2018-03-03 VITALS — BP 126/70 | HR 54 | Ht 72.0 in | Wt 202.0 lb

## 2018-03-03 DIAGNOSIS — I25810 Atherosclerosis of coronary artery bypass graft(s) without angina pectoris: Secondary | ICD-10-CM | POA: Diagnosis not present

## 2018-03-03 DIAGNOSIS — I480 Paroxysmal atrial fibrillation: Secondary | ICD-10-CM | POA: Diagnosis not present

## 2018-03-03 DIAGNOSIS — I5042 Chronic combined systolic (congestive) and diastolic (congestive) heart failure: Secondary | ICD-10-CM | POA: Diagnosis not present

## 2018-03-03 DIAGNOSIS — I10 Essential (primary) hypertension: Secondary | ICD-10-CM

## 2018-03-03 DIAGNOSIS — I824Y2 Acute embolism and thrombosis of unspecified deep veins of left proximal lower extremity: Secondary | ICD-10-CM | POA: Diagnosis not present

## 2018-03-03 DIAGNOSIS — E876 Hypokalemia: Secondary | ICD-10-CM

## 2018-03-03 LAB — BASIC METABOLIC PANEL
BUN / CREAT RATIO: 16 (ref 10–24)
BUN: 15 mg/dL (ref 8–27)
CHLORIDE: 103 mmol/L (ref 96–106)
CO2: 23 mmol/L (ref 20–29)
Calcium: 8.3 mg/dL — ABNORMAL LOW (ref 8.6–10.2)
Creatinine, Ser: 0.94 mg/dL (ref 0.76–1.27)
GFR calc non Af Amer: 82 mL/min/{1.73_m2} (ref >59)
GFR, EST AFRICAN AMERICAN: 95 mL/min/{1.73_m2} (ref >59)
Glucose: 169 mg/dL — ABNORMAL HIGH (ref 65–99)
POTASSIUM: 3.3 mmol/L — AB (ref 3.5–5.2)
Sodium: 143 mmol/L (ref 134–144)

## 2018-03-03 LAB — PRO B NATRIURETIC PEPTIDE: NT-Pro BNP: 1970 pg/mL — ABNORMAL HIGH (ref 0–376)

## 2018-03-03 MED ORDER — POTASSIUM CHLORIDE CRYS ER 20 MEQ PO TBCR: 20.0000 meq | EXTENDED_RELEASE_TABLET | Freq: Every day | ORAL | 3 refills | Status: DC

## 2018-03-03 NOTE — Telephone Encounter (Signed)
-----   Message from Belva Crome, MD sent at 03/03/2018  5:55 PM EDT ----- Let the patient know start potassium 20 mEq/day.  Continue furosemide 40 mg twice daily.  We speak to his wife we need to know if she is giving him losartan?  If she is giving him amiodarone (he should not be taking it)?  We need to know how often/how many apixaban tablets are being given.  He should be on 5 mg twice daily. A copy will be sent to [name], PA-C

## 2018-03-03 NOTE — Patient Instructions (Signed)
Medication Instructions:  1) DISCONTINUE Plavix  Labwork: BMET and Pro BNP today  Testing/Procedures: None  Follow-Up: Your physician recommends that you schedule a follow-up appointment in: 6 weeks with Dr. Tamala Julian or a APP on his team.   Any Other Special Instructions Will Be Listed Below (If Applicable).  Have your wife contact the office in regards to the questions Dr. Tamala Julian wrote on the medication paperwork we are sending with you.   If you need a refill on your cardiac medications before your next appointment, please call your pharmacy.  Dr. Tamala Julian recommends that you wear a moderate tension support stocking on your left leg.  You can obtain one from a medical supply store,

## 2018-03-03 NOTE — Telephone Encounter (Signed)
Spoke with pt and went over results and recommendations per Dr. Tamala Julian.  Pt states that he has not spoken to his wife about the medications yet because she doesn't get off of work until later [employer].  Advised pt to have her call me tomorrow about meds.  Pt verbalized understanding and was in agreement with this plan.

## 2018-03-04 MED ORDER — LOSARTAN POTASSIUM 100 MG PO TABS: 100.0000 mg | ORAL_TABLET | Freq: Every day | ORAL | 3 refills | Status: DC

## 2018-03-04 MED ORDER — AMIODARONE HCL 200 MG PO TABS: 200.0000 mg | ORAL_TABLET | Freq: Every day | ORAL | 3 refills | Status: DC

## 2018-03-04 NOTE — Telephone Encounter (Signed)
Spoke with pt's wife and she states pt has been taking Amiodarone but she will stop it per Dr. Tamala Julian.  Pt has been taking Eliquis BID.  Pt has also been taking the Losartan 100mg  QD.  They need to know if pt should continue Losartan?  Advised I will speak to Dr. Tamala Julian and call back with recommendations.   Spoke with Dr. Tamala Julian and he said to have pt continue Losartan and Amiodarone.  Spoke with pt's wife and made her aware.  Wife verbalized understanding and was in agreement with this plan.

## 2018-03-05 ENCOUNTER — Other Ambulatory Visit: Payer: Self-pay | Admitting: Pulmonary Disease

## 2018-03-14 ENCOUNTER — Other Ambulatory Visit: Payer: Medicare HMO

## 2018-03-15 ENCOUNTER — Ambulatory Visit
Admission: RE | Admit: 2018-03-15 | Discharge: 2018-03-15 | Disposition: A | Discharge status: Home / Self Care | Payer: Medicare HMO | Source: Ambulatory Visit | Attending: Pulmonary Disease | Admitting: Pulmonary Disease

## 2018-03-15 ENCOUNTER — Other Ambulatory Visit: Payer: Medicare HMO

## 2018-03-15 DIAGNOSIS — J90 Pleural effusion, not elsewhere classified: Secondary | ICD-10-CM | POA: Insufficient documentation

## 2018-03-15 DIAGNOSIS — E876 Hypokalemia: Secondary | ICD-10-CM | POA: Diagnosis not present

## 2018-03-15 DIAGNOSIS — J810 Acute pulmonary edema: Secondary | ICD-10-CM | POA: Diagnosis not present

## 2018-03-15 DIAGNOSIS — I700 Atherosclerosis of aorta: Secondary | ICD-10-CM | POA: Diagnosis not present

## 2018-03-15 DIAGNOSIS — I712 Thoracic aortic aneurysm, without rupture: Secondary | ICD-10-CM | POA: Diagnosis not present

## 2018-03-15 DIAGNOSIS — J811 Chronic pulmonary edema: Secondary | ICD-10-CM | POA: Diagnosis not present

## 2018-03-15 LAB — BASIC METABOLIC PANEL
BUN/Creatinine Ratio: 16 (ref 10–24)
BUN: 23 mg/dL (ref 8–27)
CALCIUM: 9 mg/dL (ref 8.6–10.2)
CHLORIDE: 97 mmol/L (ref 96–106)
CO2: 26 mmol/L (ref 20–29)
Creatinine, Ser: 1.44 mg/dL — ABNORMAL HIGH (ref 0.76–1.27)
GFR calc non Af Amer: 49 mL/min/{1.73_m2} — ABNORMAL LOW (ref >59)
GFR, EST AFRICAN AMERICAN: 57 mL/min/{1.73_m2} — AB (ref >59)
GLUCOSE: 221 mg/dL — AB (ref 65–99)
POTASSIUM: 4.3 mmol/L (ref 3.5–5.2)
Sodium: 139 mmol/L (ref 134–144)

## 2018-03-17 ENCOUNTER — Telehealth: Payer: Self-pay | Admitting: Oncology

## 2018-03-17 NOTE — Telephone Encounter (Signed)
Scheduled appt per 10/2 sch message - pt unable to come in 11/22 - scheduled pt for next available that works with pt schedule.

## 2018-03-29 DIAGNOSIS — I5042 Chronic combined systolic (congestive) and diastolic (congestive) heart failure: Secondary | ICD-10-CM | POA: Diagnosis not present

## 2018-03-29 DIAGNOSIS — R0602 Shortness of breath: Secondary | ICD-10-CM | POA: Diagnosis not present

## 2018-03-29 DIAGNOSIS — R269 Unspecified abnormalities of gait and mobility: Secondary | ICD-10-CM | POA: Diagnosis not present

## 2018-03-30 ENCOUNTER — Other Ambulatory Visit: Payer: Self-pay | Admitting: Pulmonary Disease

## 2018-04-02 DIAGNOSIS — R269 Unspecified abnormalities of gait and mobility: Secondary | ICD-10-CM | POA: Diagnosis not present

## 2018-04-02 DIAGNOSIS — I5042 Chronic combined systolic (congestive) and diastolic (congestive) heart failure: Secondary | ICD-10-CM | POA: Diagnosis not present

## 2018-04-02 DIAGNOSIS — R0602 Shortness of breath: Secondary | ICD-10-CM | POA: Diagnosis not present

## 2018-04-06 ENCOUNTER — Ambulatory Visit: Payer: Medicare HMO | Admitting: Pulmonary Disease

## 2018-04-06 ENCOUNTER — Encounter: Payer: Self-pay | Admitting: Pulmonary Disease

## 2018-04-06 VITALS — BP 110/62 | HR 100 | Ht 72.0 in | Wt 187.0 lb

## 2018-04-06 DIAGNOSIS — J810 Acute pulmonary edema: Secondary | ICD-10-CM | POA: Diagnosis not present

## 2018-04-06 NOTE — Patient Instructions (Signed)
Significant improvement in symptoms  Shortness of breath is a lot better Still working on his muscle strength  CT showed significant improvement in pulmonary congestion  Ambulate to check for oxygen need If no desaturations with ambulation then we can discontinue oxygen supplementation  I will see you in the office as needed

## 2018-04-06 NOTE — Progress Notes (Signed)
Justin Blackburn    [id number]    11-19-1950  Primary Care Physician:Conroy, Tamala Julian  Referring Physician: [name], PA-C [place], [place] [address]  Chief complaint:   Recent hospitalization for congestive heart failure Symptoms significantly improved-shortness of breath is a lot better  HPI: He describes improvement in his symptoms Continues to improve Able to tolerate more activities Has not been using oxygen supplementation  Denies any chest pains or chest discomfort  Denies a cough, no chest pains or discomfort Has a therapist coming home to help him  Exposures: Smoking history: Quit smoking over 40 years ago  Outpatient Encounter Medications as of 04/06/2018  Medication Sig  . acetaminophen (TYLENOL) 325 MG tablet Take 2 tablets (650 mg total) by mouth every 6 (six) hours as needed for mild pain or headache.  [name] amiodarone (PACERONE) 200 MG tablet Take 1 tablet (200 mg total) by mouth daily.  [name] apixaban (ELIQUIS) 5 MG TABS tablet Take 5 mg by mouth 2 (two) times daily.  [name] atorvastatin (LIPITOR) 40 MG tablet Take 40 mg by mouth daily at 6 PM.   . carvedilol (COREG) 6.25 MG tablet Take 6.25 mg by mouth 2 (two) times daily with a meal.  . diltiazem (DILACOR XR) 120 MG 24 hr capsule Take 120 mg by mouth daily.  . ferrous sulfate 325 (65 FE) MG tablet Take 325 mg by mouth daily.  . furosemide (LASIX) 40 MG tablet Take 1 tablet (40 mg total) by mouth 2 (two) times daily.  [name] gabapentin (NEURONTIN) 300 MG capsule Take 300 mg by mouth 2 (two) times daily.  . insulin degludec (TRESIBA FLEXTOUCH) 100 UNIT/ML SOPN FlexTouch Pen Inject 85 Units into the skin daily.  [name] losartan (COZAAR) 100 MG tablet Take 1 tablet (100 mg total) by mouth daily.  . nitroGLYCERIN (NITROSTAT) 0.4 MG SL tablet PLACE 1 TABLET UNDER THE TONGUE EVERY 5 (FIVE) MINUTES AS NEEDED FOR CHEST PAIN (ANGINA).  [name] pantoprazole (PROTONIX) 40 MG tablet Take 1 tablet (40 mg total) by  mouth daily.  . potassium chloride SA (K-DUR,KLOR-CON) 20 MEQ tablet Take 1 tablet (20 mEq total) by mouth daily.   No facility-administered encounter medications on file as of 04/06/2018.     Allergies as of 04/06/2018  . (No Known Allergies)    Past Medical History:  Diagnosis Date  . A-fib ([place])    with RVR  . Acute kidney injury ([place])   . Anemia   . Angina   . Arthritis    "hands" (08/10/2017)  . Atrial flutter, paroxysmal ([place])    arrived with aflutter with RVR, EF down to 25-30%, s/p TEE DCCV on 01/29/2015  . Cancer ([place])   . CHF (congestive heart failure) ([place])   . Chronic lower back pain    "all the time from my sciatic nerve"  . Coronary artery disease   . DVT (deep venous thrombosis) ([place])   . Dyspnea   . Erectile dysfunction   . Heart failure ([place])   . Hypercholesteremia   . Hypertension   . 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 Myocardial infarction ([place]) 1988; ~6659;  ~1996;~ 2000  . Obesity   . Peripheral vascular disease ([place])   . Umbilical hernia    unrepaired    Past Surgical History:  Procedure Laterality Date  . CARDIOVERSION N/A 01/29/2015   Procedure: CARDIOVERSION;  Surgeon: [name], MD;  Location: [facility];  Service: Cardiovascular;  Laterality: N/A;  . CORONARY ANGIOPLASTY WITH STENT PLACEMENT  08/31/11   "2 today; makes total of 3 stents"  . CORONARY ANGIOPLASTY WITH STENT PLACEMENT  08/10/2017   "2 today; makes a total of 5 stents" (08/10/2017)  . CORONARY ARTERY BYPASS GRAFT  1998; 2007   CABG X 4; CABG X4  . CORONARY STENT INTERVENTION N/A 08/10/2017   Procedure: CORONARY STENT INTERVENTION;  Surgeon: Sherren Mocha, MD;  Location: [facility];  Service: Cardiovascular;  Laterality: N/A;  . ESOPHAGOGASTRODUODENOSCOPY (EGD) WITH PROPOFOL N/A 09/01/2017   Procedure: ESOPHAGOGASTRODUODENOSCOPY (EGD) WITH PROPOFOL;  Surgeon: Laurence Spates, MD;  Location: [facility];  Service: Endoscopy;  Laterality:  N/A;  . EUS N/A 09/29/2017   Procedure: UPPER ENDOSCOPIC ULTRASOUND (EUS) LINEAR;  Surgeon: Arta Silence, MD;  [facility];  Service: Endoscopy;  Laterality: N/A;  . LEFT HEART CATH AND CORS/GRAFTS ANGIOGRAPHY N/A 08/10/2017   Procedure: LEFT HEART CATH AND CORS/GRAFTS ANGIOGRAPHY;  Surgeon: Sherren Mocha, MD;  Location: [facility];  Service: Cardiovascular;  Laterality: N/A;  . LEFT HEART CATHETERIZATION WITH CORONARY/GRAFT ANGIOGRAM N/A 08/31/2011   Procedure: LEFT HEART CATHETERIZATION WITH Beatrix Fetters;  Surgeon: [name], MD;  [facility] C[facility];  Service: Cardiovascular;  Laterality: N/A;  . PERIPHERAL VASCULAR CATHETERIZATION N/A 04/27/2016   Procedure: Abdominal Aortogram w/Lower Extremity;  Surgeon: Angelia Mould, MD;  Location: [facility];  Service: Cardiovascular;  Laterality: N/A;  . TEE WITHOUT CARDIOVERSION N/A 01/29/2015   Procedure: TRANSESOPHAGEAL ECHOCARDIOGRAM (TEE);  Surgeon: [name], MD;  Location: [facility] ENDOSCOPY;  Service: Cardiovascular;  Laterality: N/A;    Family History  Problem Relation Age of Onset  . Heart disease Mother   . Heart attack Mother   . Diabetes Mother   . Heart disease Father   . Heart attack Father   . Diabetes Father   . Heart attack Brother   . Stroke Neg Hx     Social History   Socioeconomic History  . Marital status: Married    Spouse name: Not on file  . Number of children: Not on file  . Years of education: Not on file  . Highest education level: Not on file  Occupational History  . Not on file  Social Needs  . Financial resource strain: Not on file  . Food insecurity:    Worry: Not on file    Inability: Not on file  . Transportation needs:    Medical: Not on file    Non-medical: Not on file  Tobacco Use  . Smoking status: Former Smoker    Packs/day: 2.00    Years: 6.00    Pack years: 12.00    Types: Cigarettes    Last attempt to quit: 06/16/1971    Years  since quitting: 46.8  . Smokeless tobacco: Never Used  . Tobacco comment: "chewed on cigars; never really did chew tobacco"  Substance and Sexual Activity  . Alcohol use: No  . Drug use: No  . Sexual activity: Never  Lifestyle  . Physical activity:    Days per week: Not on file    Minutes per session: Not on file  . Stress: Not on file  Relationships  . Social connections:    Talks on phone: Not on file    Gets together: Not on file    Attends religious service: Not on file    Active member of club or organization: Not on file  Attends meetings of clubs or organizations: Not on file    Relationship status: Not on file  . Intimate partner violence:    Fear of current or ex partner: Not on file    Emotionally abused: Not on file    Physically abused: Not on file    Forced sexual activity: Not on file  Other Topics Concern  . Not on file  Social History Narrative  . Not on file    Review of Systems  Constitutional: Positive for activity change. Negative for fatigue.  Eyes: Negative.   Respiratory: Negative for shortness of breath and wheezing.        Shortness of breath with significant activity  Cardiovascular: Positive for leg swelling. Negative for chest pain.  Endocrine: Negative.   Genitourinary: Negative.   Musculoskeletal: Negative.   Skin: Negative.   Allergic/Immunologic: Negative.     Vitals:   04/06/18 1121  BP: 110/62  Pulse: 100  SpO2: 97%     Physical Exam  Constitutional: He appears well-developed and well-nourished.  HENT:  Head: Normocephalic and atraumatic.  Eyes: Pupils are equal, round, and reactive to light. Conjunctivae are normal. Right eye exhibits no discharge. Left eye exhibits no discharge.  Neck: Normal range of motion. Neck supple. No thyromegaly present.  Cardiovascular: Normal rate and normal heart sounds.  Pulmonary/Chest: Effort normal. No respiratory distress. He has no wheezes. He has rales.  Abdominal: Soft. Bowel sounds  are normal. He exhibits no distension. There is no tenderness.  Psychiatric: His behavior is normal.   Data Reviewed:  Recent chest x-ray reviewed CT scan of the chest from 01/16/2018 reviewed CT scan from 03/15/2018 reviewed-significant improvement in pulmonary congestion, minimal effusion  Assessment:   .  Hypoxemic respiratory failure -continues to improve  .  Deconditioning -improving but still with significant muscle weakness  .  Shortness of breath  -This is improving  .  Decompensated congestive heart failure-diastolic and systolic  .  Pleural effusions-improving  .  Esophageal cancer stage III status post radiation treatment and chemotherapy with Aloxi, carboplatin  Plan/Recommendations: Importance of regular exercises discussed with the patient-reiterated today  He seems to be making significant strides with respect to his strength and energy level, encouraged to continue to stay active     He ambulated without significant desaturations-lower saturation of 96% Will discontinue oxygen supplementation  We will see him back as needed  Sherrilyn Rist MD [facility] 04/06/2018, 11:52 AM  CC: [name], PA-C

## 2018-04-18 NOTE — Progress Notes (Signed)
CARDIOLOGY OFFICE NOTE  Date:  04/19/2018    Justin Blackburn. Date of Birth: Jul 07, 1950 Medical Record #[id number]  PCP:  [name], PA-C  Cardiologist:  Tamala Julian   Chief Complaint  Patient presents with  . Coronary Artery Disease  . Congestive Heart Failure    6 week check - seen for Dr. Tamala Julian    History of Present Illness: Justin Blackburn. is a 67 y.o. male who presents today for a 6 week check. Seen for Dr. Tamala Julian.   He has a history of CAD with CABG in 1998 and 2007, prior left main and circumflex DES 07/2017, upper GI bleed related to previously undiagnosed esophageal cancer - treated with chemotherapy and radiation, prior cardioversion in 2016 for atrial flutter, paroxysmal atrial fibrillation on chronic anticoagulation therapy, and acute on chronic systolic and diastolic heart failure related to atrial fibrillation. He also has a known thoracic aortic aneursym.   Seen back last month - concern for his medicines. Was probably taking excessive Eliquis. Wife typically prepares and dispenses his medicines. Noted he was NOT to be on amiodarone (concern for prior toxicity following admission in August with respiratory failure) and losartan - but he was taking both after clarification with his wife - the amiodarone was originally going to be stopped but then both were continued per prior phone note and discussion with Dr. Tamala Julian. Had had some swelling - unilateral - advised support stockings.  Plavix was stopped.   Comes in today. Here with his wife. He has been released from pulmonary. Dizzy with walking and feels like he will pass out - no frank syncope. He says this has been going on for a while. No real shortness of breath. No chest pain. He feels like he is getting stronger. Taking his Eliquis - no missed doses. No bleeding. Using support stockings with good results. Weight is up some - but he has been eating better. No active bleeding.   Past Medical History:  Diagnosis Date    . A-fib ([place])    with RVR  . Acute kidney injury ([place])   . Anemia   . Angina   . Arthritis    "hands" (08/10/2017)  . Atrial flutter, paroxysmal ([place])    arrived with aflutter with RVR, EF down to 25-30%, s/p TEE DCCV on 01/29/2015  . Cancer ([place])   . CHF (congestive heart failure) ([place])   . Chronic lower back pain    "all the time from my sciatic nerve"  . Coronary artery disease   . DVT (deep venous thrombosis) ([place])   . Dyspnea   . Erectile dysfunction   . Heart failure ([place])   . Hypercholesteremia   . Hypertension   . 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 Myocardial infarction ([place]) 1988; ~5027;  ~1996;~ 2000  . Obesity   . Peripheral vascular disease ([place])   . Umbilical hernia    unrepaired    Past Surgical History:  Procedure Laterality Date  . CARDIOVERSION N/A 01/29/2015   Procedure: CARDIOVERSION;  Surgeon: [name], MD;  Location: [facility];  Service: Cardiovascular;  Laterality: N/A;  . CORONARY ANGIOPLASTY WITH STENT PLACEMENT  08/31/11   "2 today; makes total of 3 stents"  . CORONARY ANGIOPLASTY WITH STENT PLACEMENT  08/10/2017   "2 today; makes a total of 5 stents" (08/10/2017)  . CORONARY ARTERY BYPASS GRAFT  1998; 2007   CABG X 4; CABG X4  . CORONARY STENT INTERVENTION  N/A 08/10/2017   Procedure: CORONARY STENT INTERVENTION;  Surgeon: Sherren Mocha, MD;  Location: [facility];  Service: Cardiovascular;  Laterality: N/A;  . ESOPHAGOGASTRODUODENOSCOPY (EGD) WITH PROPOFOL N/A 09/01/2017   Procedure: ESOPHAGOGASTRODUODENOSCOPY (EGD) WITH PROPOFOL;  Surgeon: Laurence Spates, MD;  Location: [facility];  Service: Endoscopy;  Laterality: N/A;  . EUS N/A 09/29/2017   Procedure: UPPER ENDOSCOPIC ULTRASOUND (EUS) LINEAR;  Surgeon: Arta Silence, MD;  [facility];  Service: Endoscopy;  Laterality: N/A;  . LEFT HEART CATH AND CORS/GRAFTS ANGIOGRAPHY N/A 08/10/2017   Procedure: LEFT HEART CATH AND CORS/GRAFTS ANGIOGRAPHY;   Surgeon: Sherren Mocha, MD;  Location: [facility];  Service: Cardiovascular;  Laterality: N/A;  . LEFT HEART CATHETERIZATION WITH CORONARY/GRAFT ANGIOGRAM N/A 08/31/2011   Procedure: LEFT HEART CATHETERIZATION WITH Beatrix Fetters;  Surgeon: [name], MD;  Location[facility] CATH LAB;  Service: Cardiovascular;  Laterality: N/A;  . PERIPHERAL VASCULAR CATHETERIZATION N/A 04/27/2016   Procedure: Abdominal Aortogram w/Lower Extremity;  Surgeon: Angelia Mould, MD;  Location: [facility];  Service: Cardiovascular;  Laterality: N/A;  . TEE WITHOUT CARDIOVERSION N/A 01/29/2015   Procedure: TRANSESOPHAGEAL ECHOCARDIOGRAM (TEE);  Surgeon: [name], MD;  Location: [facility] ENDOSCOPY;  Service: Cardiovascular;  Laterality: N/A;     Medications: Current Meds  Medication Sig  . acetaminophen (TYLENOL) 325 MG tablet Take 2 tablets (650 mg total) by mouth every 6 (six) hours as needed for mild pain or headache.  [name] amiodarone (PACERONE) 200 MG tablet Take 1 tablet (200 mg total) by mouth daily.  [name] apixaban (ELIQUIS) 5 MG TABS tablet Take 5 mg by mouth 2 (two) times daily.  [name] atorvastatin (LIPITOR) 40 MG tablet Take 40 mg by mouth daily at 6 PM.   . carvedilol (COREG) 6.25 MG tablet Take 6.25 mg by mouth 2 (two) times daily with a meal.  . diltiazem (DILACOR XR) 120 MG 24 hr capsule Take 120 mg by mouth daily.  . ferrous sulfate 325 (65 FE) MG tablet Take 325 mg by mouth daily.  . furosemide (LASIX) 40 MG tablet Take 1 tablet (40 mg total) by mouth 2 (two) times daily.  [name] gabapentin (NEURONTIN) 300 MG capsule Take 300 mg by mouth 2 (two) times daily.  . insulin degludec (TRESIBA FLEXTOUCH) 100 UNIT/ML SOPN FlexTouch Pen Inject 85 Units into the skin daily.  [name] losartan (COZAAR) 100 MG tablet Take 1 tablet (100 mg total) by mouth daily.  . nitroGLYCERIN (NITROSTAT) 0.4 MG SL tablet PLACE 1 TABLET UNDER THE TONGUE EVERY 5 (FIVE) MINUTES AS NEEDED FOR CHEST PAIN (ANGINA).  [name]  pantoprazole (PROTONIX) 40 MG tablet Take 1 tablet (40 mg total) by mouth daily.  . potassium chloride SA (K-DUR,KLOR-CON) 20 MEQ tablet Take 1 tablet (20 mEq total) by mouth daily.     Allergies: No Known Allergies  Social History: The patient  reports that he quit smoking about 46 years ago. His smoking use included cigarettes. He has a 12.00 pack-year smoking history. He has never used smokeless tobacco. He reports that he does not drink alcohol or use drugs.   Family History: The patient's family history includes Diabetes in his father and mother; Heart attack in his brother, father, and mother; Heart disease in his father and mother.   Review of Systems: Please see the history of present illness.   Otherwise, the review of systems is positive for none.   All other systems are reviewed and negative.   Physical Exam: VS:  BP (!) 148/100 (BP Location: Left Arm, Patient Position: Sitting, Cuff Size: Normal)   Pulse (!) 101   Ht 6' (1.829 m)   Wt 204 lb 12.8 oz (92.9 kg)   BMI 27.78 kg/m  .  BMI Body mass index is 27.78 kg/m.  Wt Readings from Last 3 Encounters:  04/19/18 204 lb 12.8 oz (92.9 kg)  04/06/18 187 lb (84.8 kg)  03/03/18 202 lb (91.6 kg)    General: Pleasant. Alert and in no acute distress.   HEENT: Normal.  Neck: Supple, no JVD, carotid bruits, or masses noted.  Cardiac: Irregular irregular rhythm. Rate is a little fast. Heart tones are distant.  No edema.  Respiratory:  Lungs are clear to auscultation bilaterally with normal work of breathing.  GI: Soft and nontender.  MS: No deformity or atrophy. Gait and ROM intact.  Skin: Warm and dry. Color is normal.  Neuro:  Strength and sensation are intact and no gross focal deficits noted.  Psych: Alert, appropriate and with normal affect.   LABORATORY DATA:  EKG:  EKG is ordered today. This looks to show atrial flutter with variable block - with HR of 101. Anteroseptal Q's along with inferior Q's noted.   Lab  Results  Component Value Date   WBC 11.9 (H) 01/21/2018   HGB 9.4 (L) 01/21/2018   HCT 31.2 (L) 01/21/2018   PLT 321 01/21/2018   GLUCOSE 221 (H) 03/15/2018   CHOL 149 12/07/2017   TRIG 98 12/07/2017   HDL 33 (L) 12/07/2017   LDLCALC 96 12/07/2017   ALT 25 01/14/2018   AST 43 (H) 01/14/2018   NA 139 03/15/2018   K 4.3 03/15/2018   CL 97 03/15/2018   CREATININE 1.44 (H) 03/15/2018   BUN 23 03/15/2018   CO2 26 03/15/2018   TSH 0.111 (L) 01/19/2018   INR 1.2 08/05/2017   HGBA1C 9.8 (H) 01/28/2015       BNP (last 3 results) Recent Labs    12/06/17 1731 01/14/18 0707  BNP 121.7* 781.3*    ProBNP (last 3 results) Recent Labs    03/03/18 0956  PROBNP 1,970*     Other Studies Reviewed Today:  Echo Study Conclusions 01/2018  - Left ventricle: The cavity size was normal. There was moderate   concentric hypertrophy. Systolic function was mildly reduced. The   estimated ejection fraction was in the range of 45% to 50%. There   is akinesis of the basal-midinferolateral and inferior   myocardium. There is hypokinesis of the basalinferoseptal   myocardium. The study was not technically sufficient to allow   evaluation of LV diastolic dysfunction due to atrial   fibrillation. - Mitral valve: Calcified annulus. There was trivial regurgitation. - Left atrium: The atrium was mildly dilated. - Right ventricle: The cavity size was moderately dilated. Wall   thickness was normal. Systolic function was mildly to moderately   reduced. - Right atrium: The atrium was mildly dilated. - Tricuspid valve: There was mild regurgitation. - Pulmonary arteries: PA peak pressure: 44 mm Hg (S).  Impressions:  - The right ventricular systolic pressure was increased consistent   with moderate pulmonary hypertension.   CORONARY STENT INTERVENTION 07/2017  LEFT HEART CATH AND CORS/GRAFTS ANGIOGRAPHY  Conclusion     Ost LAD lesion is 100% stenosed.  Prox RCA lesion is 100%  stenosed.  Ost LM to Dist LM lesion is 75% stenosed.  Prox Cx to Mid Cx lesion is 25% stenosed.  Prox Cx lesion is 70%  stenosed.  LIMA graft was visualized by angiography and is normal in caliber.  The graft exhibits no disease.  RIMA graft was visualized by non-selective angiography.  A drug-eluting stent was successfully placed using a STENT SYNERGY DES 3X24.  Post intervention, there is a 0% residual stenosis.  Post intervention, there is a 0% residual stenosis.  A drug-eluting stent was successfully placed.  Post intervention, there is a 0% residual stenosis.   1. Severe native 3 vessel CAD with total occlusion of the RCA and LAD, severe stenosis of the native left main and native circumflex 2. Successful PCI of the left main and LCx using DES platforms  Recommend: ASA 81 mg x 1 month, plavix 75 mg daily, and resume eliquis tomorrow    Assessment/Plan:  1. Recurrent atrial flutter - uncontrolled rate - I have discussed the situation with Dr. Tamala Julian per phone - he would like to proceed with arranging cardioversion - first available and to increase Coreg to 12.5 mg BID. Continue Eliquis - no missed doses. Continue with low dose Amiodarone for now. Cardioversion arranged for next week. The procedure has been reviewed including risks and benefits and he is willing to proceed. Lab today.   2. CAD with prior PCI of the left main and LCX from 07/2017 - no longer on Plavix. No chest pain.   3. Persistent AF - was in NSR from EKG from August - he has decompensated with worsening CHF with AF.   4. Prior DVT -  Remains on Eliquis  5.  Chronic systolic and diastolic HF - breathing is stable. Has gained a few pounds but eating better. Swelling resolved. Continue with current dose of Lasix and plan to restore NSR.   6. HTN - Coreg is being increased today.   7. Esophageal cancer stage III s/p radiation and chemotherapy - followed by oncology.   8. 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 Doubt he is a surgical candidate given his multiple co-morbidities.    Current medicines are reviewed with the patient today.  The patient does not have concerns regarding medicines other than what has been noted above.  The following changes have been made:  See above.  Labs/ tests ordered today include:   No orders of the defined types were placed in this encounter.    Disposition:   FU with Dr. Tamala Julian or me in a few weeks with repeat EKG.   Patient is agreeable to this plan and will call if any problems develop in the interim.   SignedTruitt Merle, NP  04/19/2018 8:20 AM  [facility] [address] [place], [place]  [address] Phone: [phone] Fax: [phone]

## 2018-04-18 NOTE — H&P (View-Only) (Signed)
CARDIOLOGY OFFICE NOTE  Date:  04/19/2018    Justin Blackburn. Date of Birth: Apr 08, 1951 Medical Record #[id number]  PCP:  [name], PA-C  Cardiologist:  Tamala Julian   Chief Complaint  Patient presents with  . Coronary Artery Disease  . Congestive Heart Failure    6 week check - seen for Dr. Tamala Julian    History of Present Illness: Justin Blackburn. is a 67 y.o. male who presents today for a 6 week check. Seen for Dr. Tamala Julian.   He has a history of CAD with CABG in 1998 and 2007, prior left main and circumflex DES 07/2017, upper GI bleed related to previously undiagnosed esophageal cancer - treated with chemotherapy and radiation, prior cardioversion in 2016 for atrial flutter, paroxysmal atrial fibrillation on chronic anticoagulation therapy, and acute on chronic systolic and diastolic heart failure related to atrial fibrillation. He also has a known thoracic aortic aneursym.   Seen back last month - concern for his medicines. Was probably taking excessive Eliquis. Wife typically prepares and dispenses his medicines. Noted he was NOT to be on amiodarone (concern for prior toxicity following admission in August with respiratory failure) and losartan - but he was taking both after clarification with his wife - the amiodarone was originally going to be stopped but then both were continued per prior phone note and discussion with Dr. Tamala Julian. Had had some swelling - unilateral - advised support stockings.  Plavix was stopped.   Comes in today. Here with his wife. He has been released from pulmonary. Dizzy with walking and feels like he will pass out - no frank syncope. He says this has been going on for a while. No real shortness of breath. No chest pain. He feels like he is getting stronger. Taking his Eliquis - no missed doses. No bleeding. Using support stockings with good results. Weight is up some - but he has been eating better. No active bleeding.   Past Medical History:  Diagnosis Date    . A-fib ([place])    with RVR  . Acute kidney injury ([place])   . Anemia   . Angina   . Arthritis    "hands" (08/10/2017)  . Atrial flutter, paroxysmal ([place])    arrived with aflutter with RVR, EF down to 25-30%, s/p TEE DCCV on 01/29/2015  . Cancer ([place])   . CHF (congestive heart failure) ([place])   . Chronic lower back pain    "all the time from my sciatic nerve"  . Coronary artery disease   . DVT (deep venous thrombosis) ([place])   . Dyspnea   . Erectile dysfunction   . Heart failure ([place])   . Hypercholesteremia   . Hypertension   . 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 Myocardial infarction ([place]) 1988; ~6073;  ~1996;~ 2000  . Obesity   . Peripheral vascular disease ([place])   . Umbilical hernia    unrepaired    Past Surgical History:  Procedure Laterality Date  . CARDIOVERSION N/A 01/29/2015   Procedure: CARDIOVERSION;  Surgeon: [name], MD;  Location: [facility];  Service: Cardiovascular;  Laterality: N/A;  . CORONARY ANGIOPLASTY WITH STENT PLACEMENT  08/31/11   "2 today; makes total of 3 stents"  . CORONARY ANGIOPLASTY WITH STENT PLACEMENT  08/10/2017   "2 today; makes a total of 5 stents" (08/10/2017)  . CORONARY ARTERY BYPASS GRAFT  1998; 2007   CABG X 4; CABG X4  . CORONARY STENT INTERVENTION  N/A 08/10/2017   Procedure: CORONARY STENT INTERVENTION;  Surgeon: Sherren Mocha, MD;  Location: [facility];  Service: Cardiovascular;  Laterality: N/A;  . ESOPHAGOGASTRODUODENOSCOPY (EGD) WITH PROPOFOL N/A 09/01/2017   Procedure: ESOPHAGOGASTRODUODENOSCOPY (EGD) WITH PROPOFOL;  Surgeon: Laurence Spates, MD;  Location: [facility];  Service: Endoscopy;  Laterality: N/A;  . EUS N/A 09/29/2017   Procedure: UPPER ENDOSCOPIC ULTRASOUND (EUS) LINEAR;  Surgeon: Arta Silence, MD;  Location: [facility];  Service: Endoscopy;  Laterality: N/A;  . LEFT HEART CATH AND CORS/GRAFTS ANGIOGRAPHY N/A 08/10/2017   Procedure: LEFT HEART CATH AND CORS/GRAFTS ANGIOGRAPHY;   Surgeon: Sherren Mocha, MD;  Location: [facility];  Service: Cardiovascular;  Laterality: N/A;  . LEFT HEART CATHETERIZATION WITH CORONARY/GRAFT ANGIOGRAM N/A 08/31/2011   Procedure: LEFT HEART CATHETERIZATION WITH Beatrix Fetters;  Surgeon: [name], MD;  Location: [facility] CATH LAB;  Service: Cardiovascular;  Laterality: N/A;  . PERIPHERAL VASCULAR CATHETERIZATION N/A 04/27/2016   Procedure: Abdominal Aortogram w/Lower Extremity;  Surgeon: Angelia Mould, MD;  Location: [facility];  Service: Cardiovascular;  Laterality: N/A;  . TEE WITHOUT CARDIOVERSION N/A 01/29/2015   Procedure: TRANSESOPHAGEAL ECHOCARDIOGRAM (TEE);  Surgeon: [name], MD;  Location: [facility] ENDOSCOPY;  Service: Cardiovascular;  Laterality: N/A;     Medications: Current Meds  Medication Sig  . acetaminophen (TYLENOL) 325 MG tablet Take 2 tablets (650 mg total) by mouth every 6 (six) hours as needed for mild pain or headache.  [name] amiodarone (PACERONE) 200 MG tablet Take 1 tablet (200 mg total) by mouth daily.  [name] apixaban (ELIQUIS) 5 MG TABS tablet Take 5 mg by mouth 2 (two) times daily.  [name] atorvastatin (LIPITOR) 40 MG tablet Take 40 mg by mouth daily at 6 PM.   . carvedilol (COREG) 6.25 MG tablet Take 6.25 mg by mouth 2 (two) times daily with a meal.  . diltiazem (DILACOR XR) 120 MG 24 hr capsule Take 120 mg by mouth daily.  . ferrous sulfate 325 (65 FE) MG tablet Take 325 mg by mouth daily.  . furosemide (LASIX) 40 MG tablet Take 1 tablet (40 mg total) by mouth 2 (two) times daily.  [name] gabapentin (NEURONTIN) 300 MG capsule Take 300 mg by mouth 2 (two) times daily.  . insulin degludec (TRESIBA FLEXTOUCH) 100 UNIT/ML SOPN FlexTouch Pen Inject 85 Units into the skin daily.  [name] losartan (COZAAR) 100 MG tablet Take 1 tablet (100 mg total) by mouth daily.  . nitroGLYCERIN (NITROSTAT) 0.4 MG SL tablet PLACE 1 TABLET UNDER THE TONGUE EVERY 5 (FIVE) MINUTES AS NEEDED FOR CHEST PAIN (ANGINA).  [name]  pantoprazole (PROTONIX) 40 MG tablet Take 1 tablet (40 mg total) by mouth daily.  . potassium chloride SA (K-DUR,KLOR-CON) 20 MEQ tablet Take 1 tablet (20 mEq total) by mouth daily.     Allergies: No Known Allergies  Social History: The patient  reports that he quit smoking about 46 years ago. His smoking use included cigarettes. He has a 12.00 pack-year smoking history. He has never used smokeless tobacco. He reports that he does not drink alcohol or use drugs.   Family History: The patient's family history includes Diabetes in his father and mother; Heart attack in his brother, father, and mother; Heart disease in his father and mother.   Review of Systems: Please see the history of present illness.   Otherwise, the review of systems is positive for none.   All other systems are reviewed and negative.   Physical Exam: VS:  BP (!) 148/100 (BP Location: Left Arm, Patient Position: Sitting, Cuff Size: Normal)   Pulse (!) 101   Ht 6' (1.829 m)   Wt 204 lb 12.8 oz (92.9 kg)   BMI 27.78 kg/m  .  BMI Body mass index is 27.78 kg/m.  Wt Readings from Last 3 Encounters:  04/19/18 204 lb 12.8 oz (92.9 kg)  04/06/18 187 lb (84.8 kg)  03/03/18 202 lb (91.6 kg)    General: Pleasant. Alert and in no acute distress.   HEENT: Normal.  Neck: Supple, no JVD, carotid bruits, or masses noted.  Cardiac: Irregular irregular rhythm. Rate is a little fast. Heart tones are distant.  No edema.  Respiratory:  Lungs are clear to auscultation bilaterally with normal work of breathing.  GI: Soft and nontender.  MS: No deformity or atrophy. Gait and ROM intact.  Skin: Warm and dry. Color is normal.  Neuro:  Strength and sensation are intact and no gross focal deficits noted.  Psych: Alert, appropriate and with normal affect.   LABORATORY DATA:  EKG:  EKG is ordered today. This looks to show atrial flutter with variable block - with HR of 101. Anteroseptal Q's along with inferior Q's noted.   Lab  Results  Component Value Date   WBC 11.9 (H) 01/21/2018   HGB 9.4 (L) 01/21/2018   HCT 31.2 (L) 01/21/2018   PLT 321 01/21/2018   GLUCOSE 221 (H) 03/15/2018   CHOL 149 12/07/2017   TRIG 98 12/07/2017   HDL 33 (L) 12/07/2017   LDLCALC 96 12/07/2017   ALT 25 01/14/2018   AST 43 (H) 01/14/2018   NA 139 03/15/2018   K 4.3 03/15/2018   CL 97 03/15/2018   CREATININE 1.44 (H) 03/15/2018   BUN 23 03/15/2018   CO2 26 03/15/2018   TSH 0.111 (L) 01/19/2018   INR 1.2 08/05/2017   HGBA1C 9.8 (H) 01/28/2015       BNP (last 3 results) Recent Labs    12/06/17 1731 01/14/18 0707  BNP 121.7* 781.3*    ProBNP (last 3 results) Recent Labs    03/03/18 0956  PROBNP 1,970*     Other Studies Reviewed Today:  Echo Study Conclusions 01/2018  - Left ventricle: The cavity size was normal. There was moderate   concentric hypertrophy. Systolic function was mildly reduced. The   estimated ejection fraction was in the range of 45% to 50%. There   is akinesis of the basal-midinferolateral and inferior   myocardium. There is hypokinesis of the basalinferoseptal   myocardium. The study was not technically sufficient to allow   evaluation of LV diastolic dysfunction due to atrial   fibrillation. - Mitral valve: Calcified annulus. There was trivial regurgitation. - Left atrium: The atrium was mildly dilated. - Right ventricle: The cavity size was moderately dilated. Wall   thickness was normal. Systolic function was mildly to moderately   reduced. - Right atrium: The atrium was mildly dilated. - Tricuspid valve: There was mild regurgitation. - Pulmonary arteries: PA peak pressure: 44 mm Hg (S).  Impressions:  - The right ventricular systolic pressure was increased consistent   with moderate pulmonary hypertension.   CORONARY STENT INTERVENTION 07/2017  LEFT HEART CATH AND CORS/GRAFTS ANGIOGRAPHY  Conclusion     Ost LAD lesion is 100% stenosed.  Prox RCA lesion is 100%  stenosed.  Ost LM to Dist LM lesion is 75% stenosed.  Prox Cx to Mid Cx lesion is 25% stenosed.  Prox Cx lesion is 70%  stenosed.  LIMA graft was visualized by angiography and is normal in caliber.  The graft exhibits no disease.  RIMA graft was visualized by non-selective angiography.  A drug-eluting stent was successfully placed using a STENT SYNERGY DES 3X24.  Post intervention, there is a 0% residual stenosis.  Post intervention, there is a 0% residual stenosis.  A drug-eluting stent was successfully placed.  Post intervention, there is a 0% residual stenosis.   1. Severe native 3 vessel CAD with total occlusion of the RCA and LAD, severe stenosis of the native left main and native circumflex 2. Successful PCI of the left main and LCx using DES platforms  Recommend: ASA 81 mg x 1 month, plavix 75 mg daily, and resume eliquis tomorrow    Assessment/Plan:  1. Recurrent atrial flutter - uncontrolled rate - I have discussed the situation with Dr. Tamala Julian per phone - he would like to proceed with arranging cardioversion - first available and to increase Coreg to 12.5 mg BID. Continue Eliquis - no missed doses. Continue with low dose Amiodarone for now. Cardioversion arranged for next week. The procedure has been reviewed including risks and benefits and he is willing to proceed. Lab today.   2. CAD with prior PCI of the left main and LCX from 07/2017 - no longer on Plavix. No chest pain.   3. Persistent AF - was in NSR from EKG from August - he has decompensated with worsening CHF with AF.   4. Prior DVT -  Remains on Eliquis  5.  Chronic systolic and diastolic HF - breathing is stable. Has gained a few pounds but eating better. Swelling resolved. Continue with current dose of Lasix and plan to restore NSR.   6. HTN - Coreg is being increased today.   7. Esophageal cancer stage III s/p radiation and chemotherapy - followed by oncology.   8. 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 Doubt he is a surgical candidate given his multiple co-morbidities.    Current medicines are reviewed with the patient today.  The patient does not have concerns regarding medicines other than what has been noted above.  The following changes have been made:  See above.  Labs/ tests ordered today include:   No orders of the defined types were placed in this encounter.    Disposition:   FU with Dr. Tamala Julian or me in a few weeks with repeat EKG.   Patient is agreeable to this plan and will call if any problems develop in the interim.   SignedTruitt Merle, NP  04/19/2018 8:20 AM  [facility] [address] [place],   [address] Phone: [phone] Fax: [phone]

## 2018-04-19 ENCOUNTER — Ambulatory Visit (INDEPENDENT_AMBULATORY_CARE_PROVIDER_SITE_OTHER): Payer: Medicare HMO | Admitting: Nurse Practitioner

## 2018-04-19 ENCOUNTER — Encounter: Payer: Self-pay | Admitting: Nurse Practitioner

## 2018-04-19 VITALS — BP 148/100 | HR 101 | Ht 72.0 in | Wt 204.8 lb

## 2018-04-19 DIAGNOSIS — I4892 Unspecified atrial flutter: Secondary | ICD-10-CM

## 2018-04-19 DIAGNOSIS — Z79899 Other long term (current) drug therapy: Secondary | ICD-10-CM | POA: Diagnosis not present

## 2018-04-19 DIAGNOSIS — I25810 Atherosclerosis of coronary artery bypass graft(s) without angina pectoris: Secondary | ICD-10-CM

## 2018-04-19 DIAGNOSIS — I5042 Chronic combined systolic (congestive) and diastolic (congestive) heart failure: Secondary | ICD-10-CM

## 2018-04-19 DIAGNOSIS — I4819 Other persistent atrial fibrillation: Secondary | ICD-10-CM

## 2018-04-19 LAB — CBC
Hematocrit: 35.9 % — ABNORMAL LOW (ref 37.5–51.0)
Hemoglobin: 12.1 g/dL — ABNORMAL LOW (ref 13.0–17.7)
MCH: 28.5 pg (ref 26.6–33.0)
MCHC: 33.7 g/dL (ref 31.5–35.7)
MCV: 85 fL (ref 79–97)
Platelets: 265 10*3/uL (ref 150–450)
RBC: 4.24 x10E6/uL (ref 4.14–5.80)
RDW: 18.7 % — ABNORMAL HIGH (ref 12.3–15.4)
WBC: 6.6 10*3/uL (ref 3.4–10.8)

## 2018-04-19 LAB — BASIC METABOLIC PANEL
BUN/Creatinine Ratio: 12 (ref 10–24)
BUN: 13 mg/dL (ref 8–27)
CO2: 28 mmol/L (ref 20–29)
Calcium: 8.8 mg/dL (ref 8.6–10.2)
Chloride: 103 mmol/L (ref 96–106)
Creatinine, Ser: 1.06 mg/dL (ref 0.76–1.27)
GFR calc Af Amer: 82 mL/min/{1.73_m2} (ref >59)
GFR calc non Af Amer: 71 mL/min/{1.73_m2} (ref >59)
Glucose: 104 mg/dL — ABNORMAL HIGH (ref 65–99)
Potassium: 3.5 mmol/L (ref 3.5–5.2)
Sodium: 138 mmol/L (ref 134–144)

## 2018-04-19 LAB — PT AND PTT
INR: 1.2 (ref 0.8–1.2)
Prothrombin Time: 11.8 s (ref 9.1–12.0)
aPTT: 26 s (ref 24–33)

## 2018-04-19 MED ORDER — FUROSEMIDE 40 MG PO TABS: 40.0000 mg | ORAL_TABLET | Freq: Two times a day (BID) | ORAL | 3 refills | Status: DC

## 2018-04-19 MED ORDER — CARVEDILOL 12.5 MG PO TABS: 12.5000 mg | ORAL_TABLET | Freq: Two times a day (BID) | ORAL | 3 refills | Status: DC

## 2018-04-19 NOTE — Patient Instructions (Addendum)
We will be checking the following labs today - BMET, CBC, PT & PTT   Medication Instructions:    Continue with your current medicines. BUT  I am increasing the Coreg to 12.5 mg to take twice a day - this is at the drug store - start this dose tonight.   I refilled the lasix today as well.    If you need a refill on your cardiac medications before your next appointment, please call your pharmacy.     Testing/Procedures To Be Arranged:  Cardioversion   Your provider has recommended a cardioversion.   You are scheduled for a cardioversion on Tuesday, November 12th at 12 noon with Dr. Johnsie Cancel or associates. Please go to [facility] ([address] [place] on Tuesday, November 12th at 10:30Am.  Enter through the [place] not have any food or drink after midnight on Monday.  You may take your medicines with a sip of water on the day of your procedure.   Hold the following medicines - Lasix  DO NOT STOP YOUR ANTICOAGULANT (BLOOD THINNER) ELIQUIS - YOU WILL NEED TO CONTINUE YOUR ANTICOAGULANT AFTER YOUR PROCEDURE   You will need someone to drive you home following your procedure and stay in the waiting room during your procedure. Failure to do so could result in your procedure being cancelled.   Every effort is made to have your procedure done on time. Occasionally there are emergencies that occur at the hospital that may cause delays.   Call the [place] office at [phone] if you have any questions, problems or concerns.     Electrical Cardioversion Electrical cardioversion is the delivery of a jolt of electricity to change the rhythm of the heart. Sticky patches or metal paddles are placed on the chest to deliver the electricity from a device. This is done to restore a normal rhythm. A rhythm that is too fast or not regular keeps the heart from pumping well. Electrical cardioversion is done in an emergency  if:   There is low or no blood pressure as a result of the heart rhythm.   Normal rhythm must be restored as fast as possible to protect the brain and heart from further damage.   It may save a life. Cardioversion may be done for heart rhythms that are not immediately life threatening, such as atrial fibrillation or flutter, in which:   The heart is beating too fast or is not regular.   Medicine to change the rhythm has not worked.   It is safe to wait in order to allow time for preparation.  Symptoms of the abnormal rhythm are bothersome.  The risk of stroke and other serious problems can be reduced.  LET [facility] CARE PROVIDER KNOW ABOUT:   Any allergies you have.  All medicines you are taking, including vitamins, herbs, eye drops, creams, and over-the-counter medicines.  Previous problems you or members of your family have had with the use of anesthetics.   Any blood disorders you have.   Previous surgeries you have had.   Medical conditions you have.   RISKS AND COMPLICATIONS  Generally, this is a safe procedure. However, problems can occur and include:   Breathing problems related to the anesthetic used.  A blood clot that breaks free and travels to other parts of your body. This could cause a stroke or other problems. The risk of this is lowered by use of blood-thinning  medicine (anticoagulant) prior to the procedure.  Cardiac arrest (rare).   BEFORE THE PROCEDURE   You may have tests to detect blood clots in your heart and to evaluate heart function.  You may start taking anticoagulants so your blood does not clot as easily.   Medicines may be given to help stabilize your heart rate and rhythm.   PROCEDURE  You will be given medicine through an IV tube to reduce discomfort and make you sleepy (sedative).   An electrical shock will be delivered.   AFTER THE PROCEDURE Your heart rhythm will be watched to make sure it does not change. You  will need someone to drive you home.     Follow-Up:   See Dr. Tamala Julian a few weeks following cardioversion    At [facility], you and your health needs are our priority.  As part of our continuing mission to provide you with exceptional heart care, we have created designated Provider Care Teams.  These Care Teams include your primary Cardiologist (physician) and Advanced Practice Providers (APPs -  Physician Assistants and Nurse Practitioners) who all work together to provide you with the care you need, when you need it.  Special Instructions:  . None  Call the [place] office at [phone] if you have any questions, problems or concerns.

## 2018-04-26 ENCOUNTER — Encounter (HOSPITAL_COMMUNITY): Payer: Self-pay | Admitting: *Deleted

## 2018-04-26 ENCOUNTER — Ambulatory Visit (HOSPITAL_COMMUNITY): Payer: Medicare HMO | Admitting: Anesthesiology

## 2018-04-26 ENCOUNTER — Encounter (HOSPITAL_COMMUNITY)
Admission: RE | Disposition: A | Discharge status: Home / Self Care | Payer: Self-pay | Source: Ambulatory Visit | Attending: Cardiovascular Disease

## 2018-04-26 ENCOUNTER — Ambulatory Visit (HOSPITAL_COMMUNITY)
Admission: RE | Admit: 2018-04-26 | Discharge: 2018-04-26 | Disposition: A | Discharge status: Home / Self Care | Payer: Medicare HMO | Source: Ambulatory Visit | Attending: Cardiovascular Disease | Admitting: Cardiovascular Disease

## 2018-04-26 ENCOUNTER — Other Ambulatory Visit: Payer: Self-pay

## 2018-04-26 DIAGNOSIS — Z8249 Family history of ischemic heart disease and other diseases of the circulatory system: Secondary | ICD-10-CM | POA: Diagnosis not present

## 2018-04-26 DIAGNOSIS — I2510 Atherosclerotic heart disease of native coronary artery without angina pectoris: Secondary | ICD-10-CM | POA: Diagnosis not present

## 2018-04-26 DIAGNOSIS — Z955 Presence of coronary angioplasty implant and graft: Secondary | ICD-10-CM | POA: Diagnosis not present

## 2018-04-26 DIAGNOSIS — I4891 Unspecified atrial fibrillation: Secondary | ICD-10-CM

## 2018-04-26 DIAGNOSIS — E1151 Type 2 diabetes mellitus with diabetic peripheral angiopathy without gangrene: Secondary | ICD-10-CM | POA: Diagnosis not present

## 2018-04-26 DIAGNOSIS — I4892 Unspecified atrial flutter: Secondary | ICD-10-CM | POA: Diagnosis not present

## 2018-04-26 DIAGNOSIS — M545 Low back pain: Secondary | ICD-10-CM | POA: Insufficient documentation

## 2018-04-26 DIAGNOSIS — E7800 Pure hypercholesterolemia, unspecified: Secondary | ICD-10-CM | POA: Insufficient documentation

## 2018-04-26 DIAGNOSIS — Z7901 Long term (current) use of anticoagulants: Secondary | ICD-10-CM | POA: Diagnosis not present

## 2018-04-26 DIAGNOSIS — I5042 Chronic combined systolic (congestive) and diastolic (congestive) heart failure: Secondary | ICD-10-CM

## 2018-04-26 DIAGNOSIS — M1990 Unspecified osteoarthritis, unspecified site: Secondary | ICD-10-CM | POA: Diagnosis not present

## 2018-04-26 DIAGNOSIS — G8929 Other chronic pain: Secondary | ICD-10-CM | POA: Insufficient documentation

## 2018-04-26 DIAGNOSIS — I252 Old myocardial infarction: Secondary | ICD-10-CM | POA: Diagnosis not present

## 2018-04-26 DIAGNOSIS — Z9221 Personal history of antineoplastic chemotherapy: Secondary | ICD-10-CM | POA: Insufficient documentation

## 2018-04-26 DIAGNOSIS — I712 Thoracic aortic aneurysm, without rupture: Secondary | ICD-10-CM | POA: Diagnosis not present

## 2018-04-26 DIAGNOSIS — Z79899 Other long term (current) drug therapy: Secondary | ICD-10-CM

## 2018-04-26 DIAGNOSIS — Z87891 Personal history of nicotine dependence: Secondary | ICD-10-CM | POA: Insufficient documentation

## 2018-04-26 DIAGNOSIS — Z923 Personal history of irradiation: Secondary | ICD-10-CM | POA: Diagnosis not present

## 2018-04-26 DIAGNOSIS — Z794 Long term (current) use of insulin: Secondary | ICD-10-CM | POA: Insufficient documentation

## 2018-04-26 DIAGNOSIS — I2582 Chronic total occlusion of coronary artery: Secondary | ICD-10-CM | POA: Diagnosis not present

## 2018-04-26 DIAGNOSIS — I110 Hypertensive heart disease with heart failure: Secondary | ICD-10-CM | POA: Insufficient documentation

## 2018-04-26 DIAGNOSIS — E669 Obesity, unspecified: Secondary | ICD-10-CM | POA: Diagnosis not present

## 2018-04-26 DIAGNOSIS — I4819 Other persistent atrial fibrillation: Secondary | ICD-10-CM | POA: Diagnosis not present

## 2018-04-26 DIAGNOSIS — I25810 Atherosclerosis of coronary artery bypass graft(s) without angina pectoris: Secondary | ICD-10-CM

## 2018-04-26 DIAGNOSIS — Z951 Presence of aortocoronary bypass graft: Secondary | ICD-10-CM | POA: Insufficient documentation

## 2018-04-26 DIAGNOSIS — Z86718 Personal history of other venous thrombosis and embolism: Secondary | ICD-10-CM | POA: Insufficient documentation

## 2018-04-26 HISTORY — PX: CARDIOVERSION: SHX1299

## 2018-04-26 SURGERY — CARDIOVERSION
Anesthesia: General

## 2018-04-26 MED ORDER — SODIUM CHLORIDE 0.9 % IV SOLN
INTRAVENOUS | Status: DC
  Administered 2018-04-26: 12:00:00 via INTRAVENOUS
  Administered 2018-04-26: 500 mL via INTRAVENOUS

## 2018-04-26 MED ORDER — PROPOFOL 10 MG/ML IV BOLUS
INTRAVENOUS | Status: DC | PRN
  Administered 2018-04-26: 50 mg via INTRAVENOUS

## 2018-04-26 MED ORDER — LIDOCAINE HCL (CARDIAC) PF 100 MG/5ML IV SOSY
PREFILLED_SYRINGE | INTRAVENOUS | Status: DC | PRN
  Administered 2018-04-26: 60 mg via INTRAVENOUS

## 2018-04-26 NOTE — Anesthesia Postprocedure Evaluation (Signed)
Anesthesia Post Note  Patient: Justin Blackburn.  Procedure(s) Performed: CARDIOVERSION (N/A )     Patient location during evaluation: Endoscopy Anesthesia Type: General Level of consciousness: awake and alert Pain management: pain level controlled Vital Signs Assessment: post-procedure vital signs reviewed and stable Respiratory status: spontaneous breathing, nonlabored ventilation, respiratory function stable and patient connected to nasal cannula oxygen Cardiovascular status: blood pressure returned to baseline and stable Postop Assessment: no apparent nausea or vomiting Anesthetic complications: no    Last Vitals:  Vitals:   04/26/18 1245 04/26/18 1250  BP: 94/66 96/70  Pulse: (!) 54 (!) 54  Resp: 14 17  Temp:    SpO2: 100% 99%    Last Pain:  Vitals:   04/26/18 1300  TempSrc:   PainSc: 0-No pain                 Ayliana Casciano S

## 2018-04-26 NOTE — Transfer of Care (Signed)
Immediate Anesthesia Transfer of Care Note  Patient: Justin Blackburn.  Procedure(s) Performed: CARDIOVERSION (N/A )  Patient Location: Endoscopy Unit  Anesthesia Type:General  Level of Consciousness: awake, alert  and oriented  Airway & Oxygen Therapy: Patient Spontanous Breathing  Post-op Assessment: Report given to RN and Post -op Vital signs reviewed and stable  Post vital signs: Reviewed and stable  Last Vitals:  Vitals Value Taken Time  BP    Temp    Pulse    Resp    SpO2      Last Pain:  Vitals:   04/26/18 1115  TempSrc: Oral  PainSc: 0-No pain         Complications: No apparent anesthesia complications

## 2018-04-26 NOTE — Anesthesia Preprocedure Evaluation (Signed)
Anesthesia Evaluation  Patient identified by MRN, date of birth, ID band Patient awake    Reviewed: Allergy & Precautions, H&P , NPO status , Patient's Chart, lab work & pertinent test results  Airway Mallampati: II   Neck ROM: full    Dental   Pulmonary shortness of breath, former smoker,    breath sounds clear to auscultation       Cardiovascular hypertension, + angina + CAD, + Past MI, + Peripheral Vascular Disease, +CHF and + DVT  + dysrhythmias Atrial Fibrillation  Rhythm:irregular Rate:Normal     Neuro/Psych    GI/Hepatic   Endo/Other  diabetes, Type 2  Renal/GU      Musculoskeletal  (+) Arthritis ,   Abdominal   Peds  Hematology   Anesthesia Other Findings   Reproductive/Obstetrics                             Anesthesia Physical Anesthesia Plan  ASA: III  Anesthesia Plan: General   Post-op Pain Management:    Induction: Intravenous  PONV Risk Score and Plan: 2 and Treatment may vary due to age or medical condition and Propofol infusion  Airway Management Planned: Mask  Additional Equipment:   Intra-op Plan:   Post-operative Plan:   Informed Consent: I have reviewed the patients History and Physical, chart, labs and discussed the procedure including the risks, benefits and alternatives for the proposed anesthesia with the patient or authorized representative who has indicated his/her understanding and acceptance.     Plan Discussed with: CRNA, Anesthesiologist and Surgeon  Anesthesia Plan Comments:         Anesthesia Quick Evaluation

## 2018-04-26 NOTE — Interval H&P Note (Signed)
History and Physical Interval Note:  04/26/2018 11:50 AM  Justin Blackburn.  has presented today for surgery, with the diagnosis of AFIB  The various methods of treatment have been discussed with the patient and family. After consideration of risks, benefits and other options for treatment, the patient has consented to  Procedure(s): CARDIOVERSION (N/A) as a surgical intervention .  The patient's history has been reviewed, patient examined, no change in status, stable for surgery.  I have reviewed the patient's chart and labs.  Questions were answered to the patient's satisfaction.     Jenkins Rouge

## 2018-04-26 NOTE — CV Procedure (Signed)
[place]: Anesthesia Propofol  DCC x 2 120 then 150 biphasic Converted from flutter rate 96 to SB rate 52 some junctional escpaes  D/C cardizem Continue coreg and amiodarone F/U afib clinic   On Rx eliquis with no missed doses  No immediate neurologic sequelae  Jenkins Rouge

## 2018-04-26 NOTE — Discharge Instructions (Signed)
Electrical Cardioversion, Care After °This sheet gives you information about how to care for yourself after your procedure. Your health care provider may also give you more specific instructions. If you have problems or questions, contact your health care provider. °What can I expect after the procedure? °After the procedure, it is common to have: °· Some redness on the skin where the shocks were given. ° °Follow these instructions at home: °· Do not drive for 24 hours if you were given a medicine to help you relax (sedative). °· Take over-the-counter and prescription medicines only as told by your health care provider. °· Ask your health care provider how to check your pulse. Check it often. °· Rest for 48 hours after the procedure or as told by your health care provider. °· Avoid or limit your caffeine use as told by your health care provider. °Contact a health care provider if: °· You feel like your heart is beating too quickly or your pulse is not regular. °· You have a serious muscle cramp that does not go away. °Get help right away if: °· You have discomfort in your chest. °· You are dizzy or you feel faint. °· You have trouble breathing or you are short of breath. °· Your speech is slurred. °· You have trouble moving an arm or leg on one side of your body. °· Your fingers or toes turn cold or blue. °This information is not intended to replace advice given to you by your health care provider. Make sure you discuss any questions you have with your health care provider. °Document Released: 03/22/2013 Document Revised: 01/03/2016 Document Reviewed: 12/06/2015 °Elsevier Interactive Patient Education © 2018 [employer] ° °

## 2018-04-27 ENCOUNTER — Encounter (HOSPITAL_COMMUNITY): Payer: Self-pay | Admitting: Cardiovascular Disease

## 2018-05-05 ENCOUNTER — Encounter: Payer: Self-pay | Admitting: Interventional Cardiology

## 2018-05-05 ENCOUNTER — Ambulatory Visit (INDEPENDENT_AMBULATORY_CARE_PROVIDER_SITE_OTHER): Payer: Medicare HMO | Admitting: Interventional Cardiology

## 2018-05-05 VITALS — BP 138/70 | HR 48 | Ht 72.0 in | Wt 210.4 lb

## 2018-05-05 DIAGNOSIS — I480 Paroxysmal atrial fibrillation: Secondary | ICD-10-CM | POA: Diagnosis not present

## 2018-05-05 DIAGNOSIS — I5042 Chronic combined systolic (congestive) and diastolic (congestive) heart failure: Secondary | ICD-10-CM

## 2018-05-05 DIAGNOSIS — Z794 Long term (current) use of insulin: Secondary | ICD-10-CM

## 2018-05-05 DIAGNOSIS — E1159 Type 2 diabetes mellitus with other circulatory complications: Secondary | ICD-10-CM | POA: Diagnosis not present

## 2018-05-05 DIAGNOSIS — C155 Malignant neoplasm of lower third of esophagus: Secondary | ICD-10-CM | POA: Diagnosis not present

## 2018-05-05 DIAGNOSIS — I25810 Atherosclerosis of coronary artery bypass graft(s) without angina pectoris: Secondary | ICD-10-CM | POA: Diagnosis not present

## 2018-05-05 DIAGNOSIS — Z7901 Long term (current) use of anticoagulants: Secondary | ICD-10-CM | POA: Diagnosis not present

## 2018-05-05 NOTE — Progress Notes (Addendum)
Cardiology Office Note:    Date:  05/05/2018   ID:  Justin Blackburn., DOB 12-24-1950, MRN [id number]  PCP:  [name], PA-C  Cardiologist:  [name], MD   Referring MD: [name], PA-C   Chief Complaint  Patient presents with  . Atrial Fibrillation  . Coronary Artery Disease  . Congestive Heart Failure    History of Present Illness:    Justin Blackburn. is a 67 y.o. male with a hx of CAD, CABG x2, recent left main and circumflex DES, upper GI bleed related to previously undiagnosed esophageal cancer, chemotherapy, paroxysmal atrial fibrillation, anticoagulation therapy, acute on chronic systolic and diastolic heart failure related to atrial fibrillation, returning after recent admission for GI bleed.  He is accompanied by his daughter-in-law.  He feels pretty good.  He is frequently out of energy.  Has had some episodes of lightheadedness and weakness.  Advocates compliance with medical regimen.  He cannot discuss his medication regimen because his wife does all the dispensing.  He denies angina.  No significant dyspnea.  No true episodes of syncope.   Past Medical History:  Diagnosis Date  . A-fib ([place])    with RVR  . Acute kidney injury ([place])   . Anemia   . Angina   . Arthritis    "hands" (08/10/2017)  . Atrial flutter, paroxysmal ([place])    arrived with aflutter with RVR, EF down to 25-30%, s/p TEE DCCV on 01/29/2015  . Cancer ([place])   . CHF (congestive heart failure) ([place])   . Chronic lower back pain    "all the time from my sciatic nerve"  . Coronary artery disease   . DVT (deep venous thrombosis) ([place])   . Dyspnea   . Erectile dysfunction   . Heart failure ([place])   . Hypercholesteremia   . Hypertension   . 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 Myocardial infarction ([place]) 1988; ~9528;  ~1996;~ 2000  . Obesity   . Peripheral vascular disease ([place])   . Umbilical hernia    unrepaired    Past Surgical History:  Procedure  Laterality Date  . CARDIOVERSION N/A 01/29/2015   Procedure: CARDIOVERSION;  Surgeon: [name], MD;  Location: [facility];  Service: Cardiovascular;  Laterality: N/A;  . CARDIOVERSION N/A 04/26/2018   Procedure: CARDIOVERSION;  Surgeon: Josue Hector, MD;  Location: [facility] ENDOSCOPY;  Service: Cardiovascular;  Laterality: N/A;  . CORONARY ANGIOPLASTY WITH STENT PLACEMENT  08/31/11   "2 today; makes total of 3 stents"  . CORONARY ANGIOPLASTY WITH STENT PLACEMENT  08/10/2017   "2 today; makes a total of 5 stents" (08/10/2017)  . CORONARY ARTERY BYPASS GRAFT  1998; 2007   CABG X 4; CABG X4  . CORONARY STENT INTERVENTION N/A 08/10/2017   Procedure: CORONARY STENT INTERVENTION;  Surgeon: Sherren Mocha, MD;  Location: [place] CV LAB;  Service: Cardiovascular;  Laterality: N/A;  . ESOPHAGOGASTRODUODENOSCOPY (EGD) WITH PROPOFOL N/A 09/01/2017   Procedure: ESOPHAGOGASTRODUODENOSCOPY (EGD) WITH PROPOFOL;  Surgeon: Laurence Spates, MD;  Location: [facility];  Service: Endoscopy;  Laterality: N/A;  . EUS N/A 09/29/2017   Procedure: UPPER ENDOSCOPIC ULTRASOUND (EUS) LINEAR;  Surgeon: Arta Silence, MD;  Location: [facility] ENDOSCOPY;  Service: Endoscopy;  Laterality: N/A;  . LEFT HEART CATH AND CORS/GRAFTS ANGIOGRAPHY N/A 08/10/2017   Procedure: LEFT HEART CATH AND CORS/GRAFTS ANGIOGRAPHY;  Surgeon: Sherren Mocha, MD;  Location: [place] [facility] CV LAB;  Service: Cardiovascular;  Laterality: N/A;  .  LEFT HEART CATHETERIZATION WITH CORONARY/GRAFT ANGIOGRAM N/A 08/31/2011   Procedure: LEFT HEART CATHETERIZATION WITH Beatrix Fetters;  Surgeon: [name], MD;  Location[facility] CATH LAB;  Service: Cardiovascular;  Laterality: N/A;  . PERIPHERAL VASCULAR CATHETERIZATION N/A 04/27/2016   Procedure: Abdominal Aortogram w/Lower Extremity;  Surgeon: Angelia Mould, MD;  Location: [place] CV LAB;  Service: Cardiovascular;  Laterality: N/A;  . TEE WITHOUT CARDIOVERSION N/A 01/29/2015    Procedure: TRANSESOPHAGEAL ECHOCARDIOGRAM (TEE);  Surgeon: [name], MD;  Location: [facility] ENDOSCOPY;  Service: Cardiovascular;  Laterality: N/A;    Current Medications: Current Meds  Medication Sig  . acetaminophen (TYLENOL) 325 MG tablet Take 2 tablets (650 mg total) by mouth every 6 (six) hours as needed for mild pain or headache.  [name] amiodarone (PACERONE) 200 MG tablet Take 1 tablet (200 mg total) by mouth daily.  [name] apixaban (ELIQUIS) 5 MG TABS tablet Take 5 mg by mouth 2 (two) times daily.  [name] atorvastatin (LIPITOR) 40 MG tablet Take 40 mg by mouth daily at 6 PM.   . carvedilol (COREG) 6.25 MG tablet Take 6.25 mg by mouth 2 (two) times daily with a meal.  . ferrous sulfate 325 (65 FE) MG tablet Take 325 mg by mouth daily.  . furosemide (LASIX) 40 MG tablet Take 1 tablet (40 mg total) by mouth 2 (two) times daily.  [name] gabapentin (NEURONTIN) 300 MG capsule Take 300 mg by mouth 2 (two) times daily.  . insulin degludec (TRESIBA FLEXTOUCH) 100 UNIT/ML SOPN FlexTouch Pen Inject 60 Units into the skin at bedtime.   [name] losartan (COZAAR) 100 MG tablet Take 1 tablet (100 mg total) by mouth daily.  . nitroGLYCERIN (NITROSTAT) 0.4 MG SL tablet PLACE 1 TABLET UNDER THE TONGUE EVERY 5 (FIVE) MINUTES AS NEEDED FOR CHEST PAIN (ANGINA). (Patient taking differently: Place 0.4 mg under the tongue every 5 (five) minutes as needed for chest pain. )  . pantoprazole (PROTONIX) 40 MG tablet Take 1 tablet (40 mg total) by mouth daily.  . potassium chloride SA (K-DUR,KLOR-CON) 20 MEQ tablet Take 1 tablet (20 mEq total) by mouth daily.  . [DISCONTINUED] carvedilol (COREG) 12.5 MG tablet Take 1 tablet (12.5 mg total) by mouth 2 (two) times daily.     Allergies:   Patient has no known allergies.   Social History   Socioeconomic History  . Marital status: Married    Spouse name: Not on file  . Number of children: Not on file  . Years of education: Not on file  . Highest education level: Not on file    Occupational History  . Not on file  Social Needs  . Financial resource strain: Not on file  . Food insecurity:    Worry: Not on file    Inability: Not on file  . Transportation needs:    Medical: Not on file    Non-medical: Not on file  Tobacco Use  . Smoking status: Former Smoker    Packs/day: 2.00    Years: 6.00    Pack years: 12.00    Types: Cigarettes    Last attempt to quit: 06/16/1971    Years since quitting: 46.9  . Smokeless tobacco: Never Used  . Tobacco comment: "chewed on cigars; never really did chew tobacco"  Substance and Sexual Activity  . Alcohol use: No  . Drug use: No  . Sexual activity: Never  Lifestyle  . Physical activity:    Days per week: Not on file    Minutes  per session: Not on file  . Stress: Not on file  Relationships  . Social connections:    Talks on phone: Not on file    Gets together: Not on file    Attends religious service: Not on file    Active member of club or organization: Not on file    Attends meetings of clubs or organizations: Not on file    Relationship status: Not on file  Other Topics Concern  . Not on file  Social History Narrative  . Not on file     Family History: The patient's family history includes Diabetes in his father and mother; Heart attack in his brother, father, and mother; Heart disease in his father and mother. There is no history of Stroke.  ROS:   Please see the history of present illness.    Snoring, difficulty with balance, back pain, dizziness, and vision disturbance.  All other systems reviewed and are negative.  EKGs/Labs/Other Studies Reviewed:    The following studies were reviewed today:   EKG:  EKG is  ordered today.  The ekg ordered today demonstrates sinus bradycardia at 48 bpm  Recent Labs: 01/14/2018: ALT 25; B Natriuretic Peptide 781.3 01/19/2018: TSH 0.111 01/23/2018: Magnesium 1.9 03/03/2018: NT-Pro BNP 1,970 04/19/2018: BUN 13; Creatinine, Ser 1.06; Hemoglobin 12.1; Platelets 265;  Potassium 3.5; Sodium 138  Recent Lipid Panel    Component Value Date/Time   CHOL 149 12/07/2017 0336   CHOL 129 09/21/2017 1352   TRIG 98 12/07/2017 0336   HDL 33 (L) 12/07/2017 0336   HDL 31 (L) 09/21/2017 1352   CHOLHDL 4.5 12/07/2017 0336   VLDL 20 12/07/2017 0336   LDLCALC 96 12/07/2017 0336   LDLCALC 70 09/21/2017 1352    Physical Exam:    VS:  BP 138/70   Pulse (!) 48   Ht 6' (1.829 m)   Wt 210 lb 6.4 oz (95.4 kg)   SpO2 97%   BMI 28.54 kg/m     Wt Readings from Last 3 Encounters:  05/05/18 210 lb 6.4 oz (95.4 kg)  04/19/18 204 lb 12.8 oz (92.9 kg)  04/06/18 187 lb (84.8 kg)     GEN:  Well nourished, well developed in no acute distress HEENT: Normal NECK: No JVD. LYMPHATICS: No lymphadenopathy CARDIAC: RRR, 2/6 systolic left lower sternal border murmur, no gallop, no edema. VASCULAR: 2+ bilateral radial pulses.  No bruits. RESPIRATORY: Crackles in bilateral lung fields. ABDOMEN: Soft, non-tender, non-distended, No pulsatile mass, MUSCULOSKELETAL: No deformity  SKIN: Warm and dry NEUROLOGIC:  Alert and oriented x 3 PSYCHIATRIC:  Normal affect   ASSESSMENT:    1. AF (paroxysmal atrial fibrillation) ([place])   2. Chronic combined systolic and diastolic heart failure, NYHA class 2 ([place])   3. Coronary artery disease involving coronary bypass graft of native heart without angina pectoris   4. Anticoagulated   5. Type 2 diabetes mellitus with other circulatory complication, with long-term current use of insulin ([place])   6. 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 Today's EKG demonstrates sinus bradycardia 48 bpm.  He feels somewhat weak.  He has not had syncope.  No racing heart or palpitations. 2. No evidence of volume overload although he does have pulmonary crackles possibly consistent with fibrosis. 3. Stable without angina. 4. No bleeding complications on apixaban. 5. Not addressed. 6. Not  addressed.  Overall plan is to continue current medical  regimen.  Will reassess LV function sometime in the spring.  Continue current regimen.  If lightheadedness and dizziness continues may need to cut his carvedilol dose to 3.125 mg twice daily.  We will continue the current dose to have some impact on LV systolic improvement   Medication Adjustments/Labs and Tests Ordered: Current medicines are reviewed at length with the patient today.  Concerns regarding medicines are outlined above.  Orders Placed This Encounter  Procedures  . EKG 12-Lead   No orders of the defined types were placed in this encounter.   Patient Instructions  Medication Instructions:  Your physician recommends that you continue on your current medications as directed. Please refer to the Current Medication list given to you today.  If you need a refill on your cardiac medications before your next appointment, please call your pharmacy.   Lab work: None If you have labs (blood work) drawn today and your tests are completely normal, you will receive your results only by: [name] MyChart Message (if you have MyChart) OR . A paper copy in the mail If you have any lab test that is abnormal or we need to change your treatment, we will call you to review the results.  Testing/Procedures: None  Follow-Up: At [facility], you and your health needs are our priority.  As part of our continuing mission to provide you with exceptional heart care, we have created designated Provider Care Teams.  These Care Teams include your primary Cardiologist (physician) and Advanced Practice Providers (APPs -  Physician Assistants and Nurse Practitioners) who all work together to provide you with the care you need, when you need it. You will need a follow up appointment in 3 months.  Please call our office 2 months in advance to schedule this appointment.  You may see [name], MD or one of the following Advanced Practice Providers  on your designated Care Team:   Truitt Merle, NP Cecilie Kicks, NP . Kathyrn Drown, NP  Any Other Special Instructions Will Be Listed Below (If Applicable).       Signed, [name], MD  05/05/2018 4:24 PM    [place]

## 2018-05-05 NOTE — Patient Instructions (Signed)
Medication Instructions:  Your physician recommends that you continue on your current medications as directed. Please refer to the Current Medication list given to you today.  If you need a refill on your cardiac medications before your next appointment, please call your pharmacy.   Lab work: None If you have labs (blood work) drawn today and your tests are completely normal, you will receive your results only by: [name] MyChart Message (if you have MyChart) OR . A paper copy in the mail If you have any lab test that is abnormal or we need to change your treatment, we will call you to review the results.  Testing/Procedures: None  Follow-Up: At [facility], you and your health needs are our priority.  As part of our continuing mission to provide you with exceptional heart care, we have created designated Provider Care Teams.  These Care Teams include your primary Cardiologist (physician) and Advanced Practice Providers (APPs -  Physician Assistants and Nurse Practitioners) who all work together to provide you with the care you need, when you need it. You will need a follow up appointment in 3 months.  Please call our office 2 months in advance to schedule this appointment.  You may see [name], MD or one of the following Advanced Practice Providers on your designated Care Team:   Truitt Merle, NP Cecilie Kicks, NP . Kathyrn Drown, NP  Any Other Special Instructions Will Be Listed Below (If Applicable).

## 2018-05-06 ENCOUNTER — Other Ambulatory Visit: Payer: Medicare HMO

## 2018-05-06 ENCOUNTER — Ambulatory Visit: Payer: Medicare HMO | Admitting: Oncology

## 2018-05-10 ENCOUNTER — Inpatient Hospital Stay: Payer: Medicare HMO | Attending: Oncology

## 2018-05-10 ENCOUNTER — Inpatient Hospital Stay (HOSPITAL_BASED_OUTPATIENT_CLINIC_OR_DEPARTMENT_OTHER): Payer: Medicare HMO | Admitting: Oncology

## 2018-05-10 DIAGNOSIS — I712 Thoracic aortic aneurysm, without rupture: Secondary | ICD-10-CM | POA: Insufficient documentation

## 2018-05-10 DIAGNOSIS — J90 Pleural effusion, not elsewhere classified: Secondary | ICD-10-CM | POA: Insufficient documentation

## 2018-05-10 DIAGNOSIS — Z79899 Other long term (current) drug therapy: Secondary | ICD-10-CM | POA: Diagnosis not present

## 2018-05-10 DIAGNOSIS — I700 Atherosclerosis of aorta: Secondary | ICD-10-CM

## 2018-05-10 DIAGNOSIS — D509 Iron deficiency anemia, unspecified: Secondary | ICD-10-CM

## 2018-05-10 DIAGNOSIS — C160 Malignant neoplasm of cardia: Secondary | ICD-10-CM

## 2018-05-10 DIAGNOSIS — Z7901 Long term (current) use of anticoagulants: Secondary | ICD-10-CM

## 2018-05-10 DIAGNOSIS — I4891 Unspecified atrial fibrillation: Secondary | ICD-10-CM | POA: Diagnosis not present

## 2018-05-10 DIAGNOSIS — C159 Malignant neoplasm of esophagus, unspecified: Secondary | ICD-10-CM

## 2018-05-10 DIAGNOSIS — C155 Malignant neoplasm of lower third of esophagus: Secondary | ICD-10-CM

## 2018-05-10 LAB — CBC WITH DIFFERENTIAL (CANCER CENTER ONLY)
Abs Immature Granulocytes: 0.06 10*3/uL (ref 0.00–0.07)
BASOS ABS: 0 10*3/uL (ref 0.0–0.1)
Basophils Relative: 1 %
EOS ABS: 0.1 10*3/uL (ref 0.0–0.5)
Eosinophils Relative: 1 %
HEMATOCRIT: 37.2 % — AB (ref 39.0–52.0)
Hemoglobin: 12.1 g/dL — ABNORMAL LOW (ref 13.0–17.0)
Immature Granulocytes: 1 %
LYMPHS ABS: 1.4 10*3/uL (ref 0.7–4.0)
Lymphocytes Relative: 22 %
MCH: 28 pg (ref 26.0–34.0)
MCHC: 32.5 g/dL (ref 30.0–36.0)
MCV: 86.1 fL (ref 80.0–100.0)
MONOS PCT: 12 %
Monocytes Absolute: 0.8 10*3/uL (ref 0.1–1.0)
NRBC: 0 % (ref 0.0–0.2)
Neutro Abs: 3.9 10*3/uL (ref 1.7–7.7)
Neutrophils Relative %: 63 %
Platelet Count: 220 10*3/uL (ref 150–400)
RBC: 4.32 MIL/uL (ref 4.22–5.81)
RDW: 15.5 % (ref 11.5–15.5)
WBC Count: 6.2 10*3/uL (ref 4.0–10.5)

## 2018-05-10 LAB — CMP (CANCER CENTER ONLY)
ALT: 15 U/L (ref 0–44)
AST: 18 U/L (ref 15–41)
Albumin: 3.4 g/dL — ABNORMAL LOW (ref 3.5–5.0)
Alkaline Phosphatase: 132 U/L — ABNORMAL HIGH (ref 38–126)
Anion gap: 8 (ref 5–15)
BUN: 23 mg/dL (ref 8–23)
CALCIUM: 9.2 mg/dL (ref 8.9–10.3)
CO2: 27 mmol/L (ref 22–32)
CREATININE: 1.48 mg/dL — AB (ref 0.61–1.24)
Chloride: 104 mmol/L (ref 98–111)
GFR, EST AFRICAN AMERICAN: 55 mL/min — AB (ref >60)
GFR, Estimated: 48 mL/min — ABNORMAL LOW (ref >60)
Glucose, Bld: 233 mg/dL — ABNORMAL HIGH (ref 70–99)
Potassium: 4.2 mmol/L (ref 3.5–5.1)
SODIUM: 139 mmol/L (ref 135–145)
Total Bilirubin: 0.6 mg/dL (ref 0.3–1.2)
Total Protein: 6.6 g/dL (ref 6.5–8.1)

## 2018-05-10 NOTE — Progress Notes (Signed)
Hematology and Oncology Follow Up Visit  Justin Blackburn [id number] 01-Mar-1951 67 y.o. 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     Prior Therapy: He is status post endoscopy and a biopsy in March 2019.  He is s/p  EUS for staging purposes on 09/29/2017 which resulted in T3N0 staging.    Radiation and weekly carboplatin/paclitaxel.  Week 1 of chemotherapy to start on 10/28/2017.  He completed therapy on December 11, 2017.  Current therapy: Active surveillance.  Interim History: [name] returns today for a repeat evaluation.  Since the last visit, he completed definitive therapy with radiation and weekly chemotherapy without any delayed complications.  He was not cleared from a cardiac standpoint to undergo esophageal resection.  He was hospitalized in August 2019 with respiratory failure and ARDS amiodarone toxicity.  He has recovered reasonably well at this time and is no longer requiring oxygen.  He is ambulating with the help of a cane without any falls or syncope.  He continues to have cardiac issues with atrial fibrillation and did require cardioversion.  He denies any hematochezia or melena.  He denies any major changes in his bowel habits.  He is eating better without any dysphasia or odontophagia.  He does not report any headaches, blurry vision, syncope or seizures.  He denies any confusion or lethargy.  Does not report any fevers, chills or sweats.   Does not report any cough, wheezing or hemoptysis.  Does not report any chest pain, palpitation, orthopnea or leg edema.  Does not report any nausea, vomiting or distention.   Denies any bone pain or pathological fractures.  Does not report frequency, urgency or hematuria.  No ecchymosis or petechiae.  Does not report any lymphadenopathy or easy bruising.  Does not report any mood changes.  Remaining review of systems is  negative.    Medications: I have reviewed the patient's current medications.  Current Outpatient Medications  Medication Sig Dispense Refill  . acetaminophen (TYLENOL) 325 MG tablet Take 2 tablets (650 mg total) by mouth every 6 (six) hours as needed for mild pain or headache.    [name] amiodarone (PACERONE) 200 MG tablet Take 1 tablet (200 mg total) by mouth daily. 90 tablet 3  . apixaban (ELIQUIS) 5 MG TABS tablet Take 5 mg by mouth 2 (two) times daily.    [name] atorvastatin (LIPITOR) 40 MG tablet Take 40 mg by mouth daily at 6 PM.   3  . carvedilol (COREG) 6.25 MG tablet Take 6.25 mg by mouth 2 (two) times daily with a meal.    . ferrous sulfate 325 (65 FE) MG tablet Take 325 mg by mouth daily.  3  . furosemide (LASIX) 40 MG tablet Take 1 tablet (40 mg total) by mouth 2 (two) times daily. 180 tablet 3  . gabapentin (NEURONTIN) 300 MG capsule Take 300 mg by mouth 2 (two) times daily.  1  . insulin degludec (TRESIBA FLEXTOUCH) 100 UNIT/ML SOPN FlexTouch Pen Inject 60 Units into the skin at bedtime.     [name] losartan (COZAAR) 100 MG tablet Take 1 tablet (100 mg total) by mouth daily. 90 tablet 3  . nitroGLYCERIN (NITROSTAT) 0.4 MG SL tablet PLACE 1 TABLET UNDER THE TONGUE EVERY 5 (FIVE) MINUTES AS NEEDED FOR CHEST PAIN (ANGINA). (Patient taking differently: Place 0.4 mg under the tongue every 5 (five) minutes as needed for chest pain. )  25 tablet 0  . pantoprazole (PROTONIX) 40 MG tablet Take 1 tablet (40 mg total) by mouth daily. 30 tablet 11  . potassium chloride SA (K-DUR,KLOR-CON) 20 MEQ tablet Take 1 tablet (20 mEq total) by mouth daily. 90 tablet 3   No current facility-administered medications for this visit.      Allergies: No Known Allergies  Past Medical History, Surgical history, Social history, and Family History were reviewed and updated.    Physical Exam: Blood pressure 136/71, pulse (!) 57, temperature 98 F (36.7 C), temperature source Oral, resp. rate 18, height 6' (1.829 m),  weight 208 lb 9.6 oz (94.6 kg), SpO2 99 %.    ECOG: 1   General appearance: Comfortable appearing without any discomfort Head: Normocephalic without any trauma Oropharynx: Mucous membranes are moist and pink without any thrush or ulcers. Eyes: Pupils are equal and round reactive to light. Lymph nodes: No cervical, supraclavicular, inguinal or axillary lymphadenopathy.   Heart:regular rate and rhythm.  S1 and S2 without leg edema. Lung: Clear without any rhonchi or wheezes.  No dullness to percussion. Abdomin: Soft, nontender, nondistended with good bowel sounds.  No hepatosplenomegaly. Musculoskeletal: No joint deformity or effusion.  Full range of motion noted. Neurological: No deficits noted on motor, sensory and deep tendon reflex exam. Skin: No petechial rash or dryness.  Appeared moist.  Psychiatric: Mood and affect appeared appropriate.      Lab Results: Lab Results  Component Value Date   WBC 6.2 05/10/2018   HGB 12.1 (L) 05/10/2018   HCT 37.2 (L) 05/10/2018   MCV 86.1 05/10/2018   PLT 220 05/10/2018     Chemistry      Component Value Date/Time   NA 138 04/19/2018 0839   K 3.5 04/19/2018 0839   CL 103 04/19/2018 0839   CO2 28 04/19/2018 0839   BUN 13 04/19/2018 0839   CREATININE 1.06 04/19/2018 0839   CREATININE 1.01 12/02/2017 0733      Component Value Date/Time   CALCIUM 8.8 04/19/2018 0839   ALKPHOS 169 (H) 01/14/2018 0707   AST 43 (H) 01/14/2018 0707   AST 17 12/02/2017 0733   ALT 25 01/14/2018 0707   ALT 11 12/02/2017 0733   BILITOT 0.9 01/14/2018 0707   BILITOT 0.8 12/02/2017 0733     EXAM: CT CHEST WITHOUT CONTRAST  TECHNIQUE: Multidetector CT imaging of the chest was performed following the standard protocol without IV contrast.  COMPARISON:  CT scan of January 16, 2018.  FINDINGS: Cardiovascular: 4.7 cm ascending thoracic aortic aneurysm is noted. Atherosclerosis of thoracic aorta is noted. Status post coronary artery bypass graft.  No pericardial effusion is noted.  Mediastinum/Nodes: Thyroid gland is unremarkable. Stable 15 mm subcarinal lymph node is noted. Calcific densities are seen involving the distal esophagus and proximal stomach which may represent post radiation changes.  Lungs/Pleura: No pneumothorax is noted. Minimal bilateral pleural effusions are noted. Mildly improved bilateral lower lobe opacities are noted suggesting improving pneumonia or edema.  Upper Abdomen: No acute abnormality.  Musculoskeletal: No chest wall mass or suspicious bone lesions identified.  IMPRESSION: Mildly improved bilateral lower lobe opacities are noted suggesting improving pneumonia or edema. Minimal bilateral pleural effusions are noted.  4.7 cm ascending thoracic aortic aneurysm. Recommend semi-annual imaging followup by CTA or MRA and referral to cardiothoracic surgery if not already obtained. This recommendation follows 2010 ACCF/AHA/AATS/ACR/ASA/SCA/SCAI/SIR/STS/SVM Guidelines for the Diagnosis and Management of Patients With Thoracic Aortic Disease. Circulation. 2010; 121: W098-J191.  Probable post therapy changes are  seen involving the distal esophagus and proximal stomach. Stable 15 mm subcarinal lymph node is noted.  Aortic Atherosclerosis (ICD10-I70.0).   Impression and Plan:  67 year old man with:  1.  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    He completed definitive therapy with radiation and weekly chemotherapy without any major complications.  He was felt to be not a surgical candidate curative cardiovascular status.  The natural course of this disease was reviewed today and the role for additional therapy was discussed.  At this time, any further therapy will be indicated only if he develops relapse disease.  CT scan on March 15, 2018 did not show any evidence of malignancy.  The plan is to repeat imaging studies in 3 to 4 months and continue with active  surveillance.  Is agreeable to with this plan.  2.  Iron deficiency anemia: Appears to have resolved at this time hemoglobin near normal.  3.  Dysphasia and weight loss: Resolved at this time and is eating well.   4.  Respiratory failure: Improved since his recent hospitalization is no longer on oxygen.  5.  Follow-up: We will be in 4 months for repeat imaging studies.  15  minutes was spent with the patient face-to-face today.  More than 50% of time was dedicated to discussing his disease status, reviewing imaging studies and answering questions regarding plan of care.  Zola Button, MD 11/26/20199:37 AM

## 2018-05-17 DIAGNOSIS — Z6828 Body mass index (BMI) 28.0-28.9, adult: Secondary | ICD-10-CM | POA: Diagnosis not present

## 2018-05-17 DIAGNOSIS — I2510 Atherosclerotic heart disease of native coronary artery without angina pectoris: Secondary | ICD-10-CM | POA: Diagnosis not present

## 2018-05-17 DIAGNOSIS — E1140 Type 2 diabetes mellitus with diabetic neuropathy, unspecified: Secondary | ICD-10-CM | POA: Diagnosis not present

## 2018-05-17 DIAGNOSIS — R001 Bradycardia, unspecified: Secondary | ICD-10-CM | POA: Diagnosis not present

## 2018-05-17 DIAGNOSIS — R42 Dizziness and giddiness: Secondary | ICD-10-CM | POA: Diagnosis not present

## 2018-05-24 DIAGNOSIS — R69 Illness, unspecified: Secondary | ICD-10-CM | POA: Diagnosis not present

## 2018-05-25 ENCOUNTER — Telehealth: Payer: Self-pay | Admitting: Interventional Cardiology

## 2018-05-25 DIAGNOSIS — R69 Illness, unspecified: Secondary | ICD-10-CM | POA: Diagnosis not present

## 2018-05-25 NOTE — Telephone Encounter (Signed)
New message  STAT if patient feels like he/she is going to faint   1) Are you dizzy now?yes   2) Do you feel faint or have you passed out?patient's wife states that he feels faint at this time  3) Do you have any other symptoms? Lightheadedness,  4) Have you checked your HR and BP (record if available)? No

## 2018-05-25 NOTE — Telephone Encounter (Signed)
Reviewed with Dr. Tamala Julian and pt should stop Coreg.  Also need to confirm pt is taking amiodarone 200 mg daily.  I spoke with pt's wife and gave her instructions for pt to stop Coreg.  She confirms pt is taking amiodarone 200 mg daily.

## 2018-05-25 NOTE — Telephone Encounter (Signed)
Received call transferred directly from operator and spoke with pt's wife. She reports pt feels like he is going to pass out.  I advised to call EMS but pt refuses this. Wife reports oxygen sat is 97 and BP is 116/60.  Heart rate is 41 by pulse ox and is regular. Pt saw primary care last week and heart rate was 41 at that time. Coreg dose was cut in half.  I also spoke with pt who reports he is feeling better now and that dizziness comes and goes.  Happens 2-3 times per day. Has been going on for a few weeks.  Has not worsened since office visit in primary care last week.  I told pt I would send message to Dr. Tamala Julian and would call him back with his recommendations. I advised them to call EMS if he feels he is going to pass out.

## 2018-07-20 DIAGNOSIS — E1140 Type 2 diabetes mellitus with diabetic neuropathy, unspecified: Secondary | ICD-10-CM | POA: Diagnosis not present

## 2018-07-20 DIAGNOSIS — I10 Essential (primary) hypertension: Secondary | ICD-10-CM | POA: Diagnosis not present

## 2018-07-20 DIAGNOSIS — I480 Paroxysmal atrial fibrillation: Secondary | ICD-10-CM | POA: Diagnosis not present

## 2018-07-20 DIAGNOSIS — I25119 Atherosclerotic heart disease of native coronary artery with unspecified angina pectoris: Secondary | ICD-10-CM | POA: Diagnosis not present

## 2018-07-20 DIAGNOSIS — R42 Dizziness and giddiness: Secondary | ICD-10-CM | POA: Diagnosis not present

## 2018-07-20 DIAGNOSIS — C160 Malignant neoplasm of cardia: Secondary | ICD-10-CM | POA: Diagnosis not present

## 2018-07-20 DIAGNOSIS — Z6829 Body mass index (BMI) 29.0-29.9, adult: Secondary | ICD-10-CM | POA: Diagnosis not present

## 2018-07-29 ENCOUNTER — Other Ambulatory Visit (HOSPITAL_COMMUNITY): Payer: Self-pay | Admitting: Cardiology

## 2018-08-08 NOTE — Progress Notes (Signed)
Cardiology Office Note:    Date:  08/09/2018   ID:  Justin Kida., DOB Oct 30, 1950, MRN [id number]  PCP:  [name], PA-C  Cardiologist:  [name], MD   Referring MD: [name], PA-C   Chief Complaint  Patient presents with  . Coronary Artery Disease  . Congestive Heart Failure    History of Present Illness:    Justin Prime. is a 68 y.o. male with a hx of CAD, CABG x2, recent left main and circumflex DES, upper GI bleed related to previously undiagnosed esophageal cancer, chemotherapy, paroxysmal atrial fibrillation, anticoagulation therapy, acute on chronic systolic and diastolic heart failure related to atrial fibrillation, returning after recent admission for GI bleed.  Skip notes increasing episodes of left shoulder discomfort and chest pressure.  He is not having these symptoms every day.  These episodes occur with moderate activity.  After our last office visit, I encouraged that he use nitroglycerin to help hasten relief of discomfort.  He has done this and does note that both the shoulder discomfort and chest discomfort resolved within minutes of a sublingual nitroglycerin.  He has not had an episode yet this week.  He is not as physically active as prior.  Feels he is gotten his strength back following radiation therapy for esophageal cancer.  He will be seeing Dr. Alen Blew within the next month.  Further therapy will be organized at that time if indicated.  Past Medical History:  Diagnosis Date  . A-fib ([place])    with RVR  . Acute kidney injury ([place])   . Anemia   . Angina   . Arthritis    "hands" (08/10/2017)  . Atrial flutter, paroxysmal ([place])    arrived with aflutter with RVR, EF down to 25-30%, s/p TEE DCCV on 01/29/2015  . Cancer ([place])   . CHF (congestive heart failure) ([place])   . Chronic lower back pain    "all the time from my sciatic nerve"  . Coronary artery disease   . DVT (deep venous thrombosis) ([place])   . Dyspnea   . Erectile  dysfunction   . Heart failure ([place])   . Hypercholesteremia   . Hypertension   . 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 Myocardial infarction ([place]) 1988; ~0630;  ~1996;~ 2000  . Obesity   . Peripheral vascular disease ([place])   . Umbilical hernia    unrepaired    Past Surgical History:  Procedure Laterality Date  . CARDIOVERSION N/A 01/29/2015   Procedure: CARDIOVERSION;  Surgeon: [name], MD;  Location: [facility];  Service: Cardiovascular;  Laterality: N/A;  . CARDIOVERSION N/A 04/26/2018   Procedure: CARDIOVERSION;  Surgeon: Josue Hector, MD;  Location: [facility] ENDOSCOPY;  Service: Cardiovascular;  Laterality: N/A;  . CORONARY ANGIOPLASTY WITH STENT PLACEMENT  08/31/11   "2 today; makes total of 3 stents"  . CORONARY ANGIOPLASTY WITH STENT PLACEMENT  08/10/2017   "2 today; makes a total of 5 stents" (08/10/2017)  . CORONARY ARTERY BYPASS GRAFT  1998; 2007   CABG X 4; CABG X4  . CORONARY STENT INTERVENTION N/A 08/10/2017   Procedure: CORONARY STENT INTERVENTION;  Surgeon: Sherren Mocha, MD;  Location: [place] CV LAB;  Service: Cardiovascular;  Laterality: N/A;  . ESOPHAGOGASTRODUODENOSCOPY (EGD) WITH PROPOFOL N/A 09/01/2017   Procedure: ESOPHAGOGASTRODUODENOSCOPY (EGD) WITH PROPOFOL;  Surgeon: Laurence Spates, MD;  Location: [facility];  Service: Endoscopy;  Laterality: N/A;  . EUS N/A 09/29/2017   Procedure: UPPER  ENDOSCOPIC ULTRASOUND (EUS) LINEAR;  Surgeon: Arta Silence, MD;  Location: [facility] ENDOSCOPY;  Service: Endoscopy;  Laterality: N/A;  . LEFT HEART CATH AND CORS/GRAFTS ANGIOGRAPHY N/A 08/10/2017   Procedure: LEFT HEART CATH AND CORS/GRAFTS ANGIOGRAPHY;  Surgeon: Sherren Mocha, MD;  Location: [facility] CV LAB;  Service: Cardiovascular;  Laterality: N/A;  . LEFT HEART CATHETERIZATION WITH CORONARY/GRAFT ANGIOGRAM N/A 08/31/2011   Procedure: LEFT HEART CATHETERIZATION WITH Beatrix Fetters;  Surgeon: [name], MD;  Location: [facility]  CATH LAB;  Service: Cardiovascular;  Laterality: N/A;  . PERIPHERAL VASCULAR CATHETERIZATION N/A 04/27/2016   Procedure: Abdominal Aortogram w/Lower Extremity;  Surgeon: Angelia Mould, MD;  Location: [place] CV LAB;  Service: Cardiovascular;  Laterality: N/A;  . TEE WITHOUT CARDIOVERSION N/A 01/29/2015   Procedure: TRANSESOPHAGEAL ECHOCARDIOGRAM (TEE);  Surgeon: [name], MD;  Location: [facility] ENDOSCOPY;  Service: Cardiovascular;  Laterality: N/A;    Current Medications: Current Meds  Medication Sig  . acetaminophen (TYLENOL) 325 MG tablet Take 2 tablets (650 mg total) by mouth every 6 (six) hours as needed for mild pain or headache.  [name] amiodarone (PACERONE) 200 MG tablet Take 1 tablet (200 mg total) by mouth daily.  [name] apixaban (ELIQUIS) 5 MG TABS tablet Take 5 mg by mouth 2 (two) times daily.  [name] atorvastatin (LIPITOR) 40 MG tablet Take 40 mg by mouth daily at 6 PM.   . ferrous sulfate 325 (65 FE) MG tablet Take 325 mg by mouth daily.  . furosemide (LASIX) 40 MG tablet Take 1 tablet (40 mg total) by mouth daily.  [name] gabapentin (NEURONTIN) 300 MG capsule Take 600 mg by mouth 2 (two) times daily.   . insulin degludec (TRESIBA FLEXTOUCH) 100 UNIT/ML SOPN FlexTouch Pen Inject 60 Units into the skin at bedtime.   [name] losartan (COZAAR) 100 MG tablet Take 50 mg by mouth daily.  . metFORMIN (GLUCOPHAGE) 1000 MG tablet Take 1,000 mg by mouth 2 (two) times daily with a meal.  . nitroGLYCERIN (NITROSTAT) 0.4 MG SL tablet PLACE 1 TABLET UNDER THE TONGUE EVERY 5 (FIVE) MINUTES AS NEEDED FOR CHEST PAIN (ANGINA).  [name] pantoprazole (PROTONIX) 40 MG tablet Take 1 tablet (40 mg total) by mouth daily.  . potassium chloride SA (K-DUR,KLOR-CON) 20 MEQ tablet Take 1 tablet (20 mEq total) by mouth daily.  . [DISCONTINUED] furosemide (LASIX) 40 MG tablet Take 1 tablet (40 mg total) by mouth 2 (two) times daily.     Allergies:   Patient has no known allergies.   Social History   Socioeconomic History    . Marital status: Married    Spouse name: Not on file  . Number of children: Not on file  . Years of education: Not on file  . Highest education level: Not on file  Occupational History  . Not on file  Social Needs  . Financial resource strain: Not on file  . Food insecurity:    Worry: Not on file    Inability: Not on file  . Transportation needs:    Medical: Not on file    Non-medical: Not on file  Tobacco Use  . Smoking status: Former Smoker    Packs/day: 2.00    Years: 6.00    Pack years: 12.00    Types: Cigarettes    Last attempt to quit: 06/16/1971    Years since quitting: 47.1  . Smokeless tobacco: Never Used  . Tobacco comment: "chewed on cigars; never really did chew tobacco"  Substance and Sexual Activity  .  Alcohol use: No  . Drug use: No  . Sexual activity: Never  Lifestyle  . Physical activity:    Days per week: Not on file    Minutes per session: Not on file  . Stress: Not on file  Relationships  . Social connections:    Talks on phone: Not on file    Gets together: Not on file    Attends religious service: Not on file    Active member of club or organization: Not on file    Attends meetings of clubs or organizations: Not on file    Relationship status: Not on file  Other Topics Concern  . Not on file  Social History Narrative  . Not on file     Family History: The patient's family history includes Diabetes in his father and mother; Heart attack in his brother, father, and mother; Heart disease in his father and mother. There is no history of Stroke.  ROS:   Please see the history of present illness.    Frequent urination, back pain, muscle pain, orthostatic dizziness, easy bruising, difficulty with balance, decreased short-term memory, but has not noticed melena or bright red blood in the stool.  All other systems reviewed and are negative.  EKGs/Labs/Other Studies Reviewed:    The following studies were reviewed today: No new data  EKG:  EKG  sinus rhythm, QS pattern V1 through V4.  Also inferior Q wave pattern.  Sinus bradycardia with normal PR interval.  When compared to prior November 2019, no changes occurred.  Recent Labs: 01/14/2018: B Natriuretic Peptide 781.3 01/19/2018: TSH 0.111 01/23/2018: Magnesium 1.9 03/03/2018: NT-Pro BNP 1,970 05/10/2018: ALT 15; BUN 23; Creatinine 1.48; Hemoglobin 12.1; Platelet Count 220; Potassium 4.2; Sodium 139  Recent Lipid Panel    Component Value Date/Time   CHOL 149 12/07/2017 0336   CHOL 129 09/21/2017 1352   TRIG 98 12/07/2017 0336   HDL 33 (L) 12/07/2017 0336   HDL 31 (L) 09/21/2017 1352   CHOLHDL 4.5 12/07/2017 0336   VLDL 20 12/07/2017 0336   LDLCALC 96 12/07/2017 0336   LDLCALC 70 09/21/2017 1352    Physical Exam:    VS:  BP 118/68   Pulse 61   Ht 6' (1.829 m)   Wt 210 lb 12.8 oz (95.6 kg)   SpO2 99%   BMI 28.59 kg/m     Wt Readings from Last 3 Encounters:  08/09/18 210 lb 12.8 oz (95.6 kg)  05/10/18 208 lb 9.6 oz (94.6 kg)  05/05/18 210 lb 6.4 oz (95.4 kg)     GEN: Healthy appearing.  Full beard present.. No acute distress HEENT: Normal NECK: No JVD. LYMPHATICS: No lymphadenopathy CARDIAC: RRR.  Apical systolic mitral regurgitation murmur graded 2/6 without diastolic murmur, positive fast for gallop, no edema VASCULAR: Bilateral 2+ radial pulses, no bruits RESPIRATORY:  Clear to auscultation without rales, wheezing or rhonchi  ABDOMEN: Soft, non-tender, non-distended, No pulsatile mass, MUSCULOSKELETAL: No deformity  SKIN: Warm and dry NEUROLOGIC:  Alert and oriented x 3 PSYCHIATRIC:  Normal affect   ASSESSMENT:    1. Chronic combined systolic and diastolic heart failure, NYHA class 2 ([place])   2. Coronary artery disease involving coronary bypass graft of native heart without angina pectoris   3. Atrial flutter, unspecified type ([place])   4. Anticoagulated   5. Peripheral vascular disease ([place])   6. Type 2 diabetes mellitus with other circulatory  complication, with long-term current use of insulin ([place])   7. Essential hypertension  8. H/O amiodarone therapy    PLAN:    In order of problems listed above:  1. No evidence of volume overload.  With orthostatic dizziness, concerned he may be slightly volume depleted.  Decrease furosemide to 40 mg daily.  Weigh daily.  If any increase in shortness of breath or edema, will need to increase furosemide back to 60 mg or perhaps all the way back to 80 mg daily.  2-week follow-up.  Basic metabolic panel at that time. 2. Angina is increasing in intensity/frequency.  Some concern about in-stent restenosis of the protected left main.  Will monitor.  Encourage nitroglycerin use. 3. No recent recurrences of atrial flutter. 4. No bleeding on anticoagulation. 5. Not addressed 6. Hemoglobin A1c is greater than the target of 7. 7. Adequate blood pressure control. 8. Needs a TSH and hepatic panel done every 6 months.  Follow-up in 2 to 4 weeks with myself or team member.  Be met will be done at that time.  Reassess volume status on lower dose furosemide.  We will also do a TSH.   Medication Adjustments/Labs and Tests Ordered: Current medicines are reviewed at length with the patient today.  Concerns regarding medicines are outlined above.  Orders Placed This Encounter  Procedures  . EKG 12-Lead   Meds ordered this encounter  Medications  . furosemide (LASIX) 40 MG tablet    Sig: Take 1 tablet (40 mg total) by mouth daily.    Dispense:  90 tablet    Refill:  3    Dose change    Patient Instructions  Medication Instructions:  1) DECREASE Furosemide to '40mg'$  once daily  If you need a refill on your cardiac medications before your next appointment, please call your pharmacy.   Lab work: You will need at BMET at time of follow up  If you have labs (blood work) drawn today and your tests are completely normal, you will receive your results only by: [name] MyChart Message (if you have MyChart)  OR . A paper copy in the mail If you have any lab test that is abnormal or we need to change your treatment, we will call you to review the results.  Testing/Procedures: None  Follow-Up: At [facility], you and your health needs are our priority.  As part of our continuing mission to provide you with exceptional heart care, we have created designated Provider Care Teams.  These Care Teams include your primary Cardiologist (physician) and Advanced Practice Providers (APPs -  Physician Assistants and Nurse Practitioners) who all work together to provide you with the care you need, when you need it. You will need a follow up appointment in 2-4 weeks.  Please call our office 2 months in advance to schedule this appointment.  You may see [name], MD or one of the following Advanced Practice Providers on your designated Care Team:   Truitt Merle, NP Cecilie Kicks, NP . Kathyrn Drown, NP  Any Other Special Instructions Will Be Listed Below (If Applicable).  Make sure you are weighing everyday at the same time with the same amount of clothing on.  Please contact the office if you gain 3 pounds overnight or 5 pounds in a week, experience shortness of breath or swelling.      Signed, [name], MD  08/09/2018 10:45 AM    [place]

## 2018-08-09 ENCOUNTER — Ambulatory Visit (INDEPENDENT_AMBULATORY_CARE_PROVIDER_SITE_OTHER): Payer: Medicare HMO | Admitting: Interventional Cardiology

## 2018-08-09 ENCOUNTER — Encounter: Payer: Self-pay | Admitting: Interventional Cardiology

## 2018-08-09 VITALS — BP 118/68 | HR 61 | Ht 72.0 in | Wt 210.8 lb

## 2018-08-09 DIAGNOSIS — Z9229 Personal history of other drug therapy: Secondary | ICD-10-CM

## 2018-08-09 DIAGNOSIS — I4892 Unspecified atrial flutter: Secondary | ICD-10-CM | POA: Diagnosis not present

## 2018-08-09 DIAGNOSIS — I5042 Chronic combined systolic (congestive) and diastolic (congestive) heart failure: Secondary | ICD-10-CM | POA: Diagnosis not present

## 2018-08-09 DIAGNOSIS — I25810 Atherosclerosis of coronary artery bypass graft(s) without angina pectoris: Secondary | ICD-10-CM

## 2018-08-09 DIAGNOSIS — I10 Essential (primary) hypertension: Secondary | ICD-10-CM

## 2018-08-09 DIAGNOSIS — Z7901 Long term (current) use of anticoagulants: Secondary | ICD-10-CM | POA: Diagnosis not present

## 2018-08-09 DIAGNOSIS — E1159 Type 2 diabetes mellitus with other circulatory complications: Secondary | ICD-10-CM

## 2018-08-09 DIAGNOSIS — Z794 Long term (current) use of insulin: Secondary | ICD-10-CM

## 2018-08-09 DIAGNOSIS — I739 Peripheral vascular disease, unspecified: Secondary | ICD-10-CM

## 2018-08-09 MED ORDER — FUROSEMIDE 40 MG PO TABS: 40.0000 mg | ORAL_TABLET | Freq: Every day | ORAL | 3 refills | Status: DC

## 2018-08-09 NOTE — Patient Instructions (Signed)
Medication Instructions:  1) DECREASE Furosemide to 40mg  once daily  If you need a refill on your cardiac medications before your next appointment, please call your pharmacy.   Lab work: You will need at BMET at time of follow up  If you have labs (blood work) drawn today and your tests are completely normal, you will receive your results only by: [name] MyChart Message (if you have MyChart) OR . A paper copy in the mail If you have any lab test that is abnormal or we need to change your treatment, we will call you to review the results.  Testing/Procedures: None  Follow-Up: At [facility], you and your health needs are our priority.  As part of our continuing mission to provide you with exceptional heart care, we have created designated Provider Care Teams.  These Care Teams include your primary Cardiologist (physician) and Advanced Practice Providers (APPs -  Physician Assistants and Nurse Practitioners) who all work together to provide you with the care you need, when you need it. You will need a follow up appointment in 2-4 weeks.  Please call our office 2 months in advance to schedule this appointment.  You may see [name], MD or one of the following Advanced Practice Providers on your designated Care Team:   Truitt Merle, NP Cecilie Kicks, NP . Kathyrn Drown, NP  Any Other Special Instructions Will Be Listed Below (If Applicable).  Make sure you are weighing everyday at the same time with the same amount of clothing on.  Please contact the office if you gain 3 pounds overnight or 5 pounds in a week, experience shortness of breath or swelling.

## 2018-08-10 ENCOUNTER — Encounter: Payer: Self-pay | Admitting: Nurse Practitioner

## 2018-08-16 DIAGNOSIS — E113393 Type 2 diabetes mellitus with moderate nonproliferative diabetic retinopathy without macular edema, bilateral: Secondary | ICD-10-CM | POA: Diagnosis not present

## 2018-08-16 DIAGNOSIS — E083393 Diabetes mellitus due to underlying condition with moderate nonproliferative diabetic retinopathy without macular edema, bilateral: Secondary | ICD-10-CM | POA: Diagnosis not present

## 2018-08-16 DIAGNOSIS — H40013 Open angle with borderline findings, low risk, bilateral: Secondary | ICD-10-CM | POA: Diagnosis not present

## 2018-08-16 DIAGNOSIS — H2513 Age-related nuclear cataract, bilateral: Secondary | ICD-10-CM | POA: Diagnosis not present

## 2018-08-16 DIAGNOSIS — H524 Presbyopia: Secondary | ICD-10-CM | POA: Diagnosis not present

## 2018-08-20 DIAGNOSIS — R69 Illness, unspecified: Secondary | ICD-10-CM | POA: Diagnosis not present

## 2018-08-29 IMAGING — DX DG CHEST 1V PORT
1 series · 1 of 1 positions shown · non-contrast
Comparison: CT 01/06/2018.

CLINICAL DATA: Patient fell from bed. Low O2 sats. Occasional
cough.

EXAM:
PORTABLE CHEST 1 VIEW

[chest]
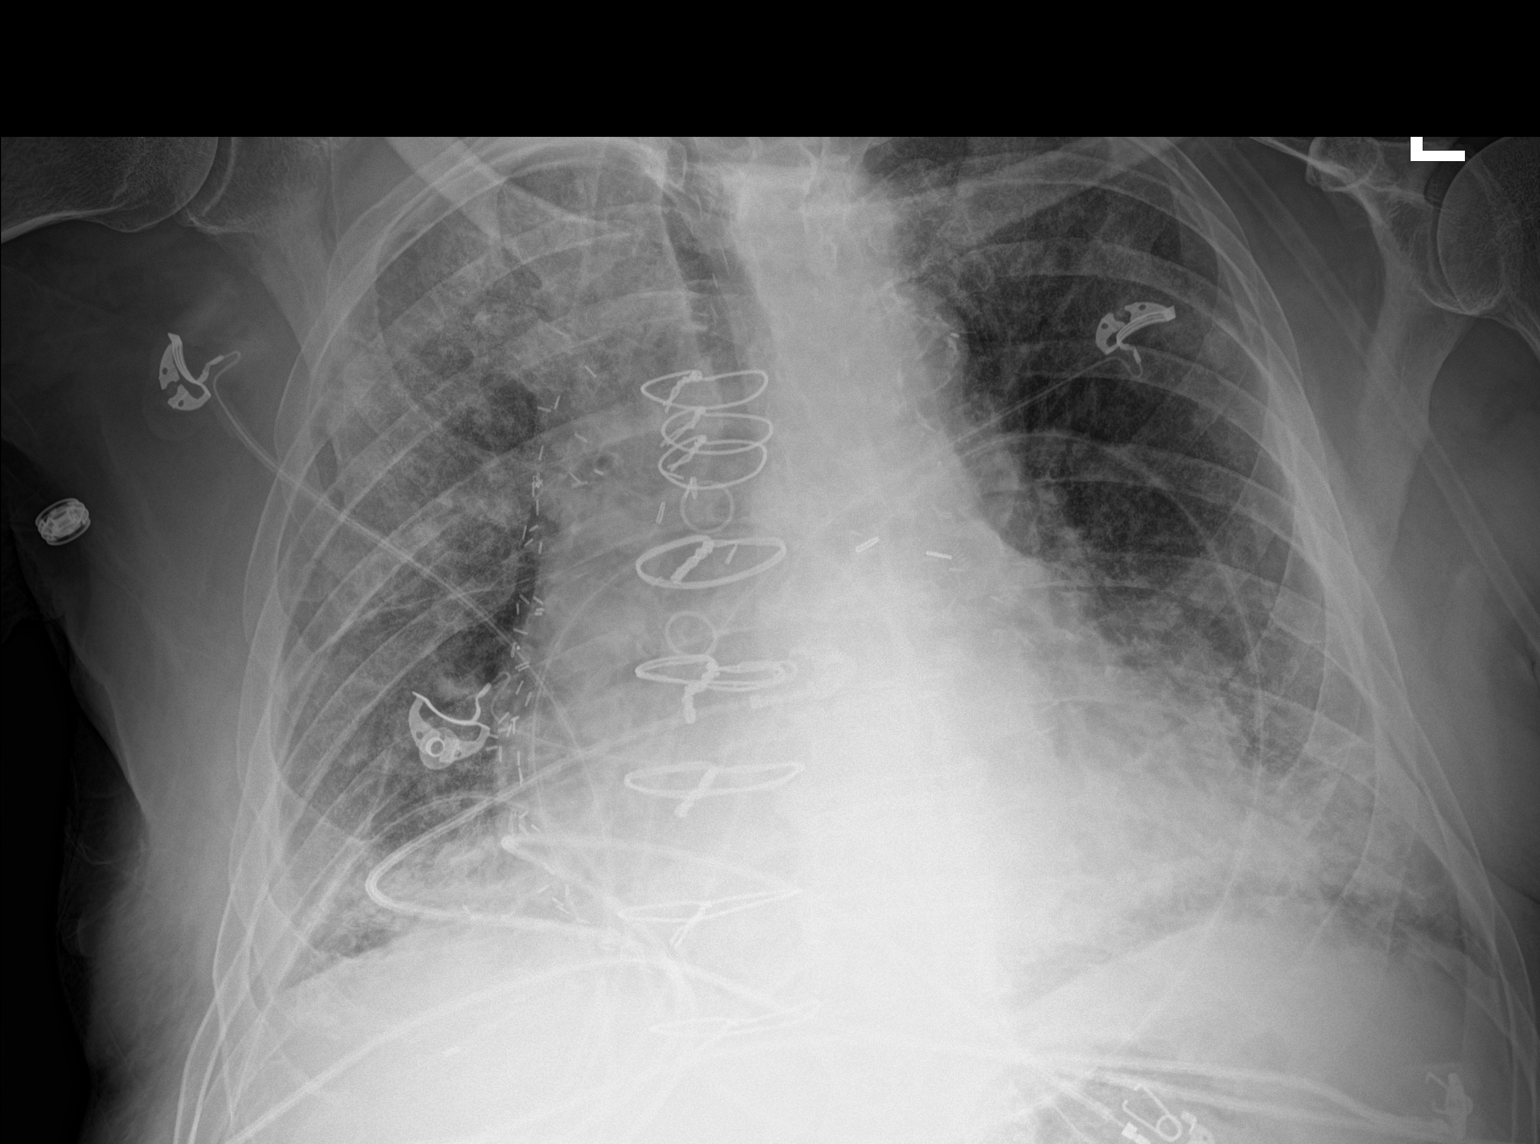

[1 of 1 positions shown; findings below may reference images not displayed]

FINDINGS: Prior CABG. Cardiomegaly. Diffuse bilateral pulmonary infiltrates
most consistent pulmonary edema. Bilateral pneumonia cannot be
excluded. Small right pleural effusion. No pneumothorax. No acute
bony abnormality identified.
IMPRESSION: Prior CABG. Cardiomegaly with diffuse bilateral pulmonary
infiltrates most consistent with pulmonary edema. Small right
pleural effusion.

## 2018-08-30 ENCOUNTER — Ambulatory Visit: Payer: Medicare HMO | Admitting: Nurse Practitioner

## 2018-08-30 IMAGING — CR DG CHEST 2V
2 series · 2 of 2 positions shown · non-contrast
Comparison: 01/14/2018; 12/07/2017; chest CT-01/06/2018

CLINICAL DATA: Cough and hypoxia. History of atrial fibrillation,
CHF, hypertension and diabetes.

EXAM:
CHEST - 2 VIEW

[chest lat]
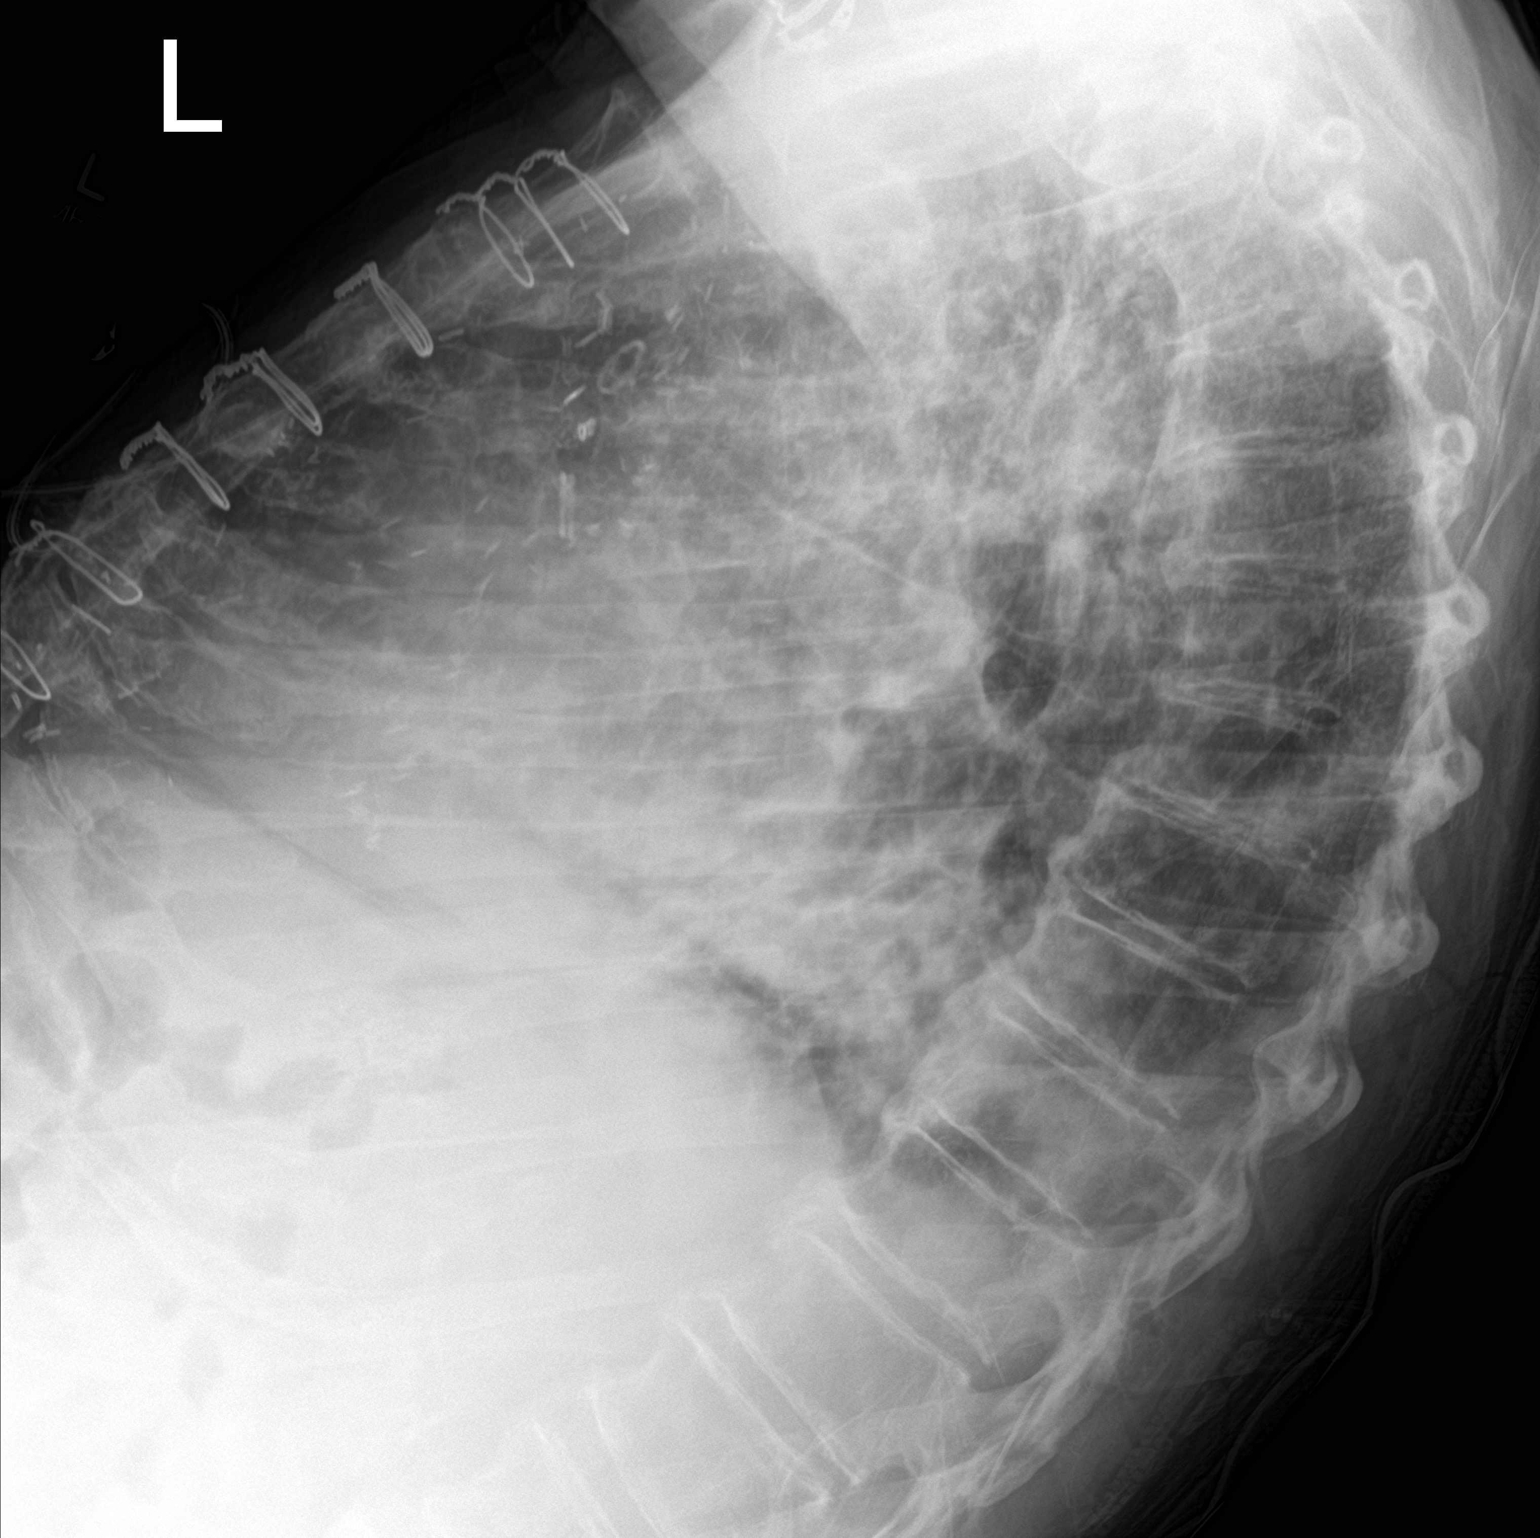

[chest ap]
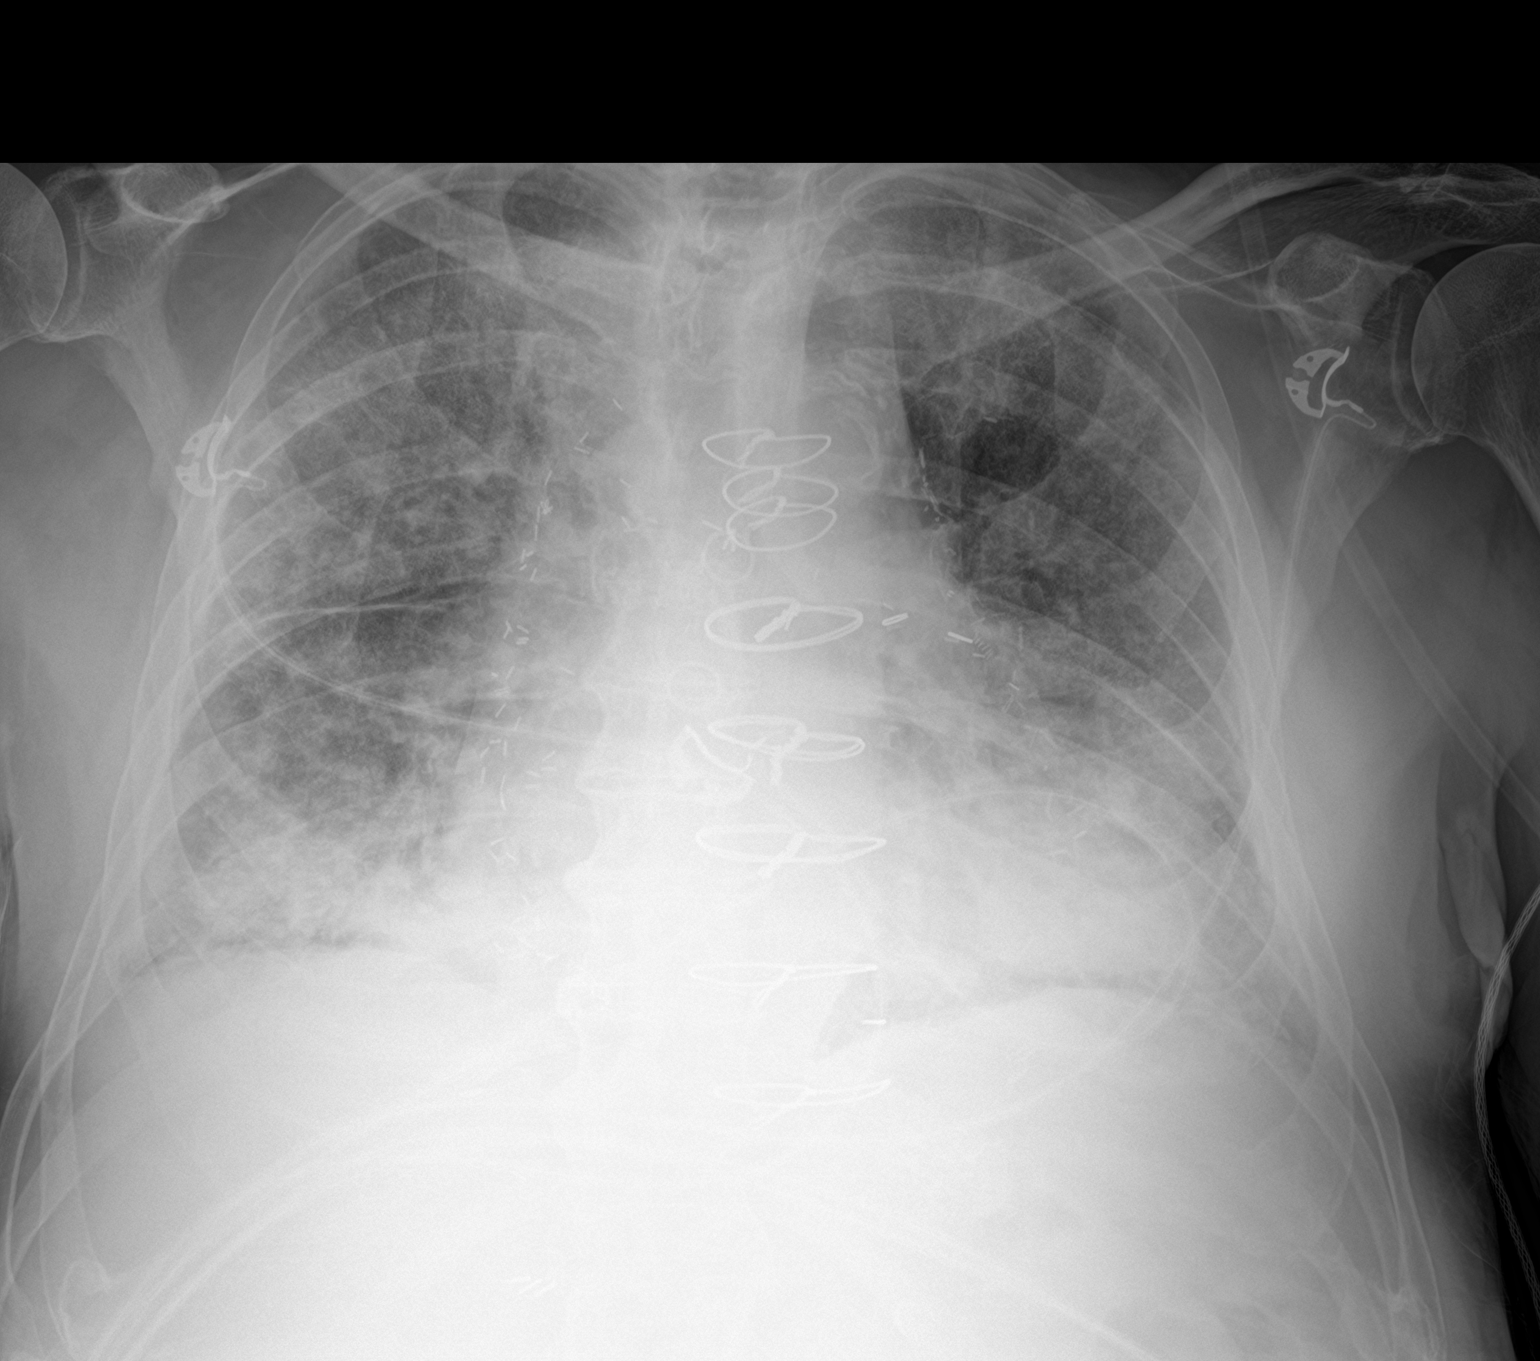

[2 of 2 positions shown; findings below may reference images not displayed]

FINDINGS: Grossly unchanged enlarged cardiac silhouette and mediastinal
contours with atherosclerotic plaque within the thoracic aorta. Post
median sternotomy and CABG. The pulmonary vasculature is indistinct
with cephalization of flow. Rather extensive bilateral mid and lower
lung heterogeneous/consolidative opacities. Trace pleural effusions
are not excluded. No pneumothorax. No acute osseus abnormalities.
IMPRESSION: Findings most worrisome for alveloar pulmonary edema though note,
multifocal infection could have a similar appearance. Clinical
correlation is advised.

## 2018-08-31 ENCOUNTER — Ambulatory Visit (HOSPITAL_COMMUNITY)
Admission: RE | Admit: 2018-08-31 | Discharge: 2018-08-31 | Disposition: A | Discharge status: Home / Self Care | Payer: Medicare HMO | Source: Ambulatory Visit | Attending: Oncology | Admitting: Oncology

## 2018-08-31 ENCOUNTER — Other Ambulatory Visit: Payer: Self-pay

## 2018-08-31 ENCOUNTER — Inpatient Hospital Stay: Payer: Medicare HMO | Attending: Oncology

## 2018-08-31 DIAGNOSIS — Z794 Long term (current) use of insulin: Secondary | ICD-10-CM | POA: Insufficient documentation

## 2018-08-31 DIAGNOSIS — Z9221 Personal history of antineoplastic chemotherapy: Secondary | ICD-10-CM | POA: Insufficient documentation

## 2018-08-31 DIAGNOSIS — Z8501 Personal history of malignant neoplasm of esophagus: Secondary | ICD-10-CM | POA: Insufficient documentation

## 2018-08-31 DIAGNOSIS — R42 Dizziness and giddiness: Secondary | ICD-10-CM | POA: Insufficient documentation

## 2018-08-31 DIAGNOSIS — Z923 Personal history of irradiation: Secondary | ICD-10-CM | POA: Diagnosis not present

## 2018-08-31 DIAGNOSIS — Z7901 Long term (current) use of anticoagulants: Secondary | ICD-10-CM | POA: Diagnosis not present

## 2018-08-31 DIAGNOSIS — R634 Abnormal weight loss: Secondary | ICD-10-CM | POA: Insufficient documentation

## 2018-08-31 DIAGNOSIS — Y842 Radiological procedure and radiotherapy as the cause of abnormal reaction of the patient, or of later complication, without mention of misadventure at the time of the procedure: Secondary | ICD-10-CM | POA: Insufficient documentation

## 2018-08-31 DIAGNOSIS — C159 Malignant neoplasm of esophagus, unspecified: Secondary | ICD-10-CM

## 2018-08-31 DIAGNOSIS — Z79899 Other long term (current) drug therapy: Secondary | ICD-10-CM | POA: Diagnosis not present

## 2018-08-31 LAB — CBC WITH DIFFERENTIAL (CANCER CENTER ONLY)
ABS IMMATURE GRANULOCYTES: 0.03 10*3/uL (ref 0.00–0.07)
BASOS PCT: 1 %
Basophils Absolute: 0.1 10*3/uL (ref 0.0–0.1)
Eosinophils Absolute: 0.1 10*3/uL (ref 0.0–0.5)
Eosinophils Relative: 1 %
HCT: 41.2 % (ref 39.0–52.0)
HEMOGLOBIN: 13.1 g/dL (ref 13.0–17.0)
Immature Granulocytes: 0 %
LYMPHS PCT: 26 %
Lymphs Abs: 1.9 10*3/uL (ref 0.7–4.0)
MCH: 27.5 pg (ref 26.0–34.0)
MCHC: 31.8 g/dL (ref 30.0–36.0)
MCV: 86.6 fL (ref 80.0–100.0)
MONO ABS: 0.8 10*3/uL (ref 0.1–1.0)
Monocytes Relative: 11 %
NEUTROS ABS: 4.5 10*3/uL (ref 1.7–7.7)
NEUTROS PCT: 61 %
PLATELETS: 236 10*3/uL (ref 150–400)
RBC: 4.76 MIL/uL (ref 4.22–5.81)
RDW: 16.1 % — ABNORMAL HIGH (ref 11.5–15.5)
WBC: 7.4 10*3/uL (ref 4.0–10.5)
nRBC: 0 % (ref 0.0–0.2)

## 2018-08-31 LAB — CMP (CANCER CENTER ONLY)
ALBUMIN: 3.9 g/dL (ref 3.5–5.0)
ALK PHOS: 122 U/L (ref 38–126)
ALT: 19 U/L (ref 0–44)
ANION GAP: 11 (ref 5–15)
AST: 20 U/L (ref 15–41)
BILIRUBIN TOTAL: 0.9 mg/dL (ref 0.3–1.2)
BUN: 15 mg/dL (ref 8–23)
CALCIUM: 9.3 mg/dL (ref 8.9–10.3)
CO2: 25 mmol/L (ref 22–32)
Chloride: 105 mmol/L (ref 98–111)
Creatinine: 1.34 mg/dL — ABNORMAL HIGH (ref 0.61–1.24)
GFR, Est AFR Am: >60 mL/min (ref >60)
GFR, Estimated: 53 mL/min — ABNORMAL LOW (ref >60)
GLUCOSE: 112 mg/dL — AB (ref 70–99)
Potassium: 4.4 mmol/L (ref 3.5–5.1)
Sodium: 141 mmol/L (ref 135–145)
TOTAL PROTEIN: 7.4 g/dL (ref 6.5–8.1)

## 2018-08-31 MED ORDER — SODIUM CHLORIDE (PF) 0.9 % IJ SOLN
INTRAMUSCULAR | Status: AC
  Filled 2018-08-31: qty 50

## 2018-08-31 MED ORDER — IOHEXOL 300 MG/ML  SOLN
100.0000 mL | Freq: Once | INTRAMUSCULAR | Status: AC | PRN
  Administered 2018-08-31: 100 mL via INTRAVENOUS

## 2018-09-01 ENCOUNTER — Ambulatory Visit (HOSPITAL_COMMUNITY): Payer: Medicare HMO

## 2018-09-02 IMAGING — DX DG CHEST 1V PORT
1 series · 1 of 1 positions shown · non-contrast
Comparison: [id number] chest CT.  01/15/2018 chest x-ray.

CLINICAL DATA: 67-year-old male respiratory failure and shortness
breath. Subsequent encounter.

EXAM:
PORTABLE CHEST 1 VIEW

[chest ap]
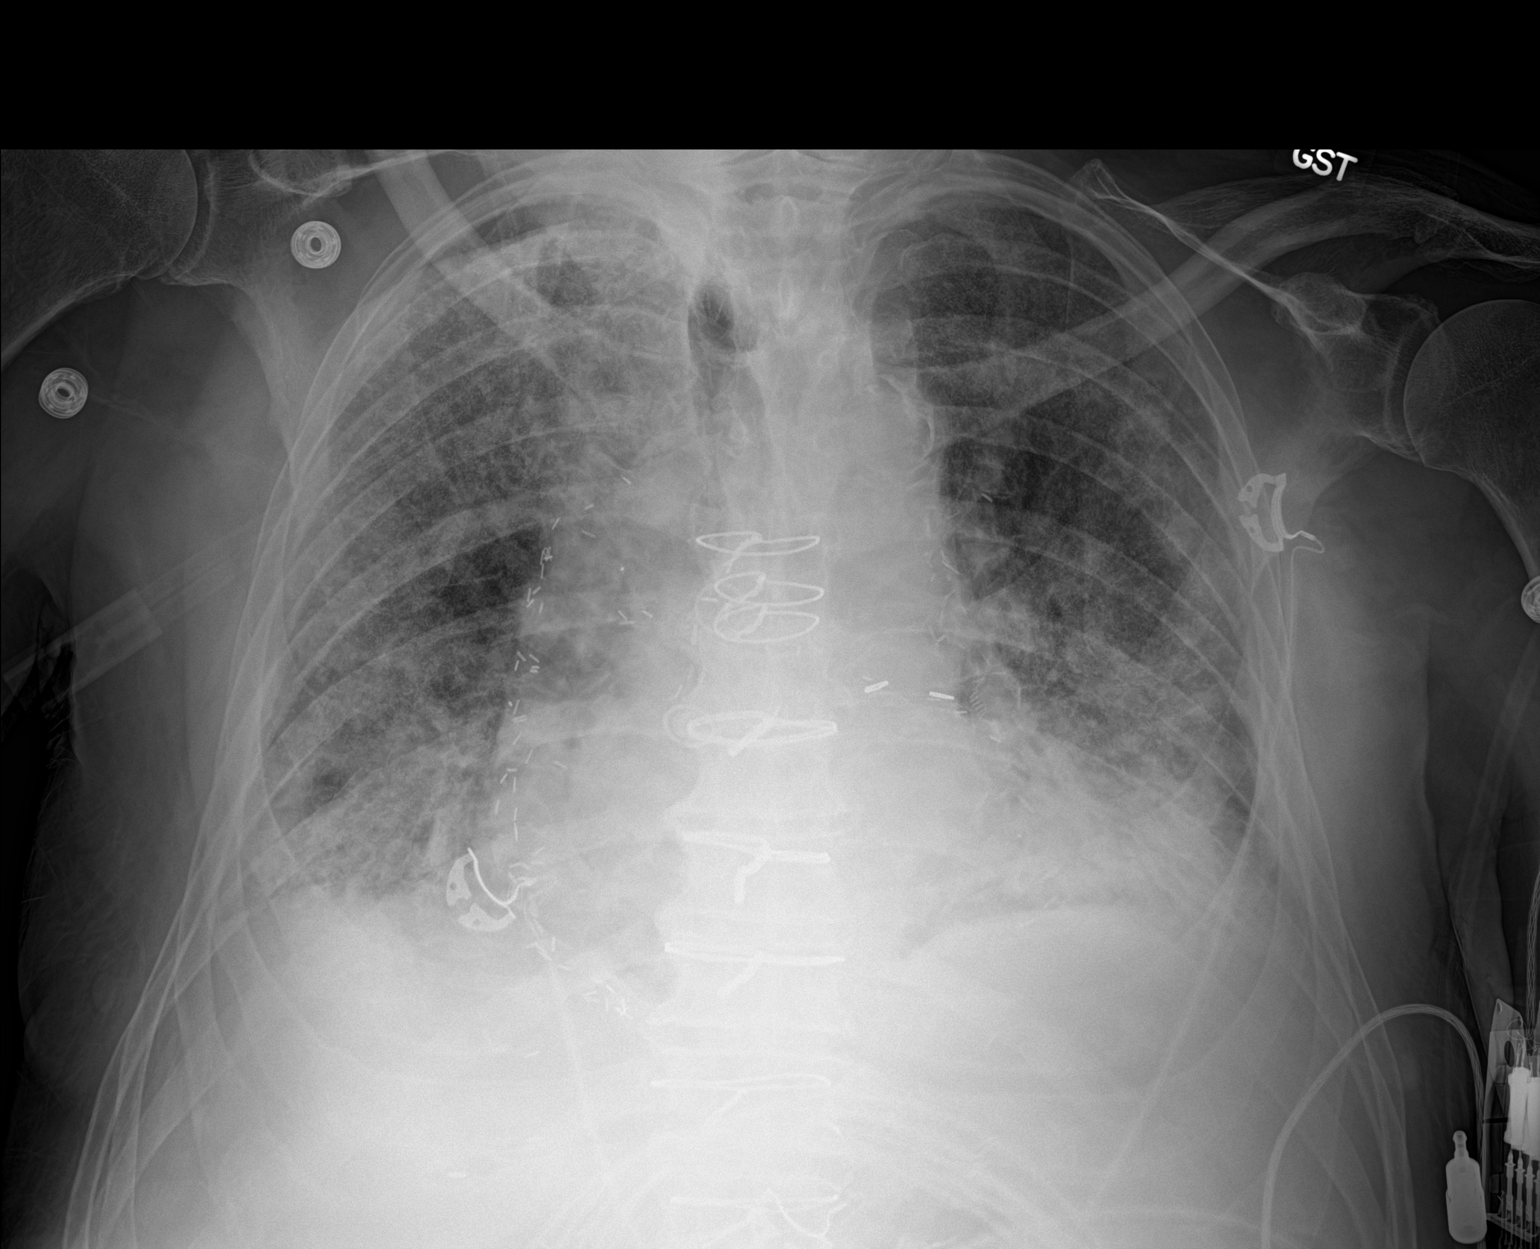

[1 of 1 positions shown; findings below may reference images not displayed]

FINDINGS: Post CABG.  Cardiomegaly.  Calcified aorta.

Slight improved aeration right lung base. Persistent pulmonary edema
and left lower lobe consolidation which may represent superimposed
left lower lobe infiltrate. No obvious pneumothorax. No acute
osseous abnormality noted.
IMPRESSION: 1. Slight improved aeration right lung base.
2. Persistent pulmonary edema and left lower lobe
consolidation/infiltrate.
3. Cardiomegaly post CABG
4.  Aortic Atherosclerosis ([id number]).

## 2018-09-05 DIAGNOSIS — E113312 Type 2 diabetes mellitus with moderate nonproliferative diabetic retinopathy with macular edema, left eye: Secondary | ICD-10-CM | POA: Diagnosis not present

## 2018-09-06 ENCOUNTER — Other Ambulatory Visit: Payer: Self-pay

## 2018-09-06 ENCOUNTER — Ambulatory Visit: Payer: Medicare HMO | Admitting: Nurse Practitioner

## 2018-09-06 ENCOUNTER — Inpatient Hospital Stay (HOSPITAL_BASED_OUTPATIENT_CLINIC_OR_DEPARTMENT_OTHER): Payer: Medicare HMO | Admitting: Oncology

## 2018-09-06 ENCOUNTER — Telehealth: Payer: Self-pay | Admitting: Oncology

## 2018-09-06 DIAGNOSIS — Y842 Radiological procedure and radiotherapy as the cause of abnormal reaction of the patient, or of later complication, without mention of misadventure at the time of the procedure: Secondary | ICD-10-CM

## 2018-09-06 DIAGNOSIS — Z923 Personal history of irradiation: Secondary | ICD-10-CM | POA: Diagnosis not present

## 2018-09-06 DIAGNOSIS — R634 Abnormal weight loss: Secondary | ICD-10-CM | POA: Diagnosis not present

## 2018-09-06 DIAGNOSIS — Z8501 Personal history of malignant neoplasm of esophagus: Secondary | ICD-10-CM

## 2018-09-06 DIAGNOSIS — Z794 Long term (current) use of insulin: Secondary | ICD-10-CM

## 2018-09-06 DIAGNOSIS — C159 Malignant neoplasm of esophagus, unspecified: Secondary | ICD-10-CM

## 2018-09-06 DIAGNOSIS — Z7901 Long term (current) use of anticoagulants: Secondary | ICD-10-CM

## 2018-09-06 DIAGNOSIS — Z79899 Other long term (current) drug therapy: Secondary | ICD-10-CM

## 2018-09-06 DIAGNOSIS — Z9221 Personal history of antineoplastic chemotherapy: Secondary | ICD-10-CM

## 2018-09-06 DIAGNOSIS — R42 Dizziness and giddiness: Secondary | ICD-10-CM

## 2018-09-06 NOTE — Telephone Encounter (Signed)
Scheduled appt per 3/24 los. ° °Printed calendar and avs. °

## 2018-09-06 NOTE — Progress Notes (Signed)
Hematology and Oncology Follow Up Visit  Justin Blackburn [id number] Sep 09, 1950 68 y.o. 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  He presented with T3N0 disease and GI bleeding.   Prior Therapy: He is status post endoscopy and a biopsy in March 2019.  He is s/p  EUS for staging purposes on 09/29/2017 which resulted in T3N0 staging.    Radiation and weekly carboplatin/paclitaxel.  Week 1 of chemotherapy to start on 10/28/2017.  He completed therapy on December 11, 2017.  Current therapy: Active surveillance.  Interim History: Justin Blackburn is here for a repeat evaluation.  Since the last visit, he reports no major changes in his health.  He continues to feel reasonably well and has fully recovered from his definitive treatment.  He denies any nausea, fatigue or reflux.  He had issues with weight loss and esophagitis after treatment which has completely resolved.  He does report some occasional dizziness that is postural but otherwise no other complaints.   Patient denied any alteration mental status, neuropathy, confusion.  Denies any headaches or lethargy.  Denies any night sweats, weight loss or changes in appetite.  Denied orthopnea, dyspnea on exertion or chest discomfort.  Denies shortness of breath, difficulty breathing hemoptysis or cough.  Denies any abdominal distention, nausea, early satiety or dyspepsia.  Denies any hematuria, frequency, dysuria or nocturia.  Denies any skin irritation, dryness or rash.  Denies any ecchymosis or petechiae.  Denies any lymphadenopathy or clotting.  Denies any heat or cold intolerance.  Denies any anxiety or depression.  Remaining review of system is negative.     Medications: I have reviewed the patient's current medications.  Current Outpatient Medications  Medication Sig Dispense Refill  . acetaminophen (TYLENOL) 325 MG tablet Take 2  tablets (650 mg total) by mouth every 6 (six) hours as needed for mild pain or headache.    [name] amiodarone (PACERONE) 200 MG tablet Take 1 tablet (200 mg total) by mouth daily. 90 tablet 3  . apixaban (ELIQUIS) 5 MG TABS tablet Take 5 mg by mouth 2 (two) times daily.    [name] atorvastatin (LIPITOR) 40 MG tablet Take 40 mg by mouth daily at 6 PM.   3  . ferrous sulfate 325 (65 FE) MG tablet Take 325 mg by mouth daily.  3  . furosemide (LASIX) 40 MG tablet Take 1 tablet (40 mg total) by mouth daily. 90 tablet 3  . gabapentin (NEURONTIN) 300 MG capsule Take 600 mg by mouth 2 (two) times daily.   1  . insulin degludec (TRESIBA FLEXTOUCH) 100 UNIT/ML SOPN FlexTouch Pen Inject 60 Units into the skin at bedtime.     [name] losartan (COZAAR) 100 MG tablet Take 50 mg by mouth daily.    . metFORMIN (GLUCOPHAGE) 1000 MG tablet Take 1,000 mg by mouth 2 (two) times daily with a meal.    . nitroGLYCERIN (NITROSTAT) 0.4 MG SL tablet PLACE 1 TABLET UNDER THE TONGUE EVERY 5 (FIVE) MINUTES AS NEEDED FOR CHEST PAIN (ANGINA). 25 tablet 0  . pantoprazole (PROTONIX) 40 MG tablet Take 1 tablet (40 mg total) by mouth daily. 30 tablet 11  . potassium chloride SA (K-DUR,KLOR-CON) 20 MEQ tablet Take 1 tablet (20 mEq total) by mouth daily. 90 tablet 3   No current facility-administered medications for this visit.      Allergies: No Known Allergies  Past Medical History, Surgical history,  Social history, and Family History were reviewed and updated.    Physical Exam:  Blood pressure (!) 146/84, pulse 94, temperature 98.5 F (36.9 C), temperature source Oral, resp. rate 17, height 6' (1.829 m), weight 114 lb 3.2 oz (51.8 kg), SpO2 100 %.    ECOG: 1    General appearance: Alert, awake without any distress. Head: Atraumatic without abnormalities Oropharynx: Without any thrush or ulcers. Eyes: No scleral icterus. Lymph nodes: No lymphadenopathy noted in the cervical, supraclavicular, or axillary nodes Heart:regular rate  and rhythm, without any murmurs or gallops.   Lung: Clear to auscultation without any rhonchi, wheezes or dullness to percussion. Abdomin: Soft, nontender without any shifting dullness or ascites. Musculoskeletal: No clubbing or cyanosis. Neurological: No motor or sensory deficits. Skin: No rashes or lesions. Psychiatric: Mood and affect appeared normal.       Lab Results: Lab Results  Component Value Date   WBC 7.4 08/31/2018   HGB 13.1 08/31/2018   HCT 41.2 08/31/2018   MCV 86.6 08/31/2018   PLT 236 08/31/2018     Chemistry      Component Value Date/Time   NA 141 08/31/2018 1123   NA 138 04/19/2018 0839   K 4.4 08/31/2018 1123   CL 105 08/31/2018 1123   CO2 25 08/31/2018 1123   BUN 15 08/31/2018 1123   BUN 13 04/19/2018 0839   CREATININE 1.34 (H) 08/31/2018 1123      Component Value Date/Time   CALCIUM 9.3 08/31/2018 1123   ALKPHOS 122 08/31/2018 1123   AST 20 08/31/2018 1123   ALT 19 08/31/2018 1123   BILITOT 0.9 08/31/2018 1123      EXAM: CT ABDOMEN AND PELVIS WITH CONTRAST  TECHNIQUE: Multidetector CT imaging of the abdomen and pelvis was performed using the standard protocol following bolus administration of intravenous contrast.  CONTRAST:  124mL OMNIPAQUE IOHEXOL 300 MG/ML  SOLN  COMPARISON:  01/06/2018  FINDINGS: Lower chest: Scarring/radiation changes in the medial lower lobes.  Hepatobiliary: Liver is within normal limits.  Layering gallstones (series 2/image 31), without associated inflammatory changes.  Pancreas: Within normal limits.  Spleen: Within normal limits.  Adrenals/Urinary Tract: Adrenal glands are within normal limits.  Kidneys are within normal limits.  No hydronephrosis.  Bladder is within normal limits.  Stomach/Bowel: Stable appearance of the distal esophagus/GE junction with calcifications, unchanged. No evidence of recurrent soft tissue mass.  Stomach is otherwise within normal limits.  No  evidence of bowel obstruction.  Normal appendix (series 2/image 83).  Vascular/Lymphatic: No evidence of abdominal aortic aneurysm.  Atherosclerotic calcifications of the abdominal aorta and branch vessels.  No suspicious abdominopelvic lymphadenopathy.  Reproductive: Prostate is unremarkable.  Other: No abdominopelvic ascites.  Small fat containing periumbilical hernia.  Small fat containing right inguinal hernia.  Musculoskeletal: Degenerative changes of the visualized thoracolumbar spine.  Median sternotomy, incompletely visualized  IMPRESSION: Stable appearance of the distal esophagus/GE junction with calcifications.  Radiation changes in the bilateral lower lungs.  No evidence of recurrent or metastatic disease.     Impression and Plan:  68 year old man with:  1.  GE junction tumor diagnosed in March 2019 after presenting with T3N0 adenocarcinoma.   He is status post radiation therapy concomitantly with chemotherapy concluded in June 2019.  He remains on active surveillance at this time without any symptoms to suggest recurrent disease.  He was not a candidate for surgical resection.  CT scan obtained on 08/31/2018 was personally reviewed and showed no evidence of recurrent disease.  The natural course of this disease and risk of relapse was assessed again and I recommended repeat imaging studies in 6 months.  2.  Iron deficiency anemia: Resolved at this time.  Globin is back within normal range.  3.  Dysphasia and weight loss: No issues reported and completely recovered at this time.   4.  Follow-up: We will be in 4 months for repeat imaging studies.  15   minutes was spent with the patient face-to-face today.  More than 50% of time was spent on reviewing imaging studies, laboratory data and discussing his disease status and future monitoring strategy.   Zola Button, MD 3/24/20209:59 AM

## 2018-09-13 ENCOUNTER — Telehealth: Payer: Self-pay | Admitting: Nurse Practitioner

## 2018-09-13 NOTE — Telephone Encounter (Signed)
Noted recent OV with Dr. Tamala Julian and concerns with CHF/orthostasis and angina.  Appointment moved to Dr. Tamala Julian for 4/6 with labs instead of trying to obtain virtually.  Patient notified.   Burtis Junes, RN, [facility] [address] [place], [place]  [id number] [phone]

## 2018-09-15 ENCOUNTER — Telehealth: Payer: Self-pay | Admitting: Interventional Cardiology

## 2018-09-15 DIAGNOSIS — I5042 Chronic combined systolic (congestive) and diastolic (congestive) heart failure: Secondary | ICD-10-CM

## 2018-09-15 NOTE — Telephone Encounter (Signed)
Increase furosemide back to 60 mg daily.  I gave him those instructions today.  Please update the medication list.  Move office visit to June.  10 to 14 days after increasing the furosemide dose please do a basic metabolic panel and BNP

## 2018-09-16 MED ORDER — FUROSEMIDE 40 MG PO TABS: 60.0000 mg | ORAL_TABLET | Freq: Every day | ORAL | 3 refills | Status: DC

## 2018-09-16 NOTE — Telephone Encounter (Signed)
Spoke with pt and scheduled him for labs on 09/27/18 and f/u visit on 6/3.  Pt verbalized understanding and was in agreement with this plan.

## 2018-09-19 ENCOUNTER — Ambulatory Visit: Payer: Medicare HMO | Admitting: Interventional Cardiology

## 2018-09-20 ENCOUNTER — Ambulatory Visit: Payer: Medicare HMO | Admitting: Nurse Practitioner

## 2018-09-27 ENCOUNTER — Other Ambulatory Visit: Payer: Medicare HMO

## 2018-09-27 ENCOUNTER — Other Ambulatory Visit: Payer: Self-pay

## 2018-09-27 DIAGNOSIS — I5042 Chronic combined systolic (congestive) and diastolic (congestive) heart failure: Secondary | ICD-10-CM

## 2018-09-27 LAB — BASIC METABOLIC PANEL
BUN/Creatinine Ratio: 16 (ref 10–24)
BUN: 21 mg/dL (ref 8–27)
CO2: 21 mmol/L (ref 20–29)
Calcium: 8.7 mg/dL (ref 8.6–10.2)
Chloride: 104 mmol/L (ref 96–106)
Creatinine, Ser: 1.32 mg/dL — ABNORMAL HIGH (ref 0.76–1.27)
GFR calc Af Amer: 63 mL/min/{1.73_m2} (ref >59)
GFR calc non Af Amer: 54 mL/min/{1.73_m2} — ABNORMAL LOW (ref >59)
Glucose: 129 mg/dL — ABNORMAL HIGH (ref 65–99)
Potassium: 4.8 mmol/L (ref 3.5–5.2)
Sodium: 142 mmol/L (ref 134–144)

## 2018-10-01 ENCOUNTER — Other Ambulatory Visit: Payer: Self-pay | Admitting: Cardiology

## 2018-10-24 DIAGNOSIS — I5042 Chronic combined systolic (congestive) and diastolic (congestive) heart failure: Secondary | ICD-10-CM | POA: Diagnosis not present

## 2018-10-24 DIAGNOSIS — I25119 Atherosclerotic heart disease of native coronary artery with unspecified angina pectoris: Secondary | ICD-10-CM | POA: Diagnosis not present

## 2018-10-24 DIAGNOSIS — Z1331 Encounter for screening for depression: Secondary | ICD-10-CM | POA: Diagnosis not present

## 2018-10-24 DIAGNOSIS — C160 Malignant neoplasm of cardia: Secondary | ICD-10-CM | POA: Diagnosis not present

## 2018-10-24 DIAGNOSIS — Z6829 Body mass index (BMI) 29.0-29.9, adult: Secondary | ICD-10-CM | POA: Diagnosis not present

## 2018-10-24 DIAGNOSIS — I480 Paroxysmal atrial fibrillation: Secondary | ICD-10-CM | POA: Diagnosis not present

## 2018-10-24 DIAGNOSIS — I10 Essential (primary) hypertension: Secondary | ICD-10-CM | POA: Diagnosis not present

## 2018-10-24 DIAGNOSIS — E1140 Type 2 diabetes mellitus with diabetic neuropathy, unspecified: Secondary | ICD-10-CM | POA: Diagnosis not present

## 2018-11-03 ENCOUNTER — Telehealth: Payer: Self-pay

## 2018-11-03 NOTE — Telephone Encounter (Signed)
YOUR CARDIOLOGY TEAM HAS ARRANGED FOR AN E-VISIT FOR YOUR APPOINTMENT - PLEASE REVIEW IMPORTANT INFORMATION BELOW SEVERAL DAYS PRIOR TO YOUR APPOINTMENT  Due to the recent COVID-19 pandemic, we are transitioning in-person office visits to tele-medicine visits in an effort to decrease unnecessary exposure to our patients, their families, and staff. These visits are billed to your insurance just like a normal visit is. We also encourage you to sign up for MyChart if you have not already done so. You will need a smartphone if possible. For patients that do not have this, we can still complete the visit using a regular telephone but do prefer a smartphone to enable video when possible. You may have a family member that lives with you that can help. If possible, we also ask that you have a blood pressure cuff and scale at home to measure your blood pressure, heart rate and weight prior to your scheduled appointment. Patients with clinical needs that need an in-person evaluation and testing will still be able to come to the office if absolutely necessary. If you have any questions, feel free to call our office.     YOUR PROVIDER WILL BE USING THE FOLLOWING PLATFORM TO COMPLETE YOUR VISIT: Phone Call  . IF USING MYCHART - How to Download the MyChart App to Your SmartPhone   - If Apple, go to [employer] and type in MyChart in the search bar and download the app. If Android, ask patient to go to [employer] and type in [place] in the search bar and download the app. The app is free but as with any other app downloads, your phone may require you to verify saved payment information or Apple/Android password.  - You will need to then log into the app with your MyChart username and password, and select [facility] as your healthcare provider to link the account.  - When it is time for your visit, go to the MyChart app, find appointments, and click Begin Video Visit. Be sure to Select Allow for your device to  access the Microphone and Camera for your visit. You will then be connected, and your provider will be with you shortly.  **If you have any issues connecting or need assistance, please contact MyChart service desk [phone])**  **If using a computer, in order to ensure the best quality for your visit, you will need to use either of the following Internet Browsers: Insurance underwriter or [employer]**  . IF USING DOXIMITY or DOXY.ME - The staff will give you instructions on receiving your link to join the meeting the day of your visit.      2-3 DAYS BEFORE YOUR APPOINTMENT  You will receive a telephone call from one of our [place] team members - your caller ID may say "Unknown caller." If this is a video visit, we will walk you through how to get the video launched on your phone. We will remind you check your blood pressure, heart rate and weight prior to your scheduled appointment. If you have an Apple Watch or Kardia, please upload any pertinent ECG strips the day before or morning of your appointment to [facility]. Our staff will also make sure you have reviewed the consent and agree to move forward with your scheduled tele-health visit.     THE DAY OF YOUR APPOINTMENT  Approximately 15 minutes prior to your scheduled appointment, you will receive a telephone call from one of [facility] team - your caller ID may say "Unknown caller."  Our staff will confirm medications, vital signs for the day and any symptoms you may be experiencing. Please have this information available prior to the time of visit start. It may also be helpful for you to have a pad of paper and pen handy for any instructions given during your visit. They will also walk you through joining the smartphone meeting if this is a video visit.    CONSENT FOR TELE-HEALTH VISIT - PLEASE REVIEW  I hereby voluntarily request, consent and authorize [facility] and its employed or contracted physicians, physician  assistants, nurse practitioners or other licensed health care professionals (the Practitioner), to provide me with telemedicine health care services (the "Services") as deemed necessary by the treating Practitioner. I acknowledge and consent to receive the Services by the Practitioner via telemedicine. I understand that the telemedicine visit will involve communicating with the Practitioner through live audiovisual communication technology and the disclosure of certain medical information by electronic transmission. I acknowledge that I have been given the opportunity to request an in-person assessment or other available alternative prior to the telemedicine visit and am voluntarily participating in the telemedicine visit.  I understand that I have the right to withhold or withdraw my consent to the use of telemedicine in the course of my care at any time, without affecting my right to future care or treatment, and that the Practitioner or I may terminate the telemedicine visit at any time. I understand that I have the right to inspect all information obtained and/or recorded in the course of the telemedicine visit and may receive copies of available information for a reasonable fee.  I understand that some of the potential risks of receiving the Services via telemedicine include:  . Delay or interruption in medical evaluation due to technological equipment failure or disruption; . Information transmitted may not be sufficient (e.g. poor resolution of images) to allow for appropriate medical decision making by the Practitioner; and/or  . In rare instances, security protocols could fail, causing a breach of personal health information.  Furthermore, I acknowledge that it is my responsibility to provide information about my medical history, conditions and care that is complete and accurate to the best of my ability. I acknowledge that Practitioner's advice, recommendations, and/or decision may be based on  factors not within their control, such as incomplete or inaccurate data provided by me or distortions of diagnostic images or specimens that may result from electronic transmissions. I understand that the practice of medicine is not an exact science and that Practitioner makes no warranties or guarantees regarding treatment outcomes. I acknowledge that I will receive a copy of this consent concurrently upon execution via email to the email address I last provided but may also request a printed copy by calling the office of [facility].    I understand that my insurance will be billed for this visit.   I have read or had this consent read to me. . I understand the contents of this consent, which adequately explains the benefits and risks of the Services being provided via telemedicine.  . I have been provided ample opportunity to ask questions regarding this consent and the Services and have had my questions answered to my satisfaction. . I give my informed consent for the services to be provided through the use of telemedicine in my medical care  By participating in this telemedicine visit I agree to the above.  

## 2018-11-14 ENCOUNTER — Ambulatory Visit (HOSPITAL_COMMUNITY): Payer: Medicare HMO

## 2018-11-15 NOTE — Progress Notes (Signed)
Virtual Visit via Video Note   This visit type was conducted due to national recommendations for restrictions regarding the COVID-19 Pandemic (e.g. social distancing) in an effort to limit this patient's exposure and mitigate transmission in our community.  Due to his co-morbid illnesses, this patient is at least at moderate risk for complications without adequate follow up.  This format is felt to be most appropriate for this patient at this time.  All issues noted in this document were discussed and addressed.  A limited physical exam was performed with this format.  Please refer to the patient's chart for his consent to telehealth for [facility].   Date:  11/15/2018   ID:  Justin Kida., DOB [birth year], MRN [id number]  Patient Location: Home Provider Location: Home  PCP:  [name], PA-C  Cardiologist:  [name], MD  Electrophysiologist:  None   Evaluation Performed:  Follow-Up Visit  Chief Complaint:  CAD/CHF/PAF   Justin Hamed. is a 68 y.o. male with with a hx of CAD, CABG x2, recent left main and circumflex DES, upper GI bleed related to previously undiagnosed esophageal cancer, chemotherapy, paroxysmal atrial fibrillation, anticoagulation therapy, acute on chronic systolic and diastolic heart failure related to atrial fibrillation, returning after recent admission for GI bleed.  Justin Blackburn is doing well.  He has chronic shortness of breath although improving compared to 3 to 6 months ago.  There is been no recurrent blood in his stool or urine.  Anginal episodes have been infrequent.  He has not felt tachycardia or extreme fatigue as he has when atrial fibrillation is occurred.  He is tolerating amiodarone without obvious side effects.  He is definitely more physically active now than he was 2 months ago.  Overall he is pleased with his current clinical status.  The patient does not have symptoms concerning for COVID-19 infection (fever, chills, cough, or new  shortness of breath).    Past Medical History:  Diagnosis Date  . A-fib ([place])    with RVR  . Acute kidney injury ([place])   . Anemia   . Angina   . Arthritis    "hands" (08/10/2017)  . Atrial flutter, paroxysmal ([place])    arrived with aflutter with RVR, EF down to 25-30%, s/p TEE DCCV on 01/29/2015  . Cancer ([place])   . CHF (congestive heart failure) ([place])   . Chronic lower back pain    "all the time from my sciatic nerve"  . Coronary artery disease   . DVT (deep venous thrombosis) ([place])   . Dyspnea   . Erectile dysfunction   . Heart failure ([place])   . Hypercholesteremia   . Hypertension   . 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 Myocardial infarction ([place]) 1988; ~3220;  ~1996;~ 2000  . Obesity   . Peripheral vascular disease ([place])   . Umbilical hernia    unrepaired   Past Surgical History:  Procedure Laterality Date  . CARDIOVERSION N/A 01/29/2015   Procedure: CARDIOVERSION;  Surgeon: [name], MD;  Location: [place];  Service: Cardiovascular;  Laterality: N/A;  . CARDIOVERSION N/A 04/26/2018   Procedure: CARDIOVERSION;  Surgeon: Josue Hector, MD;  Location: [facility] ENDOSCOPY;  Service: Cardiovascular;  Laterality: N/A;  . CORONARY ANGIOPLASTY WITH STENT PLACEMENT  08/31/11   "2 today; makes total of 3 stents"  . CORONARY ANGIOPLASTY WITH STENT PLACEMENT  08/10/2017   "2 today; makes a total of 5 stents" (08/10/2017)  .  CORONARY ARTERY BYPASS GRAFT  1998; 2007   CABG X 4; CABG X4  . CORONARY STENT INTERVENTION N/A 08/10/2017   Procedure: CORONARY STENT INTERVENTION;  Surgeon: Sherren Mocha, MD;  Location: [place] CV LAB;  Service: Cardiovascular;  Laterality: N/A;  . ESOPHAGOGASTRODUODENOSCOPY (EGD) WITH PROPOFOL N/A 09/01/2017   Procedure: ESOPHAGOGASTRODUODENOSCOPY (EGD) WITH PROPOFOL;  Surgeon: Laurence Spates, MD;  Location: [place];  Service: Endoscopy;  Laterality: N/A;  . EUS N/A 09/29/2017   Procedure: UPPER ENDOSCOPIC ULTRASOUND  (EUS) LINEAR;  Surgeon: Arta Silence, MD;  [facility]: [facility] ENDOSCOPY;  Service: Endoscopy;  Laterality: N/A;  . LEFT HEART CATH AND CORS/GRAFTS ANGIOGRAPHY N/A 08/10/2017   Procedure: LEFT HEART CATH AND CORS/GRAFTS ANGIOGRAPHY;  Surgeon: Sherren Mocha, MD;  Location: [place] CV LAB;  Service: Cardiovascular;  Laterality: N/A;  . LEFT HEART CATHETERIZATION WITH CORONARY/GRAFT ANGIOGRAM N/A 08/31/2011   Procedure: LEFT HEART CATHETERIZATION WITH Beatrix Fetters;  Surgeon: [name], MD;  [facility] C[facility] LAB;  Service: Cardiovascular;  Laterality: N/A;  . PERIPHERAL VASCULAR CATHETERIZATION N/A 04/27/2016   Procedure: Abdominal Aortogram w/Lower Extremity;  Surgeon: Angelia Mould, MD;  Location: [place] CV LAB;  Service: Cardiovascular;  Laterality: N/A;  . TEE WITHOUT CARDIOVERSION N/A 01/29/2015   Procedure: TRANSESOPHAGEAL ECHOCARDIOGRAM (TEE);  Surgeon: [name], MD;  Location[facility] ENDOSCOPY;  Service: Cardiovascular;  Laterality: N/A;     No outpatient medications have been marked as taking for the 11/16/18 encounter (Appointment) with Belva Crome, MD.     Allergies:   Patient has no known allergies.   Social History   Tobacco Use  . Smoking status: Former Smoker    Packs/day: 2.00    Years: 6.00    Pack years: 12.00    Types: Cigarettes    Last attempt to quit: 06/16/1971    Years since quitting: 47.4  . Smokeless tobacco: Never Used  . Tobacco comment: "chewed on cigars; never really did chew tobacco"  Substance Use Topics  . Alcohol use: No  . Drug use: No     Family Hx: The patient's family history includes Diabetes in his father and mother; Heart attack in his brother, father, and mother; Heart disease in his father and mother. There is no history of Stroke.  ROS:   Please see the history of present illness.    Arthritis in his back and knees limit physical activity.  Also has neuropathy in his feet which make ambulation somewhat  riskier for the possibility of falls. All other systems reviewed and are negative.   Prior CV studies:   The following studies were reviewed today:  No recent or new imaging data  Labs/Other Tests and Data Reviewed:    EKG:  No ECG reviewed.  Recent Labs: 01/14/2018: B Natriuretic Peptide 781.3 01/19/2018: TSH 0.111 01/23/2018: Magnesium 1.9 03/03/2018: NT-Pro BNP 1,970 08/31/2018: ALT 19; Hemoglobin 13.1; Platelet Count 236 09/27/2018: BUN 21; Creatinine, Ser 1.32; Potassium 4.8; Sodium 142   Recent Lipid Panel Lab Results  Component Value Date/Time   CHOL 149 12/07/2017 03:36 AM   CHOL 129 09/21/2017 01:52 PM   TRIG 98 12/07/2017 03:36 AM   HDL 33 (L) 12/07/2017 03:36 AM   HDL 31 (L) 09/21/2017 01:52 PM   CHOLHDL 4.5 12/07/2017 03:36 AM   LDLCALC 96 12/07/2017 03:36 AM   LDLCALC 70 09/21/2017 01:52 PM    Wt Readings from Last 3 Encounters:  09/06/18 114 lb 3.2 oz (51.8 kg)  08/09/18 210 lb 12.8 oz (  95.6 kg)  05/10/18 208 lb 9.6 oz (94.6 kg)     Objective:    Vital Signs:  There were no vitals taken for this visit.   VITAL SIGNS:  reviewed  ASSESSMENT & PLAN:    1. Chronic combined systolic and diastolic heart failure, NYHA class 2 ([place])   2. Coronary artery disease involving coronary bypass graft of native heart without angina pectoris   3. Atrial flutter, unspecified type ([place])   4. Type 2 diabetes mellitus with other circulatory complication, with long-term current use of insulin ([place])   5. Peripheral vascular disease ([place])   6. Essential hypertension   7. H/O amiodarone therapy   8. Anticoagulated    PLAN:  1. Justin Blackburn seems to be doing well.  He has chronic baseline shortness of breath.  Left lower extremity remains swollen but is improved compared to the last office visit. 2. Not using nitroglycerin on a regular basis.  Knows how to ambulate and be physically active without inciting angina pectoris. 3. No recent complaints of palpitations or extreme fatigue as  he has had in the past when atrial arrhythmias occur. 4. Being followed by his primary care physician.  Obviously goal A1c is less than 7. 5. No complaint 6. Target blood pressure 130/80 mmHg or less. 7. TSH and liver panel will be done at office visit in 3 months.  COVID-19 Education: The signs and symptoms of COVID-19 were discussed with the patient and how to seek care for testing (follow up with PCP or arrange E-visit).  The importance of social distancing was discussed today.  Time:   Today, I have spent 15 minutes with the patient with telehealth technology discussing the above problems.     Medication Adjustments/Labs and Tests Ordered: Current medicines are reviewed at length with the patient today.  Concerns regarding medicines are outlined above.   Tests Ordered: No orders of the defined types were placed in this encounter.   Medication Changes: No orders of the defined types were placed in this encounter.   Disposition:  Follow up in 3 month(s)  Signed, [name], MD  11/15/2018 8:05 PM    [place]

## 2018-11-16 ENCOUNTER — Other Ambulatory Visit: Payer: Self-pay

## 2018-11-16 ENCOUNTER — Encounter: Payer: Self-pay | Admitting: Interventional Cardiology

## 2018-11-16 ENCOUNTER — Telehealth (INDEPENDENT_AMBULATORY_CARE_PROVIDER_SITE_OTHER): Payer: Medicare HMO | Admitting: Interventional Cardiology

## 2018-11-16 VITALS — BP 120/58 | HR 43 | Ht 72.0 in | Wt 211.2 lb

## 2018-11-16 DIAGNOSIS — I5042 Chronic combined systolic (congestive) and diastolic (congestive) heart failure: Secondary | ICD-10-CM | POA: Diagnosis not present

## 2018-11-16 DIAGNOSIS — I4892 Unspecified atrial flutter: Secondary | ICD-10-CM

## 2018-11-16 DIAGNOSIS — Z9229 Personal history of other drug therapy: Secondary | ICD-10-CM

## 2018-11-16 DIAGNOSIS — Z7901 Long term (current) use of anticoagulants: Secondary | ICD-10-CM

## 2018-11-16 DIAGNOSIS — Z794 Long term (current) use of insulin: Secondary | ICD-10-CM

## 2018-11-16 DIAGNOSIS — I739 Peripheral vascular disease, unspecified: Secondary | ICD-10-CM

## 2018-11-16 DIAGNOSIS — I10 Essential (primary) hypertension: Secondary | ICD-10-CM

## 2018-11-16 DIAGNOSIS — E1159 Type 2 diabetes mellitus with other circulatory complications: Secondary | ICD-10-CM

## 2018-11-16 DIAGNOSIS — I25810 Atherosclerosis of coronary artery bypass graft(s) without angina pectoris: Secondary | ICD-10-CM

## 2018-11-16 NOTE — Patient Instructions (Signed)
Medication Instructions:  Your physician recommends that you continue on your current medications as directed. Please refer to the Current Medication list given to you today.  If you need a refill on your cardiac medications before your next appointment, please call your pharmacy.   Lab work: Your physician recommends that you return for lab work in: August or September.  If you have labs (blood work) drawn today and your tests are completely normal, you will receive your results only by: [name] MyChart Message (if you have MyChart) OR . A paper copy in the mail If you have any lab test that is abnormal or we need to change your treatment, we will call you to review the results.  Testing/Procedures: None  Follow-Up: At [facility], you and your health needs are our priority.  As part of our continuing mission to provide you with exceptional heart care, we have created designated Provider Care Teams.  These Care Teams include your primary Cardiologist (physician) and Advanced Practice Providers (APPs -  Physician Assistants and Nurse Practitioners) who all work together to provide you with the care you need, when you need it. You will need a follow up appointment in 2-3 months (anytime after labs are completed and can be seen face to face).  Please call our office 2 months in advance to schedule this appointment.  You may see [name], MD or one of the following Advanced Practice Providers on your designated Care Team:   Truitt Merle, NP Cecilie Kicks, NP . Kathyrn Drown, NP  Any Other Special Instructions Will Be Listed Below (If Applicable).

## 2018-11-21 DIAGNOSIS — M9901 Segmental and somatic dysfunction of cervical region: Secondary | ICD-10-CM | POA: Diagnosis not present

## 2018-11-21 DIAGNOSIS — M62838 Other muscle spasm: Secondary | ICD-10-CM | POA: Diagnosis not present

## 2018-11-21 DIAGNOSIS — M9902 Segmental and somatic dysfunction of thoracic region: Secondary | ICD-10-CM | POA: Diagnosis not present

## 2018-11-21 DIAGNOSIS — M5412 Radiculopathy, cervical region: Secondary | ICD-10-CM | POA: Diagnosis not present

## 2018-11-23 DIAGNOSIS — M9902 Segmental and somatic dysfunction of thoracic region: Secondary | ICD-10-CM | POA: Diagnosis not present

## 2018-11-23 DIAGNOSIS — M5412 Radiculopathy, cervical region: Secondary | ICD-10-CM | POA: Diagnosis not present

## 2018-11-23 DIAGNOSIS — M9901 Segmental and somatic dysfunction of cervical region: Secondary | ICD-10-CM | POA: Diagnosis not present

## 2018-11-23 DIAGNOSIS — M62838 Other muscle spasm: Secondary | ICD-10-CM | POA: Diagnosis not present

## 2018-11-25 DIAGNOSIS — M9902 Segmental and somatic dysfunction of thoracic region: Secondary | ICD-10-CM | POA: Diagnosis not present

## 2018-11-25 DIAGNOSIS — M62838 Other muscle spasm: Secondary | ICD-10-CM | POA: Diagnosis not present

## 2018-11-25 DIAGNOSIS — M5412 Radiculopathy, cervical region: Secondary | ICD-10-CM | POA: Diagnosis not present

## 2018-11-25 DIAGNOSIS — M9901 Segmental and somatic dysfunction of cervical region: Secondary | ICD-10-CM | POA: Diagnosis not present

## 2018-11-29 DIAGNOSIS — M9901 Segmental and somatic dysfunction of cervical region: Secondary | ICD-10-CM | POA: Diagnosis not present

## 2018-11-29 DIAGNOSIS — M5412 Radiculopathy, cervical region: Secondary | ICD-10-CM | POA: Diagnosis not present

## 2018-11-29 DIAGNOSIS — M62838 Other muscle spasm: Secondary | ICD-10-CM | POA: Diagnosis not present

## 2018-11-29 DIAGNOSIS — M9902 Segmental and somatic dysfunction of thoracic region: Secondary | ICD-10-CM | POA: Diagnosis not present

## 2018-12-01 DIAGNOSIS — M9902 Segmental and somatic dysfunction of thoracic region: Secondary | ICD-10-CM | POA: Diagnosis not present

## 2018-12-01 DIAGNOSIS — M9901 Segmental and somatic dysfunction of cervical region: Secondary | ICD-10-CM | POA: Diagnosis not present

## 2018-12-01 DIAGNOSIS — M5412 Radiculopathy, cervical region: Secondary | ICD-10-CM | POA: Diagnosis not present

## 2018-12-01 DIAGNOSIS — M62838 Other muscle spasm: Secondary | ICD-10-CM | POA: Diagnosis not present

## 2018-12-05 DIAGNOSIS — M9902 Segmental and somatic dysfunction of thoracic region: Secondary | ICD-10-CM | POA: Diagnosis not present

## 2018-12-05 DIAGNOSIS — M9901 Segmental and somatic dysfunction of cervical region: Secondary | ICD-10-CM | POA: Diagnosis not present

## 2018-12-05 DIAGNOSIS — M5412 Radiculopathy, cervical region: Secondary | ICD-10-CM | POA: Diagnosis not present

## 2018-12-05 DIAGNOSIS — M62838 Other muscle spasm: Secondary | ICD-10-CM | POA: Diagnosis not present

## 2018-12-08 DIAGNOSIS — E1165 Type 2 diabetes mellitus with hyperglycemia: Secondary | ICD-10-CM | POA: Diagnosis not present

## 2018-12-08 DIAGNOSIS — Z6829 Body mass index (BMI) 29.0-29.9, adult: Secondary | ICD-10-CM | POA: Diagnosis not present

## 2018-12-08 DIAGNOSIS — I10 Essential (primary) hypertension: Secondary | ICD-10-CM | POA: Diagnosis not present

## 2018-12-08 DIAGNOSIS — M9902 Segmental and somatic dysfunction of thoracic region: Secondary | ICD-10-CM | POA: Diagnosis not present

## 2018-12-08 DIAGNOSIS — M9901 Segmental and somatic dysfunction of cervical region: Secondary | ICD-10-CM | POA: Diagnosis not present

## 2018-12-08 DIAGNOSIS — M62838 Other muscle spasm: Secondary | ICD-10-CM | POA: Diagnosis not present

## 2018-12-08 DIAGNOSIS — I480 Paroxysmal atrial fibrillation: Secondary | ICD-10-CM | POA: Diagnosis not present

## 2018-12-08 DIAGNOSIS — M5412 Radiculopathy, cervical region: Secondary | ICD-10-CM | POA: Diagnosis not present

## 2018-12-08 DIAGNOSIS — I25119 Atherosclerotic heart disease of native coronary artery with unspecified angina pectoris: Secondary | ICD-10-CM | POA: Diagnosis not present

## 2018-12-08 DIAGNOSIS — Z125 Encounter for screening for malignant neoplasm of prostate: Secondary | ICD-10-CM | POA: Diagnosis not present

## 2018-12-08 DIAGNOSIS — C160 Malignant neoplasm of cardia: Secondary | ICD-10-CM | POA: Diagnosis not present

## 2018-12-08 DIAGNOSIS — E1140 Type 2 diabetes mellitus with diabetic neuropathy, unspecified: Secondary | ICD-10-CM | POA: Diagnosis not present

## 2018-12-13 DIAGNOSIS — M62838 Other muscle spasm: Secondary | ICD-10-CM | POA: Diagnosis not present

## 2018-12-13 DIAGNOSIS — M9902 Segmental and somatic dysfunction of thoracic region: Secondary | ICD-10-CM | POA: Diagnosis not present

## 2018-12-13 DIAGNOSIS — M9901 Segmental and somatic dysfunction of cervical region: Secondary | ICD-10-CM | POA: Diagnosis not present

## 2018-12-13 DIAGNOSIS — M5412 Radiculopathy, cervical region: Secondary | ICD-10-CM | POA: Diagnosis not present

## 2018-12-15 DIAGNOSIS — M62838 Other muscle spasm: Secondary | ICD-10-CM | POA: Diagnosis not present

## 2018-12-15 DIAGNOSIS — M9901 Segmental and somatic dysfunction of cervical region: Secondary | ICD-10-CM | POA: Diagnosis not present

## 2018-12-15 DIAGNOSIS — M9902 Segmental and somatic dysfunction of thoracic region: Secondary | ICD-10-CM | POA: Diagnosis not present

## 2018-12-15 DIAGNOSIS — M5412 Radiculopathy, cervical region: Secondary | ICD-10-CM | POA: Diagnosis not present

## 2018-12-20 DIAGNOSIS — M9901 Segmental and somatic dysfunction of cervical region: Secondary | ICD-10-CM | POA: Diagnosis not present

## 2018-12-20 DIAGNOSIS — M62838 Other muscle spasm: Secondary | ICD-10-CM | POA: Diagnosis not present

## 2018-12-20 DIAGNOSIS — M5412 Radiculopathy, cervical region: Secondary | ICD-10-CM | POA: Diagnosis not present

## 2018-12-20 DIAGNOSIS — M9902 Segmental and somatic dysfunction of thoracic region: Secondary | ICD-10-CM | POA: Diagnosis not present

## 2018-12-22 DIAGNOSIS — M9901 Segmental and somatic dysfunction of cervical region: Secondary | ICD-10-CM | POA: Diagnosis not present

## 2018-12-22 DIAGNOSIS — M5412 Radiculopathy, cervical region: Secondary | ICD-10-CM | POA: Diagnosis not present

## 2018-12-22 DIAGNOSIS — M9902 Segmental and somatic dysfunction of thoracic region: Secondary | ICD-10-CM | POA: Diagnosis not present

## 2018-12-22 DIAGNOSIS — M62838 Other muscle spasm: Secondary | ICD-10-CM | POA: Diagnosis not present

## 2018-12-27 DIAGNOSIS — M5412 Radiculopathy, cervical region: Secondary | ICD-10-CM | POA: Diagnosis not present

## 2018-12-27 DIAGNOSIS — M9901 Segmental and somatic dysfunction of cervical region: Secondary | ICD-10-CM | POA: Diagnosis not present

## 2018-12-27 DIAGNOSIS — M9902 Segmental and somatic dysfunction of thoracic region: Secondary | ICD-10-CM | POA: Diagnosis not present

## 2018-12-27 DIAGNOSIS — M62838 Other muscle spasm: Secondary | ICD-10-CM | POA: Diagnosis not present

## 2018-12-29 DIAGNOSIS — M9902 Segmental and somatic dysfunction of thoracic region: Secondary | ICD-10-CM | POA: Diagnosis not present

## 2018-12-29 DIAGNOSIS — M5412 Radiculopathy, cervical region: Secondary | ICD-10-CM | POA: Diagnosis not present

## 2018-12-29 DIAGNOSIS — M9901 Segmental and somatic dysfunction of cervical region: Secondary | ICD-10-CM | POA: Diagnosis not present

## 2018-12-29 DIAGNOSIS — M62838 Other muscle spasm: Secondary | ICD-10-CM | POA: Diagnosis not present

## 2019-01-03 DIAGNOSIS — M5412 Radiculopathy, cervical region: Secondary | ICD-10-CM | POA: Diagnosis not present

## 2019-01-03 DIAGNOSIS — M9901 Segmental and somatic dysfunction of cervical region: Secondary | ICD-10-CM | POA: Diagnosis not present

## 2019-01-03 DIAGNOSIS — M62838 Other muscle spasm: Secondary | ICD-10-CM | POA: Diagnosis not present

## 2019-01-03 DIAGNOSIS — M9902 Segmental and somatic dysfunction of thoracic region: Secondary | ICD-10-CM | POA: Diagnosis not present

## 2019-01-05 DIAGNOSIS — M9901 Segmental and somatic dysfunction of cervical region: Secondary | ICD-10-CM | POA: Diagnosis not present

## 2019-01-05 DIAGNOSIS — M9902 Segmental and somatic dysfunction of thoracic region: Secondary | ICD-10-CM | POA: Diagnosis not present

## 2019-01-05 DIAGNOSIS — M62838 Other muscle spasm: Secondary | ICD-10-CM | POA: Diagnosis not present

## 2019-01-05 DIAGNOSIS — M5412 Radiculopathy, cervical region: Secondary | ICD-10-CM | POA: Diagnosis not present

## 2019-01-10 DIAGNOSIS — M9901 Segmental and somatic dysfunction of cervical region: Secondary | ICD-10-CM | POA: Diagnosis not present

## 2019-01-10 DIAGNOSIS — M5412 Radiculopathy, cervical region: Secondary | ICD-10-CM | POA: Diagnosis not present

## 2019-01-10 DIAGNOSIS — M9902 Segmental and somatic dysfunction of thoracic region: Secondary | ICD-10-CM | POA: Diagnosis not present

## 2019-01-10 DIAGNOSIS — M62838 Other muscle spasm: Secondary | ICD-10-CM | POA: Diagnosis not present

## 2019-01-12 DIAGNOSIS — M5412 Radiculopathy, cervical region: Secondary | ICD-10-CM | POA: Diagnosis not present

## 2019-01-12 DIAGNOSIS — M9902 Segmental and somatic dysfunction of thoracic region: Secondary | ICD-10-CM | POA: Diagnosis not present

## 2019-01-12 DIAGNOSIS — M9901 Segmental and somatic dysfunction of cervical region: Secondary | ICD-10-CM | POA: Diagnosis not present

## 2019-01-12 DIAGNOSIS — M62838 Other muscle spasm: Secondary | ICD-10-CM | POA: Diagnosis not present

## 2019-01-17 DIAGNOSIS — M9902 Segmental and somatic dysfunction of thoracic region: Secondary | ICD-10-CM | POA: Diagnosis not present

## 2019-01-17 DIAGNOSIS — M5412 Radiculopathy, cervical region: Secondary | ICD-10-CM | POA: Diagnosis not present

## 2019-01-17 DIAGNOSIS — M62838 Other muscle spasm: Secondary | ICD-10-CM | POA: Diagnosis not present

## 2019-01-17 DIAGNOSIS — M9901 Segmental and somatic dysfunction of cervical region: Secondary | ICD-10-CM | POA: Diagnosis not present

## 2019-01-19 DIAGNOSIS — M9901 Segmental and somatic dysfunction of cervical region: Secondary | ICD-10-CM | POA: Diagnosis not present

## 2019-01-19 DIAGNOSIS — M5412 Radiculopathy, cervical region: Secondary | ICD-10-CM | POA: Diagnosis not present

## 2019-01-19 DIAGNOSIS — M9902 Segmental and somatic dysfunction of thoracic region: Secondary | ICD-10-CM | POA: Diagnosis not present

## 2019-01-19 DIAGNOSIS — M62838 Other muscle spasm: Secondary | ICD-10-CM | POA: Diagnosis not present

## 2019-01-24 ENCOUNTER — Other Ambulatory Visit: Payer: Medicare HMO

## 2019-01-24 ENCOUNTER — Encounter (INDEPENDENT_AMBULATORY_CARE_PROVIDER_SITE_OTHER): Payer: Self-pay

## 2019-01-24 ENCOUNTER — Other Ambulatory Visit: Payer: Self-pay

## 2019-01-24 DIAGNOSIS — M5412 Radiculopathy, cervical region: Secondary | ICD-10-CM | POA: Diagnosis not present

## 2019-01-24 DIAGNOSIS — Z794 Long term (current) use of insulin: Secondary | ICD-10-CM | POA: Diagnosis not present

## 2019-01-24 DIAGNOSIS — I25810 Atherosclerosis of coronary artery bypass graft(s) without angina pectoris: Secondary | ICD-10-CM | POA: Diagnosis not present

## 2019-01-24 DIAGNOSIS — Z9229 Personal history of other drug therapy: Secondary | ICD-10-CM

## 2019-01-24 DIAGNOSIS — M62838 Other muscle spasm: Secondary | ICD-10-CM | POA: Diagnosis not present

## 2019-01-24 DIAGNOSIS — E1159 Type 2 diabetes mellitus with other circulatory complications: Secondary | ICD-10-CM | POA: Diagnosis not present

## 2019-01-24 DIAGNOSIS — I5042 Chronic combined systolic (congestive) and diastolic (congestive) heart failure: Secondary | ICD-10-CM | POA: Diagnosis not present

## 2019-01-24 DIAGNOSIS — M9901 Segmental and somatic dysfunction of cervical region: Secondary | ICD-10-CM | POA: Diagnosis not present

## 2019-01-24 DIAGNOSIS — M9902 Segmental and somatic dysfunction of thoracic region: Secondary | ICD-10-CM | POA: Diagnosis not present

## 2019-01-25 LAB — BASIC METABOLIC PANEL
BUN/Creatinine Ratio: 18 (ref 10–24)
BUN: 29 mg/dL — ABNORMAL HIGH (ref 8–27)
CO2: 20 mmol/L (ref 20–29)
Calcium: 9 mg/dL (ref 8.6–10.2)
Chloride: 101 mmol/L (ref 96–106)
Creatinine, Ser: 1.62 mg/dL — ABNORMAL HIGH (ref 0.76–1.27)
GFR calc Af Amer: 49 mL/min/{1.73_m2} — ABNORMAL LOW (ref >59)
GFR calc non Af Amer: 42 mL/min/{1.73_m2} — ABNORMAL LOW (ref >59)
Glucose: 80 mg/dL (ref 65–99)
Potassium: 4.4 mmol/L (ref 3.5–5.2)
Sodium: 140 mmol/L (ref 134–144)

## 2019-01-25 LAB — HEPATIC FUNCTION PANEL
ALT: 16 IU/L (ref 0–44)
AST: 22 IU/L (ref 0–40)
Albumin: 4.2 g/dL (ref 3.8–4.8)
Alkaline Phosphatase: 100 IU/L (ref 39–117)
Bilirubin Total: 0.4 mg/dL (ref 0.0–1.2)
Bilirubin, Direct: 0.15 mg/dL (ref 0.00–0.40)
Total Protein: 6.4 g/dL (ref 6.0–8.5)

## 2019-01-25 LAB — HEMOGLOBIN A1C
Est. average glucose Bld gHb Est-mCnc: 180 mg/dL
Hgb A1c MFr Bld: 7.9 % — ABNORMAL HIGH (ref 4.8–5.6)

## 2019-01-25 LAB — TSH: TSH: 1.64 u[IU]/mL (ref 0.450–4.500)

## 2019-01-25 LAB — PRO B NATRIURETIC PEPTIDE: NT-Pro BNP: 531 pg/mL — ABNORMAL HIGH (ref 0–376)

## 2019-01-26 NOTE — Progress Notes (Signed)
Cardiology Office Note:    Date:  01/27/2019   ID:  Justin Kida., DOB April 25, 1951, MRN [id number]  PCP:  [name], PA-C  Cardiologist:  [name], MD   Referring MD: [name], PA-C   Chief Complaint  Patient presents with  . Coronary Artery Disease  . Congestive Heart Failure  . Atrial Fibrillation    History of Present Illness:    Justin Dumlao. is a 68 y.o. male with a hx of a hx of CAD, CABG x2, recent left main and circumflex DES, upper GI bleed related to previously undiagnosed esophageal cancer, chemotherapy, paroxysmal atrial fibrillation, anticoagulation therapy, acute on chronic systolic and diastolic heart failure related to atrial fibrillation, returning after recent admission for GI bleed.  Justin Blackburn is still being bothered by orthostatic dizziness.  He is compliant with his current medical regimen.  He has stable angina.  Left shoulder discomfort response to sublingual nitroglycerin and is an anginal variant.  He denies orthopnea and PND.  Lower extremity swelling has resolved.  He is compliant with his current medical regimen.  He has not had syncope.  He does not drive because of unsteadiness.  He stopped taking insulin for greater than a month for no apparent reason.  Was found to have markedly elevated blood sugars and insulin was resumed.  Past Medical History:  Diagnosis Date  . A-fib ([place])    with RVR  . Acute kidney injury ([place])   . Anemia   . Angina   . Arthritis    "hands" (08/10/2017)  . Atrial flutter, paroxysmal ([place])    arrived with aflutter with RVR, EF down to 25-30%, s/p TEE DCCV on 01/29/2015  . Cancer ([place])   . CHF (congestive heart failure) ([place])   . Chronic lower back pain    "all the time from my sciatic nerve"  . Coronary artery disease   . DVT (deep venous thrombosis) ([place])   . Dyspnea   . Erectile dysfunction   . Heart failure ([place])   . Hypercholesteremia   . Hypertension   . Insulin-requiring or dependent type  2 diabetes mellitus   . Myocardial infarction ([place]) 1988; ~2703;  ~1996;~ 2000  . Obesity   . Peripheral vascular disease ([place])   . Umbilical hernia    unrepaired    Past Surgical History:  Procedure Laterality Date  . CARDIOVERSION N/A 01/29/2015   Procedure: CARDIOVERSION;  Surgeon: [name], MD;  Location: [place];  Service: Cardiovascular;  Laterality: N/A;  . CARDIOVERSION N/A 04/26/2018   Procedure: CARDIOVERSION;  Surgeon: Josue Hector, MD;  Location: [facility] ENDOSCOPY;  Service: Cardiovascular;  Laterality: N/A;  . CORONARY ANGIOPLASTY WITH STENT PLACEMENT  08/31/11   "2 today; makes total of 3 stents"  . CORONARY ANGIOPLASTY WITH STENT PLACEMENT  08/10/2017   "2 today; makes a total of 5 stents" (08/10/2017)  . CORONARY ARTERY BYPASS GRAFT  1998; 2007   CABG X 4; CABG X4  . CORONARY STENT INTERVENTION N/A 08/10/2017   Procedure: CORONARY STENT INTERVENTION;  Surgeon: Sherren Mocha, MD;  Location: [place] CV LAB;  Service: Cardiovascular;  Laterality: N/A;  . ESOPHAGOGASTRODUODENOSCOPY (EGD) WITH PROPOFOL N/A 09/01/2017   Procedure: ESOPHAGOGASTRODUODENOSCOPY (EGD) WITH PROPOFOL;  Surgeon: Laurence Spates, MD;  [place];  Service: Endoscopy;  Laterality: N/A;  . EUS N/A 09/29/2017   Procedure: UPPER ENDOSCOPIC ULTRASOUND (EUS) LINEAR;  Surgeon: Arta Silence, MD;  [facility] ENDOSCOPY;  Service: Endoscopy;  Laterality: N/A;  .  LEFT HEART CATH AND CORS/GRAFTS ANGIOGRAPHY N/A 08/10/2017   Procedure: LEFT HEART CATH AND CORS/GRAFTS ANGIOGRAPHY;  Surgeon: Sherren Mocha, MD;  Location: [place] CV LAB;  Service: Cardiovascular;  Laterality: N/A;  . LEFT HEART CATHETERIZATION WITH CORONARY/GRAFT ANGIOGRAM N/A 08/31/2011   Procedure: LEFT HEART CATHETERIZATION WITH Beatrix Fetters;  Surgeon: [name], MD;  Location[facility] CATH LAB;  Service: Cardiovascular;  Laterality: N/A;  . PERIPHERAL VASCULAR CATHETERIZATION N/A 04/27/2016    Procedure: Abdominal Aortogram w/Lower Extremity;  Surgeon: Angelia Mould, MD;  Location: [place] CV LAB;  Service: Cardiovascular;  Laterality: N/A;  . TEE WITHOUT CARDIOVERSION N/A 01/29/2015   Procedure: TRANSESOPHAGEAL ECHOCARDIOGRAM (TEE);  Surgeon: [name], MD;  Location: [facility] ENDOSCOPY;  Service: Cardiovascular;  Laterality: N/A;    Current Medications: Current Meds  Medication Sig  . acetaminophen (TYLENOL) 325 MG tablet Take 2 tablets (650 mg total) by mouth every 6 (six) hours as needed for mild pain or headache.  [name] amiodarone (PACERONE) 200 MG tablet Take 1 tablet (200 mg total) by mouth daily.  [name] apixaban (ELIQUIS) 5 MG TABS tablet Take 5 mg by mouth 2 (two) times daily.  [name] atorvastatin (LIPITOR) 40 MG tablet Take 40 mg by mouth daily at 6 PM.   . ferrous sulfate 325 (65 FE) MG tablet Take 325 mg by mouth daily.  . furosemide (LASIX) 40 MG tablet Take 1.5 tablets (60 mg total) by mouth daily.  [name] gabapentin (NEURONTIN) 300 MG capsule Take 600 mg by mouth 2 (two) times daily.   . insulin degludec (TRESIBA FLEXTOUCH) 100 UNIT/ML SOPN FlexTouch Pen Inject 60 Units into the skin at bedtime.   [name] losartan (COZAAR) 100 MG tablet Take 50 mg by mouth daily.  . metFORMIN (GLUCOPHAGE) 1000 MG tablet Take 1,000 mg by mouth 2 (two) times daily with a meal.  . nitroGLYCERIN (NITROSTAT) 0.4 MG SL tablet PLACE 1 TABLET UNDER THE TONGUE EVERY 5 (FIVE) MINUTES AS NEEDED FOR CHEST PAIN (ANGINA).  [name] pantoprazole (PROTONIX) 40 MG tablet TAKE 1 TABLET BY MOUTH EVERY DAY  . potassium chloride SA (K-DUR,KLOR-CON) 20 MEQ tablet Take 1 tablet (20 mEq total) by mouth daily.     Allergies:   Patient has no known allergies.   Social History   Socioeconomic History  . Marital status: Married    Spouse name: Not on file  . Number of children: Not on file  . Years of education: Not on file  . Highest education level: Not on file  Occupational History  . Not on file  Social Needs  .  Financial resource strain: Not on file  . Food insecurity    Worry: Not on file    Inability: Not on file  . Transportation needs    Medical: Not on file    Non-medical: Not on file  Tobacco Use  . Smoking status: Former Smoker    Packs/day: 2.00    Years: 6.00    Pack years: 12.00    Types: Cigarettes    Quit date: 06/16/1971    Years since quitting: 47.6  . Smokeless tobacco: Never Used  . Tobacco comment: "chewed on cigars; never really did chew tobacco"  Substance and Sexual Activity  . Alcohol use: No  . Drug use: No  . Sexual activity: Never  Lifestyle  . Physical activity    Days per week: Not on file    Minutes per session: Not on file  . Stress: Not on file  Relationships  .  Social Herbalist on phone: Not on file    Gets together: Not on file    Attends religious service: Not on file    Active member of club or organization: Not on file    Attends meetings of clubs or organizations: Not on file    Relationship status: Not on file  Other Topics Concern  . Not on file  Social History Narrative  . Not on file     Family History: The patient's family history includes Diabetes in his father and mother; Heart attack in his brother, father, and mother; Heart disease in his father and mother. There is no history of Stroke.  ROS:   Please see the history of present illness.    He has imbalance when he tries to walk.  He has difficulty with vision.  Overall he feels slightly improved compared to his last visit.  All other systems reviewed and are negative.  EKGs/Labs/Other Studies Reviewed:    The following studies were reviewed today: No new imaging data  EKG:  EKG no new EKG  Recent Labs: 08/31/2018: Hemoglobin 13.1; Platelet Count 236 01/24/2019: ALT 16; BUN 29; Creatinine, Ser 1.62; NT-Pro BNP 531; Potassium 4.4; Sodium 140; TSH 1.640  Recent Lipid Panel    Component Value Date/Time   CHOL 149 12/07/2017 0336   CHOL 129 09/21/2017 1352   TRIG 98  12/07/2017 0336   HDL 33 (L) 12/07/2017 0336   HDL 31 (L) 09/21/2017 1352   CHOLHDL 4.5 12/07/2017 0336   VLDL 20 12/07/2017 0336   LDLCALC 96 12/07/2017 0336   LDLCALC 70 09/21/2017 1352    Physical Exam:    VS:  BP 132/72   Pulse (!) 51   Ht 6' (1.829 m)   Wt 211 lb 6.4 oz (95.9 kg)   SpO2 98%   BMI 28.67 kg/m     Wt Readings from Last 3 Encounters:  01/27/19 211 lb 6.4 oz (95.9 kg)  11/16/18 211 lb 3.2 oz (95.8 kg)  09/06/18 114 lb 3.2 oz (51.8 kg)     GEN: Mast, heavy beard.. No acute distress HEENT: Normal NECK: No JVD. LYMPHATICS: No lymphadenopathy CARDIAC:  RRR without murmur, gallop, or edema. VASCULAR:  Normal Pulses. No bruits. RESPIRATORY:  Clear to auscultation without rales, wheezing or rhonchi  ABDOMEN: Soft, non-tender, non-distended, No pulsatile mass, MUSCULOSKELETAL: No deformity  SKIN: Warm and dry NEUROLOGIC:  Alert and oriented x 3 PSYCHIATRIC:  Normal affect   ASSESSMENT:    1. Chronic combined systolic and diastolic heart failure, NYHA class 2 ([place])   2. Coronary artery disease involving coronary bypass graft of native heart without angina pectoris   3. Atrial flutter, unspecified type ([place])   4. Type 2 diabetes mellitus with other circulatory complication, with long-term current use of insulin ([place])   5. H/O amiodarone therapy   6. Essential hypertension   7. Educated About Covid-19 Virus Infection    PLAN:    In order of problems listed above:  1. Euvolemic based on recent laboratory data.  May even be slightly volume contracted.  Volume contraction may be contributing to his orthostatic weakness and dizziness however he is on a very tight line between volume overload with symptoms and volume depletion.  After evaluating the recent laboratory data we will continue the current dose of furosemide 60 mg daily. 2. Use nitroglycerin liberally to control any episodes of angina 3. No recent recurrence of atrial arrhythmia 4. Blood sugar  control  has been abysmal.  It is been that way for the entirety of our relationship. 5. No evidence of amiodarone toxicity based upon current labs 6. Blood pressure is reasonable but still slightly higher than would be a target for diabetes. 7. Social distancing, handwashing, and mask wearing is emphasized.  Clinical follow-up in 3 months.  Continue same medical regimen.  Be met at the next office visit.  Call if angina accelerates.  Call if dizziness gets worse.  The dizziness he is having is chronic and stable compared to the last office visit.   Medication Adjustments/Labs and Tests Ordered: Current medicines are reviewed at length with the patient today.  Concerns regarding medicines are outlined above.  No orders of the defined types were placed in this encounter.  No orders of the defined types were placed in this encounter.   There are no Patient Instructions on file for this visit.   Signed, [name], MD  01/27/2019 2:04 PM    [place]

## 2019-01-27 ENCOUNTER — Encounter: Payer: Self-pay | Admitting: Interventional Cardiology

## 2019-01-27 ENCOUNTER — Ambulatory Visit (INDEPENDENT_AMBULATORY_CARE_PROVIDER_SITE_OTHER): Payer: Medicare HMO | Admitting: Interventional Cardiology

## 2019-01-27 ENCOUNTER — Other Ambulatory Visit: Payer: Self-pay

## 2019-01-27 VITALS — BP 132/72 | HR 51 | Ht 72.0 in | Wt 211.4 lb

## 2019-01-27 DIAGNOSIS — I10 Essential (primary) hypertension: Secondary | ICD-10-CM

## 2019-01-27 DIAGNOSIS — I4892 Unspecified atrial flutter: Secondary | ICD-10-CM

## 2019-01-27 DIAGNOSIS — I5042 Chronic combined systolic (congestive) and diastolic (congestive) heart failure: Secondary | ICD-10-CM

## 2019-01-27 DIAGNOSIS — E1159 Type 2 diabetes mellitus with other circulatory complications: Secondary | ICD-10-CM | POA: Diagnosis not present

## 2019-01-27 DIAGNOSIS — Z794 Long term (current) use of insulin: Secondary | ICD-10-CM | POA: Diagnosis not present

## 2019-01-27 DIAGNOSIS — Z9229 Personal history of other drug therapy: Secondary | ICD-10-CM | POA: Diagnosis not present

## 2019-01-27 DIAGNOSIS — Z7189 Other specified counseling: Secondary | ICD-10-CM

## 2019-01-27 DIAGNOSIS — I25810 Atherosclerosis of coronary artery bypass graft(s) without angina pectoris: Secondary | ICD-10-CM | POA: Diagnosis not present

## 2019-01-27 NOTE — Patient Instructions (Signed)
Medication Instructions:  Your physician recommends that you continue on your current medications as directed. Please refer to the Current Medication list given to you today.  If you need a refill on your cardiac medications before your next appointment, please call your pharmacy.   Lab work: None If you have labs (blood work) drawn today and your tests are completely normal, you will receive your results only by: . MyChart Message (if you have MyChart) OR . A paper copy in the mail If you have any lab test that is abnormal or we need to change your treatment, we will call you to review the results.  Testing/Procedures: None  Follow-Up: At [facility], you and your health needs are our priority.  As part of our continuing mission to provide you with exceptional heart care, we have created designated Provider Care Teams.  These Care Teams include your primary Cardiologist (physician) and Advanced Practice Providers (APPs -  Physician Assistants and Nurse Practitioners) who all work together to provide you with the care you need, when you need it. You will need a follow up appointment in 3 months.  Please call our office 2 months in advance to schedule this appointment.  You may see Makhai W Smith III, MD or one of the following Advanced Practice Providers on your designated Care Team:   Lori Gerhardt, NP Laura Ingold, NP . Jill McDaniel, NP  Any Other Special Instructions Will Be Listed Below (If Applicable).    

## 2019-01-31 DIAGNOSIS — M5412 Radiculopathy, cervical region: Secondary | ICD-10-CM | POA: Diagnosis not present

## 2019-01-31 DIAGNOSIS — M9901 Segmental and somatic dysfunction of cervical region: Secondary | ICD-10-CM | POA: Diagnosis not present

## 2019-01-31 DIAGNOSIS — M62838 Other muscle spasm: Secondary | ICD-10-CM | POA: Diagnosis not present

## 2019-01-31 DIAGNOSIS — M9902 Segmental and somatic dysfunction of thoracic region: Secondary | ICD-10-CM | POA: Diagnosis not present

## 2019-02-07 DIAGNOSIS — M9901 Segmental and somatic dysfunction of cervical region: Secondary | ICD-10-CM | POA: Diagnosis not present

## 2019-02-07 DIAGNOSIS — M5412 Radiculopathy, cervical region: Secondary | ICD-10-CM | POA: Diagnosis not present

## 2019-02-07 DIAGNOSIS — M62838 Other muscle spasm: Secondary | ICD-10-CM | POA: Diagnosis not present

## 2019-02-07 DIAGNOSIS — M9902 Segmental and somatic dysfunction of thoracic region: Secondary | ICD-10-CM | POA: Diagnosis not present

## 2019-02-14 DIAGNOSIS — M9901 Segmental and somatic dysfunction of cervical region: Secondary | ICD-10-CM | POA: Diagnosis not present

## 2019-02-14 DIAGNOSIS — M62838 Other muscle spasm: Secondary | ICD-10-CM | POA: Diagnosis not present

## 2019-02-14 DIAGNOSIS — M9902 Segmental and somatic dysfunction of thoracic region: Secondary | ICD-10-CM | POA: Diagnosis not present

## 2019-02-14 DIAGNOSIS — M5412 Radiculopathy, cervical region: Secondary | ICD-10-CM | POA: Diagnosis not present

## 2019-02-21 DIAGNOSIS — M9901 Segmental and somatic dysfunction of cervical region: Secondary | ICD-10-CM | POA: Diagnosis not present

## 2019-02-21 DIAGNOSIS — M62838 Other muscle spasm: Secondary | ICD-10-CM | POA: Diagnosis not present

## 2019-02-21 DIAGNOSIS — M5412 Radiculopathy, cervical region: Secondary | ICD-10-CM | POA: Diagnosis not present

## 2019-02-21 DIAGNOSIS — M9902 Segmental and somatic dysfunction of thoracic region: Secondary | ICD-10-CM | POA: Diagnosis not present

## 2019-02-23 ENCOUNTER — Other Ambulatory Visit: Payer: Self-pay | Admitting: Interventional Cardiology

## 2019-03-07 DIAGNOSIS — M62838 Other muscle spasm: Secondary | ICD-10-CM | POA: Diagnosis not present

## 2019-03-07 DIAGNOSIS — M9901 Segmental and somatic dysfunction of cervical region: Secondary | ICD-10-CM | POA: Diagnosis not present

## 2019-03-07 DIAGNOSIS — M9902 Segmental and somatic dysfunction of thoracic region: Secondary | ICD-10-CM | POA: Diagnosis not present

## 2019-03-07 DIAGNOSIS — M5412 Radiculopathy, cervical region: Secondary | ICD-10-CM | POA: Diagnosis not present

## 2019-03-08 ENCOUNTER — Telehealth: Payer: Self-pay

## 2019-03-08 ENCOUNTER — Other Ambulatory Visit: Payer: Self-pay

## 2019-03-08 DIAGNOSIS — C159 Malignant neoplasm of esophagus, unspecified: Secondary | ICD-10-CM

## 2019-03-08 NOTE — Telephone Encounter (Signed)
Addendum to note entered on 03/08/19 at 1155:  New CT order entered to contain Abdomen CT scan only. Unable to discontinue prior order due to appointment already being scheduled.

## 2019-03-08 NOTE — Telephone Encounter (Signed)
Received call from Gaspar Bidding in regards to patient's CT scans scheduled for tomorrow.  Insurance has authorized Abdomen CT scan but has denied Pelvis CT scan. Dr. Alen Blew notified, ok to proceed with just the Abdomen CT scan.

## 2019-03-08 NOTE — Telephone Encounter (Signed)
Per Gaspar Bidding, insurance has approved CT Abdomen and Pelvis for patient on 03/08/19.

## 2019-03-09 ENCOUNTER — Encounter (HOSPITAL_COMMUNITY): Payer: Self-pay

## 2019-03-09 ENCOUNTER — Inpatient Hospital Stay: Payer: Medicare HMO | Attending: Oncology

## 2019-03-09 ENCOUNTER — Ambulatory Visit (HOSPITAL_COMMUNITY)
Admission: RE | Admit: 2019-03-09 | Discharge: 2019-03-09 | Disposition: A | Discharge status: Home / Self Care | Payer: Medicare HMO | Source: Ambulatory Visit | Attending: Oncology | Admitting: Oncology

## 2019-03-09 ENCOUNTER — Other Ambulatory Visit: Payer: Self-pay

## 2019-03-09 DIAGNOSIS — Z9221 Personal history of antineoplastic chemotherapy: Secondary | ICD-10-CM | POA: Diagnosis not present

## 2019-03-09 DIAGNOSIS — Z923 Personal history of irradiation: Secondary | ICD-10-CM | POA: Diagnosis not present

## 2019-03-09 DIAGNOSIS — R634 Abnormal weight loss: Secondary | ICD-10-CM | POA: Diagnosis not present

## 2019-03-09 DIAGNOSIS — C159 Malignant neoplasm of esophagus, unspecified: Secondary | ICD-10-CM | POA: Insufficient documentation

## 2019-03-09 DIAGNOSIS — Z8501 Personal history of malignant neoplasm of esophagus: Secondary | ICD-10-CM | POA: Diagnosis not present

## 2019-03-09 DIAGNOSIS — Z79899 Other long term (current) drug therapy: Secondary | ICD-10-CM | POA: Diagnosis not present

## 2019-03-09 DIAGNOSIS — Z794 Long term (current) use of insulin: Secondary | ICD-10-CM | POA: Diagnosis not present

## 2019-03-09 LAB — CMP (CANCER CENTER ONLY)
ALT: 24 U/L (ref 0–44)
AST: 30 U/L (ref 15–41)
Albumin: 4.2 g/dL (ref 3.5–5.0)
Alkaline Phosphatase: 108 U/L (ref 38–126)
Anion gap: 10 (ref 5–15)
BUN: 24 mg/dL — ABNORMAL HIGH (ref 8–23)
CO2: 28 mmol/L (ref 22–32)
Calcium: 9.6 mg/dL (ref 8.9–10.3)
Chloride: 102 mmol/L (ref 98–111)
Creatinine: 1.62 mg/dL — ABNORMAL HIGH (ref 0.61–1.24)
GFR, Est AFR Am: 49 mL/min — ABNORMAL LOW (ref >60)
GFR, Estimated: 42 mL/min — ABNORMAL LOW (ref >60)
Glucose, Bld: 143 mg/dL — ABNORMAL HIGH (ref 70–99)
Potassium: 4.2 mmol/L (ref 3.5–5.1)
Sodium: 140 mmol/L (ref 135–145)
Total Bilirubin: 0.8 mg/dL (ref 0.3–1.2)
Total Protein: 7.3 g/dL (ref 6.5–8.1)

## 2019-03-09 LAB — CBC WITH DIFFERENTIAL (CANCER CENTER ONLY)
Abs Immature Granulocytes: 0.05 10*3/uL (ref 0.00–0.07)
Basophils Absolute: 0.1 10*3/uL (ref 0.0–0.1)
Basophils Relative: 1 %
Eosinophils Absolute: 0.2 10*3/uL (ref 0.0–0.5)
Eosinophils Relative: 2 %
HCT: 44.4 % (ref 39.0–52.0)
Hemoglobin: 14.3 g/dL (ref 13.0–17.0)
Immature Granulocytes: 1 %
Lymphocytes Relative: 33 %
Lymphs Abs: 2.8 10*3/uL (ref 0.7–4.0)
MCH: 27.8 pg (ref 26.0–34.0)
MCHC: 32.2 g/dL (ref 30.0–36.0)
MCV: 86.4 fL (ref 80.0–100.0)
Monocytes Absolute: 0.8 10*3/uL (ref 0.1–1.0)
Monocytes Relative: 10 %
Neutro Abs: 4.6 10*3/uL (ref 1.7–7.7)
Neutrophils Relative %: 53 %
Platelet Count: 229 10*3/uL (ref 150–400)
RBC: 5.14 MIL/uL (ref 4.22–5.81)
RDW: 16.2 % — ABNORMAL HIGH (ref 11.5–15.5)
WBC Count: 8.5 10*3/uL (ref 4.0–10.5)
nRBC: 0 % (ref 0.0–0.2)

## 2019-03-09 MED ORDER — SODIUM CHLORIDE (PF) 0.9 % IJ SOLN
INTRAMUSCULAR | Status: AC
  Filled 2019-03-09: qty 50

## 2019-03-09 MED ORDER — IOHEXOL 300 MG/ML  SOLN
100.0000 mL | Freq: Once | INTRAMUSCULAR | Status: AC | PRN
  Administered 2019-03-09: 100 mL via INTRAVENOUS

## 2019-03-14 DIAGNOSIS — M5412 Radiculopathy, cervical region: Secondary | ICD-10-CM | POA: Diagnosis not present

## 2019-03-14 DIAGNOSIS — M62838 Other muscle spasm: Secondary | ICD-10-CM | POA: Diagnosis not present

## 2019-03-14 DIAGNOSIS — M9901 Segmental and somatic dysfunction of cervical region: Secondary | ICD-10-CM | POA: Diagnosis not present

## 2019-03-14 DIAGNOSIS — M9902 Segmental and somatic dysfunction of thoracic region: Secondary | ICD-10-CM | POA: Diagnosis not present

## 2019-03-16 ENCOUNTER — Ambulatory Visit: Payer: Medicare HMO | Admitting: Oncology

## 2019-03-22 ENCOUNTER — Other Ambulatory Visit: Payer: Self-pay | Admitting: Interventional Cardiology

## 2019-03-22 NOTE — Telephone Encounter (Signed)
Eliquis 5mg  refill request received; pt is 68yrs old, weight 95.9-kg, Crea- 1.62 on 03/09/2019, Diagnosis-Afib, and last seen by Dr. Tamala Julian on 01/27/2019. Dose is appropriate based on dosing criteria. Will send in refill to requested pharmacy.

## 2019-03-23 ENCOUNTER — Inpatient Hospital Stay: Payer: Medicare HMO | Attending: Oncology | Admitting: Oncology

## 2019-03-23 ENCOUNTER — Other Ambulatory Visit: Payer: Self-pay

## 2019-03-23 VITALS — BP 143/68 | HR 57 | Temp 97.8°F | Resp 18 | Ht 72.0 in | Wt 213.6 lb

## 2019-03-23 DIAGNOSIS — R634 Abnormal weight loss: Secondary | ICD-10-CM | POA: Insufficient documentation

## 2019-03-23 DIAGNOSIS — D509 Iron deficiency anemia, unspecified: Secondary | ICD-10-CM | POA: Insufficient documentation

## 2019-03-23 DIAGNOSIS — Z79899 Other long term (current) drug therapy: Secondary | ICD-10-CM | POA: Insufficient documentation

## 2019-03-23 DIAGNOSIS — Z923 Personal history of irradiation: Secondary | ICD-10-CM | POA: Insufficient documentation

## 2019-03-23 DIAGNOSIS — C159 Malignant neoplasm of esophagus, unspecified: Secondary | ICD-10-CM

## 2019-03-23 DIAGNOSIS — Z7901 Long term (current) use of anticoagulants: Secondary | ICD-10-CM | POA: Diagnosis not present

## 2019-03-23 DIAGNOSIS — R4702 Dysphasia: Secondary | ICD-10-CM | POA: Insufficient documentation

## 2019-03-23 DIAGNOSIS — Z9221 Personal history of antineoplastic chemotherapy: Secondary | ICD-10-CM | POA: Insufficient documentation

## 2019-03-23 DIAGNOSIS — Z794 Long term (current) use of insulin: Secondary | ICD-10-CM | POA: Diagnosis not present

## 2019-03-23 DIAGNOSIS — Z8501 Personal history of malignant neoplasm of esophagus: Secondary | ICD-10-CM | POA: Diagnosis not present

## 2019-03-23 NOTE — Progress Notes (Signed)
Hematology and Oncology Follow Up Visit  [name] [id number] [birth year] 68 y.o. 03/23/2019 10:13 AM Justin Bender, PA-[place], [place] Justin Blackburn   Principle Diagnosis: 68 year old man with T3N0 of the GE junction diagnosed in 2019.  He presented with GI bleeding and found to have adenocarcinoma that is the dominant pathology.   Prior Therapy: He is status post endoscopy and a biopsy in March 2019.  He is s/p  EUS for staging purposes on 09/29/2017 which resulted in T3N0 staging.    Radiation and weekly carboplatin/paclitaxel.  Therapy started on 10/28/2017.  He completed therapy on December 11, 2017.  Current therapy: Active surveillance.  Interim History: [name] is here for a follow-up.  Since last visit, he reports feeling reasonably well without any complaints.  He denies any nausea, vomiting or abdominal pain.  He denies any hematochezia or melena.  He does report some occasional dizziness but no falls or syncope.  His performance status and quality of life remains reasonable.  He does drive short distances but for the most part does not drive for extended period of time.  He denied headaches, blurry vision, syncope or seizures.  Denies any fevers, chills or sweats.  Denied chest pain, palpitation, orthopnea or leg edema.  Denied cough, wheezing or hemoptysis.  Denied nausea, vomiting or abdominal pain.  Denies any constipation or diarrhea.  Denies any frequency urgency or hesitancy.  Denies any arthralgias or myalgias.  Denies any skin rashes or lesions.  Denies any bleeding or clotting tendency.  Denies any easy bruising.  Denies any hair or nail changes.  Denies any anxiety or depression.  Remaining review of system is negative.      Medications: Without any changes on review. Current Outpatient Medications  Medication Sig Dispense Refill  . acetaminophen (TYLENOL) 325 MG tablet Take 2 tablets (650 mg total) by mouth every 6 (six) hours as needed for mild pain or headache.    [name]  amiodarone (PACERONE) 200 MG tablet Take 1 tablet (200 mg total) by mouth daily. 90 tablet 3  . atorvastatin (LIPITOR) 40 MG tablet Take 40 mg by mouth daily at 6 PM.   3  . ELIQUIS 5 MG TABS tablet TAKE 1 TABLET BY MOUTH TWICE A DAY 60 tablet 10  . ferrous sulfate 325 (65 FE) MG tablet Take 325 mg by mouth daily.  3  . furosemide (LASIX) 40 MG tablet Take 1.5 tablets (60 mg total) by mouth daily. 135 tablet 3  . gabapentin (NEURONTIN) 300 MG capsule Take 600 mg by mouth 2 (two) times daily.   1  . insulin degludec (TRESIBA FLEXTOUCH) 100 UNIT/ML SOPN FlexTouch Pen Inject 60 Units into the skin at bedtime.     [name] KLOR-CON M20 20 MEQ tablet TAKE 1 TABLET BY MOUTH EVERY DAY 90 tablet 3  . losartan (COZAAR) 100 MG tablet Take 50 mg by mouth daily.    . metFORMIN (GLUCOPHAGE) 1000 MG tablet Take 1,000 mg by mouth 2 (two) times daily with a meal.    . nitroGLYCERIN (NITROSTAT) 0.4 MG SL tablet PLACE 1 TABLET UNDER THE TONGUE EVERY 5 (FIVE) MINUTES AS NEEDED FOR CHEST PAIN (ANGINA). 25 tablet 0  . pantoprazole (PROTONIX) 40 MG tablet TAKE 1 TABLET BY MOUTH EVERY DAY 90 tablet 1   No current facility-administered medications for this visit.      Allergies: No Known Allergies  Past Medical History, Surgical history, Social history, and Family History updated without any changes.    Physical  Exam:   Blood pressure (!) 143/68, pulse (!) 57, temperature 97.8 F (36.6 C), temperature source Temporal, resp. rate 18, height 6' (1.829 m), weight 213 lb 9.6 oz (96.9 kg), SpO2 100 %.    ECOG: 1   General appearance: Comfortable appearing without any discomfort Head: Normocephalic without any trauma Oropharynx: Mucous membranes are moist and pink without any thrush or ulcers. Eyes: Pupils are equal and round reactive to light. Lymph nodes: No cervical, supraclavicular, inguinal or axillary lymphadenopathy.   Heart:regular rate and rhythm.  S1 and S2 without leg edema. Lung: Clear without any  rhonchi or wheezes.  No dullness to percussion. Abdomin: Soft, nontender, nondistended with good bowel sounds.  No hepatosplenomegaly. Musculoskeletal: No joint deformity or effusion.  Full range of motion noted. Neurological: No deficits noted on motor, sensory and deep tendon reflex exam. Skin: No petechial rash or dryness.  Appeared moist.         Lab Results: Lab Results  Component Value Date   WBC 8.5 03/09/2019   HGB 14.3 03/09/2019   HCT 44.4 03/09/2019   MCV 86.4 03/09/2019   PLT 229 03/09/2019     Chemistry      Component Value Date/Time   NA 140 03/09/2019 1039   NA 140 01/24/2019 0911   K 4.2 03/09/2019 1039   CL 102 03/09/2019 1039   CO2 28 03/09/2019 1039   BUN 24 (H) 03/09/2019 1039   BUN 29 (H) 01/24/2019 0911   CREATININE 1.62 (H) 03/09/2019 1039      Component Value Date/Time   CALCIUM 9.6 03/09/2019 1039   ALKPHOS 108 03/09/2019 1039   AST 30 03/09/2019 1039   ALT 24 03/09/2019 1039   BILITOT 0.8 03/09/2019 1039      EXAM: CT ABDOMEN AND PELVIS WITH CONTRAST  TECHNIQUE: Multidetector CT imaging of the abdomen and pelvis was performed using the standard protocol following bolus administration of intravenous contrast.  CONTRAST:  173mL OMNIPAQUE IOHEXOL 300 MG/ML SOLN, additional oral enteric contrast  COMPARISON:  08/31/2018  FINDINGS: Lower chest: No acute abnormality. Post radiation fibrotic change of the medial bilateral lung bases. Cardiomegaly with coronary artery calcifications.  Hepatobiliary: No solid liver abnormality is seen. Gallstones. Gallbladder wall thickening, or biliary dilatation.  Pancreas: Unremarkable. No pancreatic ductal dilatation or surrounding inflammatory changes.  Spleen: Normal in size without significant abnormality.  Adrenals/Urinary Tract: Adrenal glands are unremarkable. Nonobstructive calculus of the posterior midportion of the right kidney. Bladder is unremarkable.  Stomach/Bowel: No  change in densely calcified post treatment appearance of the distal esophagus, gastroesophageal junction, and gastric cardia (series 2, image 12). Appendix appears normal. No evidence of bowel wall thickening, distention, or inflammatory changes.  Vascular/Lymphatic: Aortic atherosclerosis and vascular calcinosis. Incidental note of a retroaortic left renal vein. No enlarged abdominal or pelvic lymph nodes.  Reproductive: Mild prostatomegaly.  Other: No abdominal wall hernia or abnormality. No abdominopelvic ascites.  Musculoskeletal: No acute or significant osseous findings.  IMPRESSION: 1. No change in densely calcified post treatment appearance of the distal esophagus, gastroesophageal junction, and gastric cardia (series 2, image 12).  2. Post radiation fibrotic change of the medial bilateral lung bases.  3.  Aortic Atherosclerosis (ICD10-I70.0).  4.  Other chronic and incidental findings as detailed above.     Impression and Plan:  68 year old man with:  1.  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   He achieved complete response to definitive therapy utilizing radiation with chemotherapy.  CT scan obtained on  03/09/2019 was personally reviewed and showed no evidence of metastatic disease.  The natural course of this disease as well as risk of relapse was assessed at this time.  I recommended continued active surveillance and repeat imaging studies in 6 months.  Salvage therapy with systemic chemotherapy may be needed if he develops relapsed disease.  He is agreeable with this plan.  2.  Iron deficiency anemia: Related to chronic blood losses from his tumor.  This has resolved with hemoglobin normalized.  3.  Dysphasia and weight loss: Related to his cancer and has resolved at this time.   4.  Follow-up: He will return in 6 months for repeat follow-up and repeat imaging studies.  15   minutes was spent with the patient face-to-face  today.  More than 50% of time was dedicated to discussing the natural course of this disease, assessing risk of relapse and discussing future plan of care.   Zola Button, MD 10/8/202010:13 AM

## 2019-03-28 DIAGNOSIS — M9901 Segmental and somatic dysfunction of cervical region: Secondary | ICD-10-CM | POA: Diagnosis not present

## 2019-03-28 DIAGNOSIS — M9902 Segmental and somatic dysfunction of thoracic region: Secondary | ICD-10-CM | POA: Diagnosis not present

## 2019-03-28 DIAGNOSIS — M5412 Radiculopathy, cervical region: Secondary | ICD-10-CM | POA: Diagnosis not present

## 2019-03-28 DIAGNOSIS — M62838 Other muscle spasm: Secondary | ICD-10-CM | POA: Diagnosis not present

## 2019-04-02 ENCOUNTER — Other Ambulatory Visit: Payer: Self-pay | Admitting: Interventional Cardiology

## 2019-04-05 ENCOUNTER — Telehealth: Payer: Self-pay | Admitting: *Deleted

## 2019-04-05 DIAGNOSIS — H2512 Age-related nuclear cataract, left eye: Secondary | ICD-10-CM | POA: Diagnosis not present

## 2019-04-05 DIAGNOSIS — H2511 Age-related nuclear cataract, right eye: Secondary | ICD-10-CM | POA: Diagnosis not present

## 2019-04-05 DIAGNOSIS — Z01818 Encounter for other preprocedural examination: Secondary | ICD-10-CM | POA: Diagnosis not present

## 2019-04-05 NOTE — Telephone Encounter (Signed)
   [facility] Pre-operative Risk Assessment    Request for surgical clearance:  1. What type of surgery is being performed? CATARACT EXTRACTION BY PE, IOL-LEFT THEN RIGHT    2. When is this surgery scheduled? 04/17/19   3. What type of clearance is required (medical clearance vs. Pharmacy clearance to hold med vs. Both)? MEDICAL  4. Are there any medications that need to be held prior to surgery and how long? PER SURGEON NO BLOOD THINNERS NEED TO BE HELD   5. Practice name and name of physician performing surgery? [facility]; DR. Tama High   6. What is your office phone number [phone]    7.   What is your office fax number [phone]  8.   Anesthesia type (None, local, MAC, general) ? IV SEDATION   Justin Blackburn 04/05/2019, 5:20 PM  _________________________________________________________________   (provider comments below)

## 2019-04-06 NOTE — Telephone Encounter (Signed)
LVM 04/06/19

## 2019-04-10 NOTE — Telephone Encounter (Signed)
   Primary Cardiologist: [name], MD  Chart reviewed as part of pre-operative protocol coverage. Given past medical history and time since last visit, based on ACC/AHA guidelines, Justin Blackburn. would be at acceptable risk for the planned procedure without further cardiovascular testing.   Spoke with patient. Reports symptom improvement from last office visit. No anginal symptoms, SOB or palpitations. Walks with cane at baseline however can still perform greater than 4 METS without anginal symptoms.   I will route this recommendation to the requesting party via Epic fax function and remove from pre-op pool.  Please call with questions.  Kathyrn Drown, NP 04/10/2019, 10:14 AM

## 2019-04-13 DIAGNOSIS — M5412 Radiculopathy, cervical region: Secondary | ICD-10-CM | POA: Diagnosis not present

## 2019-04-13 DIAGNOSIS — M62838 Other muscle spasm: Secondary | ICD-10-CM | POA: Diagnosis not present

## 2019-04-13 DIAGNOSIS — M9902 Segmental and somatic dysfunction of thoracic region: Secondary | ICD-10-CM | POA: Diagnosis not present

## 2019-04-13 DIAGNOSIS — M9901 Segmental and somatic dysfunction of cervical region: Secondary | ICD-10-CM | POA: Diagnosis not present

## 2019-04-14 DIAGNOSIS — I480 Paroxysmal atrial fibrillation: Secondary | ICD-10-CM | POA: Diagnosis not present

## 2019-04-14 DIAGNOSIS — I25119 Atherosclerotic heart disease of native coronary artery with unspecified angina pectoris: Secondary | ICD-10-CM | POA: Diagnosis not present

## 2019-04-14 DIAGNOSIS — E1140 Type 2 diabetes mellitus with diabetic neuropathy, unspecified: Secondary | ICD-10-CM | POA: Diagnosis not present

## 2019-04-14 DIAGNOSIS — C160 Malignant neoplasm of cardia: Secondary | ICD-10-CM | POA: Diagnosis not present

## 2019-04-14 DIAGNOSIS — E1165 Type 2 diabetes mellitus with hyperglycemia: Secondary | ICD-10-CM | POA: Diagnosis not present

## 2019-04-14 DIAGNOSIS — Z79899 Other long term (current) drug therapy: Secondary | ICD-10-CM | POA: Diagnosis not present

## 2019-04-14 DIAGNOSIS — Z6828 Body mass index (BMI) 28.0-28.9, adult: Secondary | ICD-10-CM | POA: Diagnosis not present

## 2019-04-14 DIAGNOSIS — I10 Essential (primary) hypertension: Secondary | ICD-10-CM | POA: Diagnosis not present

## 2019-04-17 DIAGNOSIS — H25812 Combined forms of age-related cataract, left eye: Secondary | ICD-10-CM | POA: Diagnosis not present

## 2019-04-17 DIAGNOSIS — H2512 Age-related nuclear cataract, left eye: Secondary | ICD-10-CM | POA: Diagnosis not present

## 2019-04-20 ENCOUNTER — Telehealth: Payer: Self-pay | Admitting: *Deleted

## 2019-04-20 DIAGNOSIS — M9901 Segmental and somatic dysfunction of cervical region: Secondary | ICD-10-CM | POA: Diagnosis not present

## 2019-04-20 DIAGNOSIS — M62838 Other muscle spasm: Secondary | ICD-10-CM | POA: Diagnosis not present

## 2019-04-20 DIAGNOSIS — E785 Hyperlipidemia, unspecified: Secondary | ICD-10-CM

## 2019-04-20 DIAGNOSIS — M5412 Radiculopathy, cervical region: Secondary | ICD-10-CM | POA: Diagnosis not present

## 2019-04-20 DIAGNOSIS — M9902 Segmental and somatic dysfunction of thoracic region: Secondary | ICD-10-CM | POA: Diagnosis not present

## 2019-04-20 MED ORDER — ROSUVASTATIN CALCIUM 40 MG PO TABS: 40.0000 mg | ORAL_TABLET | Freq: Every day | ORAL | 3 refills | Status: AC

## 2019-04-20 NOTE — Telephone Encounter (Signed)
Pt called back and he is taking Atorvastatin 40mg  QD.  Advised to stop this and start Rosuvastatin.  Pt has an upcoming appt with Dr. Tamala Julian this month.  Advised we will set up f/u labs when he comes in for that.  Pt verbalized understanding and was appreciative for call.

## 2019-04-20 NOTE — Telephone Encounter (Signed)
-----   Message from Belva Crome, MD sent at 04/20/2019  1:12 PM EST ----- Let the patient know not optimal. Change atorvastatin to Rosuvastatin 40 mg daily. Liver and lipid early January. A copy will be sent to [name], PA-C

## 2019-04-21 ENCOUNTER — Other Ambulatory Visit: Payer: Self-pay | Admitting: Cardiology

## 2019-04-27 DIAGNOSIS — M9901 Segmental and somatic dysfunction of cervical region: Secondary | ICD-10-CM | POA: Diagnosis not present

## 2019-04-27 DIAGNOSIS — M5412 Radiculopathy, cervical region: Secondary | ICD-10-CM | POA: Diagnosis not present

## 2019-04-27 DIAGNOSIS — M62838 Other muscle spasm: Secondary | ICD-10-CM | POA: Diagnosis not present

## 2019-04-27 DIAGNOSIS — M9902 Segmental and somatic dysfunction of thoracic region: Secondary | ICD-10-CM | POA: Diagnosis not present

## 2019-04-29 NOTE — Progress Notes (Signed)
Cardiology Office Note:    Date:  05/01/2019   ID:  Justin Blackburn., DOB 1950/07/02, MRN [id number]  PCP:  [name], PA-C  Cardiologist:  [name], MD   Referring MD: [name], PA-C   Chief Complaint  Patient presents with  . Coronary Artery Disease  . Congestive Heart Failure  . Atrial Fibrillation    History of Present Illness:    Justin Blackburn. is a 68 y.o. male with a hx of CAD, CABG x2, recent left main and circumflex DES, upper GI bleed related to previously undiagnosed esophageal cancer, chemotherapy, paroxysmal atrial fibrillation, anticoagulation therapy, acute on chronic systolic and diastolic heart failure related to atrial fibrillation, returning after recent admission for GI bleed.  Skip still has an uneasy lightheaded feeling when he stands.  It eventually goes away although it causes him to be overly cautious and not engage in some activities at he would ordinarily.  He is not having orthopnea or PND.  He is not needing sublingual nitroglycerin.  There is no significant lower extremity swelling.  No medication side effects are reported.  He is compliant with his current regimen.  Past Medical History:  Diagnosis Date  . A-fib ([employer])    with RVR  . Acute kidney injury ([place])   . Anemia   . Angina   . Arthritis    "hands" (08/10/2017)  . Atrial flutter, paroxysmal ([place])    arrived with aflutter with RVR, EF down to 25-30%, s/p TEE DCCV on 01/29/2015  . Cancer ([place])   . CHF (congestive heart failure) ([place])   . Chronic lower back pain    "all the time from my sciatic nerve"  . Coronary artery disease   . DVT (deep venous thrombosis) ([place])   . Dyspnea   . Erectile dysfunction   . Heart failure ([place])   . Hypercholesteremia   . Hypertension   . 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 Myocardial infarction ([place]) 1988; ~8416;  ~1996;~ 2000  . Obesity   . Peripheral vascular disease ([place])   . Umbilical hernia    unrepaired    Past Surgical History:  Procedure Laterality Date  . CARDIOVERSION N/A 01/29/2015   Procedure: CARDIOVERSION;  Surgeon: [name], MD;  Location: [place];  Service: Cardiovascular;  Laterality: N/A;  . CARDIOVERSION N/A 04/26/2018   Procedure: CARDIOVERSION;  Surgeon: Josue Hector, MD;  Location: [facility] ENDOSCOPY;  Service: Cardiovascular;  Laterality: N/A;  . CORONARY ANGIOPLASTY WITH STENT PLACEMENT  08/31/11   "2 today; makes total of 3 stents"  . CORONARY ANGIOPLASTY WITH STENT PLACEMENT  08/10/2017   "2 today; makes a total of 5 stents" (08/10/2017)  . CORONARY ARTERY BYPASS GRAFT  1998; 2007   CABG X 4; CABG X4  . CORONARY STENT INTERVENTION N/A 08/10/2017   Procedure: CORONARY STENT INTERVENTION;  Surgeon: Sherren Mocha, MD;  Location: [place] CV LAB;  Service: Cardiovascular;  Laterality: N/A;  . ESOPHAGOGASTRODUODENOSCOPY (EGD) WITH PROPOFOL N/A 09/01/2017   Procedure: ESOPHAGOGASTRODUODENOSCOPY (EGD) WITH PROPOFOL;  Surgeon: Laurence Spates, MD;  Location: [place];  Service: Endoscopy;  Laterality: N/A;  . EUS N/A 09/29/2017   Procedure: UPPER ENDOSCOPIC ULTRASOUND (EUS) LINEAR;  Surgeon: Arta Silence, MD;  Location: [facility] ENDOSCOPY;  Service: Endoscopy;  Laterality: N/A;  . LEFT HEART CATH AND CORS/GRAFTS ANGIOGRAPHY N/A 08/10/2017   Procedure: LEFT HEART CATH AND CORS/GRAFTS ANGIOGRAPHY;  Surgeon: Sherren Mocha, MD;  Location: [place] CV LAB;  Service: Cardiovascular;  Laterality: N/A;  . LEFT HEART CATHETERIZATION WITH CORONARY/GRAFT ANGIOGRAM N/A 08/31/2011   Procedure: LEFT HEART CATHETERIZATION WITH Beatrix Fetters;  Surgeon: [name], MD;  Location: [facility] CATH LAB;  Service: Cardiovascular;  Laterality: N/A;  . PERIPHERAL VASCULAR CATHETERIZATION N/A 04/27/2016   Procedure: Abdominal Aortogram w/Lower Extremity;  Surgeon: Angelia Mould, MD;  Location: [place] CV LAB;  Service: Cardiovascular;  Laterality: N/A;  .  TEE WITHOUT CARDIOVERSION N/A 01/29/2015   Procedure: TRANSESOPHAGEAL ECHOCARDIOGRAM (TEE);  Surgeon: [name], MD;  Location: [facility] ENDOSCOPY;  Service: Cardiovascular;  Laterality: N/A;    Current Medications: Current Meds  Medication Sig  . acetaminophen (TYLENOL) 325 MG tablet Take 2 tablets (650 mg total) by mouth every 6 (six) hours as needed for mild pain or headache.  [name] amiodarone (PACERONE) 200 MG tablet TAKE 1 TABLET BY MOUTH EVERY DAY  . ELIQUIS 5 MG TABS tablet TAKE 1 TABLET BY MOUTH TWICE A DAY  . ferrous sulfate 325 (65 FE) MG tablet Take 325 mg by mouth daily.  . furosemide (LASIX) 40 MG tablet Take 1.5 tablets (60 mg total) by mouth daily.  [name] gabapentin (NEURONTIN) 300 MG capsule Take 600 mg by mouth 2 (two) times daily.   . insulin degludec (TRESIBA FLEXTOUCH) 100 UNIT/ML SOPN FlexTouch Pen Inject 60 Units into the skin at bedtime.   [name] KLOR-CON M20 20 MEQ tablet TAKE 1 TABLET BY MOUTH EVERY DAY  . losartan (COZAAR) 100 MG tablet Take 50 mg by mouth daily.  . metFORMIN (GLUCOPHAGE) 1000 MG tablet Take 1,000 mg by mouth 2 (two) times daily with a meal.  . nitroGLYCERIN (NITROSTAT) 0.4 MG SL tablet PLACE 1 TABLET UNDER THE TONGUE EVERY 5 (FIVE) MINUTES AS NEEDED FOR CHEST PAIN (ANGINA).  [name] pantoprazole (PROTONIX) 40 MG tablet TAKE 1 TABLET BY MOUTH EVERY DAY  . rosuvastatin (CRESTOR) 40 MG tablet Take 1 tablet (40 mg total) by mouth daily.     Allergies:   Patient has no known allergies.   Social History   Socioeconomic History  . Marital status: Married    Spouse name: Not on file  . Number of children: Not on file  . Years of education: Not on file  . Highest education level: Not on file  Occupational History  . Not on file  Social Needs  . Financial resource strain: Not on file  . Food insecurity    Worry: Not on file    Inability: Not on file  . Transportation needs    Medical: Not on file    Non-medical: Not on file  Tobacco Use  . Smoking status:  Former Smoker    Packs/day: 2.00    Years: 6.00    Pack years: 12.00    Types: Cigarettes    Quit date: 06/16/1971    Years since quitting: 47.9  . Smokeless tobacco: Never Used  . Tobacco comment: "chewed on cigars; never really did chew tobacco"  Substance and Sexual Activity  . Alcohol use: No  . Drug use: No  . Sexual activity: Never  Lifestyle  . Physical activity    Days per week: Not on file    Minutes per session: Not on file  . Stress: Not on file  Relationships  . Social Herbalist on phone: Not on file    Gets together: Not on file    Attends religious service: Not on file    Active member of club or  organization: Not on file    Attends meetings of clubs or organizations: Not on file    Relationship status: Not on file  Other Topics Concern  . Not on file  Social History Narrative  . Not on file     Family History: The patient's family history includes Diabetes in his father and mother; Heart attack in his brother, father, and mother; Heart disease in his father and mother. There is no history of Stroke.  ROS:   Please see the history of present illness.    He is using a cane.  Memory is good.  Appetite is good.  He is able to lay flat.  All other systems reviewed and are negative.  EKGs/Labs/Other Studies Reviewed:    The following studies were reviewed today: No new or recent imaging  EKG:  EKG not repeated  Recent Labs: 01/24/2019: NT-Pro BNP 531; TSH 1.640 03/09/2019: ALT 24; BUN 24; Creatinine 1.62; Hemoglobin 14.3; Platelet Count 229; Potassium 4.2; Sodium 140  Recent Lipid Panel    Component Value Date/Time   CHOL 149 12/07/2017 0336   CHOL 129 09/21/2017 1352   TRIG 98 12/07/2017 0336   HDL 33 (L) 12/07/2017 0336   HDL 31 (L) 09/21/2017 1352   CHOLHDL 4.5 12/07/2017 0336   VLDL 20 12/07/2017 0336   LDLCALC 96 12/07/2017 0336   LDLCALC 70 09/21/2017 1352    Physical Exam:    VS:  BP 134/78   Pulse 61   Ht 6' (1.829 m)   Wt  206 lb 1.9 oz (93.5 kg)   SpO2 98%   BMI 27.95 kg/m     Wt Readings from Last 3 Encounters:  05/01/19 206 lb 1.9 oz (93.5 kg)  03/23/19 213 lb 9.6 oz (96.9 kg)  01/27/19 211 lb 6.4 oz (95.9 kg)     GEN: Appears older than stated age mostly because of his gray beard.. No acute distress HEENT: Normal NECK: No JVD. LYMPHATICS: No lymphadenopathy CARDIAC:  RRR without murmur, gallop, trace ankle edema. VASCULAR:  Normal Pulses. No bruits. RESPIRATORY:  Clear to auscultation without rales, wheezing or rhonchi  ABDOMEN: Soft, non-tender, non-distended, No pulsatile mass, MUSCULOSKELETAL: No deformity  SKIN: Warm and dry NEUROLOGIC:  Alert and oriented x 3 PSYCHIATRIC:  Normal affect   ASSESSMENT:    1. Chronic combined systolic and diastolic heart failure, NYHA class 2 ([place])   2. Coronary artery disease involving coronary bypass graft of native heart without angina pectoris   3. Atrial flutter, unspecified type ([place])   4. Type 2 diabetes mellitus with other circulatory complication, with long-term current use of insulin ([place])   5. H/O amiodarone therapy   6. Essential hypertension   7. Anticoagulated   8. CKD stage 3 due to type 2 diabetes mellitus ([place])   9. Educated about COVID-19 virus infection    PLAN:    In order of problems listed above:  1. No significant volume overload is noted. 2. Secondary prevention discussed. 3. No recurrence and rhythm control is being achieved with amiodarone 4. Hemoglobin A1c was 7 recently 5. TSH and liver panel done in October were normal. 6. Excellent current blood pressure control 7. Kidney function is stable 8. 3W's is practiced to avoid COVID-19 infection.  Guideline directed therapy for left ventricular systolic dysfunction: Angiotensin receptor-neprilysin inhibitor (ARNI)-Entresto; beta-blocker therapy - carvedilol or metoprolol succinate; mineralocorticoid receptor antagonist (MRA) therapy -spironolactone or eplerenone.  These  therapies have been shown to improve clinical outcomes including reduction of  rehospitalization survival, and acute heart failure.  Kidney dysfunction which worsens with angiotensin system blockade is preventing optimal heart failure therapy.  Medication Adjustments/Labs and Tests Ordered: Current medicines are reviewed at length with the patient today.  Concerns regarding medicines are outlined above.  No orders of the defined types were placed in this encounter.  No orders of the defined types were placed in this encounter.   Patient Instructions  Medication Instructions:  Your physician recommends that you continue on your current medications as directed. Please refer to the Current Medication list given to you today.  *If you need a refill on your cardiac medications before your next appointment, please call your pharmacy*  Lab Work: None If you have labs (blood work) drawn today and your tests are completely normal, you will receive your results only by: [name] MyChart Message (if you have MyChart) OR . A paper copy in the mail If you have any lab test that is abnormal or we need to change your treatment, we will call you to review the results.  Testing/Procedures: None  Follow-Up: At [facility], you and your health needs are our priority.  As part of our continuing mission to provide you with exceptional heart care, we have created designated Provider Care Teams.  These Care Teams include your primary Cardiologist (physician) and Advanced Practice Providers (APPs -  Physician Assistants and Nurse Practitioners) who all work together to provide you with the care you need, when you need it.  Your next appointment:   6 months  The format for your next appointment:   In Person  Provider:   You may see [name], MD or one of the following Advanced Practice Providers on your designated Care Team:    Truitt Merle, NP  Cecilie Kicks, NP  Kathyrn Drown, NP   Other  Instructions      Signed, [name], MD  05/01/2019 10:46 AM    [place]

## 2019-05-01 ENCOUNTER — Other Ambulatory Visit: Payer: Self-pay

## 2019-05-01 ENCOUNTER — Encounter: Payer: Self-pay | Admitting: Interventional Cardiology

## 2019-05-01 ENCOUNTER — Ambulatory Visit (INDEPENDENT_AMBULATORY_CARE_PROVIDER_SITE_OTHER): Payer: Medicare HMO | Admitting: Interventional Cardiology

## 2019-05-01 VITALS — BP 134/78 | HR 61 | Ht 72.0 in | Wt 206.1 lb

## 2019-05-01 DIAGNOSIS — Z9229 Personal history of other drug therapy: Secondary | ICD-10-CM | POA: Diagnosis not present

## 2019-05-01 DIAGNOSIS — E1159 Type 2 diabetes mellitus with other circulatory complications: Secondary | ICD-10-CM | POA: Diagnosis not present

## 2019-05-01 DIAGNOSIS — E1122 Type 2 diabetes mellitus with diabetic chronic kidney disease: Secondary | ICD-10-CM | POA: Diagnosis not present

## 2019-05-01 DIAGNOSIS — I25810 Atherosclerosis of coronary artery bypass graft(s) without angina pectoris: Secondary | ICD-10-CM

## 2019-05-01 DIAGNOSIS — N183 Chronic kidney disease, stage 3 unspecified: Secondary | ICD-10-CM

## 2019-05-01 DIAGNOSIS — I10 Essential (primary) hypertension: Secondary | ICD-10-CM

## 2019-05-01 DIAGNOSIS — I4892 Unspecified atrial flutter: Secondary | ICD-10-CM

## 2019-05-01 DIAGNOSIS — I5042 Chronic combined systolic (congestive) and diastolic (congestive) heart failure: Secondary | ICD-10-CM

## 2019-05-01 DIAGNOSIS — Z794 Long term (current) use of insulin: Secondary | ICD-10-CM | POA: Diagnosis not present

## 2019-05-01 DIAGNOSIS — Z7189 Other specified counseling: Secondary | ICD-10-CM

## 2019-05-01 DIAGNOSIS — Z7901 Long term (current) use of anticoagulants: Secondary | ICD-10-CM

## 2019-05-01 NOTE — Patient Instructions (Signed)
Medication Instructions:  Your physician recommends that you continue on your current medications as directed. Please refer to the Current Medication list given to you today.  *If you need a refill on your cardiac medications before your next appointment, please call your pharmacy*  Lab Work: None If you have labs (blood work) drawn today and your tests are completely normal, you will receive your results only by: . MyChart Message (if you have MyChart) OR . A paper copy in the mail If you have any lab test that is abnormal or we need to change your treatment, we will call you to review the results.  Testing/Procedures: None  Follow-Up: At [facility], you and your health needs are our priority.  As part of our continuing mission to provide you with exceptional heart care, we have created designated Provider Care Teams.  These Care Teams include your primary Cardiologist (physician) and Advanced Practice Providers (APPs -  Physician Assistants and Nurse Practitioners) who all work together to provide you with the care you need, when you need it.  Your next appointment:   6 month(s)  The format for your next appointment:   In Person  Provider:   You may see Corvin W Smith III, MD or one of the following Advanced Practice Providers on your designated Care Team:    Lori Gerhardt, NP  Laura Ingold, NP  Jill McDaniel, NP   Other Instructions   

## 2019-05-04 DIAGNOSIS — M62838 Other muscle spasm: Secondary | ICD-10-CM | POA: Diagnosis not present

## 2019-05-04 DIAGNOSIS — M9902 Segmental and somatic dysfunction of thoracic region: Secondary | ICD-10-CM | POA: Diagnosis not present

## 2019-05-04 DIAGNOSIS — M9901 Segmental and somatic dysfunction of cervical region: Secondary | ICD-10-CM | POA: Diagnosis not present

## 2019-05-04 DIAGNOSIS — M5412 Radiculopathy, cervical region: Secondary | ICD-10-CM | POA: Diagnosis not present

## 2019-05-15 DIAGNOSIS — H2511 Age-related nuclear cataract, right eye: Secondary | ICD-10-CM | POA: Diagnosis not present

## 2019-05-15 DIAGNOSIS — H25811 Combined forms of age-related cataract, right eye: Secondary | ICD-10-CM | POA: Diagnosis not present

## 2019-05-15 DIAGNOSIS — H2512 Age-related nuclear cataract, left eye: Secondary | ICD-10-CM | POA: Diagnosis not present

## 2019-06-19 DIAGNOSIS — Z9181 History of falling: Secondary | ICD-10-CM | POA: Diagnosis not present

## 2019-06-19 DIAGNOSIS — Z125 Encounter for screening for malignant neoplasm of prostate: Secondary | ICD-10-CM | POA: Diagnosis not present

## 2019-06-19 DIAGNOSIS — Z1331 Encounter for screening for depression: Secondary | ICD-10-CM | POA: Diagnosis not present

## 2019-06-19 DIAGNOSIS — Z0000 Encounter for general adult medical examination without abnormal findings: Secondary | ICD-10-CM | POA: Diagnosis not present

## 2019-06-19 DIAGNOSIS — Z139 Encounter for screening, unspecified: Secondary | ICD-10-CM | POA: Diagnosis not present

## 2019-06-19 DIAGNOSIS — E785 Hyperlipidemia, unspecified: Secondary | ICD-10-CM | POA: Diagnosis not present

## 2019-06-20 DIAGNOSIS — Z0100 Encounter for examination of eyes and vision without abnormal findings: Secondary | ICD-10-CM | POA: Diagnosis not present

## 2019-07-24 ENCOUNTER — Other Ambulatory Visit: Payer: Self-pay | Admitting: Interventional Cardiology

## 2019-08-05 ENCOUNTER — Ambulatory Visit: Payer: Medicare HMO

## 2019-08-14 DIAGNOSIS — Z79899 Other long term (current) drug therapy: Secondary | ICD-10-CM | POA: Diagnosis not present

## 2019-08-14 DIAGNOSIS — E1140 Type 2 diabetes mellitus with diabetic neuropathy, unspecified: Secondary | ICD-10-CM | POA: Diagnosis not present

## 2019-08-14 DIAGNOSIS — E1165 Type 2 diabetes mellitus with hyperglycemia: Secondary | ICD-10-CM | POA: Diagnosis not present

## 2019-08-14 DIAGNOSIS — I10 Essential (primary) hypertension: Secondary | ICD-10-CM | POA: Diagnosis not present

## 2019-08-14 DIAGNOSIS — I480 Paroxysmal atrial fibrillation: Secondary | ICD-10-CM | POA: Diagnosis not present

## 2019-08-14 DIAGNOSIS — C160 Malignant neoplasm of cardia: Secondary | ICD-10-CM | POA: Diagnosis not present

## 2019-08-14 DIAGNOSIS — Z6828 Body mass index (BMI) 28.0-28.9, adult: Secondary | ICD-10-CM | POA: Diagnosis not present

## 2019-08-14 DIAGNOSIS — I25119 Atherosclerotic heart disease of native coronary artery with unspecified angina pectoris: Secondary | ICD-10-CM | POA: Diagnosis not present

## 2019-08-17 DIAGNOSIS — C160 Malignant neoplasm of cardia: Secondary | ICD-10-CM | POA: Diagnosis not present

## 2019-08-17 DIAGNOSIS — Z1211 Encounter for screening for malignant neoplasm of colon: Secondary | ICD-10-CM | POA: Diagnosis not present

## 2019-08-17 DIAGNOSIS — Z8679 Personal history of other diseases of the circulatory system: Secondary | ICD-10-CM | POA: Diagnosis not present

## 2019-08-30 DIAGNOSIS — N289 Disorder of kidney and ureter, unspecified: Secondary | ICD-10-CM | POA: Diagnosis not present

## 2019-09-19 ENCOUNTER — Inpatient Hospital Stay: Payer: Medicare HMO

## 2019-09-22 ENCOUNTER — Telehealth: Payer: Self-pay | Admitting: Oncology

## 2019-09-22 NOTE — Telephone Encounter (Signed)
Called pt per 4/9 sch message - no answer - left message for pt to call back

## 2019-09-26 ENCOUNTER — Inpatient Hospital Stay: Payer: Medicare HMO | Admitting: Oncology

## 2019-09-26 ENCOUNTER — Ambulatory Visit
Admission: RE | Admit: 2019-09-26 | Discharge: 2019-09-26 | Disposition: A | Discharge status: Home / Self Care | Payer: Medicare HMO | Source: Ambulatory Visit | Attending: Oncology | Admitting: Oncology

## 2019-09-26 ENCOUNTER — Other Ambulatory Visit: Payer: Self-pay

## 2019-09-26 DIAGNOSIS — J479 Bronchiectasis, uncomplicated: Secondary | ICD-10-CM | POA: Diagnosis not present

## 2019-09-26 DIAGNOSIS — K228 Other specified diseases of esophagus: Secondary | ICD-10-CM | POA: Diagnosis not present

## 2019-09-26 DIAGNOSIS — N200 Calculus of kidney: Secondary | ICD-10-CM | POA: Diagnosis not present

## 2019-09-26 DIAGNOSIS — C159 Malignant neoplasm of esophagus, unspecified: Secondary | ICD-10-CM | POA: Diagnosis present

## 2019-10-04 ENCOUNTER — Other Ambulatory Visit: Payer: Self-pay | Admitting: Interventional Cardiology

## 2019-10-05 ENCOUNTER — Inpatient Hospital Stay: Payer: Medicare HMO

## 2019-10-05 ENCOUNTER — Telehealth: Payer: Self-pay | Admitting: Oncology

## 2019-10-05 ENCOUNTER — Inpatient Hospital Stay: Payer: Medicare HMO | Attending: Oncology | Admitting: Oncology

## 2019-10-05 ENCOUNTER — Other Ambulatory Visit: Payer: Self-pay

## 2019-10-05 VITALS — BP 161/84 | HR 60 | Temp 97.8°F | Resp 17 | Ht 72.0 in | Wt 210.6 lb

## 2019-10-05 DIAGNOSIS — C159 Malignant neoplasm of esophagus, unspecified: Secondary | ICD-10-CM | POA: Diagnosis not present

## 2019-10-05 DIAGNOSIS — C160 Malignant neoplasm of cardia: Secondary | ICD-10-CM | POA: Diagnosis not present

## 2019-10-05 DIAGNOSIS — Z7901 Long term (current) use of anticoagulants: Secondary | ICD-10-CM | POA: Insufficient documentation

## 2019-10-05 DIAGNOSIS — R634 Abnormal weight loss: Secondary | ICD-10-CM | POA: Insufficient documentation

## 2019-10-05 DIAGNOSIS — R4702 Dysphasia: Secondary | ICD-10-CM | POA: Insufficient documentation

## 2019-10-05 DIAGNOSIS — D509 Iron deficiency anemia, unspecified: Secondary | ICD-10-CM | POA: Insufficient documentation

## 2019-10-05 DIAGNOSIS — Z79899 Other long term (current) drug therapy: Secondary | ICD-10-CM | POA: Insufficient documentation

## 2019-10-05 DIAGNOSIS — I700 Atherosclerosis of aorta: Secondary | ICD-10-CM | POA: Insufficient documentation

## 2019-10-05 DIAGNOSIS — I2510 Atherosclerotic heart disease of native coronary artery without angina pectoris: Secondary | ICD-10-CM | POA: Insufficient documentation

## 2019-10-05 LAB — CMP (CANCER CENTER ONLY)
ALT: 19 U/L (ref 0–44)
AST: 22 U/L (ref 15–41)
Albumin: 3.8 g/dL (ref 3.5–5.0)
Alkaline Phosphatase: 97 U/L (ref 38–126)
Anion gap: 9 (ref 5–15)
BUN: 29 mg/dL — ABNORMAL HIGH (ref 8–23)
CO2: 25 mmol/L (ref 22–32)
Calcium: 9.1 mg/dL (ref 8.9–10.3)
Chloride: 103 mmol/L (ref 98–111)
Creatinine: 2.53 mg/dL — ABNORMAL HIGH (ref 0.61–1.24)
GFR, Est AFR Am: 28 mL/min — ABNORMAL LOW (ref >60)
GFR, Estimated: 25 mL/min — ABNORMAL LOW (ref >60)
Glucose, Bld: 270 mg/dL — ABNORMAL HIGH (ref 70–99)
Potassium: 4.2 mmol/L (ref 3.5–5.1)
Sodium: 137 mmol/L (ref 135–145)
Total Bilirubin: 0.6 mg/dL (ref 0.3–1.2)
Total Protein: 7.3 g/dL (ref 6.5–8.1)

## 2019-10-05 LAB — CBC WITH DIFFERENTIAL (CANCER CENTER ONLY)
Abs Immature Granulocytes: 0.02 10*3/uL (ref 0.00–0.07)
Basophils Absolute: 0.1 10*3/uL (ref 0.0–0.1)
Basophils Relative: 1 %
Eosinophils Absolute: 0.1 10*3/uL (ref 0.0–0.5)
Eosinophils Relative: 2 %
HCT: 45.3 % (ref 39.0–52.0)
Hemoglobin: 14.2 g/dL (ref 13.0–17.0)
Immature Granulocytes: 0 %
Lymphocytes Relative: 30 %
Lymphs Abs: 2.4 10*3/uL (ref 0.7–4.0)
MCH: 26 pg (ref 26.0–34.0)
MCHC: 31.3 g/dL (ref 30.0–36.0)
MCV: 82.8 fL (ref 80.0–100.0)
Monocytes Absolute: 0.6 10*3/uL (ref 0.1–1.0)
Monocytes Relative: 8 %
Neutro Abs: 4.9 10*3/uL (ref 1.7–7.7)
Neutrophils Relative %: 59 %
Platelet Count: 243 10*3/uL (ref 150–400)
RBC: 5.47 MIL/uL (ref 4.22–5.81)
RDW: 15.9 % — ABNORMAL HIGH (ref 11.5–15.5)
WBC Count: 8.1 10*3/uL (ref 4.0–10.5)
nRBC: 0 % (ref 0.0–0.2)

## 2019-10-05 NOTE — Progress Notes (Signed)
Hematology and Oncology Follow Up Visit  Justin Blackburn [id number] 11/27/1950 69 y.o. 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  He was found to have T3N0 disease at that time.   Prior Therapy: He is status post endoscopy and a biopsy in March 2019.  He is s/p  EUS for staging purposes on 09/29/2017 which resulted in T3N0 staging.    Radiation and weekly carboplatin/paclitaxel.  Therapy started on 10/28/2017.  He completed therapy on December 11, 2017.  Current therapy: Active surveillance.  Interim History: Justin Blackburn returns today for a follow-up evaluation.  Since the last visit, he reports no major changes in his health.  He continues to be active and attends to activities of daily living.  He denies any nausea or vomiting or abdominal pain.  He denies any hematochezia or melena.  His performance status and quality of life remained excellent.      Medications: Reviewed without changes. Current Outpatient Medications  Medication Sig Dispense Refill  . acetaminophen (TYLENOL) 325 MG tablet Take 2 tablets (650 mg total) by mouth every 6 (six) hours as needed for mild pain or headache.    [name] amiodarone (PACERONE) 200 MG tablet TAKE 1 TABLET BY MOUTH EVERY DAY 90 tablet 3  . ELIQUIS 5 MG TABS tablet TAKE 1 TABLET BY MOUTH TWICE A DAY 60 tablet 10  . ferrous sulfate 325 (65 FE) MG tablet Take 325 mg by mouth daily.  3  . furosemide (LASIX) 40 MG tablet TAKE 1.5 TABLETS (60 MG TOTAL) BY MOUTH DAILY. 135 tablet 2  . gabapentin (NEURONTIN) 300 MG capsule Take 600 mg by mouth 2 (two) times daily.   1  . insulin degludec (TRESIBA FLEXTOUCH) 100 UNIT/ML SOPN FlexTouch Pen Inject 60 Units into the skin at bedtime.     [name] KLOR-CON M20 20 MEQ tablet TAKE 1 TABLET BY MOUTH EVERY DAY 90 tablet 3  . losartan (COZAAR) 100 MG tablet TAKE 1 TABLET BY MOUTH EVERY DAY  90 tablet 2  . metFORMIN (GLUCOPHAGE) 1000 MG tablet Take 1,000 mg by mouth 2 (two) times daily with a meal.    . nitroGLYCERIN (NITROSTAT) 0.4 MG SL tablet PLACE 1 TABLET UNDER THE TONGUE EVERY 5 (FIVE) MINUTES AS NEEDED FOR CHEST PAIN (ANGINA). 25 tablet 0  . pantoprazole (PROTONIX) 40 MG tablet TAKE 1 TABLET BY MOUTH EVERY DAY 90 tablet 1  . rosuvastatin (CRESTOR) 40 MG tablet Take 1 tablet (40 mg total) by mouth daily. 90 tablet 3   No current facility-administered medications for this visit.     Allergies: No Known Allergies      Physical Exam:   Blood pressure (!) 161/84, pulse 60, temperature 97.8 F (36.6 C), temperature source Oral, resp. rate 17, height 6' (1.829 m), weight 210 lb 9.6 oz (95.5 kg), SpO2 100 %.     ECOG: 1    General appearance: Alert, awake without any distress. Head: Atraumatic without abnormalities Oropharynx: Without any thrush or ulcers. Eyes: No scleral icterus. Lymph nodes: No lymphadenopathy noted in the cervical, supraclavicular, or axillary nodes Heart:regular rate and rhythm, without any murmurs or gallops.   Lung: Clear to auscultation without any rhonchi, wheezes or dullness to percussion. Abdomin: Soft, nontender without any shifting dullness or ascites. Musculoskeletal: No clubbing or cyanosis. Neurological: No motor or sensory deficits. Skin: No rashes or lesions.  Lab Results: Lab Results  Component Value Date   WBC 8.5 03/09/2019   HGB 14.3 03/09/2019   HCT 44.4 03/09/2019   MCV 86.4 03/09/2019   PLT 229 03/09/2019     Chemistry      Component Value Date/Time   NA 140 03/09/2019 1039   NA 140 01/24/2019 0911   K 4.2 03/09/2019 1039   CL 102 03/09/2019 1039   CO2 28 03/09/2019 1039   BUN 24 (H) 03/09/2019 1039   BUN 29 (H) 01/24/2019 0911   CREATININE 1.62 (H) 03/09/2019 1039      Component Value Date/Time   CALCIUM 9.6 03/09/2019 1039   ALKPHOS 108 03/09/2019 1039   AST 30 03/09/2019 1039    ALT 24 03/09/2019 1039   BILITOT 0.8 03/09/2019 1039      IMPRESSION: 1. Mild mural thickening in the distal esophagus, gastroesophageal junction and cardia of the stomach where there is extensive calcification, compatible with treated neoplasm. No findings to suggest local recurrence of disease or definite metastatic disease in the chest, abdomen or pelvis. 2. Cylindrical bronchiectasis in the lung bases, similar to prior studies, which may be sequela of recurrent aspiration and/or prior radiation therapy. 3. Aortic atherosclerosis, in addition to left main and 3 vessel coronary artery disease. Status post median sternotomy for CABG including LIMA to the LAD. Ectasia of ascending thoracic aorta (4.4 cm in diameter). Recommend annual imaging followup by CTA or MRA. This recommendation follows 2010 ACCF/AHA/AATS/ACR/ASA/SCA/SCAI/SIR/STS/SVM Guidelines for the Diagnosis and Management of Patients with Thoracic Aortic Disease. Circulation. 2010; 121: F790-W409. Aortic aneurysm NOS (ICD10-I71.9). 4. 7 mm nonobstructive calculus in the interpolar collecting system of the right kidney. 5. Additional incidental findings, as above.  Impression and Plan:  69 year old man with:  1.  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   He continues to be on active surveillance at this time without any evidence of relapsed disease.  Risks and benefits of continued active surveillance at this time was discussed.  CT scan obtained on September 26, 2019 was reviewed personally and discussed with the patient.  He has no evidence to suggest relapsed disease at this time.  I have recommended continued active surveillance in 6 months and annually after that.  He developed relapsed disease salvage chemotherapy will be instituted at that time.  2.  Iron deficiency anemia: Resolved upon treating his cancer at this time.  His hemoglobin today was reviewed and was  within normal range.  3.  Dysphasia and weight loss: Resolved at this time and is eating well and maintaining weight.   4.  Follow-up: In 6 months for routine follow-up and repeat imaging studies.  30  minutes were dedicated to this encounter.  The time was spent on reviewing imaging studies, laboratory data, treatment options and future plan of care discussion.  Zola Button, MD 4/22/202111:48 AM

## 2019-10-05 NOTE — Telephone Encounter (Signed)
Scheduled per 04/22 los, patient has been called and notified.  

## 2019-10-21 ENCOUNTER — Other Ambulatory Visit: Payer: Self-pay | Admitting: Interventional Cardiology

## 2019-11-08 DIAGNOSIS — M5412 Radiculopathy, cervical region: Secondary | ICD-10-CM | POA: Diagnosis not present

## 2019-11-08 DIAGNOSIS — M9902 Segmental and somatic dysfunction of thoracic region: Secondary | ICD-10-CM | POA: Diagnosis not present

## 2019-11-08 DIAGNOSIS — M62838 Other muscle spasm: Secondary | ICD-10-CM | POA: Diagnosis not present

## 2019-11-08 DIAGNOSIS — M9901 Segmental and somatic dysfunction of cervical region: Secondary | ICD-10-CM | POA: Diagnosis not present

## 2019-11-15 DIAGNOSIS — M5412 Radiculopathy, cervical region: Secondary | ICD-10-CM | POA: Diagnosis not present

## 2019-11-15 DIAGNOSIS — M9902 Segmental and somatic dysfunction of thoracic region: Secondary | ICD-10-CM | POA: Diagnosis not present

## 2019-11-15 DIAGNOSIS — M9901 Segmental and somatic dysfunction of cervical region: Secondary | ICD-10-CM | POA: Diagnosis not present

## 2019-11-15 DIAGNOSIS — M62838 Other muscle spasm: Secondary | ICD-10-CM | POA: Diagnosis not present

## 2019-11-29 DIAGNOSIS — M9902 Segmental and somatic dysfunction of thoracic region: Secondary | ICD-10-CM | POA: Diagnosis not present

## 2019-11-29 DIAGNOSIS — M5412 Radiculopathy, cervical region: Secondary | ICD-10-CM | POA: Diagnosis not present

## 2019-11-29 DIAGNOSIS — M9901 Segmental and somatic dysfunction of cervical region: Secondary | ICD-10-CM | POA: Diagnosis not present

## 2019-11-29 DIAGNOSIS — M62838 Other muscle spasm: Secondary | ICD-10-CM | POA: Diagnosis not present

## 2019-12-04 ENCOUNTER — Other Ambulatory Visit: Payer: Self-pay | Admitting: Pharmacist

## 2019-12-10 NOTE — Progress Notes (Signed)
Cardiology Office Note:    Date:  12/11/2019   ID:  Justin Kida., DOB 28-Jan-1951, MRN [id number]  PCP:  [name], PA-C  Cardiologist:  [name], MD   Referring MD: [name], PA-C   Chief Complaint  Patient presents with  . Coronary Artery Disease  . Congestive Heart Failure    History of Present Illness:    Justin Ngo. is a 69 y.o. male with a hx of CAD, CABG x2, recent left main and circumflex DES, upper GI bleed related to previously undiagnosed esophageal cancer, chemotherapy, paroxysmal atrial fibrillation, anticoagulation therapy, acute on chronic systolic and diastolic heart failure related to atrial fibrillation, returning after recent admission for GI bleed.  He appears frail.  He and his wife took a trip to [place] and drove across the [place] from [place] to [place].  He did relatively well.  No angina.  He is limited by shortness of breath.  His appetite is not well controlled for diabetic.  Most recent hemoglobin A1c was 9%.  He has not needed to use nitroglycerin.  He denies orthopnea.  No lower extremity swelling is noted.  Past Medical History:  Diagnosis Date  . A-fib ([place])    with RVR  . Acute kidney injury ([place])   . Anemia   . Angina   . Arthritis    "hands" (08/10/2017)  . Atrial flutter, paroxysmal ([place])    arrived with aflutter with RVR, EF down to 25-30%, s/p TEE DCCV on 01/29/2015  . Cancer ([place])   . CHF (congestive heart failure) ([place])   . Chronic lower back pain    "all the time from my sciatic nerve"  . Coronary artery disease   . DVT (deep venous thrombosis) ([place])   . Dyspnea   . Erectile dysfunction   . Heart failure ([place])   . Hypercholesteremia   . Hypertension   . 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 Myocardial infarction ([place]) 1988; ~4098;  ~1996;~ 2000  . Obesity   . Peripheral vascular disease ([place])   . Umbilical hernia    unrepaired    Past Surgical History:    Procedure Laterality Date  . CARDIOVERSION N/A 01/29/2015   Procedure: CARDIOVERSION;  Surgeon: [name], MD;  Location: [facility] [facility];  Service: Cardiovascular;  Laterality: N/A;  . CARDIOVERSION N/A 04/26/2018   Procedure: CARDIOVERSION;  Surgeon: Josue Hector, MD;  Location[facility] ENDOSCOPY;  Service: Cardiovascular;  Laterality: N/A;  . CORONARY ANGIOPLASTY WITH STENT PLACEMENT  08/31/11   "2 today; makes total of 3 stents"  . CORONARY ANGIOPLASTY WITH STENT PLACEMENT  08/10/2017   "2 today; makes a total of 5 stents" (08/10/2017)  . CORONARY ARTERY BYPASS GRAFT  1998; 2007   CABG X 4; CABG X4  . CORONARY STENT INTERVENTION N/A 08/10/2017   Procedure: CORONARY STENT INTERVENTION;  Surgeon: Sherren Mocha, MD;  Location: [place] CV LAB;  Service: Cardiovascular;  Laterality: N/A;  . ESOPHAGOGASTRODUODENOSCOPY (EGD) WITH PROPOFOL N/A 09/01/2017   Procedure: ESOPHAGOGASTRODUODENOSCOPY (EGD) WITH PROPOFOL;  Surgeon: Laurence Spates, MD;  Location: [place];  Service: Endoscopy;  Laterality: N/A;  . EUS N/A 09/29/2017   Procedure: UPPER ENDOSCOPIC ULTRASOUND (EUS) LINEAR;  Surgeon: Arta Silence, MD;  Location: [facility] ENDOSCOPY;  Service: Endoscopy;  Laterality: N/A;  . LEFT HEART CATH AND CORS/GRAFTS ANGIOGRAPHY N/A 08/10/2017   Procedure: LEFT HEART CATH AND CORS/GRAFTS ANGIOGRAPHY;  Surgeon: Sherren Mocha, MD;  Location: [facility]  INVASIVE CV LAB;  Service: Cardiovascular;  Laterality: N/A;  . LEFT HEART CATHETERIZATION WITH CORONARY/GRAFT ANGIOGRAM N/A 08/31/2011   Procedure: LEFT HEART CATHETERIZATION WITH Beatrix Fetters;  Surgeon: [name], MD;  Location[facility] C[facility] LAB;  Service: Cardiovascular;  Laterality: N/A;  . PERIPHERAL VASCULAR CATHETERIZATION N/A 04/27/2016   Procedure: Abdominal Aortogram w/Lower Extremity;  Surgeon: Angelia Mould, MD;  Location: [place] [facility] CV LAB;  Service: Cardiovascular;  Laterality: N/A;  . TEE WITHOUT CARDIOVERSION N/A 01/29/2015    Procedure: TRANSESOPHAGEAL ECHOCARDIOGRAM (TEE);  Surgeon: [name], MD;  Location: [facility] ENDOSCOPY;  Service: Cardiovascular;  Laterality: N/A;    Current Medications: Current Meds  Medication Sig  . acetaminophen (TYLENOL) 325 MG tablet Take 2 tablets (650 mg total) by mouth every 6 (six) hours as needed for mild pain or headache.  [name] amiodarone (PACERONE) 200 MG tablet TAKE 1 TABLET BY MOUTH EVERY DAY  . ELIQUIS 5 MG TABS tablet TAKE 1 TABLET BY MOUTH TWICE A DAY  . ferrous sulfate 325 (65 FE) MG tablet Take 325 mg by mouth daily.  . furosemide (LASIX) 40 MG tablet TAKE 1.5 TABLETS (60 MG TOTAL) BY MOUTH DAILY.  [name] gabapentin (NEURONTIN) 300 MG capsule Take 300 mg by mouth 2 (two) times daily.  . insulin degludec (TRESIBA FLEXTOUCH) 100 UNIT/ML SOPN FlexTouch Pen Inject 60 Units into the skin at bedtime.   [name] KLOR-CON M20 20 MEQ tablet TAKE 1 TABLET BY MOUTH EVERY DAY  . losartan (COZAAR) 100 MG tablet TAKE 1 TABLET BY MOUTH EVERY DAY  . metFORMIN (GLUCOPHAGE) 1000 MG tablet Take 1,000 mg by mouth 2 (two) times daily with a meal.  . nitroGLYCERIN (NITROSTAT) 0.4 MG SL tablet PLACE 1 TABLET UNDER THE TONGUE EVERY 5 (FIVE) MINUTES AS NEEDED FOR CHEST PAIN (ANGINA).  [name] pantoprazole (PROTONIX) 40 MG tablet TAKE 1 TABLET BY MOUTH EVERY DAY     Allergies:   Patient has no known allergies.   Social History   Socioeconomic History  . Marital status: Married    Spouse name: Not on file  . Number of children: Not on file  . Years of education: Not on file  . Highest education level: Not on file  Occupational History  . Not on file  Tobacco Use  . Smoking status: Former Smoker    Packs/day: 2.00    Years: 6.00    Pack years: 12.00    Types: Cigarettes    Quit date: 06/16/1971    Years since quitting: 48.5  . Smokeless tobacco: Never Used  . Tobacco comment: "chewed on cigars; never really did chew tobacco"  Vaping Use  . Vaping Use: Never used  Substance and Sexual Activity  .  Alcohol use: No  . Drug use: No  . Sexual activity: Never  Other Topics Concern  . Not on file  Social History Narrative  . Not on file   Social Determinants of Health   Financial Resource Strain:   . Difficulty of Paying Living Expenses:   Food Insecurity:   . Worried About Charity fundraiser in the Last Year:   . Arboriculturist in the Last Year:   Transportation Needs:   . Film/video editor (Medical):   [name] Lack of Transportation (Non-Medical):   Physical Activity:   . Days of Exercise per Week:   . Minutes of Exercise per Session:   Stress:   . Feeling of Stress :   Social Connections:   .  Frequency of Communication with Friends and Family:   . Frequency of Social Gatherings with Friends and Family:   . Attends Religious Services:   . Active Member of Clubs or Organizations:   . Attends Archivist Meetings:   [name] Marital Status:      Family History: The patient's family history includes Diabetes in his father and mother; Heart attack in his brother, father, and mother; Heart disease in his father and mother. There is no history of Stroke.  ROS:   Please see the history of present illness.    Denies claudication.  No recent nitroglycerin use.  All other systems reviewed and are negative.  EKGs/Labs/Other Studies Reviewed:    The following studies were reviewed today: ECHOCARDIOGRAM 2019: Study Conclusions   - Left ventricle: The cavity size was normal. There was moderate  concentric hypertrophy. Systolic function was mildly reduced. The  estimated ejection fraction was in the range of 45% to 50%. There  is akinesis of the basal-midinferolateral and inferior  myocardium. There is hypokinesis of the basalinferoseptal  myocardium. The study was not technically sufficient to allow  evaluation of LV diastolic dysfunction due to atrial  fibrillation.  - Mitral valve: Calcified annulus. There was trivial regurgitation.  - Left atrium: The  atrium was mildly dilated.  - Right ventricle: The cavity size was moderately dilated. Wall  thickness was normal. Systolic function was mildly to moderately  reduced.  - Right atrium: The atrium was mildly dilated.  - Tricuspid valve: There was mild regurgitation.  - Pulmonary arteries: PA peak pressure: 44 mm Hg (S).   Impressions:   - The right ventricular systolic pressure was increased consistent  with moderate pulmonary hypertension.   EKG:  EKG normal sinus rhythm, QS pattern V1 through V5.  Left axis deviation.  Sinus bradycardia.  Recent Labs: 01/24/2019: NT-Pro BNP 531; TSH 1.640 10/05/2019: ALT 19; BUN 29; Creatinine 2.53; Hemoglobin 14.2; Platelet Count 243; Potassium 4.2; Sodium 137  Recent Lipid Panel    Component Value Date/Time   CHOL 149 12/07/2017 0336   CHOL 129 09/21/2017 1352   TRIG 98 12/07/2017 0336   HDL 33 (L) 12/07/2017 0336   HDL 31 (L) 09/21/2017 1352   CHOLHDL 4.5 12/07/2017 0336   VLDL 20 12/07/2017 0336   LDLCALC 96 12/07/2017 0336   LDLCALC 70 09/21/2017 1352    Physical Exam:    VS:  BP 128/68   Pulse (!) 53   Ht 6' (1.829 m)   Wt 209 lb (94.8 kg)   SpO2 99%   BMI 28.35 kg/m     Wt Readings from Last 3 Encounters:  12/11/19 209 lb (94.8 kg)  10/05/19 210 lb 9.6 oz (95.5 kg)  05/01/19 206 lb 1.9 oz (93.5 kg)     GEN: Appears older than stated age. No acute distress HEENT: Normal NECK: No JVD. LYMPHATICS: No lymphadenopathy CARDIAC:  RRR without murmur, gallop, or edema. VASCULAR: Diminished pulses in both feet. No bruits. RESPIRATORY:  Clear to auscultation without rales, wheezing or rhonchi  ABDOMEN: Soft, non-tender, non-distended, No pulsatile mass, MUSCULOSKELETAL: No deformity  SKIN: Warm and dry NEUROLOGIC:  Alert and oriented x 3 PSYCHIATRIC:  Normal affect   ASSESSMENT:    1. AF (paroxysmal atrial fibrillation) (HCC)   2. Chronic combined systolic and diastolic heart failure, NYHA class 2 ([place])   3. Coronary  artery disease involving coronary bypass graft of native heart with angina pectoris ([place])   4. Type 2 diabetes mellitus  with other circulatory complication, with long-term current use of insulin ([place])   5. H/O amiodarone therapy   6. Essential hypertension   7. CKD stage 3 due to type 2 diabetes mellitus ([place])   8. Hyperlipidemia, unspecified hyperlipidemia type   9. Educated about COVID-19 virus infection    PLAN:    In order of problems listed above:  1. Maintaining normal sinus rhythm.  A comprehensive metabolic panel will be done in [redacted] week along with TSH. 2. Start Farxiga 5 mg/day.  Comprehensive metabolic panel and 1 week.  Plan to start [place] add additional coverage for heart failure and also kidney protection.  Unable to use Entresto because of kidney failure. 3. Secondary prevention discussed. 4. Farxiga 5 mg/day is being added. 5. TSH should be done when blood work is obtained 6. Blood pressure is excellent.  May need to cut back on furosemide intensity after adding [place]. Justin Blackburn is being added 8. Continue high intensity statin 9. Covid vaccine has been received.  Overall education and awareness concerning primary/secondary risk prevention was discussed in detail: LDL less than 70, hemoglobin A1c less than 7, blood pressure target less than 130/80 mmHg, >150 minutes of moderate aerobic activity per week, avoidance of smoking, weight control (via diet and exercise), and continued surveillance/management of/for obstructive sleep apnea.    Medication Adjustments/Labs and Tests Ordered: Current medicines are reviewed at length with the patient today.  Concerns regarding medicines are outlined above.  Orders Placed This Encounter  Procedures  . EKG 12-Lead  . ECHOCARDIOGRAM COMPLETE   Meds ordered this encounter  Medications  . dapagliflozin propanediol (FARXIGA) 5 MG TABS tablet    Sig: Take 1 tablet (5 mg total) by mouth daily before breakfast.    Dispense:  30  tablet    Refill:  11    Patient Instructions  Medication Instructions:  1) START Farxiga 5mg  once daily  *If you need a refill on your cardiac medications before your next appointment, please call your pharmacy*   Lab Work: None If you have labs (blood work) drawn today and your tests are completely normal, you will receive your results only by: [name] MyChart Message (if you have MyChart) OR . A paper copy in the mail If you have any lab test that is abnormal or we need to change your treatment, we will call you to review the results.   Testing/Procedures: Your physician has requested that you have an echocardiogram before we see you back but needs to be at least a month after starting [place]. Echocardiography is a painless test that uses sound waves to create images of your heart. It provides your doctor with information about the size and shape of your heart and how well your heart's chambers and valves are working. This procedure takes approximately one hour. There are no restrictions for this procedure.     Follow-Up: At [facility], you and your health needs are our priority.  As part of our continuing mission to provide you with exceptional heart care, we have created designated Provider Care Teams.  These Care Teams include your primary Cardiologist (physician) and Advanced Practice Providers (APPs -  Physician Assistants and Nurse Practitioners) who all work together to provide you with the care you need, when you need it.  We recommend signing up for the patient portal called "MyChart".  Sign up information is provided on this After Visit Summary.  MyChart is used to connect with patients for Virtual Visits (Telemedicine).  Patients  are able to view lab/test results, encounter notes, upcoming appointments, etc.  Non-urgent messages can be sent to your provider as well.   To learn more about what you can do with MyChart, go to [email or web address].    Your next appointment:    3-6 month(s)  The format for your next appointment:   In Person  Provider:   You may see [name], MD or one of the following Advanced Practice Providers on your designated Care Team:    Truitt Merle, NP  Cecilie Kicks, NP  Kathyrn Drown, NP    Other Instructions      Signed, [name], MD  12/11/2019 10:20 AM    [place]

## 2019-12-11 ENCOUNTER — Other Ambulatory Visit: Payer: Self-pay

## 2019-12-11 ENCOUNTER — Ambulatory Visit (INDEPENDENT_AMBULATORY_CARE_PROVIDER_SITE_OTHER): Payer: Medicare HMO | Admitting: Interventional Cardiology

## 2019-12-11 ENCOUNTER — Encounter: Payer: Self-pay | Admitting: Interventional Cardiology

## 2019-12-11 VITALS — BP 128/68 | HR 53 | Ht 72.0 in | Wt 209.0 lb

## 2019-12-11 DIAGNOSIS — E1122 Type 2 diabetes mellitus with diabetic chronic kidney disease: Secondary | ICD-10-CM | POA: Diagnosis not present

## 2019-12-11 DIAGNOSIS — I25709 Atherosclerosis of coronary artery bypass graft(s), unspecified, with unspecified angina pectoris: Secondary | ICD-10-CM

## 2019-12-11 DIAGNOSIS — I480 Paroxysmal atrial fibrillation: Secondary | ICD-10-CM | POA: Diagnosis not present

## 2019-12-11 DIAGNOSIS — Z9229 Personal history of other drug therapy: Secondary | ICD-10-CM | POA: Diagnosis not present

## 2019-12-11 DIAGNOSIS — Z7189 Other specified counseling: Secondary | ICD-10-CM

## 2019-12-11 DIAGNOSIS — E1159 Type 2 diabetes mellitus with other circulatory complications: Secondary | ICD-10-CM | POA: Diagnosis not present

## 2019-12-11 DIAGNOSIS — I5042 Chronic combined systolic (congestive) and diastolic (congestive) heart failure: Secondary | ICD-10-CM | POA: Diagnosis not present

## 2019-12-11 DIAGNOSIS — E785 Hyperlipidemia, unspecified: Secondary | ICD-10-CM

## 2019-12-11 DIAGNOSIS — Z794 Long term (current) use of insulin: Secondary | ICD-10-CM | POA: Diagnosis not present

## 2019-12-11 DIAGNOSIS — I10 Essential (primary) hypertension: Secondary | ICD-10-CM

## 2019-12-11 DIAGNOSIS — N183 Chronic kidney disease, stage 3 unspecified: Secondary | ICD-10-CM

## 2019-12-11 MED ORDER — DAPAGLIFLOZIN PROPANEDIOL 5 MG PO TABS: 5.0000 mg | ORAL_TABLET | Freq: Every day | ORAL | 11 refills | Status: DC

## 2019-12-11 NOTE — Patient Instructions (Addendum)
Medication Instructions:  1) START Farxiga 5mg  once daily  *If you need a refill on your cardiac medications before your next appointment, please call your pharmacy*   Lab Work: None If you have labs (blood work) drawn today and your tests are completely normal, you will receive your results only by: [name] MyChart Message (if you have MyChart) OR . A paper copy in the mail If you have any lab test that is abnormal or we need to change your treatment, we will call you to review the results.   Testing/Procedures: Your physician has requested that you have an echocardiogram before we see you back but needs to be at least a month after starting [place]. Echocardiography is a painless test that uses sound waves to create images of your heart. It provides your doctor with information about the size and shape of your heart and how well your heart's chambers and valves are working. This procedure takes approximately one hour. There are no restrictions for this procedure.     Follow-Up: At [facility], you and your health needs are our priority.  As part of our continuing mission to provide you with exceptional heart care, we have created designated Provider Care Teams.  These Care Teams include your primary Cardiologist (physician) and Advanced Practice Providers (APPs -  Physician Assistants and Nurse Practitioners) who all work together to provide you with the care you need, when you need it.  We recommend signing up for the patient portal called "MyChart".  Sign up information is provided on this After Visit Summary.  MyChart is used to connect with patients for Virtual Visits (Telemedicine).  Patients are able to view lab/test results, encounter notes, upcoming appointments, etc.  Non-urgent messages can be sent to your provider as well.   To learn more about what you can do with MyChart, go to [email or web address].    Your next appointment:   3-6 month(s)  The format for your next  appointment:   In Person  Provider:   You may see [name], MD or one of the following Advanced Practice Providers on your designated Care Team:    Truitt Merle, NP  Cecilie Kicks, NP  Kathyrn Drown, NP    Other Instructions

## 2019-12-14 DIAGNOSIS — M62838 Other muscle spasm: Secondary | ICD-10-CM | POA: Diagnosis not present

## 2019-12-14 DIAGNOSIS — M5412 Radiculopathy, cervical region: Secondary | ICD-10-CM | POA: Diagnosis not present

## 2019-12-14 DIAGNOSIS — M9901 Segmental and somatic dysfunction of cervical region: Secondary | ICD-10-CM | POA: Diagnosis not present

## 2019-12-14 DIAGNOSIS — M9902 Segmental and somatic dysfunction of thoracic region: Secondary | ICD-10-CM | POA: Diagnosis not present

## 2019-12-20 DIAGNOSIS — M9902 Segmental and somatic dysfunction of thoracic region: Secondary | ICD-10-CM | POA: Diagnosis not present

## 2019-12-20 DIAGNOSIS — M62838 Other muscle spasm: Secondary | ICD-10-CM | POA: Diagnosis not present

## 2019-12-20 DIAGNOSIS — M9901 Segmental and somatic dysfunction of cervical region: Secondary | ICD-10-CM | POA: Diagnosis not present

## 2019-12-20 DIAGNOSIS — M5412 Radiculopathy, cervical region: Secondary | ICD-10-CM | POA: Diagnosis not present

## 2019-12-21 DIAGNOSIS — Z125 Encounter for screening for malignant neoplasm of prostate: Secondary | ICD-10-CM | POA: Diagnosis not present

## 2019-12-21 DIAGNOSIS — I25119 Atherosclerotic heart disease of native coronary artery with unspecified angina pectoris: Secondary | ICD-10-CM | POA: Diagnosis not present

## 2019-12-21 DIAGNOSIS — E1140 Type 2 diabetes mellitus with diabetic neuropathy, unspecified: Secondary | ICD-10-CM | POA: Diagnosis not present

## 2019-12-21 DIAGNOSIS — D509 Iron deficiency anemia, unspecified: Secondary | ICD-10-CM | POA: Diagnosis not present

## 2019-12-21 DIAGNOSIS — C160 Malignant neoplasm of cardia: Secondary | ICD-10-CM | POA: Diagnosis not present

## 2019-12-21 DIAGNOSIS — I10 Essential (primary) hypertension: Secondary | ICD-10-CM | POA: Diagnosis not present

## 2019-12-21 DIAGNOSIS — Z79899 Other long term (current) drug therapy: Secondary | ICD-10-CM | POA: Diagnosis not present

## 2019-12-21 DIAGNOSIS — I480 Paroxysmal atrial fibrillation: Secondary | ICD-10-CM | POA: Diagnosis not present

## 2019-12-21 DIAGNOSIS — Z6828 Body mass index (BMI) 28.0-28.9, adult: Secondary | ICD-10-CM | POA: Diagnosis not present

## 2020-01-04 DIAGNOSIS — M62838 Other muscle spasm: Secondary | ICD-10-CM | POA: Diagnosis not present

## 2020-01-04 DIAGNOSIS — M9901 Segmental and somatic dysfunction of cervical region: Secondary | ICD-10-CM | POA: Diagnosis not present

## 2020-01-04 DIAGNOSIS — M5412 Radiculopathy, cervical region: Secondary | ICD-10-CM | POA: Diagnosis not present

## 2020-01-04 DIAGNOSIS — M9902 Segmental and somatic dysfunction of thoracic region: Secondary | ICD-10-CM | POA: Diagnosis not present

## 2020-01-08 DIAGNOSIS — Z6828 Body mass index (BMI) 28.0-28.9, adult: Secondary | ICD-10-CM | POA: Diagnosis not present

## 2020-01-08 DIAGNOSIS — N289 Disorder of kidney and ureter, unspecified: Secondary | ICD-10-CM | POA: Diagnosis not present

## 2020-01-08 DIAGNOSIS — I5042 Chronic combined systolic (congestive) and diastolic (congestive) heart failure: Secondary | ICD-10-CM | POA: Diagnosis not present

## 2020-01-08 DIAGNOSIS — E1140 Type 2 diabetes mellitus with diabetic neuropathy, unspecified: Secondary | ICD-10-CM | POA: Diagnosis not present

## 2020-01-08 DIAGNOSIS — E1165 Type 2 diabetes mellitus with hyperglycemia: Secondary | ICD-10-CM | POA: Diagnosis not present

## 2020-01-08 DIAGNOSIS — I10 Essential (primary) hypertension: Secondary | ICD-10-CM | POA: Diagnosis not present

## 2020-01-17 ENCOUNTER — Telehealth: Payer: Self-pay | Admitting: Interventional Cardiology

## 2020-01-17 DIAGNOSIS — M62838 Other muscle spasm: Secondary | ICD-10-CM | POA: Diagnosis not present

## 2020-01-17 DIAGNOSIS — M9902 Segmental and somatic dysfunction of thoracic region: Secondary | ICD-10-CM | POA: Diagnosis not present

## 2020-01-17 DIAGNOSIS — M9901 Segmental and somatic dysfunction of cervical region: Secondary | ICD-10-CM | POA: Diagnosis not present

## 2020-01-17 DIAGNOSIS — M5412 Radiculopathy, cervical region: Secondary | ICD-10-CM | POA: Diagnosis not present

## 2020-01-17 NOTE — Telephone Encounter (Signed)
Pt c/o medication issue:  1. Name of Medication: dapagliflozin propanediol (FARXIGA) 5 MG TABS tablet  2. How are you currently taking this medication (dosage and times per day)? As directed  3. Are you having a reaction (difficulty breathing--STAT)? no  4. What is your medication issue? Wife of patient needs to know if the patient is to continue this medication.    The patient thinks he is supposed to come in for an OV 1 month after taking the medication and called to set that up. Informed the wife that the patient is scheduled for 03/14/20. The wife feels that may be too long and she wanted to know if he could be seen sooner

## 2020-01-17 NOTE — Telephone Encounter (Signed)
Last ov note from Dr. Tamala Julian 6/ 28 21:  In order of problems listed above:  1. Maintaining normal sinus rhythm.  A comprehensive metabolic panel will be done in [redacted] week along with TSH. 2. Start Farxiga 5 mg/day.  Comprehensive metabolic panel and 1 week.  Plan to start Aline add additional coverage for heart failure and also kidney protection.    _____________________________________________________________________________ I called the patient back. He had lab work this week at [facility] with his primary.  He is aware I will call there and request copy of labs for review by Dr. Tamala Julian.  Informed patient of echo appointment next week.  And follow up that is scheduled 03/14/20 is right in line w the 3-6 month follow up that was recommended.

## 2020-01-22 NOTE — Telephone Encounter (Signed)
Spoke with Justin Blackburn at [facility].  Pt had labs 7/8 and 7/26.  She will fax over a copy of those to our office.

## 2020-01-22 NOTE — Telephone Encounter (Signed)
Labs received and are in the to be scanned folder in medical records

## 2020-01-23 ENCOUNTER — Other Ambulatory Visit: Payer: Self-pay

## 2020-01-23 ENCOUNTER — Ambulatory Visit (HOSPITAL_COMMUNITY): Payer: Medicare HMO | Attending: Cardiology

## 2020-01-23 DIAGNOSIS — I5042 Chronic combined systolic (congestive) and diastolic (congestive) heart failure: Secondary | ICD-10-CM | POA: Diagnosis not present

## 2020-01-23 LAB — ECHOCARDIOGRAM COMPLETE
Area-P 1/2: 1.54 cm2
S' Lateral: 3.9 cm

## 2020-01-23 MED ORDER — NITROGLYCERIN 0.4 MG SL SUBL: SUBLINGUAL_TABLET | SUBLINGUAL | 6 refills | Status: AC

## 2020-01-23 NOTE — Telephone Encounter (Signed)
Pt's medication was sent to pt's pharmacy as requested. Confirmation received.  °

## 2020-01-23 NOTE — Telephone Encounter (Signed)
Decrease Losartan to 50 mg daily. Decrease furosemide to 40 mg daily rather than 60 mg daily. BMET 1 week.

## 2020-01-23 NOTE — Telephone Encounter (Signed)
Labs now available for viewing.  Pt was started on Farxiga on 12/11/19.  CMP scanned in from [facility] on 7/8 and one from 7/26.  Creatinine was 3.09 on 7/8 and 2.94 on 7/26.  Creatinine was 2.53 in April when the [facility] checked it.

## 2020-01-24 NOTE — Telephone Encounter (Signed)
Spoke with pt and made him aware of recommendations.  Pt states on 7/8 when Creatinine was elevated, PCP dropped his Losartan to 50mg  and Furosemide to 40mg .  So he has been on this for a month now.  Labs were repeated on 7/26 and Creatinine had improved so PCP left meds the same.

## 2020-01-24 NOTE — Telephone Encounter (Signed)
Thanks

## 2020-03-02 ENCOUNTER — Other Ambulatory Visit: Payer: Self-pay

## 2020-03-02 ENCOUNTER — Encounter (HOSPITAL_COMMUNITY): Payer: Self-pay

## 2020-03-02 ENCOUNTER — Emergency Department (HOSPITAL_COMMUNITY): Payer: Medicare HMO

## 2020-03-02 ENCOUNTER — Emergency Department (HOSPITAL_COMMUNITY)
Admission: EM | Admit: 2020-03-02 | Discharge: 2020-03-02 | Disposition: A | Discharge status: Home / Self Care | Payer: Medicare HMO | Attending: Emergency Medicine | Admitting: Emergency Medicine

## 2020-03-02 DIAGNOSIS — I5042 Chronic combined systolic (congestive) and diastolic (congestive) heart failure: Secondary | ICD-10-CM | POA: Insufficient documentation

## 2020-03-02 DIAGNOSIS — Z8501 Personal history of malignant neoplasm of esophagus: Secondary | ICD-10-CM | POA: Diagnosis not present

## 2020-03-02 DIAGNOSIS — R001 Bradycardia, unspecified: Secondary | ICD-10-CM | POA: Diagnosis not present

## 2020-03-02 DIAGNOSIS — Z794 Long term (current) use of insulin: Secondary | ICD-10-CM | POA: Diagnosis not present

## 2020-03-02 DIAGNOSIS — Z7901 Long term (current) use of anticoagulants: Secondary | ICD-10-CM | POA: Insufficient documentation

## 2020-03-02 DIAGNOSIS — E11649 Type 2 diabetes mellitus with hypoglycemia without coma: Secondary | ICD-10-CM | POA: Insufficient documentation

## 2020-03-02 DIAGNOSIS — N189 Chronic kidney disease, unspecified: Secondary | ICD-10-CM | POA: Insufficient documentation

## 2020-03-02 DIAGNOSIS — E162 Hypoglycemia, unspecified: Secondary | ICD-10-CM

## 2020-03-02 DIAGNOSIS — R930 Abnormal findings on diagnostic imaging of skull and head, not elsewhere classified: Secondary | ICD-10-CM | POA: Insufficient documentation

## 2020-03-02 DIAGNOSIS — E161 Other hypoglycemia: Secondary | ICD-10-CM | POA: Diagnosis not present

## 2020-03-02 DIAGNOSIS — Z951 Presence of aortocoronary bypass graft: Secondary | ICD-10-CM | POA: Insufficient documentation

## 2020-03-02 DIAGNOSIS — N179 Acute kidney failure, unspecified: Secondary | ICD-10-CM | POA: Diagnosis not present

## 2020-03-02 DIAGNOSIS — I10 Essential (primary) hypertension: Secondary | ICD-10-CM | POA: Diagnosis not present

## 2020-03-02 DIAGNOSIS — I130 Hypertensive heart and chronic kidney disease with heart failure and stage 1 through stage 4 chronic kidney disease, or unspecified chronic kidney disease: Secondary | ICD-10-CM | POA: Diagnosis not present

## 2020-03-02 DIAGNOSIS — Z79899 Other long term (current) drug therapy: Secondary | ICD-10-CM | POA: Insufficient documentation

## 2020-03-02 DIAGNOSIS — E1165 Type 2 diabetes mellitus with hyperglycemia: Secondary | ICD-10-CM | POA: Diagnosis not present

## 2020-03-02 DIAGNOSIS — R410 Disorientation, unspecified: Secondary | ICD-10-CM | POA: Diagnosis not present

## 2020-03-02 DIAGNOSIS — I2510 Atherosclerotic heart disease of native coronary artery without angina pectoris: Secondary | ICD-10-CM | POA: Insufficient documentation

## 2020-03-02 DIAGNOSIS — N19 Unspecified kidney failure: Secondary | ICD-10-CM

## 2020-03-02 DIAGNOSIS — Z87891 Personal history of nicotine dependence: Secondary | ICD-10-CM | POA: Insufficient documentation

## 2020-03-02 DIAGNOSIS — R29810 Facial weakness: Secondary | ICD-10-CM | POA: Diagnosis not present

## 2020-03-02 LAB — URINALYSIS, ROUTINE W REFLEX MICROSCOPIC
Bacteria, UA: NONE SEEN
Bilirubin Urine: NEGATIVE
Glucose, UA: NEGATIVE mg/dL
Ketones, ur: NEGATIVE mg/dL
Leukocytes,Ua: NEGATIVE
Nitrite: NEGATIVE
Protein, ur: 30 mg/dL — AB
Specific Gravity, Urine: 1.012 (ref 1.005–1.030)
pH: 5 (ref 5.0–8.0)

## 2020-03-02 LAB — CBC
HCT: 39.4 % (ref 39.0–52.0)
Hemoglobin: 11.7 g/dL — ABNORMAL LOW (ref 13.0–17.0)
MCH: 26.3 pg (ref 26.0–34.0)
MCHC: 29.7 g/dL — ABNORMAL LOW (ref 30.0–36.0)
MCV: 88.5 fL (ref 80.0–100.0)
Platelets: 223 10*3/uL (ref 150–400)
RBC: 4.45 MIL/uL (ref 4.22–5.81)
RDW: 17.1 % — ABNORMAL HIGH (ref 11.5–15.5)
WBC: 8.9 10*3/uL (ref 4.0–10.5)
nRBC: 0 % (ref 0.0–0.2)

## 2020-03-02 LAB — COMPREHENSIVE METABOLIC PANEL
ALT: 14 U/L (ref 0–44)
AST: 24 U/L (ref 15–41)
Albumin: 3.3 g/dL — ABNORMAL LOW (ref 3.5–5.0)
Alkaline Phosphatase: 73 U/L (ref 38–126)
Anion gap: 14 (ref 5–15)
BUN: 34 mg/dL — ABNORMAL HIGH (ref 8–23)
CO2: 18 mmol/L — ABNORMAL LOW (ref 22–32)
Calcium: 8.6 mg/dL — ABNORMAL LOW (ref 8.9–10.3)
Chloride: 107 mmol/L (ref 98–111)
Creatinine, Ser: 2.41 mg/dL — ABNORMAL HIGH (ref 0.61–1.24)
GFR calc Af Amer: 30 mL/min — ABNORMAL LOW (ref >60)
GFR calc non Af Amer: 26 mL/min — ABNORMAL LOW (ref >60)
Glucose, Bld: 84 mg/dL (ref 70–99)
Potassium: 4.6 mmol/L (ref 3.5–5.1)
Sodium: 139 mmol/L (ref 135–145)
Total Bilirubin: 0.6 mg/dL (ref 0.3–1.2)
Total Protein: 6.3 g/dL — ABNORMAL LOW (ref 6.5–8.1)

## 2020-03-02 LAB — CBG MONITORING, ED
Glucose-Capillary: 80 mg/dL (ref 70–99)
Glucose-Capillary: 150 mg/dL — ABNORMAL HIGH (ref 70–99)

## 2020-03-02 NOTE — Discharge Instructions (Addendum)
Do not take your diabetes medications until speaking with your doctor on Monday. Follow up with your doctor regarding your low blood sugar and kidney failure. Drink plenty of fluids and check your blood sugar every 2 hours until stable for 12 hours.

## 2020-03-02 NOTE — ED Triage Notes (Signed)
Pt arrived to ED via [facility] d/t family reporting pt was confused and had a facial droop (not reported with side). Pt's initial CBG was 39 and EMS reports that orange juice and crackers brought his sugar up to 56. EMS also reports that pt had no neural deficit & was A/Ox4. Pt denies using his insulin regularly, states that he uses it when he needs it, like when his vision gets blurry &/or if he gets shaky (per pt). Family also added that he has recently been showing intermittent confusion. Hx of 6 heart attacks & he is on elequis.

## 2020-03-02 NOTE — ED Provider Notes (Signed)
[facility] EMERGENCY DEPARTMENT Provider Note   CSN: [id number] Arrival date & time: 03/02/20  1242     History No chief complaint on file.   Justin Blackburn. is a 69 y.o. male.  HPI     69 yo male ho a fib/aflutter, dm, hypertension, MI presents via ems with reports of called out facial droop- no facial droop on EMS arrival but patient with hypoglycemia to 30s.  Improved and patient with no sxs now, but has not had insulin by report since yesterday.  He is on insulin, farxiga, and metformin but is unclear about last dose Wife reported to be en route, but was not at house.  She was at work.  EMS was called by patient's son.   Past Medical History:  Diagnosis Date  . A-fib ([place])    with RVR  . Acute kidney injury ([place])   . Anemia   . Angina   . Arthritis    "hands" (08/10/2017)  . Atrial flutter, paroxysmal ([place])    arrived with aflutter with RVR, EF down to 25-30%, s/p TEE DCCV on 01/29/2015  . Cancer ([place])   . CHF (congestive heart failure) ([place])   . Chronic lower back pain    "all the time from my sciatic nerve"  . Coronary artery disease   . DVT (deep venous thrombosis) ([place])   . Dyspnea   . Erectile dysfunction   . Heart failure ([place])   . Hypercholesteremia   . Hypertension   . 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 Myocardial infarction ([place]) 1988; ~4098;  ~1996;~ 2000  . Obesity   . Peripheral vascular disease ([place])   . Umbilical hernia    unrepaired    Patient Active Problem List   Diagnosis Date Noted  . AF (paroxysmal atrial fibrillation) ([place])   . Hypoxia   . Steroid-induced hyperglycemia   . Hypokalemia   . Supplemental oxygen dependent   . 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 Microcytic anemia 01/16/2018  . Acute respiratory failure with hypoxia ([place])   . History of DVT (deep vein thrombosis) 01/15/2018  . Malnutrition of moderate degree 01/15/2018  . Pressure injury of skin 01/14/2018  .  Acute pulmonary edema (HCC)   . Diabetes mellitus type 2, insulin dependent ([place])   . 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 DVT (deep venous thrombosis) ([place]) 12/07/2017  . AKI (acute kidney injury) ([place]) 12/07/2017  . Atrial fibrillation, persistent ([place]) 12/06/2017  . Postural dizziness with presyncope 12/06/2017  . Encounter for antineoplastic chemotherapy 11/11/2017  . 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 Atrial flutter ([place]) 09/01/2017  . Iron deficiency anemia   . Coronary artery disease involving coronary bypass graft of native heart without angina pectoris 08/28/2016  . 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 Anticoagulated 02/06/2015  . Demand ischemia ([place]) 01/30/2015  . 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 Umbilical hernia   . NSTEMI (non-ST elevated myocardial infarction) ([place])   . Peripheral vascular disease ([place])   . Hypercholesteremia   . Hypertension 09/01/2011  . Diabetes mellitus ([place]) 09/01/2011    Past Surgical History:  Procedure Laterality Date  . CARDIOVERSION N/A 01/29/2015   Procedure: CARDIOVERSION;  Surgeon: [name], MD;  Location: [facility];  Service: Cardiovascular;  Laterality: N/A;  . CARDIOVERSION N/A 04/26/2018   Procedure: CARDIOVERSION;  Surgeon: Johnsie Cancel,  Wallis Bamberg, MD;  Location: [facility] ENDOSCOPY;  Service: Cardiovascular;  Laterality: N/A;  . CORONARY ANGIOPLASTY WITH STENT PLACEMENT  08/31/11   "2 today; makes total of 3 stents"  . CORONARY ANGIOPLASTY WITH STENT PLACEMENT  08/10/2017   "2 today; makes a total of 5 stents" (08/10/2017)  . CORONARY ARTERY BYPASS GRAFT  1998; 2007   CABG X 4; CABG X4  . CORONARY STENT INTERVENTION N/A 08/10/2017   Procedure: CORONARY STENT INTERVENTION;  Surgeon: Sherren Mocha, MD;  Location: [facility] CV LAB;  Service: Cardiovascular;  Laterality: N/A;  . ESOPHAGOGASTRODUODENOSCOPY (EGD)  WITH PROPOFOL N/A 09/01/2017   Procedure: ESOPHAGOGASTRODUODENOSCOPY (EGD) WITH PROPOFOL;  Surgeon: Laurence Spates, MD;  Location: [facility];  Service: Endoscopy;  Laterality: N/A;  . EUS N/A 09/29/2017   Procedure: UPPER ENDOSCOPIC ULTRASOUND (EUS) LINEAR;  Surgeon: Arta Silence, MD;  Location: [facility] ENDOSCOPY;  Service: Endoscopy;  Laterality: N/A;  . LEFT HEART CATH AND CORS/GRAFTS ANGIOGRAPHY N/A 08/10/2017   Procedure: LEFT HEART CATH AND CORS/GRAFTS ANGIOGRAPHY;  Surgeon: Sherren Mocha, MD;  Location: [facility];  Service: Cardiovascular;  Laterality: N/A;  . LEFT HEART CATHETERIZATION WITH CORONARY/GRAFT ANGIOGRAM N/A 08/31/2011   Procedure: LEFT HEART CATHETERIZATION WITH Beatrix Fetters;  Surgeon: [name], MD;  [facility] CATH LAB;  Service: Cardiovascular;  Laterality: N/A;  . PERIPHERAL VASCULAR CATHETERIZATION N/A 04/27/2016   Procedure: Abdominal Aortogram w/Lower Extremity;  Surgeon: Angelia Mould, MD;  Location: [facility] LAB;  Service: Cardiovascular;  Laterality: N/A;  . TEE WITHOUT CARDIOVERSION N/A 01/29/2015   Procedure: TRANSESOPHAGEAL ECHOCARDIOGRAM (TEE);  Surgeon: [name], MD;  Location: [facility] ENDOSCOPY;  Service: Cardiovascular;  Laterality: N/A;       Family History  Problem Relation Age of Onset  . Heart disease Mother   . Heart attack Mother   . Diabetes Mother   . Heart disease Father   . Heart attack Father   . Diabetes Father   . Heart attack Brother   . Stroke Neg Hx     Social History   Tobacco Use  . Smoking status: Former Smoker    Packs/day: 2.00    Years: 6.00    Pack years: 12.00    Types: Cigarettes    Quit date: 06/16/1971    Years since quitting: 48.7  . Smokeless tobacco: Never Used  . Tobacco comment: "chewed on cigars; never really did chew tobacco"  Vaping Use  . Vaping Use: Never used  Substance Use Topics  . Alcohol use: No  . Drug use: No    Home Medications Prior to Admission  medications   Medication Sig Start Date End Date Taking? Authorizing Provider  acetaminophen (TYLENOL) 325 MG tablet Take 2 tablets (650 mg total) by mouth every 6 (six) hours as needed for mild pain or headache. 09/04/17   Erlene Quan, PA-C  amiodarone (PACERONE) 200 MG tablet TAKE 1 TABLET BY MOUTH EVERY DAY 04/04/19   Belva Crome, MD  dapagliflozin propanediol (FARXIGA) 5 MG TABS tablet Take 1 tablet (5 mg total) by mouth daily before breakfast. 12/11/19   Belva Crome, MD  ELIQUIS 5 MG TABS tablet TAKE 1 TABLET BY MOUTH TWICE A DAY 03/22/19   Belva Crome, MD  ferrous sulfate 325 (65 FE) MG tablet Take 325 mg by mouth daily. 07/20/17   [provider]  furosemide (LASIX) 40 MG tablet TAKE 1.5 TABLETS (60 MG TOTAL) BY MOUTH DAILY. 10/04/19   Belva Crome,  MD  gabapentin (NEURONTIN) 300 MG capsule Take 300 mg by mouth 2 (two) times daily.    [provider]  insulin degludec (TRESIBA FLEXTOUCH) 100 UNIT/ML SOPN FlexTouch Pen Inject 60 Units into the skin at bedtime.     [provider]  KLOR-CON M20 20 MEQ tablet TAKE 1 TABLET BY MOUTH EVERY DAY 02/24/19   Belva Crome, MD  losartan (COZAAR) 100 MG tablet TAKE 1 TABLET BY MOUTH EVERY DAY 07/24/19   Belva Crome, MD  metFORMIN (GLUCOPHAGE) 1000 MG tablet Take 1,000 mg by mouth 2 (two) times daily with a meal. 05/19/18   [provider]  nitroGLYCERIN (NITROSTAT) 0.4 MG SL tablet PLACE 1 TABLET UNDER THE TONGUE EVERY 5 (FIVE) MINUTES AS NEEDED FOR CHEST PAIN (ANGINA). 01/23/20   Belva Crome, MD  pantoprazole (PROTONIX) 40 MG tablet TAKE 1 TABLET BY MOUTH EVERY DAY 10/23/19   Belva Crome, MD  rosuvastatin (CRESTOR) 40 MG tablet Take 1 tablet (40 mg total) by mouth daily. 04/20/19 07/19/19  Belva Crome, MD    Allergies    Patient has no known allergies.  Review of Systems   Review of Systems  All other systems reviewed and are negative.   Physical Exam Updated Vital Signs There were no vitals  taken for this visit.  Physical Exam Vitals and nursing note reviewed.  Constitutional:      General: He is not in acute distress.    Appearance: Normal appearance. He is obese. He is not ill-appearing.  HENT:     Head: Normocephalic.     Right Ear: External ear normal.     Left Ear: External ear normal.     Nose: Nose normal.     Mouth/Throat:     Mouth: Mucous membranes are moist.  Eyes:     Extraocular Movements: Extraocular movements intact.     Pupils: Pupils are equal, round, and reactive to light.  Cardiovascular:     Rate and Rhythm: Normal rate and regular rhythm.  Pulmonary:     Effort: Pulmonary effort is normal.     Breath sounds: Normal breath sounds.  Abdominal:     General: Abdomen is flat.     Palpations: Abdomen is soft.  Musculoskeletal:        General: Normal range of motion.     Cervical back: Normal range of motion.  Skin:    General: Skin is warm and dry.     Capillary Refill: Capillary refill takes less than 2 seconds.  Neurological:     General: No focal deficit present.     Mental Status: He is alert.  Psychiatric:        Mood and Affect: Mood normal.     ED Results / Procedures / Treatments   Labs (all labs ordered are listed, but only abnormal results are displayed) Labs Reviewed - No data to display  EKG EKG Interpretation  Date/Time:  Saturday March 02 2020 14:21:49 EDT Ventricular Rate:  50 PR Interval:  144 QRS Duration: 132 QT Interval:  536 QTC Calculation: 488 R Axis:   -77 Text Interpretation: Sinus bradycardia Left axis deviation Non-specific intra-ventricular conduction block Inferior infarct , age undetermined Cannot rule out Anteroseptal infarct , age undetermined Abnormal ECG Confirmed by Pattricia Boss [id number]) on 03/02/2020 3:10:11 PM   Radiology CT Head Wo Contrast  Result Date: 03/02/2020 CLINICAL DATA:  Intermittent confusion EXAM: CT HEAD WITHOUT CONTRAST TECHNIQUE: Contiguous axial images were obtained from  the  base of the skull through the vertex without intravenous contrast. COMPARISON:  None. FINDINGS: Brain: No evidence of acute infarction, hemorrhage, hydrocephalus, extra-axial collection or mass lesion/mass effect. Periventricular white matter hypodensities consistent with sequela of chronic microvascular ischemic disease. Advanced global parenchymal volume loss for age. Vascular: Diffuse vascular calcifications. Skull: Normal. Negative for fracture or focal lesion. Sinuses/Orbits: Near complete opacification of the LEFT sphenoid sinus. Severe mucosal thickening of the RIGHT maxillary sinus. Mild mucosal thickening of the LEFT maxillary sinus. Small amount of frothy fluid within the LEFT maxillary sinus. Scattered mucosal thickening throughout the frontal sinuses and ethmoid air cells. Status post cataract surgery. Other: None. IMPRESSION: 1.  No acute intracranial abnormality. 2. Pansinus disease with a small amount of frothy fluid in the LEFT maxillary sinus which could reflect acute sinusitis in the appropriate clinical setting. Electronically Signed   By: Valentino Saxon MD   On: 03/02/2020 13:30    Procedures Procedures (including critical care time)  Medications Ordered in ED Medications - No data to display  ED Course  I have reviewed the triage vital signs and the nursing notes.  Pertinent labs & imaging results that were available during my care of the patient were reviewed by me and considered in my medical decision making (see chart for details).    MDM Rules/Calculators/A&P                           69 yo male ho dm presents today with hypoglycemia.  BS initially 30s.  Patient taking po here.  Verifying meds, but current plan is to hold tomorrow and recheck with pmd on Monday as patient is not totally clear on when he took what medication. Of note in labs, creatinine is elevated but stable from first prior of 10/05/19- plan follow up and recheck.    Final Clinical  Impression(s) / ED Diagnoses Final diagnoses:  Hypoglycemia  Renal failure, unspecified chronicity    Rx / DC Orders ED Discharge Orders    None       Pattricia Boss, MD 03/02/20 [phone]

## 2020-03-07 DIAGNOSIS — N289 Disorder of kidney and ureter, unspecified: Secondary | ICD-10-CM | POA: Diagnosis not present

## 2020-03-07 DIAGNOSIS — Z6828 Body mass index (BMI) 28.0-28.9, adult: Secondary | ICD-10-CM | POA: Diagnosis not present

## 2020-03-07 DIAGNOSIS — I5042 Chronic combined systolic (congestive) and diastolic (congestive) heart failure: Secondary | ICD-10-CM | POA: Diagnosis not present

## 2020-03-07 DIAGNOSIS — I10 Essential (primary) hypertension: Secondary | ICD-10-CM | POA: Diagnosis not present

## 2020-03-07 DIAGNOSIS — E11649 Type 2 diabetes mellitus with hypoglycemia without coma: Secondary | ICD-10-CM | POA: Diagnosis not present

## 2020-03-09 DIAGNOSIS — N184 Chronic kidney disease, stage 4 (severe): Secondary | ICD-10-CM | POA: Insufficient documentation

## 2020-03-09 NOTE — Progress Notes (Signed)
Cardiology Office Note:    Date:  03/14/2020   ID:  Justin Kida., DOB [birth year], MRN [id number]  PCP:  [name], PA-C  Cardiologist:  [name], MD   Referring MD: [name], PA-C   Chief Complaint  Patient presents with  . Coronary Artery Disease  . Congestive Heart Failure  . Atrial Fibrillation  . Advice Only    CKD #4    History of Present Illness:    Justin Papillion. is a 69 y.o. male with a hx of CAD, CABG x2, recent left main and circumflex DES, upper GI bleed related to previously undiagnosed esophageal cancer, chemotherapy, paroxysmal atrial fibrillation, anticoagulation therapy, acute on chronic systolic and diastolic heart failure related to atrial fibrillation, returning after recent admission for GI bleed.  Had a significant episode of hypoglycemia that required an emergency room visit on September 18.  Wilder Glade was discontinued, insulin was decreased by 50%, and furosemide was discontinued as well.  Despite no diuretic therapy for 10 days, shortness of breath is not been a big issue.  He does not monitor his caloric intake, and does not monitor blood sugars at home.  Past Medical History:  Diagnosis Date  . A-fib ([place])    with RVR  . Acute kidney injury ([place])   . Anemia   . Angina   . Arthritis    "hands" (08/10/2017)  . Atrial flutter, paroxysmal ([place])    arrived with aflutter with RVR, EF down to 25-30%, s/p TEE DCCV on 01/29/2015  . Cancer ([place])   . CHF (congestive heart failure) ([place])   . Chronic lower back pain    "all the time from my sciatic nerve"  . Coronary artery disease   . DVT (deep venous thrombosis) ([place])   . Dyspnea   . Erectile dysfunction   . Heart failure ([place])   . Hypercholesteremia   . Hypertension   . 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 Myocardial infarction ([place]) 1988; ~3329;  ~1996;~ 2000  . Obesity   . Peripheral vascular disease ([place])   . Umbilical hernia    unrepaired    Past  Surgical History:  Procedure Laterality Date  . CARDIOVERSION N/A 01/29/2015   Procedure: CARDIOVERSION;  Surgeon: [name], MD;  Location: [facility];  Service: Cardiovascular;  Laterality: N/A;  . CARDIOVERSION N/A 04/26/2018   Procedure: CARDIOVERSION;  Surgeon: Josue Hector, MD;  Location: [facility] ENDOSCOPY;  Service: Cardiovascular;  Laterality: N/A;  . CORONARY ANGIOPLASTY WITH STENT PLACEMENT  08/31/11   "2 today; makes total of 3 stents"  . CORONARY ANGIOPLASTY WITH STENT PLACEMENT  08/10/2017   "2 today; makes a total of 5 stents" (08/10/2017)  . CORONARY ARTERY BYPASS GRAFT  1998; 2007   CABG X 4; CABG X4  . CORONARY STENT INTERVENTION N/A 08/10/2017   Procedure: CORONARY STENT INTERVENTION;  Surgeon: Sherren Mocha, MD;  Location: [place] CV LAB;  Service: Cardiovascular;  Laterality: N/A;  . ESOPHAGOGASTRODUODENOSCOPY (EGD) WITH PROPOFOL N/A 09/01/2017   Procedure: ESOPHAGOGASTRODUODENOSCOPY (EGD) WITH PROPOFOL;  Surgeon: Laurence Spates, MD;  Location: [place];  Service: Endoscopy;  Laterality: N/A;  . EUS N/A 09/29/2017   Procedure: UPPER ENDOSCOPIC ULTRASOUND (EUS) LINEAR;  Surgeon: Arta Silence, MD;  Location: [facility] ENDOSCOPY;  Service: Endoscopy;  Laterality: N/A;  . LEFT HEART CATH AND CORS/GRAFTS ANGIOGRAPHY N/A 08/10/2017   Procedure: LEFT HEART CATH AND CORS/GRAFTS ANGIOGRAPHY;  Surgeon: Sherren Mocha, MD;  Location: [place]  CV LAB;  Service: Cardiovascular;  Laterality: N/A;  . LEFT HEART CATHETERIZATION WITH CORONARY/GRAFT ANGIOGRAM N/A 08/31/2011   Procedure: LEFT HEART CATHETERIZATION WITH Beatrix Fetters;  Surgeon: [name], MD;  Location: [facility] CATH LAB;  Service: Cardiovascular;  Laterality: N/A;  . PERIPHERAL VASCULAR CATHETERIZATION N/A 04/27/2016   Procedure: Abdominal Aortogram w/Lower Extremity;  Surgeon: Angelia Mould, MD;  Location: [place] CV LAB;  Service: Cardiovascular;  Laterality: N/A;  . TEE WITHOUT  CARDIOVERSION N/A 01/29/2015   Procedure: TRANSESOPHAGEAL ECHOCARDIOGRAM (TEE);  Surgeon: [name], MD;  Location: [facility] ENDOSCOPY;  Service: Cardiovascular;  Laterality: N/A;    Current Medications: Current Meds  Medication Sig  . acetaminophen (TYLENOL) 500 MG tablet Take 1,000 mg by mouth every 6 (six) hours as needed for headache (pain).  [name] amiodarone (PACERONE) 200 MG tablet TAKE 1 TABLET BY MOUTH EVERY DAY  . ELIQUIS 5 MG TABS tablet TAKE 1 TABLET BY MOUTH TWICE A DAY  . gabapentin (NEURONTIN) 300 MG capsule Take 300 mg by mouth 2 (two) times daily.  . insulin degludec (TRESIBA FLEXTOUCH) 100 UNIT/ML SOPN FlexTouch Pen Inject 50 Units into the skin daily as needed (blurry vision).   . metFORMIN (GLUCOPHAGE) 1000 MG tablet Take 1,000 mg by mouth 2 (two) times daily with a meal.  . nitroGLYCERIN (NITROSTAT) 0.4 MG SL tablet PLACE 1 TABLET UNDER THE TONGUE EVERY 5 (FIVE) MINUTES AS NEEDED FOR CHEST PAIN (ANGINA).  [name] pantoprazole (PROTONIX) 40 MG tablet TAKE 1 TABLET BY MOUTH EVERY DAY  . rosuvastatin (CRESTOR) 40 MG tablet Take 1 tablet (40 mg total) by mouth daily.  . [DISCONTINUED] KLOR-CON M20 20 MEQ tablet TAKE 1 TABLET BY MOUTH EVERY DAY  . [DISCONTINUED] losartan (COZAAR) 100 MG tablet TAKE 1 TABLET BY MOUTH EVERY DAY     Allergies:   Patient has no known allergies.   Social History   Socioeconomic History  . Marital status: Married    Spouse name: Not on file  . Number of children: Not on file  . Years of education: Not on file  . Highest education level: Not on file  Occupational History  . Not on file  Tobacco Use  . Smoking status: Former Smoker    Packs/day: 2.00    Years: 6.00    Pack years: 12.00    Types: Cigarettes    Quit date: 06/16/1971    Years since quitting: 48.7  . Smokeless tobacco: Never Used  . Tobacco comment: "chewed on cigars; never really did chew tobacco"  Vaping Use  . Vaping Use: Never used  Substance and Sexual Activity  . Alcohol  use: No  . Drug use: No  . Sexual activity: Never  Other Topics Concern  . Not on file  Social History Narrative  . Not on file   Social Determinants of Health   Financial Resource Strain:   . Difficulty of Paying Living Expenses: Not on file  Food Insecurity:   . Worried About Charity fundraiser in the Last Year: Not on file  . Ran Out of Food in the Last Year: Not on file  Transportation Needs:   . Lack of Transportation (Medical): Not on file  . Lack of Transportation (Non-Medical): Not on file  Physical Activity:   . Days of Exercise per Week: Not on file  . Minutes of Exercise per Session: Not on file  Stress:   . Feeling of Stress : Not on file  Social Connections:   .  Frequency of Communication with Friends and Family: Not on file  . Frequency of Social Gatherings with Friends and Family: Not on file  . Attends Religious Services: Not on file  . Active Member of Clubs or Organizations: Not on file  . Attends Archivist Meetings: Not on file  . Marital Status: Not on file     Family History: The patient's family history includes Diabetes in his father and mother; Heart attack in his brother, father, and mother; Heart disease in his father and mother. There is no history of Stroke.  ROS:   Please see the history of present illness.    COVID-19 vaccine is now being taken.  He has gotten his first shot.  All other systems reviewed and are negative.  EKGs/Labs/Other Studies Reviewed:    The following studies were reviewed today: No new data  EKG:  EKG not repeated  Recent Labs: 03/02/2020: ALT 14; BUN 34; Creatinine, Ser 2.41; Hemoglobin 11.7; Platelets 223; Potassium 4.6; Sodium 139  Recent Lipid Panel    Component Value Date/Time   CHOL 149 12/07/2017 0336   CHOL 129 09/21/2017 1352   TRIG 98 12/07/2017 0336   HDL 33 (L) 12/07/2017 0336   HDL 31 (L) 09/21/2017 1352   CHOLHDL 4.5 12/07/2017 0336   VLDL 20 12/07/2017 0336   LDLCALC 96 12/07/2017  0336   LDLCALC 70 09/21/2017 1352    Physical Exam:    VS:  BP (!) 164/82   Pulse 90   Ht 6' (1.829 m)   Wt 208 lb 8 oz (94.6 kg)   SpO2 97%   BMI 28.28 kg/m     Wt Readings from Last 3 Encounters:  03/14/20 208 lb 8 oz (94.6 kg)  12/11/19 209 lb (94.8 kg)  10/05/19 210 lb 9.6 oz (95.5 kg)     GEN: Older than stated age in appearance abdominal obesity.. No acute distress HEENT: Normal NECK: No JVD. LYMPHATICS: No lymphadenopathy CARDIAC:  RRR without murmur, gallop, or edema. VASCULAR:  Normal Pulses. No bruits. RESPIRATORY:  Clear to auscultation without rales, wheezing or rhonchi  ABDOMEN: Soft, non-tender, non-distended, No pulsatile mass, MUSCULOSKELETAL: No deformity  SKIN: Warm and dry NEUROLOGIC:  Alert and oriented x 3 PSYCHIATRIC:  Normal affect   ASSESSMENT:    1. Chronic combined systolic and diastolic heart failure, NYHA class 2 (HCC)   2. AF (paroxysmal atrial fibrillation) ([place])   3. Coronary artery disease involving coronary bypass graft of native heart with angina pectoris ([place])   4. Type 2 diabetes mellitus with other circulatory complication, with long-term current use of insulin ([place])   5. H/O amiodarone therapy   6. Essential hypertension   7. CKD stage 3 due to type 2 diabetes mellitus ([place])   8. Educated about COVID-19 virus infection   9. CKD (chronic kidney disease), stage IV (HCC)    PLAN:    In order of problems listed above:  1. All in all, heart failure therapy has essentially been abandoned.  Not on beta-blocker therapy because of bradycardia and requirement of amiodarone.  Wilder Glade was discontinued when he had hypoglycemia..  Furosemide has been discontinued because of reasons that are not clear but possibly kidney function.  He is on losartan 50 mg daily.  We will plan to reinstitute [place].  We will talk to [name] concerning approach. 2. Continue amiodarone 3. Not having angina.  Continue high intensity statin therapy. 4. I  would be in favor of decreasing or discontinuing  Metformin given kidney function and reinstituting Farxiga at a dose of 10 mg/day.  We will discuss this with Dr. Tobie Lords. 5. Liver function and thyroid studies were normal when last checked this month. 6. Blood pressure is elevated.  Based upon today's basic metabolic panel, we will make a decision about additional therapy for blood pressure.  Just resuming the Wilder Glade will help to lower blood pressure because of diuresis.  Will probably not be able to use MRA therapy because of kidney function. 7. Kidney protection will be provided by [place] but not the other medications being used to lower the blood sugar. 8. Finally decided to get vaccinated for COVID-19.  Guideline directed therapy for left ventricular systolic dysfunction: Angiotensin receptor-neprilysin inhibitor (ARNI)-Entresto; beta-blocker therapy - carvedilol, metoprolol succinate, or bisoprolol; mineralocorticoid receptor antagonist (MRA) therapy -spironolactone or eplerenone.  SGLT-2 agents -  Dapagliflozin Wilder Glade) or Empagliflozin ([place]).These therapies have been shown to improve clinical outcomes including reduction of rehospitalization, survival, and acute heart failure.    Medication Adjustments/Labs and Tests Ordered: Current medicines are reviewed at length with the patient today.  Concerns regarding medicines are outlined above.  Orders Placed This Encounter  Procedures  . Basic metabolic panel   Meds ordered this encounter  Medications  . losartan (COZAAR) 50 MG tablet    Sig: Take 1 tablet (50 mg total) by mouth daily.    Dispense:  90 tablet    Refill:  3    Dose change    Patient Instructions  Medication Instructions:  Your physician recommends that you continue on your current medications as directed. Please refer to the Current Medication list given to you today.  *If you need a refill on your cardiac medications before your next appointment, please call  your pharmacy*   Lab Work: BMET today  If you have labs (blood work) drawn today and your tests are completely normal, you will receive your results only by: [name] MyChart Message (if you have MyChart) OR . A paper copy in the mail If you have any lab test that is abnormal or we need to change your treatment, we will call you to review the results.   Testing/Procedures: None   Follow-Up: At [facility], you and your health needs are our priority.  As part of our continuing mission to provide you with exceptional heart care, we have created designated Provider Care Teams.  These Care Teams include your primary Cardiologist (physician) and Advanced Practice Providers (APPs -  Physician Assistants and Nurse Practitioners) who all work together to provide you with the care you need, when you need it.  We recommend signing up for the patient portal called "MyChart".  Sign up information is provided on this After Visit Summary.  MyChart is used to connect with patients for Virtual Visits (Telemedicine).  Patients are able to view lab/test results, encounter notes, upcoming appointments, etc.  Non-urgent messages can be sent to your provider as well.   To learn more about what you can do with MyChart, go to [email or web address].    Your next appointment:   3 month(s)  The format for your next appointment:   In Person  Provider:   You may see [name], MD or one of the following Advanced Practice Providers on your designated Care Team:    Truitt Merle, NP  Cecilie Kicks, NP  Kathyrn Drown, NP    Other Instructions      Signed, [name], MD  03/14/2020 9:35 AM  [facility]

## 2020-03-14 ENCOUNTER — Other Ambulatory Visit: Payer: Self-pay

## 2020-03-14 ENCOUNTER — Ambulatory Visit (INDEPENDENT_AMBULATORY_CARE_PROVIDER_SITE_OTHER): Payer: Medicare HMO | Admitting: Interventional Cardiology

## 2020-03-14 ENCOUNTER — Encounter: Payer: Self-pay | Admitting: Interventional Cardiology

## 2020-03-14 VITALS — BP 164/82 | HR 90 | Ht 72.0 in | Wt 208.5 lb

## 2020-03-14 DIAGNOSIS — Z9229 Personal history of other drug therapy: Secondary | ICD-10-CM | POA: Diagnosis not present

## 2020-03-14 DIAGNOSIS — I480 Paroxysmal atrial fibrillation: Secondary | ICD-10-CM | POA: Diagnosis not present

## 2020-03-14 DIAGNOSIS — Z7189 Other specified counseling: Secondary | ICD-10-CM | POA: Diagnosis not present

## 2020-03-14 DIAGNOSIS — I5042 Chronic combined systolic (congestive) and diastolic (congestive) heart failure: Secondary | ICD-10-CM

## 2020-03-14 DIAGNOSIS — E1159 Type 2 diabetes mellitus with other circulatory complications: Secondary | ICD-10-CM

## 2020-03-14 DIAGNOSIS — N184 Chronic kidney disease, stage 4 (severe): Secondary | ICD-10-CM

## 2020-03-14 DIAGNOSIS — Z794 Long term (current) use of insulin: Secondary | ICD-10-CM

## 2020-03-14 DIAGNOSIS — I10 Essential (primary) hypertension: Secondary | ICD-10-CM

## 2020-03-14 DIAGNOSIS — E1122 Type 2 diabetes mellitus with diabetic chronic kidney disease: Secondary | ICD-10-CM | POA: Diagnosis not present

## 2020-03-14 DIAGNOSIS — N183 Chronic kidney disease, stage 3 unspecified: Secondary | ICD-10-CM

## 2020-03-14 DIAGNOSIS — I25709 Atherosclerosis of coronary artery bypass graft(s), unspecified, with unspecified angina pectoris: Secondary | ICD-10-CM | POA: Diagnosis not present

## 2020-03-14 LAB — BASIC METABOLIC PANEL
BUN/Creatinine Ratio: 11 (ref 10–24)
BUN: 33 mg/dL — ABNORMAL HIGH (ref 8–27)
CO2: 24 mmol/L (ref 20–29)
Calcium: 9.6 mg/dL (ref 8.6–10.2)
Chloride: 105 mmol/L (ref 96–106)
Creatinine, Ser: 3.11 mg/dL — ABNORMAL HIGH (ref 0.76–1.27)
GFR calc Af Amer: 22 mL/min/{1.73_m2} — ABNORMAL LOW (ref >59)
GFR calc non Af Amer: 19 mL/min/{1.73_m2} — ABNORMAL LOW (ref >59)
Glucose: 89 mg/dL (ref 65–99)
Potassium: 4.6 mmol/L (ref 3.5–5.2)
Sodium: 142 mmol/L (ref 134–144)

## 2020-03-14 MED ORDER — LOSARTAN POTASSIUM 50 MG PO TABS: 50.0000 mg | ORAL_TABLET | Freq: Every day | ORAL | 3 refills | Status: AC

## 2020-03-14 NOTE — Patient Instructions (Signed)
Medication Instructions:  Your physician recommends that you continue on your current medications as directed. Please refer to the Current Medication list given to you today.  *If you need a refill on your cardiac medications before your next appointment, please call your pharmacy*   Lab Work: BMET today  If you have labs (blood work) drawn today and your tests are completely normal, you will receive your results only by: [name] MyChart Message (if you have MyChart) OR . A paper copy in the mail If you have any lab test that is abnormal or we need to change your treatment, we will call you to review the results.   Testing/Procedures: None   Follow-Up: At [facility], you and your health needs are our priority.  As part of our continuing mission to provide you with exceptional heart care, we have created designated Provider Care Teams.  These Care Teams include your primary Cardiologist (physician) and Advanced Practice Providers (APPs -  Physician Assistants and Nurse Practitioners) who all work together to provide you with the care you need, when you need it.  We recommend signing up for the patient portal called "MyChart".  Sign up information is provided on this After Visit Summary.  MyChart is used to connect with patients for Virtual Visits (Telemedicine).  Patients are able to view lab/test results, encounter notes, upcoming appointments, etc.  Non-urgent messages can be sent to your provider as well.   To learn more about what you can do with MyChart, go to [email or web address].    Your next appointment:   3 month(s)  The format for your next appointment:   In Person  Provider:   You may see [name], MD or one of the following Advanced Practice Providers on your designated Care Team:    Truitt Merle, NP  Cecilie Kicks, NP  Kathyrn Drown, NP    Other Instructions

## 2020-03-18 ENCOUNTER — Other Ambulatory Visit: Payer: Self-pay | Admitting: *Deleted

## 2020-03-18 ENCOUNTER — Telehealth: Payer: Self-pay | Admitting: Interventional Cardiology

## 2020-03-18 DIAGNOSIS — N184 Chronic kidney disease, stage 4 (severe): Secondary | ICD-10-CM

## 2020-03-18 NOTE — Telephone Encounter (Signed)
Justin Blackburn, the pt's wife is calling stating [name] forgot the name of the medications [name] advised him to stop taking. She is requesting a call to clarify. Please advise.

## 2020-03-18 NOTE — Telephone Encounter (Signed)
Loren Racer, RN  03/18/2020 12:22 PM EDT Back to Top    Belva Crome, MD Loren Racer, RN Kidney function is worsening. No Farxiga. No furosemide. Close f/u by Kidney specialist.   Spoke with pt and reviewed results and recommendations. Pt does not have a kidney doctor. Advised I will place referral. Pt appreciative for call.    Spoke with the pts wife Mardene Celeste, and went over with her recommendations for medications to hold based on worsening renal function, per Dr. Tamala Julian.  Wife verbalized understanding and agrees with this plan.  Wife was gracious for all the assistance provided.

## 2020-03-20 ENCOUNTER — Other Ambulatory Visit: Payer: Self-pay | Admitting: Interventional Cardiology

## 2020-03-21 ENCOUNTER — Telehealth: Payer: Self-pay | Admitting: Oncology

## 2020-03-21 NOTE — Telephone Encounter (Signed)
Rescheduled 10/22 appointment to 11/04 per provider on call, patient has been called and notified.

## 2020-03-29 ENCOUNTER — Telehealth: Payer: Self-pay | Admitting: Oncology

## 2020-03-29 NOTE — Telephone Encounter (Signed)
Called pt per 10/15 sch msg - no answer. Left message for patein to call back to reschedule appt.

## 2020-04-01 ENCOUNTER — Ambulatory Visit
Admission: RE | Admit: 2020-04-01 | Discharge: 2020-04-01 | Disposition: A | Discharge status: Home / Self Care | Payer: Medicare HMO | Source: Ambulatory Visit | Attending: Oncology | Admitting: Oncology

## 2020-04-01 ENCOUNTER — Other Ambulatory Visit: Payer: Self-pay

## 2020-04-01 DIAGNOSIS — C159 Malignant neoplasm of esophagus, unspecified: Secondary | ICD-10-CM | POA: Diagnosis not present

## 2020-04-01 DIAGNOSIS — I700 Atherosclerosis of aorta: Secondary | ICD-10-CM | POA: Diagnosis not present

## 2020-04-05 ENCOUNTER — Ambulatory Visit: Payer: Medicare HMO | Admitting: Oncology

## 2020-04-05 ENCOUNTER — Other Ambulatory Visit: Payer: Medicare HMO

## 2020-04-14 ENCOUNTER — Emergency Department (HOSPITAL_COMMUNITY): Payer: Medicare HMO

## 2020-04-14 ENCOUNTER — Other Ambulatory Visit: Payer: Self-pay

## 2020-04-14 ENCOUNTER — Inpatient Hospital Stay (HOSPITAL_COMMUNITY)
Admission: EM | Admit: 2020-04-14 | Discharge: 2020-05-15 | DRG: 981 | Disposition: E | Payer: Medicare HMO | Attending: Internal Medicine | Admitting: Internal Medicine

## 2020-04-14 ENCOUNTER — Encounter (HOSPITAL_COMMUNITY): Payer: Self-pay

## 2020-04-14 DIAGNOSIS — E870 Hyperosmolality and hypernatremia: Secondary | ICD-10-CM | POA: Diagnosis not present

## 2020-04-14 DIAGNOSIS — Z7902 Long term (current) use of antithrombotics/antiplatelets: Secondary | ICD-10-CM

## 2020-04-14 DIAGNOSIS — Y838 Other surgical procedures as the cause of abnormal reaction of the patient, or of later complication, without mention of misadventure at the time of the procedure: Secondary | ICD-10-CM | POA: Diagnosis not present

## 2020-04-14 DIAGNOSIS — E872 Acidosis: Secondary | ICD-10-CM | POA: Diagnosis not present

## 2020-04-14 DIAGNOSIS — J9621 Acute and chronic respiratory failure with hypoxia: Secondary | ICD-10-CM | POA: Diagnosis present

## 2020-04-14 DIAGNOSIS — R34 Anuria and oliguria: Secondary | ICD-10-CM | POA: Diagnosis not present

## 2020-04-14 DIAGNOSIS — R0602 Shortness of breath: Secondary | ICD-10-CM | POA: Diagnosis not present

## 2020-04-14 DIAGNOSIS — I5042 Chronic combined systolic (congestive) and diastolic (congestive) heart failure: Secondary | ICD-10-CM | POA: Diagnosis present

## 2020-04-14 DIAGNOSIS — Z86718 Personal history of other venous thrombosis and embolism: Secondary | ICD-10-CM

## 2020-04-14 DIAGNOSIS — R6521 Severe sepsis with septic shock: Secondary | ICD-10-CM | POA: Diagnosis not present

## 2020-04-14 DIAGNOSIS — Z923 Personal history of irradiation: Secondary | ICD-10-CM

## 2020-04-14 DIAGNOSIS — N170 Acute kidney failure with tubular necrosis: Secondary | ICD-10-CM | POA: Diagnosis not present

## 2020-04-14 DIAGNOSIS — I4821 Permanent atrial fibrillation: Secondary | ICD-10-CM | POA: Diagnosis present

## 2020-04-14 DIAGNOSIS — R4182 Altered mental status, unspecified: Secondary | ICD-10-CM | POA: Diagnosis not present

## 2020-04-14 DIAGNOSIS — I63431 Cerebral infarction due to embolism of right posterior cerebral artery: Secondary | ICD-10-CM | POA: Diagnosis not present

## 2020-04-14 DIAGNOSIS — H5340 Unspecified visual field defects: Secondary | ICD-10-CM | POA: Diagnosis not present

## 2020-04-14 DIAGNOSIS — G8194 Hemiplegia, unspecified affecting left nondominant side: Secondary | ICD-10-CM | POA: Diagnosis not present

## 2020-04-14 DIAGNOSIS — T508X5A Adverse effect of diagnostic agents, initial encounter: Secondary | ICD-10-CM | POA: Diagnosis not present

## 2020-04-14 DIAGNOSIS — Z66 Do not resuscitate: Secondary | ICD-10-CM | POA: Diagnosis not present

## 2020-04-14 DIAGNOSIS — J9600 Acute respiratory failure, unspecified whether with hypoxia or hypercapnia: Secondary | ICD-10-CM | POA: Diagnosis present

## 2020-04-14 DIAGNOSIS — E1151 Type 2 diabetes mellitus with diabetic peripheral angiopathy without gangrene: Secondary | ICD-10-CM | POA: Diagnosis present

## 2020-04-14 DIAGNOSIS — Y753 Surgical instruments, materials and neurological devices (including sutures) associated with adverse incidents: Secondary | ICD-10-CM | POA: Diagnosis not present

## 2020-04-14 DIAGNOSIS — J690 Pneumonitis due to inhalation of food and vomit: Secondary | ICD-10-CM | POA: Diagnosis not present

## 2020-04-14 DIAGNOSIS — J9602 Acute respiratory failure with hypercapnia: Secondary | ICD-10-CM | POA: Diagnosis not present

## 2020-04-14 DIAGNOSIS — Z7189 Other specified counseling: Secondary | ICD-10-CM

## 2020-04-14 DIAGNOSIS — I509 Heart failure, unspecified: Secondary | ICD-10-CM | POA: Diagnosis not present

## 2020-04-14 DIAGNOSIS — Y92230 Patient room in hospital as the place of occurrence of the external cause: Secondary | ICD-10-CM | POA: Diagnosis not present

## 2020-04-14 DIAGNOSIS — R0902 Hypoxemia: Secondary | ICD-10-CM | POA: Diagnosis not present

## 2020-04-14 DIAGNOSIS — U071 COVID-19: Secondary | ICD-10-CM | POA: Diagnosis not present

## 2020-04-14 DIAGNOSIS — I63421 Cerebral infarction due to embolism of right anterior cerebral artery: Secondary | ICD-10-CM | POA: Diagnosis present

## 2020-04-14 DIAGNOSIS — T380X5A Adverse effect of glucocorticoids and synthetic analogues, initial encounter: Secondary | ICD-10-CM | POA: Diagnosis present

## 2020-04-14 DIAGNOSIS — J95821 Acute postprocedural respiratory failure: Secondary | ICD-10-CM | POA: Diagnosis not present

## 2020-04-14 DIAGNOSIS — Z4682 Encounter for fitting and adjustment of non-vascular catheter: Secondary | ICD-10-CM | POA: Diagnosis not present

## 2020-04-14 DIAGNOSIS — J849 Interstitial pulmonary disease, unspecified: Secondary | ICD-10-CM | POA: Diagnosis present

## 2020-04-14 DIAGNOSIS — J8410 Pulmonary fibrosis, unspecified: Secondary | ICD-10-CM | POA: Diagnosis present

## 2020-04-14 DIAGNOSIS — D631 Anemia in chronic kidney disease: Secondary | ICD-10-CM | POA: Diagnosis present

## 2020-04-14 DIAGNOSIS — I5043 Acute on chronic combined systolic (congestive) and diastolic (congestive) heart failure: Secondary | ICD-10-CM | POA: Diagnosis present

## 2020-04-14 DIAGNOSIS — Z87891 Personal history of nicotine dependence: Secondary | ICD-10-CM

## 2020-04-14 DIAGNOSIS — R1310 Dysphagia, unspecified: Secondary | ICD-10-CM | POA: Diagnosis not present

## 2020-04-14 DIAGNOSIS — Z7984 Long term (current) use of oral hypoglycemic drugs: Secondary | ICD-10-CM

## 2020-04-14 DIAGNOSIS — R7989 Other specified abnormal findings of blood chemistry: Secondary | ICD-10-CM | POA: Diagnosis not present

## 2020-04-14 DIAGNOSIS — J9601 Acute respiratory failure with hypoxia: Secondary | ICD-10-CM

## 2020-04-14 DIAGNOSIS — I6621 Occlusion and stenosis of right posterior cerebral artery: Secondary | ICD-10-CM | POA: Diagnosis present

## 2020-04-14 DIAGNOSIS — Z452 Encounter for adjustment and management of vascular access device: Secondary | ICD-10-CM | POA: Diagnosis not present

## 2020-04-14 DIAGNOSIS — Z8501 Personal history of malignant neoplasm of esophagus: Secondary | ICD-10-CM

## 2020-04-14 DIAGNOSIS — Z794 Long term (current) use of insulin: Secondary | ICD-10-CM

## 2020-04-14 DIAGNOSIS — J1282 Pneumonia due to coronavirus disease 2019: Secondary | ICD-10-CM | POA: Diagnosis present

## 2020-04-14 DIAGNOSIS — A419 Sepsis, unspecified organism: Secondary | ICD-10-CM | POA: Diagnosis not present

## 2020-04-14 DIAGNOSIS — E119 Type 2 diabetes mellitus without complications: Secondary | ICD-10-CM | POA: Diagnosis not present

## 2020-04-14 DIAGNOSIS — J9622 Acute and chronic respiratory failure with hypercapnia: Secondary | ICD-10-CM | POA: Diagnosis present

## 2020-04-14 DIAGNOSIS — I2510 Atherosclerotic heart disease of native coronary artery without angina pectoris: Secondary | ICD-10-CM | POA: Diagnosis present

## 2020-04-14 DIAGNOSIS — I130 Hypertensive heart and chronic kidney disease with heart failure and stage 1 through stage 4 chronic kidney disease, or unspecified chronic kidney disease: Secondary | ICD-10-CM | POA: Diagnosis present

## 2020-04-14 DIAGNOSIS — I6603 Occlusion and stenosis of bilateral middle cerebral arteries: Secondary | ICD-10-CM | POA: Diagnosis not present

## 2020-04-14 DIAGNOSIS — R29737 NIHSS score 37: Secondary | ICD-10-CM | POA: Diagnosis not present

## 2020-04-14 DIAGNOSIS — I739 Peripheral vascular disease, unspecified: Secondary | ICD-10-CM | POA: Diagnosis present

## 2020-04-14 DIAGNOSIS — Z9221 Personal history of antineoplastic chemotherapy: Secondary | ICD-10-CM

## 2020-04-14 DIAGNOSIS — I639 Cerebral infarction, unspecified: Secondary | ICD-10-CM | POA: Diagnosis not present

## 2020-04-14 DIAGNOSIS — I4892 Unspecified atrial flutter: Secondary | ICD-10-CM | POA: Diagnosis present

## 2020-04-14 DIAGNOSIS — I672 Cerebral atherosclerosis: Secondary | ICD-10-CM | POA: Diagnosis present

## 2020-04-14 DIAGNOSIS — E875 Hyperkalemia: Secondary | ICD-10-CM | POA: Diagnosis not present

## 2020-04-14 DIAGNOSIS — I252 Old myocardial infarction: Secondary | ICD-10-CM

## 2020-04-14 DIAGNOSIS — Z8249 Family history of ischemic heart disease and other diseases of the circulatory system: Secondary | ICD-10-CM

## 2020-04-14 DIAGNOSIS — Z955 Presence of coronary angioplasty implant and graft: Secondary | ICD-10-CM

## 2020-04-14 DIAGNOSIS — I480 Paroxysmal atrial fibrillation: Secondary | ICD-10-CM | POA: Diagnosis not present

## 2020-04-14 DIAGNOSIS — I959 Hypotension, unspecified: Secondary | ICD-10-CM | POA: Diagnosis not present

## 2020-04-14 DIAGNOSIS — I10 Essential (primary) hypertension: Secondary | ICD-10-CM | POA: Diagnosis not present

## 2020-04-14 DIAGNOSIS — Z6828 Body mass index (BMI) 28.0-28.9, adult: Secondary | ICD-10-CM

## 2020-04-14 DIAGNOSIS — N179 Acute kidney failure, unspecified: Secondary | ICD-10-CM | POA: Diagnosis not present

## 2020-04-14 DIAGNOSIS — Z9889 Other specified postprocedural states: Secondary | ICD-10-CM | POA: Diagnosis not present

## 2020-04-14 DIAGNOSIS — I6389 Other cerebral infarction: Secondary | ICD-10-CM | POA: Diagnosis not present

## 2020-04-14 DIAGNOSIS — Z79899 Other long term (current) drug therapy: Secondary | ICD-10-CM

## 2020-04-14 DIAGNOSIS — I255 Ischemic cardiomyopathy: Secondary | ICD-10-CM | POA: Diagnosis present

## 2020-04-14 DIAGNOSIS — I63331 Cerebral infarction due to thrombosis of right posterior cerebral artery: Secondary | ICD-10-CM | POA: Diagnosis not present

## 2020-04-14 DIAGNOSIS — Z4659 Encounter for fitting and adjustment of other gastrointestinal appliance and device: Secondary | ICD-10-CM

## 2020-04-14 DIAGNOSIS — N184 Chronic kidney disease, stage 4 (severe): Secondary | ICD-10-CM | POA: Diagnosis not present

## 2020-04-14 DIAGNOSIS — E669 Obesity, unspecified: Secondary | ICD-10-CM | POA: Diagnosis present

## 2020-04-14 DIAGNOSIS — E785 Hyperlipidemia, unspecified: Secondary | ICD-10-CM | POA: Diagnosis present

## 2020-04-14 DIAGNOSIS — Z978 Presence of other specified devices: Secondary | ICD-10-CM

## 2020-04-14 DIAGNOSIS — Z9981 Dependence on supplemental oxygen: Secondary | ICD-10-CM

## 2020-04-14 DIAGNOSIS — I63212 Cerebral infarction due to unspecified occlusion or stenosis of left vertebral arteries: Secondary | ICD-10-CM | POA: Diagnosis not present

## 2020-04-14 DIAGNOSIS — E1122 Type 2 diabetes mellitus with diabetic chronic kidney disease: Secondary | ICD-10-CM | POA: Diagnosis present

## 2020-04-14 DIAGNOSIS — I6523 Occlusion and stenosis of bilateral carotid arteries: Secondary | ICD-10-CM | POA: Diagnosis not present

## 2020-04-14 DIAGNOSIS — I6503 Occlusion and stenosis of bilateral vertebral arteries: Secondary | ICD-10-CM | POA: Diagnosis not present

## 2020-04-14 DIAGNOSIS — Z833 Family history of diabetes mellitus: Secondary | ICD-10-CM

## 2020-04-14 DIAGNOSIS — N141 Nephropathy induced by other drugs, medicaments and biological substances: Secondary | ICD-10-CM | POA: Diagnosis not present

## 2020-04-14 DIAGNOSIS — Z951 Presence of aortocoronary bypass graft: Secondary | ICD-10-CM

## 2020-04-14 DIAGNOSIS — E1165 Type 2 diabetes mellitus with hyperglycemia: Secondary | ICD-10-CM | POA: Diagnosis present

## 2020-04-14 DIAGNOSIS — R531 Weakness: Secondary | ICD-10-CM | POA: Diagnosis not present

## 2020-04-14 DIAGNOSIS — R059 Cough, unspecified: Secondary | ICD-10-CM | POA: Diagnosis not present

## 2020-04-14 DIAGNOSIS — R29810 Facial weakness: Secondary | ICD-10-CM | POA: Diagnosis not present

## 2020-04-14 DIAGNOSIS — I82409 Acute embolism and thrombosis of unspecified deep veins of unspecified lower extremity: Secondary | ICD-10-CM | POA: Diagnosis present

## 2020-04-14 DIAGNOSIS — J90 Pleural effusion, not elsewhere classified: Secondary | ICD-10-CM | POA: Diagnosis not present

## 2020-04-14 LAB — COMPREHENSIVE METABOLIC PANEL
ALT: 16 U/L (ref 0–44)
AST: 33 U/L (ref 15–41)
Albumin: 2.9 g/dL — ABNORMAL LOW (ref 3.5–5.0)
Alkaline Phosphatase: 70 U/L (ref 38–126)
Anion gap: 13 (ref 5–15)
BUN: 47 mg/dL — ABNORMAL HIGH (ref 8–23)
CO2: 21 mmol/L — ABNORMAL LOW (ref 22–32)
Calcium: 8.7 mg/dL — ABNORMAL LOW (ref 8.9–10.3)
Chloride: 109 mmol/L (ref 98–111)
Creatinine, Ser: 3.09 mg/dL — ABNORMAL HIGH (ref 0.61–1.24)
GFR, Estimated: 21 mL/min — ABNORMAL LOW (ref >60)
Glucose, Bld: 160 mg/dL — ABNORMAL HIGH (ref 70–99)
Potassium: 4 mmol/L (ref 3.5–5.1)
Sodium: 143 mmol/L (ref 135–145)
Total Bilirubin: 1 mg/dL (ref 0.3–1.2)
Total Protein: 6.7 g/dL (ref 6.5–8.1)

## 2020-04-14 LAB — CBC WITH DIFFERENTIAL/PLATELET
Abs Immature Granulocytes: 0.05 10*3/uL (ref 0.00–0.07)
Basophils Absolute: 0 10*3/uL (ref 0.0–0.1)
Basophils Relative: 0 %
Eosinophils Absolute: 0 10*3/uL (ref 0.0–0.5)
Eosinophils Relative: 0 %
HCT: 41.5 % (ref 39.0–52.0)
Hemoglobin: 12.9 g/dL — ABNORMAL LOW (ref 13.0–17.0)
Immature Granulocytes: 1 %
Lymphocytes Relative: 14 %
Lymphs Abs: 1.1 10*3/uL (ref 0.7–4.0)
MCH: 26 pg (ref 26.0–34.0)
MCHC: 31.1 g/dL (ref 30.0–36.0)
MCV: 83.5 fL (ref 80.0–100.0)
Monocytes Absolute: 0.6 10*3/uL (ref 0.1–1.0)
Monocytes Relative: 8 %
Neutro Abs: 5.8 10*3/uL (ref 1.7–7.7)
Neutrophils Relative %: 77 %
Platelets: 268 10*3/uL (ref 150–400)
RBC: 4.97 MIL/uL (ref 4.22–5.81)
RDW: 17.6 % — ABNORMAL HIGH (ref 11.5–15.5)
WBC: 7.6 10*3/uL (ref 4.0–10.5)
nRBC: 0 % (ref 0.0–0.2)

## 2020-04-14 LAB — LACTIC ACID, PLASMA: Lactic Acid, Venous: 2 mmol/L (ref 0.5–1.9)

## 2020-04-14 LAB — PROTIME-INR
INR: 1.4 — ABNORMAL HIGH (ref 0.8–1.2)
Prothrombin Time: 16.3 seconds — ABNORMAL HIGH (ref 11.4–15.2)

## 2020-04-14 LAB — TROPONIN I (HIGH SENSITIVITY): Troponin I (High Sensitivity): 60 ng/L — ABNORMAL HIGH (ref <18)

## 2020-04-14 LAB — BRAIN NATRIURETIC PEPTIDE: B Natriuretic Peptide: 261.4 pg/mL — ABNORMAL HIGH (ref 0.0–100.0)

## 2020-04-14 LAB — I-STAT VENOUS BLOOD GAS, ED
Acid-base deficit: 2 mmol/L (ref 0.0–2.0)
Bicarbonate: 24.1 mmol/L (ref 20.0–28.0)
Calcium, Ion: 1.15 mmol/L (ref 1.15–1.40)
HCT: 39 % (ref 39.0–52.0)
Hemoglobin: 13.3 g/dL (ref 13.0–17.0)
O2 Saturation: 91 %
Potassium: 4 mmol/L (ref 3.5–5.1)
Sodium: 145 mmol/L (ref 135–145)
TCO2: 25 mmol/L (ref 22–32)
pCO2, Ven: 44.2 mmHg (ref 44.0–60.0)
pH, Ven: 7.346 (ref 7.250–7.430)
pO2, Ven: 64 mmHg — ABNORMAL HIGH (ref 32.0–45.0)

## 2020-04-14 LAB — URINALYSIS, ROUTINE W REFLEX MICROSCOPIC
Bacteria, UA: NONE SEEN
Bilirubin Urine: NEGATIVE
Glucose, UA: NEGATIVE mg/dL
Ketones, ur: NEGATIVE mg/dL
Leukocytes,Ua: NEGATIVE
Nitrite: NEGATIVE
Protein, ur: >=300 mg/dL — AB
Specific Gravity, Urine: 1.016 (ref 1.005–1.030)
pH: 5 (ref 5.0–8.0)

## 2020-04-14 LAB — RESPIRATORY PANEL BY RT PCR (FLU A&B, COVID)
Influenza A by PCR: NEGATIVE
Influenza B by PCR: NEGATIVE
SARS Coronavirus 2 by RT PCR: POSITIVE — AB

## 2020-04-14 LAB — APTT: aPTT: 37 seconds — ABNORMAL HIGH (ref 24–36)

## 2020-04-14 MED ORDER — DEXAMETHASONE SODIUM PHOSPHATE 10 MG/ML IJ SOLN
6.0000 mg | INTRAMUSCULAR | Status: DC
  Administered 2020-04-15: 6 mg via INTRAVENOUS
  Filled 2020-04-14: qty 1

## 2020-04-14 MED ORDER — SODIUM CHLORIDE 0.9% FLUSH
3.0000 mL | Freq: Two times a day (BID) | INTRAVENOUS | Status: DC
  Administered 2020-04-15 – 2020-04-19 (×10): 3 mL via INTRAVENOUS

## 2020-04-14 MED ORDER — SODIUM CHLORIDE 0.9 % IV SOLN
500.0000 mg | INTRAVENOUS | Status: DC
  Administered 2020-04-14: 500 mg via INTRAVENOUS
  Filled 2020-04-14 (×2): qty 500

## 2020-04-14 MED ORDER — SODIUM CHLORIDE 0.9 % IV SOLN: 100.0000 mg | Freq: Every day | INTRAVENOUS | Status: DC

## 2020-04-14 MED ORDER — DEXAMETHASONE SODIUM PHOSPHATE 10 MG/ML IJ SOLN: 6.0000 mg | Freq: Once | INTRAMUSCULAR | Status: DC

## 2020-04-14 MED ORDER — ONDANSETRON HCL 4 MG/2ML IJ SOLN: 4.0000 mg | Freq: Four times a day (QID) | INTRAMUSCULAR | Status: DC | PRN

## 2020-04-14 MED ORDER — ONDANSETRON HCL 4 MG PO TABS: 4.0000 mg | ORAL_TABLET | Freq: Four times a day (QID) | ORAL | Status: DC | PRN

## 2020-04-14 MED ORDER — IPRATROPIUM-ALBUTEROL 20-100 MCG/ACT IN AERS
1.0000 | INHALATION_SPRAY | Freq: Four times a day (QID) | RESPIRATORY_TRACT | Status: DC
  Administered 2020-04-15 (×4): 1 via RESPIRATORY_TRACT
  Filled 2020-04-14: qty 4

## 2020-04-14 MED ORDER — SODIUM CHLORIDE 0.9 % IV SOLN
200.0000 mg | Freq: Once | INTRAVENOUS | Status: AC
  Administered 2020-04-15: 200 mg via INTRAVENOUS
  Filled 2020-04-14: qty 40

## 2020-04-14 MED ORDER — SODIUM CHLORIDE 0.9 % IV SOLN
100.0000 mg | Freq: Every day | INTRAVENOUS | Status: AC
  Administered 2020-04-16 – 2020-04-19 (×4): 100 mg via INTRAVENOUS
  Filled 2020-04-14 (×4): qty 20

## 2020-04-14 MED ORDER — SODIUM CHLORIDE 0.9 % IV SOLN: 200.0000 mg | Freq: Once | INTRAVENOUS | Status: DC

## 2020-04-14 MED ORDER — SODIUM CHLORIDE 0.9 % IV SOLN
2.0000 g | INTRAVENOUS | Status: DC
  Administered 2020-04-14: 2 g via INTRAVENOUS
  Filled 2020-04-14: qty 20

## 2020-04-14 MED ORDER — APIXABAN 5 MG PO TABS
5.0000 mg | ORAL_TABLET | Freq: Two times a day (BID) | ORAL | Status: DC
  Administered 2020-04-15 – 2020-04-19 (×10): 5 mg via ORAL
  Filled 2020-04-14 (×10): qty 1

## 2020-04-14 MED ORDER — LACTATED RINGERS IV BOLUS (SEPSIS): 500.0000 mL | Freq: Once | INTRAVENOUS | Status: DC

## 2020-04-14 MED ORDER — SODIUM CHLORIDE 0.9 % IV SOLN: 250.0000 mL | INTRAVENOUS | Status: DC | PRN

## 2020-04-14 MED ORDER — LACTATED RINGERS IV SOLN: INTRAVENOUS | Status: AC

## 2020-04-14 MED ORDER — SODIUM CHLORIDE 0.9% FLUSH: 3.0000 mL | INTRAVENOUS | Status: DC | PRN

## 2020-04-14 MED ORDER — FUROSEMIDE 10 MG/ML IJ SOLN: 40.0000 mg | Freq: Two times a day (BID) | INTRAMUSCULAR | Status: DC

## 2020-04-14 NOTE — ED Triage Notes (Signed)
Pt to rm via EMS.  Pt has had 3 days of weakness, shortness of breath and non-productive cough. Pt room air oxygen saturation =84%, increased to 95% with 3LNC by EMS. Pt A&Ox3 on arrival, 88% on 4LNC. MD at bedside.

## 2020-04-14 NOTE — ED Provider Notes (Signed)
[facility] EMERGENCY DEPARTMENT Provider Note   CSN: [id number] Arrival date & time: 04/05/2020  2057     History Chief Complaint  Patient presents with   Shortness of Breath    Justin Blackburn. is a 69 y.o. male.  The history is provided by the patient and the EMS personnel.  Shortness of Breath Severity:  Moderate Onset quality:  Gradual Duration:  3 days Timing:  Constant Progression:  Worsening Chronicity:  New Relieved by:  Nothing Worsened by:  Nothing Associated symptoms: cough   Associated symptoms: no abdominal pain, no chest pain, no ear pain, no fever, no rash, no sore throat and no vomiting        Past Medical History:  Diagnosis Date   A-fib ([place])    with RVR   Acute kidney injury ([place])    Anemia    Angina    Arthritis    "hands" (08/10/2017)   Atrial flutter, paroxysmal ([place])    arrived with aflutter with RVR, EF down to 25-30%, s/p TEE DCCV on 01/29/2015   Cancer (HCC)    CHF (congestive heart failure) (HCC)    Chronic lower back pain    "all the time from my sciatic nerve"   Coronary artery disease    DVT (deep venous thrombosis) (HCC)    Dyspnea    Erectile dysfunction    Heart failure ([place])    Hypercholesteremia    Hypertension    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    Myocardial infarction ([place]) 1988; ~1992;  ~1996;~ 2000   Obesity    Peripheral vascular disease ([place])    Umbilical hernia    unrepaired    Patient Active Problem List   Diagnosis Date Noted   CKD (chronic kidney disease), stage IV ([place]) 03/09/2020   AF (paroxysmal atrial fibrillation) (HCC)    Steroid-induced hyperglycemia    Supplemental oxygen dependent    Diffuse interstitial pulmonary disease (HCC)    Microcytic anemia 01/16/2018   History of DVT (deep vein thrombosis) 01/15/2018   Diabetes mellitus type 2, insulin dependent (HCC)    Esophageal cancer ([place]) 12/20/2017   DVT (deep venous  thrombosis) (HCC) 12/07/2017   Atrial fibrillation, persistent ([place]) 12/06/2017   Encounter for antineoplastic chemotherapy 11/11/2017   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   Atrial flutter ([place]) 09/01/2017   Iron deficiency anemia    Coronary artery disease involving coronary bypass graft of native heart without angina pectoris 08/28/2016   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   Anticoagulated 02/06/2015   Chronic combined systolic and diastolic heart failure, NYHA class 2 ([place]) 22/97/9892   Umbilical hernia    NSTEMI (non-ST elevated myocardial infarction) ([place])    Peripheral vascular disease ([place])    Hypercholesteremia    Hypertension 09/01/2011    Past Surgical History:  Procedure Laterality Date   CARDIOVERSION N/A 01/29/2015   Procedure: CARDIOVERSION;  Surgeon: [name], MD;  Location: [place];  Service: Cardiovascular;  Laterality: N/A;   CARDIOVERSION N/A 04/26/2018   Procedure: CARDIOVERSION;  Surgeon: Josue Hector, MD;  Location: [place] ENDOSCOPY;  Service: Cardiovascular;  Laterality: N/A;   CORONARY ANGIOPLASTY WITH STENT PLACEMENT  08/31/11   "2 today; makes total of 3 stents"   CORONARY ANGIOPLASTY WITH STENT PLACEMENT  08/10/2017   "2 today; makes a total of 5 stents" (08/10/2017)   [place]; 2007   CABG X  4; CABG X4   CORONARY STENT INTERVENTION N/A 08/10/2017   Procedure: CORONARY STENT INTERVENTION;  Surgeon: Sherren Mocha, MD;  Location: [place] CV LAB;  Service: Cardiovascular;  Laterality: N/A;   ESOPHAGOGASTRODUODENOSCOPY (EGD) WITH PROPOFOL N/A 09/01/2017   Procedure: ESOPHAGOGASTRODUODENOSCOPY (EGD) WITH PROPOFOL;  Surgeon: Laurence Spates, MD;  Location: [place];  Service: Endoscopy;  Laterality: N/A;   EUS N/A 09/29/2017   Procedure: UPPER ENDOSCOPIC ULTRASOUND (EUS) LINEAR;  Surgeon: Arta Silence, MD;  Location: [facility] ENDOSCOPY;   Service: Endoscopy;  Laterality: N/A;   LEFT HEART CATH AND CORS/GRAFTS ANGIOGRAPHY N/A 08/10/2017   Procedure: LEFT HEART CATH AND CORS/GRAFTS ANGIOGRAPHY;  Surgeon: Sherren Mocha, MD;  Location: [place] CV LAB;  Service: Cardiovascular;  Laterality: N/A;   LEFT HEART CATHETERIZATION WITH CORONARY/GRAFT ANGIOGRAM N/A 08/31/2011   Procedure: LEFT HEART CATHETERIZATION WITH Beatrix Fetters;  Surgeon: [name], MD;  Location: [facility] CATH LAB;  Service: Cardiovascular;  Laterality: N/A;   PERIPHERAL VASCULAR CATHETERIZATION N/A 04/27/2016   Procedure: Abdominal Aortogram w/Lower Extremity;  Surgeon: Angelia Mould, MD;  Location: [place] CV LAB;  Service: Cardiovascular;  Laterality: N/A;   TEE WITHOUT CARDIOVERSION N/A 01/29/2015   Procedure: TRANSESOPHAGEAL ECHOCARDIOGRAM (TEE);  Surgeon: [name], MD;  Location: [facility] ENDOSCOPY;  Service: Cardiovascular;  Laterality: N/A;       Family History  Problem Relation Age of Onset   Heart disease Mother    Heart attack Mother    Diabetes Mother    Heart disease Father    Heart attack Father    Diabetes Father    Heart attack Brother    Stroke Neg Hx     Social History   Tobacco Use   Smoking status: Former Smoker    Packs/day: 2.00    Years: 6.00    Pack years: 12.00    Types: Cigarettes    Quit date: 06/16/1971    Years since quitting: 48.8   Smokeless tobacco: Never Used   Tobacco comment: "chewed on cigars; never really did chew tobacco"  Vaping Use   Vaping Use: Never used  Substance Use Topics   Alcohol use: No   Drug use: No    Home Medications Prior to Admission medications   Medication Sig Start Date End Date Taking? Authorizing Provider  acetaminophen (TYLENOL) 500 MG tablet Take 1,000 mg by mouth every 6 (six) hours as needed for headache (pain).    [provider]  amiodarone (PACERONE) 200 MG tablet TAKE 1 TABLET BY MOUTH EVERY DAY 03/21/20   Belva Crome,  MD  ELIQUIS 5 MG TABS tablet TAKE 1 TABLET BY MOUTH TWICE A DAY 03/22/19   Belva Crome, MD  gabapentin (NEURONTIN) 300 MG capsule Take 300 mg by mouth 2 (two) times daily.    [provider]  insulin degludec (TRESIBA FLEXTOUCH) 100 UNIT/ML SOPN FlexTouch Pen Inject 50 Units into the skin daily as needed (blurry vision).     [provider]  losartan (COZAAR) 50 MG tablet Take 1 tablet (50 mg total) by mouth daily. 03/14/20   Belva Crome, MD  metFORMIN (GLUCOPHAGE) 1000 MG tablet Take 1,000 mg by mouth 2 (two) times daily with a meal. 05/19/18   [provider]  nitroGLYCERIN (NITROSTAT) 0.4 MG SL tablet PLACE 1 TABLET UNDER THE TONGUE EVERY 5 (FIVE) MINUTES AS NEEDED FOR CHEST PAIN (ANGINA). 01/23/20   Belva Crome, MD  pantoprazole (PROTONIX) 40 MG tablet TAKE 1 TABLET BY MOUTH EVERY  DAY 10/23/19   Belva Crome, MD  rosuvastatin (CRESTOR) 40 MG tablet Take 1 tablet (40 mg total) by mouth daily. 04/20/19 05/14/20  Belva Crome, MD    Allergies    Patient has no known allergies.  Review of Systems   Review of Systems  Constitutional: Positive for fatigue. Negative for chills and fever.  HENT: Negative for ear pain and sore throat.   Eyes: Negative for pain and visual disturbance.  Respiratory: Positive for cough and shortness of breath.   Cardiovascular: Negative for chest pain and palpitations.  Gastrointestinal: Negative for abdominal pain and vomiting.  Genitourinary: Negative for dysuria and hematuria.  Musculoskeletal: Negative for arthralgias and back pain.  Skin: Negative for color change and rash.  Neurological: Negative for seizures and syncope.  All other systems reviewed and are negative.   Physical Exam Updated Vital Signs BP (!) 145/84    Pulse 69    Temp (!) 100.5 F (38.1 C) (Oral)    Resp (!) 25    Ht 6' (1.829 m)    Wt 94.3 kg    SpO2 96%    BMI 28.21 kg/m   Physical Exam Vitals and nursing note reviewed.  Constitutional:       Appearance: He is obese. He is not ill-appearing, toxic-appearing or diaphoretic.  HENT:     Head: Normocephalic and atraumatic.  Eyes:     Conjunctiva/sclera: Conjunctivae normal.  Cardiovascular:     Rate and Rhythm: Normal rate and regular rhythm.     Heart sounds: No murmur heard.   Pulmonary:     Effort: Pulmonary effort is normal. Tachypnea present. No respiratory distress.     Breath sounds: Examination of the right-lower field reveals rales. Examination of the left-lower field reveals rales. Rales present.  Abdominal:     Palpations: Abdomen is soft.     Tenderness: There is no abdominal tenderness.     Hernia: A hernia is present. Hernia is present in the umbilical area (soft, nontender, easily reduced).  Musculoskeletal:     Cervical back: Neck supple.     Right lower leg: No tenderness. 1+ Pitting Edema present.     Left lower leg: No tenderness. 1+ Pitting Edema present.  Skin:    General: Skin is warm and dry.     Capillary Refill: Capillary refill takes less than 2 seconds.  Neurological:     Mental Status: He is alert and oriented to person, place, and time.     ED Results / Procedures / Treatments   Labs (all labs ordered are listed, but only abnormal results are displayed) Labs Reviewed  RESPIRATORY PANEL BY RT PCR (FLU A&B, COVID) - Abnormal; Notable for the following components:      Result Value   SARS Coronavirus 2 by RT PCR POSITIVE (*)    All other components within normal limits  CBC WITH DIFFERENTIAL/PLATELET - Abnormal; Notable for the following components:   Hemoglobin 12.9 (*)    RDW 17.6 (*)    All other components within normal limits  BRAIN NATRIURETIC PEPTIDE - Abnormal; Notable for the following components:   B Natriuretic Peptide 261.4 (*)    All other components within normal limits  COMPREHENSIVE METABOLIC PANEL - Abnormal; Notable for the following components:   CO2 21 (*)    Glucose, Bld 160 (*)    BUN 47 (*)    Creatinine, Ser 3.09  (*)    Calcium 8.7 (*)    Albumin 2.9 (*)  GFR, Estimated 21 (*)    All other components within normal limits  LACTIC ACID, PLASMA - Abnormal; Notable for the following components:   Lactic Acid, Venous 2.0 (*)    All other components within normal limits  PROTIME-INR - Abnormal; Notable for the following components:   Prothrombin Time 16.3 (*)    INR 1.4 (*)    All other components within normal limits  APTT - Abnormal; Notable for the following components:   aPTT 37 (*)    All other components within normal limits  URINALYSIS, ROUTINE W REFLEX MICROSCOPIC - Abnormal; Notable for the following components:   APPearance HAZY (*)    Hgb urine dipstick MODERATE (*)    Protein, ur >=300 (*)    All other components within normal limits  I-STAT VENOUS BLOOD GAS, ED - Abnormal; Notable for the following components:   pO2, Ven 64.0 (*)    All other components within normal limits  TROPONIN I (HIGH SENSITIVITY) - Abnormal; Notable for the following components:   Troponin I (High Sensitivity) 60 (*)    All other components within normal limits  CULTURE, BLOOD (ROUTINE X 2)  CULTURE, BLOOD (ROUTINE X 2)  URINE CULTURE  LACTIC ACID, PLASMA    EKG EKG Interpretation  Date/Time:  Sunday April 14 2020 21:01:28 EDT Ventricular Rate:  80 PR Interval:    QRS Duration: 141 QT Interval:  409 QTC Calculation: 472 R Axis:   -65 Text Interpretation: Sinus rhythm Right bundle branch block Left ventricular hypertrophy Inferior infarct, old Anterior infarct, old No significant change since 03/02/2020 Confirmed by Veryl Speak [phone]) on 03/20/2020 10:03:24 PM   Radiology DG Chest Port 1 View  Result Date: 04/11/2020 CLINICAL DATA:  Weakness and shortness of breath. EXAM: PORTABLE CHEST 1 VIEW COMPARISON:  January 20, 2018 FINDINGS: Multiple sternal wires and vascular clips are seen. Mild, chronic diffusely increased interstitial lung markings are noted. Mild to moderate severity areas of  atelectasis and/or infiltrate are seen within the bilateral lung bases. Small bilateral pleural effusions are seen. No pneumothorax is identified. The cardiac silhouette is moderately enlarged. The visualized skeletal structures are unremarkable. IMPRESSION: 1. Mild to moderate severity bibasilar atelectasis and/or infiltrate. 2. Small bilateral pleural effusions. 3. Chronic interstitial lung disease. 4. Moderate cardiomegaly. Electronically Signed   By: Virgina Norfolk M.D.   On: 03/18/2020 21:22    Procedures Procedures (including critical care time)  Medications Ordered in ED Medications  lactated ringers infusion ( Intravenous Not Given 04/13/2020 2151)  lactated ringers bolus 500 mL (500 mLs Intravenous Not Given 04/12/2020 2152)  cefTRIAXone (ROCEPHIN) 2 g in sodium chloride 0.9 % 100 mL IVPB (0 g Intravenous Stopped 04/04/2020 2224)  azithromycin (ZITHROMAX) 500 mg in sodium chloride 0.9 % 250 mL IVPB (0 mg Intravenous Stopped 03/27/2020 2255)  dexamethasone (DECADRON) injection 6 mg (has no administration in time range)    ED Course  I have reviewed the triage vital signs and the nursing notes.  Pertinent labs & imaging results that were available during my care of the patient were reviewed by me and considered in my medical decision making (see chart for details).    MDM Rules/Calculators/A&P                          The patient is a 69yo male, PMH CAD, atrial fibrillation and flutter on Eliquis, CHF last EF 45 to 50% with some diastolic dysfunction, vaccinated for Covid, who presents to  the ED via EMS for shortness of breath and cough.  On my initial evaluation, the patient is hemodynamically stable, febrile, nontoxic-appearing. Physical exam remarkable for tachypnea, bibasilar rales, trace BLE edema. Due to febrile status and tachypnea, initiated sepsis protocol but deferred IV fluid bolus due to concomitant concern for CHF exacerbation and septic shock on arrival. Provided IV Rocephin  and azithromycin for concern for community-acquired pneumonia.  Chest x-ray concerning for bibasilar opacities concerning for edema or infection. Considering patient is mildly febrile on arrival with acute onset and new cough, concern more for pneumonia. Patient is Covid positive. EKG normal sinus rhythm with right bundle branch block, deep Q waves in inferior and anterior leads concerning for old infarcts, T wave inversions in aVL, but all of these findings are unchanged from an EKG obtained over a month ago, no new ischemic changes.   Other labs remarkable for ongoing CKD, elevated BNP, intermediate troponin. Because patient is comfortable support provided IV Decadron. Advised patient of diagnosis of Covid requiring high oxygen. Patient and wife at bedside had many questions about how he could be vaccinated for Covid but can still get it. Provided patient education.  Consulted patient provided IV decadron for Covid pneumonia with hypoxia. Consulted hospitalist for admission. Patient accepted to their service.  The care of this patient was overseen by Dr. Stark Jock, who agreed with evaluation and plan of care.   Final Clinical Impression(s) / ED Diagnoses Final diagnoses:  Acute hypoxemic respiratory failure ([place])  Pneumonia due to severe acute respiratory syndrome coronavirus 2 (SARS-CoV-2)    Rx / DC Orders ED Discharge Orders    None       Launa Flight, MD 03/20/2020 2313    Veryl Speak, MD 04/16/20 2319

## 2020-04-14 NOTE — H&P (Addendum)
History and Physical   Justin Blackburn. ZMO:[id number] DOB: [birth year] DOA: 04/10/2020  Referring MD/NP/PA: Dr Justin Blackburn  PCP: Justin Bender, PA-C   Outpatient Specialists: Dr. Conchita Blackburn, cardiology and Justin Blackburn, oncology  Patient coming from: Home  Chief Complaint: Fever, SOB  HPI: Justin Blackburn. is a 69 y.o. male with medical history significant of atrial fibrillation and flutter, coronary artery disease, diabetes, ischemic cardiomyopathy with EF of 45 to 50%, chronic kidney disease stage III, history of DVT on chronic anticoagulation, hyperlipidemia, essential hypertension, esophageal cancer and anemia of chronic disease who presented to the ER with progressive shortness of breath fever and cough.  Patient was seen and evaluated.  Patient described his fever as progressive over the last few days.  Associated with cough.  No hemoptysis.  No clear known contact with COVID-19.  Patient was seen by EMS at home with oxygen sats in the 80s.  He was placed on high flow oxygen and transported to the ER.  He has chronic systolic dysfunction with dyspnea but this is different.  In the ER he was evaluated and found to be COVID-19 positive.  He fits the criteria for sepsis secondary to COVID-19 infection.  He also appeared to be fluid overloaded requiring diuresis.  His chest x-ray shows findings consistent with COVID-19 pneumonia but also edema.  Is being admitted with acute on chronic respiratory failure with hypoxia secondary to COVID-19 pneumonia and systolic CHF exacerbation.  Patient was fully vaccinated against COVID-19  ED Course: Temperature 100.5 blood pressure 180/76 pulse 79 respirate of 31 oxygen sats currently 91% on 3 L.  White count 7.6 hemoglobin 13.3 platelets 268.  Sodium 145 potassium 4.0 chloride 109 CO2 20 BUN 47 creatinine 3.09 calcium 8.7 INR 1.4 lactic acid 2.0 and glucose 160.  ABG showed a venous pH of 7.346.  Urinalysis essentially negative.  COVID-19 screen is positive.   Chest x-ray showed mild to moderate severity bibasilar atelectasis or infiltrate and small pleural effusions chronic interstitial lung disease.  Patient being admitted with acute on chronic respiratory failure secondary to COVID-19 pneumonia and CHF exacerbation.  Review of Systems: As per HPI otherwise 10 point review of systems negative.    Past Medical History:  Diagnosis Date  . A-fib ([place])    with RVR  . Acute kidney injury ([place])   . Anemia   . Angina   . Arthritis    "hands" (08/10/2017)  . Atrial flutter, paroxysmal ([place])    arrived with aflutter with RVR, EF down to 25-30%, s/p TEE DCCV on 01/29/2015  . Cancer ([place])   . CHF (congestive heart failure) ([place])   . Chronic lower back pain    "all the time from my sciatic nerve"  . Coronary artery disease   . DVT (deep venous thrombosis) ([place])   . Dyspnea   . Erectile dysfunction   . Heart failure ([place])   . Hypercholesteremia   . Hypertension   . 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 Myocardial infarction ([place]) 1988; ~0354;  ~1996;~ 2000  . Obesity   . Peripheral vascular disease ([place])   . Umbilical hernia    unrepaired    Past Surgical History:  Procedure Laterality Date  . CARDIOVERSION N/A 01/29/2015   Procedure: CARDIOVERSION;  Surgeon: [name], MD;  Location: [place];  Service: Cardiovascular;  Laterality: N/A;  . CARDIOVERSION N/A 04/26/2018   Procedure: CARDIOVERSION;  Surgeon: Josue Hector, MD;  Location: [facility] ENDOSCOPY;  Service: Cardiovascular;  Laterality: N/A;  . CORONARY ANGIOPLASTY WITH STENT PLACEMENT  08/31/11   "2 today; makes total of 3 stents"  . CORONARY ANGIOPLASTY WITH STENT PLACEMENT  08/10/2017   "2 today; makes a total of 5 stents" (08/10/2017)  . CORONARY ARTERY BYPASS GRAFT  1998; 2007   CABG X 4; CABG X4  . CORONARY STENT INTERVENTION N/A 08/10/2017   Procedure: CORONARY STENT INTERVENTION;  Surgeon: Sherren Mocha, MD;  Location: [place] CV LAB;   Service: Cardiovascular;  Laterality: N/A;  . ESOPHAGOGASTRODUODENOSCOPY (EGD) WITH PROPOFOL N/A 09/01/2017   Procedure: ESOPHAGOGASTRODUODENOSCOPY (EGD) WITH PROPOFOL;  Surgeon: Laurence Spates, MD;  Location: [place];  Service: Endoscopy;  Laterality: N/A;  . EUS N/A 09/29/2017   Procedure: UPPER ENDOSCOPIC ULTRASOUND (EUS) LINEAR;  Surgeon: Arta Silence, MD;  Location: [facility] ENDOSCOPY;  Service: Endoscopy;  Laterality: N/A;  . LEFT HEART CATH AND CORS/GRAFTS ANGIOGRAPHY N/A 08/10/2017   Procedure: LEFT HEART CATH AND CORS/GRAFTS ANGIOGRAPHY;  Surgeon: Sherren Mocha, MD;  Location: [place] [facility] LAB;  Service: Cardiovascular;  Laterality: N/A;  . LEFT HEART CATHETERIZATION WITH CORONARY/GRAFT ANGIOGRAM N/A 08/31/2011   Procedure: LEFT HEART CATHETERIZATION WITH Beatrix Fetters;  Surgeon: [name], MD;  Location: [facility] CATH LAB;  Service: Cardiovascular;  Laterality: N/A;  . PERIPHERAL VASCULAR CATHETERIZATION N/A 04/27/2016   Procedure: Abdominal Aortogram w/Lower Extremity;  Surgeon: Angelia Mould, MD;  Location: [place] CV LAB;  Service: Cardiovascular;  Laterality: N/A;  . TEE WITHOUT CARDIOVERSION N/A 01/29/2015   Procedure: TRANSESOPHAGEAL ECHOCARDIOGRAM (TEE);  Surgeon: [name], MD;  Location: [facility] ENDOSCOPY;  Service: Cardiovascular;  Laterality: N/A;     reports that he quit smoking about 48 years ago. His smoking use included cigarettes. He has a 12.00 pack-year smoking history. He has never used smokeless tobacco. He reports that he does not drink alcohol and does not use drugs.  No Known Allergies  Family History  Problem Relation Age of Onset  . Heart disease Mother   . Heart attack Mother   . Diabetes Mother   . Heart disease Father   . Heart attack Father   . Diabetes Father   . Heart attack Brother   . Stroke Neg Hx      Prior to Admission medications   Medication Sig Start Date End Date Taking? Authorizing Provider  acetaminophen  (TYLENOL) 500 MG tablet Take 1,000 mg by mouth every 6 (six) hours as needed for headache (pain).    [provider]  amiodarone (PACERONE) 200 MG tablet TAKE 1 TABLET BY MOUTH EVERY DAY 03/21/20   Belva Crome, MD  ELIQUIS 5 MG TABS tablet TAKE 1 TABLET BY MOUTH TWICE A DAY 03/22/19   Belva Crome, MD  gabapentin (NEURONTIN) 300 MG capsule Take 300 mg by mouth 2 (two) times daily.    [provider]  insulin degludec (TRESIBA FLEXTOUCH) 100 UNIT/ML SOPN FlexTouch Pen Inject 50 Units into the skin daily as needed (blurry vision).     [provider]  losartan (COZAAR) 50 MG tablet Take 1 tablet (50 mg total) by mouth daily. 03/14/20   Belva Crome, MD  metFORMIN (GLUCOPHAGE) 1000 MG tablet Take 1,000 mg by mouth 2 (two) times daily with a meal. 05/19/18   [provider]  nitroGLYCERIN (NITROSTAT) 0.4 MG SL tablet PLACE 1 TABLET UNDER THE TONGUE EVERY 5 (FIVE) MINUTES AS NEEDED FOR CHEST PAIN (ANGINA). 01/23/20   Belva Crome, MD  pantoprazole ([place]) 40  MG tablet TAKE 1 TABLET BY MOUTH EVERY DAY 10/23/19   Belva Crome, MD  rosuvastatin (CRESTOR) 40 MG tablet Take 1 tablet (40 mg total) by mouth daily. 04/20/19 05/14/20  Belva Crome, MD    Physical Exam: Vitals:   03/30/2020 2230 03/29/2020 2245 04/04/2020 2300 03/25/2020 2315  BP: (!) 145/81 (!) 141/80 (!) 145/84 (!) 147/79  Pulse: 72 70 69 70  Resp: (!) 29 18 (!) 25 (!) 29  Temp:      TempSrc:      SpO2: 95% 98% 96% 97%  Weight:      Height:          Constitutional: Chronically ill looking, no distress Vitals:   04/01/2020 2230 04/01/2020 2245 03/26/2020 2300 03/21/2020 2315  BP: (!) 145/81 (!) 141/80 (!) 145/84 (!) 147/79  Pulse: 72 70 69 70  Resp: (!) 29 18 (!) 25 (!) 29  Temp:      TempSrc:      SpO2: 95% 98% 96% 97%  Weight:      Height:       Eyes: PERRL, lids and conjunctivae normal ENMT: Mucous membranes are moist. Posterior pharynx clear of any exudate or lesions.Normal dentition.    Neck: normal, supple, no masses, no thyromegaly, mild JVD Respiratory: Decreased air entry bilaterally with basal crackles and rhonchi, mild expiratory wheezing,. No accessory muscle use.  Cardiovascular: Sinus tachycardia, no murmurs / rubs / gallops.  1+ extremity edema. 2+ pedal pulses. No carotid bruits.  Abdomen: no tenderness, no masses palpated. No hepatosplenomegaly. Bowel sounds positive.  Musculoskeletal: no clubbing / cyanosis. No joint deformity upper and lower extremities. Good ROM, no contractures. Normal muscle tone.  Skin: no rashes, lesions, ulcers. No induration Neurologic: CN 2-12 grossly intact. Sensation intact, DTR normal. Strength 5/5 in all 4.  Psychiatric: Normal judgment and insight. Alert and oriented x 3. Normal mood.     Labs on Admission: I have personally reviewed following labs and imaging studies  CBC: Recent Labs  Lab 04/08/2020 2100 04/13/2020 2135  WBC 7.6  --   NEUTROABS 5.8  --   HGB 12.9* 13.3  HCT 41.5 39.0  MCV 83.5  --   PLT 268  --    Basic Metabolic Panel: Recent Labs  Lab 03/30/2020 2100 03/19/2020 2135  NA 143 145  K 4.0 4.0  CL 109  --   CO2 21*  --   GLUCOSE 160*  --   BUN 47*  --   CREATININE 3.09*  --   CALCIUM 8.7*  --    GFR: Estimated Creatinine Clearance: 26.1 mL/min (A) (by C-G formula based on SCr of 3.09 mg/dL (H)). Liver Function Tests: Recent Labs  Lab 03/28/2020 2100  AST 33  ALT 16  ALKPHOS 70  BILITOT 1.0  PROT 6.7  ALBUMIN 2.9*   No results for input(s): LIPASE, AMYLASE in the last 168 hours. No results for input(s): AMMONIA in the last 168 hours. Coagulation Profile: Recent Labs  Lab 04/13/2020 2119  INR 1.4*   Cardiac Enzymes: No results for input(s): CKTOTAL, CKMB, CKMBINDEX, TROPONINI in the last 168 hours. BNP (last 3 results) No results for input(s): PROBNP in the last 8760 hours. HbA1C: No results for input(s): HGBA1C in the last 72 hours. CBG: No results for input(s): GLUCAP in the last 168  hours. Lipid Profile: No results for input(s): CHOL, HDL, LDLCALC, TRIG, CHOLHDL, LDLDIRECT in the last 72 hours. Thyroid Function Tests: No results for input(s): TSH, T4TOTAL, FREET4,  T3FREE, THYROIDAB in the last 72 hours. Anemia Panel: No results for input(s): VITAMINB12, FOLATE, FERRITIN, TIBC, IRON, RETICCTPCT in the last 72 hours. Urine analysis:    Component Value Date/Time   COLORURINE YELLOW 03/31/2020 2119   APPEARANCEUR HAZY (A) 03/26/2020 2119   LABSPEC 1.016 03/17/2020 2119   PHURINE 5.0 03/21/2020 2119   GLUCOSEU NEGATIVE 04/10/2020 2119   HGBUR MODERATE (A) 04/09/2020 2119   BILIRUBINUR NEGATIVE 03/20/2020 2119   KETONESUR NEGATIVE 04/04/2020 2119   PROTEINUR >=300 (A) 03/31/2020 2119   NITRITE NEGATIVE 03/25/2020 2119   LEUKOCYTESUR NEGATIVE 04/07/2020 2119   Sepsis Labs: @LABRCNTIP (procalcitonin:4,lacticidven:4) ) Recent Results (from the past 240 hour(s))  Respiratory Panel by RT PCR (Flu A&B, Covid) - Nasopharyngeal Swab     Status: Abnormal   Collection Time: 04/13/2020  9:00 PM   Specimen: Nasopharyngeal Swab  Result Value Ref Range Status   SARS Coronavirus 2 by RT PCR POSITIVE (A) NEGATIVE Final    Comment: RESULT CALLED TO, READ BACK BY AND VERIFIED WITH: RN C BLACK @[id number]  04/04/2020 BY S GEZAHEGN (NOTE) SARS-CoV-2 target nucleic acids are DETECTED.  SARS-CoV-2 RNA is generally detectable in upper respiratory specimens  during the acute phase of infection. Positive results are indicative of the presence of the identified virus, but do not rule out bacterial infection or co-infection with other pathogens not detected by the test. Clinical correlation with patient history and other diagnostic information is necessary to determine patient infection status. The expected result is Negative.  Fact Sheet for Patients:  PinkCheek.be  Fact Sheet for Healthcare Providers: GravelBags.it  This test is  not yet approved or cleared by the [place] FDA and  has been authorized for detection and/or diagnosis of SARS-CoV-2 by FDA under an Emergency Use Authorization (EUA).  This EUA will remain in effect (meaning this test ca n be used) for the duration of  the COVID-19 declaration under Section 564(b)(1) of the Act, 21 U.S.C. section 360bbb-3(b)(1), unless the authorization is terminated or revoked sooner.      Influenza A by PCR NEGATIVE NEGATIVE Final   Influenza B by PCR NEGATIVE NEGATIVE Final    Comment: (NOTE) The Xpert Xpress SARS-CoV-2/FLU/RSV assay is intended as an aid in  the diagnosis of influenza from Nasopharyngeal swab specimens and  should not be used as a sole basis for treatment. Nasal washings and  aspirates are unacceptable for Xpert Xpress SARS-CoV-2/FLU/RSV  testing.  Fact Sheet for Patients: PinkCheek.be  Fact Sheet for Healthcare Providers: GravelBags.it  This test is not yet approved or cleared by the [place] FDA and  has been authorized for detection and/or diagnosis of SARS-CoV-2 by  FDA under an Emergency Use Authorization (EUA). This EUA will remain  in effect (meaning this test can be used) for the duration of the  Covid-19 declaration under Section 564(b)(1) of the Act, 21  U.S.C. section 360bbb-3(b)(1), unless the authorization is  terminated or revoked. Performed at [facility], [place] [address], [place], [place] [address]      Radiological Exams on Admission: DG Chest Port 1 View  Result Date: 03/15/2020 CLINICAL DATA:  Weakness and shortness of breath. EXAM: PORTABLE CHEST 1 VIEW COMPARISON:  January 20, 2018 FINDINGS: Multiple sternal wires and vascular clips are seen. Mild, chronic diffusely increased interstitial lung markings are noted. Mild to moderate severity areas of atelectasis and/or infiltrate are seen within the bilateral lung bases. Small bilateral pleural  effusions are seen. No pneumothorax is identified. The cardiac silhouette  is moderately enlarged. The visualized skeletal structures are unremarkable. IMPRESSION: 1. Mild to moderate severity bibasilar atelectasis and/or infiltrate. 2. Small bilateral pleural effusions. 3. Chronic interstitial lung disease. 4. Moderate cardiomegaly. Electronically Signed   By: Virgina Norfolk M.D.   On: 04/13/2020 21:22    EKG: Independently reviewed.  Shows sinus rhythm with a rate of 80, right bundle branch block and left ventricular hypertrophy.  Some Q waves in the lateral leads.  Assessment/Plan Principal Problem:   Acute respiratory failure due to COVID-19 [facility]) Active Problems:   Hypertension   Peripheral vascular disease (HCC)   Chronic combined systolic and diastolic heart failure, NYHA class 2 (HCC)   DVT (deep venous thrombosis) (HCC)   Diabetes mellitus type 2, insulin dependent (HCC)   Diffuse interstitial pulmonary disease (HCC)   AF (paroxysmal atrial fibrillation) (HCC)   CKD (chronic kidney disease), stage IV (HCC)     #1 acute respiratory failure due to COVID-19 pneumonia: Patient is currently requiring oxygen.  We will admit to telemetry.  Initiate remdesivir with document there are a breathing treatments.  Follow CRP and consider IL-6 inhibitors.  Follow protocol.  Follow daily labs and continue oxygen.  #2 acute exacerbation of systolic CHF: Last echocardiogram was in August of this year showing improved EF to 45 to 50%.  Continue cardiac medicines and diuresis with IV Lasix.  #3 atrial fibrillation: Paroxysmal in nature.  Has been on Eliquis.  We will continue with Eliquis.  #4 interstitial lung disease: Continue with oxygen and supportive care.  #5 history of DVT: Continue Eliquis  #6 peripheral vascular disease: Continue home regimen and supportive care.  #7 diabetes: Initiate sliding scale insulin as some home regimen.   DVT prophylaxis: Eliquis Code Status: Full  code Family Communication: Wife at bedside Disposition Plan: Home Consults called: None Admission status: Inpatient  Severity of Illness: The appropriate patient status for this patient is INPATIENT. Inpatient status is judged to be reasonable and necessary in order to provide the required intensity of service to ensure the patient's safety. The patient's presenting symptoms, physical exam findings, and initial radiographic and laboratory data in the context of their chronic comorbidities is felt to place them at high risk for further clinical deterioration. Furthermore, it is not anticipated that the patient will be medically stable for discharge from the hospital within 2 midnights of admission. The following factors support the patient status of inpatient.   " The patient's presenting symptoms include shortness of breath and cough. " The worrisome physical exam findings include bilateral crackles and hypoxia. " The initial radiographic and laboratory data are worrisome because of bilateral infiltrates and COVID-19 positive. " The chronic co-morbidities include esophageal cancer and ischemic cardiomyopathy.   * I certify that at the point of admission it is my clinical judgment that the patient will require inpatient hospital care spanning beyond 2 midnights from the point of admission due to high intensity of service, high risk for further deterioration and high frequency of surveillance required.Barbette Merino MD [facility] Pager [phone]  If 7PM-7AM, please contact night-coverage [email or web address] Password [email or web address]  03/30/2020, 11:25 PM

## 2020-04-15 DIAGNOSIS — I5042 Chronic combined systolic (congestive) and diastolic (congestive) heart failure: Secondary | ICD-10-CM

## 2020-04-15 DIAGNOSIS — E119 Type 2 diabetes mellitus without complications: Secondary | ICD-10-CM | POA: Diagnosis not present

## 2020-04-15 DIAGNOSIS — I480 Paroxysmal atrial fibrillation: Secondary | ICD-10-CM

## 2020-04-15 DIAGNOSIS — U071 COVID-19: Secondary | ICD-10-CM | POA: Diagnosis not present

## 2020-04-15 DIAGNOSIS — Z794 Long term (current) use of insulin: Secondary | ICD-10-CM

## 2020-04-15 LAB — COMPREHENSIVE METABOLIC PANEL
ALT: 15 U/L (ref 0–44)
AST: 30 U/L (ref 15–41)
Albumin: 2.7 g/dL — ABNORMAL LOW (ref 3.5–5.0)
Alkaline Phosphatase: 68 U/L (ref 38–126)
Anion gap: 11 (ref 5–15)
BUN: 45 mg/dL — ABNORMAL HIGH (ref 8–23)
CO2: 23 mmol/L (ref 22–32)
Calcium: 8.6 mg/dL — ABNORMAL LOW (ref 8.9–10.3)
Chloride: 109 mmol/L (ref 98–111)
Creatinine, Ser: 2.93 mg/dL — ABNORMAL HIGH (ref 0.61–1.24)
GFR, Estimated: 22 mL/min — ABNORMAL LOW (ref >60)
Glucose, Bld: 225 mg/dL — ABNORMAL HIGH (ref 70–99)
Potassium: 4.3 mmol/L (ref 3.5–5.1)
Sodium: 143 mmol/L (ref 135–145)
Total Bilirubin: 0.9 mg/dL (ref 0.3–1.2)
Total Protein: 6.3 g/dL — ABNORMAL LOW (ref 6.5–8.1)

## 2020-04-15 LAB — CBC WITH DIFFERENTIAL/PLATELET
Abs Immature Granulocytes: 0.05 10*3/uL (ref 0.00–0.07)
Basophils Absolute: 0 10*3/uL (ref 0.0–0.1)
Basophils Relative: 0 %
Eosinophils Absolute: 0 10*3/uL (ref 0.0–0.5)
Eosinophils Relative: 0 %
HCT: 40.2 % (ref 39.0–52.0)
Hemoglobin: 12.4 g/dL — ABNORMAL LOW (ref 13.0–17.0)
Immature Granulocytes: 1 %
Lymphocytes Relative: 11 %
Lymphs Abs: 0.8 10*3/uL (ref 0.7–4.0)
MCH: 25.8 pg — ABNORMAL LOW (ref 26.0–34.0)
MCHC: 30.8 g/dL (ref 30.0–36.0)
MCV: 83.6 fL (ref 80.0–100.0)
Monocytes Absolute: 0.3 10*3/uL (ref 0.1–1.0)
Monocytes Relative: 4 %
Neutro Abs: 6 10*3/uL (ref 1.7–7.7)
Neutrophils Relative %: 84 %
Platelets: 255 10*3/uL (ref 150–400)
RBC: 4.81 MIL/uL (ref 4.22–5.81)
RDW: 17.4 % — ABNORMAL HIGH (ref 11.5–15.5)
WBC: 7.2 10*3/uL (ref 4.0–10.5)
nRBC: 0 % (ref 0.0–0.2)

## 2020-04-15 LAB — D-DIMER, QUANTITATIVE: D-Dimer, Quant: 1.03 ug/mL-FEU — ABNORMAL HIGH (ref 0.00–0.50)

## 2020-04-15 LAB — LACTIC ACID, PLASMA: Lactic Acid, Venous: 1.5 mmol/L (ref 0.5–1.9)

## 2020-04-15 LAB — FERRITIN: Ferritin: 436 ng/mL — ABNORMAL HIGH (ref 24–336)

## 2020-04-15 LAB — PROCALCITONIN: Procalcitonin: <0.1 ng/mL

## 2020-04-15 LAB — GLUCOSE, CAPILLARY
Glucose-Capillary: 222 mg/dL — ABNORMAL HIGH (ref 70–99)
Glucose-Capillary: 282 mg/dL — ABNORMAL HIGH (ref 70–99)
Glucose-Capillary: 305 mg/dL — ABNORMAL HIGH (ref 70–99)
Glucose-Capillary: 200 mg/dL — ABNORMAL HIGH (ref 70–99)

## 2020-04-15 LAB — HEMOGLOBIN A1C
Hgb A1c MFr Bld: 7 % — ABNORMAL HIGH (ref 4.8–5.6)
Mean Plasma Glucose: 154.2 mg/dL

## 2020-04-15 LAB — C-REACTIVE PROTEIN: CRP: 14.3 mg/dL — ABNORMAL HIGH (ref <1.0)

## 2020-04-15 LAB — MAGNESIUM: Magnesium: 2 mg/dL (ref 1.7–2.4)

## 2020-04-15 LAB — PHOSPHORUS: Phosphorus: 2.8 mg/dL (ref 2.5–4.6)

## 2020-04-15 MED ORDER — METOPROLOL TARTRATE 5 MG/5ML IV SOLN
5.0000 mg | Freq: Once | INTRAVENOUS | Status: AC
  Administered 2020-04-15: 5 mg via INTRAVENOUS
  Filled 2020-04-15: qty 5

## 2020-04-15 MED ORDER — INSULIN GLARGINE 100 UNIT/ML ~~LOC~~ SOLN
35.0000 [IU] | Freq: Every day | SUBCUTANEOUS | Status: DC
  Administered 2020-04-15 – 2020-04-17 (×3): 35 [IU] via SUBCUTANEOUS
  Filled 2020-04-15 (×3): qty 0.35

## 2020-04-15 MED ORDER — INSULIN ASPART 100 UNIT/ML ~~LOC~~ SOLN
0.0000 [IU] | Freq: Every day | SUBCUTANEOUS | Status: DC
  Administered 2020-04-15: 0 [IU] via SUBCUTANEOUS
  Administered 2020-04-16: 2 [IU] via SUBCUTANEOUS
  Administered 2020-04-17: 4 [IU] via SUBCUTANEOUS
  Administered 2020-04-18: 3 [IU] via SUBCUTANEOUS

## 2020-04-15 MED ORDER — INSULIN ASPART 100 UNIT/ML ~~LOC~~ SOLN
0.0000 [IU] | Freq: Three times a day (TID) | SUBCUTANEOUS | Status: DC
  Administered 2020-04-15: 5 [IU] via SUBCUTANEOUS
  Administered 2020-04-15: 11 [IU] via SUBCUTANEOUS
  Administered 2020-04-15: 8 [IU] via SUBCUTANEOUS
  Administered 2020-04-16: 3 [IU] via SUBCUTANEOUS
  Administered 2020-04-16: 5 [IU] via SUBCUTANEOUS
  Administered 2020-04-16: 8 [IU] via SUBCUTANEOUS
  Administered 2020-04-17: 11 [IU] via SUBCUTANEOUS
  Administered 2020-04-17: 3 [IU] via SUBCUTANEOUS
  Administered 2020-04-17: 11 [IU] via SUBCUTANEOUS
  Administered 2020-04-18 (×2): 3 [IU] via SUBCUTANEOUS
  Administered 2020-04-18: 11 [IU] via SUBCUTANEOUS
  Administered 2020-04-19 (×2): 2 [IU] via SUBCUTANEOUS
  Administered 2020-04-20: 8 [IU] via SUBCUTANEOUS

## 2020-04-15 MED ORDER — PANTOPRAZOLE SODIUM 40 MG PO TBEC
40.0000 mg | DELAYED_RELEASE_TABLET | Freq: Every day | ORAL | Status: DC
  Administered 2020-04-15 – 2020-04-19 (×5): 40 mg via ORAL
  Filled 2020-04-15 (×5): qty 1

## 2020-04-15 MED ORDER — GABAPENTIN 300 MG PO CAPS
300.0000 mg | ORAL_CAPSULE | Freq: Two times a day (BID) | ORAL | Status: DC
  Administered 2020-04-15 – 2020-04-19 (×9): 300 mg via ORAL
  Filled 2020-04-15 (×9): qty 1

## 2020-04-15 MED ORDER — AMIODARONE HCL 200 MG PO TABS
200.0000 mg | ORAL_TABLET | Freq: Every day | ORAL | Status: DC
  Administered 2020-04-15 – 2020-04-19 (×5): 200 mg via ORAL
  Filled 2020-04-15 (×5): qty 1

## 2020-04-15 MED ORDER — NITROGLYCERIN 0.4 MG SL SUBL
0.4000 mg | SUBLINGUAL_TABLET | SUBLINGUAL | Status: DC | PRN
  Filled 2020-04-15: qty 1

## 2020-04-15 MED ORDER — INSULIN ASPART 100 UNIT/ML ~~LOC~~ SOLN
4.0000 [IU] | Freq: Three times a day (TID) | SUBCUTANEOUS | Status: DC
  Administered 2020-04-15 – 2020-04-17 (×6): 4 [IU] via SUBCUTANEOUS

## 2020-04-15 MED ORDER — ROSUVASTATIN CALCIUM 20 MG PO TABS
40.0000 mg | ORAL_TABLET | Freq: Every day | ORAL | Status: DC
  Administered 2020-04-15 – 2020-04-19 (×5): 40 mg via ORAL
  Filled 2020-04-15 (×5): qty 2

## 2020-04-15 MED ORDER — METHYLPREDNISOLONE SODIUM SUCC 40 MG IJ SOLR
40.0000 mg | Freq: Two times a day (BID) | INTRAMUSCULAR | Status: DC
  Administered 2020-04-15 – 2020-04-16 (×3): 40 mg via INTRAVENOUS
  Filled 2020-04-15 (×3): qty 1

## 2020-04-15 NOTE — Progress Notes (Signed)
Regarding Sepsis Protocol, Dr Jonelle Sidle has noted secondary sepsis with regards to + Covid and fluid overload, Lactic was 2.0 earlier, VSS, Abx and 500cc fluids given, orders for tele

## 2020-04-15 NOTE — Progress Notes (Addendum)
PROGRESS NOTE                                                                                                                                                                                                             Patient Demographics:    Justin Blackburn, is a 69 y.o. male, DOB - 1950-07-21, TDD:[id number]  Outpatient Primary MD for the patient is [name], Hershal Coria   Admit date - 04/13/2020   LOS - 1  Chief Complaint  Patient presents with  . Shortness of Breath       Brief Narrative: Patient is a 69 y.o. male with PMHx of PAF, CKD stage IIIa, HTN, HLD, PAF, chronic combined systolic and diastolic heart failure, DM-2, CKD stage IIIa, VTE, HLD, HTN, history of adenocarcinoma of the GE junction-s/p radiation/chemo (completed therapy on 12/11/2017)-currently on the active surveillance-presenting with fever, shortness of breath, fatigue-found to have acute hypoxic respiratory failure due to COVID-19 pneumonia  COVID-19 vaccinated status: 9/29>> Pfizer x1st dose  10/20>> Pfizer x 2nd dose  Significant Events: 10/31>> Admit to [facility] for hypoxia due to COVID-19 pneumonia  Significant studies: 10/31>>Chest x-ray: Bibasilar atelectasis/infiltrate, chronic interstitial lung disease 10/18>> CT abdomen/pelvis: No evidence of esophageal carcinoma recurrence  COVID-19 medications: Steroids: 10/31>> Remdesivir: 10/31>>  Antibiotics: Rocephin: 10/31 Zithromax: 10/31  Microbiology data: 10/31 >>blood culture: No growth 10/31>> urine culture: Pending  Procedures: None  Consults: None  DVT prophylaxis: apixaban (ELIQUIS) tablet 5 mg     Subjective:    Justin Blackburn today appears stable on 2 L of oxygen-no shortness of breath at rest.   Assessment  & Plan :   Acute Hypoxic Resp Failure due to Covid 19 Viral pneumonia: Appears to have mild hypoxemia-stable on 2 L of oxygen-continue steroids/Remdesivir.  Do not think  patient has bacterial pneumonia-procalcitonin is negative-suspect can discontinue Rocephin/Zithromax.  Follow closely attempt to titrate off FiO2 as tolerated.  Doubt patient has sepsis physiology-do not think it was present on admission-sepsis ruled out.  Fever: afebrile O2 requirements:  SpO2: 100 % O2 Flow Rate (L/min): 2 L/min   COVID-19 Labs: Recent Labs    04/15/20 0450  DDIMER 1.03*  FERRITIN 436*  CRP 14.3*       Component Value Date/Time   BNP 261.4 (H) 04/05/2020 2100    Recent Labs  Lab 04/15/20 0450  PROCALCITON <  0.10    Lab Results  Component Value Date   SARSCOV2NAA POSITIVE (A) 04/07/2020     Prone/Incentive Spirometry: encouraged  incentive spirometry use 3-4/hour.    AKI on CKD stage IIIa: Likely hemodynamically mediated-hold diuretics-volume status stable-decrease IV fluids to 40 cc an hour for another 12 hours.  Avoid nephrotoxic agents-repeat electrolytes tomorrow.  Has significant proteinuria-likely secondary to diabetes/COVID-19 pneumonia-May need to repeat UA once patient is acute infection is over.  Chronic combined systolic/diastolic heart failure: No evidence of decompensation on exam-volume status is remarkably stable-hold diuretics for now.  Reassess volume status daily and resume accordingly.  PAF: Appears to be in sinus rhythm-resume amiodarone-remains on Eliquis-Per prior documentation cardiology-not on beta-blocker due to bradycardia.  CAD s/p CABG: No anginal symptoms-continue statin-suspect not on aspirin as patient on Eliquis.  HLD: On statin  History of esophageal cancer: Resume outpatient follow-up with oncology-completed treatment-on active surveillance.  GERD: Continue PPI  DM-2 with uncontrolled hyperglycemia due to steroids: CBGs on the higher side-on [place] at [employer] Lantus 25 units daily, 4 units of NovoLog with meals and SSI.  Follow and adjust.  Recent Labs    04/15/20 0716 04/15/20 1150  GLUCAP 222* 282*    History of VTE: On anticoagulation.   GI prophylaxis: PP  ABG:    Component Value Date/Time   PHART 7.498 (H) 01/17/2018 1735   PCO2ART 43.3 01/17/2018 1735   PO2ART 57.5 (L) 01/17/2018 1735   HCO3 24.1 03/31/2020 2135   TCO2 25 04/01/2020 2135   ACIDBASEDEF 2.0 04/10/2020 2135   O2SAT 91.0 03/31/2020 2135    Vent Settings: N/A  Condition -  Guarded  Family Communication  :  Spouse- Mardene Celeste Heslop-[phone] updated over the phone on 11/1  Code Status :  Full Code  Diet :  Diet Order            Diet heart healthy/carb modified Room service appropriate? Yes; Fluid consistency: Thin  Diet effective now                  Disposition Plan  :   Status is: Inpatient  Remains inpatient appropriate because:Inpatient level of care appropriate due to severity of illness   Dispo: The patient is from: Home              Anticipated d/c is to: Home              Anticipated d/c date is: 3 days              Patient currently is not medically stable to d/c.   Barriers to discharge: Hypoxia requiring O2 supplementation/complete 5 days of IV Remdesivir  Antimicorbials  :    Anti-infectives (From admission, onward)   Start     Dose/Rate Route Frequency Ordered Stop   04/16/20 1000  remdesivir 100 mg in sodium chloride 0.9 % 100 mL IVPB        100 mg 200 mL/hr over 30 Minutes Intravenous Daily 03/28/2020 2332 04/20/20 0959   04/15/20 1000  remdesivir 100 mg in sodium chloride 0.9 % 100 mL IVPB  Status:  Discontinued       "Followed by" Linked Group Details   100 mg 200 mL/hr over 30 Minutes Intravenous Daily 04/07/2020 2333 03/25/2020 2345   04/11/2020 2345  remdesivir 200 mg in sodium chloride 0.9% 250 mL IVPB        200 mg 580 mL/hr over 30 Minutes Intravenous Once 04/05/2020 2331 04/15/20 0834   04/04/2020 2333  remdesivir 200 mg in sodium chloride 0.9% 250 mL IVPB  Status:  Discontinued       "Followed by" Linked Group Details   200 mg 580 mL/hr over 30 Minutes Intravenous  Once 03/23/2020 2333 03/15/2020 2345   03/24/2020 2130  cefTRIAXone (ROCEPHIN) 2 g in sodium chloride 0.9 % 100 mL IVPB        2 g 200 mL/hr over 30 Minutes Intravenous Every 24 hours 03/24/2020 2120 04/18/20 2359   04/05/2020 2130  azithromycin (ZITHROMAX) 500 mg in sodium chloride 0.9 % 250 mL IVPB        500 mg 250 mL/hr over 60 Minutes Intravenous Every 24 hours 04/06/2020 2120 04/18/20 2359      Inpatient Medications  Scheduled Meds: . apixaban  5 mg Oral BID  . insulin aspart  0-15 Units Subcutaneous TID WC  . insulin aspart  0-5 Units Subcutaneous QHS  . Ipratropium-Albuterol  1 puff Inhalation Q6H  . methylPREDNISolone (SOLU-MEDROL) injection  40 mg Intravenous Q12H  . sodium chloride flush  3 mL Intravenous Q12H   Continuous Infusions: . sodium chloride    . azithromycin Stopped (03/21/2020 2255)  . cefTRIAXone (ROCEPHIN)  IV Stopped (03/17/2020 2224)  . lactated ringers    . lactated ringers    . [START ON 04/16/2020] remdesivir 100 mg in NS 100 mL     PRN Meds:.sodium chloride, ondansetron **OR** ondansetron (ZOFRAN) IV, sodium chloride flush   Time Spent in minutes  25  See all Orders from today for further details   Oren Binet M.D on 04/15/2020 at 11:33 AM  To page go to [email or web address] - use universal password  [facility] -  Office  [phone]    Objective:   Vitals:   04/15/20 0208 04/15/20 0405 04/15/20 0613 04/15/20 0719  BP: (!) 177/86 (!) 155/77  (!) 165/73  Pulse: 72 63  60  Resp: (!) 22 (!) 24  20  Temp: 98.3 F (36.8 C) 97.8 F (36.6 C)  98.4 F (36.9 C)  TempSrc: Oral Oral  Oral  SpO2: 97% 93% 96% 100%  Weight:      Height:        Wt Readings from Last 3 Encounters:  03/15/2020 94.3 kg  03/14/20 94.6 kg  12/11/19 94.8 kg     Intake/Output Summary (Last 24 hours) at 04/15/2020 1133 Last data filed at 04/15/2020 0930 Gross per 24 hour  Intake 724.19 ml  Output 200 ml  Net 524.19 ml     Physical Exam Gen Exam:Alert awake-not in  any distress HEENT:atraumatic, normocephalic Chest: B/L clear to auscultation anteriorly CVS:S1S2 regular Abdomen:soft non tender, non distended Extremities:no edema Neurology: Non focal Skin: no rash   Data Review:    CBC Recent Labs  Lab 04/08/2020 2100 04/01/2020 2135 04/15/20 0450  WBC 7.6  --  7.2  HGB 12.9* 13.3 12.4*  HCT 41.5 39.0 40.2  PLT 268  --  255  MCV 83.5  --  83.6  MCH 26.0  --  25.8*  MCHC 31.1  --  30.8  RDW 17.6*  --  17.4*  LYMPHSABS 1.1  --  0.8  MONOABS 0.6  --  0.3  EOSABS 0.0  --  0.0  BASOSABS 0.0  --  0.0    Chemistries  Recent Labs  Lab 03/16/2020 2100 04/09/2020 2135 04/15/20 0450  NA 143 145 143  K 4.0 4.0 4.3  CL 109  --  109  CO2 21*  --  23  GLUCOSE  160*  --  225*  BUN 47*  --  45*  CREATININE 3.09*  --  2.93*  CALCIUM 8.7*  --  8.6*  MG  --   --  2.0  AST 33  --  30  ALT 16  --  15  ALKPHOS 70  --  68  BILITOT 1.0  --  0.9   ------------------------------------------------------------------------------------------------------------------ No results for input(s): CHOL, HDL, LDLCALC, TRIG, CHOLHDL, LDLDIRECT in the last 72 hours.  Lab Results  Component Value Date   HGBA1C 7.0 (H) 04/15/2020   ------------------------------------------------------------------------------------------------------------------ No results for input(s): TSH, T4TOTAL, T3FREE, THYROIDAB in the last 72 hours.  Invalid input(s): FREET3 ------------------------------------------------------------------------------------------------------------------ Recent Labs    04/15/20 0450  FERRITIN 436*    Coagulation profile Recent Labs  Lab 04/08/2020 2119  INR 1.4*    Recent Labs    04/15/20 0450  DDIMER 1.03*    Cardiac Enzymes No results for input(s): CKMB, TROPONINI, MYOGLOBIN in the last 168 hours.  Invalid input(s): CK ------------------------------------------------------------------------------------------------------------------     Component Value Date/Time   BNP 261.4 (H) 03/19/2020 2100    Micro Results Recent Results (from the past 240 hour(s))  Respiratory Panel by RT PCR (Flu A&B, Covid) - Nasopharyngeal Swab     Status: Abnormal   Collection Time: 03/16/2020  9:00 PM   Specimen: Nasopharyngeal Swab  Result Value Ref Range Status   SARS Coronavirus 2 by RT PCR POSITIVE (A) NEGATIVE Final    Comment: RESULT CALLED TO, READ BACK BY AND VERIFIED WITH: RN C BLACK @2258  03/28/2020 BY S GEZAHEGN (NOTE) SARS-CoV-2 target nucleic acids are DETECTED.  SARS-CoV-2 RNA is generally detectable in upper respiratory specimens  during the acute phase of infection. Positive results are indicative of the presence of the identified virus, but do not rule out bacterial infection or co-infection with other pathogens not detected by the test. Clinical correlation with patient history and other diagnostic information is necessary to determine patient infection status. The expected result is Negative.  Fact Sheet for Patients:  PinkCheek.be  Fact Sheet for Healthcare Providers: GravelBags.it  This test is not yet approved or cleared by the [place] FDA and  has been authorized for detection and/or diagnosis of SARS-CoV-2 by FDA under an Emergency Use Authorization (EUA).  This EUA will remain in effect (meaning this test ca n be used) for the duration of  the COVID-19 declaration under Section 564(b)(1) of the Act, 21 U.S.C. section 360bbb-3(b)(1), unless the authorization is terminated or revoked sooner.      Influenza A by PCR NEGATIVE NEGATIVE Final   Influenza B by PCR NEGATIVE NEGATIVE Final    Comment: (NOTE) The Xpert Xpress SARS-CoV-2/FLU/RSV assay is intended as an aid in  the diagnosis of influenza from Nasopharyngeal swab specimens and  should not be used as a sole basis for treatment. Nasal washings and  aspirates are unacceptable for Xpert  Xpress SARS-CoV-2/FLU/RSV  testing.  Fact Sheet for Patients: PinkCheek.be  Fact Sheet for Healthcare Providers: GravelBags.it  This test is not yet approved or cleared by the [place] FDA and  has been authorized for detection and/or diagnosis of SARS-CoV-2 by  FDA under an Emergency Use Authorization (EUA). This EUA will remain  in effect (meaning this test can be used) for the duration of the  Covid-19 declaration under Section 564(b)(1) of the Act, 21  U.S.C. section 360bbb-3(b)(1), unless the authorization is  terminated or revoked. Performed at [facility], [place],  [place] [address]   Blood Culture (routine x 2)     Status: None (Preliminary result)   Collection Time: 04/06/2020  9:20 PM   Specimen: BLOOD  Result Value Ref Range Status   Specimen Description BLOOD LEFT ANTECUBITAL  Final   Special Requests   Final    BOTTLES DRAWN AEROBIC AND ANAEROBIC Blood Culture results may not be optimal due to an inadequate volume of blood received in culture bottles   Culture   Final    NO GROWTH < 12 HOURS Performed at [facility], [place] [address] [place], [place] [address]    Report Status PENDING  Incomplete  Blood Culture (routine x 2)     Status: None (Preliminary result)   Collection Time: 04/06/2020  9:31 PM   Specimen: BLOOD RIGHT FOREARM  Result Value Ref Range Status   Specimen Description BLOOD RIGHT FOREARM  Final   Special Requests   Final    BOTTLES DRAWN AEROBIC AND ANAEROBIC Blood Culture results may not be optimal due to an inadequate volume of blood received in culture bottles   Culture   Final    NO GROWTH < 12 HOURS Performed at [facility], [place] [address], [place],  [address]    Report Status PENDING  Incomplete    Radiology Reports CT ABDOMEN PELVIS WO CONTRAST  Result Date: 04/02/2020 CLINICAL DATA:  Esophageal cancer. Gastrointestinal cancer.  Chemo radiation completed. EXAM: CT ABDOMEN AND PELVIS WITHOUT CONTRAST TECHNIQUE: Multidetector CT imaging of the abdomen and pelvis was performed following the standard protocol without IV contrast. COMPARISON:  CT 09/26/2019 FINDINGS: Lower chest: Lung bases are clear. Hepatobiliary: No focal hepatic lesion. No biliary duct dilatation. Common bile duct is normal. Pancreas: Pancreas is normal. No ductal dilatation. No pancreatic inflammation. Spleen: Normal spleen Adrenals/urinary tract: Adrenal glands and kidneys are normal. The ureters and bladder normal. Stomach/Bowel: Coarse calcifications along the distal esophagus and GE junction unchanged from prior. No nodularity or mass. No gastrohepatic ligament adenopathy. Stomach, duodenum and small bowel are normal. Appendix cecum normal. The colon and rectosigmoid colon are normal. Vascular/Lymphatic: Abdominal aorta is normal caliber with atherosclerotic calcification. There is no retroperitoneal or periportal lymphadenopathy. No pelvic lymphadenopathy. Reproductive: Prostate normal Other: No free fluid. Musculoskeletal: Short expansile lesion of the RIGHT tenth rib is unchanged consistent with a healed fracture. IMPRESSION: 1. No evidence esophageal carcinoma recurrence. 2. Coarse calcifications through the GE junction not changed from prior. 3. No evidence of gastrohepatic ligament lymphadenopathy. 4. No evidence of liver metastasis on noncontrast exam. 5. Aortic Atherosclerosis (ICD10-I70.0). Electronically Signed   By: Suzy Bouchard M.D.   On: 04/02/2020 08:40   DG Chest Port 1 View  Result Date: 04/05/2020 CLINICAL DATA:  Weakness and shortness of breath. EXAM: PORTABLE CHEST 1 VIEW COMPARISON:  January 20, 2018 FINDINGS: Multiple sternal wires and vascular clips are seen. Mild, chronic diffusely increased interstitial lung markings are noted. Mild to moderate severity areas of atelectasis and/or infiltrate are seen within the bilateral lung bases. Small  bilateral pleural effusions are seen. No pneumothorax is identified. The cardiac silhouette is moderately enlarged. The visualized skeletal structures are unremarkable. IMPRESSION: 1. Mild to moderate severity bibasilar atelectasis and/or infiltrate. 2. Small bilateral pleural effusions. 3. Chronic interstitial lung disease. 4. Moderate cardiomegaly. Electronically Signed   By: Virgina Norfolk M.D.   On: 04/08/2020 21:22

## 2020-04-15 NOTE — Progress Notes (Signed)
PT Cancellation Note  Patient Details Name: Justin Blackburn. MRN: [id number] DOB: Nov 28, 1950   Cancelled Treatment:    Reason Eval/Treat Not Completed: Other (comment) Pt reports he just got back in bed and does not want to get back up at this time. Spoke with RN who confirmed and reports pt moves fairly well but get SHOB.  Will f/u at later date. Abran Richard, PT Acute Rehab Services Pager [phone] [facility] [place] 04/15/2020, 3:41 PM

## 2020-04-15 NOTE — Progress Notes (Signed)
Following Pt as a Sepsis Protocol per Elink

## 2020-04-15 DEATH — deceased

## 2020-04-16 ENCOUNTER — Telehealth: Payer: Self-pay

## 2020-04-16 DIAGNOSIS — I5042 Chronic combined systolic (congestive) and diastolic (congestive) heart failure: Secondary | ICD-10-CM | POA: Diagnosis not present

## 2020-04-16 DIAGNOSIS — I480 Paroxysmal atrial fibrillation: Secondary | ICD-10-CM | POA: Diagnosis not present

## 2020-04-16 DIAGNOSIS — U071 COVID-19: Secondary | ICD-10-CM | POA: Diagnosis not present

## 2020-04-16 DIAGNOSIS — E119 Type 2 diabetes mellitus without complications: Secondary | ICD-10-CM | POA: Diagnosis not present

## 2020-04-16 LAB — COMPREHENSIVE METABOLIC PANEL
ALT: 14 U/L (ref 0–44)
AST: 25 U/L (ref 15–41)
Albumin: 2.5 g/dL — ABNORMAL LOW (ref 3.5–5.0)
Alkaline Phosphatase: 65 U/L (ref 38–126)
Anion gap: 11 (ref 5–15)
BUN: 54 mg/dL — ABNORMAL HIGH (ref 8–23)
CO2: 19 mmol/L — ABNORMAL LOW (ref 22–32)
Calcium: 8.5 mg/dL — ABNORMAL LOW (ref 8.9–10.3)
Chloride: 109 mmol/L (ref 98–111)
Creatinine, Ser: 2.3 mg/dL — ABNORMAL HIGH (ref 0.61–1.24)
GFR, Estimated: 30 mL/min — ABNORMAL LOW (ref >60)
Glucose, Bld: 169 mg/dL — ABNORMAL HIGH (ref 70–99)
Potassium: 3.9 mmol/L (ref 3.5–5.1)
Sodium: 139 mmol/L (ref 135–145)
Total Bilirubin: 0.6 mg/dL (ref 0.3–1.2)
Total Protein: 5.7 g/dL — ABNORMAL LOW (ref 6.5–8.1)

## 2020-04-16 LAB — CBC WITH DIFFERENTIAL/PLATELET
Abs Immature Granulocytes: 0.05 10*3/uL (ref 0.00–0.07)
Basophils Absolute: 0 10*3/uL (ref 0.0–0.1)
Basophils Relative: 0 %
Eosinophils Absolute: 0 10*3/uL (ref 0.0–0.5)
Eosinophils Relative: 0 %
HCT: 36.8 % — ABNORMAL LOW (ref 39.0–52.0)
Hemoglobin: 11.7 g/dL — ABNORMAL LOW (ref 13.0–17.0)
Immature Granulocytes: 1 %
Lymphocytes Relative: 8 %
Lymphs Abs: 0.7 10*3/uL (ref 0.7–4.0)
MCH: 25.7 pg — ABNORMAL LOW (ref 26.0–34.0)
MCHC: 31.8 g/dL (ref 30.0–36.0)
MCV: 80.9 fL (ref 80.0–100.0)
Monocytes Absolute: 0.4 10*3/uL (ref 0.1–1.0)
Monocytes Relative: 5 %
Neutro Abs: 6.8 10*3/uL (ref 1.7–7.7)
Neutrophils Relative %: 86 %
Platelets: 288 10*3/uL (ref 150–400)
RBC: 4.55 MIL/uL (ref 4.22–5.81)
RDW: 17.3 % — ABNORMAL HIGH (ref 11.5–15.5)
WBC: 8 10*3/uL (ref 4.0–10.5)
nRBC: 0 % (ref 0.0–0.2)

## 2020-04-16 LAB — GLUCOSE, CAPILLARY
Glucose-Capillary: 182 mg/dL — ABNORMAL HIGH (ref 70–99)
Glucose-Capillary: 224 mg/dL — ABNORMAL HIGH (ref 70–99)
Glucose-Capillary: 278 mg/dL — ABNORMAL HIGH (ref 70–99)
Glucose-Capillary: 249 mg/dL — ABNORMAL HIGH (ref 70–99)

## 2020-04-16 LAB — C-REACTIVE PROTEIN: CRP: 9.1 mg/dL — ABNORMAL HIGH (ref <1.0)

## 2020-04-16 LAB — FERRITIN: Ferritin: 416 ng/mL — ABNORMAL HIGH (ref 24–336)

## 2020-04-16 LAB — D-DIMER, QUANTITATIVE: D-Dimer, Quant: 0.99 ug/mL-FEU — ABNORMAL HIGH (ref 0.00–0.50)

## 2020-04-16 LAB — URINE CULTURE: Culture: <10000 — AB

## 2020-04-16 LAB — MAGNESIUM: Magnesium: 2 mg/dL (ref 1.7–2.4)

## 2020-04-16 MED ORDER — IPRATROPIUM-ALBUTEROL 20-100 MCG/ACT IN AERS
1.0000 | INHALATION_SPRAY | Freq: Four times a day (QID) | RESPIRATORY_TRACT | Status: DC | PRN
  Filled 2020-04-16: qty 4

## 2020-04-16 MED ORDER — IPRATROPIUM-ALBUTEROL 20-100 MCG/ACT IN AERS
1.0000 | INHALATION_SPRAY | Freq: Two times a day (BID) | RESPIRATORY_TRACT | Status: DC
  Administered 2020-04-16 – 2020-04-24 (×10): 1 via RESPIRATORY_TRACT
  Filled 2020-04-16 (×2): qty 4

## 2020-04-16 MED ORDER — METHYLPREDNISOLONE SODIUM SUCC 125 MG IJ SOLR
50.0000 mg | Freq: Two times a day (BID) | INTRAMUSCULAR | Status: DC
  Administered 2020-04-16 – 2020-04-22 (×11): 50 mg via INTRAVENOUS
  Filled 2020-04-16 (×11): qty 2

## 2020-04-16 MED ORDER — IPRATROPIUM-ALBUTEROL 20-100 MCG/ACT IN AERS: 1.0000 | INHALATION_SPRAY | Freq: Two times a day (BID) | RESPIRATORY_TRACT | Status: DC

## 2020-04-16 MED ORDER — IPRATROPIUM-ALBUTEROL 20-100 MCG/ACT IN AERS: 1.0000 | INHALATION_SPRAY | Freq: Four times a day (QID) | RESPIRATORY_TRACT | Status: DC | PRN

## 2020-04-16 NOTE — TOC Initial Note (Signed)
Transition of Care (TOC) - Initial/Assessment Note    Patient Details  Name: Justin Blackburn. MRN: [id number] Date of Birth: December 06, 1950  Transition of Care [facility]) CM/SW Contact:    Carles Collet, RN Phone Number: 04/16/2020, 3:08 PM  Clinical Narrative:      Damaris Schooner w patient over the phone. He confirms that he lives at home w his wife, wants to return at DC. He states that he has RW and cane at home. We discussed [place] services. At this time he is declining HH. We discussed his increased O2 demand, I encouraged him to discuss [facility] with his wife and reconsider. CM will follow back up.            Expected Discharge Plan: [place] Barriers to Discharge: Continued Medical Work up   Patient Goals and CMS Choice Patient states their goals for this hospitalization and ongoing recovery are:: to go home CMS Medicare.gov Compare Post Acute Care list provided to:: Patient Choice offered to / list presented to : Patient  Expected Discharge Plan and Services Expected Discharge Plan: [place]   Discharge Planning Services: CM Consult   Living arrangements for the past 2 months: Single Family Home                                      Prior Living Arrangements/Services Living arrangements for the past 2 months: Single Family Home Lives with:: Spouse                   Activities of Daily Living Home Assistive Devices/Equipment: Cane (specify quad or straight) ADL Screening (condition at time of admission) Patient's cognitive ability adequate to safely complete daily activities?: Yes Is the patient deaf or have difficulty hearing?: No Does the patient have difficulty seeing, even when wearing glasses/contacts?: No Does the patient have difficulty concentrating, remembering, or making decisions?: No Patient able to express need for assistance with ADLs?: Yes Does the patient have difficulty dressing or bathing?: No Independently performs ADLs?:  Yes (appropriate for developmental age) Does the patient have difficulty walking or climbing stairs?: No Weakness of Legs: None Weakness of Arms/Hands: None  Permission Sought/Granted                  Emotional Assessment              Admission diagnosis:  Acute hypoxemic respiratory failure ([place]) [J96.01] Acute respiratory failure due to COVID-19 ([place]) [U07.1, J96.00] Pneumonia due to severe acute respiratory syndrome coronavirus 2 (SARS-CoV-2) [U07.1, J12.82] Patient Active Problem List   Diagnosis Date Noted  . Acute respiratory failure due to COVID-19 ([place]) 03/20/2020  . CKD (chronic kidney disease), stage IV ([place]) 03/09/2020  . AF (paroxysmal atrial fibrillation) ([place])   . Steroid-induced hyperglycemia   . Supplemental oxygen dependent   . Diffuse interstitial pulmonary disease ([place])   . Microcytic anemia 01/16/2018  . History of DVT (deep vein thrombosis) 01/15/2018  . Diabetes mellitus type 2, insulin dependent ([place])   . Esophageal cancer ([place]) 12/20/2017  . DVT (deep venous thrombosis) ([place]) 12/07/2017  . Atrial fibrillation, persistent ([place]) 12/06/2017  . Encounter for antineoplastic chemotherapy 11/11/2017  . Malignant neoplasm of distal third of esophagus ([place]) 09/04/2017  . Atrial flutter ([place]) 09/01/2017  . Iron deficiency anemia   . Coronary artery disease involving coronary bypass graft of native heart without angina  pectoris 08/28/2016  . 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 Anticoagulated 02/06/2015  . Chronic combined systolic and diastolic heart failure, NYHA class 2 ([place]) 04/17/2013  . Umbilical hernia   . NSTEMI (non-ST elevated myocardial infarction) ([place])   . Peripheral vascular disease ([place])   . Hypercholesteremia   . Hypertension 09/01/2011   PCP:  [name], PA-C Pharmacy:   [id number] - [place], [place] [address] [place] [address] Phone: [phone]  Fax: [phone]     Social Determinants of Health (SDOH) Interventions    Readmission Risk Interventions No flowsheet data found.

## 2020-04-16 NOTE — Progress Notes (Signed)
PROGRESS NOTE                                                                                                                                                                                                             Patient Demographics:    Justin Blackburn, is a 69 y.o. male, DOB - Oct 21, 1950, TDV:[id number]  Outpatient Primary MD for the patient is Justin Blackburn, Justin Blackburn   Admit date - 03/15/2020   LOS - 2  Chief Complaint  Patient presents with  . Shortness of Breath       Brief Narrative: Patient is a 70 y.o. male with PMHx of PAF, CKD stage IIIa, HTN, HLD, PAF, chronic combined systolic and diastolic heart failure, DM-2, CKD stage IIIa, VTE, HLD, HTN, history of adenocarcinoma of the GE junction-s/p radiation/chemo (completed therapy on 12/11/2017)-currently on the active surveillance-presenting with fever, shortness of breath, fatigue-found to have acute hypoxic respiratory failure due to COVID-19 pneumonia  COVID-19 vaccinated status: 9/29>> Pfizer x1st dose  10/20>> Pfizer x 2nd dose  Significant Events: 10/31>> Admit to [facility] for hypoxia due to COVID-19 pneumonia  Significant studies: 10/31>>Chest x-ray: Bibasilar atelectasis/infiltrate, chronic interstitial lung disease 10/18>> CT abdomen/pelvis: No evidence of esophageal carcinoma recurrence  COVID-19 medications: Steroids: 10/31>> Remdesivir: 10/31>>  Antibiotics: Rocephin: 10/31 Zithromax: 10/31  Microbiology data: 10/31 >>blood culture: No growth 10/31>> urine culture: < 10,000 colonies/mL insignificant growth  Procedures: None  Consults: None  DVT prophylaxis: apixaban (ELIQUIS) tablet 5 mg     Subjective:   He was sitting at bedside chair-denies any shortness of breath.  Per nursing staff-did not get short of breath and require more O2-up to 6 L when he ambulated.  However when he stabilized-he was back down to 4 L.   Assessment  & Plan :     Acute Hypoxic Resp Failure due to Covid 19 Viral pneumonia: Appears to have some mild worsening of his hypoxemia-O2 requirements up to 4 L at rest-Per nursing staff requires around 6-8 L of oxygen with ambulation.  He appears comfortable-inflammatory markers downtrending-continue steroids/remdesivir.  If his hypoxia worsens-he has consented to the use of Actemra or baricitinib  Rationale/risk/benefits regarding use of baricitinib/Actemra discussed in detail-he has no history of tuberculosis, hepatitis B, diverticulitis.  Understands that these agents are on the EUA by FDA.  If his hypoxia worsens-he has consented  to the use of these agents.  Doubt patient has sepsis physiology-do not think it was present on admission-sepsis ruled out.  Fever: afebrile O2 requirements:  SpO2: 96 % O2 Flow Rate (L/min): 4 L/min   COVID-19 Labs: Recent Labs    04/15/20 0450 04/16/20 0257  DDIMER 1.03* 0.99*  FERRITIN 436* 416*  CRP 14.3* 9.1*       Component Value Date/Time   BNP 261.4 (H) 04/01/2020 2100    Recent Labs  Lab 04/15/20 0450  PROCALCITON <0.10    Lab Results  Component Value Date   SARSCOV2NAA POSITIVE (A) 03/16/2020     Prone/Incentive Spirometry: encouraged  incentive spirometry use 3-4/hour.    AKI on CKD stage IIIa: Likely hemodynamically mediated-hold diuretics-volume status stable-no longer on IV fluids.  Continue to avoid nephrotoxic agents.Has significant proteinuria-likely secondary to diabetes/COVID-19 pneumonia-May need to repeat UA once patient is acute infection is over.  Chronic combined systolic/diastolic heart failure: No evidence of decompensation on exam-volume status is remarkably stable-hold diuretics for now.  Reassess volume status daily and resume accordingly.  PAF: Appears to be in sinus rhythm-resume amiodarone-remains on Eliquis-Per prior documentation cardiology-not on beta-blocker due to bradycardia.  CAD s/p CABG: No anginal symptoms-continue  statin-suspect not on aspirin as patient on Eliquis.  HLD: On statin  History of esophageal cancer: Resume outpatient follow-up with oncology-completed treatment-on active surveillance.  CT abdomen/pelvis on 10/18 without any evidence of recurrence.  GERD: Continue PPI  DM-2 with uncontrolled hyperglycemia due to steroids: CBGs better controlled-continue Lantus 25 units daily, 4 units of NovoLog with meals and SSI.  Follow and adjust.   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   GI prophylaxis: PP  ABG:    Component Value Date/Time   PHART 7.498 (H) 01/17/2018 1735   PCO2ART 43.3 01/17/2018 1735   PO2ART 57.5 (L) 01/17/2018 1735   HCO3 24.1 04/06/2020 2135   TCO2 25 03/26/2020 2135   ACIDBASEDEF 2.0 04/07/2020 2135   O2SAT 91.0 04/09/2020 2135    Vent Settings: N/A  Condition -  Guarded  Family Communication  :  Spouse- Justin Blackburn-[phone] updated over the phone on 11/2  Code Status :  Full Code  Diet :  Diet Order            Diet heart healthy/carb modified Room service appropriate? Yes; Fluid consistency: Thin  Diet effective now                  Disposition Plan  :   Status is: Inpatient  Remains inpatient appropriate because:Inpatient level of care appropriate due to severity of illness   Dispo: The patient is from: Home              Anticipated d/c is to: Home              Anticipated d/c date is: 3 days              Patient currently is not medically stable to d/c.   Barriers to discharge: Hypoxia requiring O2 supplementation/complete 5 days of IV Remdesivir  Antimicorbials  :    Anti-infectives (From admission, onward)   Start     Dose/Rate Route Frequency Ordered Stop   04/16/20 1000  remdesivir 100 mg in sodium chloride 0.9 % 100 mL IVPB        100 mg 200 mL/hr over 30 Minutes Intravenous Daily 04/12/2020  2332 04/20/20 0959   04/15/20 1000  remdesivir  100 mg in sodium chloride 0.9 % 100 mL IVPB  Status:  Discontinued       "Followed by" Linked Group Details   100 mg 200 mL/hr over 30 Minutes Intravenous Daily 04/09/2020 2333 04/12/2020 2345   03/18/2020 2345  remdesivir 200 mg in sodium chloride 0.9% 250 mL IVPB        200 mg 580 mL/hr over 30 Minutes Intravenous Once 03/26/2020 2331 04/15/20 0834   04/08/2020 2333  remdesivir 200 mg in sodium chloride 0.9% 250 mL IVPB  Status:  Discontinued       "Followed by" Linked Group Details   200 mg 580 mL/hr over 30 Minutes Intravenous Once 04/13/2020 2333 04/13/2020 2345   04/04/2020 2130  cefTRIAXone (ROCEPHIN) 2 g in sodium chloride 0.9 % 100 mL IVPB  Status:  Discontinued        2 g 200 mL/hr over 30 Minutes Intravenous Every 24 hours 04/12/2020 2120 04/15/20 1147   03/21/2020 2130  azithromycin (ZITHROMAX) 500 mg in sodium chloride 0.9 % 250 mL IVPB  Status:  Discontinued        500 mg 250 mL/hr over 60 Minutes Intravenous Every 24 hours 03/17/2020 2120 04/15/20 1147      Inpatient Medications  Scheduled Meds: . amiodarone  200 mg Oral Daily  . apixaban  5 mg Oral BID  . gabapentin  300 mg Oral BID  . insulin aspart  0-15 Units Subcutaneous TID WC  . insulin aspart  0-5 Units Subcutaneous QHS  . insulin aspart  4 Units Subcutaneous TID WC  . insulin glargine  35 Units Subcutaneous Daily  . Ipratropium-Albuterol  1 puff Inhalation BID  . methylPREDNISolone (SOLU-MEDROL) injection  50 mg Intravenous Q12H  . pantoprazole  40 mg Oral Daily  . rosuvastatin  40 mg Oral Daily  . sodium chloride flush  3 mL Intravenous Q12H   Continuous Infusions: . sodium chloride    . remdesivir 100 mg in NS 100 mL 100 mg (04/16/20 1019)   PRN Meds:.sodium chloride, Ipratropium-Albuterol, nitroGLYCERIN, ondansetron **OR** ondansetron (ZOFRAN) IV, sodium chloride flush   Time Spent in minutes  25  See all Orders from today for further details   Oren Binet M.D on 04/16/2020 at 1:47 PM  To page go to  [email or web address] - use universal password  [facility] -  Office  [phone]    Objective:   Vitals:   04/16/20 0400 04/16/20 0800 04/16/20 1000 04/16/20 1047  BP: (!) 152/82     Pulse: 62 62 65 (!) 59  Resp: 20  (!) 22 (!) 22  Temp: 98.1 F (36.7 C)     TempSrc: Oral     SpO2: 90% 93% 90% 96%  Weight:      Height:        Wt Readings from Last 3 Encounters:  04/06/2020 94.3 kg  03/14/20 94.6 kg  12/11/19 94.8 kg     Intake/Output Summary (Last 24 hours) at 04/16/2020 1347 Last data filed at 04/15/2020 2012 Gross per 24 hour  Intake 240 ml  Output 350 ml  Net -110 ml     Physical Exam Gen Exam:Alert awake-not in any distress HEENT:atraumatic, normocephalic Chest: B/L clear to auscultation anteriorly CVS:S1S2 regular Abdomen:soft non tender, non distended Extremities:no edema Neurology: Non focal Skin: no rash   Data Review:    CBC Recent Labs  Lab 03/25/2020 2100 03/21/2020 2135 04/15/20 0450 04/16/20 0257  WBC 7.6  --  7.2 8.0  HGB 12.9* 13.3 12.4* 11.7*  HCT 41.5 39.0 40.2 36.8*  PLT 268  --  255 288  MCV 83.5  --  83.6 80.9  MCH 26.0  --  25.8* 25.7*  MCHC 31.1  --  30.8 31.8  RDW 17.6*  --  17.4* 17.3*  LYMPHSABS 1.1  --  0.8 0.7  MONOABS 0.6  --  0.3 0.4  EOSABS 0.0  --  0.0 0.0  BASOSABS 0.0  --  0.0 0.0    Chemistries  Recent Labs  Lab 03/18/2020 2100 03/21/2020 2135 04/15/20 0450 04/16/20 0257  NA 143 145 143 139  K 4.0 4.0 4.3 3.9  CL 109  --  109 109  CO2 21*  --  23 19*  GLUCOSE 160*  --  225* 169*  BUN 47*  --  45* 54*  CREATININE 3.09*  --  2.93* 2.30*  CALCIUM 8.7*  --  8.6* 8.5*  MG  --   --  2.0 2.0  AST 33  --  30 25  ALT 16  --  15 14  ALKPHOS 70  --  68 65  BILITOT 1.0  --  0.9 0.6   ------------------------------------------------------------------------------------------------------------------ No results for input(s): CHOL, HDL, LDLCALC, TRIG, CHOLHDL, LDLDIRECT in the last 72 hours.  Lab Results  Component  Value Date   HGBA1C 7.0 (H) 04/15/2020   ------------------------------------------------------------------------------------------------------------------ No results for input(s): TSH, T4TOTAL, T3FREE, THYROIDAB in the last 72 hours.  Invalid input(s): FREET3 ------------------------------------------------------------------------------------------------------------------ Recent Labs    04/15/20 0450 04/16/20 0257  FERRITIN 436* 416*    Coagulation profile Recent Labs  Lab 03/21/2020 2119  INR 1.4*    Recent Labs    04/15/20 0450 04/16/20 0257  DDIMER 1.03* 0.99*    Cardiac Enzymes No results for input(s): CKMB, TROPONINI, MYOGLOBIN in the last 168 hours.  Invalid input(s): CK ------------------------------------------------------------------------------------------------------------------    Component Value Date/Time   BNP 261.4 (H) 03/23/2020 2100    Micro Results Recent Results (from the past 240 hour(s))  Respiratory Panel by RT PCR (Flu A&B, Covid) - Nasopharyngeal Swab     Status: Abnormal   Collection Time: 04/11/2020  9:00 PM   Specimen: Nasopharyngeal Swab  Result Value Ref Range Status   SARS Coronavirus 2 by RT PCR POSITIVE (A) NEGATIVE Final    Comment: RESULT CALLED TO, READ BACK BY AND VERIFIED WITH: RN C BLACK @2258  03/23/2020 BY S GEZAHEGN (NOTE) SARS-CoV-2 target nucleic acids are DETECTED.  SARS-CoV-2 RNA is generally detectable in upper respiratory specimens  during the acute phase of infection. Positive results are indicative of the presence of the identified virus, but do not rule out bacterial infection or co-infection with other pathogens not detected by the test. Clinical correlation with patient history and other diagnostic information is necessary to determine patient infection status. The expected result is Negative.  Fact Sheet for Patients:  PinkCheek.be  Fact Sheet for Healthcare  Providers: GravelBags.it  This test is not yet approved or cleared by the [place] FDA and  has been authorized for detection and/or diagnosis of SARS-CoV-2 by FDA under an Emergency Use Authorization (EUA).  This EUA will remain in effect (meaning this test ca n be used) for the duration of  the COVID-19 declaration under Section 564(b)(1) of the Act, 21 U.S.C. section 360bbb-3(b)(1), unless the authorization is terminated or revoked sooner.      Influenza A by PCR NEGATIVE NEGATIVE Final   Influenza B by PCR NEGATIVE NEGATIVE Final  Comment: (NOTE) The Xpert Xpress SARS-CoV-2/FLU/RSV assay is intended as an aid in  the diagnosis of influenza from Nasopharyngeal swab specimens and  should not be used as a sole basis for treatment. Nasal washings and  aspirates are unacceptable for Xpert Xpress SARS-CoV-2/FLU/RSV  testing.  Fact Sheet for Patients: PinkCheek.be  Fact Sheet for Healthcare Providers: GravelBags.it  This test is not yet approved or cleared by the [place] FDA and  has been authorized for detection and/or diagnosis of SARS-CoV-2 by  FDA under an Emergency Use Authorization (EUA). This EUA will remain  in effect (meaning this test can be used) for the duration of the  Covid-19 declaration under Section 564(b)(1) of the Act, 21  U.S.C. section 360bbb-3(b)(1), unless the authorization is  terminated or revoked. Performed at [facility], [place] [address] [place], [place] [address]   Blood Culture (routine x 2)     Status: None (Preliminary result)   Collection Time: 03/26/2020  9:20 PM   Specimen: BLOOD  Result Value Ref Range Status   Specimen Description BLOOD LEFT ANTECUBITAL  Final   Special Requests   Final    BOTTLES DRAWN AEROBIC AND ANAEROBIC Blood Culture results may not be optimal due to an inadequate volume of blood received in culture bottles   Culture    Final    NO GROWTH 2 DAYS Performed at [facility], [place] [address], [place], [place] [address]    Report Status PENDING  Incomplete  Blood Culture (routine x 2)     Status: None (Preliminary result)   Collection Time: 04/03/2020  9:31 PM   Specimen: BLOOD RIGHT FOREARM  Result Value Ref Range Status   Specimen Description BLOOD RIGHT FOREARM  Final   Special Requests   Final    BOTTLES DRAWN AEROBIC AND ANAEROBIC Blood Culture results may not be optimal due to an inadequate volume of blood received in culture bottles   Culture   Final    NO GROWTH 2 DAYS Performed at [facility], [place] [address], [place],  [address]    Report Status PENDING  Incomplete  Urine culture     Status: Abnormal   Collection Time: 04/08/2020 10:20 PM   Specimen: Urine, Random  Result Value Ref Range Status   Specimen Description URINE, RANDOM  Final   Special Requests NONE  Final   Culture (A)  Final    <10,000 COLONIES/mL INSIGNIFICANT GROWTH Performed at [facility], [place] [address], [place],  [address]    Report Status 04/16/2020 FINAL  Final    Radiology Reports CT ABDOMEN PELVIS WO CONTRAST  Result Date: 04/02/2020 CLINICAL DATA:  Esophageal cancer. Gastrointestinal cancer. Chemo radiation completed. EXAM: CT ABDOMEN AND PELVIS WITHOUT CONTRAST TECHNIQUE: Multidetector CT imaging of the abdomen and pelvis was performed following the standard protocol without IV contrast. COMPARISON:  CT 09/26/2019 FINDINGS: Lower chest: Lung bases are clear. Hepatobiliary: No focal hepatic lesion. No biliary duct dilatation. Common bile duct is normal. Pancreas: Pancreas is normal. No ductal dilatation. No pancreatic inflammation. Spleen: Normal spleen Adrenals/urinary tract: Adrenal glands and kidneys are normal. The ureters and bladder normal. Stomach/Bowel: Coarse calcifications along the distal esophagus and GE junction unchanged from prior. No nodularity or mass. No gastrohepatic  ligament adenopathy. Stomach, duodenum and small bowel are normal. Appendix cecum normal. The colon and rectosigmoid colon are normal. Vascular/Lymphatic: Abdominal aorta is normal caliber with atherosclerotic calcification. There is no retroperitoneal or periportal lymphadenopathy. No  pelvic lymphadenopathy. Reproductive: Prostate normal Other: No free fluid. Musculoskeletal: Short expansile lesion of the RIGHT tenth rib is unchanged consistent with a healed fracture. IMPRESSION: 1. No evidence esophageal carcinoma recurrence. 2. Coarse calcifications through the GE junction not changed from prior. 3. No evidence of gastrohepatic ligament lymphadenopathy. 4. No evidence of liver metastasis on noncontrast exam. 5. Aortic Atherosclerosis (ICD10-I70.0). Electronically Signed   By: Suzy Bouchard M.D.   On: 04/02/2020 08:40   DG Chest Port 1 View  Result Date: 04/11/2020 CLINICAL DATA:  Weakness and shortness of breath. EXAM: PORTABLE CHEST 1 VIEW COMPARISON:  January 20, 2018 FINDINGS: Multiple sternal wires and vascular clips are seen. Mild, chronic diffusely increased interstitial lung markings are noted. Mild to moderate severity areas of atelectasis and/or infiltrate are seen within the bilateral lung bases. Small bilateral pleural effusions are seen. No pneumothorax is identified. The cardiac silhouette is moderately enlarged. The visualized skeletal structures are unremarkable. IMPRESSION: 1. Mild to moderate severity bibasilar atelectasis and/or infiltrate. 2. Small bilateral pleural effusions. 3. Chronic interstitial lung disease. 4. Moderate cardiomegaly. Electronically Signed   By: Virgina Norfolk M.D.   On: 03/18/2020 21:22

## 2020-04-16 NOTE — Telephone Encounter (Signed)
-----   Message from Wyatt Portela, MD sent at 04/16/2020 10:32 AM EDT ----- Please let them know his scan is cancer free. No need to reschedule till 6 months. Thanks ----- Message ----- From: Kennedy Bucker, LPN Sent: [id number]  10:20 AM EDT To: Wyatt Portela, MD  Patient's wife called stating patient is currently admitted to the hospital with COVID and PNA. Wanted to cancel appointments on Friday. Advised patient's wife that patient could not be seen inside the cancer center for 21 days from positive Covid test. Patient wife states that a MD at the hospital read his scan and said it did not show any cancer. Advised patient's wife that was out of my scope of practice. Wants to know if that is correct that there is no cancer showing on his scan, and if their isn't any cancer showing does his appointment need to be rescheduled. Please advise.   Kim LPN

## 2020-04-16 NOTE — Evaluation (Signed)
Occupational Therapy Evaluation Patient Details Name: Justin Blackburn. MRN: [id number] DOB: 1950-08-28 Today's Date: 04/16/2020    History of Present Illness 69 yo male presenting to ED via EMS with fever and shortness of breath. Tested COVID-19 positive; vaccinated. PMH including A-fib, CAD, diabetes,  ischemic cardiomyopathy, CKD stage lll, DVT on chronic anticoagulation, hyperlipidemia, HTN, esophageal cancer, and anemia of chronic disease.   Clinical Impression   PTA, pt was living with his wife and was independent; used [facility] for mobility. Pt currently requiring Min guard A for LB ADLs and functional mobility. Pt presenting with decreased activity tolerance as seen by fatigue and requiring seated rest breaks. Pt maintaining SpO2 in 90s on 8L HFNC. Pt would benefit from further acute OT to facilitate safe dc. Recommend dc to home with HHOT for further OT to optimize safety, independence with ADLs, and return to PLOF.     Follow Up Recommendations  Home health OT;Supervision/Assistance - 24 hour    Equipment Recommendations  None recommended by OT    Recommendations for Other Services PT consult     Precautions / Restrictions Precautions Precautions: Other (comment) Precaution Comments: monitor sats      Mobility Bed Mobility                    Transfers Overall transfer level: Needs assistance   Transfers: Sit to/from Stand Sit to Stand: Supervision;Min guard         General transfer comment: Supervision-Min guard A for safety    Balance Overall balance assessment: Needs assistance Sitting-balance support: No upper extremity supported;Feet supported Sitting balance-Leahy Scale: Good     Standing balance support: No upper extremity supported;During functional activity Standing balance-Leahy Scale: Fair                             ADL either performed or assessed with clinical judgement   ADL Overall ADL's : Needs  assistance/impaired Eating/Feeding: Set up;Sitting   Grooming: Oral care;Set up;Supervision/safety;Sitting;Brushing hair   Upper Body Bathing: Supervision/ safety;Set up;Sitting   Lower Body Bathing: Min guard;Sit to/from stand   Upper Body Dressing : Supervision/safety;Set up;Sitting   Lower Body Dressing: Min guard;Sit to/from stand   Toilet Transfer: Supervision/safety;Set up;Ambulation (simulated at recliner)           Functional mobility during ADLs: Min guard General ADL Comments: Pt presenting with poor activity tolerance impacting his safe performance of ADLs.      Vision Baseline Vision/History: Wears glasses Patient Visual Report: No change from baseline       Perception     Praxis      Pertinent Vitals/Pain Pain Assessment: Faces Faces Pain Scale: Hurts little more Pain Location: back Pain Descriptors / Indicators: Discomfort;Dull Pain Intervention(s): Monitored during session;Limited activity within patient's tolerance;Repositioned     Hand Dominance Right   Extremity/Trunk Assessment Upper Extremity Assessment Upper Extremity Assessment: Overall WFL for tasks assessed   Lower Extremity Assessment Lower Extremity Assessment: Defer to PT evaluation   Cervical / Trunk Assessment Cervical / Trunk Assessment: Normal   Communication Communication Communication: No difficulties   Cognition Arousal/Alertness: Awake/alert Behavior During Therapy: WFL for tasks assessed/performed Overall Cognitive Status: Within Functional Limits for tasks assessed                                 General Comments: became tearful worrying about his farm  animals (wife away in [place] to work; his main support at farm had his mother-in-law "drop dead" 2 days ago   General Comments  SpO2 90s on 8L, HR 70-80, and RR 20    Exercises Exercises: Other exercises Other Exercises Other Exercises: Educated on proper use (with pt return demonstrating) of IS  and flutter valve.    Shoulder Instructions      Home Living Family/patient expects to be discharged to:: Private residence Living Arrangements: Spouse/significant other Available Help at Discharge: Family;Available PRN/intermittently (wife works as caregiver in [place], gone 3 days in a row) Type of Home: House Home Access: Stairs to enter [employer] of Steps: 2 Entrance Stairs-Rails: Right;Left;Can reach both [place]: One level     Bathroom Shower/Tub: Occupational psychologist: Handicapped height     Home Equipment: Kasandra Knudsen - single point;Shower seat - built in;Walker - standard          Prior Functioning/Environment Level of Independence: Independent with assistive device(s)        Comments: uses straight cane due to back pain and walking on unlevel surfaces on farm; works on his farm (buffalo, camel)        OT Problem List: Decreased strength;Decreased range of motion;Impaired balance (sitting and/or standing);Decreased activity tolerance;Decreased knowledge of use of DME or AE;Decreased knowledge of precautions;Cardiopulmonary status limiting activity      OT Treatment/Interventions: Self-care/ADL training;Therapeutic exercise;Energy conservation;DME and/or AE instruction;Therapeutic activities;Patient/family education    OT Goals(Current goals can be found in the care plan section) Acute Rehab OT Goals Patient Stated Goal: return to taking care of his farm animals OT Goal Formulation: With patient Time For Goal Achievement: 04/30/20 Potential to Achieve Goals: Good  OT Frequency: Min 2X/week   Barriers to D/C:            Co-evaluation              AM-PAC OT "6 Clicks" Daily Activity     Outcome Measure Help from another person eating meals?: A Little Help from another person taking care of personal grooming?: A Little Help from another person toileting, which includes using toliet, bedpan, or urinal?: A Little Help from  another person bathing (including washing, rinsing, drying)?: A Little Help from another person to put on and taking off regular upper body clothing?: A Little Help from another person to put on and taking off regular lower body clothing?: A Little 6 Click Score: 18   End of Session Equipment Utilized During Treatment: Oxygen Nurse Communication: Mobility status  Activity Tolerance: Patient tolerated treatment well Patient left: in chair;with call bell/phone within reach  OT Visit Diagnosis: Unsteadiness on feet (R26.81);Other abnormalities of gait and mobility (R26.89);Muscle weakness (generalized) (M62.81)                Time: 7616-0737 OT Time Calculation (min): 19 min Charges:  OT General Charges $OT Visit: 1 Visit OT Evaluation $OT Eval Moderate Complexity: [place], OTR/L Acute Rehab Pager: [phone] Office: [place] 04/16/2020, 5:26 PM

## 2020-04-16 NOTE — Progress Notes (Addendum)
Physical Therapy Treatment Patient Details Name: Justin Blackburn. MRN: [id number] DOB: 1951-03-16 Today's Date: 04/16/2020    History of Present Illness (P) 69 yo male presenting to ED via EMS with fever and shortness of breath. Tested COVID-19 positive; vaccinated. PMH including A-fib, CAD, diabetes,  ischemic cardiomyopathy, CKD stage lll, DVT on chronic anticoagulation, hyperlipidemia, HTN, esophageal cancer, and anemia of chronic disease.    PT Comments    Pt admitted with above diagnosis. 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 He reports use of cane assists his LBP. Reports his wife is away from home 3 days in a row when working as caregiver and he is alone during that time. He walked up to 22 ft with hand-held assist with sats at lowest 83% on 8L (later RN noted pt's green nasal cannula is basic model and can only deliver up to 6L--likely why he had difficulty recovering on 8 and 10L). Ultimately switched to face mask on 15 L. Patient increased to 90% and maintained during walk back to recliner. Patient remained on NRB at end of session as RN ordering set-up for HFNC.  Pt currently with functional limitations due to the deficits listed below (see PT Problem List). Pt will benefit from skilled PT to increase their independence and safety with mobility to allow discharge to the venue listed below.      Follow Up Recommendations  (P) Home health PT;Supervision/Assistance - 24 hour     Equipment Recommendations  (P) Rolling walker with 5" wheels    Recommendations for Other Services       Precautions / Restrictions Precautions Precautions: (P) Other (comment) Precaution Comments: (P) monitor sats    Mobility  Bed Mobility                  Transfers Overall transfer level: (P) Needs assistance   Transfers: (P) Sit to/from Stand Sit to Stand: (P) Supervision         General transfer comment: (P) for lines,  safety  Ambulation/Gait Ambulation/Gait assistance: (P) Min assist Gait Distance (Feet): (P) 22 Feet (seated rest, 22 ft) Assistive device: (P) 1 person hand held assist (to simulate cane) Gait Pattern/deviations: (P) Step-through pattern;Decreased stride length;Wide base of support Gait velocity: (P) decr   General Gait Details: (P) Rt HHA to simulate cane, however did not provide enough support to control pt's back pain   Stairs             Wheelchair Mobility    Modified Rankin (Stroke Patients Only)       Balance Overall balance assessment: (P) Needs assistance Sitting-balance support: (P) No upper extremity supported;Feet supported Sitting balance-Leahy Scale: (P) Good     Standing balance support: (P) No upper extremity supported Standing balance-Leahy Scale: (P) Fair                              Cognition Arousal/Alertness: (P) Awake/alert Behavior During Therapy: (P) Flat affect Overall Cognitive Status: (P) Within Functional Limits for tasks assessed                                 General Comments: (P) 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     General Comments        Pertinent Vitals/Pain Pain Assessment: (P) Faces Faces Pain Scale: (P) Hurts little more Pain Location: (P) back Pain Descriptors / Indicators: (P) Discomfort;Dull Pain Intervention(s): (P) Limited activity within patient's tolerance;Monitored during session;Repositioned    Home Living Family/patient expects to be discharged to:: (P) Private residence Living Arrangements: (P) Spouse/significant other Available Help at Discharge: (P) Family;Available PRN/intermittently (wife works as caregiver in [place], gone 3  days in a row) Type of Home: (P) [place] Access: (P) Stairs to enter Entrance Stairs-Rails: (P) Right;Left;Can reach both Home Layout: (P) One level Home Equipment: (P) Cane - single point;Shower seat - built in;Walker - standard      Prior Function Level of Independence: (P) Independent with assistive device(s)      Comments: (P) uses straight cane due to back pain and walking on unlevel surfaces on farm; works on his farm (buffalo, Research officer, trade [employer])   PT Goals (current goals can now be found in the care plan section) Acute Rehab PT Goals Patient Stated Goal: (P) return to taking care of his farm animals PT Goal Formulation: (P) With patient Time For Goal Achievement: (P) 04/30/20 Potential to Achieve Goals: (P) Good    Frequency    (P) Min 3X/week      PT Plan      Co-evaluation              AM-PAC PT "6 Clicks" Mobility   Outcome Measure  Help needed turning from your back to your side while in a flat bed without using bedrails?: (P) A Little Help needed moving from lying on your back to sitting on the side of a flat bed without using bedrails?: (P) A Little Help needed moving to and from a bed to a chair (including a wheelchair)?: (P) A Little Help needed standing up from a chair using your arms (e.g., wheelchair or bedside chair)?: (P) A Little Help needed to walk in hospital room?: (P) A Little Help needed climbing 3-5 steps with a railing? : (P) A Lot 6 Click Score: (P) 17    End of Session Equipment Utilized During Treatment: (P) Gait belt;Oxygen Activity Tolerance: (P) Treatment limited secondary to medical complications (Comment) (fatigue, decr sats) Patient left: (P) in chair;with call bell/phone within reach;with chair alarm set Nurse Communication: (P) Mobility status;Other (comment) (decr in sats requiring NRB) PT Visit Diagnosis: (P) Muscle weakness (generalized) (M62.81);Difficulty in walking, not elsewhere classified (R26.2)     Time:  -      Charges:                         Arby Barrette, PT Pager [phone]    Jeanie Cooks Josephina Melcher 04/16/2020, 1:52 PM

## 2020-04-16 NOTE — Telephone Encounter (Signed)
Returned patient's wife's call per Dr Alen Blew. Canceled patient's appointment's for Friday and sent scheduling message to reschedule in six months. Patient's wife verbalized understanding.

## 2020-04-17 ENCOUNTER — Inpatient Hospital Stay (HOSPITAL_COMMUNITY): Payer: Medicare HMO

## 2020-04-17 DIAGNOSIS — E119 Type 2 diabetes mellitus without complications: Secondary | ICD-10-CM | POA: Diagnosis not present

## 2020-04-17 DIAGNOSIS — I480 Paroxysmal atrial fibrillation: Secondary | ICD-10-CM | POA: Diagnosis not present

## 2020-04-17 DIAGNOSIS — U071 COVID-19: Secondary | ICD-10-CM | POA: Diagnosis not present

## 2020-04-17 DIAGNOSIS — I5042 Chronic combined systolic (congestive) and diastolic (congestive) heart failure: Secondary | ICD-10-CM | POA: Diagnosis not present

## 2020-04-17 LAB — CBC WITH DIFFERENTIAL/PLATELET
Abs Immature Granulocytes: 0.11 10*3/uL — ABNORMAL HIGH (ref 0.00–0.07)
Basophils Absolute: 0 10*3/uL (ref 0.0–0.1)
Basophils Relative: 0 %
Eosinophils Absolute: 0 10*3/uL (ref 0.0–0.5)
Eosinophils Relative: 0 %
HCT: 37.5 % — ABNORMAL LOW (ref 39.0–52.0)
Hemoglobin: 12.1 g/dL — ABNORMAL LOW (ref 13.0–17.0)
Immature Granulocytes: 1 %
Lymphocytes Relative: 5 %
Lymphs Abs: 0.6 10*3/uL — ABNORMAL LOW (ref 0.7–4.0)
MCH: 26.5 pg (ref 26.0–34.0)
MCHC: 32.3 g/dL (ref 30.0–36.0)
MCV: 82.1 fL (ref 80.0–100.0)
Monocytes Absolute: 0.5 10*3/uL (ref 0.1–1.0)
Monocytes Relative: 4 %
Neutro Abs: 10.3 10*3/uL — ABNORMAL HIGH (ref 1.7–7.7)
Neutrophils Relative %: 90 %
Platelets: 372 10*3/uL (ref 150–400)
RBC: 4.57 MIL/uL (ref 4.22–5.81)
RDW: 17.3 % — ABNORMAL HIGH (ref 11.5–15.5)
WBC: 11.4 10*3/uL — ABNORMAL HIGH (ref 4.0–10.5)
nRBC: 0 % (ref 0.0–0.2)

## 2020-04-17 LAB — COMPREHENSIVE METABOLIC PANEL
ALT: 15 U/L (ref 0–44)
AST: 26 U/L (ref 15–41)
Albumin: 2.4 g/dL — ABNORMAL LOW (ref 3.5–5.0)
Alkaline Phosphatase: 72 U/L (ref 38–126)
Anion gap: 12 (ref 5–15)
BUN: 64 mg/dL — ABNORMAL HIGH (ref 8–23)
CO2: 21 mmol/L — ABNORMAL LOW (ref 22–32)
Calcium: 8.8 mg/dL — ABNORMAL LOW (ref 8.9–10.3)
Chloride: 104 mmol/L (ref 98–111)
Creatinine, Ser: 2.42 mg/dL — ABNORMAL HIGH (ref 0.61–1.24)
GFR, Estimated: 28 mL/min — ABNORMAL LOW (ref >60)
Glucose, Bld: 181 mg/dL — ABNORMAL HIGH (ref 70–99)
Potassium: 4.3 mmol/L (ref 3.5–5.1)
Sodium: 137 mmol/L (ref 135–145)
Total Bilirubin: 0.5 mg/dL (ref 0.3–1.2)
Total Protein: 5.9 g/dL — ABNORMAL LOW (ref 6.5–8.1)

## 2020-04-17 LAB — MAGNESIUM: Magnesium: 2 mg/dL (ref 1.7–2.4)

## 2020-04-17 LAB — GLUCOSE, CAPILLARY
Glucose-Capillary: 178 mg/dL — ABNORMAL HIGH (ref 70–99)
Glucose-Capillary: 325 mg/dL — ABNORMAL HIGH (ref 70–99)
Glucose-Capillary: 330 mg/dL — ABNORMAL HIGH (ref 70–99)
Glucose-Capillary: 322 mg/dL — ABNORMAL HIGH (ref 70–99)

## 2020-04-17 LAB — D-DIMER, QUANTITATIVE: D-Dimer, Quant: 0.85 ug/mL-FEU — ABNORMAL HIGH (ref 0.00–0.50)

## 2020-04-17 LAB — FERRITIN: Ferritin: 375 ng/mL — ABNORMAL HIGH (ref 24–336)

## 2020-04-17 LAB — C-REACTIVE PROTEIN: CRP: 5.1 mg/dL — ABNORMAL HIGH (ref <1.0)

## 2020-04-17 MED ORDER — INSULIN GLARGINE 100 UNIT/ML ~~LOC~~ SOLN
40.0000 [IU] | Freq: Every day | SUBCUTANEOUS | Status: DC
  Administered 2020-04-18 – 2020-04-19 (×2): 40 [IU] via SUBCUTANEOUS
  Filled 2020-04-17 (×3): qty 0.4

## 2020-04-17 MED ORDER — FUROSEMIDE 80 MG PO TABS
80.0000 mg | ORAL_TABLET | Freq: Once | ORAL | Status: AC
  Administered 2020-04-17: 80 mg via ORAL
  Filled 2020-04-17: qty 1

## 2020-04-17 MED ORDER — INSULIN ASPART 100 UNIT/ML ~~LOC~~ SOLN
8.0000 [IU] | Freq: Three times a day (TID) | SUBCUTANEOUS | Status: DC
  Administered 2020-04-17 – 2020-04-19 (×3): 8 [IU] via SUBCUTANEOUS

## 2020-04-17 NOTE — Progress Notes (Signed)
   04/17/20 0626  Oxygen Therapy  SpO2 97 %  O2 Device HFNC  O2 Flow Rate (L/min) 9 L/min   Pt desatted to 81% without any movement in bed. Pt turned to side with no improvement in O2 saturation. O@ then slowly titrated up to 9L and is now currently satting at 97%. Will titrate down as pt recovers.

## 2020-04-17 NOTE — Progress Notes (Signed)
PROGRESS NOTE                                                                                                                                                                                                             Patient Demographics:    Justin Blackburn, is a 69 y.o. male, DOB - Jan 23, 1951, TFT:[id number]  Outpatient Primary MD for the patient is [name], Hershal Coria   Admit date - 04/02/2020   LOS - 3  Chief Complaint  Patient presents with  . Shortness of Breath       Brief Narrative: Patient is a 69 y.o. male with PMHx of PAF, CKD stage IIIa, HTN, HLD, PAF, chronic combined systolic and diastolic heart failure, DM-2, CKD stage IIIa, VTE, HLD, HTN, history of adenocarcinoma of the GE junction-s/p radiation/chemo (completed therapy on 12/11/2017)-currently on the active surveillance-presenting with fever, shortness of breath, fatigue-found to have acute hypoxic respiratory failure due to COVID-19 pneumonia  COVID-19 vaccinated status: 9/29>> Pfizer x1st dose  10/20>> Pfizer x 2nd dose  Significant Events: 10/31>> Admit to [facility] for hypoxia due to COVID-19 pneumonia  Significant studies: 10/31>>Chest x-ray: Bibasilar atelectasis/infiltrate, chronic interstitial lung disease 10/18>> CT abdomen/pelvis: No evidence of esophageal carcinoma recurrence  COVID-19 medications: Steroids: 10/31>> Remdesivir: 10/31>>  Antibiotics: Rocephin: 10/31 x1 Zithromax: 10/31 x1  Microbiology data: 10/31 >>blood culture: No growth 10/31>> urine culture: < 10,000 colonies/mL insignificant growth  Procedures: None  Consults: None  DVT prophylaxis: apixaban (ELIQUIS) tablet 5 mg     Subjective:   O2 sats at rest around 2 L.  Does get short of breath with ambulation-Per PT note requiring up to 6 L with ambulation.   Assessment  & Plan :   Acute Hypoxic Resp Failure due to Covid 19 Viral pneumonia: Hypoxia seems to have  stabilized-2 L of oxygen at rest-requiring around 6 L with ambulation.  Remains on steroids and Remdesivir-CRP slowly trending down.  No signs of volume overload-but given exertional dyspnea and stability and renal function-we will try to keep him on Lasix to ensure negative balance.  Note-rationale/risk/benefits of Actemra/baricitinib discussed in detail-he has no history of tuberculosis, hepatitis B, diverticulitis.  Understands that these agents are on the EUA by FDA.  If his hypoxia worsens-he has consented to the use of these agents.   Fever: afebrile O2 requirements:  SpO2: 95 %  O2 Flow Rate (L/min): 4 L/min   COVID-19 Labs: Recent Labs    04/15/20 0450 04/16/20 0257 04/17/20 0021  DDIMER 1.03* 0.99* 0.85*  FERRITIN 436* 416* 375*  CRP 14.3* 9.1* 5.1*       Component Value Date/Time   BNP 261.4 (H) 04/06/2020 2100    Recent Labs  Lab 04/15/20 0450  PROCALCITON <0.10    Lab Results  Component Value Date   SARSCOV2NAA POSITIVE (A) 04/13/2020     Prone/Incentive Spirometry: encouraged  incentive spirometry use 3-4/hour.    AKI on CKD stage IIIa: Likely hemodynamically mediated-hold diuretics-volume status stable-no longer on IV fluids.  Continue to avoid nephrotoxic agents.Has significant proteinuria-likely secondary to diabetes/COVID-19 pneumonia-May need to repeat UA once patient is acute infection is over.  Chronic combined systolic/diastolic heart failure: No evidence of decompensation on exam-but has significant exertional dyspnea-we will try some Lasix and attempt to keep him in negative balance and see if that helps his symptoms.  PAF: Appears to be in sinus rhythm-resume amiodarone-remains on Eliquis-Per prior documentation cardiology-not on beta-blocker due to bradycardia.  CAD s/p CABG: No anginal symptoms-continue statin-suspect not on aspirin as patient on Eliquis.  HLD: On statin  History of esophageal cancer: Resume outpatient follow-up with  oncology-completed treatment-on active surveillance.  CT abdomen/pelvis on 10/18 without any evidence of recurrence.  GERD: Continue PPI  DM-2 with uncontrolled hyperglycemia due to steroids: CBGs still on the higher side-increase Lantus to 30 units daily, increase Premeal NovoLog to 8 units-continue SSI-follow and adjust.    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   GI prophylaxis: PPI  ABG:    Component Value Date/Time   PHART 7.498 (H) 01/17/2018 1735   PCO2ART 43.3 01/17/2018 1735   PO2ART 57.5 (L) 01/17/2018 1735   HCO3 24.1 03/29/2020 2135   TCO2 25 03/27/2020 2135   ACIDBASEDEF 2.0 03/26/2020 2135   O2SAT 91.0 03/27/2020 2135    Vent Settings: N/A  Condition -  Guarded  Family Communication  :  Spouse- Mardene Celeste [phone] updated over the phone on 11/3  Code Status :  Full Code  Diet :  Diet Order            Diet heart healthy/carb modified Room service appropriate? Yes; Fluid consistency: Thin  Diet effective now                  Disposition Plan  :   Status is: Inpatient  Remains inpatient appropriate because:Inpatient level of care appropriate due to severity of illness   Dispo: The patient is from: Home              Anticipated d/c is to: Home              Anticipated d/c date is: 3 days              Patient currently is not medically stable to d/c.   Barriers to discharge: Hypoxia requiring O2 supplementation/complete 5 days of IV Remdesivir  Antimicorbials  :    Anti-infectives (From admission, onward)   Start     Dose/Rate Route Frequency Ordered Stop   04/16/20 1000  remdesivir 100 mg in sodium chloride 0.9 % 100 mL IVPB        100 mg 200 mL/hr over 30 Minutes Intravenous Daily 03/25/2020 2332 04/20/20 0959   04/15/20 1000  remdesivir 100 mg in sodium chloride 0.9 %  100 mL IVPB  Status:  Discontinued       "Followed by" Linked Group Details   100  mg 200 mL/hr over 30 Minutes Intravenous Daily 04/12/2020 2333 03/15/2020 2345   03/16/2020 2345  remdesivir 200 mg in sodium chloride 0.9% 250 mL IVPB        200 mg 580 mL/hr over 30 Minutes Intravenous Once 03/27/2020 2331 04/15/20 0834   03/25/2020 2333  remdesivir 200 mg in sodium chloride 0.9% 250 mL IVPB  Status:  Discontinued       "Followed by" Linked Group Details   200 mg 580 mL/hr over 30 Minutes Intravenous Once 03/25/2020 2333 03/18/2020 2345   04/06/2020 2130  cefTRIAXone (ROCEPHIN) 2 g in sodium chloride 0.9 % 100 mL IVPB  Status:  Discontinued        2 g 200 mL/hr over 30 Minutes Intravenous Every 24 hours 03/18/2020 2120 04/15/20 1147   04/02/2020 2130  azithromycin (ZITHROMAX) 500 mg in sodium chloride 0.9 % 250 mL IVPB  Status:  Discontinued        500 mg 250 mL/hr over 60 Minutes Intravenous Every 24 hours 04/07/2020 2120 04/15/20 1147      Inpatient Medications  Scheduled Meds: . amiodarone  200 mg Oral Daily  . apixaban  5 mg Oral BID  . gabapentin  300 mg Oral BID  . insulin aspart  0-15 Units Subcutaneous TID WC  . insulin aspart  0-5 Units Subcutaneous QHS  . insulin aspart  4 Units Subcutaneous TID WC  . insulin glargine  35 Units Subcutaneous Daily  . Ipratropium-Albuterol  1 puff Inhalation BID  . methylPREDNISolone (SOLU-MEDROL) injection  50 mg Intravenous Q12H  . pantoprazole  40 mg Oral Daily  . rosuvastatin  40 mg Oral Daily  . sodium chloride flush  3 mL Intravenous Q12H   Continuous Infusions: . sodium chloride    . remdesivir 100 mg in NS 100 mL 100 mg (04/17/20 1007)   PRN Meds:.sodium chloride, Ipratropium-Albuterol, nitroGLYCERIN, ondansetron **OR** ondansetron (ZOFRAN) IV, sodium chloride flush   Time Spent in minutes  25  See all Orders from today for further details   Oren Binet M.D on 04/17/2020 at 1:35 PM  To page go to [email or web address] - use universal password  [facility] -  Office  [phone]    Objective:   Vitals:   04/17/20  0536 04/17/20 0600 04/17/20 0626 04/17/20 0700  BP: 134/73     Pulse: 63     Resp: (!) 21     Temp: 98.1 F (36.7 C)     TempSrc: Axillary     SpO2: 92% (!) 81% 97% 95%  Weight:      Height:        Wt Readings from Last 3 Encounters:  04/09/2020 94.3 kg  03/14/20 94.6 kg  12/11/19 94.8 kg     Intake/Output Summary (Last 24 hours) at 04/17/2020 1335 Last data filed at 04/17/2020 0900 Gross per 24 hour  Intake 250 ml  Output 1000 ml  Net -750 ml     Physical Exam Gen Exam:Alert awake-not in any distress HEENT:atraumatic, normocephalic Chest: B/L clear to auscultation anteriorly CVS:S1S2 regular Abdomen:soft non tender, non distended Extremities:no edema Neurology: Non focal Skin: no rash   Data Review:    CBC Recent Labs  Lab 03/27/2020 2100 04/08/2020 2135 04/15/20 0450 04/16/20 0257 04/17/20 0021  WBC 7.6  --  7.2 8.0 11.4*  HGB 12.9* 13.3 12.4* 11.7* 12.1*  HCT 41.5 39.0  40.2 36.8* 37.5*  PLT 268  --  255 288 372  MCV 83.5  --  83.6 80.9 82.1  MCH 26.0  --  25.8* 25.7* 26.5  MCHC 31.1  --  30.8 31.8 32.3  RDW 17.6*  --  17.4* 17.3* 17.3*  LYMPHSABS 1.1  --  0.8 0.7 0.6*  MONOABS 0.6  --  0.3 0.4 0.5  EOSABS 0.0  --  0.0 0.0 0.0  BASOSABS 0.0  --  0.0 0.0 0.0    Chemistries  Recent Labs  Lab 03/21/2020 2100 03/29/2020 2135 04/15/20 0450 04/16/20 0257 04/17/20 0021  NA 143 145 143 139 137  K 4.0 4.0 4.3 3.9 4.3  CL 109  --  109 109 104  CO2 21*  --  23 19* 21*  GLUCOSE 160*  --  225* 169* 181*  BUN 47*  --  45* 54* 64*  CREATININE 3.09*  --  2.93* 2.30* 2.42*  CALCIUM 8.7*  --  8.6* 8.5* 8.8*  MG  --   --  2.0 2.0 2.0  AST 33  --  30 25 26   ALT 16  --  15 14 15   ALKPHOS 70  --  68 65 72  BILITOT 1.0  --  0.9 0.6 0.5   ------------------------------------------------------------------------------------------------------------------ No results for input(s): CHOL, HDL, LDLCALC, TRIG, CHOLHDL, LDLDIRECT in the last 72 hours.  Lab Results    Component Value Date   HGBA1C 7.0 (H) 04/15/2020   ------------------------------------------------------------------------------------------------------------------ No results for input(s): TSH, T4TOTAL, T3FREE, THYROIDAB in the last 72 hours.  Invalid input(s): FREET3 ------------------------------------------------------------------------------------------------------------------ Recent Labs    04/16/20 0257 04/17/20 0021  FERRITIN 416* 375*    Coagulation profile Recent Labs  Lab 04/13/2020 2119  INR 1.4*    Recent Labs    04/16/20 0257 04/17/20 0021  DDIMER 0.99* 0.85*    Cardiac Enzymes No results for input(s): CKMB, TROPONINI, MYOGLOBIN in the last 168 hours.  Invalid input(s): CK ------------------------------------------------------------------------------------------------------------------    Component Value Date/Time   BNP 261.4 (H) 03/29/2020 2100    Micro Results Recent Results (from the past 240 hour(s))  Respiratory Panel by RT PCR (Flu A&B, Covid) - Nasopharyngeal Swab     Status: Abnormal   Collection Time: 03/15/2020  9:00 PM   Specimen: Nasopharyngeal Swab  Result Value Ref Range Status   SARS Coronavirus 2 by RT PCR POSITIVE (A) NEGATIVE Final    Comment: RESULT CALLED TO, READ BACK BY AND VERIFIED WITH: RN C BLACK @2258  03/15/2020 BY S GEZAHEGN (NOTE) SARS-CoV-2 target nucleic acids are DETECTED.  SARS-CoV-2 RNA is generally detectable in upper respiratory specimens  during the acute phase of infection. Positive results are indicative of the presence of the identified virus, but do not rule out bacterial infection or co-infection with other pathogens not detected by the test. Clinical correlation with patient history and other diagnostic information is necessary to determine patient infection status. The expected result is Negative.  Fact Sheet for Patients:  PinkCheek.be  Fact Sheet for Healthcare  Providers: GravelBags.it  This test is not yet approved or cleared by the [place] FDA and  has been authorized for detection and/or diagnosis of SARS-CoV-2 by FDA under an Emergency Use Authorization (EUA).  This EUA will remain in effect (meaning this test ca n be used) for the duration of  the COVID-19 declaration under Section 564(b)(1) of the Act, 21 U.S.C. section 360bbb-3(b)(1), unless the authorization is terminated or revoked sooner.      Influenza A  by PCR NEGATIVE NEGATIVE Final   Influenza B by PCR NEGATIVE NEGATIVE Final    Comment: (NOTE) The Xpert Xpress SARS-CoV-2/FLU/RSV assay is intended as an aid in  the diagnosis of influenza from Nasopharyngeal swab specimens and  should not be used as a sole basis for treatment. Nasal washings and  aspirates are unacceptable for Xpert Xpress SARS-CoV-2/FLU/RSV  testing.  Fact Sheet for Patients: PinkCheek.be  Fact Sheet for Healthcare Providers: GravelBags.it  This test is not yet approved or cleared by the [place] FDA and  has been authorized for detection and/or diagnosis of SARS-CoV-2 by  FDA under an Emergency Use Authorization (EUA). This EUA will remain  in effect (meaning this test can be used) for the duration of the  Covid-19 declaration under Section 564(b)(1) of the Act, 21  U.S.C. section 360bbb-3(b)(1), unless the authorization is  terminated or revoked. Performed at [facility], [place] [address], [place], [place] [address]   Blood Culture (routine x 2)     Status: None (Preliminary result)   Collection Time: 03/21/2020  9:20 PM   Specimen: BLOOD  Result Value Ref Range Status   Specimen Description BLOOD LEFT ANTECUBITAL  Final   Special Requests   Final    BOTTLES DRAWN AEROBIC AND ANAEROBIC Blood Culture results may not be optimal due to an inadequate volume of blood received in culture bottles   Culture    Final    NO GROWTH 3 DAYS Performed at [facility], [place] [address] [place], [place] [address]    Report Status PENDING  Incomplete  Blood Culture (routine x 2)     Status: None (Preliminary result)   Collection Time: 04/11/2020  9:31 PM   Specimen: BLOOD RIGHT FOREARM  Result Value Ref Range Status   Specimen Description BLOOD RIGHT FOREARM  Final   Special Requests   Final    BOTTLES DRAWN AEROBIC AND ANAEROBIC Blood Culture results may not be optimal due to an inadequate volume of blood received in culture bottles   Culture   Final    NO GROWTH 3 DAYS Performed at [facility], [place] [address] [place], [place] [address]    Report Status PENDING  Incomplete  Urine culture     Status: Abnormal   Collection Time: 04/09/2020 10:20 PM   Specimen: Urine, Random  Result Value Ref Range Status   Specimen Description URINE, RANDOM  Final   Special Requests NONE  Final   Culture (A)  Final    <10,000 COLONIES/mL INSIGNIFICANT GROWTH Performed at [facility], [place] [address] [place], [place] [address]    Report Status 04/16/2020 FINAL  Final    Radiology Reports CT ABDOMEN PELVIS WO CONTRAST  Result Date: 04/02/2020 CLINICAL DATA:  Esophageal cancer. Gastrointestinal cancer. Chemo radiation completed. EXAM: CT ABDOMEN AND PELVIS WITHOUT CONTRAST TECHNIQUE: Multidetector CT imaging of the abdomen and pelvis was performed following the standard protocol without IV contrast. COMPARISON:  CT 09/26/2019 FINDINGS: Lower chest: Lung bases are clear. Hepatobiliary: No focal hepatic lesion. No biliary duct dilatation. Common bile duct is normal. Pancreas: Pancreas is normal. No ductal dilatation. No pancreatic inflammation. Spleen: Normal spleen Adrenals/urinary tract: Adrenal glands and kidneys are normal. The ureters and bladder normal. Stomach/Bowel: Coarse calcifications along the distal esophagus and GE junction unchanged from prior. No nodularity or mass. No gastrohepatic  ligament adenopathy. Stomach, duodenum and small bowel are normal. Appendix cecum normal. The colon and rectosigmoid colon are normal.  Vascular/Lymphatic: Abdominal aorta is normal caliber with atherosclerotic calcification. There is no retroperitoneal or periportal lymphadenopathy. No pelvic lymphadenopathy. Reproductive: Prostate normal Other: No free fluid. Musculoskeletal: Short expansile lesion of the RIGHT tenth rib is unchanged consistent with a healed fracture. IMPRESSION: 1. No evidence esophageal carcinoma recurrence. 2. Coarse calcifications through the GE junction not changed from prior. 3. No evidence of gastrohepatic ligament lymphadenopathy. 4. No evidence of liver metastasis on noncontrast exam. 5. Aortic Atherosclerosis (ICD10-I70.0). Electronically Signed   By: Suzy Bouchard M.D.   On: 04/02/2020 08:40   DG Chest Port 1 View  Result Date: 04/17/2020 CLINICAL DATA:  Shortness of breath, COVID positive. EXAM: PORTABLE CHEST 1 VIEW COMPARISON:  03/15/2020 and CT chest 09/26/2019. FINDINGS: Trachea is midline. Heart is enlarged, as before. There is mixed interstitial and airspace opacification, bibasilar predominant, similar to 03/26/2020. Difficult to exclude bilateral pleural effusions. IMPRESSION: Mixed interstitial and airspace opacification, mildly basilar predominant. Possible small bilateral pleural effusions. Findings can be seen with congestive heart failure and/or COVID-19 pneumonia. Electronically Signed   By: Lorin Picket M.D.   On: 04/17/2020 08:23   DG Chest Port 1 View  Result Date: 03/19/2020 CLINICAL DATA:  Weakness and shortness of breath. EXAM: PORTABLE CHEST 1 VIEW COMPARISON:  January 20, 2018 FINDINGS: Multiple sternal wires and vascular clips are seen. Mild, chronic diffusely increased interstitial lung markings are noted. Mild to moderate severity areas of atelectasis and/or infiltrate are seen within the bilateral lung bases. Small bilateral pleural effusions are  seen. No pneumothorax is identified. The cardiac silhouette is moderately enlarged. The visualized skeletal structures are unremarkable. IMPRESSION: 1. Mild to moderate severity bibasilar atelectasis and/or infiltrate. 2. Small bilateral pleural effusions. 3. Chronic interstitial lung disease. 4. Moderate cardiomegaly. Electronically Signed   By: Virgina Norfolk M.D.   On: 03/30/2020 21:22

## 2020-04-17 NOTE — Progress Notes (Signed)
Inpatient Diabetes Program Recommendations  AACE/ADA: New Consensus Statement on Inpatient Glycemic Control (2015)  Target Ranges:  Prepandial:   less than 140 mg/dL      Peak postprandial:   less than 180 mg/dL (1-2 hours)      Critically ill patients:  140 - 180 mg/dL   Lab Results  Component Value Date   GLUCAP 325 (H) 04/17/2020   HGBA1C 7.0 (H) 04/15/2020    Review of Glycemic Control Results for JAKIN, PAVAO (MRN [id number]) as of 04/17/2020 13:33  Ref. Range 04/16/2020 11:58 04/16/2020 16:13 04/16/2020 21:00 04/17/2020 07:49 04/17/2020 12:11  Glucose-Capillary Latest Ref Range: 70 - 99 mg/dL 224 (H) 278 (H) 249 (H) 178 (H) 325 (H)   Inpatient Diabetes Program Recommendations:    Consider increase Novolog meal coverage to 8 units tid if eats 50% Secure chat sent to R. Ghimire.  Thank you, Nani Gasser. Tyrice Hewitt, RN, MSN, CDE  Diabetes Coordinator Inpatient Glycemic Control Team Team Pager [phone] (8am-5pm) 04/17/2020 1:34 PM

## 2020-04-17 NOTE — Progress Notes (Signed)
Physical Therapy Treatment Patient Details Name: Justin Blackburn. MRN: [id number] DOB: [birth year] Today's Date: 04/17/2020    History of Present Illness 69 yo male presenting to ED via EMS with fever and shortness of breath. Tested COVID-19 positive; vaccinated. PMH including A-fib, CAD, diabetes,  ischemic cardiomyopathy, CKD stage lll, DVT on chronic anticoagulation, hyperlipidemia, HTN, esophageal cancer, and anemia of chronic disease.    PT Comments    Patient on 2L at rest with sats 93%, however with ambulation x 17 ft on 2L sats decr 82%. Patient with seated rest with sats remaining low 80s and incr to 4L. After 5 minutes of seated rest with cues for pursed lip breathing and less talking, pt only up to 87%. Incr to 6L with slow rise to 92%. Walked 17 ft back to chair on 6L with sats down to 80%; seated rest on 6L with sats up to 88% after 4 minutes. Patient instructed to continue to work on IS and flutter valve and continue seated AROM exercises x 4 extremities.      Follow Up Recommendations  Home health PT;Supervision/Assistance - 24 hour     Equipment Recommendations  Rolling walker with 5" wheels    Recommendations for Other Services       Precautions / Restrictions Precautions Precautions: Other (comment) Precaution Comments: monitor sats    Mobility  Bed Mobility                  Transfers Overall transfer level: Needs assistance   Transfers: Sit to/from Stand Sit to Stand: Min guard         General transfer comment: Min guard A for safety due to slight sway/difficulty standing from armless chair  Ambulation/Gait Ambulation/Gait assistance: Min assist Gait Distance (Feet): 17 Feet (long seated rest; 17) Assistive device: 1 person hand held assist (to simulate cane; refused RW) Gait Pattern/deviations: Step-through pattern;Decreased stride length;Wide base of support Gait velocity: decr   General Gait Details: Rt HHA to simulate cane   Stairs              Wheelchair Mobility    Modified Rankin (Stroke Patients Only)       Balance Overall balance assessment: Needs assistance Sitting-balance support: No upper extremity supported;Feet supported Sitting balance-Leahy Scale: Good     Standing balance support: No upper extremity supported;During functional activity Standing balance-Leahy Scale: Fair                              Cognition Arousal/Alertness: Awake/alert Behavior During Therapy: WFL for tasks assessed/performed Overall Cognitive Status: Within Functional Limits for tasks assessed                                        Exercises Other Exercises Other Exercises: Educated on pursed lip breathing as trying to recover O2 sats. 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 Time For Goal Achievement: 04/30/20 Potential to Achieve Goals: Good Progress towards PT goals: Progressing toward goals    Frequency    Min 3X/week      PT Plan Current plan remains appropriate    Co-evaluation              AM-PAC PT "6 Clicks" Mobility   Outcome Measure  Help needed turning from your back to your side while in a flat bed without using bedrails?: A Little Help needed moving from lying on your back to sitting on the side of a flat bed without using bedrails?: A Little Help needed moving to and from a bed to a chair (including a wheelchair)?: A Little Help needed standing up from a chair using your arms (e.g., wheelchair or bedside chair)?: A Little Help needed to walk in hospital room?: A Little Help needed climbing 3-5 steps with a railing? : A Lot 6 Click Score:  17    End of Session Equipment Utilized During Treatment: Gait belt;Oxygen Activity Tolerance: Treatment limited secondary to medical complications (Comment) (fatigue, decr sats) Patient left: in chair;with call bell/phone within reach Nurse Communication: Mobility status;Other (comment) (decr in sats with walking) PT Visit Diagnosis: Muscle weakness (generalized) (M62.81);Difficulty in walking, not elsewhere classified (R26.2)     Time: 2836-6294 PT Time Calculation (min) (ACUTE ONLY): 32 min  Charges:  $Gait Training: 8-22 mins $Self Care/Home Management: 8-22                      Arby Barrette, PT Pager [phone]    Rexanne Mano 04/17/2020, 12:42 PM

## 2020-04-18 ENCOUNTER — Ambulatory Visit: Payer: Medicare HMO | Admitting: Oncology

## 2020-04-18 ENCOUNTER — Other Ambulatory Visit: Payer: Medicare HMO

## 2020-04-18 ENCOUNTER — Inpatient Hospital Stay (HOSPITAL_COMMUNITY): Payer: Medicare HMO

## 2020-04-18 DIAGNOSIS — U071 COVID-19: Principal | ICD-10-CM

## 2020-04-18 DIAGNOSIS — E119 Type 2 diabetes mellitus without complications: Secondary | ICD-10-CM | POA: Diagnosis not present

## 2020-04-18 DIAGNOSIS — J9600 Acute respiratory failure, unspecified whether with hypoxia or hypercapnia: Secondary | ICD-10-CM

## 2020-04-18 DIAGNOSIS — I5042 Chronic combined systolic (congestive) and diastolic (congestive) heart failure: Secondary | ICD-10-CM | POA: Diagnosis not present

## 2020-04-18 DIAGNOSIS — I480 Paroxysmal atrial fibrillation: Secondary | ICD-10-CM | POA: Diagnosis not present

## 2020-04-18 LAB — COMPREHENSIVE METABOLIC PANEL
ALT: 15 U/L (ref 0–44)
AST: 20 U/L (ref 15–41)
Albumin: 2.5 g/dL — ABNORMAL LOW (ref 3.5–5.0)
Alkaline Phosphatase: 82 U/L (ref 38–126)
Anion gap: 11 (ref 5–15)
BUN: 75 mg/dL — ABNORMAL HIGH (ref 8–23)
CO2: 22 mmol/L (ref 22–32)
Calcium: 8.7 mg/dL — ABNORMAL LOW (ref 8.9–10.3)
Chloride: 105 mmol/L (ref 98–111)
Creatinine, Ser: 2.79 mg/dL — ABNORMAL HIGH (ref 0.61–1.24)
GFR, Estimated: 23 mL/min — ABNORMAL LOW (ref >60)
Glucose, Bld: 168 mg/dL — ABNORMAL HIGH (ref 70–99)
Potassium: 3.8 mmol/L (ref 3.5–5.1)
Sodium: 138 mmol/L (ref 135–145)
Total Bilirubin: 0.5 mg/dL (ref 0.3–1.2)
Total Protein: 5.9 g/dL — ABNORMAL LOW (ref 6.5–8.1)

## 2020-04-18 LAB — CBC WITH DIFFERENTIAL/PLATELET
Abs Immature Granulocytes: 0.13 10*3/uL — ABNORMAL HIGH (ref 0.00–0.07)
Basophils Absolute: 0 10*3/uL (ref 0.0–0.1)
Basophils Relative: 0 %
Eosinophils Absolute: 0 10*3/uL (ref 0.0–0.5)
Eosinophils Relative: 0 %
HCT: 38.3 % — ABNORMAL LOW (ref 39.0–52.0)
Hemoglobin: 12.6 g/dL — ABNORMAL LOW (ref 13.0–17.0)
Immature Granulocytes: 1 %
Lymphocytes Relative: 3 %
Lymphs Abs: 0.4 10*3/uL — ABNORMAL LOW (ref 0.7–4.0)
MCH: 26.5 pg (ref 26.0–34.0)
MCHC: 32.9 g/dL (ref 30.0–36.0)
MCV: 80.5 fL (ref 80.0–100.0)
Monocytes Absolute: 0.7 10*3/uL (ref 0.1–1.0)
Monocytes Relative: 4 %
Neutro Abs: 13.7 10*3/uL — ABNORMAL HIGH (ref 1.7–7.7)
Neutrophils Relative %: 92 %
Platelets: 380 10*3/uL (ref 150–400)
RBC: 4.76 MIL/uL (ref 4.22–5.81)
RDW: 17.2 % — ABNORMAL HIGH (ref 11.5–15.5)
WBC: 14.9 10*3/uL — ABNORMAL HIGH (ref 4.0–10.5)
nRBC: 0 % (ref 0.0–0.2)

## 2020-04-18 LAB — GLUCOSE, CAPILLARY
Glucose-Capillary: 189 mg/dL — ABNORMAL HIGH (ref 70–99)
Glucose-Capillary: 197 mg/dL — ABNORMAL HIGH (ref 70–99)
Glucose-Capillary: 316 mg/dL — ABNORMAL HIGH (ref 70–99)
Glucose-Capillary: 275 mg/dL — ABNORMAL HIGH (ref 70–99)

## 2020-04-18 LAB — MAGNESIUM: Magnesium: 1.9 mg/dL (ref 1.7–2.4)

## 2020-04-18 LAB — D-DIMER, QUANTITATIVE: D-Dimer, Quant: 1.8 ug/mL-FEU — ABNORMAL HIGH (ref 0.00–0.50)

## 2020-04-18 LAB — FERRITIN: Ferritin: 325 ng/mL (ref 24–336)

## 2020-04-18 LAB — C-REACTIVE PROTEIN: CRP: 2.9 mg/dL — ABNORMAL HIGH (ref <1.0)

## 2020-04-18 MED ORDER — TOCILIZUMAB 400 MG/20ML IV SOLN
750.0000 mg | Freq: Once | INTRAVENOUS | Status: AC
  Administered 2020-04-18: 750 mg via INTRAVENOUS
  Filled 2020-04-18: qty 37.5

## 2020-04-18 MED ORDER — ORAL CARE MOUTH RINSE
15.0000 mL | Freq: Two times a day (BID) | OROMUCOSAL | Status: DC
  Administered 2020-04-18 – 2020-04-19 (×4): 15 mL via OROMUCOSAL

## 2020-04-18 NOTE — Progress Notes (Signed)
Report received by this RN. Patient resting comfortably with no needs at this time. °

## 2020-04-18 NOTE — Progress Notes (Addendum)
   04/18/20 0103  Vitals  Pulse Rate 97  Pulse Rate Source Monitor  ECG Heart Rate 97  Resp (!) 22  MEWS COLOR  MEWS Score Color Green  Oxygen Therapy  SpO2 (!) 68 %  O2 Device HFNC;Nasal Cannula  O2 Flow Rate (L/min) 4 L/min  Patient Activity (if Appropriate) Ambulating (From bed to bathroom,back to bed.)  POSS Scale (Pasero Opioid Sedation Scale)  POSS *See Group Information* 1-Acceptable,Awake and alert  MEWS Score  MEWS Temp 0  MEWS Systolic 0  MEWS Pulse 0  MEWS RR 1  MEWS LOC 0  MEWS Score 1   Patient was tolerating 2L O2 while resting in bed at the beginning of the shift. Patient desats to 70s just sitting on EOB. O2 increased to 5L, eventually titrated down to 4L. O2 came off at one time with patient using urinal. SpO2 noted to be in the 60s. O2 re-applied and turned up to 8L, but titrated back down to 4L within a few minutes as patient recovered. SpO2 noted as low as 68% on 4L O2 while patient ambulated, O2 turned up to 5L. It took 32 minutes for SpO2 to increase to 90%. Patient remains of 5L O2 HFNC. SpO2 sustaining 87-89% while resting in bed. Patient denies SOB. Will continue to monitor.

## 2020-04-18 NOTE — Progress Notes (Addendum)
PROGRESS NOTE                                                                                                                                                                                                             Patient Demographics:    Justin Blackburn, is a 69 y.o. male, DOB - 1951/01/09, VZD:[id number]  Outpatient Primary MD for the patient is [name], Hershal Coria   Admit date - 04/01/2020   LOS - 4  Chief Complaint  Patient presents with  . Shortness of Breath       Brief Narrative: Patient is a 69 y.o. male with PMHx of PAF, CKD stage IIIa, HTN, HLD, PAF, chronic combined systolic and diastolic heart failure, DM-2, CKD stage IIIa, VTE, HLD, HTN, history of adenocarcinoma of the GE junction-s/p radiation/chemo (completed therapy on 12/11/2017)-currently on the active surveillance-presenting with fever, shortness of breath, fatigue-found to have acute hypoxic respiratory failure due to COVID-19 pneumonia  COVID-19 vaccinated status: 9/29>> Pfizer x1st dose  10/20>> Pfizer x 2nd dose  Significant Events: 10/31>> Admit to [facility] for hypoxia due to COVID-19 pneumonia 11/4>> worsening hypoxemia overnight-up to 8 L of HFNC (on 2 L on 11/3)-Actemra given.  Significant studies: 10/31>>Chest x-ray: Bibasilar atelectasis/infiltrate, chronic interstitial lung disease 10/18>> CT abdomen/pelvis: No evidence of esophageal carcinoma recurrence 8/10>> Echo: EF 29-51%, grade 1 diastolic dysfunction.  RV systolic function mildly reduced.  COVID-19 medications: Steroids: 10/31>> Remdesivir: 10/31>> Actemra: 11/4 x 1  Antibiotics: Rocephin: 10/31 x1 Zithromax: 10/31 x1  Microbiology data: 10/31 >>blood culture: No growth 10/31>> urine culture: < 10,000 colonies/mL insignificant growth  Procedures: None  Consults: None  DVT prophylaxis: apixaban (ELIQUIS) tablet 5 mg     Subjective:   Appears comfortable but clearly  requiring more HFNC overnight.  Was on 2 L yesterday.  On 8 L this morning.   Assessment  & Plan :   Acute Hypoxic Resp Failure due to Covid 19 Viral pneumonia: Hypoxia had stabilized but unfortunately has worsened-on 8 L of HFNC this morning-continue steroid/Remdesivir-we will go ahead and give one dose of Actemra.  No signs of volume overload.  Note-rationale/risk/benefits of Actemra/baricitinib discussed in detail-he has no history of tuberculosis, hepatitis B, diverticulitis.  Understands that these agents are on the EUA by FDA.  If his hypoxia worsens-he has consented to the use of these agents.  Fever: afebrile O2 requirements:  SpO2: (!) 83 % O2 Flow Rate (L/min): (S) 12 L/min   COVID-19 Labs: Recent Labs    04/16/20 0257 04/17/20 0021 04/18/20 0233  DDIMER 0.99* 0.85* 1.80*  FERRITIN 416* 375* 325  CRP 9.1* 5.1* 2.9*       Component Value Date/Time   BNP 261.4 (H) 03/16/2020 2100    Recent Labs  Lab 04/15/20 0450  PROCALCITON <0.10    Lab Results  Component Value Date   SARSCOV2NAA POSITIVE (A) 03/20/2020     Prone/Incentive Spirometry: encouraged  incentive spirometry use 3-4/hour.    AKI on CKD stage IIIa: Likely hemodynamically mediated-hold diuretics-volume status stable-no longer on IV fluids.  Continue to avoid nephrotoxic agents.Has significant proteinuria-likely secondary to diabetes/COVID-19 pneumonia-May need to repeat UA once patient is acute infection is over.  Chronic combined systolic/diastolic heart failure: No evidence of decompensation on exam-given one dose of Lasix yesterday-slight bump in creatinine-hold Lasix and follow.[name]  PAF: Appears to be in sinus rhythm-resume amiodarone-remains on Eliquis-Per prior documentation cardiology-not on beta-blocker due to bradycardia.  CAD s/p CABG: No anginal symptoms-continue statin-suspect not on aspirin as patient on Eliquis.  HLD: On statin  History of esophageal cancer: Resume outpatient  follow-up with oncology-completed treatment-on active surveillance.  CT abdomen/pelvis on 10/18 without any evidence of recurrence.  GERD: Continue PPI  DM-2 with uncontrolled hyperglycemia due to steroids: CBGs better-continue Lantus 30 units daily, 8 units of premeal NovoLog-and SSI.  Follow and adjust.    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   GI prophylaxis: PPI  ABG:    Component Value Date/Time   PHART 7.498 (H) 01/17/2018 1735   PCO2ART 43.3 01/17/2018 1735   PO2ART 57.5 (L) 01/17/2018 1735   HCO3 24.1 03/28/2020 2135   TCO2 25 04/11/2020 2135   ACIDBASEDEF 2.0 03/30/2020 2135   O2SAT 91.0 04/12/2020 2135    Vent Settings: N/A  Condition -  Guarded  Family Communication  :  Spouse- Mardene Celeste Spicher-[phone] updated over the phone on 11/4  Code Status :  Full Code  Diet :  Diet Order            Diet heart healthy/carb modified Room service appropriate? Yes; Fluid consistency: Thin  Diet effective now                  Disposition Plan  :   Status is: Inpatient  Remains inpatient appropriate because:Inpatient level of care appropriate due to severity of illness   Dispo: The patient is from: Home              Anticipated d/c is to: Home              Anticipated d/c date is: 3 days              Patient currently is not medically stable to d/c.   Barriers to discharge: Hypoxia requiring O2 supplementation/complete 5 days of IV Remdesivir  Antimicorbials  :    Anti-infectives (From admission, onward)   Start     Dose/Rate Route Frequency Ordered Stop   04/16/20 1000  remdesivir 100 mg in sodium chloride 0.9 % 100 mL IVPB        100 mg 200 mL/hr over 30 Minutes Intravenous Daily 04/03/2020 2332 04/20/20 0959   04/15/20 1000  remdesivir 100 mg in sodium chloride 0.9 % 100 mL IVPB  Status:  Discontinued       "Followed by" Linked Group Details   100 mg 200 mL/hr over  30 Minutes Intravenous Daily 04/11/2020 2333 03/22/2020 2345   04/13/2020 2345  remdesivir 200 mg in sodium chloride 0.9% 250 mL IVPB        200 mg 580 mL/hr over 30 Minutes Intravenous Once 03/18/2020 2331 04/15/20 0834   03/20/2020 2333  remdesivir 200 mg in sodium chloride 0.9% 250 mL IVPB  Status:  Discontinued       "Followed by" Linked Group Details   200 mg 580 mL/hr over 30 Minutes Intravenous Once 03/24/2020 2333 03/17/2020 2345   03/15/2020 2130  cefTRIAXone (ROCEPHIN) 2 g in sodium chloride 0.9 % 100 mL IVPB  Status:  Discontinued        2 g 200 mL/hr over 30 Minutes Intravenous Every 24 hours 04/09/2020 2120 04/15/20 1147   03/23/2020 2130  azithromycin (ZITHROMAX) 500 mg in sodium chloride 0.9 % 250 mL IVPB  Status:  Discontinued        500 mg 250 mL/hr over 60 Minutes Intravenous Every 24 hours 04/04/2020 2120 04/15/20 1147      Inpatient Medications  Scheduled Meds: . amiodarone  200 mg Oral Daily  . apixaban  5 mg Oral BID  . gabapentin  300 mg Oral BID  . insulin aspart  0-15 Units Subcutaneous TID WC  . insulin aspart  0-5 Units Subcutaneous QHS  . insulin aspart  8 Units Subcutaneous TID WC  . insulin glargine  40 Units Subcutaneous Daily  . Ipratropium-Albuterol  1 puff Inhalation BID  . mouth rinse  15 mL Mouth Rinse BID  . methylPREDNISolone (SOLU-MEDROL) injection  50 mg Intravenous Q12H  . pantoprazole  40 mg Oral Daily  . rosuvastatin  40 mg Oral Daily  . sodium chloride flush  3 mL Intravenous Q12H   Continuous Infusions: . sodium chloride    . remdesivir 100 mg in NS 100 mL 100 mg (04/18/20 0916)  . tocilizumab (ACTEMRA) - non-COVID treatment 750 mg (04/18/20 1144)   PRN Meds:.sodium chloride, Ipratropium-Albuterol, nitroGLYCERIN, ondansetron **OR** ondansetron (ZOFRAN) IV, sodium chloride flush   Time Spent in minutes  25  See all Orders from today for further details   Oren Binet M.D on 04/18/2020 at 11:58 AM  To page go to [email or web address] - use universal  password  [facility] -  Office  [phone]    Objective:   Vitals:   04/18/20 0127 04/18/20 0221 04/18/20 0514 04/18/20 0835  BP:   (!) 146/63   Pulse: 67 66 68   Resp: (!) 25 (!) 25 20   Temp:   98.3 F (36.8 C)   TempSrc:   Axillary   SpO2: 90% 92% 92% (!) 83%  Weight:      Height:        Wt Readings from Last 3 Encounters:  04/13/2020 94.3 kg  03/14/20 94.6 kg  12/11/19 94.8 kg     Intake/Output Summary (Last 24 hours) at 04/18/2020 1158 Last data filed at 04/18/2020 1114 Gross per 24 hour  Intake 366 ml  Output 750 ml  Net -384 ml     Physical Exam Gen Exam:Alert awake-not in any distress HEENT:atraumatic, normocephalic Chest: B/L clear to auscultation anteriorly CVS:S1S2 regular Abdomen:soft non tender, non distended Extremities:no edema Neurology: Non focal Skin: no rash   Data Review:    CBC Recent Labs  Lab 03/15/2020 2100 04/12/2020 2100 04/13/2020 2135 04/15/20 0450 04/16/20 0257 04/17/20 0021 04/18/20  0233  WBC 7.6  --   --  7.2 8.0 11.4* 14.9*  HGB 12.9*   < > 13.3 12.4* 11.7* 12.1* 12.6*  HCT 41.5   < > 39.0 40.2 36.8* 37.5* 38.3*  PLT 268  --   --  255 288 372 380  MCV 83.5  --   --  83.6 80.9 82.1 80.5  MCH 26.0  --   --  25.8* 25.7* 26.5 26.5  MCHC 31.1  --   --  30.8 31.8 32.3 32.9  RDW 17.6*  --   --  17.4* 17.3* 17.3* 17.2*  LYMPHSABS 1.1  --   --  0.8 0.7 0.6* 0.4*  MONOABS 0.6  --   --  0.3 0.4 0.5 0.7  EOSABS 0.0  --   --  0.0 0.0 0.0 0.0  BASOSABS 0.0  --   --  0.0 0.0 0.0 0.0   < > = values in this interval not displayed.    Chemistries  Recent Labs  Lab 04/10/2020 2100 03/25/2020 2100 04/02/2020 2135 04/15/20 0450 04/16/20 0257 04/17/20 0021 04/18/20 0233  NA 143   < > 145 143 139 137 138  K 4.0   < > 4.0 4.3 3.9 4.3 3.8  CL 109  --   --  109 109 104 105  CO2 21*  --   --  23 19* 21* 22  GLUCOSE 160*  --   --  225* 169* 181* 168*  BUN 47*  --   --  45* 54* 64* 75*  CREATININE 3.09*  --   --  2.93* 2.30* 2.42*  2.79*  CALCIUM 8.7*  --   --  8.6* 8.5* 8.8* 8.7*  MG  --   --   --  2.0 2.0 2.0 1.9  AST 33  --   --  30 25 26 20   ALT 16  --   --  15 14 15 15   ALKPHOS 70  --   --  68 65 72 82  BILITOT 1.0  --   --  0.9 0.6 0.5 0.5   < > = values in this interval not displayed.   ------------------------------------------------------------------------------------------------------------------ No results for input(s): CHOL, HDL, LDLCALC, TRIG, CHOLHDL, LDLDIRECT in the last 72 hours.  Lab Results  Component Value Date   HGBA1C 7.0 (H) 04/15/2020   ------------------------------------------------------------------------------------------------------------------ No results for input(s): TSH, T4TOTAL, T3FREE, THYROIDAB in the last 72 hours.  Invalid input(s): FREET3 ------------------------------------------------------------------------------------------------------------------ Recent Labs    04/17/20 0021 04/18/20 0233  FERRITIN 375* 325    Coagulation profile Recent Labs  Lab 04/13/2020 2119  INR 1.4*    Recent Labs    04/17/20 0021 04/18/20 0233  DDIMER 0.85* 1.80*    Cardiac Enzymes No results for input(s): CKMB, TROPONINI, MYOGLOBIN in the last 168 hours.  Invalid input(s): CK ------------------------------------------------------------------------------------------------------------------    Component Value Date/Time   BNP 261.4 (H) 03/25/2020 2100    Micro Results Recent Results (from the past 240 hour(s))  Respiratory Panel by RT PCR (Flu A&B, Covid) - Nasopharyngeal Swab     Status: Abnormal   Collection Time: 03/24/2020  9:00 PM   Specimen: Nasopharyngeal Swab  Result Value Ref Range Status   SARS Coronavirus 2 by RT PCR POSITIVE (A) NEGATIVE Final    Comment: RESULT CALLED TO, READ BACK BY AND VERIFIED WITH: RN C BLACK @2258  04/13/2020 BY S GEZAHEGN (NOTE) SARS-CoV-2 target nucleic acids are DETECTED.  SARS-CoV-2 RNA is generally detectable in upper respiratory  specimens  during the acute phase of infection. Positive results are indicative of the presence of the identified virus, but do not rule out bacterial infection or co-infection with other pathogens not detected by the test. Clinical correlation with patient history and other diagnostic information is necessary to determine patient infection status. The expected result is Negative.  Fact Sheet for Patients:  PinkCheek.be  Fact Sheet for Healthcare Providers: GravelBags.it  This test is not yet approved or cleared by the [place] FDA and  has been authorized for detection and/or diagnosis of SARS-CoV-2 by FDA under an Emergency Use Authorization (EUA).  This EUA will remain in effect (meaning this test ca n be used) for the duration of  the COVID-19 declaration under Section 564(b)(1) of the Act, 21 U.S.C. section 360bbb-3(b)(1), unless the authorization is terminated or revoked sooner.      Influenza A by PCR NEGATIVE NEGATIVE Final   Influenza B by PCR NEGATIVE NEGATIVE Final    Comment: (NOTE) The Xpert Xpress SARS-CoV-2/FLU/RSV assay is intended as an aid in  the diagnosis of influenza from Nasopharyngeal swab specimens and  should not be used as a sole basis for treatment. Nasal washings and  aspirates are unacceptable for Xpert Xpress SARS-CoV-2/FLU/RSV  testing.  Fact Sheet for Patients: PinkCheek.be  Fact Sheet for Healthcare Providers: GravelBags.it  This test is not yet approved or cleared by the [place] FDA and  has been authorized for detection and/or diagnosis of SARS-CoV-2 by  FDA under an Emergency Use Authorization (EUA). This EUA will remain  in effect (meaning this test can be used) for the duration of the  Covid-19 declaration under Section 564(b)(1) of the Act, 21  U.S.C. section 360bbb-3(b)(1), unless the authorization is    terminated or revoked. Performed at [facility], [place] [address] [place], [place] [address]   Blood Culture (routine x 2)     Status: None (Preliminary result)   Collection Time: 04/04/2020  9:20 PM   Specimen: BLOOD  Result Value Ref Range Status   Specimen Description BLOOD LEFT ANTECUBITAL  Final   Special Requests   Final    BOTTLES DRAWN AEROBIC AND ANAEROBIC Blood Culture results may not be optimal due to an inadequate volume of blood received in culture bottles   Culture   Final    NO GROWTH 4 DAYS Performed at [facility], [place] [address], [place], [place] [address]    Report Status PENDING  Incomplete  Blood Culture (routine x 2)     Status: None (Preliminary result)   Collection Time: 03/30/2020  9:31 PM   Specimen: BLOOD RIGHT FOREARM  Result Value Ref Range Status   Specimen Description BLOOD RIGHT FOREARM  Final   Special Requests   Final    BOTTLES DRAWN AEROBIC AND ANAEROBIC Blood Culture results may not be optimal due to an inadequate volume of blood received in culture bottles   Culture   Final    NO GROWTH 4 DAYS Performed at [facility], [place] [address], [place], [place] [address]    Report Status PENDING  Incomplete  Urine culture     Status: Abnormal   Collection Time: 03/29/2020 10:20 PM   Specimen: Urine, Random  Result Value Ref Range Status   Specimen Description URINE, RANDOM  Final   Special Requests NONE  Final   Culture (A)  Final    <10,000 COLONIES/mL INSIGNIFICANT GROWTH Performed at [facility], [place],  [place] [address]    Report Status 04/16/2020 FINAL  Final    Radiology Reports CT ABDOMEN PELVIS WO CONTRAST  Result Date: 04/02/2020 CLINICAL DATA:  Esophageal cancer. Gastrointestinal cancer. Chemo radiation completed. EXAM: CT ABDOMEN AND PELVIS WITHOUT CONTRAST TECHNIQUE: Multidetector CT imaging of the abdomen and pelvis was performed following the standard protocol without IV contrast.  COMPARISON:  CT 09/26/2019 FINDINGS: Lower chest: Lung bases are clear. Hepatobiliary: No focal hepatic lesion. No biliary duct dilatation. Common bile duct is normal. Pancreas: Pancreas is normal. No ductal dilatation. No pancreatic inflammation. Spleen: Normal spleen Adrenals/urinary tract: Adrenal glands and kidneys are normal. The ureters and bladder normal. Stomach/Bowel: Coarse calcifications along the distal esophagus and GE junction unchanged from prior. No nodularity or mass. No gastrohepatic ligament adenopathy. Stomach, duodenum and small bowel are normal. Appendix cecum normal. The colon and rectosigmoid colon are normal. Vascular/Lymphatic: Abdominal aorta is normal caliber with atherosclerotic calcification. There is no retroperitoneal or periportal lymphadenopathy. No pelvic lymphadenopathy. Reproductive: Prostate normal Other: No free fluid. Musculoskeletal: Short expansile lesion of the RIGHT tenth rib is unchanged consistent with a healed fracture. IMPRESSION: 1. No evidence esophageal carcinoma recurrence. 2. Coarse calcifications through the GE junction not changed from prior. 3. No evidence of gastrohepatic ligament lymphadenopathy. 4. No evidence of liver metastasis on noncontrast exam. 5. Aortic Atherosclerosis (ICD10-I70.0). Electronically Signed   By: Suzy Bouchard M.D.   On: 04/02/2020 08:40   DG Chest Port 1 View  Result Date: 04/17/2020 CLINICAL DATA:  Shortness of breath, COVID positive. EXAM: PORTABLE CHEST 1 VIEW COMPARISON:  03/27/2020 and CT chest 09/26/2019. FINDINGS: Trachea is midline. Heart is enlarged, as before. There is mixed interstitial and airspace opacification, bibasilar predominant, similar to 04/12/2020. Difficult to exclude bilateral pleural effusions. IMPRESSION: Mixed interstitial and airspace opacification, mildly basilar predominant. Possible small bilateral pleural effusions. Findings can be seen with congestive heart failure and/or COVID-19 pneumonia.  Electronically Signed   By: Lorin Picket M.D.   On: 04/17/2020 08:23   DG Chest Port 1 View  Result Date: 04/13/2020 CLINICAL DATA:  Weakness and shortness of breath. EXAM: PORTABLE CHEST 1 VIEW COMPARISON:  January 20, 2018 FINDINGS: Multiple sternal wires and vascular clips are seen. Mild, chronic diffusely increased interstitial lung markings are noted. Mild to moderate severity areas of atelectasis and/or infiltrate are seen within the bilateral lung bases. Small bilateral pleural effusions are seen. No pneumothorax is identified. The cardiac silhouette is moderately enlarged. The visualized skeletal structures are unremarkable. IMPRESSION: 1. Mild to moderate severity bibasilar atelectasis and/or infiltrate. 2. Small bilateral pleural effusions. 3. Chronic interstitial lung disease. 4. Moderate cardiomegaly. Electronically Signed   By: Virgina Norfolk M.D.   On: 04/06/2020 21:22   DG Chest Port 1V same Day  Result Date: 04/18/2020 CLINICAL DATA:  Shortness of breath, COVID EXAM: PORTABLE CHEST 1 VIEW COMPARISON:  04/17/2020 FINDINGS: Low lung volumes. Persistent diffuse bilateral opacities. Unchanged possible small bilateral pleural effusions. No pneumothorax. Cardiomegaly. IMPRESSION: No substantial change in bilateral pulmonary opacities and possible small bilateral pleural effusions with cardiomegaly. May reflect COVID-19 pneumonia and/or edema. Electronically Signed   By: Macy Mis M.D.   On: 04/18/2020 09:32

## 2020-04-18 NOTE — Progress Notes (Signed)
Physical Therapy Treatment Patient Details Name: Justin Blackburn. MRN: [id number] DOB: [birth year] Today's Date: 04/18/2020    History of Present Illness 69 yo male presenting to ED via EMS with fever and shortness of breath. Tested COVID-19 positive; vaccinated. PMH including A-fib, CAD, diabetes,  ischemic cardiomyopathy, CKD stage lll, DVT on chronic anticoagulation, hyperlipidemia, HTN, esophageal cancer, and anemia of chronic disease.    PT Comments    Patient has required increase to HFNC 11L O2 since 11/3 when he was on 2L at rest. Patient saturating 90% with RR 22. Patient noted to be anxious re: attempting mobility with his RR increasing to 30s and sats down to 88% with brief conversation. Educated again on pursed lip breathing and not speaking while trying to recover. Patient had slid down in the bed and agreed to allow 2 person assist to scoot him up and raise HOB to improve lung function. After repositioned into semi-chair position, sats down to 78% and slowly (~10 minutes) recovered to 88% on HFNC 11L. Further activity deferred.    Follow Up Recommendations  Home health PT;Supervision/Assistance - 24 hour (when medically improved)     Equipment Recommendations  Rolling walker with 5" wheels    Recommendations for Other Services       Precautions / Restrictions Precautions Precautions: Other (comment) Precaution Comments: monitor sats    Mobility  Bed Mobility                  Transfers                    Ambulation/Gait                 Stairs             Wheelchair Mobility    Modified Rankin (Stroke Patients Only)       Balance                                            Cognition Arousal/Alertness: Awake/alert Behavior During Therapy: Anxious Overall Cognitive Status: Within Functional Limits for tasks assessed                                 General Comments: feeling short of breath       Exercises Other Exercises Other Exercises: Educated on pursed lip breathing as trying to recover O2 sats. 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 Time For Goal Achievement: 04/30/20 Potential to Achieve Goals: Good Progress towards PT goals: Not progressing toward goals - comment (worsening hypoxemia)    Frequency    Min 3X/week      PT Plan Current plan remains appropriate (with medical improvement)    Co-evaluation              AM-PAC PT "6 Clicks" Mobility  Outcome Measure  Help needed turning from your back to your side while in a flat bed without using bedrails?: A Little Help needed moving from lying on your back to sitting on the side of a flat bed without using bedrails?: A Little Help needed moving to and from a bed to a chair (including a wheelchair)?: A Lot Help needed standing up from a chair using your arms (e.g., wheelchair or bedside chair)?: A Lot Help needed to walk in hospital room?: Total Help needed climbing 3-5 steps with a railing? : Total 6 Click Score: 12    End of Session Equipment Utilized During Treatment: Oxygen Activity Tolerance: Treatment limited secondary to medical complications (Comment) (fatigue, decr sats) Patient left: with call bell/phone within reach;in bed;with bed alarm set (chair position) Nurse Communication: Other (comment) (decr in sats with repositioning) PT Visit Diagnosis: Muscle weakness (generalized) (M62.81);Difficulty in walking, not elsewhere classified (R26.2)     Time: 1111-1130 PT Time Calculation (min) (ACUTE ONLY): 19 min  Charges:  $Self Care/Home Management: 8-22                       Arby Barrette, PT Pager [phone]    Rexanne Mano 04/18/2020, 2:02 PM

## 2020-04-19 ENCOUNTER — Inpatient Hospital Stay (HOSPITAL_COMMUNITY): Payer: Medicare HMO

## 2020-04-19 ENCOUNTER — Inpatient Hospital Stay: Payer: Medicare HMO | Admitting: Oncology

## 2020-04-19 ENCOUNTER — Encounter (HOSPITAL_COMMUNITY): Admission: EM | Disposition: E | Payer: Self-pay | Source: Home / Self Care | Attending: Internal Medicine

## 2020-04-19 ENCOUNTER — Inpatient Hospital Stay: Payer: Medicare HMO

## 2020-04-19 ENCOUNTER — Other Ambulatory Visit: Payer: Self-pay | Admitting: Interventional Cardiology

## 2020-04-19 ENCOUNTER — Inpatient Hospital Stay (HOSPITAL_COMMUNITY): Payer: Medicare HMO | Admitting: Certified Registered"

## 2020-04-19 DIAGNOSIS — E119 Type 2 diabetes mellitus without complications: Secondary | ICD-10-CM | POA: Diagnosis not present

## 2020-04-19 DIAGNOSIS — I6621 Occlusion and stenosis of right posterior cerebral artery: Secondary | ICD-10-CM | POA: Diagnosis not present

## 2020-04-19 DIAGNOSIS — J9621 Acute and chronic respiratory failure with hypoxia: Secondary | ICD-10-CM | POA: Diagnosis not present

## 2020-04-19 DIAGNOSIS — I639 Cerebral infarction, unspecified: Secondary | ICD-10-CM | POA: Diagnosis not present

## 2020-04-19 DIAGNOSIS — I63431 Cerebral infarction due to embolism of right posterior cerebral artery: Secondary | ICD-10-CM

## 2020-04-19 DIAGNOSIS — Z66 Do not resuscitate: Secondary | ICD-10-CM | POA: Diagnosis not present

## 2020-04-19 DIAGNOSIS — J1282 Pneumonia due to coronavirus disease 2019: Secondary | ICD-10-CM | POA: Diagnosis not present

## 2020-04-19 DIAGNOSIS — I5042 Chronic combined systolic (congestive) and diastolic (congestive) heart failure: Secondary | ICD-10-CM | POA: Diagnosis not present

## 2020-04-19 DIAGNOSIS — U071 COVID-19: Secondary | ICD-10-CM | POA: Diagnosis not present

## 2020-04-19 DIAGNOSIS — N184 Chronic kidney disease, stage 4 (severe): Secondary | ICD-10-CM | POA: Diagnosis not present

## 2020-04-19 DIAGNOSIS — J690 Pneumonitis due to inhalation of food and vomit: Secondary | ICD-10-CM | POA: Diagnosis not present

## 2020-04-19 DIAGNOSIS — R6521 Severe sepsis with septic shock: Secondary | ICD-10-CM | POA: Diagnosis not present

## 2020-04-19 DIAGNOSIS — I63421 Cerebral infarction due to embolism of right anterior cerebral artery: Secondary | ICD-10-CM | POA: Diagnosis not present

## 2020-04-19 DIAGNOSIS — I130 Hypertensive heart and chronic kidney disease with heart failure and stage 1 through stage 4 chronic kidney disease, or unspecified chronic kidney disease: Secondary | ICD-10-CM | POA: Diagnosis not present

## 2020-04-19 DIAGNOSIS — I480 Paroxysmal atrial fibrillation: Secondary | ICD-10-CM | POA: Diagnosis not present

## 2020-04-19 DIAGNOSIS — N170 Acute kidney failure with tubular necrosis: Secondary | ICD-10-CM | POA: Diagnosis not present

## 2020-04-19 DIAGNOSIS — J9622 Acute and chronic respiratory failure with hypercapnia: Secondary | ICD-10-CM | POA: Diagnosis not present

## 2020-04-19 HISTORY — PX: IR PTA INTRACRANIAL: IMG2344

## 2020-04-19 HISTORY — PX: IR ANGIO INTRA EXTRACRAN SEL COM CAROTID INNOMINATE BILAT MOD SED: IMG5360

## 2020-04-19 HISTORY — PX: RADIOLOGY WITH ANESTHESIA: SHX6223

## 2020-04-19 HISTORY — PX: IR CT HEAD LTD: IMG2386

## 2020-04-19 HISTORY — PX: IR PERCUTANEOUS ART THROMBECTOMY/INFUSION INTRACRANIAL INC DIAG ANGIO: IMG6087

## 2020-04-19 LAB — CBC WITH DIFFERENTIAL/PLATELET
Abs Immature Granulocytes: 0.23 10*3/uL — ABNORMAL HIGH (ref 0.00–0.07)
Basophils Absolute: 0 10*3/uL (ref 0.0–0.1)
Basophils Relative: 0 %
Eosinophils Absolute: 0 10*3/uL (ref 0.0–0.5)
Eosinophils Relative: 0 %
HCT: 42.8 % (ref 39.0–52.0)
Hemoglobin: 13.7 g/dL (ref 13.0–17.0)
Immature Granulocytes: 2 %
Lymphocytes Relative: 3 %
Lymphs Abs: 0.4 10*3/uL — ABNORMAL LOW (ref 0.7–4.0)
MCH: 25.8 pg — ABNORMAL LOW (ref 26.0–34.0)
MCHC: 32 g/dL (ref 30.0–36.0)
MCV: 80.8 fL (ref 80.0–100.0)
Monocytes Absolute: 0.7 10*3/uL (ref 0.1–1.0)
Monocytes Relative: 5 %
Neutro Abs: 12.9 10*3/uL — ABNORMAL HIGH (ref 1.7–7.7)
Neutrophils Relative %: 90 %
Platelets: 397 10*3/uL (ref 150–400)
RBC: 5.3 MIL/uL (ref 4.22–5.81)
RDW: 17.5 % — ABNORMAL HIGH (ref 11.5–15.5)
WBC: 14.2 10*3/uL — ABNORMAL HIGH (ref 4.0–10.5)
nRBC: 0 % (ref 0.0–0.2)

## 2020-04-19 LAB — COMPREHENSIVE METABOLIC PANEL
ALT: 17 U/L (ref 0–44)
AST: 20 U/L (ref 15–41)
Albumin: 2.7 g/dL — ABNORMAL LOW (ref 3.5–5.0)
Alkaline Phosphatase: 93 U/L (ref 38–126)
Anion gap: 13 (ref 5–15)
BUN: 78 mg/dL — ABNORMAL HIGH (ref 8–23)
CO2: 23 mmol/L (ref 22–32)
Calcium: 8.9 mg/dL (ref 8.9–10.3)
Chloride: 105 mmol/L (ref 98–111)
Creatinine, Ser: 2.61 mg/dL — ABNORMAL HIGH (ref 0.61–1.24)
GFR, Estimated: 25 mL/min — ABNORMAL LOW (ref >60)
Glucose, Bld: 149 mg/dL — ABNORMAL HIGH (ref 70–99)
Potassium: 3.8 mmol/L (ref 3.5–5.1)
Sodium: 141 mmol/L (ref 135–145)
Total Bilirubin: 0.7 mg/dL (ref 0.3–1.2)
Total Protein: 6.4 g/dL — ABNORMAL LOW (ref 6.5–8.1)

## 2020-04-19 LAB — POCT I-STAT 7, (LYTES, BLD GAS, ICA,H+H)
Acid-base deficit: 3 mmol/L — ABNORMAL HIGH (ref 0.0–2.0)
Bicarbonate: 23.1 mmol/L (ref 20.0–28.0)
Calcium, Ion: 1.14 mmol/L — ABNORMAL LOW (ref 1.15–1.40)
HCT: 32 % — ABNORMAL LOW (ref 39.0–52.0)
Hemoglobin: 10.9 g/dL — ABNORMAL LOW (ref 13.0–17.0)
O2 Saturation: 99 %
Patient temperature: 98.6
Potassium: 3.8 mmol/L (ref 3.5–5.1)
Sodium: 142 mmol/L (ref 135–145)
TCO2: 24 mmol/L (ref 22–32)
pCO2 arterial: 47 mmHg (ref 32.0–48.0)
pH, Arterial: 7.299 — ABNORMAL LOW (ref 7.350–7.450)
pO2, Arterial: 157 mmHg — ABNORMAL HIGH (ref 83.0–108.0)

## 2020-04-19 LAB — CULTURE, BLOOD (ROUTINE X 2)
Culture: NO GROWTH
Culture: NO GROWTH

## 2020-04-19 LAB — GLUCOSE, CAPILLARY
Glucose-Capillary: 148 mg/dL — ABNORMAL HIGH (ref 70–99)
Glucose-Capillary: 129 mg/dL — ABNORMAL HIGH (ref 70–99)
Glucose-Capillary: 111 mg/dL — ABNORMAL HIGH (ref 70–99)

## 2020-04-19 LAB — D-DIMER, QUANTITATIVE: D-Dimer, Quant: 4.94 ug/mL-FEU — ABNORMAL HIGH (ref 0.00–0.50)

## 2020-04-19 LAB — FERRITIN: Ferritin: 314 ng/mL (ref 24–336)

## 2020-04-19 LAB — C-REACTIVE PROTEIN: CRP: 2.4 mg/dL — ABNORMAL HIGH (ref <1.0)

## 2020-04-19 LAB — MAGNESIUM: Magnesium: 2 mg/dL (ref 1.7–2.4)

## 2020-04-19 SURGERY — IR WITH ANESTHESIA
Anesthesia: General

## 2020-04-19 MED ORDER — IOHEXOL 240 MG/ML SOLN
INTRAMUSCULAR | Status: AC
  Filled 2020-04-19: qty 200

## 2020-04-19 MED ORDER — ASPIRIN 81 MG PO CHEW
CHEWABLE_TABLET | ORAL | Status: AC
  Filled 2020-04-19: qty 1

## 2020-04-19 MED ORDER — SUCCINYLCHOLINE CHLORIDE 200 MG/10ML IV SOSY
PREFILLED_SYRINGE | INTRAVENOUS | Status: DC | PRN
  Administered 2020-04-19: 140 mg via INTRAVENOUS

## 2020-04-19 MED ORDER — ACETAMINOPHEN 325 MG PO TABS: 650.0000 mg | ORAL_TABLET | ORAL | Status: DC | PRN

## 2020-04-19 MED ORDER — EPTIFIBATIDE 20 MG/10ML IV SOLN
INTRAVENOUS | Status: AC | PRN
  Administered 2020-04-19 (×5): 1.5 mg via INTRAVENOUS

## 2020-04-19 MED ORDER — PHENYLEPHRINE HCL-NACL 10-0.9 MG/250ML-% IV SOLN
INTRAVENOUS | Status: DC | PRN
  Administered 2020-04-19: 20 ug/min via INTRAVENOUS

## 2020-04-19 MED ORDER — HYDROCOD POLST-CPM POLST ER 10-8 MG/5ML PO SUER
5.0000 mL | Freq: Two times a day (BID) | ORAL | Status: DC | PRN
  Administered 2020-04-19: 5 mL via ORAL
  Filled 2020-04-19: qty 5

## 2020-04-19 MED ORDER — SODIUM CHLORIDE 0.9 % IV SOLN: INTRAVENOUS | Status: DC

## 2020-04-19 MED ORDER — TICAGRELOR 90 MG PO TABS
ORAL_TABLET | ORAL | Status: AC
  Filled 2020-04-19: qty 2

## 2020-04-19 MED ORDER — ASPIRIN 81 MG PO CHEW
324.0000 mg | CHEWABLE_TABLET | Freq: Every day | ORAL | Status: DC
  Administered 2020-04-20 – 2020-04-22 (×3): 324 mg
  Filled 2020-04-19 (×3): qty 4

## 2020-04-19 MED ORDER — CANGRELOR TETRASODIUM 50 MG IV SOLR
INTRAVENOUS | Status: AC
  Filled 2020-04-19: qty 50

## 2020-04-19 MED ORDER — FENTANYL CITRATE (PF) 100 MCG/2ML IJ SOLN: 50.0000 ug | INTRAMUSCULAR | Status: DC | PRN

## 2020-04-19 MED ORDER — TIROFIBAN HCL IN NACL 5-0.9 MG/100ML-% IV SOLN
INTRAVENOUS | Status: AC
  Filled 2020-04-19: qty 100

## 2020-04-19 MED ORDER — ROCURONIUM BROMIDE 10 MG/ML (PF) SYRINGE
PREFILLED_SYRINGE | INTRAVENOUS | Status: DC | PRN
  Administered 2020-04-19: 100 mg via INTRAVENOUS
  Administered 2020-04-19 (×2): 50 mg via INTRAVENOUS

## 2020-04-19 MED ORDER — ACETAMINOPHEN 650 MG RE SUPP: 650.0000 mg | RECTAL | Status: DC | PRN

## 2020-04-19 MED ORDER — CHLORHEXIDINE GLUCONATE CLOTH 2 % EX PADS
6.0000 | MEDICATED_PAD | Freq: Every day | CUTANEOUS | Status: DC
  Administered 2020-04-19 – 2020-04-24 (×5): 6 via TOPICAL

## 2020-04-19 MED ORDER — VERAPAMIL HCL 2.5 MG/ML IV SOLN
INTRAVENOUS | Status: AC | PRN
  Administered 2020-04-19: 2.5 mg via INTRAVENOUS

## 2020-04-19 MED ORDER — PROPOFOL 500 MG/50ML IV EMUL
INTRAVENOUS | Status: DC | PRN
  Administered 2020-04-19: 30 ug/kg/min via INTRAVENOUS

## 2020-04-19 MED ORDER — PHENYLEPHRINE 40 MCG/ML (10ML) SYRINGE FOR IV PUSH (FOR BLOOD PRESSURE SUPPORT)
PREFILLED_SYRINGE | INTRAVENOUS | Status: DC | PRN
  Administered 2020-04-19 (×3): 80 ug via INTRAVENOUS

## 2020-04-19 MED ORDER — VERAPAMIL HCL 2.5 MG/ML IV SOLN
INTRAVENOUS | Status: AC
  Filled 2020-04-19: qty 2

## 2020-04-19 MED ORDER — POLYETHYLENE GLYCOL 3350 17 G PO PACK: 17.0000 g | PACK | Freq: Every day | ORAL | Status: DC

## 2020-04-19 MED ORDER — SODIUM CHLORIDE (PF) 0.9 % IJ SOLN
INTRAVENOUS | Status: AC | PRN
  Administered 2020-04-19 (×2): 25 ug via INTRA_ARTERIAL

## 2020-04-19 MED ORDER — INSULIN GLARGINE 100 UNIT/ML ~~LOC~~ SOLN
20.0000 [IU] | Freq: Every day | SUBCUTANEOUS | Status: DC
  Filled 2020-04-19: qty 0.2

## 2020-04-19 MED ORDER — CLOPIDOGREL BISULFATE 300 MG PO TABS
ORAL_TABLET | ORAL | Status: AC
  Filled 2020-04-19: qty 1

## 2020-04-19 MED ORDER — CLEVIDIPINE BUTYRATE 0.5 MG/ML IV EMUL
0.0000 mg/h | INTRAVENOUS | Status: DC
  Administered 2020-04-19: 10 mg/h via INTRAVENOUS
  Filled 2020-04-19: qty 100

## 2020-04-19 MED ORDER — EPTIFIBATIDE 20 MG/10ML IV SOLN
INTRAVENOUS | Status: AC
  Filled 2020-04-19: qty 10

## 2020-04-19 MED ORDER — DOCUSATE SODIUM 50 MG/5ML PO LIQD: 100.0000 mg | Freq: Two times a day (BID) | ORAL | Status: DC

## 2020-04-19 MED ORDER — SODIUM CHLORIDE 0.9 % IV SOLN: 250.0000 mL | INTRAVENOUS | Status: DC

## 2020-04-19 MED ORDER — CEFAZOLIN SODIUM-DEXTROSE 2-3 GM-%(50ML) IV SOLR
INTRAVENOUS | Status: DC | PRN
  Administered 2020-04-19: 2 g via INTRAVENOUS

## 2020-04-19 MED ORDER — PHENYLEPHRINE HCL-NACL 10-0.9 MG/250ML-% IV SOLN
25.0000 ug/min | INTRAVENOUS | Status: DC
  Administered 2020-04-20: 25 ug/min via INTRAVENOUS
  Filled 2020-04-19 (×2): qty 250

## 2020-04-19 MED ORDER — ASPIRIN 300 MG RE SUPP
300.0000 mg | Freq: Once | RECTAL | Status: AC
  Administered 2020-04-19: 300 mg via RECTAL
  Filled 2020-04-19: qty 1

## 2020-04-19 MED ORDER — SODIUM CHLORIDE 0.9 % IV SOLN: INTRAVENOUS | Status: DC | PRN

## 2020-04-19 MED ORDER — ASPIRIN 325 MG PO TABS: 325.0000 mg | ORAL_TABLET | Freq: Every day | ORAL | Status: DC

## 2020-04-19 MED ORDER — IOHEXOL 300 MG/ML  SOLN: 50.0000 mL | Freq: Once | INTRAMUSCULAR | Status: DC | PRN

## 2020-04-19 MED ORDER — CEFAZOLIN SODIUM-DEXTROSE 2-4 GM/100ML-% IV SOLN
INTRAVENOUS | Status: AC
  Filled 2020-04-19: qty 100

## 2020-04-19 MED ORDER — PHENYLEPHRINE HCL-NACL 10-0.9 MG/250ML-% IV SOLN
INTRAVENOUS | Status: AC
  Filled 2020-04-19: qty 250

## 2020-04-19 MED ORDER — PROPOFOL 1000 MG/100ML IV EMUL
5.0000 ug/kg/min | INTRAVENOUS | Status: DC
  Administered 2020-04-19: 30 ug/kg/min via INTRAVENOUS

## 2020-04-19 MED ORDER — LIDOCAINE 2% (20 MG/ML) 5 ML SYRINGE
INTRAMUSCULAR | Status: DC | PRN
  Administered 2020-04-19: 100 mg via INTRAVENOUS

## 2020-04-19 MED ORDER — ACETAMINOPHEN 160 MG/5ML PO SOLN
650.0000 mg | ORAL | Status: DC | PRN
  Administered 2020-04-21 – 2020-04-22 (×2): 650 mg
  Filled 2020-04-19 (×2): qty 20.3

## 2020-04-19 MED ORDER — LIDOCAINE HCL (PF) 1 % IJ SOLN
INTRAMUSCULAR | Status: AC | PRN
  Administered 2020-04-19: 5 mL

## 2020-04-19 MED ORDER — MIDAZOLAM HCL 2 MG/2ML IJ SOLN
2.0000 mg | INTRAMUSCULAR | Status: DC | PRN
  Administered 2020-04-22: 2 mg via INTRAVENOUS
  Filled 2020-04-19 (×2): qty 2

## 2020-04-19 MED ORDER — IOHEXOL 350 MG/ML SOLN
60.0000 mL | Freq: Once | INTRAVENOUS | Status: AC | PRN
  Administered 2020-04-19: 60 mL via INTRAVENOUS

## 2020-04-19 MED ORDER — NITROGLYCERIN 1 MG/10 ML FOR IR/CATH LAB
INTRA_ARTERIAL | Status: AC
  Filled 2020-04-19: qty 10

## 2020-04-19 MED ORDER — BENZONATATE 100 MG PO CAPS
200.0000 mg | ORAL_CAPSULE | Freq: Three times a day (TID) | ORAL | Status: DC
  Administered 2020-04-19: 200 mg via ORAL
  Filled 2020-04-19 (×2): qty 2

## 2020-04-19 MED ORDER — STROKE: EARLY STAGES OF RECOVERY BOOK
Freq: Once | Status: AC
  Filled 2020-04-19: qty 1

## 2020-04-19 MED ORDER — PROPOFOL 10 MG/ML IV BOLUS
INTRAVENOUS | Status: DC | PRN
  Administered 2020-04-19: 50 mg via INTRAVENOUS
  Administered 2020-04-19: 100 mg via INTRAVENOUS
  Administered 2020-04-19: 50 mg via INTRAVENOUS

## 2020-04-19 NOTE — Progress Notes (Signed)
Writer was called by PT in the patient's room. Patient was resting in bed with weakness on left side. Pronator drift present on left hand and left leg, sensation and strength decreased on left side. Patient alert and oriented x 3, able to verbalize his needs. No complaints of pain. BP 170/85 (manual), sat O2 89% on 10 L HFNC. MD notified. Code stroke was called. RR called.

## 2020-04-19 NOTE — Procedures (Signed)
S/P RT Vert A  And bilateral common carotid arteriograms. RT CFA approach. S/Complete revascularization of RT PCA with x 1 pass with contact aspiration achieving a TICI 3 revascularization. S/P Balloon angioplasty of severe stenosis of Lt VBJ with a 3.18mm x 15 mm OTW balloon achieving a 60 to 70  % patency. Post CT brain x 2 No ICH or mass effect.or hydrocephalus. Patient remains intubated due to COVID 19. Rt groin hemostasis with a 70F exoseal with manual compression. Distal pulses all dopplerable. Meds   7.5 mg of integrelin IA and 300 mg rectal aspirin. S.Anner Baity MD

## 2020-04-19 NOTE — Anesthesia Preprocedure Evaluation (Addendum)
Anesthesia Evaluation  Patient identified by MRN, date of birth, ID band Patient awake    Reviewed: Allergy & Precautions  History of Anesthesia Complications Negative for: history of anesthetic complications  Airway Mallampati: II  TM Distance: >3 FB Neck ROM: Full    Dental no notable dental hx. (+) Dental Advisory Given   Pulmonary former smoker,  COVID positive   Pulmonary exam normal        Cardiovascular hypertension, Pt. on medications + CAD, + Past MI, + Cardiac Stents and + Peripheral Vascular Disease  Normal cardiovascular exam+ dysrhythmias Atrial Fibrillation   01/2020 ECHO: EF 45-50%. LVmildly decreased function. Grade I diastolic dysfunction (impaired relaxation). Akinesis of the left ventricular, basal-mid inferior wall and inferolateral wall, hypokinesis of basal inferoseptal wall, no significant valvular abnormalities   Neuro/Psych    GI/Hepatic GERD  Medicated,Esophageal cancer   Endo/Other  diabetes (glu 129), Insulin Dependent, Oral Hypoglycemic Agents  Renal/GU Renal InsufficiencyRenal disease     Musculoskeletal   Abdominal   Peds  Hematology eliquis   Anesthesia Other Findings   Reproductive/Obstetrics                            Anesthesia Physical Anesthesia Plan  ASA: IV and emergent  Anesthesia Plan: General   Post-op Pain Management:    Induction: Intravenous  PONV Risk Score and Plan:   Airway Management Planned: Oral ETT  Additional Equipment: Arterial line  Intra-op Plan:   Post-operative Plan: Post-operative intubation/ventilation  Informed Consent: I have reviewed the patients History and Physical, chart, labs and discussed the procedure including the risks, benefits and alternatives for the proposed anesthesia with the patient or authorized representative who has indicated his/her understanding and acceptance.     Dental advisory  given  Plan Discussed with: CRNA, Anesthesiologist and Surgeon  Anesthesia Plan Comments:        Anesthesia Quick Evaluation

## 2020-04-19 NOTE — Anesthesia Procedure Notes (Signed)
Arterial Line Insertion Start/End11/09/2019 6:13 PM, 04/19/2020 6:23 PM Performed by: Duane Boston, MD  Patient location: Pre-op. Preanesthetic checklist: patient identified, IV checked, site marked, risks and benefits discussed, surgical consent, monitors and equipment checked, pre-op evaluation, timeout performed and anesthesia consent Left, brachial was placed Catheter size: 20 Fr Hand hygiene performed  and maximum sterile barriers used   Attempts: 1 Procedure performed using ultrasound guided technique. Ultrasound Notes:anatomy identified, needle tip was noted to be adjacent to the nerve/plexus identified and no ultrasound evidence of intravascular and/or intraneural injection Following insertion, dressing applied. Post procedure assessment: normal and unchanged  Patient tolerated the procedure well with no immediate complications.

## 2020-04-19 NOTE — Progress Notes (Addendum)
PROGRESS NOTE                                                                                                                                                                                                             Patient Demographics:    Justin Blackburn, is a 69 y.o. male, DOB - [birth year], XHB:[id number]  Outpatient Primary MD for the patient is [name], Hershal Coria   Admit date - 04/11/2020   LOS - 5  Chief Complaint  Patient presents with  . Shortness of Breath       Brief Narrative: Patient is a 69 y.o. male with PMHx of PAF, CKD stage IIIa, HTN, HLD, PAF, chronic combined systolic and diastolic heart failure, DM-2, CKD stage IIIa, VTE, HLD, HTN, history of adenocarcinoma of the GE junction-s/p radiation/chemo (completed therapy on 12/11/2017)-currently on the active surveillance-presenting with fever, shortness of breath, fatigue-found to have acute hypoxic respiratory failure due to COVID-19 pneumonia  COVID-19 vaccinated status: 9/29>> Pfizer x1st dose  10/20>> Pfizer x 2nd dose  Significant Events: 10/31>> Admit to [facility] for hypoxia due to COVID-19 pneumonia 11/4>> worsening hypoxemia overnight-up to 8 L of HFNC (on 2 L the day before on 11/3)-Actemra given.  Significant studies: 10/31>>Chest x-ray: Bibasilar atelectasis/infiltrate, chronic interstitial lung disease 10/18>> CT abdomen/pelvis: No evidence of esophageal carcinoma recurrence 8/10>> Echo: EF 81-01%, grade 1 diastolic dysfunction.  RV systolic function mildly reduced.  COVID-19 medications: Steroids: 10/31>> Remdesivir: 10/31>> 11/5 Actemra: 11/4 x 1  Antibiotics: Rocephin: 10/31 x1 Zithromax: 10/31 x1  Microbiology data: 10/31 >>blood culture: No growth 10/31>> urine culture: < 10,000 colonies/mL insignificant growth  Procedures: None  Consults: None  DVT prophylaxis: apixaban (ELIQUIS) tablet 5 mg     Subjective:   Lying  comfortably in bed-claims he feels better-but still requiring around 8-10 L of HFNC.   Assessment  & Plan :   Acute Hypoxic Resp Failure due to Covid 19 Viral pneumonia: Hypoxia stable-still with severe hypoxemia requiring around 8-10 L of oxygen.  No signs of volume overload on exam.  Completed Remdesivir on 11/5-remains on steroids.  S/p Actemra yesterday.  Continue to monitor closely-and attempt to titrate off oxygen as tolerated.   Fever: afebrile O2 requirements:  SpO2: 91 % O2 Flow Rate (L/min): 10 L/min   COVID-19 Labs: Recent Labs    04/17/20 0021 04/18/20  0233 04/29/2020 0024  DDIMER 0.85* 1.80* 4.94*  FERRITIN 375* 325 314  CRP 5.1* 2.9* 2.4*       Component Value Date/Time   BNP 261.4 (H) 03/26/2020 2100    Recent Labs  Lab 04/15/20 0450  PROCALCITON <0.10    Lab Results  Component Value Date   SARSCOV2NAA POSITIVE (A) 03/22/2020     Prone/Incentive Spirometry: encouraged  incentive spirometry use 3-4/hour.    Elevated D-dimer: Probably secondary to COVID-19 related inflammation-has been on Eliquis chronically-therefore doubt VTE-however will go ahead and get a lower extremity Doppler.  AKI on CKD stage IIIa: Likely hemodynamically mediated-creatinine fluctuating but close to most recent baseline.  Continue to avoid nephrotoxic agents.  Was given 1 dose of Lasix a few days back- volume status is stable-do not think patient requires any further diuretic therapy today-we will reassess clinically over the next few days.    Has significant proteinuria-likely secondary to diabetes/COVID-19 pneumonia-May need to repeat UA once patient is acute infection is over.    Note-Per spouse/patient-patient apparently is scheduled to see nephrologist in the outpatient setting due to gradually worsening renal function.  Chronic combined systolic/diastolic heart failure: No evidence of decompensation on exam-given 1 dose of Lasix on 11/3-volume status remained stable-no obvious  evidence of volume overload.  Continue clinical monitoring.  PAF: Appears to be in sinus rhythm-resume amiodarone-remains on Eliquis-Per prior documentation cardiology-not on beta-blocker due to bradycardia.  CAD s/p CABG: No anginal symptoms-continue statin-suspect not on aspirin as patient on Eliquis.  HLD: On statin  History of esophageal cancer: Resume outpatient follow-up with oncology-completed treatment-on active surveillance.  CT abdomen/pelvis on 10/18 without any evidence of recurrence.  GERD: Continue PPI  DM-2 with uncontrolled hyperglycemia due to steroids: CBGs better-continue Lantus 30 units daily, 8 units of premeal NovoLog-and SSI.  Follow and adjust.    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   GI prophylaxis: PPI  ABG:    Component Value Date/Time   PHART 7.498 (H) 01/17/2018 1735   PCO2ART 43.3 01/17/2018 1735   PO2ART 57.5 (L) 01/17/2018 1735   HCO3 24.1 04/13/2020 2135   TCO2 25 04/10/2020 2135   ACIDBASEDEF 2.0 03/19/2020 2135   O2SAT 91.0 03/23/2020 2135    Vent Settings: N/A  Condition -  Guarded  Family Communication  :  Spouse- Mardene Celeste Haseman-[phone] updated over the phone on 11/5  Code Status :  Full Code  Diet :  Diet Order            Diet heart healthy/carb modified Room service appropriate? Yes; Fluid consistency: Thin  Diet effective now                  Disposition Plan  :   Status is: Inpatient  Remains inpatient appropriate because:Inpatient level of care appropriate due to severity of illness   Dispo: The patient is from: Home              Anticipated d/c is to: Home              Anticipated d/c date is: 3 days              Patient currently is not medically stable to d/c.   Barriers to discharge: Hypoxia requiring O2 supplementation  Antimicorbials  :    Anti-infectives (From admission, onward)   Start     Dose/Rate Route  Frequency  Ordered Stop   04/16/20 1000  remdesivir 100 mg in sodium chloride 0.9 % 100 mL IVPB        100 mg 200 mL/hr over 30 Minutes Intravenous Daily 03/15/2020 2332 05/01/2020 0953   04/15/20 1000  remdesivir 100 mg in sodium chloride 0.9 % 100 mL IVPB  Status:  Discontinued       "Followed by" Linked Group Details   100 mg 200 mL/hr over 30 Minutes Intravenous Daily 03/29/2020 2333 04/10/2020 2345   03/27/2020 2345  remdesivir 200 mg in sodium chloride 0.9% 250 mL IVPB        200 mg 580 mL/hr over 30 Minutes Intravenous Once 03/25/2020 2331 04/15/20 0834   03/31/2020 2333  remdesivir 200 mg in sodium chloride 0.9% 250 mL IVPB  Status:  Discontinued       "Followed by" Linked Group Details   200 mg 580 mL/hr over 30 Minutes Intravenous Once 03/26/2020 2333 04/09/2020 2345   03/26/2020 2130  cefTRIAXone (ROCEPHIN) 2 g in sodium chloride 0.9 % 100 mL IVPB  Status:  Discontinued        2 g 200 mL/hr over 30 Minutes Intravenous Every 24 hours 03/23/2020 2120 04/15/20 1147   03/23/2020 2130  azithromycin (ZITHROMAX) 500 mg in sodium chloride 0.9 % 250 mL IVPB  Status:  Discontinued        500 mg 250 mL/hr over 60 Minutes Intravenous Every 24 hours 03/21/2020 2120 04/15/20 1147      Inpatient Medications  Scheduled Meds: . amiodarone  200 mg Oral Daily  . apixaban  5 mg Oral BID  . benzonatate  200 mg Oral TID  . gabapentin  300 mg Oral BID  . insulin aspart  0-15 Units Subcutaneous TID WC  . insulin aspart  0-5 Units Subcutaneous QHS  . insulin aspart  8 Units Subcutaneous TID WC  . insulin glargine  40 Units Subcutaneous Daily  . Ipratropium-Albuterol  1 puff Inhalation BID  . mouth rinse  15 mL Mouth Rinse BID  . methylPREDNISolone (SOLU-MEDROL) injection  50 mg Intravenous Q12H  . pantoprazole  40 mg Oral Daily  . rosuvastatin  40 mg Oral Daily  . sodium chloride flush  3 mL Intravenous Q12H   Continuous Infusions: . sodium chloride     PRN Meds:.sodium chloride, chlorpheniramine-HYDROcodone,  Ipratropium-Albuterol, nitroGLYCERIN, ondansetron **OR** ondansetron (ZOFRAN) IV, sodium chloride flush   Time Spent in minutes  25  See all Orders from today for further details   Oren Binet M.D on 05/11/2020 at 9:59 AM  To page go to [email or web address] - use universal password  [facility] -  Office  [phone]    Objective:   Vitals:   04/18/20 2122 04/30/2020 0403 04/18/2020 0700 05/09/2020 0800  BP:  (!) 169/91    Pulse:  64 71 78  Resp:  (!) 21 (!) 22 17  Temp:  97.6 F (36.4 C)    TempSrc:  Oral    SpO2: 92% 93% 92% 91%  Weight:      Height:        Wt Readings from Last 3 Encounters:  03/26/2020 94.3 kg  03/14/20 94.6 kg  12/11/19 94.8 kg     Intake/Output Summary (Last 24 hours) at 05/13/2020 0959 Last data filed at 05/12/2020 0404 Gross per 24 hour  Intake 440 ml  Output 1100 ml  Net -660 ml     Physical Exam Gen Exam:Alert awake-not in any distress HEENT:atraumatic, normocephalic Chest: B/L clear to auscultation anteriorly  CVS:S1S2 regular Abdomen:soft non tender, non distended Extremities:no edema Neurology: Non focal Skin: no rash    Data Review:    CBC Recent Labs  Lab 04/15/20 0450 04/16/20 0257 04/17/20 0021 04/18/20 0233 05/02/2020 0024  WBC 7.2 8.0 11.4* 14.9* 14.2*  HGB 12.4* 11.7* 12.1* 12.6* 13.7  HCT 40.2 36.8* 37.5* 38.3* 42.8  PLT 255 288 372 380 397  MCV 83.6 80.9 82.1 80.5 80.8  MCH 25.8* 25.7* 26.5 26.5 25.8*  MCHC 30.8 31.8 32.3 32.9 32.0  RDW 17.4* 17.3* 17.3* 17.2* 17.5*  LYMPHSABS 0.8 0.7 0.6* 0.4* 0.4*  MONOABS 0.3 0.4 0.5 0.7 0.7  EOSABS 0.0 0.0 0.0 0.0 0.0  BASOSABS 0.0 0.0 0.0 0.0 0.0    Chemistries  Recent Labs  Lab 04/15/20 0450 04/16/20 0257 04/17/20 0021 04/18/20 0233 05/07/2020 0024  NA 143 139 137 138 141  K 4.3 3.9 4.3 3.8 3.8  CL 109 109 104 105 105  CO2 23 19* 21* 22 23  GLUCOSE 225* 169* 181* 168* 149*  BUN 45* 54* 64* 75* 78*  CREATININE 2.93* 2.30* 2.42* 2.79* 2.61*  CALCIUM 8.6*  8.5* 8.8* 8.7* 8.9  MG 2.0 2.0 2.0 1.9 2.0  AST 30 25 26 20 20   ALT 15 14 15 15 17   ALKPHOS 68 65 72 82 93  BILITOT 0.9 0.6 0.5 0.5 0.7   ------------------------------------------------------------------------------------------------------------------ No results for input(s): CHOL, HDL, LDLCALC, TRIG, CHOLHDL, LDLDIRECT in the last 72 hours.  Lab Results  Component Value Date   HGBA1C 7.0 (H) 04/15/2020   ------------------------------------------------------------------------------------------------------------------ No results for input(s): TSH, T4TOTAL, T3FREE, THYROIDAB in the last 72 hours.  Invalid input(s): FREET3 ------------------------------------------------------------------------------------------------------------------ Recent Labs    04/18/20 0233 04/25/2020 0024  FERRITIN 325 314    Coagulation profile Recent Labs  Lab 03/24/2020 2119  INR 1.4*    Recent Labs    04/18/20 0233 04/16/2020 0024  DDIMER 1.80* 4.94*    Cardiac Enzymes No results for input(s): CKMB, TROPONINI, MYOGLOBIN in the last 168 hours.  Invalid input(s): CK ------------------------------------------------------------------------------------------------------------------    Component Value Date/Time   BNP 261.4 (H) 04/12/2020 2100    Micro Results Recent Results (from the past 240 hour(s))  Respiratory Panel by RT PCR (Flu A&B, Covid) - Nasopharyngeal Swab     Status: Abnormal   Collection Time: 04/01/2020  9:00 PM   Specimen: Nasopharyngeal Swab  Result Value Ref Range Status   SARS Coronavirus 2 by RT PCR POSITIVE (A) NEGATIVE Final    Comment: RESULT CALLED TO, READ BACK BY AND VERIFIED WITH: RN C BLACK @2258  03/21/2020 BY S GEZAHEGN (NOTE) SARS-CoV-2 target nucleic acids are DETECTED.  SARS-CoV-2 RNA is generally detectable in upper respiratory specimens  during the acute phase of infection. Positive results are indicative of the presence of the identified virus, but do not  rule out bacterial infection or co-infection with other pathogens not detected by the test. Clinical correlation with patient history and other diagnostic information is necessary to determine patient infection status. The expected result is Negative.  Fact Sheet for Patients:  PinkCheek.be  Fact Sheet for Healthcare Providers: GravelBags.it  This test is not yet approved or cleared by the [place] FDA and  has been authorized for detection and/or diagnosis of SARS-CoV-2 by FDA under an Emergency Use Authorization (EUA).  This EUA will remain in effect (meaning this test ca n be used) for the duration of  the COVID-19 declaration under Section 564(b)(1) of the Act, 21 U.S.C. section 360bbb-3(b)(1), unless the  authorization is terminated or revoked sooner.      Influenza A by PCR NEGATIVE NEGATIVE Final   Influenza B by PCR NEGATIVE NEGATIVE Final    Comment: (NOTE) The Xpert Xpress SARS-CoV-2/FLU/RSV assay is intended as an aid in  the diagnosis of influenza from Nasopharyngeal swab specimens and  should not be used as a sole basis for treatment. Nasal washings and  aspirates are unacceptable for Xpert Xpress SARS-CoV-2/FLU/RSV  testing.  Fact Sheet for Patients: PinkCheek.be  Fact Sheet for Healthcare Providers: GravelBags.it  This test is not yet approved or cleared by the [place] FDA and  has been authorized for detection and/or diagnosis of SARS-CoV-2 by  FDA under an Emergency Use Authorization (EUA). This EUA will remain  in effect (meaning this test can be used) for the duration of the  Covid-19 declaration under Section 564(b)(1) of the Act, 21  U.S.C. section 360bbb-3(b)(1), unless the authorization is  terminated or revoked. Performed at [facility], [place] [address] [place], [place] [address]   Blood Culture (routine x 2)      Status: None   Collection Time: 03/28/2020  9:20 PM   Specimen: BLOOD  Result Value Ref Range Status   Specimen Description BLOOD LEFT ANTECUBITAL  Final   Special Requests   Final    BOTTLES DRAWN AEROBIC AND ANAEROBIC Blood Culture results may not be optimal due to an inadequate volume of blood received in culture bottles   Culture   Final    NO GROWTH 5 DAYS Performed at [facility], [place] [address] [place], [place] [address]    Report Status 05/14/2020 FINAL  Final  Blood Culture (routine x 2)     Status: None   Collection Time: 03/30/2020  9:31 PM   Specimen: BLOOD RIGHT FOREARM  Result Value Ref Range Status   Specimen Description BLOOD RIGHT FOREARM  Final   Special Requests   Final    BOTTLES DRAWN AEROBIC AND ANAEROBIC Blood Culture results may not be optimal due to an inadequate volume of blood received in culture bottles   Culture   Final    NO GROWTH 5 DAYS Performed at [facility], [place] [address], [place], [place] [address]    Report Status 04/18/2020 FINAL  Final  Urine culture     Status: Abnormal   Collection Time: 04/09/2020 10:20 PM   Specimen: Urine, Random  Result Value Ref Range Status   Specimen Description URINE, RANDOM  Final   Special Requests NONE  Final   Culture (A)  Final    <10,000 COLONIES/mL INSIGNIFICANT GROWTH Performed at [facility], [place] [address] [place], [place] [address]    Report Status 04/16/2020 FINAL  Final    Radiology Reports CT ABDOMEN PELVIS WO CONTRAST  Result Date: 04/02/2020 CLINICAL DATA:  Esophageal cancer. Gastrointestinal cancer. Chemo radiation completed. EXAM: CT ABDOMEN AND PELVIS WITHOUT CONTRAST TECHNIQUE: Multidetector CT imaging of the abdomen and pelvis was performed following the standard protocol without IV contrast. COMPARISON:  CT 09/26/2019 FINDINGS: Lower chest: Lung bases are clear. Hepatobiliary: No focal hepatic lesion. No biliary duct dilatation. Common bile duct is normal. Pancreas:  Pancreas is normal. No ductal dilatation. No pancreatic inflammation. Spleen: Normal spleen Adrenals/urinary tract: Adrenal glands and kidneys are normal. The ureters and bladder normal. Stomach/Bowel: Coarse calcifications along the distal esophagus and GE junction unchanged from prior. No nodularity or mass. No gastrohepatic ligament adenopathy. Stomach, duodenum and small bowel are  normal. Appendix cecum normal. The colon and rectosigmoid colon are normal. Vascular/Lymphatic: Abdominal aorta is normal caliber with atherosclerotic calcification. There is no retroperitoneal or periportal lymphadenopathy. No pelvic lymphadenopathy. Reproductive: Prostate normal Other: No free fluid. Musculoskeletal: Short expansile lesion of the RIGHT tenth rib is unchanged consistent with a healed fracture. IMPRESSION: 1. No evidence esophageal carcinoma recurrence. 2. Coarse calcifications through the GE junction not changed from prior. 3. No evidence of gastrohepatic ligament lymphadenopathy. 4. No evidence of liver metastasis on noncontrast exam. 5. Aortic Atherosclerosis (ICD10-I70.0). Electronically Signed   By: Suzy Bouchard M.D.   On: 04/02/2020 08:40   DG Chest Port 1 View  Result Date: 04/17/2020 CLINICAL DATA:  Shortness of breath, COVID positive. EXAM: PORTABLE CHEST 1 VIEW COMPARISON:  04/13/2020 and CT chest 09/26/2019. FINDINGS: Trachea is midline. Heart is enlarged, as before. There is mixed interstitial and airspace opacification, bibasilar predominant, similar to 04/01/2020. Difficult to exclude bilateral pleural effusions. IMPRESSION: Mixed interstitial and airspace opacification, mildly basilar predominant. Possible small bilateral pleural effusions. Findings can be seen with congestive heart failure and/or COVID-19 pneumonia. Electronically Signed   By: Lorin Picket M.D.   On: 04/17/2020 08:23   DG Chest Port 1 View  Result Date: 03/17/2020 CLINICAL DATA:  Weakness and shortness of breath. EXAM:  PORTABLE CHEST 1 VIEW COMPARISON:  January 20, 2018 FINDINGS: Multiple sternal wires and vascular clips are seen. Mild, chronic diffusely increased interstitial lung markings are noted. Mild to moderate severity areas of atelectasis and/or infiltrate are seen within the bilateral lung bases. Small bilateral pleural effusions are seen. No pneumothorax is identified. The cardiac silhouette is moderately enlarged. The visualized skeletal structures are unremarkable. IMPRESSION: 1. Mild to moderate severity bibasilar atelectasis and/or infiltrate. 2. Small bilateral pleural effusions. 3. Chronic interstitial lung disease. 4. Moderate cardiomegaly. Electronically Signed   By: Virgina Norfolk M.D.   On: 03/15/2020 21:22   DG Chest Port 1V same Day  Result Date: 04/18/2020 CLINICAL DATA:  Shortness of breath, COVID EXAM: PORTABLE CHEST 1 VIEW COMPARISON:  04/17/2020 FINDINGS: Low lung volumes. Persistent diffuse bilateral opacities. Unchanged possible small bilateral pleural effusions. No pneumothorax. Cardiomegaly. IMPRESSION: No substantial change in bilateral pulmonary opacities and possible small bilateral pleural effusions with cardiomegaly. May reflect COVID-19 pneumonia and/or edema. Electronically Signed   By: Macy Mis M.D.   On: 04/18/2020 09:32

## 2020-04-19 NOTE — Consult Note (Addendum)
Neurology Consultation Reason for Consult: Code stroke Referring Physician: Oren Binet   CC: Left sided weakness  History is obtained from: Patient, wife and chart review   HPI: Justin Blackburn. is a 69 y.o. male with a past medical history significant for A. 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  He completed his Pfizer vaccinations on 9/29 and 10/20, but became symptomatic with Covid on 10/28.  He was admitted on 10/31 for hypoxia.  Overall his course was felt to have been an uncomplicated one though his D-dimer was rising and there was plan for lower extremity duplex to evaluate for DVT today.  He was last seen well by nursing at 2 PM, and on PT evaluation at 4:15 PM he was found to not be moving the left side well for which code stroke was activated.  As he last received Eliquis in the morning he was not a TPA candidate but head CT revealed no clear acute process and CTA revealed a P2 cut off for which he was taken to thrombectomy  LKW: 2 PM, time of discovery 4:15 PM tPA given?: No, due to Eliquis IA performed?: Yes Premorbid modified rankin scale:      0 - No symptoms.  ROS: Limited ROS performed given emergent intervention required. Patient denied headache, double vision, nausea, vomitting, abdominal pain  Past Medical History:  Diagnosis Date  . A-fib ([place])    with RVR  . Acute kidney injury ([place])   . Anemia   . Angina   . Arthritis    "hands" (08/10/2017)  . Atrial flutter, paroxysmal ([place])    arrived with aflutter with RVR, EF down to 25-30%, s/p TEE DCCV on 01/29/2015  . Cancer ([place])   . CHF (congestive heart failure) ([place])   . Chronic lower back pain    "all the time  from my sciatic nerve"  . Coronary artery disease   . DVT (deep venous thrombosis) ([place])   . Dyspnea   . Erectile dysfunction   . Heart failure ([place])   . Hypercholesteremia   . Hypertension   . 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 Myocardial infarction ([place]) 1988; ~2119;  ~1996;~ 2000  . Obesity   . Peripheral vascular disease ([place])   . Umbilical hernia    unrepaired   Past Surgical History:  Procedure Laterality Date  . CARDIOVERSION N/A 01/29/2015   Procedure: CARDIOVERSION;  Surgeon: [name], MD;  Location: [facility];  Service: Cardiovascular;  Laterality: N/A;  . CARDIOVERSION N/A 04/26/2018   Procedure: CARDIOVERSION;  Surgeon: Josue Hector, MD;  Location: [facility] ENDOSCOPY;  Service: Cardiovascular;  Laterality: N/A;  . CORONARY ANGIOPLASTY WITH STENT PLACEMENT  08/31/11   "2 today; makes total of 3 stents"  . CORONARY ANGIOPLASTY WITH STENT PLACEMENT  08/10/2017   "2 today; makes a total of 5 stents" (08/10/2017)  . CORONARY ARTERY BYPASS GRAFT  1998; 2007   CABG X 4; CABG X4  . CORONARY STENT INTERVENTION N/A 08/10/2017   Procedure: CORONARY STENT INTERVENTION;  Surgeon: Sherren Mocha, MD;  Location: [place] CV LAB;  Service: Cardiovascular;  Laterality: N/A;  . ESOPHAGOGASTRODUODENOSCOPY (EGD) WITH PROPOFOL N/A 09/01/2017   Procedure: ESOPHAGOGASTRODUODENOSCOPY (EGD) WITH PROPOFOL;  Surgeon: Laurence Spates, MD;  Location: [facility];  Service: Endoscopy;  Laterality: N/A;  . EUS N/A 09/29/2017   Procedure: UPPER ENDOSCOPIC ULTRASOUND (EUS) LINEAR;  Surgeon: Arta Silence, MD;  Location: [facility] ENDOSCOPY;  Service: Endoscopy;  Laterality: N/A;  . LEFT HEART CATH AND CORS/GRAFTS ANGIOGRAPHY N/A 08/10/2017   Procedure: LEFT HEART CATH AND CORS/GRAFTS ANGIOGRAPHY;  Surgeon: Sherren Mocha, MD;  Location: [place] CV LAB;  Service: Cardiovascular;  Laterality: N/A;  . LEFT HEART CATHETERIZATION WITH CORONARY/GRAFT ANGIOGRAM N/A 08/31/2011    Procedure: LEFT HEART CATHETERIZATION WITH Beatrix Fetters;  Surgeon: [name], MD;  Location: [facility] CATH LAB;  Service: Cardiovascular;  Laterality: N/A;  . PERIPHERAL VASCULAR CATHETERIZATION N/A 04/27/2016   Procedure: Abdominal Aortogram w/Lower Extremity;  Surgeon: Angelia Mould, MD;  Location: [facility] CV LAB;  Service: Cardiovascular;  Laterality: N/A;  . TEE WITHOUT CARDIOVERSION N/A 01/29/2015   Procedure: TRANSESOPHAGEAL ECHOCARDIOGRAM (TEE);  Surgeon: [name], MD;  Location: [facility] ENDOSCOPY;  Service: Cardiovascular;  Laterality: N/A;    Family History  Problem Relation Age of Onset  . Heart disease Mother   . Heart attack Mother   . Diabetes Mother   . Heart disease Father   . Heart attack Father   . Diabetes Father   . Heart attack Brother   . Stroke Neg Hx     Social History:  reports that he quit smoking about 48 years ago. His smoking use included cigarettes. He has a 12.00 pack-year smoking history. He has never used smokeless tobacco. He reports that he does not drink alcohol and does not use drugs.  Exam: Current vital signs: BP (!) 162/116   Pulse 82   Temp 97.6 F (36.4 C) (Axillary)   Resp (!) 23   Ht 6' (1.829 m)   Wt 94.3 kg   SpO2 (!) 89%   BMI 28.21 kg/m  Vital signs in last 24 hours: Temp:  [97.4 F (36.3 C)-98.1 F (36.7 C)] 97.6 F (36.4 C) (11/05 1626) Pulse Rate:  [64-82] 82 (11/05 1724) Resp:  [17-23] 23 (11/05 1724) BP: (157-179)/(85-116) 162/116 (11/05 1724) SpO2:  [88 %-94 %] 89 % (11/05 1626)   Physical Exam  Constitutional: Appears tired and ill Psych: Affect flat but cooperative Eyes: No scleral injection HENT: No OP obstruction, oxygen cannula in place MSK: Arthritic changes of the hands Cardiovascular: Normal rate and regular rhythm.  Respiratory: Moderately short of breath GI: Soft.  No distension. There is no tenderness.  Skin: Warm dry and intact visible skin  Neuro: Mental  Status: Patient is awake, alert, oriented to person, place, and situation; and month intermittently  No signs of aphasia but intermittently neglects the left side  Cranial Nerves: II: Visual Fields are full but intermittently has difficulty seeing on the left side. Pupils are equal, round, and reactive to light. III,IV, VI: EOMI without ptosis or diplopia, but at times has difficulty tracking to the left V: Facial sensation is symmetric to light touch  VII: Facial movement is symmetric (does not elevate either side much) VIII: hearing is intact to voice XII: tongue is midline without atrophy or fasciculations.  Motor: Tone is low in the LLE > LUE. Right side grossly intact by NIHSS, left side arm is antigravity but drifts halfway down to bed, left leg has trace movement Sensory: Sensation is symmetric to light touch in the arms and legs. Cerebellar: No clear dysmetria in the setting of   NIHSS -- 10 just prior to IA 2 for questions 1 for gaze 1 for  left arm 3 for left leg 1 for right leg 1 for dysarthria 1 for extinction   I have reviewed labs in epic and the results pertinent to this consultation are: Glucose in the 120s,  Cr 2.61 with GFR of 25  CRP has been falling (14.3 -> 2.4)  D-dimer has been rising  (1.03 -> 4.94)  I have reviewed the images obtained:  Head CT w/ chronic microvascular changes and no clear acute process CTA with Right PCA P1/P2 cuttoff  Impression: 69 year old with atheroembolic vs. cardioembolic vs. hypercoagulable (given elevated D-dimer in the setting of COVID19 infection) stroke   Recommendations:  # R PCA stroke s/p IA - Stroke labs HgbA1c, fasting lipid panel - MRI brain  - MRA of the brain without contrast and MRA neck w/wo or CTA  - Frequent neuro checks - Echocardiogram - Carotid dopplers - Prophylactic therapy-Antiplatelet med: Aspirin - dose 325mg  PO or 300mg  PR, followed by 81 mg daily - Consider Plavix 300 mg load with 75 mg daily  for 21 - 90 day course  - Risk factor modification - Telemetry monitoring; 30 day event monitor on discharge if no arrythmias captured  - Blood pressure goal   - Post successful uncomplicated revascularization SBP 120 - 140 for 24 hours; if complications have arisen or only partial revascularization reach out to interventionalist or neurologist on call for BP goal - PT consult, OT consult, Speech consult, unless patient is back to baseline - Stroke team to follow  # Basilar artery stent - Appreciate IR recs   # Afib - Hold AC pending confirmation of final size of stroke   # COVID19, DM and other medical conditions  - Appreciate CCM involvement  # Chronic pain - Holding gabapentin to avoid clouding neuro exam  Lesleigh Noe MD-PhD [facility] [phone]  Addended for charge capture, 55 minutes of critical care time were spent in caring for this patient

## 2020-04-19 NOTE — Progress Notes (Signed)
S: Patient with labile BP and HR  O: BP (!) 171/86   Pulse 86   Temp 97.6 F (36.4 C) (Axillary)   Resp 17   Ht 6' (1.829 m)   Wt 89 kg   SpO2 100%   BMI 26.61 kg/m   Exam: Performed 60 minutes after completion of procedure and 30 minutes after propofol was held Ment: GCS 3t. No responses to any external stimuli. No spontaneous movement.  CN: Pupils equal and briskly reactive 3 mm >> 2 mm bilaterally. Eyes conjugate at the midline. No doll's eye reflex. No corneal reflex. No nystagmus seen. Not overbreathing the vent. Face flaccidly symmetric.  Motor: Flaccid tone x 4. No movement to any stimuli Sensory: No reaction to noxious stimuli in any extremity Reflexes: 0 throughout. Toes mute bilaterally.   A/R: 69 year old male with acute right PCA stroke, underlying etiology atheroembolic vs. cardioembolic vs. hypercoagulable (given elevated D-dimer in the setting of COVID19 infection). He is s/p complete revascularization of RT PCA achieving a TICI 3 revascularization, S/P Balloon angioplasty of severe stenosis of Lt VBJ with a 3.50mm x 15 mm OTW balloon achieving a 60 - 70% patency. -- Post-procedure CT brain x 2 in VIR with no ICH, mass effect.or hydrocephalus. -- Post-procedure exam 30 minutes after sedation held is worrisome for possible brainstem stroke. However, delayed clearance of anesthetic/sedative/paralytic used during the procedure is also possible given significant impairment of renal function.  -- Obtaining STAT MRI brain with MRA head -- Discussed with Dr. Estanislado Pandy and family  40 minutes of additional ICU time for this follow up evaluation. Time spent discussing current exam, imaging studies and prognosis with family was > 50%. Initial consult time by Dr. Curly Shores was 4:36 to 5:30 PM (54 minutes).   Electronically signed: Dr. Kerney Elbe

## 2020-04-19 NOTE — Progress Notes (Signed)
[facility] Progress Note Patient Name: Justin Blackburn. DOB: 1950-08-17 MRN: [id number]   Date of Service  05/01/2020  HPI/Events of Note  Patient admitted with right PCA occlusion and s/p thrombectomy by neuro-interventionalist, patient also has Covid pneumonia and acute respiratory failure on the ventilator.  eICU Interventions  New Patient Evaluation completed, PCCM ground crew requested to see patient.        Kerry Kass Delinda Malan 05/12/2020, 9:44 PM

## 2020-04-19 NOTE — Progress Notes (Signed)
Patient was noted with left-sided weakness at 4:15 PM, last noted normal at 2 PM, code stroke was called, evidence of right PCA occlusion, plan for IR intervention, wife was updated. Phillips Climes MD

## 2020-04-19 NOTE — Progress Notes (Signed)
Physical Therapy Treatment Patient Details Name: Justin Blackburn. MRN: [id number] DOB: 07-Jun-1951 Today's Date: 05/08/2020    History of Present Illness 69 yo male presenting to ED via EMS with fever and shortness of breath. Tested COVID-19 positive; vaccinated. PMH including A-fib, CAD, diabetes,  ischemic cardiomyopathy, CKD stage lll, DVT on chronic anticoagulation, hyperlipidemia, HTN, esophageal cancer, and anemia of chronic disease.  L sided weakness present during session on 05/07/2020 thought to be new.  Code stroke called by RN. Awaiting results.    PT Comments    Pt reports fatigue, flat, on 10.5 L O2 via HFNC, not really feeling up to sitting EOB or getting OOB to chair with me today.  When I convinced him to scoot up to the [facility] for better positioning, I noticed he was not moving or using his left side.  On further assessment it was consistent.  When I asked him to move his left he would move his right.  This was not reported in previous therapy notes, so I called the RN in to help assess.  Left pt with multiple RNs in room and MD coming down the hall.  Ultimately, code stroke was called.  PT to re-assess once we know more on Monday (05/12/2020).    Follow Up Recommendations  Home health PT;Supervision/Assistance - 24 hour (will re-assess Monday)     Equipment Recommendations  Wheelchair (measurements PT);Wheelchair cushion (measurements PT);3in1 (PT)    Recommendations for Other Services       Precautions / Restrictions Precautions Precautions: Other (comment);Fall Precaution Comments: monitor sats, new L sided weakness.    Mobility  Bed Mobility Overal bed mobility: Needs Assistance             General bed mobility comments: Helped pt to move up in bed with bed in trendelenberg as he did not want to mobilize OOB or EOB due to feeling poorly today.  This is when I noticed that he was not using or moving his left arm and leg to help with moving up.  I lifted his arm up to  help and it dropped slowly back to the bed. I asked pt to squeeze my hand on the left and he squeezed his hand on the right.  I repeated "your other side" and he kept squeezing his right hand.  Same thing with his leg.  RN called and came in to assess.  MD notified and ultimately code stroke called.   Transfers                    Ambulation/Gait                 Stairs             Wheelchair Mobility    Modified Rankin (Stroke Patients Only)       Balance                                            Cognition Arousal/Alertness: Lethargic Behavior During Therapy: Flat affect Overall Cognitive Status: Impaired/Different from baseline Area of Impairment: Awareness;Problem solving;Orientation                 Orientation Level: Time         Awareness: Intellectual Problem Solving: Slow processing General Comments: Pt slow to process, not very aware of his left side, kept moving his  right side when asked.  Did not know day of the week, but knew his name, where he was, birthday.      Exercises Other Exercises Other Exercises: Attempted to get him to do IS, but he could not process or sequence through the task well (did not close mouth around tube, could not sequence to do that while inhaling).      General Comments General comments (skin integrity, edema, etc.): Pt on 10.5 L O2 HFNC with sats in the upper 80s low 90s.  RR in the 20-30s.        Pertinent Vitals/Pain Pain Assessment: No/denies pain    Home Living                      Prior Function            PT Goals (current goals can now be found in the care plan section) Acute Rehab PT Goals Patient Stated Goal: Pt lethargic, reporting being tired and weak Progress towards PT goals: Not progressing toward goals - comment;PT to reassess next treatment (new onset L sided weakness.)    Frequency    Min 3X/week (may need to increase to 4xs once stroke  confirmed)      PT Plan Current plan remains appropriate;Other (comment) (will re-assess d/c plan on Monday once further workup comple)    Co-evaluation              AM-PAC PT "6 Clicks" Mobility   Outcome Measure  Help needed turning from your back to your side while in a flat bed without using bedrails?: A Lot Help needed moving from lying on your back to sitting on the side of a flat bed without using bedrails?: A Lot Help needed moving to and from a bed to a chair (including a wheelchair)?: A Lot Help needed standing up from a chair using your arms (e.g., wheelchair or bedside chair)?: A Lot Help needed to walk in hospital room?: Total Help needed climbing 3-5 steps with a railing? : Total 6 Click Score: 10    End of Session Equipment Utilized During Treatment: Oxygen Activity Tolerance: Patient limited by fatigue;Other (comment) (new onset L sided weakness) Patient left: in bed;with call bell/phone within reach;with bed alarm set;with nursing/sitter in room Nurse Communication: Other (comment) (new L sided weakness.) PT Visit Diagnosis: Muscle weakness (generalized) (M62.81);Difficulty in walking, not elsewhere classified (R26.2)     Time: 2956-2130 (only charged 1 unit as most time spent assessing him ) PT Time Calculation (min) (ACUTE ONLY): 28 min  Charges:  $Therapeutic Activity: 8-22 mins                     Verdene Lennert, PT, DPT  Acute Rehabilitation [phone] pager [phone]) [phone] office

## 2020-04-19 NOTE — Anesthesia Procedure Notes (Signed)
Procedure Name: Intubation Date/Time: 05/01/2020 5:52 PM Performed by: Babs Bertin, CRNA Pre-anesthesia Checklist: Patient identified, Emergency Drugs available, Suction available and Patient being monitored Patient Re-evaluated:Patient Re-evaluated prior to induction Oxygen Delivery Method: Circle system utilized Preoxygenation: Pre-oxygenation with 100% oxygen Induction Type: IV induction Ventilation: Mask ventilation without difficulty Laryngoscope Size: Glidescope and 4 Grade View: Grade I Tube type: Oral Tube size: 7.5 mm Number of attempts: 1 Airway Equipment and Method: Oral airway,  Rigid stylet and Video-laryngoscopy Placement Confirmation: ETT inserted through vocal cords under direct vision,  positive ETCO2 and breath sounds checked- equal and bilateral Secured at: 23 cm Tube secured with: Tape Dental Injury: Teeth and Oropharynx as per pre-operative assessment

## 2020-04-19 NOTE — TOC Progression Note (Signed)
Transition of Care (TOC) - Progression Note    Patient Details  Name: Justin Blackburn. MRN: [id number] Date of Birth: Dec 30, 1950  Transition of Care [facility]) CM/SW [place], RN Phone Number: 05/02/2020, 3:48 PM  Clinical Narrative:     Following up on [facility] services, patient still is on HFNC, did not answer phone. PT recommending walker, which patient already has at home. CM continue to follow for needs  Expected Discharge Plan: [place] Barriers to Discharge: Continued Medical Work up  Expected Discharge Plan and Services Expected Discharge Plan: [place]   Discharge Planning Services: CM Consult   Living arrangements for the past 2 months: Single Family Home                                       Social Determinants of Health (SDOH) Interventions    Readmission Risk Interventions No flowsheet data found.

## 2020-04-19 NOTE — Consult Note (Signed)
NAME:  Justin Fentress., MRN:  [id number], DOB:  May 24, 1951, LOS: 5 ADMISSION DATE:  04/10/2020, CONSULTATION DATE:  11/5 REFERRING MD:  Sloan Leiter, CHIEF COMPLAINT:  Vent mgmt    Brief History   69yo male with hx AFib on Eliquis, CHF, DM, CAD (CABG 2007), HTN, esophageal cancer initially admitted 10/31 with COVID PNA and respiratory failure.  On 11/5 he was noted to have L sided weakness and w/u revealed R PCA occlusion and he was taken for IR revascularization. He remained intubated post procedure and PCCM consulted for ICU management.   History of present illness   69yo partially vaccinated male with hx AFib on Eliquis, CHF, DM, CAD (CABG 2007), HTN, esophageal cancer initially admitted 10/31 with COVID PNA and respiratory failure.  On 11/5 he was noted to have L sided weakness and w/u revealed R PCA occlusion and he was taken for IR revascularization. He remained intubated post procedure and PCCM consulted for ICU management.   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   Significant Hospital Events     Consults:  Neuro>>>  Procedures:  IR revascularization R PCA occlusion 11/5 ETT 11/5>>>  Significant Diagnostic Tests:  CT head/ CTA head/neck 11/5>>> 1. Occlusion right posterior cerebral artery which appears acute.  2. Moderate stenosis distal right vertebral artery and severe stenosis distal left vertebral artery. Moderate stenosis proximal right vertebral artery origin and mild  stenosis origin of left vertebral artery  3. No significant carotid stenosis in the neck. Mild cavernous carotid stenosis on the right and moderate left cavernous carotid Stenosis  4. 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 5. Both middle cerebral arteries are patent without significant stenosis.  Micro Data:  BC x2 10/31>>> neg  Urine 10/31>>> insig growth  COVID 10/31>>> POS   Antimicrobials:    Interim history/subjective:  Post neuro IR, remains on vent, sedated post procedure.   Objective   Blood pressure (!) 162/116, pulse 82, temperature 97.6 F (36.4 C), temperature source Axillary, resp. rate (!) 23, height 6' (1.829 m), weight 94.3 kg, SpO2 99 %.    Vent Mode: PRVC FiO2 (%):  [100 %] 100 % Set Rate:  [15 bmp] 15 bmp Vt Set:  [560 mL] 560 mL PEEP:  [8 cmH20] 8 cmH20 Plateau Pressure:  [23 cmH20] 23 cmH20   Intake/Output Summary (Last 24 hours) at 05/09/2020 2147 Last data filed at 04/29/2020 2102 Gross per 24 hour  Intake 970 ml  Output 260 ml  Net 710 ml   Filed Weights   04/12/2020 2102  Weight: 94.3 kg    Examination: General: chronically ill appearing male, NAD  HENT: mm moist, ETT Lungs: resps even non labored on vent, diminished bases Cardiovascular: s1s2 rrr Abdomen: soft, hypoactive bs  Extremities: warm and dry, no edema  Neuro: sedated post procedure, pupils pinpoint, RASS -4   Resolved Hospital Problem list     Assessment & Plan:  Acute hypoxic respiratory failure - multifactorial in setting covid pna now with stroke and AMS/sedation needs post IR neuro intervention. Prior to procedure had overall stable respiratory status.  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  PLAN -  Neuro following  Echo, carotid dopplers pending  ASA  BP management - goals SBP 120-140 F/u MRI   AFib -  PLAN -  Holding eliquis for now  Continue amiodarone   HTN  PLAN -   goal SBP 120-140 Low dose peripheral neo  Holding propofol gtt   DM  PLAN -  Lantus, SSI  Will half lantus dose while NPO, trend CBG's   Sedation needs on vent  PLAN -  PAD protocol  Hold propofol  PRN fentanyl, PRN versed   Best practice:  Diet: NPO Pain/Anxiety/Delirium protocol (if indicated): ordered VAP protocol (if indicated): ordered DVT prophylaxis: SCD's GI prophylaxis: PPI Glucose control: SSI Mobility: BR Code Status: full Family Communication: per neuro 11/5 Disposition: ICU  Labs   CBC: Recent Labs  Lab 04/15/20 0450 04/16/20 0257 04/17/20 0021 04/18/20 0233 04/16/2020 0024  WBC 7.2 8.0 11.4* 14.9* 14.2*  NEUTROABS 6.0 6.8 10.3* 13.7* 12.9*  HGB 12.4* 11.7* 12.1* 12.6* 13.7  HCT 40.2 36.8* 37.5* 38.3* 42.8  MCV 83.6 80.9 82.1 80.5 80.8  PLT 255 288 372 380 700    Basic Metabolic Panel: Recent Labs  Lab 04/15/20 0450 04/16/20 0257 04/17/20 0021 04/18/20 0233 04/25/2020 0024  NA 143 139 137 138 141  K 4.3 3.9 4.3 3.8 3.8  CL 109 109 104 105 105  CO2 23 19* 21* 22 23  GLUCOSE 225* 169* 181* 168* 149*  BUN 45* 54* 64* 75* 78*  CREATININE 2.93* 2.30* 2.42* 2.79* 2.61*  CALCIUM 8.6* 8.5* 8.8* 8.7* 8.9  MG 2.0 2.0 2.0 1.9 2.0  PHOS 2.8  --   --   --   --    GFR: Estimated Creatinine Clearance: 31 mL/min (A) (by C-G formula based on SCr of 2.61 mg/dL (H)). Recent Labs  Lab 04/03/2020 2100 04/06/2020 2119 04/15/20 0030 04/15/20 0450 04/15/20 0450 04/16/20 0257 04/17/20 0021 04/18/20 0233 05/04/2020 0024  PROCALCITON  --   --   --  <0.10  --   --   --   --   --   WBC   < >  --   --  7.2   <  > 8.0 11.4* 14.9* 14.2*  LATICACIDVEN  --  2.0* 1.5  --   --   --   --   --   --    < > = values in this interval not displayed.    Liver Function Tests: Recent Labs  Lab 04/15/20 0450 04/16/20 0257 04/17/20 0021 04/18/20 0233 04/18/2020 0024  AST 30 25 26 20 20   ALT 15 14 15 15 17   ALKPHOS 68 65 72 82 93  BILITOT 0.9 0.6 0.5 0.5 0.7  PROT 6.3* 5.7* 5.9* 5.9* 6.4*  ALBUMIN 2.7* 2.5* 2.4* 2.5* 2.7*   No results for input(s): LIPASE, AMYLASE in the last 168 hours. No results for input(s): AMMONIA in the last 168 hours.  ABG    Component Value Date/Time   PHART 7.498 (H) 01/17/2018 1735  PCO2ART 43.3 01/17/2018 1735   PO2ART 57.5 (L) 01/17/2018 1735   HCO3 24.1 03/16/2020 2135   TCO2 25 03/29/2020 2135   ACIDBASEDEF 2.0 04/12/2020 2135   O2SAT 91.0 03/30/2020 2135     Coagulation Profile: Recent Labs  Lab 04/05/2020 2119  INR 1.4*    Cardiac Enzymes: No results for input(s): CKTOTAL, CKMB, CKMBINDEX, TROPONINI in the last 168 hours.  HbA1C: Hgb A1c MFr Bld  Date/Time Value Ref Range Status  04/15/2020 04:50 AM 7.0 (H) 4.8 - 5.6 % Final    Comment:    (NOTE) Pre diabetes:          5.7%-6.4%  Diabetes:              >6.4%  Glycemic control for   <7.0% adults with diabetes   01/24/2019 09:11 AM 7.9 (H) 4.8 - 5.6 % Final    Comment:             Prediabetes: 5.7 - 6.4          Diabetes: >6.4          Glycemic control for adults with diabetes: <7.0     CBG: Recent Labs  Lab 04/18/20 1719 04/18/20 2048 05/08/2020 0747 04/15/2020 1227 05/09/2020 2101  GLUCAP 316* 275* 148* 129* 111*    Review of Systems:   Unable, pt sedated on vent.  As per HPI above obtained from records and staff.   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   Surgical History    Past Surgical History:  Procedure Laterality Date  . CARDIOVERSION N/A 01/29/2015   Procedure: CARDIOVERSION;  Surgeon: [name], MD;  Location: [place];  Service: Cardiovascular;  Laterality: N/A;  . CARDIOVERSION N/A 04/26/2018   Procedure: CARDIOVERSION;  Surgeon: Josue Hector, MD;  Location[facility] ENDOSCOPY;  Service: Cardiovascular;  Laterality: N/A;  . CORONARY ANGIOPLASTY WITH STENT PLACEMENT  08/31/11   "2 today; makes total of 3 stents"  . CORONARY ANGIOPLASTY WITH STENT PLACEMENT  08/10/2017   "2 today; makes a total of 5 stents" (08/10/2017)  . CORONARY ARTERY BYPASS GRAFT  1998; 2007   CABG X 4; CABG X4  . CORONARY STENT INTERVENTION N/A 08/10/2017   Procedure: CORONARY STENT INTERVENTION;  Surgeon: Sherren Mocha, MD;  Location: [place] CV LAB;  Service: Cardiovascular;  Laterality: N/A;  . ESOPHAGOGASTRODUODENOSCOPY (EGD) WITH PROPOFOL N/A 09/01/2017   Procedure: ESOPHAGOGASTRODUODENOSCOPY (EGD) WITH PROPOFOL;  Surgeon: Laurence Spates, MD;  Location: [place];  Service: Endoscopy;  Laterality: N/A;  . EUS N/A 09/29/2017   Procedure: UPPER ENDOSCOPIC ULTRASOUND (EUS) LINEAR;  Surgeon: Arta Silence, MD;  Location: [facility] ENDOSCOPY;  Service: Endoscopy;  Laterality: N/A;  . LEFT HEART CATH AND CORS/GRAFTS ANGIOGRAPHY N/A 08/10/2017   Procedure: LEFT HEART CATH AND CORS/GRAFTS ANGIOGRAPHY;  Surgeon: Sherren Mocha, MD;  Location: [place] CV LAB;  Service: Cardiovascular;  Laterality: N/A;  . LEFT HEART CATHETERIZATION WITH CORONARY/GRAFT ANGIOGRAM N/A 08/31/2011   Procedure: LEFT HEART CATHETERIZATION WITH Beatrix Fetters;  Surgeon: [name],  MD;  Location: [facility] CATH LAB;  Service: Cardiovascular;  Laterality: N/A;  . PERIPHERAL VASCULAR CATHETERIZATION N/A 04/27/2016   Procedure: Abdominal Aortogram w/Lower Extremity;  Surgeon: Angelia Mould, MD;  Location: [place] CV LAB;  Service: Cardiovascular;  Laterality: N/A;  . TEE WITHOUT CARDIOVERSION N/A 01/29/2015   Procedure: TRANSESOPHAGEAL ECHOCARDIOGRAM (TEE);  Surgeon: [name], MD;  Location: [facility] ENDOSCOPY;  Service: Cardiovascular;  Laterality: N/A;     Social History   reports that he quit smoking about 48 years ago. His smoking use included cigarettes. He has a 12.00 pack-year smoking history. He has never used smokeless tobacco. He reports that he does not drink alcohol and does not use drugs.   Family History   His family history includes Diabetes in his father and mother; Heart attack in his brother, father, and mother; Heart disease in his father and mother. There is no history of Stroke.   Allergies No Known Allergies   Home Medications  Prior to Admission medications   Medication Sig Start Date End Date Taking? Authorizing Provider  amiodarone (PACERONE) 200 MG tablet TAKE 1 TABLET BY MOUTH EVERY DAY Patient taking differently: Take 200 mg by mouth daily.  03/21/20  Yes Belva Crome, MD  ELIQUIS 5 MG TABS tablet TAKE 1 TABLET BY MOUTH TWICE A DAY 05/11/2020   Belva Crome, MD  gabapentin (NEURONTIN) 300 MG capsule Take 300 mg by mouth 2 (two) times daily.    Yes [provider]  insulin degludec (TRESIBA FLEXTOUCH) 100 UNIT/ML FlexTouch Pen Inject 30 Units into the skin daily.    Yes [provider]  losartan (COZAAR) 50 MG tablet Take 1 tablet (50 mg total) by mouth daily. 03/14/20  Yes Belva Crome, MD  metFORMIN (GLUCOPHAGE) 1000 MG tablet Take 1,000 mg by mouth 2 (two) times daily with a meal. 05/19/18  Yes [provider]  nitroGLYCERIN (NITROSTAT) 0.4 MG SL tablet PLACE 1 TABLET UNDER THE TONGUE EVERY 5 (FIVE) MINUTES  AS NEEDED FOR CHEST PAIN (ANGINA). Patient taking differently: Place 0.4 mg under the tongue every 5 (five) minutes as needed for chest pain.  01/23/20  Yes Belva Crome, MD  pantoprazole (PROTONIX) 40 MG tablet TAKE 1 TABLET BY MOUTH EVERY DAY Patient taking differently: Take 40 mg by mouth daily.  10/23/19  Yes Belva Crome, MD  rosuvastatin (CRESTOR) 40 MG tablet Take 1 tablet (40 mg total) by mouth daily. 04/20/19 05/14/20 Yes Belva Crome, MD     Critical care time: 108mins    Katy Lurene Robley, NP Pulmonary/Critical Care Medicine  05/04/2020  9:47 PM

## 2020-04-19 NOTE — Telephone Encounter (Signed)
Prescription refill request for Eliquis received.  Last office visit: Tamala Julian 03/14/2020 Scr: 2.61, 04/21/2020 Age: 69 yo Weight: 94.3 kg    Prescription refill sent.

## 2020-04-19 NOTE — CV Procedure (Signed)
INR. 69 y RT H M LSW 200pm MRS? New onset Lt sided weakness. CT brain NO ICH.  ASPECTS 10 CTA occluded RT PCA.. On eliquis. Endovascular treatment for revascularization D/W spouse. Procedure,reasonsand alternatives reviewed. Risks of ICH of 10 %,worsening neuro deficit death and inability to revascularize reviewed. Spouse expressed understanding and provided consent to proceed. S.Jarmal Lewelling MD

## 2020-04-19 NOTE — Progress Notes (Addendum)
Pt is 69 yr old male with multiple medical problems including CHF DM, Cancer and A Fib on Eliquis,being treated for Covid pneumonia and CHF on [place]. He was noted by PT to not be using the Left side of his body. His RN subsequently called a code stroke. (Code called at 1636). Stroke team and Neurologist met pt in CT3 where he was brought by RRN and [place] RN and MD. CT head neg for acute hemorrhage per neurologist.Pt not a candidate for TPA due to Eliquis. CTA obtained. Per Neurologist and radiologist, CTA positive for Rt PCA occlusion. Code IR called out at 1730. Pt was previously brought to bay 8 at 1724. Pt being prepped for procedure. Pt's wife allowed to visit by Dr Maurine Minister. Anesthesia arrived to intubate pt at 1748. Brief report given to IR RN Elmyra Ricks. Pt taken to IR suite at 1759. Pt's wife taken to waiting area near atrium by Dr Rory Percy.

## 2020-04-19 NOTE — Care Management Important Message (Signed)
Important Message  Patient Details  Name: Justin Blackburn. MRN: [id number] Date of Birth: 06/18/1950   Medicare Important Message Given:  Yes - Important Message mailed due to current National Emergency  Verbal consent obtained due to current National Emergency  Relationship to patient: Self Contact Name: Elzy Tomasello Call Date: 04/22/2020  Time: 1148 Phone: [phone] Outcome: No Answer/Busy Important Message mailed to: Patient address on file    Delorse Lek 04/28/2020, 11:48 AM

## 2020-04-19 NOTE — Progress Notes (Deleted)
[facility] Progress Note Patient Name: Justin Blackburn. DOB: 1950/12/29 MRN: [id number]   Date of Service  04/16/2020  HPI/Events of Note  BP 169/74, MAP 108, HR 51  eICU Interventions  Cardene infusion ordered to keep SBP < 140 per neurology guidelines.        Kerry Kass Sheriden Archibeque 04/25/2020, 10:04 PM

## 2020-04-19 NOTE — Transfer of Care (Signed)
Immediate Anesthesia Transfer of Care Note  Patient: Justin Blackburn.  Procedure(s) Performed: IR WITH ANESTHESIA (N/A )  Patient Location: ICU  Anesthesia Type:General  Level of Consciousness: sedated and Patient remains intubated per anesthesia plan  Airway & Oxygen Therapy: Patient remains intubated per anesthesia plan and Patient placed on Ventilator (see vital sign flow sheet for setting)  Post-op Assessment: Report given to RN and Post -op Vital signs reviewed and stable  Post vital signs: Reviewed and stable  Last Vitals:  Vitals Value Taken Time  BP 118/76 04/16/2020 2101  Temp    Pulse 55 05/13/2020 2111  Resp 17 04/28/2020 2111  SpO2 99 % 04/17/2020 2111  Vitals shown include unvalidated device data.  Last Pain:  Vitals:   05/07/2020 1626  TempSrc: Axillary  PainSc:          Complications: No complications documented.   Patient transported to ICU with standard monitors (HR, BP, SPO2, RR) and emergency drugs/equipment. Controlled ventilation maintained via ventilator and Respiratory Therapist Tim. Report given to bedside RN and respiratory therapist. Pt connected to ICU monitor. All questions answered and vital signs stable before leaving

## 2020-04-20 ENCOUNTER — Other Ambulatory Visit (HOSPITAL_COMMUNITY): Payer: Medicare HMO

## 2020-04-20 ENCOUNTER — Inpatient Hospital Stay (HOSPITAL_COMMUNITY): Payer: Medicare HMO

## 2020-04-20 DIAGNOSIS — J9601 Acute respiratory failure with hypoxia: Secondary | ICD-10-CM

## 2020-04-20 DIAGNOSIS — R4182 Altered mental status, unspecified: Secondary | ICD-10-CM

## 2020-04-20 DIAGNOSIS — U071 COVID-19: Secondary | ICD-10-CM

## 2020-04-20 DIAGNOSIS — R7989 Other specified abnormal findings of blood chemistry: Secondary | ICD-10-CM

## 2020-04-20 DIAGNOSIS — I6389 Other cerebral infarction: Secondary | ICD-10-CM

## 2020-04-20 DIAGNOSIS — I639 Cerebral infarction, unspecified: Secondary | ICD-10-CM | POA: Diagnosis not present

## 2020-04-20 DIAGNOSIS — J9602 Acute respiratory failure with hypercapnia: Secondary | ICD-10-CM

## 2020-04-20 LAB — BASIC METABOLIC PANEL
Anion gap: 12 (ref 5–15)
BUN: 87 mg/dL — ABNORMAL HIGH (ref 8–23)
CO2: 19 mmol/L — ABNORMAL LOW (ref 22–32)
Calcium: 7.7 mg/dL — ABNORMAL LOW (ref 8.9–10.3)
Chloride: 108 mmol/L (ref 98–111)
Creatinine, Ser: 3.36 mg/dL — ABNORMAL HIGH (ref 0.61–1.24)
GFR, Estimated: 19 mL/min — ABNORMAL LOW (ref >60)
Glucose, Bld: 261 mg/dL — ABNORMAL HIGH (ref 70–99)
Potassium: 4.9 mmol/L (ref 3.5–5.1)
Sodium: 139 mmol/L (ref 135–145)

## 2020-04-20 LAB — COMPREHENSIVE METABOLIC PANEL
ALT: 14 U/L (ref 0–44)
AST: 21 U/L (ref 15–41)
Albumin: 2 g/dL — ABNORMAL LOW (ref 3.5–5.0)
Alkaline Phosphatase: 66 U/L (ref 38–126)
Anion gap: 13 (ref 5–15)
BUN: 70 mg/dL — ABNORMAL HIGH (ref 8–23)
CO2: 17 mmol/L — ABNORMAL LOW (ref 22–32)
Calcium: 6.8 mg/dL — ABNORMAL LOW (ref 8.9–10.3)
Chloride: 112 mmol/L — ABNORMAL HIGH (ref 98–111)
Creatinine, Ser: 2.44 mg/dL — ABNORMAL HIGH (ref 0.61–1.24)
GFR, Estimated: 28 mL/min — ABNORMAL LOW (ref >60)
Glucose, Bld: 250 mg/dL — ABNORMAL HIGH (ref 70–99)
Potassium: 4.5 mmol/L (ref 3.5–5.1)
Sodium: 142 mmol/L (ref 135–145)
Total Bilirubin: 0.7 mg/dL (ref 0.3–1.2)
Total Protein: 4.6 g/dL — ABNORMAL LOW (ref 6.5–8.1)

## 2020-04-20 LAB — GLUCOSE, CAPILLARY
Glucose-Capillary: 159 mg/dL — ABNORMAL HIGH (ref 70–99)
Glucose-Capillary: 232 mg/dL — ABNORMAL HIGH (ref 70–99)
Glucose-Capillary: 252 mg/dL — ABNORMAL HIGH (ref 70–99)
Glucose-Capillary: 259 mg/dL — ABNORMAL HIGH (ref 70–99)
Glucose-Capillary: 263 mg/dL — ABNORMAL HIGH (ref 70–99)
Glucose-Capillary: 273 mg/dL — ABNORMAL HIGH (ref 70–99)

## 2020-04-20 LAB — BLOOD GAS, ARTERIAL
Acid-base deficit: 6 mmol/L — ABNORMAL HIGH (ref 0.0–2.0)
Bicarbonate: 19.6 mmol/L — ABNORMAL LOW (ref 20.0–28.0)
Drawn by: 441261
FIO2: 50
O2 Saturation: 96 %
Patient temperature: 37
pCO2 arterial: 42.7 mmHg (ref 32.0–48.0)
pH, Arterial: 7.283 — ABNORMAL LOW (ref 7.350–7.450)
pO2, Arterial: 90.7 mmHg (ref 83.0–108.0)

## 2020-04-20 LAB — CBC
HCT: 32.9 % — ABNORMAL LOW (ref 39.0–52.0)
Hemoglobin: 10 g/dL — ABNORMAL LOW (ref 13.0–17.0)
MCH: 26 pg (ref 26.0–34.0)
MCHC: 30.4 g/dL (ref 30.0–36.0)
MCV: 85.7 fL (ref 80.0–100.0)
Platelets: 363 10*3/uL (ref 150–400)
RBC: 3.84 MIL/uL — ABNORMAL LOW (ref 4.22–5.81)
RDW: 18.3 % — ABNORMAL HIGH (ref 11.5–15.5)
WBC: 18.6 10*3/uL — ABNORMAL HIGH (ref 4.0–10.5)
nRBC: 0.2 % (ref 0.0–0.2)

## 2020-04-20 LAB — C-REACTIVE PROTEIN: CRP: 1.1 mg/dL — ABNORMAL HIGH (ref <1.0)

## 2020-04-20 LAB — LIPID PANEL
Cholesterol: 94 mg/dL (ref 0–200)
HDL: 25 mg/dL — ABNORMAL LOW (ref >40)
LDL Cholesterol: 43 mg/dL (ref 0–99)
Total CHOL/HDL Ratio: 3.8 RATIO
Triglycerides: 128 mg/dL (ref <150)
VLDL: 26 mg/dL (ref 0–40)

## 2020-04-20 LAB — HEMOGLOBIN A1C
Hgb A1c MFr Bld: 7.4 % — ABNORMAL HIGH (ref 4.8–5.6)
Mean Plasma Glucose: 165.68 mg/dL

## 2020-04-20 LAB — D-DIMER, QUANTITATIVE: D-Dimer, Quant: 14.15 ug/mL-FEU — ABNORMAL HIGH (ref 0.00–0.50)

## 2020-04-20 LAB — TRIGLYCERIDES: Triglycerides: 126 mg/dL (ref <150)

## 2020-04-20 LAB — ECHOCARDIOGRAM COMPLETE
Area-P 1/2: 2.37 cm2
Height: 72 in
Weight: 3139.35 oz

## 2020-04-20 LAB — MRSA PCR SCREENING: MRSA by PCR: NEGATIVE

## 2020-04-20 MED ORDER — INSULIN GLARGINE 100 UNIT/ML ~~LOC~~ SOLN
15.0000 [IU] | Freq: Two times a day (BID) | SUBCUTANEOUS | Status: DC
  Administered 2020-04-20 (×2): 15 [IU] via SUBCUTANEOUS
  Filled 2020-04-20 (×4): qty 0.15

## 2020-04-20 MED ORDER — DOCUSATE SODIUM 50 MG/5ML PO LIQD
100.0000 mg | Freq: Two times a day (BID) | ORAL | Status: DC
  Administered 2020-04-20 – 2020-04-23 (×7): 100 mg
  Filled 2020-04-20 (×6): qty 10

## 2020-04-20 MED ORDER — ROSUVASTATIN CALCIUM 20 MG PO TABS
40.0000 mg | ORAL_TABLET | Freq: Every day | ORAL | Status: DC
  Administered 2020-04-20 – 2020-04-24 (×5): 40 mg
  Filled 2020-04-20 (×6): qty 2

## 2020-04-20 MED ORDER — PANTOPRAZOLE SODIUM 40 MG PO PACK
40.0000 mg | PACK | Freq: Every day | ORAL | Status: DC
  Administered 2020-04-20 – 2020-04-24 (×5): 40 mg
  Filled 2020-04-20 (×5): qty 20

## 2020-04-20 MED ORDER — AMLODIPINE BESYLATE 10 MG PO TABS
10.0000 mg | ORAL_TABLET | Freq: Every day | ORAL | Status: DC
  Administered 2020-04-20 – 2020-04-24 (×5): 10 mg
  Filled 2020-04-20 (×5): qty 1

## 2020-04-20 MED ORDER — CLEVIDIPINE BUTYRATE 0.5 MG/ML IV EMUL
0.0000 mg/h | INTRAVENOUS | Status: DC
  Administered 2020-04-20: 1 mg/h via INTRAVENOUS
  Administered 2020-04-21: 6 mg/h via INTRAVENOUS
  Filled 2020-04-20: qty 50

## 2020-04-20 MED ORDER — JEVITY 1.2 CAL PO LIQD
1000.0000 mL | ORAL | Status: DC
  Administered 2020-04-20 – 2020-04-21 (×2): 1000 mL
  Filled 2020-04-20 (×2): qty 1000

## 2020-04-20 MED ORDER — CHLORHEXIDINE GLUCONATE 0.12% ORAL RINSE (MEDLINE KIT)
15.0000 mL | Freq: Two times a day (BID) | OROMUCOSAL | Status: DC
  Administered 2020-04-20 – 2020-04-23 (×7): 15 mL via OROMUCOSAL

## 2020-04-20 MED ORDER — INSULIN ASPART 100 UNIT/ML ~~LOC~~ SOLN
0.0000 [IU] | SUBCUTANEOUS | Status: DC
  Administered 2020-04-20 (×2): 8 [IU] via SUBCUTANEOUS
  Administered 2020-04-21 (×5): 5 [IU] via SUBCUTANEOUS
  Administered 2020-04-21: 3 [IU] via SUBCUTANEOUS
  Administered 2020-04-21: 5 [IU] via SUBCUTANEOUS
  Administered 2020-04-22 (×2): 2 [IU] via SUBCUTANEOUS
  Administered 2020-04-22 (×2): 3 [IU] via SUBCUTANEOUS
  Administered 2020-04-23: 11 [IU] via SUBCUTANEOUS
  Administered 2020-04-23 (×2): 8 [IU] via SUBCUTANEOUS
  Administered 2020-04-23: 5 [IU] via SUBCUTANEOUS
  Administered 2020-04-23: 11 [IU] via SUBCUTANEOUS
  Administered 2020-04-23: 3 [IU] via SUBCUTANEOUS
  Administered 2020-04-24 (×2): 5 [IU] via SUBCUTANEOUS
  Administered 2020-04-24 (×2): 8 [IU] via SUBCUTANEOUS

## 2020-04-20 MED ORDER — AMIODARONE HCL 200 MG PO TABS
200.0000 mg | ORAL_TABLET | Freq: Every day | ORAL | Status: DC
  Administered 2020-04-20 – 2020-04-24 (×5): 200 mg
  Filled 2020-04-20 (×5): qty 1

## 2020-04-20 MED ORDER — ORAL CARE MOUTH RINSE
15.0000 mL | OROMUCOSAL | Status: DC
  Administered 2020-04-20 – 2020-04-23 (×30): 15 mL via OROMUCOSAL

## 2020-04-20 MED ORDER — POLYETHYLENE GLYCOL 3350 17 G PO PACK
17.0000 g | PACK | Freq: Every day | ORAL | Status: DC
  Administered 2020-04-21 – 2020-04-23 (×3): 17 g
  Filled 2020-04-20 (×3): qty 1

## 2020-04-20 MED ORDER — INSULIN ASPART 100 UNIT/ML ~~LOC~~ SOLN
4.0000 [IU] | SUBCUTANEOUS | Status: DC
  Administered 2020-04-20 – 2020-04-21 (×6): 4 [IU] via SUBCUTANEOUS

## 2020-04-20 MED ORDER — AMLODIPINE BESYLATE 10 MG PO TABS: 10.0000 mg | ORAL_TABLET | Freq: Every day | ORAL | Status: DC

## 2020-04-20 MED ORDER — CHLORHEXIDINE GLUCONATE 0.12% ORAL RINSE (MEDLINE KIT): 15.0000 mL | Freq: Two times a day (BID) | OROMUCOSAL | Status: DC

## 2020-04-20 NOTE — Progress Notes (Signed)
STROKE TEAM PROGRESS NOTE   HISTORY OF PRESENT ILLNESS (per record) Justin Blackburn. is a 69 y.o. male with a past medical history significant for A. 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 He completed his Pfizer vaccinations on 9/29 and 10/20, but became symptomatic with Covid on 10/28.  He was admitted on 10/31 for hypoxia.  Overall his course was felt to have been an uncomplicated one though his D-dimer was rising and there was plan for lower extremity duplex to evaluate for DVT today. He was last seen well by nursing at 2 PM, and on PT evaluation at 4:15 PM he was found to not be moving the left side well for which code stroke was activated.  As he last received Eliquis in the morning he was not a TPA candidate but head CT revealed no clear acute process and CTA revealed a P2 cut off for which he was taken to thrombectomy LKW: 2 PM, time of discovery 4:15 PM tPA given?: No, due to Eliquis IA performed?: Yes Premorbid modified rankin scale:      0 - No symptoms.   INTERVAL HISTORY His RN is at bedside. Intubated and sedated.     OBJECTIVE Vitals:   04/20/20 0800 04/20/20 0809 04/20/20 0900 04/20/20 1140  BP: 128/62 (!) 130/57 128/62 (!) 140/55  Pulse: 74 74 77 78  Resp: (!) 24 (!) 23 (!) 26 (!) 26  Temp:      TempSrc:      SpO2: 99% 99% 93% 95%  Weight:      Height:        CBC:  Recent Labs  Lab 04/18/20 0233 04/18/20 0233 04/27/2020 0024 05/11/2020 0024 04/20/2020 2151 04/20/20 0434  WBC 14.9*   < > 14.2*  --   --  18.6*  NEUTROABS 13.7*  --  12.9*  --   --   --   HGB 12.6*   < > 13.7   < > 10.9* 10.0*  HCT 38.3*   < > 42.8   < > 32.0* 32.9*  MCV 80.5   < > 80.8  --   --  85.7   PLT 380   < > 397  --   --  363   < > = values in this interval not displayed.    Basic Metabolic Panel:  Recent Labs  Lab 04/15/20 0450 04/16/20 0257 04/18/20 0233 04/18/20 0233 04/27/2020 0024 04/21/2020 0024 04/18/2020 2151 04/20/20 0434  NA 143   < > 138   < > 141   < > 142 142  K 4.3   < > 3.8   < > 3.8   < > 3.8 4.5  CL 109   < > 105   < > 105  --   --  112*  CO2 23   < > 22   < > 23  --   --  17*  GLUCOSE 225*   < > 168*   < > 149*  --   --  250*  BUN 45*   < > 75*   < > 78*  --   --  70*  CREATININE 2.93*   < > 2.79*   < > 2.61*  --   --  2.44*  CALCIUM 8.6*   < >  8.7*   < > 8.9  --   --  6.8*  MG 2.0   < > 1.9  --  2.0  --   --   --   PHOS 2.8  --   --   --   --   --   --   --    < > = values in this interval not displayed.    Lipid Panel:     Component Value Date/Time   CHOL 94 04/20/2020 0434   CHOL 129 09/21/2017 1352   TRIG 128 04/20/2020 0434   TRIG 126 04/20/2020 0434   HDL 25 (L) 04/20/2020 0434   HDL 31 (L) 09/21/2017 1352   CHOLHDL 3.8 04/20/2020 0434   VLDL 26 04/20/2020 0434   LDLCALC 43 04/20/2020 0434   LDLCALC 70 09/21/2017 1352   HgbA1c:  Lab Results  Component Value Date   HGBA1C 7.4 (H) 04/20/2020   Urine Drug Screen: No results found for: LABOPIA, COCAINSCRNUR, LABBENZ, AMPHETMU, THCU, LABBARB  Alcohol Level No results found for: [facility]  IMAGING  CT Code Stroke CTA Head W/WO contrast CT Code Stroke CTA Neck W/WO contrast 05/11/2020 IMPRESSION:  1. Occlusion right posterior cerebral artery which appears acute.  2. Moderate stenosis distal right vertebral artery and severe stenosis distal left vertebral artery. Moderate stenosis proximal right vertebral artery origin and mild stenosis origin of left vertebral artery  3. No significant carotid stenosis in the neck. Mild cavernous carotid stenosis on the right and moderate left cavernous carotid stenosis  4. 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  5. Both middle  cerebral arteries are patent without significant stenosis.   MR ANGIO HEAD WO CONTRAST 04/20/2020 IMPRESSION:   MRI HEAD  IMPRESSION:  1. 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 Associated petechial hemorrhage without hemorrhagic transformation or significant mass effect.  2. Underlying age-related cerebral atrophy with moderate chronic small vessel ischemic disease.   MRA HEAD  IMPRESSION:  1. Interval correction/resolution of previously seen proximal right PCA occlusion. Severe atheromatous disease seen throughout the underlying right P2 and distal right PCA branches.  2. Extensive atheromatous disease involving the anterior cerebral arteries bilaterally, right worse than left, with severe multifocal right A2 and A3 stenoses.  3. Moderate approximate 50% stenosis involving the cavernous left ICA.  4. Moderate to severe bilateral V4 stenoses as above.   DG Chest Port 1V same Day 04/30/2020 IMPRESSION:  1. Endotracheal tube tip at the level of the clavicular heads. Enteric tube with tip below the diaphragm in the stomach, side-port tentatively visualized in the lower esophagus. Advancement of 5 cm would place the side-port below the diaphragm.  2. Low lung volumes with patchy bibasilar predominant airspace disease, pneumonia versus edema.   DG Abd Portable 1V 04/20/2020  IMPRESSION:  NG tube in the stomach.   DG Abd Portable 1V 04/23/2020 IMPRESSION: Tip of the enteric tube below the diaphragm in the stomach, side-port in the region of the distal esophagus. Recommend advancement of least 7 cm for optimal placement.   CT HEAD CODE STROKE WO CONTRAST 04/21/2020 IMPRESSION:  1. 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 No associated hemorrhage or mass effect. ASPECTS 9.  2. Otherwise stable compared to September.   Neuro Interventional Radiology - Cerebral Angiogram with Intervention - Dr Estanislado Pandy 05/11/2020 - 8:28 PM Complete  revascularization of RT PCA with x 1 pass with contact aspiration achieving a TICI 3 revascularization. S/P Balloon angioplasty  of severe stenosis of Lt VBJ with a 3.92mm x 15 mm OTW balloon achieving a 60 to 70  % patency.  Transthoracic Echocardiogram  00/00/2021 Pending  Bilateral Lower Extremity Venous Dopplers - pending  ECG - SR rate (See cardiology reading for complete details)  EEG - pending  PHYSICAL EXAM Blood pressure (!) 140/55, pulse 78, temperature (!) 96.3 F (35.7 C), temperature source Axillary, resp. rate (!) 26, height 6' (1.829 m), weight 89 kg, SpO2 95 %.  CV: RRR, no M/R/G, no peripheral edema Elderly male Intubated and sedated. Head is atraumatic,normocephalic, Does not open eyes to sternal run, non verbal. Conjugate gaze. Pupils sluggish and pin point. +corneals.+dolls eyes. +cough/gag. Does not blink to threat. Cannot assess facial symmetry or tongue movements due to ETT. Left hemiplegia. Moves right UE and LE to stim. Gait deferred.   ASSESSMENT/PLAN Justin Blackburn. is a 69 y.o. male with history of A. 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 Vaccinated but admitted with hypoxia from Covid on 10/31. On 03/19/20 he was found to be not moving his left side. Not a tPA candidate due to Eliquis. Taken for thrombectomy. IR - Complete revascularization of RT PCA achieving a TICI 3 revascularization. S/P Balloon angioplasty of severe stenosis of Lt VBJ 60 to 70 % patency.  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 No  associated hemorrhage or mass effect. ASPECTS 9. Otherwise stable compared to September  CT head - not ordered  MRI head - Acute ischemic right ACA and PCA territory infarcts as above. Associated petechial hemorrhage without hemorrhagic transformation or significant mass effect. Underlying age-related cerebral atrophy with moderate chronic small vessel ischemic disease.   MRA head -  Interval correction/resolution of previously seen proximal right PCA occlusion. Severe atheromatous disease seen throughout the underlying right P2 and distal right PCA branches. Extensive atheromatous disease involving the anterior cerebral arteries bilaterally, right worse than left, with severe multifocal right A2 and A3 stenoses. Moderate approximate 50% stenosis involving the cavernous left ICA. Moderate to severe bilateral V4 stenoses  CTA H&N - Occlusion right posterior cerebral artery which appears acute. Moderate stenosis distal right vertebral artery and severe stenosis distal left vertebral artery. Moderate stenosis proximal right vertebral artery origin and mild stenosis origin of left vertebral artery. 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  CT Perfusion - not ordered  Carotid Doppler - CTA neck ordered - carotid dopplers not indicated.  2D Echo - pending  Lacey Jensen Virus 2 - positive (6 days ago)  Bilateral Lower Extremity Venous Dopplers - pending  EEG - pending  LDL - 43  HgbA1c - 7.4  UDS - not ordered  VTE prophylaxis - SCDs Diet  Diet Order            Diet NPO time specified  Diet effective now                 Eliquis (apixaban) daily prior to admission, now on aspirin 325 mg daily  Patient counseled to be compliant with his antithrombotic medications  Ongoing aggressive stroke risk factor management  Therapy recommendations:  pending  Disposition:  Pending  Hypertension  Home BP meds: Cozaar  Current BP meds:  Norvasc  Stable . Permissive hypertension (OK if < 220/120) but gradually normalize in 5-7 days  . Long-term BP goal normotensive  Hyperlipidemia  Home Lipid lowering medication: Crestor 40 mg daily  LDL 43, goal < 70  Current lipid lowering medication: Crestor 40 mg daily  Continue statin at discharge  Diabetes  Home diabetic meds: metformin  Current diabetic meds: insulin  HgbA1c 7.4, goal < 7.0 Recent Labs    04/20/20 0329 04/20/20 0841 04/20/20 1131  GLUCAP 259* 263* 232*    Other Stroke Risk Factors  Advanced age  Former cigarette smoker - quit  Coronary artery disease  Congestive Heart Failure  Atrial fibrillation  Other Active Problems  Code status - Full code  NPO - tube feeds Anemia - Hgb - 10.0  Leukocytosis - WBC's - 18.6  (afebrile) (steroids)  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  Hospital day # 6  This patient is critically ill and at significant risk of neurological worsening, death and care requires constant monitoring of vital signs, hemodynamics,respiratory and cardiac monitoring,review of multiple databases, neurological assessment, discussion with family, other specialists and medical decision making of high complexity.I  I spent 30 minutes of neurocritical care time in the care of this patient. Personally examined patient and images, and have participated in and made any corrections needed to history, physical, neuro exam,assessment and plan as stated above.  I have personally obtained the history, evaluated lab date, reviewed imaging studies and agree with radiology interpretations.    Sarina Ill, MD Stroke Neurology   To contact Stroke Continuity provider, please refer to [email or web address] After hours, contact General Neurology

## 2020-04-20 NOTE — Progress Notes (Signed)
PIV consult. Pt has had multiple PIV attempts. Limited vascular access options. Please consider central line.

## 2020-04-20 NOTE — Progress Notes (Signed)
EEG complete - results pending 

## 2020-04-20 NOTE — Procedures (Signed)
Patient Name: Justin Blackburn.  MRN: [id number]  Epilepsy Attending: Lora Havens  Referring Physician/Provider: Dr Kerney Elbe Date: 04/20/2020 Duration: 26.13 mins  Patient history: 69 year old malewithacute right PCA stroke s/p complete revascularization of RT PCA achieving a TICI 3 revascularization,S/P Balloon angioplasty of severe stenosis of Lt VBJ with a 3.53mm x 15 mm OTW balloon achieving a 60-70% patency. EEG to evaluate for seizure.  Level of alertness: Lethargic  AEDs during EEG study: None  Technical aspects: This EEG study was done with scalp electrodes positioned according to the 10-20 International system of electrode placement. Electrical activity was acquired at a sampling rate of 500Hz  and reviewed with a high frequency filter of 70Hz  and a low frequency filter of 1Hz . EEG data were recorded continuously and digitally stored.   Description: No posterior dominant rhythm was seen. EEG showed continuous generalized polymorphic 3 to 6 Hz theta-delta slowing, at times with triphasic morphology. Hyperventilation and photic stimulation were not performed.     ABNORMALITY -Continuous slow, generalized  IMPRESSION: This study is suggestive of moderate to severe diffuse encephalopathy, nonspecific etiology. No seizures or epileptiform discharges were seen throughout the recording.   [name] Barbra Sarks

## 2020-04-20 NOTE — Progress Notes (Signed)
VASCULAR LAB    Bilateral lower extremity venous duplex has been performed.  See CV proc for preliminary results.   Graceson Nichelson, RVT 04/20/2020, 2:36 PM

## 2020-04-20 NOTE — Progress Notes (Signed)
Pt transported from 4N to MRI and back w/out complications

## 2020-04-20 NOTE — Progress Notes (Signed)
Assisted tele visit to patient with wife.  Dianelys Scinto R, RN  

## 2020-04-20 NOTE — Progress Notes (Signed)
NAME:  Justin Grzelak., MRN:  [id number], DOB:  08/07/1950, LOS: 6 ADMISSION DATE:  03/24/2020, CONSULTATION DATE:  11/5 REFERRING MD:  Sloan Leiter, CHIEF COMPLAINT:  Vent mgmt    Brief History   69yo male with hx AFib on Eliquis, CHF, DM, CAD (CABG 2007), HTN, esophageal cancer initially admitted 10/31 with COVID PNA and respiratory failure.  On 11/5 he was noted to have L sided weakness and w/u revealed R PCA occlusion and he was taken for IR revascularization.    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   Significant Hospital Events     Consults:  Neuro PCCM  Procedures:  IR revascularization R PCA occlusion 11/5 ETT 11/5>>>  Significant Diagnostic Tests:  CT head/ CTA head/neck 11/5>>> 1. Occlusion right posterior cerebral artery which appears acute.  2. Moderate stenosis distal right vertebral artery and severe stenosis distal left vertebral artery. Moderate stenosis proximal right vertebral artery origin and mild stenosis origin of left vertebral artery  3. No significant carotid stenosis in the neck. Mild cavernous carotid stenosis on the right and moderate left cavernous carotid Stenosis  4. 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 5. Both middle cerebral arteries are patent without significant Stenosis.  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 Interval correction/resolution of previously seen proximal right PCA  occlusion. Severe atheromatous disease seen throughout the underlying right P2 and distal right PCA branches. 2. Extensive atheromatous disease involving the anterior cerebral arteries bilaterally, right worse than left, with severe multifocal right A2 and A3 stenoses. 3. Moderate approximate 50% stenosis involving the cavernous left ICA. 4. Moderate to severe bilateral V4 stenoses as above.   Micro Data:  BC x2 10/31>>> neg  Urine 10/31>>> insig growth  COVID 10/31>>> POS   Antimicrobials:    Interim history/subjective:  Sedation was turned off patient started waking up  Objective   Blood pressure 128/62, pulse 77, temperature (!) 96.3 F (35.7 C), temperature source Axillary, resp. rate (!) 26, height 6' (1.829 m), weight 89 kg, SpO2 93 %.    Vent Mode: PRVC FiO2 (%):  [80 %-100 %] 80 % Set Rate:  [15 bmp] 15 bmp Vt Set:  [560 mL] 560 mL PEEP:  [8 cmH20] 8 cmH20 Plateau Pressure:  [23 cmH20-27 cmH20] 27 cmH20   Intake/Output Summary (Last 24 hours) at 04/20/2020 1056 Last data filed at 04/20/2020 0700 Gross per 24 hour  Intake 1616.41 ml  Output 160 ml  Net 1456.41 ml   Filed Weights   03/17/2020 2102 04/23/2020 2115  Weight: 94.3 kg 89 kg    Examination: General: chronically ill appearing male, orally intubated HENT: Atraumatic, normocephalic, moist mucous membrane.  ETT and OGT in place Lungs: Coarse breath sounds, decreased air entry at the bases, no wheezes or rhonchi Cardiovascular: Regular rate and rhythm, no murmur or gallop Abdomen: soft, hypoactive bs  Extremities: warm and dry, no edema  Neuro: Opens eyes with vocal stimuli, trying to track examiner.  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 fib Holding eliquis for now  Continue amiodarone   HTN  Goal SBP 120-140 Currently on Cleviprex infusion, started on amlodipine  Poorly controlled DM with hyperglycemia Increase Lantus to 15 units twice daily Continue sliding scale and tube feed coverage  CKD stage IV Patient serum creatinine is at baseline, continue to monitor  Best practice:  Diet: NPO, tube feeds Pain/Anxiety/Delirium protocol (if indicated): As needed fentanyl VAP protocol (if indicated): ordered DVT prophylaxis: SCD's GI prophylaxis: PPI Glucose control: SSI/basal Mobility: BR Code Status: full Family Communication: Per primary team Disposition: ICU  Labs   CBC: Recent Labs  Lab 04/15/20 0450 04/15/20 0450 04/16/20 0257 04/16/20 0257 04/17/20 0021 04/18/20 0233 04/30/2020 0024 04/23/2020 2151 04/20/20 0434  WBC 7.2   < > 8.0  --  11.4* 14.9* 14.2*  --  18.6*  NEUTROABS 6.0  --  6.8  --  10.3* 13.7* 12.9*  --   --   HGB 12.4*   < > 11.7*   < > 12.1* 12.6* 13.7 10.9* 10.0*  HCT 40.2   < > 36.8*   < > 37.5* 38.3* 42.8 32.0* 32.9*  MCV 83.6   < > 80.9  --  82.1 80.5 80.8  --  85.7  PLT 255   < > 288  --  372 380 397  --  363   < > = values in this interval not displayed.    Basic Metabolic Panel: Recent Labs  Lab 04/15/20 0450 04/15/20 0450 04/16/20 0257 04/16/20 0257 04/17/20 0021 04/18/20 0233 04/30/2020 0024 04/20/2020 2151 04/20/20 0434  NA 143   < > 139   < > 137 138 141  142 142  K 4.3   < > 3.9   < > 4.3 3.8 3.8 3.8 4.5  CL 109   < > 109  --  104 105 105  --  112*  CO2 23   < > 19*  --  21* 22 23  --  17*  GLUCOSE 225*   < > 169*  --  181* 168* 149*  --  250*  BUN 45*   < > 54*  --  64* 75* 78*  --  70*  CREATININE 2.93*   < > 2.30*  --  2.42* 2.79* 2.61*  --  2.44*  CALCIUM 8.6*   < > 8.5*  --  8.8* 8.7* 8.9  --  6.8*  MG 2.0  --  2.0  --  2.0 1.9 2.0  --   --   PHOS 2.8  --   --   --   --   --   --   --   --    < > = values in this interval not displayed.   GFR: Estimated Creatinine Clearance: 30.5 mL/min (A) (by C-G formula based on SCr of 2.44  mg/dL (H)). Recent Labs  Lab 03/21/2020 2100 03/17/2020 2119 04/15/20 0030 04/15/20 0450 04/16/20 0257 04/17/20 0021 04/18/20 0233 04/22/2020 0024 04/20/20 0434  PROCALCITON  --   --   --  <0.10  --   --   --   --   --   WBC   < >  --   --  7.2   < > 11.4* 14.9* 14.2* 18.6*  LATICACIDVEN  --  2.0* 1.5  --   --   --   --   --   --    < > = values in this interval not displayed.    Liver Function Tests: Recent Labs  Lab 04/16/20 0257 04/17/20 0021 04/18/20 0233 04/15/2020 0024 04/20/20 0434  AST 25 26 20 20 21   ALT 14 15 15 17 14   ALKPHOS 65 72 82 93 66  BILITOT 0.6 0.5 0.5 0.7 0.7  PROT 5.7* 5.9* 5.9* 6.4* 4.6*  ALBUMIN 2.5* 2.4* 2.5* 2.7* 2.0*   No results for input(s): LIPASE, AMYLASE in the last 168 hours. No results for input(s): AMMONIA in the last 168 hours.  ABG    Component Value Date/Time   PHART 7.283 (L) 04/20/2020 0958   PCO2ART 42.7 04/20/2020 0958   PO2ART 90.7 04/20/2020 0958   HCO3 19.6 (L) 04/20/2020 0958   TCO2 24 04/16/2020 2151   ACIDBASEDEF 6.0 (H) 04/20/2020 0958   O2SAT 96.0 04/20/2020 0958     Coagulation Profile: Recent Labs  Lab 03/20/2020 2119  INR 1.4*    Cardiac Enzymes: No results for input(s): CKTOTAL, CKMB, CKMBINDEX, TROPONINI in the last 168 hours.  HbA1C: Hgb A1c MFr Bld  Date/Time Value Ref Range Status  04/20/2020 04:34 AM 7.4 (H) 4.8 - 5.6  % Final    Comment:    (NOTE) Pre diabetes:          5.7%-6.4%  Diabetes:              >6.4%  Glycemic control for   <7.0% adults with diabetes   04/15/2020 04:50 AM 7.0 (H) 4.8 - 5.6 % Final    Comment:    (NOTE) Pre diabetes:          5.7%-6.4%  Diabetes:              >6.4%  Glycemic control for   <7.0% adults with diabetes     CBG: Recent Labs  Lab 05/09/2020 1227 04/20/2020 2101 04/20/20 0008 04/20/20 0329 04/20/20 0841  GLUCAP 129* 111* 159* 259* 263*    Review of Systems:   Unable to obtain as patient is intubated and sedated  Past Medical History  He,  has a past medical history of A-fib ([place]), Acute kidney injury ([place]), Anemia, Angina, Arthritis, Atrial flutter, paroxysmal ([place]), Cancer ([place]), CHF (congestive heart failure) ([place]), Chronic lower back pain, Coronary artery disease, DVT (deep venous thrombosis) (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   Surgical History    Past Surgical History:  Procedure Laterality Date  . CARDIOVERSION N/A 01/29/2015   Procedure: CARDIOVERSION;  Surgeon: [name], MD;  Location: [facility];  Service: Cardiovascular;  Laterality: N/A;  . CARDIOVERSION N/A 04/26/2018   Procedure: CARDIOVERSION;  Surgeon: Josue Hector, MD;  Location: [place] ;  Service: Cardiovascular;  Laterality: N/A;  . CORONARY ANGIOPLASTY WITH STENT PLACEMENT  08/31/11   "  2 today; makes total of 3 stents"  . CORONARY ANGIOPLASTY WITH STENT PLACEMENT  08/10/2017   "2 today; makes a total of 5 stents" (08/10/2017)  . CORONARY ARTERY BYPASS GRAFT  1998; 2007   CABG X 4; CABG X4  . CORONARY STENT INTERVENTION N/A 08/10/2017   Procedure: CORONARY STENT INTERVENTION;  Surgeon: Sherren Mocha, MD;  Location: [facility]  [facility];  Service: Cardiovascular;  Laterality: N/A;  . ESOPHAGOGASTRODUODENOSCOPY (EGD) WITH PROPOFOL N/A 09/01/2017   Procedure: ESOPHAGOGASTRODUODENOSCOPY (EGD) WITH PROPOFOL;  Surgeon: Laurence Spates, MD;  Location: [facility];  Service: Endoscopy;  Laterality: N/A;  . EUS N/A 09/29/2017   Procedure: UPPER ENDOSCOPIC ULTRASOUND (EUS) LINEAR;  Surgeon: Arta Silence, MD;  Location: [facility];  Service: Endoscopy;  Laterality: N/A;  . LEFT HEART CATH AND CORS/GRAFTS ANGIOGRAPHY N/A 08/10/2017   Procedure: LEFT HEART CATH AND CORS/GRAFTS ANGIOGRAPHY;  Surgeon: Sherren Mocha, MD;  Location: [facility];  Service: Cardiovascular;  Laterality: N/A;  . LEFT HEART CATHETERIZATION WITH CORONARY/GRAFT ANGIOGRAM N/A 08/31/2011   Procedure: LEFT HEART CATHETERIZATION WITH Beatrix Fetters;  Surgeon: [name], MD;  Location[facility] C[facility];  Service: Cardiovascular;  Laterality: N/A;  . PERIPHERAL VASCULAR CATHETERIZATION N/A 04/27/2016   Procedure: Abdominal Aortogram w/Lower Extremity;  Surgeon: Angelia Mould, MD;  Location: [place] [facility];  Service: Cardiovascular;  Laterality: N/A;  . TEE WITHOUT CARDIOVERSION N/A 01/29/2015   Procedure: TRANSESOPHAGEAL ECHOCARDIOGRAM (TEE);  Surgeon: [name], MD;  Location[facility] ENDOSCOPY;  Service: Cardiovascular;  Laterality: N/A;     Social History   reports that he quit smoking about 48 years ago. His smoking use included cigarettes. He has a 12.00 pack-year smoking history. He has never used smokeless tobacco. He reports that he does not drink alcohol and does not use drugs.   Family History   His family history includes Diabetes in his father and mother; Heart attack in his brother, father, and mother; Heart disease in his father and mother. There is no history of Stroke.   Allergies No Known Allergies   Home Medications  Prior to Admission medications   Medication Sig Start Date End Date Taking? Authorizing  Provider  amiodarone (PACERONE) 200 MG tablet TAKE 1 TABLET BY MOUTH EVERY DAY Patient taking differently: Take 200 mg by mouth daily.  03/21/20  Yes Belva Crome, MD  ELIQUIS 5 MG TABS tablet TAKE 1 TABLET BY MOUTH TWICE A DAY 05/09/2020   Belva Crome, MD  gabapentin (NEURONTIN) 300 MG capsule Take 300 mg by mouth 2 (two) times daily.    Yes [provider]  insulin degludec (TRESIBA FLEXTOUCH) 100 UNIT/ML FlexTouch Pen Inject 30 Units into the skin daily.    Yes [provider]  losartan (COZAAR) 50 MG tablet Take 1 tablet (50 mg total) by mouth daily. 03/14/20  Yes Belva Crome, MD  metFORMIN (GLUCOPHAGE) 1000 MG tablet Take 1,000 mg by mouth 2 (two) times daily with a meal. 05/19/18  Yes [provider]  nitroGLYCERIN (NITROSTAT) 0.4 MG SL tablet PLACE 1 TABLET UNDER THE TONGUE EVERY 5 (FIVE) MINUTES AS NEEDED FOR CHEST PAIN (ANGINA). Patient taking differently: Place 0.4 mg under the tongue every 5 (five) minutes as needed for chest pain.  01/23/20  Yes Belva Crome, MD  pantoprazole (PROTONIX) 40 MG tablet TAKE 1 TABLET BY MOUTH EVERY DAY Patient taking differently: Take 40 mg by mouth daily.  10/23/19  Yes Belva Crome, MD  rosuvastatin (  CRESTOR) 40 MG tablet Take 1 tablet (40 mg total) by mouth daily. 04/20/19 05/14/20 Yes Belva Crome, MD    Total critical care time: 43 minutes  Performed by: [place] care time was exclusive of separately billable procedures and treating other patients.   Critical care was necessary to treat or prevent imminent or life-threatening deterioration.   Critical care was time spent personally by me on the following activities: development of treatment plan with patient and/or surrogate as well as nursing, discussions with consultants, evaluation of patient's response to treatment, examination of patient, obtaining history from patient or surrogate, ordering and performing treatments and interventions, ordering  and review of laboratory studies, ordering and review of radiographic studies, pulse oximetry and re-evaluation of patient's condition.   Jacky Kindle MD Critical care physician [facility]  Pager: [phone] Mobile: [phone]

## 2020-04-20 NOTE — Progress Notes (Signed)
MRI reveals acute ischemic right ACA and PCA territory infarcts. Associated petechial hemorrhage without hemorrhagic transformation or significant mass effect. No brainstem infarction is seen to explain his post-procedure unresponsiveness.  MRA head reveals interval correction/resolution of previously seen proximal right PCA occlusion. Severe atheromatous disease seen throughout the underlying right P2 and distal right PCA branches. Extensive atheromatous disease involving the anterior cerebral arteries bilaterally, right worse than left, with severe multifocal right A2 and A3 stenoses.  Moderate approximate 50% stenosis involving the cavernous left ICA. Moderate to severe bilateral V4 stenoses as above.  BP haas been fluctuating, requiring frequent adjustments to his clevidipine gtt.   A/R: 69 year old male with acute right PCA stroke, underlying etiology atheroembolic vs. cardioembolic vs. hypercoagulable (given elevated D-dimer in the setting of COVID19 infection). He is s/p complete revascularization of RT PCA achieving a TICI 3 revascularization, S/P Balloon angioplasty of severe stenosis of Lt VBJ with a 3.43mm x 15 mm OTW balloon achieving a 60 - 70% patency. -- Post-procedure CT brain x 2 in VIR with no ICH, mass effect.or hydrocephalus. -- Post-procedure exam 30 minutes after sedation held was worrisome for possible brainstem stroke. However, this has been ruled out with MRI. He continues to be unresponsive. The most likely etiology is now felt to be delayed clearance of anesthetic/sedative/paralytic used during the procedure given significant impairment of renal function. Subclinical seizure activity is felt to be unlikely but is on the DDx. An EEG has been ordered.  -- CCM to assess his decreased urine output and renal function. May need Nephrology consult  Electronically signed: Dr. Kerney Elbe

## 2020-04-20 NOTE — Plan of Care (Signed)
  Problem: Clinical Measurements: Goal: Will remain free from infection Outcome: Progressing Goal: Cardiovascular complication will be avoided Outcome: Progressing   Problem: Elimination: Goal: Will not experience complications related to bowel motility Outcome: Progressing   Problem: Safety: Goal: Ability to remain free from injury will improve Outcome: Progressing   Problem: Skin Integrity: Goal: Risk for impaired skin integrity will decrease Outcome: Progressing   Problem: Coping: Goal: Psychosocial and spiritual needs will be supported Outcome: Progressing   Problem: Respiratory: Goal: Will maintain a patent airway Outcome: Progressing Goal: Complications related to the disease process, condition or treatment will be avoided or minimized Outcome: Progressing   Problem: Nutrition: Goal: Risk of aspiration will decrease Outcome: Progressing   Problem: Ischemic Stroke/TIA Tissue Perfusion: Goal: Complications of ischemic stroke/TIA will be minimized Outcome: Progressing   Problem: Ischemic Stroke/TIA Tissue Perfusion: Goal: Complications of ischemic stroke/TIA will be minimized Outcome: Progressing

## 2020-04-20 NOTE — Progress Notes (Signed)
  Echocardiogram 2D Echocardiogram has been performed.  Justin Blackburn 04/20/2020, 3:43 PM

## 2020-04-21 ENCOUNTER — Encounter (HOSPITAL_COMMUNITY): Payer: Self-pay | Admitting: Radiology

## 2020-04-21 DIAGNOSIS — I639 Cerebral infarction, unspecified: Secondary | ICD-10-CM | POA: Diagnosis not present

## 2020-04-21 LAB — POCT I-STAT 7, (LYTES, BLD GAS, ICA,H+H)
Acid-base deficit: 4 mmol/L — ABNORMAL HIGH (ref 0.0–2.0)
Acid-base deficit: 4 mmol/L — ABNORMAL HIGH (ref 0.0–2.0)
Bicarbonate: 20 mmol/L (ref 20.0–28.0)
Bicarbonate: 21.7 mmol/L (ref 20.0–28.0)
Calcium, Ion: 1.15 mmol/L (ref 1.15–1.40)
Calcium, Ion: 1.15 mmol/L (ref 1.15–1.40)
HCT: 34 % — ABNORMAL LOW (ref 39.0–52.0)
HCT: 35 % — ABNORMAL LOW (ref 39.0–52.0)
Hemoglobin: 11.6 g/dL — ABNORMAL LOW (ref 13.0–17.0)
Hemoglobin: 11.9 g/dL — ABNORMAL LOW (ref 13.0–17.0)
O2 Saturation: 93 %
O2 Saturation: 97 %
Patient temperature: 97.9
Patient temperature: 98.9
Potassium: 4.4 mmol/L (ref 3.5–5.1)
Potassium: 4.7 mmol/L (ref 3.5–5.1)
Sodium: 143 mmol/L (ref 135–145)
Sodium: 143 mmol/L (ref 135–145)
TCO2: 21 mmol/L — ABNORMAL LOW (ref 22–32)
TCO2: 23 mmol/L (ref 22–32)
pCO2 arterial: 31.8 mmHg — ABNORMAL LOW (ref 32.0–48.0)
pCO2 arterial: 40.8 mmHg (ref 32.0–48.0)
pH, Arterial: 7.405 (ref 7.350–7.450)
pH, Arterial: 7.335 — ABNORMAL LOW (ref 7.350–7.450)
pO2, Arterial: 65 mmHg — ABNORMAL LOW (ref 83.0–108.0)
pO2, Arterial: 98 mmHg (ref 83.0–108.0)

## 2020-04-21 LAB — COMPREHENSIVE METABOLIC PANEL
ALT: 11 U/L (ref 0–44)
AST: 20 U/L (ref 15–41)
Albumin: 2.4 g/dL — ABNORMAL LOW (ref 3.5–5.0)
Alkaline Phosphatase: 77 U/L (ref 38–126)
Anion gap: 13 (ref 5–15)
BUN: 110 mg/dL — ABNORMAL HIGH (ref 8–23)
CO2: 19 mmol/L — ABNORMAL LOW (ref 22–32)
Calcium: 8.2 mg/dL — ABNORMAL LOW (ref 8.9–10.3)
Chloride: 110 mmol/L (ref 98–111)
Creatinine, Ser: 4.62 mg/dL — ABNORMAL HIGH (ref 0.61–1.24)
GFR, Estimated: 13 mL/min — ABNORMAL LOW (ref >60)
Glucose, Bld: 206 mg/dL — ABNORMAL HIGH (ref 70–99)
Potassium: 4.3 mmol/L (ref 3.5–5.1)
Sodium: 142 mmol/L (ref 135–145)
Total Bilirubin: 0.6 mg/dL (ref 0.3–1.2)
Total Protein: 5 g/dL — ABNORMAL LOW (ref 6.5–8.1)

## 2020-04-21 LAB — CBC
HCT: 36.3 % — ABNORMAL LOW (ref 39.0–52.0)
Hemoglobin: 11.8 g/dL — ABNORMAL LOW (ref 13.0–17.0)
MCH: 26.3 pg (ref 26.0–34.0)
MCHC: 32.5 g/dL (ref 30.0–36.0)
MCV: 80.8 fL (ref 80.0–100.0)
Platelets: 361 10*3/uL (ref 150–400)
RBC: 4.49 MIL/uL (ref 4.22–5.81)
RDW: 18.4 % — ABNORMAL HIGH (ref 11.5–15.5)
WBC: 19.7 10*3/uL — ABNORMAL HIGH (ref 4.0–10.5)
nRBC: 0.2 % (ref 0.0–0.2)

## 2020-04-21 LAB — GLUCOSE, CAPILLARY
Glucose-Capillary: 227 mg/dL — ABNORMAL HIGH (ref 70–99)
Glucose-Capillary: 192 mg/dL — ABNORMAL HIGH (ref 70–99)
Glucose-Capillary: 213 mg/dL — ABNORMAL HIGH (ref 70–99)
Glucose-Capillary: 221 mg/dL — ABNORMAL HIGH (ref 70–99)
Glucose-Capillary: 244 mg/dL — ABNORMAL HIGH (ref 70–99)
Glucose-Capillary: 222 mg/dL — ABNORMAL HIGH (ref 70–99)
Glucose-Capillary: 226 mg/dL — ABNORMAL HIGH (ref 70–99)

## 2020-04-21 MED ORDER — HYDROCHLOROTHIAZIDE 25 MG PO TABS
25.0000 mg | ORAL_TABLET | Freq: Every day | ORAL | Status: DC
  Administered 2020-04-21 – 2020-04-22 (×2): 25 mg
  Filled 2020-04-21 (×2): qty 1

## 2020-04-21 MED ORDER — INSULIN ASPART 100 UNIT/ML ~~LOC~~ SOLN
6.0000 [IU] | SUBCUTANEOUS | Status: DC
  Administered 2020-04-21 – 2020-04-23 (×10): 6 [IU] via SUBCUTANEOUS

## 2020-04-21 MED ORDER — FUROSEMIDE 10 MG/ML IJ SOLN
80.0000 mg | Freq: Once | INTRAMUSCULAR | Status: AC
  Administered 2020-04-21: 80 mg via INTRAVENOUS
  Filled 2020-04-21: qty 8

## 2020-04-21 MED ORDER — INSULIN GLARGINE 100 UNIT/ML ~~LOC~~ SOLN
20.0000 [IU] | Freq: Two times a day (BID) | SUBCUTANEOUS | Status: DC
  Administered 2020-04-21 – 2020-04-22 (×3): 20 [IU] via SUBCUTANEOUS
  Filled 2020-04-21 (×4): qty 0.2

## 2020-04-21 NOTE — Progress Notes (Signed)
NAME:  Justin Blackburn., MRN:  [id number], DOB:  07-17-1950, LOS: 7 ADMISSION DATE:  04/07/2020, CONSULTATION DATE:  11/5 REFERRING MD:  Sloan Leiter, CHIEF COMPLAINT:  Vent mgmt    Brief History   69yo male with hx AFib on Eliquis, CHF, DM, CAD (CABG 2007), HTN, esophageal cancer initially admitted 10/31 with COVID PNA and respiratory failure.  On 11/5 he was noted to have L sided weakness and w/u revealed R PCA occlusion and he was taken for IR revascularization.    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   Significant Hospital Events     Consults:  Neuro PCCM  Procedures:  IR revascularization R PCA occlusion 11/5 ETT 11/5>>>  Significant Diagnostic Tests:  CT head/ CTA head/neck 11/5>>> 1. Occlusion right posterior cerebral artery which appears acute.  2. Moderate stenosis distal right vertebral artery and severe stenosis distal left vertebral artery. Moderate stenosis proximal right vertebral artery origin and mild stenosis origin of left vertebral artery  3. No significant carotid stenosis in the neck. Mild cavernous carotid stenosis on the right and moderate left cavernous carotid Stenosis  4. 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 5. Both middle cerebral arteries are patent without significant Stenosis.  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 Interval correction/resolution of previously seen proximal right PCA  occlusion. Severe atheromatous disease seen throughout the underlying right P2 and distal right PCA branches. 2. Extensive atheromatous disease involving the anterior cerebral arteries bilaterally, right worse than left, with severe multifocal right A2 and A3 stenoses. 3. Moderate approximate 50% stenosis involving the cavernous left ICA. 4. Moderate to severe bilateral V4 stenoses as above.   Micro Data:  BC x2 10/31>>> neg  Urine 10/31>>> insig growth  COVID 10/31>>> POS   Antimicrobials:    Interim history/subjective:  Patient was placed on pressure support trial this morning, he has been tolerating well, ABG was done after 1 hour of pressure support trial which shows PaO2 of 66 on FiO2 40%.  He is off sedation. Patient serum creatinine is trending up and he is getting oliguric  Objective   Blood pressure (!) 169/80, pulse 89, temperature 97.9 F (36.6 C), temperature source Axillary, resp. rate (!) 27, height 6' (1.829 m), weight 89 kg, SpO2 92 %.    Vent Mode: PSV;CPAP FiO2 (%):  [40 %-50 %] 40 % Set Rate:  [20 bmp] 20 bmp Vt Set:  [560 mL-620 mL] 620 mL PEEP:  [8 cmH20] 8 cmH20 Pressure Support:  [5 cmH20] 5 cmH20 Plateau Pressure:  [15 cmH20-33 cmH20] 33 cmH20   Intake/Output Summary (Last 24 hours) at 04/21/2020 1319 Last data filed at 04/21/2020 1300 Gross per 24 hour  Intake 1160.74 ml  Output 165 ml  Net 995.74 ml   Filed Weights   04/01/2020 2102 05/05/2020 2115  Weight: 94.3 kg 89 kg    Examination: General: Chronically ill appearing male, orally intubated HENT: Atraumatic, normocephalic, moist mucous membrane.  ETT and OGT in place Lungs: Fine crackles at bases bilaterally, no wheezes or rhonchi Cardiovascular: Regular rate and rhythm, no  murmur or gallop Abdomen: soft, bowel sounds present Extremities: warm and dry, no edema  Neuro: Opens eyes with vocal stimuli, following simple command on right side, plegic left side arm > leg Skin: Warm to touch, no  rash  Resolved Hospital Problem list     Assessment & Plan:  Acute hypoxic/hypercapnic respiratory failure - multifactorial in setting covid pna now with stroke  COVID PNA Continue lung protective mechanical ventilation Target TVol 6 cc/kgIBW Target Plateau Pressure < 30cm H20 and  driving pressure < 15 cm of water has been achieved Patient has been tolerating pressure support trial very well, will switch him back to full mechanical ventilatory support as his PaO2 is only 66 on 40% FiO2 We will try to place him on pressure support trial again in the morning and see if we can extubate him Target PaO2 55-80: titrate PEEP/FiO2 per protocol Continue Solu-Medrol  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 Fib Patient's heart rate is well controlled Holding eliquis for now  Continue amiodarone   HTN  Goal SBP 120-160 Currently on Cleviprex infusion Continue amlodipine and started on HCTZ  Poorly controlled DM with hyperglycemia Increase Lantus to 25 units twice daily Continue sliding scale and tube feed coverage and sliding scale insulin  AKI on CKD stage IV Patient serum creatinine continue to trend up We will give him trial of Lasix 120 mg x 1 We will ask nephrology consult  Best practice:  Diet: NPO, tube feeds Pain/Anxiety/Delirium protocol (if indicated): As needed fentanyl VAP protocol (if indicated): Yes DVT prophylaxis: SCD's GI prophylaxis: PPI Glucose control: SSI/basal Mobility: BR Code Status: full Family Communication: Patient's wife was updated over the phone Disposition: ICU  Labs   CBC: Recent Labs  Lab 04/15/20 0450 04/15/20 0450 04/16/20 0257 04/16/20 0257 04/17/20 0021  04/17/20 0021 04/18/20 0233 04/18/20 0233 05/12/2020 0024 05/07/2020 2151 04/20/20 0434 04/21/20 0555 04/21/20 1205  WBC 7.2   < > 8.0   < > 11.4*  --  14.9*  --  14.2*  --  18.6* 19.7*  --   NEUTROABS 6.0  --  6.8  --  10.3*  --  13.7*  --  12.9*  --   --   --   --   HGB 12.4*   < > 11.7*   < > 12.1*   < > 12.6*   < > 13.7 10.9* 10.0* 11.8* 11.6*  HCT 40.2   < > 36.8*   < > 37.5*   < > 38.3*   < > 42.8 32.0* 32.9* 36.3* 34.0*  MCV 83.6   < > 80.9   < > 82.1  --  80.5  --  80.8  --  85.7 80.8  --   PLT 255   < > 288   < > 372  --  380  --  397  --  363 361  --    < > = values in this interval not displayed.    Basic Metabolic Panel: Recent Labs  Lab 04/15/20 0450 04/15/20 0450 04/16/20 0257 04/16/20 0257 04/17/20 0021 04/17/20 0021 04/18/20 6160 04/18/20 0233 05/09/2020 0024 04/22/2020 0024 05/07/2020 2151 04/20/20 0434 04/20/20 1332 04/21/20 0555 04/21/20 1205  NA 143   < > 139   < >  137   < > 138   < > 141   < > 142 142 139 142 143  K 4.3   < > 3.9   < > 4.3   < > 3.8   < > 3.8   < > 3.8 4.5 4.9 4.3 4.4  CL 109   < > 109   < > 104   < > 105  --  105  --   --  112* 108 110  --   CO2 23   < > 19*   < > 21*   < > 22  --  23  --   --  17* 19* 19*  --   GLUCOSE 225*   < > 169*   < > 181*   < > 168*  --  149*  --   --  250* 261* 206*  --   BUN 45*   < > 54*   < > 64*   < > 75*  --  78*  --   --  70* 87* 110*  --   CREATININE 2.93*   < > 2.30*   < > 2.42*   < > 2.79*  --  2.61*  --   --  2.44* 3.36* 4.62*  --   CALCIUM 8.6*   < > 8.5*   < > 8.8*   < > 8.7*  --  8.9  --   --  6.8* 7.7* 8.2*  --   MG 2.0  --  2.0  --  2.0  --  1.9  --  2.0  --   --   --   --   --   --   PHOS 2.8  --   --   --   --   --   --   --   --   --   --   --   --   --   --    < > = values in this interval not displayed.   GFR: Estimated Creatinine Clearance: 16.1 mL/min (A) (by C-G formula based on SCr of 4.62 mg/dL (H)). Recent Labs  Lab 04/11/2020 2100 04/02/2020 2119 04/15/20 0030 04/15/20 0450  04/16/20 0257 04/18/20 0233 05/12/2020 0024 04/20/20 0434 04/21/20 0555  PROCALCITON  --   --   --  <0.10  --   --   --   --   --   WBC   < >  --   --  7.2   < > 14.9* 14.2* 18.6* 19.7*  LATICACIDVEN  --  2.0* 1.5  --   --   --   --   --   --    < > = values in this interval not displayed.    Liver Function Tests: Recent Labs  Lab 04/17/20 0021 04/18/20 0233 05/02/2020 0024 04/20/20 0434 04/21/20 0555  AST 26 20 20 21 20   ALT 15 15 17 14 11   ALKPHOS 72 82 93 66 77  BILITOT 0.5 0.5 0.7 0.7 0.6  PROT 5.9* 5.9* 6.4* 4.6* 5.0*  ALBUMIN 2.4* 2.5* 2.7* 2.0* 2.4*   No results for input(s): LIPASE, AMYLASE in the last 168 hours. No results for input(s): AMMONIA in the last 168 hours.  ABG    Component Value Date/Time   PHART 7.405 04/21/2020 1205   PCO2ART 31.8 (L) 04/21/2020 1205   PO2ART 65 (L) 04/21/2020 1205   HCO3 20.0 04/21/2020 1205   TCO2 21 (L) 04/21/2020  1205   ACIDBASEDEF 4.0 (H) 04/21/2020 1205   O2SAT 93.0 04/21/2020 1205     Coagulation Profile: Recent Labs  Lab 03/31/2020 2119  INR 1.4*    Cardiac Enzymes: No results for input(s): CKTOTAL, CKMB, CKMBINDEX, TROPONINI in the last 168 hours.  HbA1C: Hgb A1c MFr Bld  Date/Time Value Ref Range Status  04/20/2020 04:34 AM 7.4 (H) 4.8 - 5.6 % Final    Comment:    (NOTE) Pre diabetes:          5.7%-6.4%  Diabetes:              >6.4%  Glycemic control for   <7.0% adults with diabetes   04/15/2020 04:50 AM 7.0 (H) 4.8 - 5.6 % Final    Comment:    (NOTE) Pre diabetes:          5.7%-6.4%  Diabetes:              >6.4%  Glycemic control for   <7.0% adults with diabetes     CBG: Recent Labs  Lab 04/20/20 2038 04/21/20 0141 04/21/20 0309 04/21/20 0848 04/21/20 1133  GLUCAP 252* 227* 192* 213* 221*    Review of Systems:   Unable to obtain as patient is intubated and sedated  Past Medical History  He,  has a past medical history of A-fib ([place]), Acute kidney injury ([place]), Anemia, Angina,  Arthritis, Atrial flutter, paroxysmal ([place]), Cancer ([place]), CHF (congestive heart failure) ([place]), Chronic lower back pain, Coronary artery disease, DVT (deep venous thrombosis) ([place]), Dyspnea, Erectile dysfunction, Heart failure ([place]), Hypercholesteremia, Hypertension, 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   Surgical History    Past Surgical History:  Procedure Laterality Date  . CARDIOVERSION N/A 01/29/2015   Procedure: CARDIOVERSION;  Surgeon: [name], MD;  Location: [facility];  Service: Cardiovascular;  Laterality: N/A;  . CARDIOVERSION N/A 04/26/2018   Procedure: CARDIOVERSION;  Surgeon: Josue Hector, MD;  Location: [facility] ENDOSCOPY;  Service: Cardiovascular;  Laterality: N/A;  . CORONARY ANGIOPLASTY WITH STENT PLACEMENT  08/31/11   "2 today; makes total of 3 stents"  . CORONARY ANGIOPLASTY WITH STENT PLACEMENT  08/10/2017   "2 today; makes a total of 5 stents" (08/10/2017)  . CORONARY ARTERY BYPASS GRAFT  1998; 2007   CABG X 4; CABG X4  . CORONARY STENT INTERVENTION N/A 08/10/2017   Procedure: CORONARY STENT INTERVENTION;  Surgeon: Sherren Mocha, MD;  Location: [place] CV LAB;  Service: Cardiovascular;  Laterality: N/A;  . ESOPHAGOGASTRODUODENOSCOPY (EGD) WITH PROPOFOL N/A 09/01/2017   Procedure: ESOPHAGOGASTRODUODENOSCOPY (EGD) WITH PROPOFOL;  Surgeon: Laurence Spates, MD;  Location: [facility];  Service: Endoscopy;  Laterality: N/A;  . EUS N/A 09/29/2017   Procedure: UPPER ENDOSCOPIC ULTRASOUND (EUS) LINEAR;  Surgeon: Arta Silence, MD;  Location: [facility] ENDOSCOPY;  Service: Endoscopy;  Laterality: N/A;  . LEFT HEART CATH AND CORS/GRAFTS ANGIOGRAPHY N/A 08/10/2017   Procedure: LEFT HEART CATH AND CORS/GRAFTS ANGIOGRAPHY;  Surgeon: Sherren Mocha, MD;  Location: [place] [facility];  Service: Cardiovascular;  Laterality: N/A;  . LEFT  HEART CATHETERIZATION WITH CORONARY/GRAFT ANGIOGRAM N/A 08/31/2011   Procedure: LEFT HEART CATHETERIZATION WITH Beatrix Fetters;  Surgeon: [name], MD;  Location[facility] CATH LAB;  Service: Cardiovascular;  Laterality: N/A;  . PERIPHERAL VASCULAR CATHETERIZATION N/A 04/27/2016   Procedure: Abdominal Aortogram w/Lower Extremity;  Surgeon: Angelia Mould, MD;  Location: [place] CV LAB;  Service: Cardiovascular;  Laterality: N/A;  . RADIOLOGY WITH ANESTHESIA N/A 05/11/2020   Procedure: IR WITH ANESTHESIA;  Surgeon: Radiologist, Medication, MD;  Location: [facility];  Service: Radiology;  Laterality: N/A;  . TEE WITHOUT CARDIOVERSION N/A 01/29/2015   Procedure: TRANSESOPHAGEAL ECHOCARDIOGRAM (TEE);  Surgeon: [name], MD;  Location: [facility] ENDOSCOPY;  Service: Cardiovascular;  Laterality: N/A;     Social History   reports that he quit smoking about 48 years ago. His smoking use included cigarettes. He has a 12.00 pack-year smoking history. He has never used smokeless tobacco. He reports that he does not drink alcohol and does not use drugs.   Family History   His family history includes Diabetes in his father and mother; Heart attack in his brother, father, and mother; Heart disease in his father and mother. There is no history of Stroke.   Allergies No Known Allergies   Home Medications  Prior to Admission medications   Medication Sig Start Date End Date Taking? Authorizing Provider  amiodarone (PACERONE) 200 MG tablet TAKE 1 TABLET BY MOUTH EVERY DAY Patient taking differently: Take 200 mg by mouth daily.  03/21/20  Yes Belva Crome, MD  ELIQUIS 5 MG TABS tablet TAKE 1 TABLET BY MOUTH TWICE A DAY 04/18/2020   Belva Crome, MD  gabapentin (NEURONTIN) 300 MG capsule Take 300 mg by mouth 2 (two) times daily.    Yes [provider]  insulin degludec (TRESIBA FLEXTOUCH) 100 UNIT/ML FlexTouch Pen Inject 30 Units into the skin daily.    Yes [provider]   losartan (COZAAR) 50 MG tablet Take 1 tablet (50 mg total) by mouth daily. 03/14/20  Yes Belva Crome, MD  metFORMIN (GLUCOPHAGE) 1000 MG tablet Take 1,000 mg by mouth 2 (two) times daily with a meal. 05/19/18  Yes [provider]  nitroGLYCERIN (NITROSTAT) 0.4 MG SL tablet PLACE 1 TABLET UNDER THE TONGUE EVERY 5 (FIVE) MINUTES AS NEEDED FOR CHEST PAIN (ANGINA). Patient taking differently: Place 0.4 mg under the tongue every 5 (five) minutes as needed for chest pain.  01/23/20  Yes Belva Crome, MD  pantoprazole (PROTONIX) 40 MG tablet TAKE 1 TABLET BY MOUTH EVERY DAY Patient taking differently: Take 40 mg by mouth daily.  10/23/19  Yes Belva Crome, MD  rosuvastatin (CRESTOR) 40 MG tablet Take 1 tablet (40 mg total) by mouth daily. 04/20/19 05/14/20 Yes Belva Crome, MD    Total critical care time: 38 minutes  Performed by: [place] care time was exclusive of separately billable procedures and treating other patients.   Critical care was necessary to treat or prevent imminent or life-threatening deterioration.   Critical care was time spent personally by me on the following activities: development of treatment plan with patient and/or surrogate as well as nursing, discussions with consultants, evaluation of patient's response to treatment, examination of patient, obtaining history from patient or surrogate, ordering and performing treatments and interventions, ordering and review of laboratory studies, ordering and review of radiographic studies, pulse oximetry and re-evaluation of patient's condition.   Jacky Kindle MD Critical care physician [facility]  Pager: [phone] Mobile: [phone]

## 2020-04-21 NOTE — Anesthesia Postprocedure Evaluation (Signed)
Anesthesia Post Note  Patient: Justin Blackburn.  Procedure(s) Performed: IR WITH ANESTHESIA (N/A )     Patient location during evaluation: ICU Anesthesia Type: General Level of consciousness: sedated and patient remains intubated per anesthesia plan Pain management: pain level controlled Vital Signs Assessment: post-procedure vital signs reviewed and stable Respiratory status: patient remains intubated per anesthesia plan and patient on ventilator - see flowsheet for VS Cardiovascular status: stable Postop Assessment: no apparent nausea or vomiting Anesthetic complications: no Comments: Pt remains critically ill, COVID +, s/p stroke   No complications documented.  Last Vitals:  Vitals:   04/21/20 1900 04/21/20 1958  BP: (!) 170/74 (!) 150/58  Pulse: 88 87  Resp: (!) 24 (!) 27  Temp:    SpO2: 94% 94%    Last Pain:  Vitals:   04/21/20 1600  TempSrc: Axillary  PainSc:                  Melanny Wire,E. [place] Marcucci

## 2020-04-21 NOTE — Progress Notes (Signed)
Assisted tele visit to patient with family member.  Cherye Gaertner R, RN  

## 2020-04-21 NOTE — Progress Notes (Signed)
STROKE TEAM PROGRESS NOTE   HISTORY OF PRESENT ILLNESS (per record) Justin Blackburn. is a 69 y.o. male with a past medical history significant for A. 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 He completed his Pfizer vaccine on 9/29 and 10/20, but became symptomatic with Covid on 10/28.  He was admitted on 10/31 for hypoxia.  Overall his course was felt to have been an uncomplicated one though his D-dimer was rising and there was plan for lower extremity duplex to evaluate for DVT today. He was last seen well by nursing at 2 PM, and on PT evaluation at 4:15 PM he was found to not be moving the left side well for which code stroke was activated.  As he last received Eliquis in the morning he was not a TPA candidate but head CT revealed no clear acute process and CTA revealed a P2 cut off for which he was taken to thrombectomy LKW: 2 PM, time of discovery 4:15 PM tPA given?: No, due to Eliquis IA performed?: Yes Premorbid modified rankin scale:      0 - No symptoms.   INTERVAL HISTORY His RN is at bedside. Intubated. More alert today. Following some commands.    OBJECTIVE Vitals:   04/21/20 1000 04/21/20 1100 04/21/20 1111 04/21/20 1200  BP: (!) 164/46 (!) 168/72 (!) 171/71 (!) 169/72  Pulse: (!) 103 (!) 101 92 83  Resp: (!) 22 (!) 23 (!) 21 (!) 27  Temp:      TempSrc:      SpO2: 92% 92% 94% 91%  Weight:      Height:        CBC:  Recent Labs  Lab 04/18/20 0233 04/18/20 0233 04/23/2020 0024 04/27/2020 2151 04/20/20 0434 04/20/20 0434 04/21/20 0555 04/21/20 1205  WBC 14.9*   < > 14.2*  --  18.6*  --  19.7*  --   NEUTROABS 13.7*  --  12.9*  --   --   --   --   --   HGB 12.6*   < > 13.7   <  > 10.0*   < > 11.8* 11.6*  HCT 38.3*   < > 42.8   < > 32.9*   < > 36.3* 34.0*  MCV 80.5   < > 80.8  --  85.7  --  80.8  --   PLT 380   < > 397  --  363  --  361  --    < > = values in this interval not displayed.    Basic Metabolic Panel:  Recent Labs  Lab 04/15/20 0450 04/16/20 0257 04/18/20 0233 04/18/20 0233 04/17/2020 0024 05/11/2020 2151 04/20/20 1332 04/20/20 1332 04/21/20 0555 04/21/20 1205  NA 143   < > 138   < > 141   < > 139   < > 142 143  K 4.3   < > 3.8   < > 3.8   < > 4.9   < > 4.3 4.4  CL 109   < > 105   < > 105   < > 108  --  110  --   CO2 23   < > 22   < > 23   < > 19*  --  19*  --  GLUCOSE 225*   < > 168*   < > 149*   < > 261*  --  206*  --   BUN 45*   < > 75*   < > 78*   < > 87*  --  110*  --   CREATININE 2.93*   < > 2.79*   < > 2.61*   < > 3.36*  --  4.62*  --   CALCIUM 8.6*   < > 8.7*   < > 8.9   < > 7.7*  --  8.2*  --   MG 2.0   < > 1.9  --  2.0  --   --   --   --   --   PHOS 2.8  --   --   --   --   --   --   --   --   --    < > = values in this interval not displayed.    Lipid Panel:     Component Value Date/Time   CHOL 94 04/20/2020 0434   CHOL 129 09/21/2017 1352   TRIG 128 04/20/2020 0434   TRIG 126 04/20/2020 0434   HDL 25 (L) 04/20/2020 0434   HDL 31 (L) 09/21/2017 1352   CHOLHDL 3.8 04/20/2020 0434   VLDL 26 04/20/2020 0434   LDLCALC 43 04/20/2020 0434   LDLCALC 70 09/21/2017 1352   HgbA1c:  Lab Results  Component Value Date   HGBA1C 7.4 (H) 04/20/2020   Urine Drug Screen: No results found for: LABOPIA, COCAINSCRNUR, LABBENZ, AMPHETMU, THCU, LABBARB  Alcohol Level No results found for: [facility]  IMAGING  CT Code Stroke CTA Head W/WO contrast CT Code Stroke CTA Neck W/WO contrast 05/05/2020 IMPRESSION:  1. Occlusion right posterior cerebral artery which appears acute.  2. Moderate stenosis distal right vertebral artery and severe stenosis distal left vertebral artery. Moderate stenosis proximal right vertebral artery origin and mild  stenosis origin of left vertebral artery  3. No significant carotid stenosis in the neck. Mild cavernous carotid stenosis on the right and moderate left cavernous carotid stenosis  4. 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  5. Both middle cerebral arteries are patent without significant stenosis.   MR ANGIO HEAD WO CONTRAST 04/20/2020 IMPRESSION:   MRI HEAD  IMPRESSION:  1. 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 Associated petechial hemorrhage without hemorrhagic transformation or significant mass effect.  2. Underlying age-related cerebral atrophy with moderate chronic small vessel ischemic disease.   MRA HEAD  IMPRESSION:  1. Interval correction/resolution of previously seen proximal right PCA occlusion. Severe atheromatous disease seen throughout the underlying right P2 and distal right PCA branches.  2. Extensive atheromatous disease involving the anterior cerebral arteries bilaterally, right worse than left, with severe multifocal right A2 and A3 stenoses.  3. Moderate approximate 50% stenosis involving the cavernous left ICA.  4. Moderate to severe bilateral V4 stenoses as above.   DG Chest Port 1V same Day 05/01/2020 IMPRESSION:  1. Endotracheal tube tip at the level of the clavicular heads. Enteric tube with tip below the diaphragm in the stomach, side-port tentatively visualized in the lower esophagus. Advancement of 5 cm would place the side-port below the diaphragm.  2. Low lung volumes with patchy bibasilar predominant airspace disease, pneumonia versus edema.   DG Abd Portable 1V 04/20/2020  IMPRESSION:  NG tube in the stomach.   DG Abd Portable 1V 04/22/2020 IMPRESSION: Tip of the enteric tube  below the diaphragm in the stomach, side-port in the region of the distal esophagus. Recommend advancement of least 7 cm for optimal placement.   CT HEAD CODE STROKE WO CONTRAST 04/22/2020 IMPRESSION:  1. Small acute cortical  infarct of the distal Right ACA territory. No associated hemorrhage or mass effect. ASPECTS 9.  2. Otherwise stable compared to September.   Neuro Interventional Radiology - Cerebral Angiogram with Intervention - Dr Estanislado Pandy 04/15/2020 - 8:28 PM Complete revascularization of RT PCA with x 1 pass with contact aspiration achieving a TICI 3 revascularization. S/P Balloon angioplasty of severe stenosis of Lt VBJ with a 3.31mm x 15 mm OTW balloon achieving a 60 to 70  % patency.  Transthoracic Echocardiogram  04/20/2020 IMPRESSIONS  1. Left ventricular ejection fraction, by estimation, is 65 to 70%. The  left ventricle has normal function. The left ventricle has no regional  wall motion abnormalities. Left ventricular diastolic parameters are  consistent with Grade I diastolic  dysfunction (impaired relaxation).  2. Right ventricular systolic function is moderately reduced. The right  ventricular size is moderately enlarged.  3. Right atrial size was moderately dilated.  4. The mitral valve is normal in structure. No evidence of mitral valve  regurgitation. No evidence of mitral stenosis.  5. The aortic valve is normal in structure. Aortic valve regurgitation is  not visualized. No aortic stenosis is present.  6. The inferior vena cava is normal in size with greater than 50%  respiratory variability, suggesting right atrial pressure of 3 mmHg.  Conclusion(s)/Recommendation(s): No intracardiac source of embolism   Bilateral Lower Extremity Venous Dopplers  04/20/2020 Summary:  RIGHT:  - There is no evidence of deep vein thrombosis in the lower extremity. LEFT:  - Findings appear improved from previous examination.  - There is no evidence of deep vein thrombosis in the lower extremity.   ECG - SR rate (See cardiology reading for complete details)  EEG  12/19/2019 ABNORMALITY -Continuous slow, generalized IMPRESSION: This study is suggestive of moderate to severe diffuse  encephalopathy, nonspecific etiology. No seizures or epileptiform discharges were seen throughout the recording.   PHYSICAL EXAM Blood pressure (!) 169/72, pulse 83, temperature 97.9 F (36.6 C), temperature source Axillary, resp. rate (!) 27, height 6' (1.829 m), weight 89 kg, SpO2 91 %.  CV: RRR, no M/R/G, left arm peripheral edema Elderly male Intubated and sedated. Head is atraumatic,normocephalic,  open eyes to sternal run, non verbal. Can follow some simple commands "lift arm", Conjugate gaze right preference does not cross the midine to the left. Pupils sluggish and pin point.Blinks on the right not on the left.  +corneals.+dolls eyes. +cough/gag.  Cannot assess facial symmetry or tongue movements due to ETT. W/d to stim slighlt right LE, no w/d to stil LUE.. Moves right UE and LE spontaneously and puposefully . Gait deferred.   ASSESSMENT/PLAN Mr. Arlee Santosuosso. is a 69 y.o. male with history of A. 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 Vaccinated but admitted with hypoxia from Covid on 10/31. On 03/19/20 he was found to be not moving his left side. Not a tPA candidate due to Eliquis. Taken for thrombectomy. IR - Complete revascularization of RT PCA achieving a TICI 3 revascularization. S/P Balloon angioplasty of severe stenosis of Lt VBJ 60 to  70 % patency.  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 No associated hemorrhage or mass effect. ASPECTS 9. Otherwise stable compared to September  CT head - not ordered  MRI head - Acute ischemic  right ACA and PCA territory infarcts as above. Associated petechial hemorrhage without hemorrhagic transformation or significant mass effect. Underlying age-related cerebral atrophy with moderate chronic small vessel ischemic disease.   MRA head -  Interval correction/resolution of previously seen proximal right PCA occlusion. Severe atheromatous disease seen throughout the underlying right P2 and distal right PCA branches. Extensive atheromatous disease involving the anterior cerebral arteries bilaterally, right worse than left, with severe multifocal right A2 and A3 stenoses. Moderate approximate 50% stenosis involving the cavernous left ICA. Moderate to severe bilateral V4 stenoses  CTA H&N - Occlusion right posterior cerebral artery which appears acute. Moderate stenosis distal right vertebral artery and severe stenosis distal left vertebral artery. Moderate stenosis proximal right vertebral artery origin and mild stenosis origin of left vertebral artery. 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  CT Perfusion - not ordered  Carotid Doppler - CTA neck ordered - carotid dopplers not indicated.  2D Echo - EF 65 - 70%. No cardiac source of emboli identified. '  Sars Corona Virus 2 - positive (6 days ago)  Bilateral Lower Extremity Venous Dopplers - negative for DVT  EEG - This study is suggestive of moderate to severe diffuse encephalopathy, nonspecific etiology. No seizures or epileptiform discharges were seen throughout the recording. (see above)  LDL - 43  HgbA1c - 7.4  UDS - not ordered  VTE prophylaxis - SCDs Diet  Diet Order            Diet NPO time specified  Diet effective now                 Eliquis (apixaban) daily prior to admission, now on aspirin 325 mg daily. Consider restaring Eliquis Monday, Dr. Clydene Fake team to address  Patient counseled to be compliant with his antithrombotic medications  Ongoing aggressive stroke risk factor  management  Therapy recommendations:  pending  Disposition:  Pending  Hypertension  Home BP meds: Cozaar  Current BP meds: Norvasc  Stable  Permissive hypertension (OK if < 180/100) but gradually normalize in 5-7 days   Long-term BP goal normotensive  Hyperlipidemia  Home Lipid lowering medication: Crestor 40 mg daily  LDL 43, goal < 70  Current lipid lowering medication: Crestor 40 mg daily  Continue statin at discharge  Diabetes  Home diabetic meds: metformin  Current diabetic meds: insulin  HgbA1c 7.4, goal < 7.0 Recent Labs    04/21/20 0309 04/21/20 0848 04/21/20 1133  GLUCAP 192* 213* 221*    Other Stroke Risk Factors  Advanced age  Former cigarette smoker - quit  Coronary artery disease  Congestive Heart Failure  Atrial fibrillation  Other Active Problems  Code status - Full code  NPO - tube feeds Anemia - Hgb - 10.0->11.6  Leukocytosis - WBC's - 18.6->19.7  (afebrile) (steroids)   CKD - stage 4 - creatinine - 2.44->4.62 (check daily)   Diffuse moderate to severe cerebrovascular disease.  [facility] day # 7  Eliquis (apixaban) daily prior to admission for afib, now on aspirin 325 mg daily. Consider restaring Eliquis Monday, Dr. Clydene Fake team to address Personally examined  patient and images, and have participated in and made any corrections needed to history, physical, neuro exam,assessment and plan as stated above.  I have personally obtained the history, evaluated lab date, reviewed imaging studies and agree with radiology interpretations.    Sarina Ill, MD Stroke Neurology  This patient is critically ill and at significant risk of neurological worsening, death and care requires constant monitoring of vital signs, hemodynamics,respiratory and cardiac monitoring,review of multiple databases, neurological assessment, discussion with family, other specialists and medical decision making of high  complexity.I  I spent 30 minutes of neurocritical care time in the care of this patient.      To contact Stroke Continuity provider, please refer to [email or web address] After hours, contact General Neurology

## 2020-04-22 ENCOUNTER — Inpatient Hospital Stay (HOSPITAL_COMMUNITY): Payer: Medicare HMO

## 2020-04-22 DIAGNOSIS — I639 Cerebral infarction, unspecified: Secondary | ICD-10-CM | POA: Diagnosis not present

## 2020-04-22 LAB — COMPREHENSIVE METABOLIC PANEL
ALT: 8 U/L (ref 0–44)
AST: 22 U/L (ref 15–41)
Albumin: 2.3 g/dL — ABNORMAL LOW (ref 3.5–5.0)
Alkaline Phosphatase: 79 U/L (ref 38–126)
Anion gap: 13 (ref 5–15)
BUN: 144 mg/dL — ABNORMAL HIGH (ref 8–23)
CO2: 20 mmol/L — ABNORMAL LOW (ref 22–32)
Calcium: 8.2 mg/dL — ABNORMAL LOW (ref 8.9–10.3)
Chloride: 110 mmol/L (ref 98–111)
Creatinine, Ser: 5.73 mg/dL — ABNORMAL HIGH (ref 0.61–1.24)
GFR, Estimated: 10 mL/min — ABNORMAL LOW (ref >60)
Glucose, Bld: 188 mg/dL — ABNORMAL HIGH (ref 70–99)
Potassium: 4.7 mmol/L (ref 3.5–5.1)
Sodium: 143 mmol/L (ref 135–145)
Total Bilirubin: 0.4 mg/dL (ref 0.3–1.2)
Total Protein: 5 g/dL — ABNORMAL LOW (ref 6.5–8.1)

## 2020-04-22 LAB — POCT I-STAT 7, (LYTES, BLD GAS, ICA,H+H)
Acid-base deficit: 3 mmol/L — ABNORMAL HIGH (ref 0.0–2.0)
Bicarbonate: 21.4 mmol/L (ref 20.0–28.0)
Calcium, Ion: 1.18 mmol/L (ref 1.15–1.40)
HCT: 35 % — ABNORMAL LOW (ref 39.0–52.0)
Hemoglobin: 11.9 g/dL — ABNORMAL LOW (ref 13.0–17.0)
O2 Saturation: 91 %
Patient temperature: 98.9
Potassium: 4.7 mmol/L (ref 3.5–5.1)
Sodium: 144 mmol/L (ref 135–145)
TCO2: 23 mmol/L (ref 22–32)
pCO2 arterial: 37.3 mmHg (ref 32.0–48.0)
pH, Arterial: 7.369 (ref 7.350–7.450)
pO2, Arterial: 63 mmHg — ABNORMAL LOW (ref 83.0–108.0)

## 2020-04-22 LAB — GLUCOSE, CAPILLARY
Glucose-Capillary: 173 mg/dL — ABNORMAL HIGH (ref 70–99)
Glucose-Capillary: 174 mg/dL — ABNORMAL HIGH (ref 70–99)
Glucose-Capillary: 148 mg/dL — ABNORMAL HIGH (ref 70–99)
Glucose-Capillary: 100 mg/dL — ABNORMAL HIGH (ref 70–99)
Glucose-Capillary: 125 mg/dL — ABNORMAL HIGH (ref 70–99)

## 2020-04-22 LAB — MAGNESIUM: Magnesium: 2.8 mg/dL — ABNORMAL HIGH (ref 1.7–2.4)

## 2020-04-22 LAB — HEPARIN LEVEL (UNFRACTIONATED)
Heparin Unfractionated: >2.2 IU/mL — ABNORMAL HIGH (ref 0.30–0.70)
Heparin Unfractionated: >2.2 IU/mL — ABNORMAL HIGH (ref 0.30–0.70)

## 2020-04-22 LAB — CBC
HCT: 35.8 % — ABNORMAL LOW (ref 39.0–52.0)
Hemoglobin: 11.3 g/dL — ABNORMAL LOW (ref 13.0–17.0)
MCH: 25.6 pg — ABNORMAL LOW (ref 26.0–34.0)
MCHC: 31.6 g/dL (ref 30.0–36.0)
MCV: 81.2 fL (ref 80.0–100.0)
Platelets: 334 10*3/uL (ref 150–400)
RBC: 4.41 MIL/uL (ref 4.22–5.81)
RDW: 18.6 % — ABNORMAL HIGH (ref 11.5–15.5)
WBC: 18.9 10*3/uL — ABNORMAL HIGH (ref 4.0–10.5)
nRBC: 0.2 % (ref 0.0–0.2)

## 2020-04-22 LAB — PHOSPHORUS: Phosphorus: 5.7 mg/dL — ABNORMAL HIGH (ref 2.5–4.6)

## 2020-04-22 LAB — APTT: aPTT: 122 seconds — ABNORMAL HIGH (ref 24–36)

## 2020-04-22 MED ORDER — INSULIN GLARGINE 100 UNIT/ML ~~LOC~~ SOLN
22.0000 [IU] | Freq: Two times a day (BID) | SUBCUTANEOUS | Status: DC
  Administered 2020-04-22: 22 [IU] via SUBCUTANEOUS
  Filled 2020-04-22 (×3): qty 0.22

## 2020-04-22 MED ORDER — PROSOURCE TF PO LIQD
45.0000 mL | Freq: Every day | ORAL | Status: DC
  Administered 2020-04-22 – 2020-04-24 (×3): 45 mL
  Filled 2020-04-22 (×3): qty 45

## 2020-04-22 MED ORDER — SODIUM CHLORIDE 0.9 % IV SOLN: INTRAVENOUS | Status: DC

## 2020-04-22 MED ORDER — ASPIRIN EC 81 MG PO TBEC
81.0000 mg | DELAYED_RELEASE_TABLET | Freq: Every day | ORAL | Status: DC
  Filled 2020-04-22: qty 1

## 2020-04-22 MED ORDER — METHYLPREDNISOLONE SODIUM SUCC 125 MG IJ SOLR
50.0000 mg | Freq: Every day | INTRAMUSCULAR | Status: DC
  Administered 2020-04-23: 50 mg via INTRAVENOUS
  Filled 2020-04-22: qty 2

## 2020-04-22 MED ORDER — HEPARIN (PORCINE) 25000 UT/250ML-% IV SOLN
950.0000 [IU]/h | INTRAVENOUS | Status: DC
  Administered 2020-04-23: 850 [IU]/h via INTRAVENOUS
  Administered 2020-04-24: 950 [IU]/h via INTRAVENOUS
  Filled 2020-04-22: qty 250

## 2020-04-22 MED ORDER — JEVITY 1.2 CAL PO LIQD
1000.0000 mL | ORAL | Status: DC
  Administered 2020-04-22 – 2020-04-23 (×2): 1000 mL
  Filled 2020-04-22 (×7): qty 1000

## 2020-04-22 MED ORDER — HEPARIN (PORCINE) 25000 UT/250ML-% IV SOLN
1200.0000 [IU]/h | INTRAVENOUS | Status: DC
  Administered 2020-04-22: 1200 [IU]/h via INTRAVENOUS
  Filled 2020-04-22: qty 250

## 2020-04-22 NOTE — Progress Notes (Signed)
Assisted tele visit to patient with wife.  Hadi Dubin P, RN  

## 2020-04-22 NOTE — Progress Notes (Signed)
NAME:  Zelma Mazariego., MRN:  [id number], DOB:  [birth year], LOS: 19 ADMISSION DATE:  04/08/2020, CONSULTATION DATE:  11/5 REFERRING MD:  Sloan Leiter, CHIEF COMPLAINT:  Vent mgmt    Brief History   69 y o male with hx AFib on Eliquis, CHF, DM, CAD (CABG 2007), HTN, esophageal cancer initially admitted 10/31 with COVID PNA and respiratory failure.  On 11/5 he was noted to have L sided weakness and w/u revealed R PCA occlusion and he was taken for IR revascularization.    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   Significant Hospital Events     Consults:  Neuro PCCM  Procedures:  IR revascularization R PCA occlusion 11/5 ETT 11/5>>>  Significant Diagnostic Tests:  CT head/ CTA head/neck 11/5>>> 1. Occlusion right posterior cerebral artery which appears acute.  2. Moderate stenosis distal right vertebral artery and severe stenosis distal left vertebral artery. Moderate stenosis proximal right vertebral artery origin and mild stenosis origin of left vertebral artery  3. No significant carotid stenosis in the neck. Mild cavernous carotid stenosis on the right and moderate left cavernous carotid Stenosis  4. 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 5. Both middle cerebral arteries are patent without significant Stenosis.  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 Interval correction/resolution of previously seen proximal right PCA  occlusion. Severe atheromatous disease seen throughout the underlying right P2 and distal right PCA branches. 2. Extensive atheromatous disease involving the anterior cerebral arteries bilaterally, right worse than left, with severe multifocal right A2 and A3 stenoses. 3. Moderate approximate 50% stenosis involving the cavernous left ICA. 4. Moderate to severe bilateral V4 stenoses as above.   Micro Data:  BC x2 10/31>>> neg  Urine 10/31>>> insig growth  COVID 10/31>>> POS   Antimicrobials:    Interim history/subjective:  Patient is following commands on right side, he is tolerating pressure support trial 5/5.  We will repeat ABG if PaO2 is acceptable then will extubate him today  Objective   Blood pressure (!) 182/67, pulse 94, temperature 98.9 F (37.2 C), temperature source Axillary, resp. rate (!) 28, height 6' (1.829 m), weight 89 kg, SpO2 95 %.    Vent Mode: PSV;CPAP FiO2 (%):  [40 %-50 %] 40 % Set Rate:  [20 bmp] 20 bmp Vt Set:  [620 mL] 620 mL PEEP:  [8 cmH20] 8 cmH20 Pressure Support:  [5 cmH20] 5 cmH20 Plateau Pressure:  [4 cmH20-33 cmH20] 33 cmH20   Intake/Output Summary (Last 24 hours) at 04/22/2020 0924 Last data filed at 04/22/2020 0600 Gross per 24 hour  Intake 816.26 ml  Output 850 ml  Net -33.74 ml   Filed Weights   04/05/2020 2102 05/02/2020 2115  Weight: 94.3 kg 89 kg    Examination: General: Chronically ill appearing male, orally intubated HENT: Atraumatic, normocephalic, moist mucous membrane.  ETT and OGT in place Lungs: Reduced air entry at the bases bilaterally, no wheezes or rhonchi Cardiovascular: Regular rate and rhythm, no murmur or gallop Abdomen: soft, bowel sounds present Extremities: warm and dry, no edema  Neuro: Patient  keeps his eyes closed forcefully, following commands, right upper and lower extremity is antigravity, left lower extremity 2/5, left upper extremity 0/5 Skin: Warm to touch, no rash  Resolved Hospital Problem list      Assessment & Plan:  Acute hypoxic/hypercapnic respiratory failure - multifactorial in setting covid pna now with stroke  COVID PNA Continue lung protective mechanical ventilation Patient has been tolerating pressure support trial, will repeat ABG in half an hour, if PaO2 is acceptable, then will extubate him today Continue Solu-Medrol Patient completed therapy with remdesivir and Tocilizumab  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 Fib Patient's heart rate is well controlled Holding eliquis for now  Continue amiodarone   HTN  Goal SBP 120-160 Now he is off clevidipine Continue amlodipine and HCTZ  Poorly controlled DM with hyperglycemia Fingerstick glucose is better controlled Continue Lantus to 25 units twice daily Continue sliding scale and tube feed coverage and sliding scale insulin  AKI on CKD stage IV Patient serum creatinine continue to trend up He was given Lasix challenge yesterday with 120 mg, he put out only 600 cc of urine Consult nephrology today  Best practice:  Diet: NPO, tube feeds Pain/Anxiety/Delirium protocol (if indicated): As needed fentanyl VAP protocol (if indicated): Yes DVT prophylaxis: SCD's GI prophylaxis: PPI Glucose control: SSI/basal Mobility: BR Code Status: full Family Communication: Patient's wife was updated over the phone Disposition: ICU  Labs   CBC: Recent Labs  Lab 04/16/20 0257 04/16/20 0257 04/17/20 0021 04/17/20 0021 04/18/20 0233 04/18/20 0233 04/27/2020 0024 04/25/2020 2151 04/20/20 0434 04/21/20 0555 04/21/20 1205 04/21/20 2013 04/22/20 0544  WBC 8.0   < > 11.4*   < > 14.9*  --  14.2*  --  18.6* 19.7*  --   --  18.9*  NEUTROABS 6.8  --  10.3*  --  13.7*  --  12.9*  --    --   --   --   --   --   HGB 11.7*   < > 12.1*   < > 12.6*   < > 13.7   < > 10.0* 11.8* 11.6* 11.9* 11.3*  HCT 36.8*   < > 37.5*   < > 38.3*   < > 42.8   < > 32.9* 36.3* 34.0* 35.0* 35.8*  MCV 80.9   < > 82.1   < > 80.5  --  80.8  --  85.7 80.8  --   --  81.2  PLT 288   < > 372   < > 380  --  397  --  363 361  --   --  334   < > = values in this interval not displayed.    Basic Metabolic Panel: Recent Labs  Lab 04/16/20 0257 04/16/20 0257 04/17/20 0021 04/17/20 0021 04/18/20 0233 04/18/20 0233 04/26/2020 0024 05/11/2020 2151 04/20/20 0434 04/20/20 0434 04/20/20 1332 04/21/20 0555 04/21/20 1205 04/21/20 2013 04/22/20 0544  NA 139   < > 137   < > 138   < > 141   < > 142   < > 139 142 143 143 143  K 3.9   < > 4.3   < > 3.8   < > 3.8   < > 4.5   < > 4.9 4.3 4.4 4.7  4.7  CL 109   < > 104   < > 105   < > 105  --  112*  --  108 110  --   --  110  CO2 19*   < > 21*   < > 22   < > 23  --  17*  --  19* 19*  --   --  20*  GLUCOSE 169*   < > 181*   < > 168*   < > 149*  --  250*  --  261* 206*  --   --  188*  BUN 54*   < > 64*   < > 75*   < > 78*  --  70*  --  87* 110*  --   --  144*  CREATININE 2.30*   < > 2.42*   < > 2.79*   < > 2.61*  --  2.44*  --  3.36* 4.62*  --   --  5.73*  CALCIUM 8.5*   < > 8.8*   < > 8.7*   < > 8.9  --  6.8*  --  7.7* 8.2*  --   --  8.2*  MG 2.0  --  2.0  --  1.9  --  2.0  --   --   --   --   --   --   --   --    < > = values in this interval not displayed.   GFR: Estimated Creatinine Clearance: 13 mL/min (A) (by C-G formula based on SCr of 5.73 mg/dL (H)). Recent Labs  Lab 05/06/2020 0024 04/20/20 0434 04/21/20 0555 04/22/20 0544  WBC 14.2* 18.6* 19.7* 18.9*    Liver Function Tests: Recent Labs  Lab 04/18/20 0233 04/27/2020 0024 04/20/20 0434 04/21/20 0555 04/22/20 0544  AST 20 20 21 20 22   ALT 15 17 14 11 8   ALKPHOS 82 93 66 77 79  BILITOT 0.5 0.7 0.7 0.6 0.4  PROT 5.9* 6.4* 4.6* 5.0* 5.0*  ALBUMIN 2.5* 2.7* 2.0* 2.4* 2.3*   No results for  input(s): LIPASE, AMYLASE in the last 168 hours. No results for input(s): AMMONIA in the last 168 hours.  ABG    Component Value Date/Time   PHART 7.335 (L) 04/21/2020 2013   PCO2ART 40.8 04/21/2020 2013   PO2ART 98 04/21/2020 2013   HCO3 21.7 04/21/2020 2013   TCO2 23 04/21/2020 2013   ACIDBASEDEF 4.0 (H) 04/21/2020 2013   O2SAT 97.0 04/21/2020 2013     Coagulation Profile: No results for input(s): INR, PROTIME in the last 168 hours.  Cardiac Enzymes: No results for input(s): CKTOTAL, CKMB, CKMBINDEX, TROPONINI in the last 168 hours.  HbA1C: Hgb A1c MFr Bld  Date/Time Value Ref Range Status  04/20/2020 04:34 AM 7.4 (H) 4.8 - 5.6 % Final    Comment:    (NOTE) Pre diabetes:          5.7%-6.4%  Diabetes:              >6.4%  Glycemic control for   <7.0% adults with diabetes   04/15/2020 04:50 AM 7.0 (H) 4.8 - 5.6 % Final    Comment:    (NOTE) Pre diabetes:          5.7%-6.4%  Diabetes:              >6.4%  Glycemic control for   <7.0% adults with diabetes     CBG: Recent Labs  Lab 04/21/20  1615 04/21/20 2114 04/21/20 2304 04/22/20 0526 04/22/20 0850  GLUCAP 244* 222* 226* 173* 174*    Review of Systems:   Unable to obtain as patient is intubated and sedated  Past Medical History  He,  has a past medical history of A-fib ([place]), Acute kidney injury ([place]), Anemia, Angina, Arthritis, Atrial flutter, paroxysmal ([place]), Cancer ([place]), CHF (congestive heart failure) ([place]), Chronic lower back pain, Coronary artery disease, DVT (deep venous thrombosis) ([place]), Dyspnea, Erectile dysfunction, Heart failure ([place]), Hypercholesteremia, Hypertension, 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   Surgical History    Past Surgical History:  Procedure Laterality Date  . CARDIOVERSION N/A 01/29/2015   Procedure: CARDIOVERSION;  Surgeon:  [name], MD;  Location: [facility];  Service: Cardiovascular;  Laterality: N/A;  . CARDIOVERSION N/A 04/26/2018   Procedure: CARDIOVERSION;  Surgeon: Josue Hector, MD;  Location: [facility] ENDOSCOPY;  Service: Cardiovascular;  Laterality: N/A;  . CORONARY ANGIOPLASTY WITH STENT PLACEMENT  08/31/11   "2 today; makes total of 3 stents"  . CORONARY ANGIOPLASTY WITH STENT PLACEMENT  08/10/2017   "2 today; makes a total of 5 stents" (08/10/2017)  . CORONARY ARTERY BYPASS GRAFT  1998; 2007   CABG X 4; CABG X4  . CORONARY STENT INTERVENTION N/A 08/10/2017   Procedure: CORONARY STENT INTERVENTION;  Surgeon: Sherren Mocha, MD;  Location: [facility];  Service: Cardiovascular;  Laterality: N/A;  . ESOPHAGOGASTRODUODENOSCOPY (EGD) WITH PROPOFOL N/A 09/01/2017   Procedure: ESOPHAGOGASTRODUODENOSCOPY (EGD) WITH PROPOFOL;  Surgeon: Laurence Spates, MD;  Location: [facility];  Service: Endoscopy;  Laterality: N/A;  . EUS N/A 09/29/2017   Procedure: UPPER ENDOSCOPIC ULTRASOUND (EUS) LINEAR;  Surgeon: Arta Silence, MD;  Location: [facility] ENDOSCOPY;  Service: Endoscopy;  Laterality: N/A;  . LEFT HEART CATH AND CORS/GRAFTS ANGIOGRAPHY N/A 08/10/2017   Procedure: LEFT HEART CATH AND CORS/GRAFTS ANGIOGRAPHY;  Surgeon: Sherren Mocha, MD;  Location: [facility];  Service: Cardiovascular;  Laterality: N/A;  . LEFT HEART CATHETERIZATION WITH CORONARY/GRAFT ANGIOGRAM N/A 08/31/2011   Procedure: LEFT HEART CATHETERIZATION WITH Beatrix Fetters;  Surgeon: [name], MD;  Location: [facility] CATH LAB;  Service: Cardiovascular;  Laterality: N/A;  . PERIPHERAL VASCULAR CATHETERIZATION N/A 04/27/2016   Procedure: Abdominal Aortogram w/Lower Extremity;  Surgeon: Angelia Mould, MD;  Location: [facility];  Service: Cardiovascular;  Laterality: N/A;  . RADIOLOGY WITH ANESTHESIA N/A 05/13/2020   Procedure: IR WITH ANESTHESIA;  Surgeon: Radiologist, Medication, MD;  Location: [facility];   Service: Radiology;  Laterality: N/A;  . TEE WITHOUT CARDIOVERSION N/A 01/29/2015   Procedure: TRANSESOPHAGEAL ECHOCARDIOGRAM (TEE);  Surgeon: [name], MD;  Location: [facility] ENDOSCOPY;  Service: Cardiovascular;  Laterality: N/A;     Social History   reports that he quit smoking about 48 years ago. His smoking use included cigarettes. He has a 12.00 pack-year smoking history. He has never used smokeless tobacco. He reports that he does not drink alcohol and does not use drugs.   Family History   His family history includes Diabetes in his father and mother; Heart attack in his brother, father, and mother; Heart disease in his father and mother. There is no history of Stroke.   Allergies No Known Allergies   Home Medications  Prior to Admission medications   Medication Sig Start Date End Date Taking? Authorizing Provider  amiodarone (PACERONE) 200 MG tablet TAKE 1 TABLET BY MOUTH EVERY  DAY Patient taking differently: Take 200 mg by mouth daily.  03/21/20  Yes Belva Crome, MD  ELIQUIS 5 MG TABS tablet TAKE 1 TABLET BY MOUTH TWICE A DAY 05/11/2020   Belva Crome, MD  gabapentin (NEURONTIN) 300 MG capsule Take 300 mg by mouth 2 (two) times daily.    Yes [provider]  insulin degludec (TRESIBA FLEXTOUCH) 100 UNIT/ML FlexTouch Pen Inject 30 Units into the skin daily.    Yes [provider]  losartan (COZAAR) 50 MG tablet Take 1 tablet (50 mg total) by mouth daily. 03/14/20  Yes Belva Crome, MD  metFORMIN (GLUCOPHAGE) 1000 MG tablet Take 1,000 mg by mouth 2 (two) times daily with a meal. 05/19/18  Yes [provider]  nitroGLYCERIN (NITROSTAT) 0.4 MG SL tablet PLACE 1 TABLET UNDER THE TONGUE EVERY 5 (FIVE) MINUTES AS NEEDED FOR CHEST PAIN (ANGINA). Patient taking differently: Place 0.4 mg under the tongue every 5 (five) minutes as needed for chest pain.  01/23/20  Yes Belva Crome, MD  pantoprazole (PROTONIX) 40 MG tablet TAKE 1 TABLET BY MOUTH EVERY  DAY Patient taking differently: Take 40 mg by mouth daily.  10/23/19  Yes Belva Crome, MD  rosuvastatin (CRESTOR) 40 MG tablet Take 1 tablet (40 mg total) by mouth daily. 04/20/19 05/14/20 Yes Belva Crome, MD    Total critical care time: 35 minutes  Performed by: [place] care time was exclusive of separately billable procedures and treating other patients.   Critical care was necessary to treat or prevent imminent or life-threatening deterioration.   Critical care was time spent personally by me on the following activities: development of treatment plan with patient and/or surrogate as well as nursing, discussions with consultants, evaluation of patient's response to treatment, examination of patient, obtaining history from patient or surrogate, ordering and performing treatments and interventions, ordering and review of laboratory studies, ordering and review of radiographic studies, pulse oximetry and re-evaluation of patient's condition.   Jacky Kindle MD Critical care physician [facility]  Pager: [phone] Mobile: [phone]

## 2020-04-22 NOTE — Procedures (Signed)
Cortrak  Tube Type:  Cortrak - 43 inches Tube Location:  Left nare Initial Placement:  Stomach Secured by: Bridle Technique Used to Measure Tube Placement:  Documented cm marking at nare/ corner of mouth Cortrak Secured At:  75 cm    Cortrak Tube Team Note:  Consult received to place a Cortrak feeding tube.   X-ray is required, abdominal x-ray has been ordered by the Cortrak team. Please confirm tube placement before using the Cortrak tube.   If the tube becomes dislodged please keep the tube and contact the Cortrak team at [email or web address] (password [email or web address]) for replacement.  If after hours and replacement cannot be delayed, place a NG tube and confirm placement with an abdominal x-ray.    Koleen Distance MS, RD, LDN Please refer to [facility] for RD and/or RD on-call/weekend/after hours pager

## 2020-04-22 NOTE — Progress Notes (Signed)
SLP Cancellation Note  Patient Details Name: Justin Blackburn. MRN: [id number] DOB: 07-14-1950   Cancelled treatment:       Reason Eval/Treat Not Completed: Medical issues which prohibited therapy (pt remains on vent this am). Will f/u as able.   Osie Bond., M.A. [facility] Pager [phone] Office [phone]  04/22/2020, 7:11 AM

## 2020-04-22 NOTE — Progress Notes (Signed)
STROKE TEAM PROGRESS NOTE   INTERVAL HISTORY Patient remains intubated for respiratory failure.  He can be barely aroused and does follows some commands but not consistently.  Echocardiogram is unremarkable.  Lower extremity venous Dopplers were negative for DVT.  Vital signs are stable.  OBJECTIVE Vitals:   04/22/20 0800 04/22/20 0819 04/22/20 0825 04/22/20 0900  BP:  (!) 182/67  (!) 173/71  Pulse:  94  93  Resp:  (!) 28  (!) 28  Temp: 99 F (37.2 C)     TempSrc: Axillary     SpO2:  95% 95% 93%  Weight:      Height:       CBC:  Recent Labs  Lab 04/18/20 0233 04/18/20 0233 05/06/2020 0024 05/08/2020 2151 04/21/20 0555 04/21/20 1205 04/22/20 0544 04/22/20 0939  WBC 14.9*   < > 14.2*   < > 19.7*  --  18.9*  --   NEUTROABS 13.7*  --  12.9*  --   --   --   --   --   HGB 12.6*   < > 13.7   < > 11.8*   < > 11.3* 11.9*  HCT 38.3*   < > 42.8   < > 36.3*   < > 35.8* 35.0*  MCV 80.5   < > 80.8   < > 80.8  --  81.2  --   PLT 380   < > 397   < > 361  --  334  --    < > = values in this interval not displayed.   Basic Metabolic Panel:  Recent Labs  Lab 04/18/20 0233 04/18/20 0233 05/07/2020 0024 04/23/2020 2151 04/21/20 0555 04/21/20 1205 04/22/20 0544 04/22/20 0939  NA 138   < > 141   < > 142   < > 143 144  K 3.8   < > 3.8   < > 4.3   < > 4.7 4.7  CL 105   < > 105   < > 110  --  110  --   CO2 22   < > 23   < > 19*  --  20*  --   GLUCOSE 168*   < > 149*   < > 206*  --  188*  --   BUN 75*   < > 78*   < > 110*  --  144*  --   CREATININE 2.79*   < > 2.61*   < > 4.62*  --  5.73*  --   CALCIUM 8.7*   < > 8.9   < > 8.2*  --  8.2*  --   MG 1.9  --  2.0  --   --   --   --   --    < > = values in this interval not displayed.   Lipid Panel:     Component Value Date/Time   CHOL 94 04/20/2020 0434   CHOL 129 09/21/2017 1352   TRIG 128 04/20/2020 0434   TRIG 126 04/20/2020 0434   HDL 25 (L) 04/20/2020 0434   HDL 31 (L) 09/21/2017 1352   CHOLHDL 3.8 04/20/2020 0434   VLDL 26  04/20/2020 0434   LDLCALC 43 04/20/2020 0434   LDLCALC 70 09/21/2017 1352   HgbA1c:  Lab Results  Component Value Date   HGBA1C 7.4 (H) 04/20/2020   Urine Drug Screen: No results found for: LABOPIA, COCAINSCRNUR, LABBENZ, AMPHETMU, THCU, LABBARB  Alcohol Level No results found for: [facility]  IMAGING  CT HEAD CODE STROKE WO CONTRAST 05/03/2020 1. 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 No associated hemorrhage or mass effect. ASPECTS 9.  2. Otherwise stable compared to September.   Neuro Interventional Radiology - Cerebral Angiogram with Intervention - Dr Estanislado Pandy 04/18/2020 - 8:28 PM Complete revascularization of RT PCA with x 1 pass with contact aspiration achieving a TICI 3 revascularization. S/P Balloon angioplasty of severe stenosis of Lt VBJ with a 3.6mm x 15 mm OTW balloon achieving a 60 to 70  % patency.  CT Code Stroke CTA Head W/WO contrast CT Code Stroke CTA Neck W/WO contrast 04/22/2020 1. Occlusion right posterior cerebral artery which appears acute.  2. Moderate stenosis distal right vertebral artery and severe stenosis distal left vertebral artery. Moderate stenosis proximal right vertebral artery origin and mild stenosis origin of left vertebral artery  3. No significant carotid stenosis in the neck. Mild cavernous carotid stenosis on the right and moderate left cavernous carotid stenosis  4. 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  5. Both middle cerebral arteries are patent without significant stenosis.   MRI HEAD  04/20/2020 1. 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 Associated petechial hemorrhage without hemorrhagic transformation or significant mass effect.  2. Underlying age-related cerebral atrophy with moderate chronic small vessel ischemic disease.   MRA HEAD  04/20/2020 1. Interval correction/resolution of previously seen proximal right PCA occlusion. Severe atheromatous disease seen  throughout the underlying right P2 and distal right PCA branches.  2. Extensive atheromatous disease involving the anterior cerebral arteries bilaterally, right worse than left, with severe multifocal right A2 and A3 stenoses.  3. Moderate approximate 50% stenosis involving the cavernous left ICA.  4. Moderate to severe bilateral V4 stenoses as above.   DG Chest Port 1V same Day 04/26/2020 1. Endotracheal tube tip at the level of the clavicular heads. Enteric tube with tip below the diaphragm in the stomach, side-port tentatively visualized in the lower esophagus. Advancement of 5 cm would place the side-port below the diaphragm.  2. Low lung volumes with patchy bibasilar predominant airspace disease, pneumonia versus edema.   Transthoracic Echocardiogram  04/20/2020 1. Left ventricular ejection fraction, by estimation, is 65 to 70%. The left ventricle has normal function. The left ventricle has no regional wall motion abnormalities. Left ventricular diastolic parameters are consistent with Grade I diastolic dysfunction (impaired relaxation).  2. Right ventricular systolic function is moderately reduced. The right ventricular size is moderately enlarged.  3. Right atrial size was moderately dilated.  4. The mitral valve is normal in structure. No evidence of mitral valve regurgitation. No evidence of mitral stenosis.  5. The aortic valve is normal in structure. Aortic valve regurgitation is not visualized. No aortic stenosis is present.  6. The inferior vena cava is normal in size with greater than 50% respiratory variability, suggesting right atrial pressure of 3 mmHg.  Conclusion(s)/Recommendation(s): No intracardiac source of embolism   Bilateral Lower Extremity Venous Dopplers  04/20/2020 RIGHT:  - There is no evidence of deep vein thrombosis in the lower extremity. LEFT:  - Findings appear improved from previous examination.  - There is no evidence of deep vein thrombosis in the  lower extremity.   ECG - SR rate (See cardiology reading for complete details)  EEG  12/19/2019 ABNORMALITY -Continuous slow, generalized IMPRESSION: This study is suggestive of moderate to severe diffuse encephalopathy, nonspecific etiology. No seizures or epileptiform discharges were seen throughout the recording.  PHYSICAL EXAM  Blood pressure (!) 173/71, pulse 93, temperature 99 F (37.2 C), temperature source Axillary, resp. rate (!) 28, height 6' (1.829 m), weight 89 kg, SpO2 93 %. CV: RRR, no M/R/G, left arm peripheral edema Elderly Caucasian male Intubated and sedated. Head is atraumatic,normocephalic,  open eyes to sternal run, non verbal. Can follow some simple commands on left side only., Conjugate gaze right preference does not cross the midine to the left. Pupils sluggish and pin point.Blinks on the right not on the left.  +corneals.+dolls eyes. +cough/gag.  Cannot assess facial symmetry or tongue movements due to ETT. W/d to stim slighlt right LE, no w/d to stil LUE.. Moves right UE and LE spontaneously and puposefully . Gait deferred.    ASSESSMENT/PLAN Mr. Demitrious Mccannon. is a 69 y.o. male with history of A. 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 Vaccinated but admitted with hypoxia from Covid on 10/31. On 03/19/20 he was found to be not moving his left side. Not a tPA candidate due to Eliquis. Taken for thrombectomy. IR - Complete revascularization of RT PCA achieving a TICI 3 revascularization. S/P Balloon angioplasty of severe stenosis of Lt VBJ 60 to 70 % patency.  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 No associated hemorrhage or mass effect. ASPECTS 9. Otherwise stable compared to September  CTA H&N - Occlusion right posterior cerebral artery which appears acute. Moderate stenosis distal right vertebral artery and severe stenosis distal left vertebral artery. Moderate stenosis proximal right vertebral artery origin and mild stenosis origin of left vertebral artery. 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 Associated petechial hemorrhage without hemorrhagic transformation or significant mass effect. Underlying age-related cerebral atrophy with moderate chronic small vessel ischemic disease.   MRA head -  Interval correction/resolution of previously seen proximal right PCA occlusion. Severe atheromatous disease seen throughout the underlying right P2 and distal right PCA branches. Extensive atheromatous disease involving the anterior cerebral arteries bilaterally, right worse than left, with severe multifocal right A2 and A3 stenoses. Moderate approximate 50% stenosis involving the cavernous left ICA. Moderate to severe bilateral V4 stenoses  2D Echo - EF 65 - 70%. No cardiac source of emboli identified. '  Sars Corona Virus 2 - positive (6 days ago)  Bilateral Lower Extremity Venous Dopplers - negative for DVT  EEG - This study is suggestive of moderate to severe diffuse encephalopathy, nonspecific etiology. No seizures or epileptiform discharges were seen throughout the recording.   LDL - 43  HgbA1c - 7.4  VTE prophylaxis - SCDs .   Eliquis (apixaban) daily prior to admission, now on IV heparin till he is able to swallow.  Will consider restarting Eliquis once po access. For now, start IV heparin  stroke protocol. Stop aspirin.    Therapy recommendations:  pending  Disposition:  Pending  Acute hypoxic/hypercapnic respiratory failure - multifactorial in setting covid pna now with stroke  COVID PNA  Intubated  for IR, not extubated following   Intubated  Weaned all day yesterday  Anticipate extubation today  Patient completed therapy with remdesivir and Tocilizumab  CCM on board  Atrial Fibrillation  Home anticoagulation:  Eliquis (apixaban) daily   HR controlled on amiodarone 200 daily  Eliquis on hold  Start IV heparin  Plan resume Eliquis once po access.     Hypertension  Home BP meds: Cozaar  Current BP meds: Norvasc 10   Stable . SBP goal below 180  . Long-term BP goal normotensive  Hyperlipidemia  Home Lipid lowering medication: Crestor 40 mg daily  LDL 43, goal < 70  Current lipid lowering medication: Crestor 40 mg daily  Continue statin at discharge  Diabetes type II, uncontrolled w/ hyperglycemia  Home diabetic meds: Tresiba 30 units QD, Metformin 1000 mg BID  Current diabetic meds: Novolog 6 units Q4H, Lantus 20->22 units BID, Novolog 0-15 units Q4H  HgbA1c 7.4, goal < 7.0 Recent Labs    04/21/20 2304 04/22/20 0526 04/22/20 0850  GLUCAP 226* 173* 174*  DB RN Coordinator following  Dysphagia . Secondary to stroke . NPO - tube feeds  . 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  Hospital day # 8 Recommend start IV heparin stroke protocol per pharmacy.  Will add aspirin 81 mg given balloon angioplasty stenting of the left VB junction.  Wean off ventilatory support  as per critical care team.  Discussed with Dr. Tacy Learn critical care medicine and pharmacist.  No family available for discussion.This patient is critically ill and at significant risk of neurological worsening, death and care requires constant monitoring of vital signs, hemodynamics,respiratory and cardiac monitoring, extensive review of multiple databases, frequent neurological assessment, discussion with family, other specialists and medical decision making of high complexity.I have made any additions or clarifications directly to the above note.This critical care time does not reflect procedure time, or teaching time or supervisory time of PA/NP/Med Resident etc but could involve care discussion time.  I spent 30 minutes of neurocritical care time  in the care of  this patient.    Antony Contras, MD  To contact Stroke Continuity provider, please refer to [email or web address] After hours, contact General Neurology

## 2020-04-22 NOTE — Consult Note (Signed)
Nephrology Consult   Requesting provider: Dr. Tacy Learn Service requesting consult: CCM Reason for consult: AKI   Assessment/Recommendations: Justin Blackburn. is a/an 69 y.o. male with a past medical history of Afib on eliquis, CHF, DMT2, CAD (s/p CABG 2007), HTN, and h/o esophageal cancer initially admitted on 10/31 for COVID-19 PNA and respiratory failure. Subsequently on 11/5 he had left-sided weakness with Right PCA occlusion with IR revascularization.  Non-Oliguric AKI on CKD: Prior to stroke, patient had baseline creatinine in 2.3-2.9 range, but since recognizing CVA on 11/5 and subsequent IR revascularization with contrast, creatinine has worsened daily from 2.44, 3.36, 4.62, to peak of 5.73 today. Appears that patient has some baseline chronic kidney disease as cr was 2.53 in April 2021, in 2020 b/l ranged from 1.3-1.6. Patient had poor renal reserve to begin with due to many comorbidities and baseline CKD so large contrast load on 11/5 caused contrast-induced nephropathy. Patient is not volume overloaded. Recommend gentle hydration with NS 73mL/hr, monitor cr daily. Patient does not require HD today, but may do so if continues to worsen. -Chart reviewed: medications acceptable. -NS $RemoveBeforeD'@50mL'KmHcuEVOzeqoXF$ /hr -Continue to monitor daily Cr, Dose meds for GFR -Monitor Daily I/Os, Daily weight  -Maintain MAP>65 for optimal renal perfusion.  -Avoid nephrotoxic medications including NSAIDs and Vanc/Zosyn combo -Check Renal U/S   Hypertension: ranging from normo to hypertensive: SBP 130-170s. Most recently 131/61. Presently on amlodipine 10 mg, HCTZ 25 mg. Recommending holding HCTZ in setting of AKI so as not to muddy the picture. Home medication: losartan 50 mg. - Hold HCTZ in setting of contrast induced nephropathy - Can consider starting hydralazine 50 mg TID and titrate up  Mild anemia: most likely due to blood draws, procedure. Hgb at admission 13.3, WNL. After revascularization on 11/5, hgb dropped from  13.7 to 10.9. Stable today at 11.9. Continue to monitor. Patient had h/o iron deficiency anemia, which is how his gastric cancer was initially diagnosed, but has been stable for many months without anemia. -Transfuse for Hgb<7 g/dL -No role for ESA in this setting  COVID-19 pneumonia with respiratory failure:  Patient extubated today, no sedatives on board, but patient is not yet fully alert and responsive. Maintaining appropriate SpO2 at >90% on 10L HFNC. Completed remdesivir and tocilizumab therapy. Presently on solumedrol. Patient is afebrile. Patient was intubated on 11/5 for IR procedure for revascularization, prior to procedure, he only required 8-10L HFNC. Patient was fully vaccinated, but received pfizer vaccine on 03/13/20, 04/03/20. - Continue treatment per primary team  Diabetes Mellitus Type 2 with Hyperglycemia: Hgb A1c on 11/1 7%. Home medications include tresiba 30U qd, metformin $RemoveBefor'1000mg'ByXpqhdXVsMg$  BID. Home medications appropriately held, unclear if patient had been seeing a nephrologist before, but had longstanding CKD since 2019. BG appropriate today from 100-180, especially in setting of receiving steroids for COVID-19 therapy. Lantus 22U BID, aspart 11U today. - per primary team  H/o CAD, CABGx2, A. Fib on chronic anticoagulation therapy, combined systolic and diastolilc HF: Patient presently rate and rhythm controlled. Echo on 11/6 with improved EF to 65-70%, normal LV function, no regional wall abnormalities, G1DD. Medications: amiodarone 200 mg qd. Eliquis held, on heparin. - per primary team  H/o T3N0 adenocarcinoma of the GE junction: Patient in active surveillance, last oncology visit in 09/2019 w/o evidence of disease relapse. He was to have a 6 month screening in October, recommend follow up outpatient. S/p carboplatin/paclitaxel From 5-11/2017. - follow up with oncology at discharge  Recommendations conveyed to primary service.   [employer]  Chauncey Reading, MD PGY-2 04/22/2020 12:28  PM _____________________________________________________________________________________ CC: AKI on CKD  History of Present Illness: Justin Blackburn. is a/an 69 y.o. male with a past medical history of A.fib/flutter (on Eliquis), CAD s/p CABG (x2 1998, 2007), CHF (Last EF 40-45% 8/21), TN, HLD, DMT2, PVD, adenocarcinoma of GE junction (s/p radiation and chemo in 11/2017). Patient presented with COVID-19 pneumonia and acute hypoxic failure on 10/31 and was admitted for oxygen and COVID-19 therapies. He was doing well on 8-10L [place] but was then found to have left-sided weakness on the afternoon of 11/5.  Head imaging revealed a R PCA occlusion and IR was subsequently able to revascularize patient. Patient was intubated for IR procedure on 11/5, successfully extubated today on 11/8 and satting well on 10L HFNC. Of note, patient had baseline CKD due to HTN and DMT2. He has not been seen by a nephrologist per primary team daily progress notes. Patient had baseline cr of 1.3-1.6 in 2019, this increased to 2s in 2020. Most recently in April 2021, cr was 2.53. On admission cr stable around 2.5-3, but has been increasing by 1 every 24 hours since contrast load associated with IR procedure and imaging for CVA on 11/5.  Medications:  Current Facility-Administered Medications  Medication Dose Route Frequency Provider Last Rate Last Admin   0.9 %  sodium chloride infusion  250 mL Intravenous Continuous Whiteheart, Cristal Ford, NP       acetaminophen (TYLENOL) tablet 650 mg  650 mg Oral Q4H PRN Deveshwar, Willaim Rayas, MD       Or   acetaminophen (TYLENOL) 160 MG/5ML solution 650 mg  650 mg Per Tube Q4H PRN Luanne Bras, MD   650 mg at 04/22/20 0114   Or   acetaminophen (TYLENOL) suppository 650 mg  650 mg Rectal Q4H PRN Deveshwar, Willaim Rayas, MD       amiodarone (PACERONE) tablet 200 mg  200 mg Per Tube Daily Bhagat, Srishti L, MD   200 mg at 04/22/20 0912   amLODipine (NORVASC) tablet 10 mg  10 mg Per Tube Daily  Bhagat, Srishti L, MD   10 mg at 04/22/20 0906   chlorhexidine gluconate (MEDLINE KIT) (PERIDEX) 0.12 % solution 15 mL  15 mL Mouth Rinse BID Kerney Elbe, MD   15 mL at 04/22/20 0855   Chlorhexidine Gluconate Cloth 2 % PADS 6 each  6 each Topical Daily Aroor, Lanice Schwab, MD   6 each at 04/22/20 0954   docusate (COLACE) 50 MG/5ML liquid 100 mg  100 mg Per Tube BID Bhagat, Srishti L, MD   100 mg at 04/22/20 0910   feeding supplement (JEVITY 1.2 CAL) liquid 1,000 mL  1,000 mL Per Tube Continuous Jacky Kindle, MD 40 mL/hr at 04/22/20 0500 Rate Verify at 04/22/20 0500   heparin ADULT infusion 100 units/mL (25000 units/262mL sodium chloride 0.45%)  1,200 Units/hr Intravenous Continuous Lavenia Atlas, RPH 12 mL/hr at 04/22/20 1207 1,200 Units/hr at 04/22/20 1207   hydrochlorothiazide (HYDRODIURIL) tablet 25 mg  25 mg Per Tube Daily Jacky Kindle, MD   25 mg at 04/22/20 0906   insulin aspart (novoLOG) injection 0-15 Units  0-15 Units Subcutaneous Q4H Jacky Kindle, MD   2 Units at 04/22/20 1210   insulin aspart (novoLOG) injection 6 Units  6 Units Subcutaneous Q4H Jacky Kindle, MD   6 Units at 04/22/20 0852   insulin glargine (LANTUS) injection 22 Units  22 Units Subcutaneous BID Donzetta Starch, NP       Ipratropium-Albuterol (  COMBIVENT) respimat 1 puff  1 puff Inhalation BID Maretta Bees, MD   1 puff at 04/20/2020 0817   Ipratropium-Albuterol (COMBIVENT) respimat 1 puff  1 puff Inhalation Q6H PRN Maretta Bees, MD       MEDLINE mouth rinse  15 mL Mouth Rinse 10 times per day Caryl Pina, MD   15 mL at 04/22/20 1125   methylPREDNISolone sodium succinate (SOLU-MEDROL) 125 mg/2 mL injection 50 mg  50 mg Intravenous Q12H Maretta Bees, MD   50 mg at 04/22/20 0909   midazolam (VERSED) injection 2 mg  2 mg Intravenous Q2H PRN Bernadene Person, NP   2 mg at 04/22/20 0111   nitroGLYCERIN (NITROSTAT) SL tablet 0.4 mg  0.4 mg Sublingual Q5 min PRN Ghimire, Werner Lean, MD        ondansetron (ZOFRAN) tablet 4 mg  4 mg Oral Q6H PRN Rometta Emery, MD       Or   ondansetron (ZOFRAN) injection 4 mg  4 mg Intravenous Q6H PRN Rometta Emery, MD       pantoprazole sodium (PROTONIX) 40 mg/20 mL oral suspension 40 mg  40 mg Per Tube Daily Bhagat, Srishti L, MD   40 mg at 04/22/20 0910   polyethylene glycol (MIRALAX / GLYCOLAX) packet 17 g  17 g Per Tube Daily Bhagat, Srishti L, MD   17 g at 04/22/20 0910   rosuvastatin (CRESTOR) tablet 40 mg  40 mg Per Tube Daily Bhagat, Srishti L, MD   40 mg at 04/22/20 0907     ALLERGIES Patient has no known allergies.  MEDICAL HISTORY Past Medical History:  Diagnosis Date   A-fib [facility])    with RVR   Acute kidney injury (HCC)    Anemia    Angina    Arthritis    "hands" (08/10/2017)   Atrial flutter, paroxysmal (HCC)    arrived with aflutter with RVR, EF down to 25-30%, s/p TEE DCCV on 01/29/2015   Cancer (HCC)    CHF (congestive heart failure) (HCC)    Chronic lower back pain    "all the time from my sciatic nerve"   Coronary artery disease    DVT (deep venous thrombosis) (HCC)    Dyspnea    Erectile dysfunction    Heart failure (HCC)    Hypercholesteremia    Hypertension    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    Myocardial infarction (HCC) 1988; ~1992;  ~1996;~ 2000   Obesity    Peripheral vascular disease (HCC)    Umbilical hernia    unrepaired     SOCIAL HISTORY Social History   Socioeconomic History   Marital status: Married    Spouse name: Not on file   Number of children: Not on file   Years of education: Not on file   Highest education level: Not on file  Occupational History   Not on file  Tobacco Use   Smoking status: Former Smoker    Packs/day: 2.00    Years: 6.00    Pack years: 12.00    Types: Cigarettes    Quit date: 06/16/1971    Years since quitting: 48.8   Smokeless tobacco: Never Used   Tobacco comment: "chewed on cigars;  never really did chew tobacco"  Vaping Use   Vaping Use: Never used  Substance and Sexual Activity   Alcohol use: No   Drug use: No   Sexual activity: Never  Other Topics Concern   Not  on file  Social History Narrative   Not on file   Social Determinants of Health   Financial Resource Strain:    Difficulty of Paying Living Expenses: Not on file  Food Insecurity:    Worried About Charity fundraiser in the Last Year: Not on file   [employer] of Food in the Last Year: Not on file  Transportation Needs:    Lack of Transportation (Medical): Not on file   Lack of Transportation (Non-Medical): Not on file  Physical Activity:    Days of Exercise per Week: Not on file   Minutes of Exercise per Session: Not on file  Stress:    Feeling of Stress : Not on file  Social Connections:    Frequency of Communication with Friends and Family: Not on file   Frequency of Social Gatherings with Friends and Family: Not on file   Attends Religious Services: Not on file   Active Member of Clubs or Organizations: Not on file   Attends Archivist Meetings: Not on file   Marital Status: Not on file  Intimate Partner Violence:    Fear of Current or Ex-Partner: Not on file   Emotionally Abused: Not on file   Physically Abused: Not on file   Sexually Abused: Not on file     FAMILY HISTORY Family History  Problem Relation Age of Onset   Heart disease Mother    Heart attack Mother    Diabetes Mother    Heart disease Father    Heart attack Father    Diabetes Father    Heart attack Brother    Stroke Neg Hx       Review of Systems: 12 systems reviewed Otherwise as per HPI, all other systems reviewed and negative  Physical Exam: Vitals:   04/22/20 1157 04/22/20 1200  BP: (!) 130/53 138/66  Pulse: 85 87  Resp: 16 (!) 26  Temp:  98.7 F (37.1 C)  SpO2: 94% 94%   No intake/output data recorded.  Intake/Output Summary (Last 24 hours) at 04/22/2020  1228 Last data filed at 04/22/2020 0600 Gross per 24 hour  Intake 681.04 ml  Output 850 ml  Net -168.96 ml   General: older white gentleman, resting in bed, not fully alert and minimally responding to commands, not in acute distress HEENT: anicteric sclera, oropharynx clear without lesions CV: regular rate, normal rhythm, no murmurs, no gallops, no rubs, no peripheral edema Lungs: coarse rhonchi diffusely, normal work of breathing Abd: soft, non-tender, non-distended Skin: ecchymoses along arms Psych: not alert, will minimally respond to touch, maintaining airway Musculoskeletal: no obvious deformities Neuro: cannot assess as patient is not presently able to follow commands  Test Results Reviewed Lab Results  Component Value Date   NA 144 04/22/2020   K 4.7 04/22/2020   CL 110 04/22/2020   CO2 20 (L) 04/22/2020   BUN 144 (H) 04/22/2020   CREATININE 5.73 (H) 04/22/2020   CALCIUM 8.2 (L) 04/22/2020   ALBUMIN 2.3 (L) 04/22/2020   PHOS 2.8 04/15/2020     I have reviewed all relevant outside healthcare records related to the patient's current hospitalization

## 2020-04-22 NOTE — Progress Notes (Addendum)
ANTICOAGULATION CONSULT NOTE  Pharmacy Consult for heparin Indication: atrial fibrillation  No Known Allergies  Patient Measurements: Height: 6' (182.9 cm) Weight: 86.4 kg (190 lb 7.6 oz) IBW/kg (Calculated) : 77.6 Heparin Dosing Weight: 86 kg   Vital Signs: Temp: 97.3 F (36.3 C) (11/08 2000) Temp Source: Axillary (11/08 2000) BP: 165/82 (11/08 2000) Pulse Rate: 85 (11/08 2000)  Labs: Recent Labs    04/20/20 0434 04/20/20 0434 04/20/20 1332 04/21/20 0555 04/21/20 1205 04/21/20 2013 04/21/20 2013 04/22/20 0544 04/22/20 0939 04/22/20 1811  HGB 10.0*   < >  --  11.8*   < > 11.9*   < > 11.3* 11.9*  --   HCT 32.9*   < >  --  36.3*   < > 35.0*  --  35.8* 35.0*  --   PLT 363  --   --  361  --   --   --  334  --   --   HEPARINUNFRC  --   --   --   --   --   --   --   --   --  >2.20*  CREATININE 2.44*   < > 3.36* 4.62*  --   --   --  5.73*  --   --    < > = values in this interval not displayed.    Estimated Creatinine Clearance: 13 mL/min (A) (by C-G formula based on SCr of 5.73 mg/dL (H)).   Medical History: Past Medical History:  Diagnosis Date  . A-fib ([place])    with RVR  . Acute kidney injury ([place])   . Anemia   . Angina   . Arthritis    "hands" (08/10/2017)  . Atrial flutter, paroxysmal ([place])    arrived with aflutter with RVR, EF down to 25-30%, s/p TEE DCCV on 01/29/2015  . Cancer ([place])   . CHF (congestive heart failure) ([place])   . Chronic lower back pain    "all the time from my sciatic nerve"  . Coronary artery disease   . DVT (deep venous thrombosis) ([place])   . Dyspnea   . Erectile dysfunction   . Heart failure ([place])   . Hypercholesteremia   . Hypertension   . 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 Myocardial infarction ([place]) 1988; ~9924;  ~1996;~ 2000  . Obesity   . Peripheral vascular disease ([name])   . Umbilical hernia    unrepaired    Medications:  Medications Prior to Admission  Medication Sig Dispense Refill Last Dose   . amiodarone (PACERONE) 200 MG tablet TAKE 1 TABLET BY MOUTH EVERY DAY (Patient taking differently: Take 200 mg by mouth daily. ) 90 tablet 3 Past Week at Unknown time  . gabapentin (NEURONTIN) 300 MG capsule Take 300 mg by mouth 2 (two) times daily.    Past Week at Unknown time  . insulin degludec (TRESIBA FLEXTOUCH) 100 UNIT/ML FlexTouch Pen Inject 30 Units into the skin daily.    Past Week at Unknown time  . losartan (COZAAR) 50 MG tablet Take 1 tablet (50 mg total) by mouth daily. 90 tablet 3 Past Week at Unknown time  . metFORMIN (GLUCOPHAGE) 1000 MG tablet Take 1,000 mg by mouth 2 (two) times daily with a meal.   Past Week at Unknown time  . nitroGLYCERIN (NITROSTAT) 0.4 MG SL tablet PLACE 1 TABLET UNDER THE TONGUE EVERY 5 (FIVE) MINUTES AS NEEDED FOR CHEST PAIN (ANGINA). (Patient taking differently: Place 0.4 mg under the  tongue every 5 (five) minutes as needed for chest pain. ) 25 tablet 6 unknown  . pantoprazole (PROTONIX) 40 MG tablet TAKE 1 TABLET BY MOUTH EVERY DAY (Patient taking differently: Take 40 mg by mouth daily. ) 90 tablet 1 Past Week at Unknown time  . rosuvastatin (CRESTOR) 40 MG tablet Take 1 tablet (40 mg total) by mouth daily. 90 tablet 3 Past Week at Unknown time    Assessment: 56 YOM who presented with acute R PCA occlusion s/p endovascular treatment. Patient was on Eliquis PTA for h/o Afib which is currently on hold. Pharmacy consulted to start IV heparin without bolus for secondary stroke prophylaxis in the setting of AFib.  Initial heparin level high (>2.2). Per discussion with RN, believes level was drawn from A-line but was from previous shift. Will recheck heparin level and aPTT to confirm. H/H low stable. Plt wnl. Noted AKI - SCr up to 5.73. No bleeding or issues with infusion per discussion with RN.  Goal of Therapy:  Heparin level 0.3-0.5 units/ml Monitor platelets by anticoagulation protocol: Yes   Plan:  F/u stat repeat heparin level/aPTT Continue IV  heparin at 1200 units/hr for now. No bolus. Monitor daily CBC, and s/sx of bleeding    ADDENDUM: Repeat heparin level remains elevated (>2.2), aPTT high at 122. No bleeding or issues with infusion per discussion with RN.  Plan: Hold heparin x 1 hr and resume at lower rate 900 units/hr at 2215 - discussed plan with RN Recheck heparin level 8 hrs from resumption Monitor daily heparin level and CBC, s/sx bleeding   Arturo Morton, PharmD, BCPS Please check AMION for all [place] contact numbers Clinical Pharmacist 04/22/2020 8:09 PM

## 2020-04-22 NOTE — Progress Notes (Signed)
PT Cancellation Note  Patient Details Name: Justin Blackburn. MRN: [id number] DOB: Oct 08, 1950   Cancelled Treatment:    Reason Eval/Treat Not Completed: Patient not medically ready;Medical issues which prohibited therapy.  This am, pt was being extubate and therapy held to see if appropriate in pm.  This pm unable to get back around to pt.  Will see as appropriate 11/9. 04/22/2020  Ginger Carne., PT Acute Rehabilitation Services [phone]  (pager) [phone]  (office)   Tessie Fass Audley Hinojos 04/22/2020, 5:03 PM

## 2020-04-22 NOTE — Progress Notes (Signed)
Called pt's wife and updated on pt's current condition since extubation + new heparin medication. Reinforced that pt's wife/family will be unable to come up to see pt-wife verbalizes understanding and all questions answered at this time. Calling E-link to set up video call now. Will continue to monitor.

## 2020-04-22 NOTE — Progress Notes (Signed)
ANTICOAGULATION CONSULT NOTE - Initial Consult  Pharmacy Consult for heparin Indication: atrial fibrillation  No Known Allergies  Patient Measurements: Height: 6' (182.9 cm) Weight: 86.4 kg (190 lb 7.6 oz) IBW/kg (Calculated) : 77.6 Heparin Dosing Weight: 86 kg   Vital Signs: Temp: 99 F (37.2 C) (11/08 0800) Temp Source: Axillary (11/08 0800) BP: 159/69 (11/08 1100) Pulse Rate: 90 (11/08 1100)  Labs: Recent Labs    04/20/20 0434 04/20/20 0434 04/20/20 1332 04/21/20 0555 04/21/20 1205 04/21/20 2013 04/21/20 2013 04/22/20 0544 04/22/20 0939  HGB 10.0*   < >  --  11.8*   < > 11.9*   < > 11.3* 11.9*  HCT 32.9*   < >  --  36.3*   < > 35.0*  --  35.8* 35.0*  PLT 363  --   --  361  --   --   --  334  --   CREATININE 2.44*   < > 3.36* 4.62*  --   --   --  5.73*  --    < > = values in this interval not displayed.    Estimated Creatinine Clearance: 13 mL/min (A) (by C-G formula based on SCr of 5.73 mg/dL (H)).   Medical History: Past Medical History:  Diagnosis Date  . A-fib ([place])    with RVR  . Acute kidney injury ([place])   . Anemia   . Angina   . Arthritis    "hands" (08/10/2017)  . Atrial flutter, paroxysmal ([place])    arrived with aflutter with RVR, EF down to 25-30%, s/p TEE DCCV on 01/29/2015  . Cancer ([place])   . CHF (congestive heart failure) ([place])   . Chronic lower back pain    "all the time from my sciatic nerve"  . Coronary artery disease   . DVT (deep venous thrombosis) ([place])   . Dyspnea   . Erectile dysfunction   . Heart failure ([place])   . Hypercholesteremia   . Hypertension   . 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 Myocardial infarction ([place]) 1988; ~8527;  ~1996;~ 2000  . Obesity   . Peripheral vascular disease ([place])   . Umbilical hernia    unrepaired    Medications:  Medications Prior to Admission  Medication Sig Dispense Refill Last Dose  . amiodarone (PACERONE) 200 MG tablet TAKE 1 TABLET BY MOUTH EVERY DAY (Patient  taking differently: Take 200 mg by mouth daily. ) 90 tablet 3 Past Week at Unknown time  . gabapentin (NEURONTIN) 300 MG capsule Take 300 mg by mouth 2 (two) times daily.    Past Week at Unknown time  . insulin degludec (TRESIBA FLEXTOUCH) 100 UNIT/ML FlexTouch Pen Inject 30 Units into the skin daily.    Past Week at Unknown time  . losartan (COZAAR) 50 MG tablet Take 1 tablet (50 mg total) by mouth daily. 90 tablet 3 Past Week at Unknown time  . metFORMIN (GLUCOPHAGE) 1000 MG tablet Take 1,000 mg by mouth 2 (two) times daily with a meal.   Past Week at Unknown time  . nitroGLYCERIN (NITROSTAT) 0.4 MG SL tablet PLACE 1 TABLET UNDER THE TONGUE EVERY 5 (FIVE) MINUTES AS NEEDED FOR CHEST PAIN (ANGINA). (Patient taking differently: Place 0.4 mg under the tongue every 5 (five) minutes as needed for chest pain. ) 25 tablet 6 unknown  . pantoprazole (PROTONIX) 40 MG tablet TAKE 1 TABLET BY MOUTH EVERY DAY (Patient taking differently: Take 40 mg by mouth daily. ) 90 tablet  1 Past Week at Unknown time  . rosuvastatin (CRESTOR) 40 MG tablet Take 1 tablet (40 mg total) by mouth daily. 90 tablet 3 Past Week at Unknown time    Assessment: 15 YOM who presented with acute R PCA occlusion s/p endovascular treatment. Patient was on Eliquis PTA for h/o Afib which is currently on hold. Pharmacy consulted to start IV heparin WITHOUT bolus for secondary stroke prophylaxis in the setting of AFib.   H/H low stable.  Plt wnl. SCr up to 5.73. No s/s of overt bleeding   Goal of Therapy:  Heparin level 0.3-0.5 units/ml Monitor platelets by anticoagulation protocol: Yes   Plan:  -Start IV heparin at 1200 units/hr. No bolus.  -F/u 8 hr HL -Monitor daily HL, CBC and s/s of bleeding   Albertina Parr, PharmD., BCPS, BCCCP Clinical Pharmacist Please refer to [facility] for unit-specific pharmacist

## 2020-04-22 NOTE — Progress Notes (Signed)
Initial Nutrition Assessment  DOCUMENTATION CODES:   Not applicable  INTERVENTION:   Increase tube feeding via Cortrak tube (once in place): Jevity 1.2  at 75 ml/h (1800 ml per day) Prosource TF 45 ml daily  Provides 2200 kcal, 111 gm protein, 1459 ml free water daily   NUTRITION DIAGNOSIS:   Inadequate oral intake related to inability to eat as evidenced by NPO status.  GOAL:   Patient will meet greater than or equal to 90% of their needs  MONITOR:   TF tolerance, Diet advancement  REASON FOR ASSESSMENT:   New TF    ASSESSMENT:   Pt with PMH of Afib, CHF, DM, CAD, HTN, and esophageal cancer admitted 10/31 with COVID PNA. On 11/5 pt found to have acute R ACA and PCA strokes s/p IR revascularization with TICI 3, s/p balloon angioplasty for severe stenosis.    11/8 extubated  Plan for cortrak placement today.   Medications reviewed and include: colace, SSI, 6 units novolog every 4 hours, 22 units lantus BID, solumedrol, miralax  Labs reviewed: BUN: 144, Cr 5.73 HgbA1C: 7.4 CBG's: 173-174-148  UOP: 850 ml   Current TF: Jevity 1.2 @ 40 ml/hr  Provides: 1152 kcal and 53 grams protein  Diet Order:   Diet Order            Diet NPO time specified  Diet effective now                 EDUCATION NEEDS:   No education needs have been identified at this time  Skin:  Skin Assessment: Reviewed RN Assessment  Last BM:  11/7  Height:   Ht Readings from Last 1 Encounters:  05/09/2020 6' (1.829 m)    Weight:   Wt Readings from Last 1 Encounters:  04/22/20 86.4 kg    Ideal Body Weight:  80.9 kg  BMI:  Body mass index is 25.83 kg/m.  Estimated Nutritional Needs:   Kcal:  2100-2300  Protein:  105-115 grams  Fluid:  > 2 L/day  Lockie Pares., RD, LDN, CNSC See AMiON for contact information '

## 2020-04-22 NOTE — Procedures (Signed)
Extubation Procedure Note  Patient Details:   Name: Justin Blackburn. DOB: 04/29/1951 MRN: [id number]   Airway Documentation:    Vent end date: 04/22/20 Vent end time: 1157   Evaluation  O2 sats: stable throughout Complications: No apparent complications Patient did tolerate procedure well. Bilateral Breath Sounds: Clear, Diminished   No   Prior to extubation pt did have positive cuff leak. Per Dr. Tacy Learn, pt was to be extubated to BiPAP. Pt is unable to tell us his name and unable to clear his secretions. Pt was placed on 10L [place] and maintaining well. Pt tolerated procedure well, no stridor noted, SVS. RT will continue to monitor pt.  Rossie Muskrat R 04/22/2020, 11:59 AM

## 2020-04-22 NOTE — Progress Notes (Signed)
Referring Physician(s): Code stroke- Bhagat, Srishti L (neurology)  Supervising Physician: Luanne Bras  Patient Status:  [facility] - In-pt  Chief Complaint: None- intubated without sedation  Subjective:  History of acute CVA s/p cerebral arteriogram with emergent mechanical thrombectomy of right PCA occlusion achieving a TICI 3 revascularization, along with revascularization of severe left VBJ stenosis using balloon angioplasty via right femoral approach 05/05/2020 by Dr. Estanislado Pandy. Patient laying in bed intubated without sedation. He does not open eyes to voice but does follow simple commands. Can spontaneously move right side with no spontaneous movements of left side. Right femoral puncture site c/d/i.   Allergies: Patient has no known allergies.  Medications: Prior to Admission medications   Medication Sig Start Date End Date Taking? Authorizing Provider  amiodarone (PACERONE) 200 MG tablet TAKE 1 TABLET BY MOUTH EVERY DAY Patient taking differently: Take 200 mg by mouth daily.  03/21/20  Yes Belva Crome, MD  ELIQUIS 5 MG TABS tablet TAKE 1 TABLET BY MOUTH TWICE A DAY 04/23/2020   Belva Crome, MD  gabapentin (NEURONTIN) 300 MG capsule Take 300 mg by mouth 2 (two) times daily.    Yes [provider]  insulin degludec (TRESIBA FLEXTOUCH) 100 UNIT/ML FlexTouch Pen Inject 30 Units into the skin daily.    Yes [provider]  losartan (COZAAR) 50 MG tablet Take 1 tablet (50 mg total) by mouth daily. 03/14/20  Yes Belva Crome, MD  metFORMIN (GLUCOPHAGE) 1000 MG tablet Take 1,000 mg by mouth 2 (two) times daily with a meal. 05/19/18  Yes [provider]  nitroGLYCERIN (NITROSTAT) 0.4 MG SL tablet PLACE 1 TABLET UNDER THE TONGUE EVERY 5 (FIVE) MINUTES AS NEEDED FOR CHEST PAIN (ANGINA). Patient taking differently: Place 0.4 mg under the tongue every 5 (five) minutes as needed for chest pain.  01/23/20  Yes Belva Crome, MD  pantoprazole (PROTONIX) 40 MG  tablet TAKE 1 TABLET BY MOUTH EVERY DAY Patient taking differently: Take 40 mg by mouth daily.  10/23/19  Yes Belva Crome, MD  rosuvastatin (CRESTOR) 40 MG tablet Take 1 tablet (40 mg total) by mouth daily. 04/20/19 05/14/20 Yes Belva Crome, MD     Vital Signs: BP (!) 173/71   Pulse 93   Temp 98.9 F (37.2 C) (Axillary)   Resp (!) 28   Ht 6' (1.829 m)   Wt 196 lb 3.4 oz (89 kg)   SpO2 93%   BMI 26.61 kg/m   Physical Exam Vitals and nursing note reviewed.  Constitutional:      General: He is not in acute distress.    Comments: Intubated without sedation.  Pulmonary:     Effort: Pulmonary effort is normal. No respiratory distress.     Comments: Intubated without sedation. Skin:    General: Skin is warm and dry.     Comments: Right femoral puncture site soft without active bleeding or hematoma.  Neurological:     Comments: Intubated without sedation. He does not open eyes to voice but does follow simple commands. PERRL bilaterally. Can spontaneously move right side (wiggles toes, makes a thumbs up) with no spontaneous movements of left side. Distal pulses (DPs) palpable bilaterally with Doppler.     Imaging: EEG  Result Date: 04/20/2020 Justin Havens, MD     04/20/2020  7:11 PM Patient Name: Justin Blackburn. MRN: [id number] Epilepsy Attending: Lora Blackburn Referring Physician/Provider: Dr Kerney Elbe Date: 04/20/2020 Duration: 26.13 mins Patient history: 69  year old malewithacute right PCA stroke s/p complete revascularization of RT PCA achieving a TICI 3 revascularization,S/P Balloon angioplasty of severe stenosis of Lt VBJ with a 3.28mm x 15 mm OTW balloon achieving a 60-70% patency. EEG to evaluate for seizure. Level of alertness: Lethargic AEDs during EEG study: None Technical aspects: This EEG study was done with scalp electrodes positioned according to the 10-20 International system of electrode placement. Electrical activity was acquired at a sampling rate  of 500Hz  and reviewed with a high frequency filter of 70Hz  and a low frequency filter of 1Hz . EEG data were recorded continuously and digitally stored. Description: No posterior dominant rhythm was seen. EEG showed continuous generalized polymorphic 3 to 6 Hz theta-delta slowing, at times with triphasic morphology. Hyperventilation and photic stimulation were not performed.   ABNORMALITY -Continuous slow, generalized IMPRESSION: This study is suggestive of moderate to severe diffuse encephalopathy, nonspecific etiology. No seizures or epileptiform discharges were seen throughout the recording. Justin Blackburn   CT Code Stroke CTA Head W/WO contrast  Result Date: 05/02/2020 CLINICAL DATA:  Acute neuro deficit.  Left-sided weakness. EXAM: CT ANGIOGRAPHY HEAD AND NECK TECHNIQUE: Multidetector CT imaging of the head and neck was performed using the standard protocol during bolus administration of intravenous contrast. Multiplanar CT image reconstructions and MIPs were obtained to evaluate the vascular anatomy. Carotid stenosis measurements (when applicable) are obtained utilizing NASCET criteria, using the distal internal carotid diameter as the denominator. CONTRAST:  71mL OMNIPAQUE IOHEXOL 350 MG/ML SOLN COMPARISON:  CT head 04/18/2020 FINDINGS: CTA NECK FINDINGS Aortic arch: Atherosclerotic calcification aortic arch without aneurysm or dissection. Atherosclerotic disease in the proximal great vessels without flow limiting stenosis. Right carotid system: Mild atherosclerotic calcification right carotid bifurcation without stenosis. Mild atherosclerotic calcification right cervical internal carotid artery without stenosis. Left carotid system: Atherosclerotic disease left carotid bifurcation. Less than 50% diameter stenosis proximal left internal carotid artery. Moderate stenosis proximal left external carotid artery Vertebral arteries: Moderately severe calcific stenosis origin of right vertebral artery. Moderate  stenosis distal right vertebral artery at the C1 level. Mild atherosclerotic disease origin of left vertebral artery due to atherosclerotic plaque. Moderate to severe stenosis distal left vertebral artery at the skull base. Skeleton: No acute skeletal abnormality. Other neck: Negative for mass or adenopathy in the neck. Upper chest: Diffuse airspace disease in the lung apices bilaterally with mosaic pattern. Possible pulmonary edema or infection. Review of the MIP images confirms the above findings CTA HEAD FINDINGS Anterior circulation: Atherosclerotic calcification throughout the cavernous carotid bilaterally. Mild stenosis on the right and moderate stenosis on the left. A1 segments patent bilaterally. Atherosclerotic disease in the anterior cerebral arteries bilaterally with multiple areas of severe stenosis in the right A2 and A3 segments. Milder disease in the left anterior cerebral artery. Middle cerebral arteries patent bilaterally without flow-limiting stenosis or large vessel occlusion. Posterior circulation: Moderate stenosis distal right vertebral artery and moderate to severe stenosis distal left vertebral artery. Atherosclerotic plaque in the proximal basilar with mild stenosis. Superior cerebellar arteries patent bilaterally. Left posterior cerebral artery patent with mild atherosclerotic stenosis proximally Occlusion of the right posterior cerebral artery at the P1/P2 junction. This appears acute. Venous sinuses: Minimal venous contrast due to arterial phase scanning Anatomic variants: None Review of the MIP images confirms the above findings IMPRESSION: 1. Occlusion right posterior cerebral artery which appears acute. 2. Moderate stenosis distal right vertebral artery and severe stenosis distal left vertebral artery. Moderate stenosis proximal right vertebral artery origin and mild stenosis  origin of left vertebral artery 3. No significant carotid stenosis in the neck. Mild cavernous carotid stenosis  on the right and moderate left cavernous carotid stenosis 4. 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 5. Both middle cerebral arteries are patent without significant stenosis. 6. These results were called by telephone at the time of interpretation on 04/23/2020 at 5:23 pm to provider [facility] , who verbally acknowledged these results. Electronically Signed   By: Franchot Gallo M.D.   On: 04/16/2020 17:25   CT Code Stroke CTA Neck W/WO contrast  Result Date: 05/14/2020 CLINICAL DATA:  Acute neuro deficit.  Left-sided weakness. EXAM: CT ANGIOGRAPHY HEAD AND NECK TECHNIQUE: Multidetector CT imaging of the head and neck was performed using the standard protocol during bolus administration of intravenous contrast. Multiplanar CT image reconstructions and MIPs were obtained to evaluate the vascular anatomy. Carotid stenosis measurements (when applicable) are obtained utilizing NASCET criteria, using the distal internal carotid diameter as the denominator. CONTRAST:  61mL OMNIPAQUE IOHEXOL 350 MG/ML SOLN COMPARISON:  CT head 05/08/2020 FINDINGS: CTA NECK FINDINGS Aortic arch: Atherosclerotic calcification aortic arch without aneurysm or dissection. Atherosclerotic disease in the proximal great vessels without flow limiting stenosis. Right carotid system: Mild atherosclerotic calcification right carotid bifurcation without stenosis. Mild atherosclerotic calcification right cervical internal carotid artery without stenosis. Left carotid system: Atherosclerotic disease left carotid bifurcation. Less than 50% diameter stenosis proximal left internal carotid artery. Moderate stenosis proximal left external carotid artery Vertebral arteries: Moderately severe calcific stenosis origin of right vertebral artery. Moderate stenosis distal right vertebral artery at the C1 level. Mild atherosclerotic disease origin of left vertebral artery due to atherosclerotic plaque. Moderate to severe stenosis  distal left vertebral artery at the skull base. Skeleton: No acute skeletal abnormality. Other neck: Negative for mass or adenopathy in the neck. Upper chest: Diffuse airspace disease in the lung apices bilaterally with mosaic pattern. Possible pulmonary edema or infection. Review of the MIP images confirms the above findings CTA HEAD FINDINGS Anterior circulation: Atherosclerotic calcification throughout the cavernous carotid bilaterally. Mild stenosis on the right and moderate stenosis on the left. A1 segments patent bilaterally. Atherosclerotic disease in the anterior cerebral arteries bilaterally with multiple areas of severe stenosis in the right A2 and A3 segments. Milder disease in the left anterior cerebral artery. Middle cerebral arteries patent bilaterally without flow-limiting stenosis or large vessel occlusion. Posterior circulation: Moderate stenosis distal right vertebral artery and moderate to severe stenosis distal left vertebral artery. Atherosclerotic plaque in the proximal basilar with mild stenosis. Superior cerebellar arteries patent bilaterally. Left posterior cerebral artery patent with mild atherosclerotic stenosis proximally Occlusion of the right posterior cerebral artery at the P1/P2 junction. This appears acute. Venous sinuses: Minimal venous contrast due to arterial phase scanning Anatomic variants: None Review of the MIP images confirms the above findings IMPRESSION: 1. Occlusion right posterior cerebral artery which appears acute. 2. Moderate stenosis distal right vertebral artery and severe stenosis distal left vertebral artery. Moderate stenosis proximal right vertebral artery origin and mild stenosis origin of left vertebral artery 3. No significant carotid stenosis in the neck. Mild cavernous carotid stenosis on the right and moderate left cavernous carotid stenosis 4. 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 5. Both middle cerebral arteries are patent  without significant stenosis. 6. These results were called by telephone at the time of interpretation on 05/14/2020 at 5:23 pm to provider [facility] , who verbally acknowledged these results. Electronically Signed   By: Franchot Gallo  M.D.   On: 05/06/2020 17:25   MR ANGIO HEAD WO CONTRAST  Result Date: 04/20/2020 CLINICAL DATA:  Follow-up examination for acute stroke. EXAM: MRI HEAD WITHOUT CONTRAST MRA HEAD WITHOUT CONTRAST TECHNIQUE: Multiplanar, multiecho pulse sequences of the brain and surrounding structures were obtained without intravenous contrast. Angiographic images of the head were obtained using MRA technique without contrast. COMPARISON:  Prior CTs from 05/07/2020. FINDINGS: MRI HEAD FINDINGS Brain: Generalized age-related cerebral atrophy. Patchy T2/FLAIR hyperintensity within the periventricular and deep white matter both cerebral hemispheres most consistent with chronic small vessel ischemic disease, moderate in nature. Patchy restricted diffusion involving the parasagittal right frontal and parietal lobes, consistent with an acute ischemic right ACA territory infarct. Additional patchy diffusion abnormality seen involving the parasagittal right occipital lobe and right thalamus, consistent with right PCA territory infarct as well. No associated mass effect. Scattered areas of associated petechial hemorrhage without hemorrhagic transformation. No other evidence for acute or subacute ischemia. Gray-white matter differentiation otherwise maintained. No other areas of chronic cortical infarction. No other evidence for acute or chronic intracranial hemorrhage. No mass lesion, midline shift or mass effect. No hydrocephalus or extra-axial fluid collection. Partially empty sella noted. Midline structures intact. Vascular: Major intracranial vascular flow voids are maintained. Skull and upper cervical spine: Craniocervical junction within normal limits. Bone marrow signal intensity normal. No scalp  soft tissue abnormality. Sinuses/Orbits: Patient status post bilateral ocular lens replacement. Globes and orbital soft tissues demonstrate no acute finding. Scattered mucosal thickening noted throughout the paranasal sinuses. Fluid seen within the nasopharynx. Patient is intubated. Other: None. MRA HEAD FINDINGS ANTERIOR CIRCULATION: Visualized distal cervical segments of the internal carotid arteries are patent with antegrade flow. Petrous segments widely patent. Scattered atheromatous irregularity throughout the carotid siphons bilaterally, with associated moderate approximate 50% stenosis at the cavernous left ICA (series 10, image 97). No more than mild stenosis on the right. A1 segments patent bilaterally. Normal anterior communicating artery. Extensive atheromatous disease seen involving the anterior cerebral arteries bilaterally, right worse than left, with severe multifocal right A2 and A3 stenoses. The right ACA is markedly attenuated and irregular but remains patent. More mild disease seen on the left. M1 segments widely patent. Normal MCA bifurcations. Distal MCA branches well perfused and symmetric. POSTERIOR CIRCULATION: Atheromatous irregularity with moderate diffuse stenosis at the proximal left V4 segment somewhat linear filling defect seen within the left V4 segment on vascular MIP reconstructions, felt to be due to underlying irregular atherosclerotic disease. No visible dissection seen within this region on prior CTA. Focal severe distal right V4 stenosis noted just prior to the vertebrobasilar junction. Mild atheromatous irregularity about the proximal basilar artery with no more than mild stenosis. Basilar otherwise patent to its distal aspect. Superior cerebral arteries patent bilaterally. Both PCAs primarily supplied via the basilar. Left PCA patent with mild multifocal left P2 stenoses. Previously seen proximal right PCA occlusion has been corrected, with the right PCA now patent to its  distal aspect. However, severe atheromatous disease seen throughout the right P2 and distal PCA branches with associated arterial attenuation. No aneurysm. IMPRESSION: MRI HEAD IMPRESSION: 1. 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 Associated petechial hemorrhage without hemorrhagic transformation or significant mass effect. 2. Underlying age-related cerebral atrophy with moderate chronic small vessel ischemic disease. MRA HEAD IMPRESSION: 1. Interval correction/resolution of previously seen proximal right PCA occlusion. Severe atheromatous disease seen throughout the underlying right P2 and distal right PCA branches. 2. Extensive atheromatous disease involving the anterior cerebral arteries  bilaterally, right worse than left, with severe multifocal right A2 and A3 stenoses. 3. Moderate approximate 50% stenosis involving the cavernous left ICA. 4. Moderate to severe bilateral V4 stenoses as above. Electronically Signed   By: Jeannine Boga M.D.   On: 04/20/2020 01:56   MR BRAIN WO CONTRAST  Result Date: 04/20/2020 CLINICAL DATA:  Follow-up examination for acute stroke. EXAM: MRI HEAD WITHOUT CONTRAST MRA HEAD WITHOUT CONTRAST TECHNIQUE: Multiplanar, multiecho pulse sequences of the brain and surrounding structures were obtained without intravenous contrast. Angiographic images of the head were obtained using MRA technique without contrast. COMPARISON:  Prior CTs from 05/04/2020. FINDINGS: MRI HEAD FINDINGS Brain: Generalized age-related cerebral atrophy. Patchy T2/FLAIR hyperintensity within the periventricular and deep white matter both cerebral hemispheres most consistent with chronic small vessel ischemic disease, moderate in nature. Patchy restricted diffusion involving the parasagittal right frontal and parietal lobes, consistent with an acute ischemic right ACA territory infarct. Additional patchy diffusion abnormality seen involving the parasagittal right occipital lobe and  right thalamus, consistent with right PCA territory infarct as well. No associated mass effect. Scattered areas of associated petechial hemorrhage without hemorrhagic transformation. No other evidence for acute or subacute ischemia. Gray-white matter differentiation otherwise maintained. No other areas of chronic cortical infarction. No other evidence for acute or chronic intracranial hemorrhage. No mass lesion, midline shift or mass effect. No hydrocephalus or extra-axial fluid collection. Partially empty sella noted. Midline structures intact. Vascular: Major intracranial vascular flow voids are maintained. Skull and upper cervical spine: Craniocervical junction within normal limits. Bone marrow signal intensity normal. No scalp soft tissue abnormality. Sinuses/Orbits: Patient status post bilateral ocular lens replacement. Globes and orbital soft tissues demonstrate no acute finding. Scattered mucosal thickening noted throughout the paranasal sinuses. Fluid seen within the nasopharynx. Patient is intubated. Other: None. MRA HEAD FINDINGS ANTERIOR CIRCULATION: Visualized distal cervical segments of the internal carotid arteries are patent with antegrade flow. Petrous segments widely patent. Scattered atheromatous irregularity throughout the carotid siphons bilaterally, with associated moderate approximate 50% stenosis at the cavernous left ICA (series 10, image 97). No more than mild stenosis on the right. A1 segments patent bilaterally. Normal anterior communicating artery. Extensive atheromatous disease seen involving the anterior cerebral arteries bilaterally, right worse than left, with severe multifocal right A2 and A3 stenoses. The right ACA is markedly attenuated and irregular but remains patent. More mild disease seen on the left. M1 segments widely patent. Normal MCA bifurcations. Distal MCA branches well perfused and symmetric. POSTERIOR CIRCULATION: Atheromatous irregularity with moderate diffuse  stenosis at the proximal left V4 segment somewhat linear filling defect seen within the left V4 segment on vascular MIP reconstructions, felt to be due to underlying irregular atherosclerotic disease. No visible dissection seen within this region on prior CTA. Focal severe distal right V4 stenosis noted just prior to the vertebrobasilar junction. Mild atheromatous irregularity about the proximal basilar artery with no more than mild stenosis. Basilar otherwise patent to its distal aspect. Superior cerebral arteries patent bilaterally. Both PCAs primarily supplied via the basilar. Left PCA patent with mild multifocal left P2 stenoses. Previously seen proximal right PCA occlusion has been corrected, with the right PCA now patent to its distal aspect. However, severe atheromatous disease seen throughout the right P2 and distal PCA branches with associated arterial attenuation. No aneurysm. IMPRESSION: MRI HEAD IMPRESSION: 1. 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 Associated petechial hemorrhage without hemorrhagic transformation or significant mass effect. 2. Underlying age-related cerebral atrophy with moderate chronic small  vessel ischemic disease. MRA HEAD IMPRESSION: 1. Interval correction/resolution of previously seen proximal right PCA occlusion. Severe atheromatous disease seen throughout the underlying right P2 and distal right PCA branches. 2. Extensive atheromatous disease involving the anterior cerebral arteries bilaterally, right worse than left, with severe multifocal right A2 and A3 stenoses. 3. Moderate approximate 50% stenosis involving the cavernous left ICA. 4. Moderate to severe bilateral V4 stenoses as above. Electronically Signed   By: Jeannine Boga M.D.   On: 04/20/2020 01:56   DG Chest Port 1V same Day  Result Date: 05/12/2020 CLINICAL DATA:  Endotracheal tube present.  OG tube. EXAM: PORTABLE CHEST 1 VIEW COMPARISON:  Chest radiograph yesterday. FINDINGS:  Endotracheal tube tip is at the level of the clavicular heads. Enteric tube is in place with tip below the diaphragm in the stomach, side-port tentatively visualized in the lower esophagus. Low lung volumes with patchy and heterogeneous bilateral basilar predominant airspace disease. Fine interstitial opacities in the upper lung zones. Post median sternotomy with cardiomegaly. No pneumothorax. No large pleural effusion. IMPRESSION: 1. Endotracheal tube tip at the level of the clavicular heads. Enteric tube with tip below the diaphragm in the stomach, side-port tentatively visualized in the lower esophagus. Advancement of 5 cm would place the side-port below the diaphragm. 2. Low lung volumes with patchy bibasilar predominant airspace disease, pneumonia versus edema. Electronically Signed   By: Keith Rake M.D.   On: 04/23/2020 22:48   DG Abd Portable 1V  Result Date: 04/20/2020 CLINICAL DATA:  OG tube placement EXAM: PORTABLE ABDOMEN - 1 VIEW COMPARISON:  04/25/2020 FINDINGS: NG tube is in place within the stomach. Prior cholecystectomy. Nonobstructive bowel gas pattern. IMPRESSION: NG tube in the stomach. Electronically Signed   By: Rolm Baptise M.D.   On: 04/20/2020 02:22   DG Abd Portable 1V  Result Date: 04/29/2020 CLINICAL DATA:  NG tube placement. EXAM: PORTABLE ABDOMEN - 1 VIEW COMPARISON:  Abdominal CT 04/01/2020 FINDINGS: Tip of the enteric tube is below the diaphragm in the stomach, side-port in the region of the distal esophagus. Recommend advancement of least 7 cm for optimal placement. Normal bowel gas pattern with moderate volume of colonic stool. There is excreted contrast in the urinary bladder. Surgical clips in the right upper quadrant. IMPRESSION: Tip of the enteric tube below the diaphragm in the stomach, side-port in the region of the distal esophagus. Recommend advancement of least 7 cm for optimal placement. Electronically Signed   By: Keith Rake M.D.   On: 04/23/2020 22:50     ECHOCARDIOGRAM COMPLETE  Result Date: 04/20/2020    ECHOCARDIOGRAM REPORT   Patient Name:   Justin Blackburn. Date of Exam: 04/20/2020 Medical Rec #:  [id number]         Height:       72.0 in Accession #:    [id number]        Weight:       196.2 lb Date of Birth:  [birth year]          BSA:          2.113 m Patient Age:    69 years          BP:           131/62 mmHg Patient Gender: M                 HR:           80 bpm. Exam Location:  Inpatient Procedure: 2D Echo Indications:  stroke 434.91  History:        Patient has prior history of Echocardiogram examinations, most                 recent 01/23/2020. CHF, CAD and Previous Myocardial Infarction,                 Prior CABG, Arrythmias:Atrial Fibrillation and Atrial Flutter;                 Risk Factors:Hypertension, Dyslipidemia and Former Smoker. Covid                 +.  Sonographer:    Jannett Celestine RDCS (AE) Referring Phys: [id number] Lorenza Chick  Sonographer Comments: Echo performed with patient supine and on artificial respirator. Image acquisition challenging due to respiratory motion. suboptimal windows due to restricted mobility IMPRESSIONS  1. Left ventricular ejection fraction, by estimation, is 65 to 70%. The left ventricle has normal function. The left ventricle has no regional wall motion abnormalities. Left ventricular diastolic parameters are consistent with Grade I diastolic dysfunction (impaired relaxation).  2. Right ventricular systolic function is moderately reduced. The right ventricular size is moderately enlarged.  3. Right atrial size was moderately dilated.  4. The mitral valve is normal in structure. No evidence of mitral valve regurgitation. No evidence of mitral stenosis.  5. The aortic valve is normal in structure. Aortic valve regurgitation is not visualized. No aortic stenosis is present.  6. The inferior vena cava is normal in size with greater than 50% respiratory variability, suggesting right atrial pressure of 3 mmHg.  Conclusion(s)/Recommendation(s): No intracardiac source of embolism detected on this transthoracic study. A transesophageal echocardiogram is recommended to exclude cardiac source of embolism if clinically indicated. FINDINGS  Left Ventricle: Left ventricular ejection fraction, by estimation, is 65 to 70%. The left ventricle has normal function. The left ventricle has no regional wall motion abnormalities. The left ventricular internal cavity size was normal in size. There is  no left ventricular hypertrophy. Left ventricular diastolic parameters are consistent with Grade I diastolic dysfunction (impaired relaxation). Right Ventricle: The right ventricular size is moderately enlarged. No increase in right ventricular wall thickness. Right ventricular systolic function is moderately reduced. Left Atrium: Left atrial size was normal in size. Right Atrium: Right atrial size was moderately dilated. Pericardium: There is no evidence of pericardial effusion. Mitral Valve: The mitral valve is normal in structure. No evidence of mitral valve regurgitation. No evidence of mitral valve stenosis. Tricuspid Valve: The tricuspid valve is normal in structure. Tricuspid valve regurgitation is not demonstrated. No evidence of tricuspid stenosis. Aortic Valve: The aortic valve is normal in structure. Aortic valve regurgitation is not visualized. No aortic stenosis is present. Pulmonic Valve: The pulmonic valve was normal in structure. Pulmonic valve regurgitation is not visualized. No evidence of pulmonic stenosis. Aorta: The aortic root is normal in size and structure. Venous: The inferior vena cava is normal in size with greater than 50% respiratory variability, suggesting right atrial pressure of 3 mmHg. IAS/Shunts: No atrial level shunt detected by color flow Doppler.  AORTIC VALVE LVOT Vmax:   92.50 cm/s LVOT Vmean:  63.200 cm/s LVOT VTI:    0.152 m MITRAL VALVE MV Area (PHT): 2.37 cm     SHUNTS MV Decel Time: 320 msec      Systemic VTI: 0.15 m MV E velocity: 84.80 cm/s MV A velocity: 141.00 cm/s MV E/A ratio:  0.60 Ena Dawley MD Electronically signed by Ena Dawley  MD Signature Date/Time: 04/20/2020/4:10:20 PM    Final    CT HEAD CODE STROKE WO CONTRAST  Result Date: 04/25/2020 CLINICAL DATA:  Code stroke. 69 year old male with acute left side weakness. On Eliquis. EXAM: CT HEAD WITHOUT CONTRAST TECHNIQUE: Contiguous axial images were obtained from the base of the skull through the vertex without intravenous contrast. COMPARISON:  Head CT 03/02/2020. FINDINGS: Brain: There is a small area of lost gray-white matter differentiation at the right superior motor and pre motor area in the midline corresponding to distal right ACA territory (series 3, image 28). Associated cortical hypodensity. No hemorrhage or mass effect. Patchy bilateral white matter hypodensity elsewhere is stable. No other changes of acute cortically based infarct identified. Possible confluent hypodensity in the lower pons is stable (versus recurrent CT artifact on series 3, image 10). Stable ventricle size and configuration. No midline shift, ventriculomegaly, mass effect, evidence of mass lesion, intracranial hemorrhage. Vascular: Calcified atherosclerosis at the skull base. No suspicious intracranial vascular hyperdensity. Skull: Stable.  No acute osseous abnormality identified. Sinuses/Orbits: Improved paranasal sinus aeration since September. Continued right maxillary mucosal thickening and small left maxillary fluid level. Continued bubbly opacity in the left frontal sinus. Tympanic cavities and mastoids remain clear. Other: No acute orbit or scalp soft tissue finding. ASPECTS [facility] Stroke Program Early CT Score) Total score (0-10 with 10 being normal): 9 (abnormal right M5 segment) IMPRESSION: 1. 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 No associated hemorrhage or mass effect. ASPECTS 9. 2. Otherwise stable compared to  September. 3. These results were communicated to Dr. Curly Shores at [place] pm on 04/18/2020 by text page via the [facility] messaging system. Electronically Signed   By: Genevie Ann M.D.   On: 04/18/2020 17:03   VAS [place] LOWER EXTREMITY VENOUS (DVT)  Result Date: 04/20/2020  Lower Venous DVT Study Indications: Covid-19, elevated D-Dimer.  Limitations: Ventilation, shadowing from arterial plaque. Comparison Study: prior positive left lower extremity venous duplex done                   12/07/2017, is available for comparison Performing Technologist: Sharion Dove RVS  Examination Guidelines: A complete evaluation includes B-mode imaging, spectral Doppler, color Doppler, and power Doppler as needed of all accessible portions of each vessel. Bilateral testing is considered an integral part of a complete examination. Limited examinations for reoccurring indications may be performed as noted. The reflux portion of the exam is performed with the patient in reverse Trendelenburg.  +---------+---------------+---------+-----------+----------+--------------+ RIGHT    CompressibilityPhasicitySpontaneityPropertiesThrombus Aging +---------+---------------+---------+-----------+----------+--------------+ CFV      Full           Yes      Yes                                 +---------+---------------+---------+-----------+----------+--------------+ SFJ      Full                                                        +---------+---------------+---------+-----------+----------+--------------+ FV Prox  Full                                                        +---------+---------------+---------+-----------+----------+--------------+  FV Mid   Full                                                        +---------+---------------+---------+-----------+----------+--------------+ FV DistalFull                                                         +---------+---------------+---------+-----------+----------+--------------+ PFV      Full                                                        +---------+---------------+---------+-----------+----------+--------------+ POP      Full           Yes      Yes                                 +---------+---------------+---------+-----------+----------+--------------+ PTV      Full                                                        +---------+---------------+---------+-----------+----------+--------------+ PERO     Full                                                        +---------+---------------+---------+-----------+----------+--------------+   +---------+---------------+---------+-----------+----------+--------------+ LEFT     CompressibilityPhasicitySpontaneityPropertiesThrombus Aging +---------+---------------+---------+-----------+----------+--------------+ CFV      Full           Yes      Yes                                 +---------+---------------+---------+-----------+----------+--------------+ SFJ      Full                                                        +---------+---------------+---------+-----------+----------+--------------+ FV Prox  Full                                                        +---------+---------------+---------+-----------+----------+--------------+ FV Mid   Full                                                        +---------+---------------+---------+-----------+----------+--------------+  FV DistalFull                                                        +---------+---------------+---------+-----------+----------+--------------+ PFV      Full                                                        +---------+---------------+---------+-----------+----------+--------------+ POP      Full           Yes      Yes                                  +---------+---------------+---------+-----------+----------+--------------+ PTV      Full                                                        +---------+---------------+---------+-----------+----------+--------------+ PERO     Full                                                        +---------+---------------+---------+-----------+----------+--------------+     Summary: RIGHT: - There is no evidence of deep vein thrombosis in the lower extremity.  LEFT: - Findings appear improved from previous examination. - There is no evidence of deep vein thrombosis in the lower extremity.  *See table(s) above for measurements and observations. Electronically signed by Servando Snare MD on 04/20/2020 at 3:29:54 PM.    Final     Labs:  CBC: Recent Labs    05/07/2020 0024 05/04/2020 2151 04/20/20 0434 04/20/20 0434 04/21/20 0555 04/21/20 0555 04/21/20 1205 04/21/20 2013 04/22/20 0544 04/22/20 0939  WBC 14.2*  --  18.6*  --  19.7*  --   --   --  18.9*  --   HGB 13.7   < > 10.0*   < > 11.8*   < > 11.6* 11.9* 11.3* 11.9*  HCT 42.8   < > 32.9*   < > 36.3*   < > 34.0* 35.0* 35.8* 35.0*  PLT 397  --  363  --  361  --   --   --  334  --    < > = values in this interval not displayed.    COAGS: Recent Labs    04/13/2020 2119  INR 1.4*  APTT 37*    BMP: Recent Labs    10/05/19 1139 10/05/19 1139 03/02/20 1258 03/02/20 1258 03/14/20 0940 03/22/2020 2100 04/20/20 0434 04/20/20 0434 04/20/20 1332 04/20/20 1332 04/21/20 0555 04/21/20 0555 04/21/20 1205 04/21/20 2013 04/22/20 0544 04/22/20 0939  NA 137   < > 139  --  142   < > 142   < > 139   < > 142   < > 143 143 143 144  K 4.2   < >  4.6   < > 4.6   < > 4.5   < > 4.9   < > 4.3   < > 4.4 4.7 4.7 4.7  CL 103   < > 107   < > 105   < > 112*  --  108  --  110  --   --   --  110  --   CO2 25   < > 18*   < > 24   < > 17*  --  19*  --  19*  --   --   --  20*  --   GLUCOSE 270*   < > 84  --  89   < > 250*  --  261*  --  206*  --   --   --   188*  --   BUN 29*   < > 34*  --  33*   < > 70*  --  87*  --  110*  --   --   --  144*  --   CALCIUM 9.1   < > 8.6*   < > 9.6   < > 6.8*  --  7.7*  --  8.2*  --   --   --  8.2*  --   CREATININE 2.53*  --  2.41*   < > 3.11*   < > 2.44*  --  3.36*  --  4.62*  --   --   --  5.73*  --   GFRNONAA 25*  --  26*   < > 19*   < > 28*  --  19*  --  13*  --   --   --  10*  --   GFRAA 28*  --  30*  --  22*  --   --   --   --   --   --   --   --   --   --   --    < > = values in this interval not displayed.    LIVER FUNCTION TESTS: Recent Labs    05/09/2020 0024 04/20/20 0434 04/21/20 0555 04/22/20 0544  BILITOT 0.7 0.7 0.6 0.4  AST 20 21 20 22   ALT 17 14 11 8   ALKPHOS 93 66 77 79  PROT 6.4* 4.6* 5.0* 5.0*  ALBUMIN 2.7* 2.0* 2.4* 2.3*    Assessment and Plan:  History of acute CVA s/p cerebral arteriogram with emergent mechanical thrombectomy of right PCA occlusion achieving a TICI 3 revascularization, along with revascularization of severe left VBJ stenosis using balloon angioplasty via right femoral approach 04/26/2020 by Dr. Estanislado Pandy. Patient's condition stable- remains intubated without sedation, does not open eyes to voice but follows simple commands, can spontaneously move right side with no spontaneous movements of left side. Right femoral puncture site stable, distal pulses (DPs) palpable bilaterally with Doppler. Further plans per neurology/CCM- appreciate and agree with management. Please call NIR with questions/concerns.   Electronically Signed: Earley Abide, PA-C 04/22/2020, 10:33 AM   I spent a total of 25 Minutes at the the patient's bedside AND on the patient's hospital floor or unit, greater than 50% of which was counseling/coordinating care for CVA s/p revascularization.

## 2020-04-22 NOTE — Progress Notes (Addendum)
Inpatient Diabetes Program Recommendations  AACE/ADA: New Consensus Statement on Inpatient Glycemic Control (2015)  Target Ranges:  Prepandial:   less than 140 mg/dL      Peak postprandial:   less than 180 mg/dL (1-2 hours)      Critically ill patients:  140 - 180 mg/dL   Lab Results  Component Value Date   GLUCAP 174 (H) 04/22/2020   HGBA1C 7.4 (H) 04/20/2020    Review of Glycemic Control Results for Justin Blackburn, Justin Blackburn (MRN [id number]) as of 04/22/2020 10:24  Ref. Range 04/21/2020 21:14 04/21/2020 23:04 04/22/2020 05:26 04/22/2020 08:50  Glucose-Capillary Latest Ref Range: 70 - 99 mg/dL 222 (H) 226 (H) 173 (H) 174 (H)   Diabetes history: Type 2 DM Outpatient Diabetes medications: Tresiba 30 units QD, Metformin 1000 mg BID Current orders for Inpatient glycemic control: Novolog 6 units Q4H, Lantus 20 units BID, Novolog 0-15 units Q4H Jevity 1.2 cal @ 40 ml to increase to 75 ml/hr today  Inpatient Diabetes Program Recommendations:    Consider increasing Lantus to 22 units BID.   Addendum: Secure chat received from RD regarding increase to Jevity; much appreciated. Given current trends will follow for further recommendations.   Thanks, Bronson Curb, MSN, RNC-OB Diabetes Coordinator [phone] (8a-5p)

## 2020-04-22 NOTE — Discharge Instructions (Signed)
Femoral Site Care This sheet gives you information about how to care for yourself after your procedure. Your health care provider may also give you more specific instructions. If you have problems or questions, contact your health care provider. What can I expect after the procedure? After the procedure, it is common to have:  Bruising that usually fades within 1-2 weeks.  Tenderness at the site. Follow these instructions at home: Wound care 1. Follow instructions from your health care provider about how to take care of your insertion site. Make sure you: ? Wash your hands with soap and water before you change your bandage (dressing). If soap and water are not available, use hand sanitizer. ? Change your dressing as directed- pressure dressing removed 24 hours post-procedure (and switch for bandaid), bandaid removed 72 hours post-procedure 2. Do not take baths, swim, or use a hot tub for 7 days post-procedure. 3. You may shower 48 hours after the procedure or as told by your health care provider. ? Gently wash the site with plain soap and water. ? Pat the area dry with a clean towel. ? Do not rub the site. This may cause bleeding. 4. Check your site every day for signs of infection. Check for: ? Redness, swelling, or pain. ? Fluid or blood. ? Warmth. ? Pus or a bad smell. Activity  Do not stoop, bend, or lift anything that is heavier than 10 lb (4.5 kg) for 2 weeks post-procedure.  Do not drive self for 2 weeks post-procedure. Contact a health care provider if you have:  A fever or chills.  You have redness, swelling, or pain around your insertion site. Get help right away if:  The catheter insertion area swells very fast.  You pass out.  You suddenly start to sweat or your skin gets clammy.  The catheter insertion area is bleeding, and the bleeding does not stop when you hold steady pressure on the area.  The area near or just beyond the catheter insertion site becomes  pale, cool, tingly, or numb. These symptoms may represent a serious problem that is an emergency. Do not wait to see if the symptoms will go away. Get medical help right away. Call your local emergency services (911 in the [place] Do not drive yourself to the hospital.  This information is not intended to replace advice given to you by your health care provider. Make sure you discuss any questions you have with your health care provider. Document Revised: 06/14/2017 Document Reviewed: 06/14/2017 Elsevier Patient Education  2020 Elsevier Inc. 

## 2020-04-23 DIAGNOSIS — I639 Cerebral infarction, unspecified: Secondary | ICD-10-CM | POA: Diagnosis not present

## 2020-04-23 LAB — GLUCOSE, CAPILLARY
Glucose-Capillary: 172 mg/dL — ABNORMAL HIGH (ref 70–99)
Glucose-Capillary: 236 mg/dL — ABNORMAL HIGH (ref 70–99)
Glucose-Capillary: 258 mg/dL — ABNORMAL HIGH (ref 70–99)
Glucose-Capillary: 267 mg/dL — ABNORMAL HIGH (ref 70–99)
Glucose-Capillary: 303 mg/dL — ABNORMAL HIGH (ref 70–99)
Glucose-Capillary: 306 mg/dL — ABNORMAL HIGH (ref 70–99)

## 2020-04-23 LAB — COMPREHENSIVE METABOLIC PANEL
ALT: 10 U/L (ref 0–44)
AST: 29 U/L (ref 15–41)
Albumin: 2.2 g/dL — ABNORMAL LOW (ref 3.5–5.0)
Alkaline Phosphatase: 72 U/L (ref 38–126)
Anion gap: 13 (ref 5–15)
BUN: 173 mg/dL — ABNORMAL HIGH (ref 8–23)
CO2: 19 mmol/L — ABNORMAL LOW (ref 22–32)
Calcium: 8.2 mg/dL — ABNORMAL LOW (ref 8.9–10.3)
Chloride: 114 mmol/L — ABNORMAL HIGH (ref 98–111)
Creatinine, Ser: 5.86 mg/dL — ABNORMAL HIGH (ref 0.61–1.24)
GFR, Estimated: 10 mL/min — ABNORMAL LOW (ref >60)
Glucose, Bld: 295 mg/dL — ABNORMAL HIGH (ref 70–99)
Potassium: 5 mmol/L (ref 3.5–5.1)
Sodium: 146 mmol/L — ABNORMAL HIGH (ref 135–145)
Total Bilirubin: 0.5 mg/dL (ref 0.3–1.2)
Total Protein: 4.8 g/dL — ABNORMAL LOW (ref 6.5–8.1)

## 2020-04-23 LAB — MAGNESIUM
Magnesium: 3 mg/dL — ABNORMAL HIGH (ref 1.7–2.4)
Magnesium: 2.9 mg/dL — ABNORMAL HIGH (ref 1.7–2.4)

## 2020-04-23 LAB — CBC
HCT: 33.9 % — ABNORMAL LOW (ref 39.0–52.0)
Hemoglobin: 10.8 g/dL — ABNORMAL LOW (ref 13.0–17.0)
MCH: 26.3 pg (ref 26.0–34.0)
MCHC: 31.9 g/dL (ref 30.0–36.0)
MCV: 82.7 fL (ref 80.0–100.0)
Platelets: 302 10*3/uL (ref 150–400)
RBC: 4.1 MIL/uL — ABNORMAL LOW (ref 4.22–5.81)
RDW: 19.1 % — ABNORMAL HIGH (ref 11.5–15.5)
WBC: 20.8 10*3/uL — ABNORMAL HIGH (ref 4.0–10.5)
nRBC: 0.1 % (ref 0.0–0.2)

## 2020-04-23 LAB — PHOSPHORUS
Phosphorus: 5.8 mg/dL — ABNORMAL HIGH (ref 2.5–4.6)
Phosphorus: 5.1 mg/dL — ABNORMAL HIGH (ref 2.5–4.6)

## 2020-04-23 LAB — APTT
aPTT: 91 seconds — ABNORMAL HIGH (ref 24–36)
aPTT: 57 seconds — ABNORMAL HIGH (ref 24–36)

## 2020-04-23 LAB — HEPARIN LEVEL (UNFRACTIONATED): Heparin Unfractionated: >2.2 IU/mL — ABNORMAL HIGH (ref 0.30–0.70)

## 2020-04-23 LAB — TRIGLYCERIDES: Triglycerides: 92 mg/dL (ref <150)

## 2020-04-23 MED ORDER — SODIUM CHLORIDE 0.45 % IV SOLN: INTRAVENOUS | Status: DC

## 2020-04-23 MED ORDER — FREE WATER: 400.0000 mL | Freq: Four times a day (QID) | Status: DC

## 2020-04-23 MED ORDER — INSULIN ASPART 100 UNIT/ML ~~LOC~~ SOLN
10.0000 [IU] | SUBCUTANEOUS | Status: DC
  Administered 2020-04-23 – 2020-04-24 (×7): 10 [IU] via SUBCUTANEOUS

## 2020-04-23 MED ORDER — INSULIN GLARGINE 100 UNIT/ML ~~LOC~~ SOLN
25.0000 [IU] | Freq: Two times a day (BID) | SUBCUTANEOUS | Status: DC
  Administered 2020-04-23 (×2): 25 [IU] via SUBCUTANEOUS
  Filled 2020-04-23 (×4): qty 0.25

## 2020-04-23 MED ORDER — FREE WATER
300.0000 mL | Freq: Four times a day (QID) | Status: DC
  Administered 2020-04-23 – 2020-04-24 (×5): 300 mL

## 2020-04-23 MED ORDER — ORAL CARE MOUTH RINSE
15.0000 mL | Freq: Two times a day (BID) | OROMUCOSAL | Status: DC
  Administered 2020-04-23 – 2020-04-24 (×3): 15 mL via OROMUCOSAL

## 2020-04-23 MED ORDER — CHLORHEXIDINE GLUCONATE 0.12 % MT SOLN
15.0000 mL | Freq: Two times a day (BID) | OROMUCOSAL | Status: DC
  Administered 2020-04-23 – 2020-04-24 (×2): 15 mL via OROMUCOSAL
  Filled 2020-04-23: qty 15

## 2020-04-23 MED ORDER — ASPIRIN 81 MG PO CHEW
81.0000 mg | CHEWABLE_TABLET | Freq: Every day | ORAL | Status: DC
  Administered 2020-04-23 – 2020-04-24 (×2): 81 mg
  Filled 2020-04-23 (×2): qty 1

## 2020-04-23 NOTE — Progress Notes (Signed)
ANTICOAGULATION CONSULT NOTE  Pharmacy Consult for heparin Indication: atrial fibrillation  No Known Allergies  Patient Measurements: Height: 6' (182.9 cm) Weight: 90.1 kg (198 lb 10.2 oz) IBW/kg (Calculated) : 77.6 Heparin Dosing Weight: 86 kg   Vital Signs: Temp: 97.6 F (36.4 C) (11/09 0800) Temp Source: Axillary (11/09 0800) BP: 138/66 (11/09 1900) Pulse Rate: 77 (11/09 1900)  Labs: Recent Labs    04/21/20 0555 04/21/20 1205 04/22/20 0544 04/22/20 0544 04/22/20 0939 04/22/20 1811 04/22/20 2016 04/23/20 0547 04/23/20 0745 04/23/20 1751  HGB 11.8*   < > 11.3*   < > 11.9*  --   --  10.8*  --   --   HCT 36.3*   < > 35.8*  --  35.0*  --   --  33.9*  --   --   PLT 361  --  334  --   --   --   --  302  --   --   APTT  --   --   --   --   --   --  122*  --  91* 57*  HEPARINUNFRC  --   --   --   --   --  >2.20* >2.20* >2.20*  --   --   CREATININE 4.62*  --  5.73*  --   --   --   --  5.86*  --   --    < > = values in this interval not displayed.    Estimated Creatinine Clearance: 12.7 mL/min (A) (by C-G formula based on SCr of 5.86 mg/dL (H)).   Medical History: Past Medical History:  Diagnosis Date  . A-fib ([place])    with RVR  . Acute kidney injury ([place])   . Anemia   . Angina   . Arthritis    "hands" (08/10/2017)  . Atrial flutter, paroxysmal ([place])    arrived with aflutter with RVR, EF down to 25-30%, s/p TEE DCCV on 01/29/2015  . Cancer ([place])   . CHF (congestive heart failure) ([place])   . Chronic lower back pain    "all the time from my sciatic nerve"  . Coronary artery disease   . DVT (deep venous thrombosis) ([place])   . Dyspnea   . Erectile dysfunction   . Heart failure ([place])   . Hypercholesteremia   . Hypertension   . 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 Myocardial infarction ([place]) 1988; ~2202;  ~1996;~ 2000  . Obesity   . Peripheral vascular disease ([place])   . Umbilical hernia    unrepaired    Medications:  Medications Prior  to Admission  Medication Sig Dispense Refill Last Dose  . amiodarone (PACERONE) 200 MG tablet TAKE 1 TABLET BY MOUTH EVERY DAY (Patient taking differently: Take 200 mg by mouth daily. ) 90 tablet 3 Past Week at Unknown time  . gabapentin (NEURONTIN) 300 MG capsule Take 300 mg by mouth 2 (two) times daily.    Past Week at Unknown time  . insulin degludec (TRESIBA FLEXTOUCH) 100 UNIT/ML FlexTouch Pen Inject 30 Units into the skin daily.    Past Week at Unknown time  . losartan (COZAAR) 50 MG tablet Take 1 tablet (50 mg total) by mouth daily. 90 tablet 3 Past Week at Unknown time  . metFORMIN (GLUCOPHAGE) 1000 MG tablet Take 1,000 mg by mouth 2 (two) times daily with a meal.   Past Week at Unknown time  . nitroGLYCERIN (NITROSTAT) 0.4 MG SL  tablet PLACE 1 TABLET UNDER THE TONGUE EVERY 5 (FIVE) MINUTES AS NEEDED FOR CHEST PAIN (ANGINA). (Patient taking differently: Place 0.4 mg under the tongue every 5 (five) minutes as needed for chest pain. ) 25 tablet 6 unknown  . pantoprazole (PROTONIX) 40 MG tablet TAKE 1 TABLET BY MOUTH EVERY DAY (Patient taking differently: Take 40 mg by mouth daily. ) 90 tablet 1 Past Week at Unknown time  . rosuvastatin (CRESTOR) 40 MG tablet Take 1 tablet (40 mg total) by mouth daily. 90 tablet 3 Past Week at Unknown time    Assessment: 74 YOM who presented with acute R PCA occlusion s/p endovascular treatment. Patient was on Eliquis PTA for h/o Afib which is currently on hold. Pharmacy consulted to start IV heparin without bolus for secondary stroke prophylaxis in the setting of AFib.  APTT this evening is now subtherapeutic at 57 after rate decrease earlier today (aPTT high on heparin at 900 units/hr). Heparin level remains > 2.2, likely from AKI in the setting of outpatient Eliquis use. H/H down slightly today. Plt wnl. No bleeding or issues with infusion per discussion with RN.  Goal of Therapy:  Heparin level 0.3-0.5 units/ml aPTT 66-85 seconds Monitor platelets by  anticoagulation protocol: Yes   Plan:  Increase IV heparin slightly to 900 units/hr  F/u 8 hr aPTT  Monitor daily heparin level/aPTT, CBC, and s/sx of bleeding    Arturo Morton, PharmD, BCPS Please check AMION for all [place] contact numbers Clinical Pharmacist 04/23/2020 7:24 PM

## 2020-04-23 NOTE — Progress Notes (Signed)
NAME:  Justin Blackburn., MRN:  [id number], DOB:  05/11/1951, LOS: 53 ADMISSION DATE:  04/13/2020, CONSULTATION DATE:  11/5 REFERRING MD:  Sloan Leiter, CHIEF COMPLAINT:  Vent mgmt    Brief History   1 y o male with hx AFib on Eliquis, CHF, DM, CAD (CABG 2007), HTN, esophageal cancer initially admitted 10/31 with COVID PNA and respiratory failure.  On 11/5 he was noted to have L sided weakness and w/u revealed R PCA occlusion and he was taken for IR revascularization.    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   Significant Hospital Events     Consults:  Neuro PCCM  Procedures:  IR revascularization R PCA occlusion 11/5 ETT 11/5>>>  Significant Diagnostic Tests:  CT head/ CTA head/neck 11/5>>> 1. Occlusion right posterior cerebral artery which appears acute.  2. Moderate stenosis distal right vertebral artery and severe stenosis distal left vertebral artery. Moderate stenosis proximal right vertebral artery origin and mild stenosis origin of left vertebral artery  3. No significant carotid stenosis in the neck. Mild cavernous carotid stenosis on the right and moderate left cavernous carotid Stenosis  4. 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 5. Both middle cerebral arteries are patent without significant Stenosis.  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 Interval correction/resolution of previously seen proximal right PCA  occlusion. Severe atheromatous disease seen throughout the underlying right P2 and distal right PCA branches. 2. Extensive atheromatous disease involving the anterior cerebral arteries bilaterally, right worse than left, with severe multifocal right A2 and A3 stenoses. 3. Moderate approximate 50% stenosis involving the cavernous left ICA. 4. Moderate to severe bilateral V4 stenoses as above.   Micro Data:  BC x2 10/31>>> neg  Urine 10/31>>> insig growth  COVID 10/31>>> POS   Antimicrobials:    Interim history/subjective:  Patient was successfully extubated yesterday, he is following simple commands Patient's BUN and serum creatinine continue to trend up  Objective   Blood pressure (!) 163/55, pulse 83, temperature 97.6 F (36.4 C), temperature source Axillary, resp. rate (!) 27, height 6' (1.829 m), weight 86.4 kg, SpO2 (!) 89 %.        Intake/Output Summary (Last 24 hours) at 04/23/2020 1029 Last data filed at 04/23/2020 0900 Gross per 24 hour  Intake 2165.48 ml  Output 360 ml  Net 1805.48 ml   Filed Weights   03/23/2020 2102 05/05/2020 2115 04/22/20 1000  Weight: 94.3 kg 89 kg 86.4 kg    Examination: General: Chronically ill appearing male, on high flow nasal cannula HENT: Atraumatic, normocephalic, moist mucous membranes, NGT in place Lungs: Reduced air entry at the bases bilaterally, no wheezes or rhonchi Cardiovascular: Regular rate and rhythm, no murmur or gallop Abdomen: soft, bowel sounds present Extremities: warm and dry, no edema  Neuro: Opens eyes with vocal stimuli, following commands on right side, plegic on left upper extremity, left lower extremity 2/5  Resolved Hospital Problem list     Assessment & Plan:  Acute hypoxic/hypercapnic respiratory failure - multifactorial in setting covid pna now with stroke  COVID PNA Patient  was extubated successfully yesterday, now he is on high flow nasal cannula with O2 sat above 95% Decrease Solu-Medrol once  daily Titrate high flow with O2 sat goal of 90 to 95% Continue Solu-Medrol Patient completed therapy with remdesivir and Tocilizumab  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 Fib Patient's heart rate is well controlled Holding eliquis for now, started on heparin infusion for secondary stroke prophylaxis Continue amiodarone   HTN  Goal SBP 120-160 Continue amlodipine and HCTZ  Poorly controlled DM with hyperglycemia Fingerstick glucose is better controlled Continue Lantus to 25 units twice daily Continue sliding scale and tube feed coverage and sliding scale insulin  AKI on CKD stage IV Patient's BUN and serum creatinine continue to trend up Nephrology recommendations appreciated, they recommend continuing IV fluid and decreasing his steroid if possible for rising BUN   Patient with left-sided plegia and worsening renal failure, spoke with patient's wife she would like palliative care consultation to help making decisions about his care.  Palliative care consult is requested  Best practice:  Diet: NPO, tube feeds Pain/Anxiety/Delirium protocol (if indicated): As needed fentanyl VAP protocol (if indicated): Yes DVT prophylaxis: IV heparin infusion GI prophylaxis: PPI Glucose control: SSI/basal Mobility: BR Code Status: full Family Communication: Patient's wife was updated over the phone Disposition: ICU  Labs   CBC: Recent Labs  Lab 04/17/20 0021 04/17/20 0021 04/18/20 0233 04/18/20 0233 04/28/2020 0024 04/23/2020 2151 04/20/20 0434 04/20/20 0434 04/21/20 0555 04/21/20 0555 04/21/20 1205 04/21/20 2013 04/22/20 0544 04/22/20 0939 04/23/20 0547  WBC 11.4*   < > 14.9*   < > 14.2*  --  18.6*  --  19.7*  --   --   --  18.9*  --   20.8*  NEUTROABS 10.3*  --  13.7*  --  12.9*  --   --   --   --   --   --   --   --   --   --   HGB 12.1*   < > 12.6*   < > 13.7   < > 10.0*   < > 11.8*   < > 11.6* 11.9* 11.3* 11.9* 10.8*  HCT 37.5*   < > 38.3*   < > 42.8   < > 32.9*   < > 36.3*   < > 34.0* 35.0* 35.8* 35.0* 33.9*  MCV 82.1   < > 80.5   < > 80.8  --  85.7  --  80.8  --   --   --  81.2  --  82.7  PLT 372   < > 380   < > 397  --  363  --  361  --   --   --  334  --  302   < > = values in this interval not displayed.    Basic Metabolic Panel: Recent Labs  Lab 04/17/20 0021 04/17/20 0021 04/18/20 0233 04/18/20 0233 04/22/2020 0024 05/04/2020 2151 04/20/20 0434 04/20/20 0434 04/20/20 1332 04/20/20 1332 04/21/20 0555 04/21/20 0555 04/21/20 1205 04/21/20 2013 04/22/20 0544 04/22/20 0939 04/22/20 1811 04/23/20 0547  NA 137   < > 138   < > 141   < > 142   < > 139   < > 142   < > 143 143  143 144  --  146*  K 4.3   < > 3.8   < > 3.8   < > 4.5   < > 4.9   < > 4.3   < > 4.4 4.7 4.7 4.7  --  5.0  CL 104   < > 105   < > 105  --  112*  --  108  --  110  --   --   --  110  --   --  114*  CO2 21*   < > 22   < > 23  --  17*  --  19*  --  19*  --   --   --  20*  --   --  19*  GLUCOSE 181*   < > 168*   < > 149*  --  250*  --  261*  --  206*  --   --   --  188*  --   --  295*  BUN 64*   < > 75*   < > 78*  --  70*  --  87*  --  110*  --   --   --  144*  --   --  173*  CREATININE 2.42*   < > 2.79*   < > 2.61*  --  2.44*  --  3.36*  --  4.62*  --   --   --  5.73*  --   --  5.86*  CALCIUM 8.8*   < > 8.7*   < > 8.9  --  6.8*  --  7.7*  --  8.2*  --   --   --  8.2*  --   --  8.2*  MG 2.0  --  1.9  --  2.0  --   --   --   --   --   --   --   --   --   --   --  2.8* 3.0*  PHOS  --   --   --   --   --   --   --   --   --   --   --   --   --   --   --   --  5.7* 5.8*   < > = values in this interval not displayed.   GFR: Estimated Creatinine Clearance: 12.7 mL/min (A) (by C-G formula based on SCr of 5.86 mg/dL (H)). Recent Labs  Lab  04/20/20 0434 04/21/20 0555 04/22/20 0544 04/23/20 0547  WBC 18.6* 19.7* 18.9* 20.8*    Liver Function Tests: Recent Labs  Lab 05/06/2020 0024 04/20/20 0434 04/21/20 0555 04/22/20 0544 04/23/20 0547  AST 20 21 20 22 29   ALT 17 14 11 8 10   ALKPHOS 93 66 77 79 72  BILITOT 0.7 0.7 0.6 0.4 0.5  PROT 6.4* 4.6* 5.0* 5.0* 4.8*  ALBUMIN 2.7* 2.0* 2.4* 2.3* 2.2*   No results for input(s): LIPASE, AMYLASE in the last 168 hours. No results for input(s): AMMONIA in the last 168 hours.  ABG    Component Value Date/Time   PHART 7.369 04/22/2020 0939   PCO2ART 37.3 04/22/2020 0939   PO2ART 63 (L) 04/22/2020 0939   HCO3 21.4 04/22/2020 0939   TCO2 23 04/22/2020 0939   ACIDBASEDEF 3.0 (H) 04/22/2020 0939   O2SAT 91.0 04/22/2020 0939     Coagulation Profile: No results for input(s): INR, PROTIME in the last 168 hours.  Cardiac Enzymes: No results for input(s): CKTOTAL, CKMB, CKMBINDEX, TROPONINI in the last 168 hours.  HbA1C: Hgb A1c MFr Bld  Date/Time Value Ref Range Status  04/20/2020 04:34 AM 7.4 (H) 4.8 - 5.6 % Final    Comment:    (NOTE) Pre diabetes:          5.7%-6.4%  Diabetes:              >6.4%  Glycemic control for   <7.0% adults with diabetes   04/15/2020 04:50 AM 7.0 (H) 4.8 - 5.6 % Final    Comment:    (NOTE) Pre diabetes:          5.7%-6.4%  Diabetes:              >6.4%  Glycemic control for   <7.0% adults with diabetes     CBG: Recent Labs  Lab 04/22/20 1549 04/22/20 2003 04/23/20 0048 04/23/20 0404 04/23/20 0829  GLUCAP 100* 125* 172* 236* 267*    Past Medical History  He,  has a past medical history of A-fib ([place]), Acute kidney injury ([place]), Anemia, Angina, Arthritis, Atrial flutter, paroxysmal ([place]), Cancer ([place]), CHF (congestive heart failure) ([place]), Chronic lower back pain, Coronary artery disease, DVT (deep venous thrombosis) ([place]), Dyspnea, Erectile dysfunction, Heart failure ([place]), Hypercholesteremia, Hypertension, Insulin-requiring  or dependent type 2 diabetes mellitus, Myocardial infarction ([place]) (1988; ~1992;  ~1996;~ 2000), Obesity, Peripheral vascular disease ([place]), and Umbilical hernia.   Surgical History    Past Surgical History:  Procedure Laterality Date  . CARDIOVERSION N/A 01/29/2015   Procedure: CARDIOVERSION;  Surgeon: [name], MD;  Location: [facility];  Service: Cardiovascular;  Laterality: N/A;  . CARDIOVERSION N/A 04/26/2018   Procedure: CARDIOVERSION;  Surgeon: Josue Hector, MD;  Location[facility] ENDOSCOPY;  Service: Cardiovascular;  Laterality: N/A;  . CORONARY ANGIOPLASTY WITH STENT PLACEMENT  08/31/11   "2 today; makes total of 3 stents"  . CORONARY ANGIOPLASTY WITH STENT PLACEMENT  08/10/2017   "2 today; makes a total of 5 stents" (08/10/2017)  . CORONARY ARTERY BYPASS GRAFT  1998; 2007   CABG X 4; CABG X4  . CORONARY STENT INTERVENTION N/A 08/10/2017   Procedure: CORONARY STENT INTERVENTION;  Surgeon: Sherren Mocha, MD;  Location: [place] CV LAB;  Service: Cardiovascular;  Laterality: N/A;  . ESOPHAGOGASTRODUODENOSCOPY (EGD) WITH PROPOFOL N/A 09/01/2017   Procedure: ESOPHAGOGASTRODUODENOSCOPY (EGD) WITH PROPOFOL;  Surgeon: Laurence Spates, MD;  Location: [place];  Service: Endoscopy;  Laterality: N/A;  . EUS N/A 09/29/2017   Procedure: UPPER ENDOSCOPIC ULTRASOUND (EUS) LINEAR;  Surgeon: Arta Silence, MD;  Location: [facility] ENDOSCOPY;  Service: Endoscopy;  Laterality: N/A;  . LEFT HEART CATH AND CORS/GRAFTS ANGIOGRAPHY N/A 08/10/2017   Procedure: LEFT HEART CATH AND CORS/GRAFTS ANGIOGRAPHY;  Surgeon: Sherren Mocha, MD;  Location: [place] CV LAB;  Service: Cardiovascular;  Laterality: N/A;  . LEFT HEART CATHETERIZATION WITH CORONARY/GRAFT ANGIOGRAM N/A 08/31/2011   Procedure: LEFT HEART CATHETERIZATION WITH Beatrix Fetters;  Surgeon: [name], MD;  Location[facility] CATH LAB;  Service: Cardiovascular;  Laterality: N/A;  . PERIPHERAL VASCULAR CATHETERIZATION N/A  04/27/2016   Procedure: Abdominal Aortogram w/Lower Extremity;  Surgeon: Angelia Mould, MD;  Location: [place] [facility] LAB;  Service: Cardiovascular;  Laterality: N/A;  . RADIOLOGY WITH ANESTHESIA N/A 05/02/2020   Procedure: IR WITH ANESTHESIA;  Surgeon: Radiologist, Medication, MD;  Location: [place];  Service: Radiology;  Laterality: N/A;  . TEE WITHOUT CARDIOVERSION N/A 01/29/2015   Procedure: TRANSESOPHAGEAL ECHOCARDIOGRAM (TEE);  Surgeon: [name], MD;  Location: [facility] ENDOSCOPY;  Service: Cardiovascular;  Laterality: N/A;     Social History   reports that he quit smoking about 48 years ago. His smoking use included cigarettes. He has a 12.00 pack-year smoking history. He has never used smokeless tobacco. He reports that he does not drink alcohol and does not use drugs.   Family History   His family history includes Diabetes in his father and mother; Heart attack in his brother, father, and mother; Heart disease in his father and mother. There is no history of Stroke.   Allergies No Known Allergies   Home Medications  Prior to Admission medications   Medication Sig Start Date End Date Taking? Authorizing Provider  amiodarone (PACERONE) 200 MG tablet TAKE 1 TABLET BY MOUTH EVERY DAY Patient taking differently: Take 200 mg by mouth daily.  03/21/20  Yes Belva Crome, MD  ELIQUIS 5 MG TABS tablet TAKE 1 TABLET BY MOUTH TWICE A DAY 04/30/2020   Belva Crome, MD  gabapentin (NEURONTIN) 300 MG capsule Take 300 mg by mouth 2 (two) times daily.    Yes [provider]  insulin degludec (TRESIBA FLEXTOUCH) 100 UNIT/ML FlexTouch Pen Inject 30 Units into the skin daily.    Yes [provider]  losartan (COZAAR) 50 MG tablet Take 1 tablet (50 mg total) by mouth daily. 03/14/20  Yes Belva Crome, MD  metFORMIN (GLUCOPHAGE) 1000 MG tablet Take 1,000 mg by mouth 2 (two) times daily with a meal. 05/19/18  Yes [provider]  nitroGLYCERIN (NITROSTAT) 0.4 MG SL  tablet PLACE 1 TABLET UNDER THE TONGUE EVERY 5 (FIVE) MINUTES AS NEEDED FOR CHEST PAIN (ANGINA). Patient taking differently: Place 0.4 mg under the tongue every 5 (five) minutes as needed for chest pain.  01/23/20  Yes Belva Crome, MD  pantoprazole (PROTONIX) 40 MG tablet TAKE 1 TABLET BY MOUTH EVERY DAY Patient taking differently: Take 40 mg by mouth daily.  10/23/19  Yes Belva Crome, MD  rosuvastatin (CRESTOR) 40 MG tablet Take 1 tablet (40 mg total) by mouth daily. 04/20/19 05/14/20 Yes Belva Crome, MD    Total critical care time: 43minutes  Performed by: [place] care time was exclusive of separately billable procedures and treating other patients.   Critical care was necessary to treat or prevent imminent or life-threatening deterioration.   Critical care was time spent personally by me on the following activities: development of treatment plan with patient and/or surrogate as well as nursing, discussions with consultants, evaluation of patient's response to treatment, examination of patient, obtaining history from patient or surrogate, ordering and performing treatments and interventions, ordering and review of laboratory studies, ordering and review of radiographic studies, pulse oximetry and re-evaluation of patient's condition.   Jacky Kindle MD Critical care physician [facility]  Pager: [phone] Mobile: [phone]

## 2020-04-23 NOTE — Progress Notes (Signed)
Inpatient Diabetes Program Recommendations  AACE/ADA: New Consensus Statement on Inpatient Glycemic Control (2015)  Target Ranges:  Prepandial:   less than 140 mg/dL      Peak postprandial:   less than 180 mg/dL (1-2 hours)      Critically ill patients:  140 - 180 mg/dL   Lab Results  Component Value Date   GLUCAP 267 (H) 04/23/2020   HGBA1C 7.4 (H) 04/20/2020    Review of Glycemic Control Results for Justin Blackburn, Justin Blackburn (MRN [id number]) as of 04/23/2020 09:32  Ref. Range 04/22/2020 20:03 04/23/2020 00:48 04/23/2020 04:04 04/23/2020 08:29  Glucose-Capillary Latest Ref Range: 70 - 99 mg/dL 125 (H) 172 (H) 236 (H) 267 (H)  Diabetes history: Type 2 DM Outpatient Diabetes medications: Tresiba 30 units QD, Metformin 1000 mg BID Current orders for Inpatient glycemic control: Novolog 6 units Q4H, Lantus 25 units BID, Novolog 0-15 units Q4H Jevity 1.2 cal @ 40 ml to increase to 75 ml/hr today  Inpatient Diabetes Program Recommendations:    Noted insulin adjustments, with increasing rates of tube feeds would also consider increasing tube feed coverage to Novolog 10 units Q4H (to be stopped or held if tube feeds are stopped).   Thanks, Bronson Curb, MSN, RNC-OB Diabetes Coordinator [phone] (8a-5p)

## 2020-04-23 NOTE — Progress Notes (Signed)
ANTICOAGULATION CONSULT NOTE  Pharmacy Consult for heparin Indication: atrial fibrillation  No Known Allergies  Patient Measurements: Height: 6' (182.9 cm) Weight: 86.4 kg (190 lb 7.6 oz) IBW/kg (Calculated) : 77.6 Heparin Dosing Weight: 86 kg   Vital Signs: Temp: 97.6 F (36.4 C) (11/09 0800) Temp Source: Axillary (11/09 0800) BP: 163/55 (11/09 0904) Pulse Rate: 81 (11/09 0820)  Labs: Recent Labs    04/21/20 0555 04/21/20 1205 04/22/20 0544 04/22/20 0544 04/22/20 0939 04/22/20 1811 04/22/20 2016 04/23/20 0547 04/23/20 0745  HGB 11.8*   < > 11.3*   < > 11.9*  --   --  10.8*  --   HCT 36.3*   < > 35.8*  --  35.0*  --   --  33.9*  --   PLT 361  --  334  --   --   --   --  302  --   APTT  --   --   --   --   --   --  122*  --  91*  HEPARINUNFRC  --   --   --   --   --  >2.20* >2.20* >2.20*  --   CREATININE 4.62*  --  5.73*  --   --   --   --  5.86*  --    < > = values in this interval not displayed.    Estimated Creatinine Clearance: 12.7 mL/min (A) (by C-G formula based on SCr of 5.86 mg/dL (H)).   Medical History: Past Medical History:  Diagnosis Date  . A-fib ([place])    with RVR  . Acute kidney injury ([place])   . Anemia   . Angina   . Arthritis    "hands" (08/10/2017)  . Atrial flutter, paroxysmal ([place])    arrived with aflutter with RVR, EF down to 25-30%, s/p TEE DCCV on 01/29/2015  . Cancer ([place])   . CHF (congestive heart failure) ([place])   . Chronic lower back pain    "all the time from my sciatic nerve"  . Coronary artery disease   . DVT (deep venous thrombosis) ([place])   . Dyspnea   . Erectile dysfunction   . Heart failure ([place])   . Hypercholesteremia   . Hypertension   . 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 Myocardial infarction ([place]) 1988; ~1610;  ~1996;~ 2000  . Obesity   . Peripheral vascular disease ([place])   . Umbilical hernia    unrepaired    Medications:  Medications Prior to Admission  Medication Sig Dispense Refill  Last Dose  . amiodarone (PACERONE) 200 MG tablet TAKE 1 TABLET BY MOUTH EVERY DAY (Patient taking differently: Take 200 mg by mouth daily. ) 90 tablet 3 Past Week at Unknown time  . gabapentin (NEURONTIN) 300 MG capsule Take 300 mg by mouth 2 (two) times daily.    Past Week at Unknown time  . insulin degludec (TRESIBA FLEXTOUCH) 100 UNIT/ML FlexTouch Pen Inject 30 Units into the skin daily.    Past Week at Unknown time  . losartan (COZAAR) 50 MG tablet Take 1 tablet (50 mg total) by mouth daily. 90 tablet 3 Past Week at Unknown time  . metFORMIN (GLUCOPHAGE) 1000 MG tablet Take 1,000 mg by mouth 2 (two) times daily with a meal.   Past Week at Unknown time  . nitroGLYCERIN (NITROSTAT) 0.4 MG SL tablet PLACE 1 TABLET UNDER THE TONGUE EVERY 5 (FIVE) MINUTES AS NEEDED FOR CHEST PAIN (ANGINA). (Patient  taking differently: Place 0.4 mg under the tongue every 5 (five) minutes as needed for chest pain. ) 25 tablet 6 unknown  . pantoprazole (PROTONIX) 40 MG tablet TAKE 1 TABLET BY MOUTH EVERY DAY (Patient taking differently: Take 40 mg by mouth daily. ) 90 tablet 1 Past Week at Unknown time  . rosuvastatin (CRESTOR) 40 MG tablet Take 1 tablet (40 mg total) by mouth daily. 90 tablet 3 Past Week at Unknown time    Assessment: 51 YOM who presented with acute R PCA occlusion s/p endovascular treatment. Patient was on Eliquis PTA for h/o Afib which is currently on hold. Pharmacy consulted to start IV heparin without bolus for secondary stroke prophylaxis in the setting of AFib.  Currently on IV heparin at 900 units/hr. HL remains > 2.2 likely from AKI in the setting of outpatient Eliquis use. APTT is 91 which is mildly supratherapeutic. H/H down slightly. Plt wnl. No s/s of overt bleeding note.   Goal of Therapy:  Heparin level 0.3-0.5 units/ml aPTT 66-85 seconds Monitor platelets by anticoagulation protocol: Yes   Plan:  Decrease IV heparin slightly to 850 units/hr  F/u 8 hr aPTT  Monitor daily CBC, and  s/sx of bleeding    Albertina Parr, PharmD., BCPS, BCCCP Clinical Pharmacist Please refer to [facility] for unit-specific pharmacist

## 2020-04-23 NOTE — Progress Notes (Signed)
STROKE TEAM PROGRESS NOTE   INTERVAL HISTORY Patient was extubated yesterday.  Is still on high flow oxygen and oxygen sats remain in the low 90s to the high 80s.[name]  He is arousable and follows simple commands.  Vital signs are stable. Nephrology feel that his acute renal failure is d/t contrast nephropathy, high BUN d/t steroid use.  Have increase IV fluids.  Palliative care consult to discuss goals of care recommended OBJECTIVE Vitals:   04/23/20 0800 04/23/20 0820 04/23/20 0900 04/23/20 0904  BP: (!) 154/69 (!) 165/58 (!) 155/77 (!) 163/55  Pulse: 80 81 83   Resp: 19 (!) 26 (!) 27   Temp: 97.6 F (36.4 C)     TempSrc: Axillary     SpO2: 95% 91% (!) 89%   Weight:      Height:       CBC:  Recent Labs  Lab 04/18/20 0233 04/18/20 0233 04/15/2020 0024 04/16/2020 2151 04/22/20 0544 04/22/20 0544 04/22/20 0939 04/23/20 0547  WBC 14.9*   < > 14.2*   < > 18.9*  --   --  20.8*  NEUTROABS 13.7*  --  12.9*  --   --   --   --   --   HGB 12.6*   < > 13.7   < > 11.3*   < > 11.9* 10.8*  HCT 38.3*   < > 42.8   < > 35.8*   < > 35.0* 33.9*  MCV 80.5   < > 80.8   < > 81.2  --   --  82.7  PLT 380   < > 397   < > 334  --   --  302   < > = values in this interval not displayed.   Basic Metabolic Panel:  Recent Labs  Lab 04/21/2020 0024 04/22/20 0544 04/22/20 0544 04/22/20 0939 04/22/20 1811 04/23/20 0547  NA  --  143   < > 144  --  146*  K  --  4.7   < > 4.7  --  5.0  CL  --  110  --   --   --  114*  CO2  --  20*  --   --   --  19*  GLUCOSE  --  188*  --   --   --  295*  BUN  --  144*  --   --   --  173*  CREATININE  --  5.73*  --   --   --  5.86*  CALCIUM  --  8.2*  --   --   --  8.2*  MG   < >  --   --   --  2.8* 3.0*  PHOS  --   --   --   --  5.7* 5.8*   < > = values in this interval not displayed.   IMAGING pst 24h US RENAL  Result Date: 04/22/2020 CLINICAL DATA:  Acute kidney injury. EXAM: RENAL / URINARY TRACT ULTRASOUND COMPLETE COMPARISON:  None. FINDINGS: Right Kidney:  Renal measurements: 12.2 x 5.2 x 5.1 cm = volume: 160 mL. There is no hydronephrosis. There is increased cortical echogenicity. Left Kidney: Renal measurements: 12.1 x 6.2 x 6.6 cm = volume: 257 mL. There is increased cortical echogenicity without evidence for hydronephrosis. Bladder: The bladder is decompressed with a Foley catheter and therefore is poorly evaluated. Other: None. IMPRESSION: 1. No hydronephrosis. 2. Echogenic kidneys bilaterally which can be seen in patients with medical renal  disease. Electronically Signed   By: Constance Holster M.D.   On: 04/22/2020 19:19   DG Abd Portable 1V  Result Date: 04/22/2020 CLINICAL DATA:  Check gastric catheter placement EXAM: PORTABLE ABDOMEN - 1 VIEW COMPARISON:  04/20/2020 FINDINGS: Gastric catheter is been removed in the interval. Feeding catheter is now noted extending into the distal stomach. IMPRESSION: Feeding catheter in the distal stomach. Electronically Signed   By: Inez Catalina M.D.   On: 04/22/2020 14:20     PHYSICAL EXAM   Blood pressure (!) 163/55, pulse 83, temperature 97.6 F (36.4 C), temperature source Axillary, resp. rate (!) 27, height 6' (1.829 m), weight 86.4 kg, SpO2 (!) 89 %. CV: RRR, no M/R/G, left arm peripheral edema Elderly Caucasian male  Head is atraumatic,normocephalic,  Neurological Exam :  Eyes are open,speaks few words but nonfluent speechl. Can follow some simple commands on left side only., Conjugate gaze right preference does not cross the midine to the left. Pupils sluggish and pin point.Blinks on the right not on the left.  .  The patient left the office before the visit was finished. Lower face weakness. Withdraws to stim slighlt right LE, no w/d to stil LUE.. Moves right UE and LE spontaneously and puposefully . Gait deferred.    ASSESSMENT/PLAN [name]. is a 69 y.o. male with history of A. 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 Vaccinated but admitted with hypoxia from Covid on 10/31. On 03/19/20 he was found to be not moving his left side. Not a tPA candidate due to Eliquis. Taken for thrombectomy. IR - Complete revascularization of RT PCA achieving a TICI 3 revascularization. S/P Balloon angioplasty of severe stenosis of Lt VBJ 60 to 70 % patency.  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  Also had severe stenosis of left vertebrobasilar junction status post balloon angioplasty    Code Stroke CT Head - Small acute cortical infarct of the distal Right ACA territory. No associated hemorrhage or mass effect. ASPECTS 9. Otherwise stable compared to September  CTA H&N - Occlusion right posterior cerebral artery which appears acute. Moderate stenosis distal right vertebral artery and severe stenosis distal left vertebral artery. Moderate stenosis proximal right vertebral artery origin and mild stenosis origin of left vertebral artery. 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 Associated petechial hemorrhage without hemorrhagic transformation or significant mass effect. Underlying age-related cerebral atrophy with moderate chronic small vessel ischemic disease.   MRA head -  Interval correction/resolution of previously seen proximal right PCA occlusion. Severe atheromatous disease seen throughout the underlying right P2 and distal right PCA branches. Extensive atheromatous disease involving the anterior cerebral arteries bilaterally, right  worse than left, with severe multifocal right A2 and A3 stenoses. Moderate approximate 50% stenosis involving the cavernous left ICA. Moderate to severe bilateral V4 stenoses  2D Echo - EF 65 - 70%. No cardiac source of emboli identified. '  Sars Corona Virus 2 - positive (6 days ago)  Bilateral Lower Extremity Venous Dopplers - negative for DVT  EEG -  This study is suggestive of moderate to severe diffuse encephalopathy, nonspecific etiology. No seizures or epileptiform discharges were seen throughout the recording.   LDL - 43  HgbA1c - 7.4  VTE prophylaxis - SCDs .   Eliquis (apixaban) daily prior to admission, now on IV heparin till he is able to swallow.  Will consider restarting Eliquis once po access. Continue IV heparin stroke protocol and continue low dose  aspirin  Given intracranial angioplasty  Therapy recommendations:  pending  Disposition:  Pending  Acute hypoxic/hypercapnic respiratory failure - multifactorial in setting covid pna now with stroke  COVID PNA  Intubated for IR, not extubated following   Intubated  Extubated 04/22/20  Patient completed therapy with remdesivir and Tocilizumab  CCM on board  Atrial Fibrillation  Home anticoagulation:  Eliquis (apixaban) daily   HR controlled on amiodarone 200 daily  Eliquis on hold  Start IV heparin  Continue aspirin 81  Plan resume Eliquis once po access.    Hypertension  Home BP meds: Cozaar  Current BP meds: Norvasc 10   Stable . SBP goal below 180  . Long-term BP goal normotensive  Hyperlipidemia  Home Lipid lowering medication: Crestor 40 mg daily  LDL 43, goal < 70  Current lipid lowering medication: Crestor 40 mg daily  Continue statin at discharge  Diabetes type II, uncontrolled w/ hyperglycemia  Home diabetic meds: Tresiba 30 units QD, Metformin 1000 mg BID  Current diabetic meds: Novolog 6->10 units Q4H, Lantus 20->22 units BID, Novolog 0-15 units Q4H  HgbA1c 7.4, goal <  7.0 Recent Labs    04/23/20 0048 04/23/20 0404 04/23/20 0829  GLUCAP 172* 236* 267*  DB RN Coordinator following  Dysphagia . Secondary to stroke . Cortrak w/ tube feeds . 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   Added IVF.   Renal US no hydro, kidney dz.   If no improvement in labs, headed towards renal replacement.   Recommend palliative care for goals of care  Other Stroke Risk Factors  Advanced age  Former cigarette smoker - quit  Coronary artery disease s/p CABG   Congestive Heart Failure  Atrial fibrillation  Other Active Problems  Anemia - Hgb - 11.3->11.9->10.8  Leukocytosis - WBC's - 18.6->19.7->18.9->20.8  (afebrile) (steroids)   Diffuse moderate to severe cerebrovascular disease.  Given worsening renal failure in setting of COVID and stroke, will ask palliative care to see to help family with goals of care. Recommend mobilize and therapy consults.  Will discuss with family goals of care and palliative care consult.  I called his wife listed phone number and left message for her to call me back to discuss his condition.  Discussed with Dr. Tacy Learn critical care medicine and Dr. Carolin Sicks nephrology. Will ask medical hospitalist team to assume care after he leaves ICU This patient is critically ill and at significant risk of neurological worsening, death and care requires constant monitoring of vital signs, hemodynamics,respiratory and cardiac monitoring, extensive review of multiple databases, frequent neurological assessment, discussion with family, other specialists and medical decision making of high complexity.I have made any additions or clarifications directly to the above note.This critical care time does not reflect procedure time, or teaching time or supervisory time of PA/NP/Med Resident etc but could involve care discussion time.  I spent 30  minutes of neurocritical care time  in the care of  this  patient.    Antony Contras, [facility] day # 9       To contact Stroke Continuity provider, please refer to [email or web address] After hours, contact General Neurology

## 2020-04-23 NOTE — Progress Notes (Signed)
[place] Progress Note Patient Name: Justin Blackburn. DOB: 08/21/1950 MRN: [id number]   Date of Service  04/23/2020  HPI/Events of Note  Patient with oliguria in the context of a creatinine of  5.86, patient is  scheduled  For CRRT tomorrow .  eICU Interventions  No intervention, await CRRT.        Kerry Kass Shakesha Soltau 04/23/2020, 10:28 PM

## 2020-04-23 NOTE — Evaluation (Signed)
Physical Therapy Re-Evaluation Patient Details Name: Justin Blackburn. MRN: [id number] DOB: [birth year] Today's Date: 04/23/2020   History of Present Illness  69 yo male presenting to ED via EMS with fever and shortness of breath. Tested COVID-19 positive; vaccinated. PMH including A-fib, CAD, diabetes,  ischemic cardiomyopathy, CKD stage lll, DVT on chronic anticoagulation, hyperlipidemia, HTN, esophageal cancer, and anemia of chronic disease.  L sided weakness present during session on 05/04/2020 thought to be new.  Code stroke called. Pt found to have infarcts in R PCA and R ACA territory, now s/p IR revascularization of R PCA occlusion. Pt was intubated 11/5-11/8.   Clinical Impression  Pt has declined from his covid levels due to R side stroke leaving pt unable to move on the L side upper and LE's.  Pt is now total A of 2 persons for basic mobility.  He is able to follow simple commands if in conjunction with v/t cues  Covid is still also limiting pt's ability to participate and mobilize with sats dropping into the lower 80's sitting upright working on balance at EOB.  Pt will now need some post acute follow up before getting the potential to get home with available assist.  Goals will need to be adjusted for post stroke levels.     Follow Up Recommendations SNF;Supervision/Assistance - 24 hour    Equipment Recommendations  Other (comment) (TBD next venue)    Recommendations for Other Services       Precautions / Restrictions Precautions Precautions: Other (comment);Fall Precaution Comments: watch O2 sats, L side hemi Restrictions Weight Bearing Restrictions: No      Mobility  Bed Mobility Overal bed mobility: Needs Assistance Bed Mobility: Supine to Sit;Sit to Supine     Supine to sit: Total assist;+2 for physical assistance;+2 for safety/equipment Sit to supine: Total assist;+2 for physical assistance;+2 for safety/equipment   General bed mobility comments: minimal to no  initiation to self assist with transitions; use of bedpad to assist throughout    Transfers                 General transfer comment: deferred today  Ambulation/Gait                Stairs            Wheelchair Mobility    Modified Rankin (Stroke Patients Only) Modified Rankin (Stroke Patients Only) Pre-Morbid Rankin Score: No symptoms Modified Rankin: Severe disability     Balance Overall balance assessment: Needs assistance Sitting-balance support: Feet supported;Single extremity supported Sitting balance-Leahy Scale: Zero Sitting balance - Comments: totalA for balance EOB, pt with strong forward flexion at neck and rounded back, noted attempts to extend neck (several attempts) but he is unable to achieve on his own.                                      Pertinent Vitals/Pain Pain Assessment: Faces Faces Pain Scale: No hurt Pain Intervention(s): Monitored during session    Home Living                        Prior Function                 Hand Dominance        Extremity/Trunk Assessment   Upper Extremity Assessment Upper Extremity Assessment: Defer to OT evaluation LUE Deficits / Details: flaccid LUE  LUE Sensation: decreased light touch LUE Coordination: decreased fine motor;decreased gross motor    Lower Extremity Assessment Lower Extremity Assessment: LLE deficits/detail LLE Deficits / Details: no volitional movement or spontaneous movement noted LLE Sensation: decreased light touch LLE Coordination: decreased fine motor;decreased gross motor    Cervical / Trunk Assessment Cervical / Trunk Assessment: Other exceptions (pt unable to hold his head up today, initiated extention onl)  Communication      Cognition Arousal/Alertness: Awake/alert Behavior During Therapy: Flat affect Overall Cognitive Status: Impaired/Different from baseline Area of Impairment: Awareness;Problem solving;Following commands                        Following Commands: Follows one step commands inconsistently;Follows one step commands with increased time (and much cueing)   Awareness: Intellectual Problem Solving: Slow processing;Decreased initiation;Requires verbal cues;Requires tactile cues General Comments: pt requiring repetition and verbal/tactile cues for all commands today. only responding with head nods.       General Comments General comments (skin integrity, edema, etc.): pt on 15L HFNC with lowest O2 sat noted 80% while EOB, returning to 90% after returned to supine and repositioned sitting upright at bed level (approx 5-7 min); HR, BP and RR stable throughout with no signs of accessory breathing/ no increased WOB noted     Exercises Other Exercises Other Exercises: warmup exercises/MMT to bil UE and LE Other Exercises: working on cervical extension while seated EOB and once returned to supine    Assessment/Plan    PT Assessment Patient needs continued PT services  PT Problem List Decreased strength;Decreased activity tolerance;Decreased balance;Decreased mobility;Decreased coordination;Decreased cognition;Cardiopulmonary status limiting activity;Impaired sensation       PT Treatment Interventions DME instruction;Gait training;Functional mobility training;Therapeutic activities;Therapeutic exercise;Balance training;Neuromuscular re-education;Patient/family education    PT Goals (Current goals can be found in the Care Plan section)  Acute Rehab PT Goals Patient Stated Goal: none stated, reports being comfortable end of session PT Goal Formulation: With patient Time For Goal Achievement: 04/30/20 Potential to Achieve Goals: Good    Frequency Min 3X/week   Barriers to discharge        Co-evaluation PT/OT/SLP Co-Evaluation/Treatment: Yes Reason for Co-Treatment: Complexity of the patient's impairments (multi-system involvement);For patient/therapist safety PT goals addressed during  session: Mobility/safety with mobility         AM-PAC PT "6 Clicks" Mobility  Outcome Measure Help needed turning from your back to your side while in a flat bed without using bedrails?: Total Help needed moving from lying on your back to sitting on the side of a flat bed without using bedrails?: Total Help needed moving to and from a bed to a chair (including a wheelchair)?: Total Help needed standing up from a chair using your arms (e.g., wheelchair or bedside chair)?: Total Help needed to walk in hospital room?: Total Help needed climbing 3-5 steps with a railing? : Total 6 Click Score: 6    End of Session Equipment Utilized During Treatment: Oxygen Activity Tolerance: Patient limited by fatigue Patient left: in bed;with call bell/phone within reach;with bed alarm set;with SCD's reapplied Nurse Communication: Other (comment) PT Visit Diagnosis: Muscle weakness (generalized) (M62.81);Hemiplegia and hemiparesis;Difficulty in walking, not elsewhere classified (R26.2);Other abnormalities of gait and mobility (R26.89) Hemiplegia - Right/Left: Left Hemiplegia - dominant/non-dominant: Non-dominant Hemiplegia - caused by: Cerebral infarction    Time: 8119-1478 PT Time Calculation (min) (ACUTE ONLY): 42 min   Charges:   PT Evaluation $PT Re-evaluation: 1 Re-eval  04/23/2020  Ginger Carne., PT Acute Rehabilitation Services [phone]  (pager) [phone]  (office)  Tessie Fass Khristina Janota 04/23/2020, 6:35 PM

## 2020-04-23 NOTE — Evaluation (Addendum)
Occupational Therapy Re-Evaluation Patient Details Name: Justin Blackburn. MRN: [id number] DOB: [birth year] Today's Date: 04/23/2020    History of Present Illness 69 yo male presenting to ED via EMS with fever and shortness of breath. Tested COVID-19 positive; vaccinated. PMH including A-fib, CAD, diabetes,  ischemic cardiomyopathy, CKD stage lll, DVT on chronic anticoagulation, hyperlipidemia, HTN, esophageal cancer, and anemia of chronic disease.  L sided weakness present during session on 04/16/2020 thought to be new.  Code stroke called. Pt found to have infarcts in R PCA and R ACA territory, now s/p IR revascularization of R PCA occlusion. Pt was intubated 11/5-11/8.    Clinical Impression   Pt seen for re-evaluation given recent infarcts and intubation. Pt with notable L side weakness (LUE flaccid), but is globally weak and requiring overall totalA(+2) for completion of mobility tasks this sessions. Pt tolerating sitting EOB for short period of time requiring up to [place] for static balance including postural corrections; is currently totalA for ADL. Pt following some simple commands though requiring significant repetition and multimodal cues for completion. Pt on 15L HFNC with lowest SpO2 noted 80% while seated EOB, returning to 90% with return to supine and repositioned sitting upright at bed level; additional vitals stable throughout. Pt to benefit from continued acute OT services, have updated d /c recommendations given current status and will update acute OT goals. Now recommending SNF level rehab at time of discharge to maximize his safety and independence with ADL and mobility prior to return home.     Follow Up Recommendations  SNF;Supervision/Assistance - 24 hour    Equipment Recommendations  Other (comment) (TBD)    Recommendations for Other Services Other (comment) (palliative)     Precautions / Restrictions Precautions Precautions: Other (comment);Fall Precaution Comments: watch  O2 sats, L side hemi Restrictions Weight Bearing Restrictions: No      Mobility Bed Mobility Overal bed mobility: Needs Assistance Bed Mobility: Supine to Sit;Sit to Supine     Supine to sit: Total assist;+2 for physical assistance;+2 for safety/equipment Sit to supine: Total assist;+2 for physical assistance;+2 for safety/equipment   General bed mobility comments: minimal to no initiation to self assist with transitions; use of bedpad to assist throughout    Transfers                 General transfer comment: deferred today    Balance Overall balance assessment: Needs assistance Sitting-balance support: Feet supported;Single extremity supported Sitting balance-Leahy Scale: Zero Sitting balance - Comments: totalA for balance EOB, pt with strong forward flexion at neck and rounded back, noted attempts to extend neck (several attempts) but he is unable to achieve on his own.                                    ADL either performed or assessed with clinical judgement   ADL Overall ADL's : Needs assistance/impaired Eating/Feeding: NPO   Grooming: Maximal assistance;Total assistance;Sitting Grooming Details (indicate cue type and reason): max hand over hand for wiping face/mouth with washcloth; pt with poor initiation to carry over task                              Functional mobility during ADLs: Total assistance;+2 for physical assistance;+2 for safety/equipment (bed mobility) General ADL Comments: pt requiring totalA for remaining ADL     Vision  Perception     Praxis      Pertinent Vitals/Pain Pain Assessment: Faces Faces Pain Scale: No hurt Pain Intervention(s): Monitored during session     Hand Dominance     Extremity/Trunk Assessment Upper Extremity Assessment Upper Extremity Assessment: LUE deficits/detail LUE Deficits / Details: flaccid LUE LUE Sensation: decreased light touch LUE Coordination: decreased fine  motor;decreased gross motor   Lower Extremity Assessment Lower Extremity Assessment: Defer to PT evaluation       Communication     Cognition Arousal/Alertness: Awake/alert Behavior During Therapy: Flat affect Overall Cognitive Status: Impaired/Different from baseline Area of Impairment: Awareness;Problem solving;Following commands                       Following Commands: Follows one step commands inconsistently;Follows one step commands with increased time (and much cueing)   Awareness: Intellectual Problem Solving: Slow processing;Decreased initiation;Requires verbal cues;Requires tactile cues General Comments: pt requiring repetition and verbal/tactile cues for all commands today. only responding with head nods.    General Comments  pt on 15L HFNC with lowest O2 sat noted 80% while EOB, returning to 90% after returned to supine and repositioned sitting upright at bed level (approx 5-7 min); HR, BP and RR stable throughout with no signs of accessory breathing/ no increased WOB noted     Exercises Other Exercises Other Exercises: warmup exercises/MMT to bil UE and LE Other Exercises: working on cervical extension while seated EOB and once returned to supine    Shoulder Instructions      Home Living                                          Prior Functioning/Environment                   OT Problem List:        OT Treatment/Interventions:      OT Goals(Current goals can be found in the care plan section) Acute Rehab OT Goals Patient Stated Goal: none stated, reports being comfortable end of session OT Goal Formulation: With patient Time For Goal Achievement: 04/30/20 Potential to Achieve Goals: Fair  OT Frequency: Min 2X/week (will benefit from 3x)   Barriers to D/C:            Co-evaluation              AM-PAC OT "6 Clicks" Daily Activity     Outcome Measure Help from another person eating meals?: Total Help from  another person taking care of personal grooming?: A Lot Help from another person toileting, which includes using toliet, bedpan, or urinal?: Total Help from another person bathing (including washing, rinsing, drying)?: Total Help from another person to put on and taking off regular upper body clothing?: Total Help from another person to put on and taking off regular lower body clothing?: Total 6 Click Score: 7   End of Session Equipment Utilized During Treatment: Oxygen Nurse Communication: Mobility status  Activity Tolerance: Patient tolerated treatment well Patient left: in bed;with call bell/phone within reach;with restraints reapplied  OT Visit Diagnosis: Unsteadiness on feet (R26.81);Other abnormalities of gait and mobility (R26.89);Muscle weakness (generalized) (M62.81);Other symptoms and signs involving cognitive function;Hemiplegia and hemiparesis Hemiplegia - Right/Left: Left Hemiplegia - caused by: Cerebral infarction                Time: 9622-2979 OT Time  Calculation (min): 42 min Charges:  OT General Charges $OT Visit: 1 Visit OT Evaluation $OT Re-eval: 1 Re-eval OT Treatments $Self Care/Home Management : 8-22 mins  Lou Cal, OT Acute Rehabilitation Services Pager [phone] Office [phone]   Raymondo Band 04/23/2020, 6:05 PM

## 2020-04-23 NOTE — Progress Notes (Signed)
Nephrology Follow-Up Consult note   Assessment/Recommendations: Justin Bonsignore. is a 69 y.o. male with a past medical history significant for Afib on eliquis, CHF, DMT2, CAD (s/p CABG 2007), HTN, and h/o esophageal cancer initially admitted on 10/31 for COVID-19 PNA and respiratory failure. Subsequently on 11/5 he had left-sided weakness with Right PCA occlusion with IR revascularization.  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 Hopefully kidneys will improve from contrast induced injury in the next 1-2 days, if they continue to worsen, he may need HD during this admission. BUN continued to rise from 144 to 173, possibly as a result of steroid therapy for COVID-19. Solumedrol decreased to 50 mg per primary team, hopefully will see improvement tomorrow. Continue to monitor UOP, miminal urine, but still making 360cc, 0.74mL/kg/hr. Recommend continued IVF, however due to mild hypernatremia and hyperchloremia, change to 1/2 NS at 125 ml/hr. -Continue to monitor daily Cr, Dose meds for GFR -Monitor Daily I/Os, Daily weight  -Maintain MAP>65 for optimal renal perfusion.  -Avoid nephrotoxic medications including NSAIDs and Vanc/Zosyn combo  Hypertension: mild hypertension 140-160s SBP. Acceptable, continue to monitor.  COVID-19 with acute respiratory failure: Extubated yesterday, maintaining sats on HFNC. Continuing with steroid therapy solumedrol 50mg  qd. Completed tocilizumab and remdesivir treatment. - per primary team  Anemia due to blood loss following procedure 11/5. Continue to monitor, hgb dropped from 11.9 to 10.8. Due to CAD, will require transfusion for hgb <8. Recommend daily monitoring, assessment for signs of bleeding. -Transfuse for Hgb<8 g/dL -No role for ESA in this setting  Diabetes Mellitus Type 2  with Hyperglycemia BG ranging from 125 to 295 in last 24 hours, unclear what prompted this change, perhaps increase in tube feeds? If so can consider bolus feeds with insulin coverage to help with this. Solumedrol dose decreased by 1/2 to 50 mg qd, expect this will help. Patient received total of 42U long acting insulin yesterday and 39 short acting. Lantus increased to 25u BID today. CTM.  H/o CAD, CABGx2, A. Fib on chronic anticoagulation therapy, combined systolic and diastolilc HF: Patient presently rate and rhythm controlled.  Medications: amiodarone 200 mg qd. Eliquis held, on heparin. - per primary team  H/o T3N0 adenocarcinoma of the GE junction: S/p carboplatin/paclitaxel From 5-11/2017. - follow up with oncology at discharge  Recommendations conveyed to primary service.   Gladys Damme, MD PGY-2 04/23/2020 9:41 AM  ___________________________________________________________  CC: AoCKD  Interval History/Subjective: patient is more alert, moving face and right hand, making sounds, but not oriented and not able to follow commands yet. I have not seen him move his left side.   Medications:  Current Facility-Administered Medications  Medication Dose Route Frequency Provider Last Rate Last Admin  . 0.9 %  sodium chloride infusion  250 mL Intravenous Continuous Whiteheart, Kathryn A, NP      . 0.9 %  sodium chloride infusion   Intravenous Continuous Rosita Fire, MD 125 mL/hr at 04/23/20 0900 Rate Verify at 04/23/20 0900  . acetaminophen (TYLENOL) tablet 650 mg  650 mg Oral Q4H PRN Deveshwar, Willaim Rayas, MD       Or  . acetaminophen (TYLENOL) 160 MG/5ML solution 650 mg  650 mg Per Tube Q4H PRN Luanne Bras, MD   650 mg at 04/22/20 0114   Or  . acetaminophen (TYLENOL) suppository 650  mg  650 mg Rectal Q4H PRN Deveshwar, Willaim Rayas, MD      . amiodarone (PACERONE) tablet 200 mg  200 mg Per Tube Daily Bhagat, Srishti L, MD   200 mg at 04/23/20 0904  . amLODipine (NORVASC)  tablet 10 mg  10 mg Per Tube Daily Bhagat, Srishti L, MD   10 mg at 04/23/20 0904  . aspirin chewable tablet 81 mg  81 mg Per Tube Daily Garvin Fila, MD   81 mg at 04/23/20 0912  . chlorhexidine (PERIDEX) 0.12 % solution 15 mL  15 mL Mouth Rinse BID Garvin Fila, MD      . Chlorhexidine Gluconate Cloth 2 % PADS 6 each  6 each Topical Daily Aroor, Lanice Schwab, MD   6 each at 04/22/20 0954  . docusate (COLACE) 50 MG/5ML liquid 100 mg  100 mg Per Tube BID Bhagat, Srishti L, MD   100 mg at 04/23/20 0905  . feeding supplement (JEVITY 1.2 CAL) liquid 1,000 mL  1,000 mL Per Tube Continuous Jacky Kindle, MD 75 mL/hr at 04/22/20 2241 1,000 mL at 04/22/20 2241  . feeding supplement (PROSource TF) liquid 45 mL  45 mL Per Tube Daily Jacky Kindle, MD   45 mL at 04/23/20 0912  . heparin ADULT infusion 100 units/mL (25000 units/270mL sodium chloride 0.45%)  850 Units/hr Intravenous Continuous Lavenia Atlas, RPH 9 mL/hr at 04/22/20 2218 900 Units/hr at 04/22/20 2218  . insulin aspart (novoLOG) injection 0-15 Units  0-15 Units Subcutaneous Q4H Jacky Kindle, MD   8 Units at 04/23/20 0830  . insulin aspart (novoLOG) injection 6 Units  6 Units Subcutaneous Q4H Jacky Kindle, MD   6 Units at 04/23/20 0829  . insulin glargine (LANTUS) injection 25 Units  25 Units Subcutaneous BID Jacky Kindle, MD   25 Units at 04/23/20 0918  . Ipratropium-Albuterol (COMBIVENT) respimat 1 puff  1 puff Inhalation BID Jonetta Osgood, MD   1 puff at 04/23/20 0817  . Ipratropium-Albuterol (COMBIVENT) respimat 1 puff  1 puff Inhalation Q6H PRN Ghimire, Henreitta Leber, MD      . MEDLINE mouth rinse  15 mL Mouth Rinse q12n4p Antony Contras S, MD      . methylPREDNISolone sodium succinate (SOLU-MEDROL) 125 mg/2 mL injection 50 mg  50 mg Intravenous Daily Jacky Kindle, MD   50 mg at 04/23/20 0910  . midazolam (VERSED) injection 2 mg  2 mg Intravenous Q2H PRN Marijean Heath, NP   2 mg at 04/22/20 0111  . nitroGLYCERIN  (NITROSTAT) SL tablet 0.4 mg  0.4 mg Sublingual Q5 min PRN Ghimire, Henreitta Leber, MD      . ondansetron (ZOFRAN) tablet 4 mg  4 mg Oral Q6H PRN Elwyn Reach, MD       Or  . ondansetron (ZOFRAN) injection 4 mg  4 mg Intravenous Q6H PRN Gala Romney L, MD      . pantoprazole sodium (PROTONIX) 40 mg/20 mL oral suspension 40 mg  40 mg Per Tube Daily Bhagat, Srishti L, MD   40 mg at 04/23/20 0902  . polyethylene glycol (MIRALAX / GLYCOLAX) packet 17 g  17 g Per Tube Daily Bhagat, Srishti L, MD   17 g at 04/23/20 0911  . rosuvastatin (CRESTOR) tablet 40 mg  40 mg Per Tube Daily Bhagat, Srishti L, MD   40 mg at 04/23/20 2297      Review of Systems: 10 systems reviewed and negative except per interval history/subjective  Physical Exam: Vitals:  04/23/20 0900 04/23/20 0904  BP: (!) 155/77 (!) 163/55  Pulse: 83   Resp: (!) 27   Temp:    SpO2: (!) 89%    Total I/O In: 775 [I.V.:625; NG/GT:150] Out: 50 [Urine:50]  Intake/Output Summary (Last 24 hours) at 04/23/2020 0941 Last data filed at 04/23/2020 0900 Gross per 24 hour  Intake 2165.48 ml  Output 360 ml  Net 1805.48 ml   Constitutional: older white gentleman, resting in bed, alert but not oriented, no acute distress ENMT: mucous membranes moister today, but still some cracking on lips CV: normal rate, no edema Respiratory: clear to auscultation, normal work of breathing Gastrointestinal: soft, non-tender, no palpable masses or hernias Skin: no visible lesions or rashes Psych: alert but not oriented, unable to assess mood  Test Results I personally reviewed new and old clinical labs and radiology tests Lab Results  Component Value Date   NA 146 (H) 04/23/2020   K 5.0 04/23/2020   CL 114 (H) 04/23/2020   CO2 19 (L) 04/23/2020   BUN 173 (H) 04/23/2020   CREATININE 5.86 (H) 04/23/2020   CALCIUM 8.2 (L) 04/23/2020   ALBUMIN 2.2 (L) 04/23/2020   PHOS 5.8 (H) 04/23/2020

## 2020-04-23 NOTE — Plan of Care (Signed)
  Problem: Nutrition: Goal: Adequate nutrition will be maintained Outcome: Progressing   Problem: Pain Managment: Goal: General experience of comfort will improve Outcome: Progressing   

## 2020-04-23 NOTE — Progress Notes (Signed)
Assisted tele visit to patient with wife.  Joycelin Radloff P, RN  

## 2020-04-24 ENCOUNTER — Telehealth: Payer: Self-pay | Admitting: Interventional Cardiology

## 2020-04-24 ENCOUNTER — Inpatient Hospital Stay (HOSPITAL_COMMUNITY): Payer: Medicare HMO

## 2020-04-24 DIAGNOSIS — N184 Chronic kidney disease, stage 4 (severe): Secondary | ICD-10-CM

## 2020-04-24 DIAGNOSIS — Z7189 Other specified counseling: Secondary | ICD-10-CM | POA: Diagnosis not present

## 2020-04-24 DIAGNOSIS — I639 Cerebral infarction, unspecified: Secondary | ICD-10-CM | POA: Diagnosis not present

## 2020-04-24 DIAGNOSIS — U071 COVID-19: Secondary | ICD-10-CM | POA: Diagnosis not present

## 2020-04-24 DIAGNOSIS — J9600 Acute respiratory failure, unspecified whether with hypoxia or hypercapnia: Secondary | ICD-10-CM | POA: Diagnosis not present

## 2020-04-24 LAB — POCT I-STAT 7, (LYTES, BLD GAS, ICA,H+H)
Acid-base deficit: 1 mmol/L (ref 0.0–2.0)
Bicarbonate: 30.1 mmol/L — ABNORMAL HIGH (ref 20.0–28.0)
Calcium, Ion: 1.25 mmol/L (ref 1.15–1.40)
HCT: 32 % — ABNORMAL LOW (ref 39.0–52.0)
Hemoglobin: 10.9 g/dL — ABNORMAL LOW (ref 13.0–17.0)
O2 Saturation: 81 %
Patient temperature: 97
Potassium: 6 mmol/L — ABNORMAL HIGH (ref 3.5–5.1)
Sodium: 147 mmol/L — ABNORMAL HIGH (ref 135–145)
TCO2: 33 mmol/L — ABNORMAL HIGH (ref 22–32)
pCO2 arterial: 91.5 mmHg (ref 32.0–48.0)
pH, Arterial: 7.12 — CL (ref 7.350–7.450)
pO2, Arterial: 60 mmHg — ABNORMAL LOW (ref 83.0–108.0)

## 2020-04-24 LAB — COMPREHENSIVE METABOLIC PANEL
ALT: 22 U/L (ref 0–44)
AST: 48 U/L — ABNORMAL HIGH (ref 15–41)
Albumin: 2 g/dL — ABNORMAL LOW (ref 3.5–5.0)
Alkaline Phosphatase: 84 U/L (ref 38–126)
Anion gap: 15 (ref 5–15)
BUN: 198 mg/dL — ABNORMAL HIGH (ref 8–23)
CO2: 16 mmol/L — ABNORMAL LOW (ref 22–32)
Calcium: 7.6 mg/dL — ABNORMAL LOW (ref 8.9–10.3)
Chloride: 110 mmol/L (ref 98–111)
Creatinine, Ser: 5.95 mg/dL — ABNORMAL HIGH (ref 0.61–1.24)
GFR, Estimated: 9 mL/min — ABNORMAL LOW (ref >60)
Glucose, Bld: 292 mg/dL — ABNORMAL HIGH (ref 70–99)
Potassium: 5.5 mmol/L — ABNORMAL HIGH (ref 3.5–5.1)
Sodium: 141 mmol/L (ref 135–145)
Total Bilirubin: 0.9 mg/dL (ref 0.3–1.2)
Total Protein: 4.4 g/dL — ABNORMAL LOW (ref 6.5–8.1)

## 2020-04-24 LAB — GLUCOSE, CAPILLARY
Glucose-Capillary: 231 mg/dL — ABNORMAL HIGH (ref 70–99)
Glucose-Capillary: 242 mg/dL — ABNORMAL HIGH (ref 70–99)
Glucose-Capillary: 257 mg/dL — ABNORMAL HIGH (ref 70–99)
Glucose-Capillary: 291 mg/dL — ABNORMAL HIGH (ref 70–99)

## 2020-04-24 LAB — MAGNESIUM: Magnesium: 3 mg/dL — ABNORMAL HIGH (ref 1.7–2.4)

## 2020-04-24 LAB — APTT
aPTT: 64 seconds — ABNORMAL HIGH (ref 24–36)
aPTT: 45 seconds — ABNORMAL HIGH (ref 24–36)

## 2020-04-24 LAB — CBC
HCT: 30.8 % — ABNORMAL LOW (ref 39.0–52.0)
Hemoglobin: 9.7 g/dL — ABNORMAL LOW (ref 13.0–17.0)
MCH: 25.9 pg — ABNORMAL LOW (ref 26.0–34.0)
MCHC: 31.5 g/dL (ref 30.0–36.0)
MCV: 82.1 fL (ref 80.0–100.0)
Platelets: 281 10*3/uL (ref 150–400)
RBC: 3.75 MIL/uL — ABNORMAL LOW (ref 4.22–5.81)
RDW: 18.8 % — ABNORMAL HIGH (ref 11.5–15.5)
WBC: 25.6 10*3/uL — ABNORMAL HIGH (ref 4.0–10.5)
nRBC: 0.2 % (ref 0.0–0.2)

## 2020-04-24 LAB — HEPARIN LEVEL (UNFRACTIONATED): Heparin Unfractionated: 2.18 IU/mL — ABNORMAL HIGH (ref 0.30–0.70)

## 2020-04-24 LAB — PHOSPHORUS: Phosphorus: 4.6 mg/dL (ref 2.5–4.6)

## 2020-04-24 MED ORDER — METHYLPREDNISOLONE SODIUM SUCC 40 MG IJ SOLR: 30.0000 mg | Freq: Every day | INTRAMUSCULAR | Status: DC

## 2020-04-24 MED ORDER — CALCIUM GLUCONATE-NACL 2-0.675 GM/100ML-% IV SOLN
2.0000 g | Freq: Once | INTRAVENOUS | Status: AC
  Administered 2020-04-24: 2000 mg via INTRAVENOUS
  Filled 2020-04-24: qty 100

## 2020-04-24 MED ORDER — PRISMASOL BGK 4/2.5 32-4-2.5 MEQ/L EC SOLN
Status: DC
  Filled 2020-04-24 (×6): qty 5000

## 2020-04-24 MED ORDER — VASOPRESSIN 20 UNITS/100 ML INFUSION FOR SHOCK
0.0000 [IU]/min | INTRAVENOUS | Status: DC
  Filled 2020-04-24: qty 100

## 2020-04-24 MED ORDER — NOREPINEPHRINE 4 MG/250ML-% IV SOLN
2.0000 ug/min | INTRAVENOUS | Status: DC
  Administered 2020-04-24: 2 ug/min via INTRAVENOUS
  Filled 2020-04-24: qty 750
  Filled 2020-04-24: qty 250

## 2020-04-24 MED ORDER — METHYLPREDNISOLONE SODIUM SUCC 40 MG IJ SOLR: 10.0000 mg | Freq: Every day | INTRAMUSCULAR | Status: DC

## 2020-04-24 MED ORDER — SODIUM BICARBONATE 8.4 % IV SOLN
100.0000 meq | Freq: Once | INTRAVENOUS | Status: AC
  Administered 2020-04-24: 100 meq via INTRAVENOUS
  Filled 2020-04-24: qty 100

## 2020-04-24 MED ORDER — SODIUM ZIRCONIUM CYCLOSILICATE 10 G PO PACK
10.0000 g | PACK | Freq: Once | ORAL | Status: AC
  Administered 2020-04-24: 10 g
  Filled 2020-04-24: qty 1

## 2020-04-24 MED ORDER — SODIUM CHLORIDE 0.9 % IV SOLN: 250.0000 mL | INTRAVENOUS | Status: DC

## 2020-04-24 MED ORDER — SODIUM CHLORIDE 0.9 % IV SOLN
3.0000 g | INTRAVENOUS | Status: DC
  Administered 2020-04-24: 3 g via INTRAVENOUS
  Filled 2020-04-24: qty 3

## 2020-04-24 MED ORDER — CALCIUM GLUCONATE-NACL 1-0.675 GM/50ML-% IV SOLN
INTRAVENOUS | Status: AC
  Filled 2020-04-24: qty 100

## 2020-04-24 MED ORDER — METHYLPREDNISOLONE SODIUM SUCC 40 MG IJ SOLR
40.0000 mg | Freq: Every day | INTRAMUSCULAR | Status: AC
  Administered 2020-04-24: 40 mg via INTRAVENOUS
  Filled 2020-04-24: qty 1

## 2020-04-24 MED ORDER — ETOMIDATE 2 MG/ML IV SOLN: 5.0000 mg | Freq: Once | INTRAVENOUS | Status: AC

## 2020-04-24 MED ORDER — PHENYLEPHRINE 40 MCG/ML (10ML) SYRINGE FOR IV PUSH (FOR BLOOD PRESSURE SUPPORT)
PREFILLED_SYRINGE | INTRAVENOUS | Status: AC
  Filled 2020-04-24: qty 10

## 2020-04-24 MED ORDER — PRISMASOL BGK 4/2.5 32-4-2.5 MEQ/L REPLACEMENT SOLN
Status: DC
  Filled 2020-04-24 (×2): qty 5000

## 2020-04-24 MED ORDER — HEPARIN SODIUM (PORCINE) 1000 UNIT/ML DIALYSIS: 1000.0000 [IU] | INTRAMUSCULAR | Status: DC | PRN

## 2020-04-24 MED ORDER — CALCIUM CHLORIDE 10 % IV SOLN
1.0000 g | Freq: Once | INTRAVENOUS | Status: AC
  Administered 2020-04-24: 1 g via INTRAVENOUS

## 2020-04-24 MED ORDER — SODIUM BICARBONATE 8.4 % IV SOLN
100.0000 meq | Freq: Once | INTRAVENOUS | Status: AC
  Administered 2020-04-24: 100 meq via INTRAVENOUS

## 2020-04-24 MED ORDER — CALCIUM CHLORIDE 10 % IV SOLN
INTRAVENOUS | Status: AC
  Administered 2020-04-24: 1000 mg
  Filled 2020-04-24: qty 10

## 2020-04-24 MED ORDER — CALCIUM CHLORIDE 10 % IV SOLN: 1.0000 g | Freq: Once | INTRAVENOUS | Status: AC

## 2020-04-24 MED ORDER — METHYLPREDNISOLONE SODIUM SUCC 40 MG IJ SOLR: 40.0000 mg | Freq: Every day | INTRAMUSCULAR | Status: DC

## 2020-04-24 MED ORDER — IPRATROPIUM-ALBUTEROL 0.5-2.5 (3) MG/3ML IN SOLN: 3.0000 mL | Freq: Two times a day (BID) | RESPIRATORY_TRACT | Status: DC

## 2020-04-24 MED ORDER — HEPARIN (PORCINE) 2000 UNITS/L FOR CRRT
INTRAVENOUS_CENTRAL | Status: DC | PRN
  Filled 2020-04-24: qty 1000

## 2020-04-24 MED ORDER — INSULIN GLARGINE 100 UNIT/ML ~~LOC~~ SOLN
30.0000 [IU] | Freq: Two times a day (BID) | SUBCUTANEOUS | Status: DC
  Administered 2020-04-24: 30 [IU] via SUBCUTANEOUS
  Filled 2020-04-24 (×2): qty 0.3

## 2020-04-24 MED ORDER — HEPARIN SODIUM (PORCINE) 1000 UNIT/ML DIALYSIS
1000.0000 [IU] | INTRAMUSCULAR | Status: DC | PRN
  Administered 2020-04-24: 2800 [IU] via INTRAVENOUS_CENTRAL
  Filled 2020-04-24 (×3): qty 6

## 2020-04-24 MED ORDER — VASOPRESSIN 20 UNITS/100 ML INFUSION FOR SHOCK
0.0000 [IU]/min | INTRAVENOUS | Status: DC
  Administered 2020-04-24 (×2): 0.03 [IU]/min via INTRAVENOUS
  Filled 2020-04-24: qty 100

## 2020-04-24 MED ORDER — ORAL CARE MOUTH RINSE
15.0000 mL | OROMUCOSAL | Status: DC
  Administered 2020-04-24 (×2): 15 mL via OROMUCOSAL

## 2020-04-24 MED ORDER — SODIUM BICARBONATE 8.4 % IV SOLN
100.0000 meq | Freq: Once | INTRAVENOUS | Status: AC
  Administered 2020-04-24: 100 meq via INTRAVENOUS
  Filled 2020-04-24 (×2): qty 100

## 2020-04-24 MED ORDER — ROCURONIUM BROMIDE 10 MG/ML (PF) SYRINGE: 100.0000 mg | PREFILLED_SYRINGE | Freq: Once | INTRAVENOUS | Status: AC

## 2020-04-24 MED ORDER — NOREPINEPHRINE 16 MG/250ML-% IV SOLN
0.0000 ug/min | INTRAVENOUS | Status: DC
  Administered 2020-04-24: 60 ug/min via INTRAVENOUS
  Filled 2020-04-24 (×6): qty 250

## 2020-04-24 MED ORDER — METHYLPREDNISOLONE SODIUM SUCC 40 MG IJ SOLR: 20.0000 mg | Freq: Every day | INTRAMUSCULAR | Status: DC

## 2020-04-24 MED ORDER — ETOMIDATE 2 MG/ML IV SOLN
INTRAVENOUS | Status: AC
  Administered 2020-04-24: 10 mg
  Filled 2020-04-24: qty 10

## 2020-04-24 MED ORDER — HYDROCORTISONE NA SUCCINATE PF 100 MG IJ SOLR
50.0000 mg | Freq: Four times a day (QID) | INTRAMUSCULAR | Status: DC
  Administered 2020-04-24: 50 mg via INTRAVENOUS
  Filled 2020-04-24: qty 2

## 2020-04-24 MED ORDER — ROCURONIUM BROMIDE 10 MG/ML (PF) SYRINGE
PREFILLED_SYRINGE | INTRAVENOUS | Status: AC
  Administered 2020-04-24: 100 mg
  Filled 2020-04-24: qty 10

## 2020-04-24 MED ORDER — CHLORHEXIDINE GLUCONATE 0.12% ORAL RINSE (MEDLINE KIT): 15.0000 mL | Freq: Two times a day (BID) | OROMUCOSAL | Status: DC

## 2020-04-25 MED FILL — Medication: Qty: 1 | Status: AC

## 2020-04-25 NOTE — Telephone Encounter (Signed)
Pt noted to have passed away yesterday at the hospital.

## 2020-04-25 NOTE — Telephone Encounter (Signed)
Damned

## 2020-05-15 NOTE — Progress Notes (Signed)
NAME:  Justin Blackburn., MRN:  [id number], DOB:  10/22/1950, LOS: 75 ADMISSION DATE:  04/08/2020, CONSULTATION DATE:  11/5 REFERRING MD:  Sloan Leiter, CHIEF COMPLAINT:  Vent mgmt    Brief History   69 y o male with hx AFib on Eliquis, CHF, DM, CAD (CABG 2007), HTN, esophageal cancer initially admitted 10/31 with COVID PNA and respiratory failure.  On 11/5 he was noted to have L sided weakness and w/u revealed R PCA occlusion and he was taken for IR revascularization.    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   Significant Hospital Events     Consults:  Neuro PCCM  Procedures:  IR revascularization R PCA occlusion 11/5 ETT 11/5>>>  Significant Diagnostic Tests:  CT head/ CTA head/neck 11/5>>> 1. Occlusion right posterior cerebral artery which appears acute.  2. Moderate stenosis distal right vertebral artery and severe stenosis distal left vertebral artery. Moderate stenosis proximal right vertebral artery origin and mild stenosis origin of left vertebral artery  3. No significant carotid stenosis in the neck. Mild cavernous carotid stenosis on the right and moderate left cavernous carotid Stenosis  4. 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 5. Both middle cerebral arteries are patent without significant Stenosis.  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 Interval correction/resolution of previously seen proximal right PCA  occlusion. Severe atheromatous disease seen throughout the underlying right P2 and distal right PCA branches. 2. Extensive atheromatous disease involving the anterior cerebral arteries bilaterally, right worse than left, with severe multifocal right A2 and A3 stenoses. 3. Moderate approximate 50% stenosis involving the cavernous left ICA. 4. Moderate to severe bilateral V4 stenoses as above.   Micro Data:  BC x2 10/31>>> neg  Urine 10/31>>> insig growth  COVID 10/31>>> POS   Antimicrobials:    Interim history/subjective:  Patient's BUN and creatinine continue to trend up, despite IV fluid therapy Remains on high flow nasal cannula with O2 sat between 88 and 92%  Objective   Blood pressure (!) 101/48, pulse 63, temperature (!) 97 F (36.1 C), temperature source Axillary, resp. rate (!) 27, height 6' (1.829 m), weight 90.1 kg, SpO2 97 %.        Intake/Output Summary (Last 24 hours) at 2020-05-21 1034 Last data filed at May 21, 2020 1000 Gross per 24 hour  Intake 5857.29 ml  Output 440 ml  Net 5417.29 ml   Filed Weights   04/20/2020 2115 04/22/20 1000 04/23/20 1300  Weight: 89 kg 86.4 kg 90.1 kg    Examination: General: Chronically ill appearing male, on high flow nasal cannula HENT: Atraumatic, normocephalic, moist mucous membranes, NGT in place Lungs: Fine crackles at bases bilaterally, no wheezes or rhonchi Cardiovascular: Irregularly irregular, no murmur or gallop Abdomen: soft, bowel sounds present Extremities: warm and dry, no edema  Neuro: Opens eyes with vocal stimuli, following commands on right side, plegic on left upper extremity, left lower extremity 2/5.  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 Fib Patient's heart rate is well controlled Holding eliquis for now, continue heparin infusion for secondary stroke prophylaxis Continue amiodarone   HTN  Goal SBP 120-160 Continue amlodipine and HCTZ  Poorly controlled DM with hyperglycemia Fingerstick glucose is about 250 Increase Lantus to 30 units twice Blackburn Continue sliding scale and tube feed coverage and sliding scale insulin  AKI on CKD stage IV Patient's BUN and serum creatinine continue to trend up, now BUN reaching in 190s with change in mental status could be due to uremic encephalopathy Nephrology recommendations appreciated, they recommend starting CRRT Spoke with patient's wife she would like to speak with her family before making any decision  Patient with left-sided plegia and worsening renal failure. Palliative care consult is pending  Best practice:  Diet: NPO, tube feeds Pain/Anxiety/Delirium protocol (if indicated): As needed fentanyl VAP protocol (if indicated): Yes DVT prophylaxis: IV heparin infusion GI prophylaxis: PPI Glucose control: SSI/basal Mobility: BR Code Status: full Family Communication: Patient's wife was updated over the phone Disposition: ICU  Labs   CBC: Recent Labs  Lab 04/18/20 0233 04/18/20 0233 04/17/2020 0024 05/13/2020 2151 04/20/20 0434 04/20/20 0434 04/21/20 0555 04/21/20 1205 04/21/20 2013 04/22/20 0544 04/22/20 0939 04/23/20 0547 05-09-2020 0407  WBC 14.9*   < > 14.2*  --  18.6*  --  19.7*  --   --  18.9*  --   20.8* 25.6*  NEUTROABS 13.7*  --  12.9*  --   --   --   --   --   --   --   --   --   --   HGB 12.6*   < > 13.7   < > 10.0*   < > 11.8*   < > 11.9* 11.3* 11.9* 10.8* 9.7*  HCT 38.3*   < > 42.8   < > 32.9*   < > 36.3*   < > 35.0* 35.8* 35.0* 33.9* 30.8*  MCV 80.5   < > 80.8  --  85.7  --  80.8  --   --  81.2  --  82.7 82.1  PLT 380   < > 397  --  363  --  361  --   --  334  --  302 281   < > = values in this interval not displayed.    Basic Metabolic Panel: Recent Labs  Lab 05/08/2020 0024 05/09/2020 2151 04/20/20 1332 04/20/20 1332 04/21/20 0555 04/21/20 1205 04/21/20 2013 04/22/20 0544 04/22/20 0939 04/22/20 1811 04/23/20 0547 04/23/20 1751 05-09-20 0407  NA 141   < > 139   < > 142   < > 143 143 144  --  146*  --  141  K 3.8   < > 4.9   < > 4.3   < > 4.7 4.7 4.7  --  5.0  --  5.5*  CL 105   < >  108  --  110  --   --  110  --   --  114*  --  110  CO2 23   < > 19*  --  19*  --   --  20*  --   --  19*  --  16*  GLUCOSE 149*   < > 261*  --  206*  --   --  188*  --   --  295*  --  292*  BUN 78*   < > 87*  --  110*  --   --  144*  --   --  173*  --  198*  CREATININE 2.61*   < > 3.36*  --  4.62*  --   --  5.73*  --   --  5.86*  --  5.95*  CALCIUM 8.9   < > 7.7*  --  8.2*  --   --  8.2*  --   --  8.2*  --  7.6*  MG 2.0  --   --   --   --   --   --   --   --  2.8* 3.0* 2.9* 3.0*  PHOS  --   --   --   --   --   --   --   --   --  5.7* 5.8* 5.1* 4.6   < > = values in this interval not displayed.   GFR: Estimated Creatinine Clearance: 12.5 mL/min (A) (by C-G formula based on SCr of 5.95 mg/dL (H)). Recent Labs  Lab 04/21/20 0555 04/22/20 0544 04/23/20 0547 May 05, 2020 0407  WBC 19.7* 18.9* 20.8* 25.6*    Liver Function Tests: Recent Labs  Lab 04/20/20 0434 04/21/20 0555 04/22/20 0544 04/23/20 0547 May 05, 2020 0407  AST 21 20 22 29  48*  ALT 14 11 8 10 22   ALKPHOS 66 77 79 72 84  BILITOT 0.7 0.6 0.4 0.5 0.9  PROT 4.6* 5.0* 5.0* 4.8* 4.4*  ALBUMIN 2.0* 2.4* 2.3* 2.2* 2.0*   No  results for input(s): LIPASE, AMYLASE in the last 168 hours. No results for input(s): AMMONIA in the last 168 hours.  ABG    Component Value Date/Time   PHART 7.369 04/22/2020 0939   PCO2ART 37.3 04/22/2020 0939   PO2ART 63 (L) 04/22/2020 0939   HCO3 21.4 04/22/2020 0939   TCO2 23 04/22/2020 0939   ACIDBASEDEF 3.0 (H) 04/22/2020 0939   O2SAT 91.0 04/22/2020 0939     Coagulation Profile: No results for input(s): INR, PROTIME in the last 168 hours.  Cardiac Enzymes: No results for input(s): CKTOTAL, CKMB, CKMBINDEX, TROPONINI in the last 168 hours.  HbA1C: Hgb A1c MFr Bld  Date/Time Value Ref Range Status  04/20/2020 04:34 AM 7.4 (H) 4.8 - 5.6 % Final    Comment:    (NOTE) Pre diabetes:          5.7%-6.4%  Diabetes:              >6.4%  Glycemic control for   <7.0% adults with diabetes   04/15/2020 04:50 AM 7.0 (H) 4.8 - 5.6 % Final    Comment:    (NOTE) Pre diabetes:          5.7%-6.4%  Diabetes:              >6.4%  Glycemic control for   <7.0% adults with diabetes     CBG: Recent Labs  Lab 04/23/20 1311 04/23/20 1531 04/23/20 1948 05/05/20 0008  05-16-2020 0400  GLUCAP 306* 303* 258* 291* 242*    Past Medical History  He,  has a past medical history of A-fib ([place]), Acute kidney injury ([place]), Anemia, Angina, Arthritis, Atrial flutter, paroxysmal ([place]), Cancer (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   Surgical History    Past Surgical History:  Procedure Laterality Date  . CARDIOVERSION N/A 01/29/2015   Procedure: CARDIOVERSION;  Surgeon: [name], MD;  Location: [facility];  Service:  Cardiovascular;  Laterality: N/A;  . CARDIOVERSION N/A 04/26/2018   Procedure: CARDIOVERSION;  Surgeon: Josue Hector, MD;  Location: [facility] ENDOSCOPY;  Service: Cardiovascular;  Laterality: N/A;  . CORONARY ANGIOPLASTY WITH STENT PLACEMENT  08/31/11   "2 today; makes total of 3 stents"  . CORONARY ANGIOPLASTY WITH STENT PLACEMENT  08/10/2017   "2 today; makes a total of 5 stents" (08/10/2017)  . CORONARY ARTERY BYPASS GRAFT  1998; 2007   CABG X 4; CABG X4  . CORONARY STENT INTERVENTION N/A 08/10/2017   Procedure: CORONARY STENT INTERVENTION;  Surgeon: Sherren Mocha, MD;  Location: [facility] CV LAB;  Service: Cardiovascular;  Laterality: N/A;  . ESOPHAGOGASTRODUODENOSCOPY (EGD) WITH PROPOFOL N/A 09/01/2017   Procedure: ESOPHAGOGASTRODUODENOSCOPY (EGD) WITH PROPOFOL;  Surgeon: Laurence Spates, MD;  Location: [facility];  Service: Endoscopy;  Laterality: N/A;  . EUS N/A 09/29/2017   Procedure: UPPER ENDOSCOPIC ULTRASOUND (EUS) LINEAR;  Surgeon: Arta Silence, MD;  Location: [facility] ENDOSCOPY;  Service: Endoscopy;  Laterality: N/A;  . LEFT HEART CATH AND CORS/GRAFTS ANGIOGRAPHY N/A 08/10/2017   Procedure: LEFT HEART CATH AND CORS/GRAFTS ANGIOGRAPHY;  Surgeon: Sherren Mocha, MD;  Location: [facility] CV LAB;  Service: Cardiovascular;  Laterality: N/A;  . LEFT HEART CATHETERIZATION WITH CORONARY/GRAFT ANGIOGRAM N/A 08/31/2011   Procedure: LEFT HEART CATHETERIZATION WITH Beatrix Fetters;  Surgeon: [name], MD;  Location: [facility] CATH LAB;  Service: Cardiovascular;  Laterality: N/A;  . PERIPHERAL VASCULAR CATHETERIZATION N/A 04/27/2016   Procedure: Abdominal Aortogram w/Lower Extremity;  Surgeon: Angelia Mould, MD;  Location: [facility] CV LAB;  Service: Cardiovascular;  Laterality: N/A;  . RADIOLOGY WITH ANESTHESIA N/A 04/18/2020   Procedure: IR WITH ANESTHESIA;  Surgeon: Radiologist, Medication, MD;  Location: [facility];  Service: Radiology;  Laterality: N/A;  . TEE WITHOUT  CARDIOVERSION N/A 01/29/2015   Procedure: TRANSESOPHAGEAL ECHOCARDIOGRAM (TEE);  Surgeon: [name], MD;  Location: [facility] ENDOSCOPY;  Service: Cardiovascular;  Laterality: N/A;     Social History   reports that he quit smoking about 48 years ago. His smoking use included cigarettes. He has a 12.00 pack-year smoking history. He has never used smokeless tobacco. He reports that he does not drink alcohol and does not use drugs.   Family History   His family history includes Diabetes in his father and mother; Heart attack in his brother, father, and mother; Heart disease in his father and mother. There is no history of Stroke.   Allergies No Known Allergies   Home Medications  Prior to Admission medications   Medication Sig Start Date End Date Taking? Authorizing Provider  amiodarone (PACERONE) 200 MG tablet TAKE 1 TABLET BY MOUTH EVERY DAY Patient taking differently: Take 200 mg by mouth Blackburn.  03/21/20  Yes Belva Crome, MD  ELIQUIS 5 MG  TABS tablet TAKE 1 TABLET BY MOUTH TWICE A DAY 04/18/2020   Belva Crome, MD  gabapentin (NEURONTIN) 300 MG capsule Take 300 mg by mouth 2 (two) times Blackburn.    Yes [provider]  insulin degludec (TRESIBA FLEXTOUCH) 100 UNIT/ML FlexTouch Pen Inject 30 Units into the skin Blackburn.    Yes [provider]  losartan (COZAAR) 50 MG tablet Take 1 tablet (50 mg total) by mouth Blackburn. 03/14/20  Yes Belva Crome, MD  metFORMIN (GLUCOPHAGE) 1000 MG tablet Take 1,000 mg by mouth 2 (two) times Blackburn with a meal. 05/19/18  Yes [provider]  nitroGLYCERIN (NITROSTAT) 0.4 MG SL tablet PLACE 1 TABLET UNDER THE TONGUE EVERY 5 (FIVE) MINUTES AS NEEDED FOR CHEST PAIN (ANGINA). Patient taking differently: Place 0.4 mg under the tongue every 5 (five) minutes as needed for chest pain.  01/23/20  Yes Belva Crome, MD  pantoprazole (PROTONIX) 40 MG tablet TAKE 1 TABLET BY MOUTH EVERY DAY Patient taking differently: Take 40 mg by mouth [place].   10/23/19  Yes Belva Crome, MD  rosuvastatin (CRESTOR) 40 MG tablet Take 1 tablet (40 mg total) by mouth Blackburn. 04/20/19 05/14/20 Yes Belva Crome, MD    Total critical care time: 39 minutes  Performed by: [place] care time was exclusive of separately billable procedures and treating other patients.   Critical care was necessary to treat or prevent imminent or life-threatening deterioration.   Critical care was time spent personally by me on the following activities: development of treatment plan with patient and/or surrogate as well as nursing, discussions with consultants, evaluation of patient's response to treatment, examination of patient, obtaining history from patient or surrogate, ordering and performing treatments and interventions, ordering and review of laboratory studies, ordering and review of radiographic studies, pulse oximetry and re-evaluation of patient's condition.   Jacky Kindle MD Critical care physician [facility]  Pager: [phone] Mobile: [phone]

## 2020-05-15 NOTE — Telephone Encounter (Signed)
Spoke with wife and she was so upset about everything going on with her husband.  He's currently hospitalized with Covid and stroke.  Wife states the hospital contacted her about the need for dialysis and she just doesn't know what to do.  She states Dr. Tamala Julian is the only doctor that pt ever truly trusted.  Wife would like for Dr. Tamala Julian to review hospital records and get his thoughts on dialysis.  I did mention to wife that his kidney function is quite elevated and at dialysis level.  Wife would just really appreciate any insight from Dr. Tamala Julian because they trust him so much.  She is aware that Dr. Tamala Julian is on vacation and may not respond today.  She is ok with this because Dr. Tamala Julian knows pt best.

## 2020-05-15 NOTE — Procedures (Signed)
Arterial Catheter Insertion Procedure Note  Justin Blackburn  [id number]  01-22-1951  Date:12-May-2020  Time:4:26 PM    Provider Performing: Jacky Kindle    Procedure: Insertion of Arterial Line [phone]) with US guidance ([id number])   Indication(s) Blood pressure monitoring and/or need for frequent ABGs  Consent Risks of the procedure as well as the alternatives and risks of each were explained to the patient and/or caregiver.  Consent for the procedure was obtained and is signed in the bedside chart  Anesthesia None   Time Out Verified patient identification, verified procedure, site/side was marked, verified correct patient position, special equipment/implants available, medications/allergies/relevant history reviewed, required imaging and test results available.   Sterile Technique Maximal sterile technique including full sterile barrier drape, hand hygiene, sterile gown, sterile gloves, mask, hair covering, sterile ultrasound probe cover (if used).   Procedure Description Area of catheter insertion was cleaned with chlorhexidine and draped in sterile fashion. With real-time ultrasound guidance an arterial catheter was placed into the left Axillary artery.  Appropriate arterial tracings confirmed on monitor.     Complications/Tolerance None; patient tolerated the procedure well.   EBL Minimal   Specimen(s) None

## 2020-05-15 NOTE — Death Summary Note (Signed)
DEATH SUMMARY   Patient Details  Name: Justin Blackburn. 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 - s/p IR revascularization right PCA with TICI 3 Severe stenosis of left vertebrobasilar junction status post balloon angioplasty Chronic atrial fibrillation Poorly controlled DM with hyperglycemia Acute oliguric renal failure on CKD stage IV Hypertension  Diagnoses  Preliminary cause of death:  Secondary Diagnoses (including complications and co-morbidities):  Principal Problem:   Acute respiratory failure due to COVID-19 [facility]) Active Problems:   Hypertension   Peripheral vascular disease ([place])   Chronic combined systolic and diastolic heart failure, NYHA class 2 (HCC)   DVT (deep venous thrombosis) (HCC)   Diabetes mellitus type 2, insulin dependent (HCC)   Diffuse interstitial pulmonary disease (HCC)   AF (paroxysmal atrial fibrillation) (HCC)   CKD (chronic kidney disease), stage IV ([place])   Posterior cerebral artery embolism, right   Advanced care planning/counseling discussion   Goals of care, counseling/discussion   Brief Hospital Course (including significant findings, care, treatment, and services provided and events leading to death)  [name]. is a 69 y.o. year old male who with hx AFib on Eliquis, DM, CAD (CABG 09-06-05), HTN, esophageal cancer initially admitted 2023/04/17 with COVID PNA and respiratory failure.  On 11/5 he was noted to have L sided weakness and w/u revealed R PCA occlusion and he was taken for IR revascularization.  Patient was  admitted to neuroscience ICU, Eliquis was held initially by neurology, he was continued on secondary stroke prophylaxis medications, after 48 hours patient was restarted on anticoagulation with IV heparin infusion.  He was extubated and was put on high flow nasal cannula.  Patient serum creatinine continue to get worse and he became oliguric, nephrology consult was requested they suggest this AKI on CKD stage IV is likely due to contrast-induced nephropathy.  Patient will require hemodialysis.  Patient's wife was contacted, initially she needed some time to discuss with the family, while in between patient started having aspiration, he became hypotensive and hypoxic requiring endotracheal intubation and start of vasopressors.  Hemodialysis catheter was placed but patient started getting severely hypotensive requiring Levophed and vasopressin at maximum dose.  He was started on stress dose steroid and IV antibiotic.  Patient condition got worse and he died on 2020-04-26 at 5:04 PM   Pertinent Labs and Studies  Significant Diagnostic Studies EEG  Result Date: 04/20/2020 Lora Havens, MD     04/20/2020  7:11 PM Patient Name: Justin Blackburn. MRN: [id number] Epilepsy Attending: Lora Havens Referring Physician/Provider: Dr Kerney Elbe Date: 04/20/2020 Duration: 26.13 mins Patient history: 69 year old malewithacute right PCA stroke s/p complete revascularization of RT PCA achieving a TICI 3 revascularization,S/P Balloon angioplasty of severe stenosis of Lt VBJ with a 3.37mm x 15 mm OTW balloon achieving a 60-70% patency. EEG to evaluate for seizure. Level of alertness: Lethargic AEDs during EEG study: None Technical aspects: This EEG study was done with scalp electrodes positioned according to the 10-20 International system of electrode placement. Electrical activity was acquired at a sampling rate of 500Hz  and  reviewed with a high frequency filter of 70Hz  and a low frequency filter of 1Hz . EEG data  were recorded continuously and digitally stored. Description: No posterior dominant rhythm was seen. EEG showed continuous generalized polymorphic 3 to 6 Hz theta-delta slowing, at times with triphasic morphology. Hyperventilation and photic stimulation were not performed.   ABNORMALITY -Continuous slow, generalized IMPRESSION: This study is suggestive of moderate to severe diffuse encephalopathy, nonspecific etiology. No seizures or epileptiform discharges were seen throughout the recording. [name]   CT ABDOMEN PELVIS WO CONTRAST  Result Date: 04/02/2020 CLINICAL DATA:  Esophageal cancer. Gastrointestinal cancer. Chemo radiation completed. EXAM: CT ABDOMEN AND PELVIS WITHOUT CONTRAST TECHNIQUE: Multidetector CT imaging of the abdomen and pelvis was performed following the standard protocol without IV contrast. COMPARISON:  CT 09/26/2019 FINDINGS: Lower chest: Lung bases are clear. Hepatobiliary: No focal hepatic lesion. No biliary duct dilatation. Common bile duct is normal. Pancreas: Pancreas is normal. No ductal dilatation. No pancreatic inflammation. Spleen: Normal spleen Adrenals/urinary tract: Adrenal glands and kidneys are normal. The ureters and bladder normal. Stomach/Bowel: Coarse calcifications along the distal esophagus and GE junction unchanged from prior. No nodularity or mass. No gastrohepatic ligament adenopathy. Stomach, duodenum and small bowel are normal. Appendix cecum normal. The colon and rectosigmoid colon are normal. Vascular/Lymphatic: Abdominal aorta is normal caliber with atherosclerotic calcification. There is no retroperitoneal or periportal lymphadenopathy. No pelvic lymphadenopathy. Reproductive: Prostate normal Other: No free fluid. Musculoskeletal: Short expansile lesion of the RIGHT tenth rib is unchanged consistent with a healed fracture. IMPRESSION: 1. No evidence esophageal carcinoma recurrence. 2. Coarse calcifications through the GE junction not changed from  prior. 3. No evidence of gastrohepatic ligament lymphadenopathy. 4. No evidence of liver metastasis on noncontrast exam. 5. Aortic Atherosclerosis (ICD10-I70.0). Electronically Signed   By: Suzy Bouchard M.D.   On: 04/02/2020 08:40   CT Code Stroke CTA Head W/WO contrast  Result Date: 05/06/2020 CLINICAL DATA:  Acute neuro deficit.  Left-sided weakness. EXAM: CT ANGIOGRAPHY HEAD AND NECK TECHNIQUE: Multidetector CT imaging of the head and neck was performed using the standard protocol during bolus administration of intravenous contrast. Multiplanar CT image reconstructions and MIPs were obtained to evaluate the vascular anatomy. Carotid stenosis measurements (when applicable) are obtained utilizing NASCET criteria, using the distal internal carotid diameter as the denominator. CONTRAST:  49mL OMNIPAQUE IOHEXOL 350 MG/ML SOLN COMPARISON:  CT head 04/18/2020 FINDINGS: CTA NECK FINDINGS Aortic arch: Atherosclerotic calcification aortic arch without aneurysm or dissection. Atherosclerotic disease in the proximal great vessels without flow limiting stenosis. Right carotid system: Mild atherosclerotic calcification right carotid bifurcation without stenosis. Mild atherosclerotic calcification right cervical internal carotid artery without stenosis. Left carotid system: Atherosclerotic disease left carotid bifurcation. Less than 50% diameter stenosis proximal left internal carotid artery. Moderate stenosis proximal left external carotid artery Vertebral arteries: Moderately severe calcific stenosis origin of right vertebral artery. Moderate stenosis distal right vertebral artery at the C1 level. Mild atherosclerotic disease origin of left vertebral artery due to atherosclerotic plaque. Moderate to severe stenosis distal left vertebral artery at the skull base. Skeleton: No acute skeletal abnormality. Other neck: Negative for mass or adenopathy in the neck. Upper chest: Diffuse airspace disease in the lung apices  bilaterally with mosaic pattern. Possible pulmonary edema or infection. Review of the MIP images confirms the above findings CTA HEAD FINDINGS Anterior circulation: Atherosclerotic calcification throughout the cavernous carotid bilaterally. Mild stenosis on the right and moderate stenosis on the left. A1 segments patent bilaterally. Atherosclerotic disease in the  anterior cerebral arteries bilaterally with multiple areas of severe stenosis in the right A2 and A3 segments. Milder disease in the left anterior cerebral artery. Middle cerebral arteries patent bilaterally without flow-limiting stenosis or large vessel occlusion. Posterior circulation: Moderate stenosis distal right vertebral artery and moderate to severe stenosis distal left vertebral artery. Atherosclerotic plaque in the proximal basilar with mild stenosis. Superior cerebellar arteries patent bilaterally. Left posterior cerebral artery patent with mild atherosclerotic stenosis proximally Occlusion of the right posterior cerebral artery at the P1/P2 junction. This appears acute. Venous sinuses: Minimal venous contrast due to arterial phase scanning Anatomic variants: None Review of the MIP images confirms the above findings IMPRESSION: 1. Occlusion right posterior cerebral artery which appears acute. 2. Moderate stenosis distal right vertebral artery and severe stenosis distal left vertebral artery. Moderate stenosis proximal right vertebral artery origin and mild stenosis origin of left vertebral artery 3. No significant carotid stenosis in the neck. Mild cavernous carotid stenosis on the right and moderate left cavernous carotid stenosis 4. 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 5. Both middle cerebral arteries are patent without significant stenosis. 6. These results were called by telephone at the time of interpretation on 04/23/2020 at 5:23 pm to provider [facility] , who verbally acknowledged these results.  Electronically Signed   By: Franchot Gallo M.D.   On: 04/23/2020 17:25   CT Code Stroke CTA Neck W/WO contrast  Result Date: 04/22/2020 CLINICAL DATA:  Acute neuro deficit.  Left-sided weakness. EXAM: CT ANGIOGRAPHY HEAD AND NECK TECHNIQUE: Multidetector CT imaging of the head and neck was performed using the standard protocol during bolus administration of intravenous contrast. Multiplanar CT image reconstructions and MIPs were obtained to evaluate the vascular anatomy. Carotid stenosis measurements (when applicable) are obtained utilizing NASCET criteria, using the distal internal carotid diameter as the denominator. CONTRAST:  16mL OMNIPAQUE IOHEXOL 350 MG/ML SOLN COMPARISON:  CT head 04/29/2020 FINDINGS: CTA NECK FINDINGS Aortic arch: Atherosclerotic calcification aortic arch without aneurysm or dissection. Atherosclerotic disease in the proximal great vessels without flow limiting stenosis. Right carotid system: Mild atherosclerotic calcification right carotid bifurcation without stenosis. Mild atherosclerotic calcification right cervical internal carotid artery without stenosis. Left carotid system: Atherosclerotic disease left carotid bifurcation. Less than 50% diameter stenosis proximal left internal carotid artery. Moderate stenosis proximal left external carotid artery Vertebral arteries: Moderately severe calcific stenosis origin of right vertebral artery. Moderate stenosis distal right vertebral artery at the C1 level. Mild atherosclerotic disease origin of left vertebral artery due to atherosclerotic plaque. Moderate to severe stenosis distal left vertebral artery at the skull base. Skeleton: No acute skeletal abnormality. Other neck: Negative for mass or adenopathy in the neck. Upper chest: Diffuse airspace disease in the lung apices bilaterally with mosaic pattern. Possible pulmonary edema or infection. Review of the MIP images confirms the above findings CTA HEAD FINDINGS Anterior circulation:  Atherosclerotic calcification throughout the cavernous carotid bilaterally. Mild stenosis on the right and moderate stenosis on the left. A1 segments patent bilaterally. Atherosclerotic disease in the anterior cerebral arteries bilaterally with multiple areas of severe stenosis in the right A2 and A3 segments. Milder disease in the left anterior cerebral artery. Middle cerebral arteries patent bilaterally without flow-limiting stenosis or large vessel occlusion. Posterior circulation: Moderate stenosis distal right vertebral artery and moderate to severe stenosis distal left vertebral artery. Atherosclerotic plaque in the proximal basilar with mild stenosis. Superior cerebellar arteries patent bilaterally. Left posterior cerebral artery patent with mild atherosclerotic stenosis proximally Occlusion of the  right posterior cerebral artery at the P1/P2 junction. This appears acute. Venous sinuses: Minimal venous contrast due to arterial phase scanning Anatomic variants: None Review of the MIP images confirms the above findings IMPRESSION: 1. Occlusion right posterior cerebral artery which appears acute. 2. Moderate stenosis distal right vertebral artery and severe stenosis distal left vertebral artery. Moderate stenosis proximal right vertebral artery origin and mild stenosis origin of left vertebral artery 3. No significant carotid stenosis in the neck. Mild cavernous carotid stenosis on the right and moderate left cavernous carotid stenosis 4. 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 5. Both middle cerebral arteries are patent without significant stenosis. 6. These results were called by telephone at the time of interpretation on 05/05/2020 at 5:23 pm to provider [facility] , who verbally acknowledged these results. Electronically Signed   By: Franchot Gallo M.D.   On: 04/23/2020 17:25   MR ANGIO HEAD WO CONTRAST  Result Date: 04/20/2020 CLINICAL DATA:  Follow-up examination for acute  stroke. EXAM: MRI HEAD WITHOUT CONTRAST MRA HEAD WITHOUT CONTRAST TECHNIQUE: Multiplanar, multiecho pulse sequences of the brain and surrounding structures were obtained without intravenous contrast. Angiographic images of the head were obtained using MRA technique without contrast. COMPARISON:  Prior CTs from 04/18/2020. FINDINGS: MRI HEAD FINDINGS Brain: Generalized age-related cerebral atrophy. Patchy T2/FLAIR hyperintensity within the periventricular and deep white matter both cerebral hemispheres most consistent with chronic small vessel ischemic disease, moderate in nature. Patchy restricted diffusion involving the parasagittal right frontal and parietal lobes, consistent with an acute ischemic right ACA territory infarct. Additional patchy diffusion abnormality seen involving the parasagittal right occipital lobe and right thalamus, consistent with right PCA territory infarct as well. No associated mass effect. Scattered areas of associated petechial hemorrhage without hemorrhagic transformation. No other evidence for acute or subacute ischemia. Gray-white matter differentiation otherwise maintained. No other areas of chronic cortical infarction. No other evidence for acute or chronic intracranial hemorrhage. No mass lesion, midline shift or mass effect. No hydrocephalus or extra-axial fluid collection. Partially empty sella noted. Midline structures intact. Vascular: Major intracranial vascular flow voids are maintained. Skull and upper cervical spine: Craniocervical junction within normal limits. Bone marrow signal intensity normal. No scalp soft tissue abnormality. Sinuses/Orbits: Patient status post bilateral ocular lens replacement. Globes and orbital soft tissues demonstrate no acute finding. Scattered mucosal thickening noted throughout the paranasal sinuses. Fluid seen within the nasopharynx. Patient is intubated. Other: None. MRA HEAD FINDINGS ANTERIOR CIRCULATION: Visualized distal cervical segments  of the internal carotid arteries are patent with antegrade flow. Petrous segments widely patent. Scattered atheromatous irregularity throughout the carotid siphons bilaterally, with associated moderate approximate 50% stenosis at the cavernous left ICA (series 10, image 97). No more than mild stenosis on the right. A1 segments patent bilaterally. Normal anterior communicating artery. Extensive atheromatous disease seen involving the anterior cerebral arteries bilaterally, right worse than left, with severe multifocal right A2 and A3 stenoses. The right ACA is markedly attenuated and irregular but remains patent. More mild disease seen on the left. M1 segments widely patent. Normal MCA bifurcations. Distal MCA branches well perfused and symmetric. POSTERIOR CIRCULATION: Atheromatous irregularity with moderate diffuse stenosis at the proximal left V4 segment somewhat linear filling defect seen within the left V4 segment on vascular MIP reconstructions, felt to be due to underlying irregular atherosclerotic disease. No visible dissection seen within this region on prior CTA. Focal severe distal right V4 stenosis noted just prior to the vertebrobasilar junction. Mild atheromatous irregularity about the proximal  basilar artery with no more than mild stenosis. Basilar otherwise patent to its distal aspect. Superior cerebral arteries patent bilaterally. Both PCAs primarily supplied via the basilar. Left PCA patent with mild multifocal left P2 stenoses. Previously seen proximal right PCA occlusion has been corrected, with the right PCA now patent to its distal aspect. However, severe atheromatous disease seen throughout the right P2 and distal PCA branches with associated arterial attenuation. No aneurysm. IMPRESSION: MRI HEAD IMPRESSION: 1. 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 Associated petechial hemorrhage without hemorrhagic transformation or significant mass effect. 2. Underlying age-related  cerebral atrophy with moderate chronic small vessel ischemic disease. MRA HEAD IMPRESSION: 1. Interval correction/resolution of previously seen proximal right PCA occlusion. Severe atheromatous disease seen throughout the underlying right P2 and distal right PCA branches. 2. Extensive atheromatous disease involving the anterior cerebral arteries bilaterally, right worse than left, with severe multifocal right A2 and A3 stenoses. 3. Moderate approximate 50% stenosis involving the cavernous left ICA. 4. Moderate to severe bilateral V4 stenoses as above. Electronically Signed   By: Jeannine Boga M.D.   On: 04/20/2020 01:56   MR BRAIN WO CONTRAST  Result Date: 04/20/2020 CLINICAL DATA:  Follow-up examination for acute stroke. EXAM: MRI HEAD WITHOUT CONTRAST MRA HEAD WITHOUT CONTRAST TECHNIQUE: Multiplanar, multiecho pulse sequences of the brain and surrounding structures were obtained without intravenous contrast. Angiographic images of the head were obtained using MRA technique without contrast. COMPARISON:  Prior CTs from 04/20/2020. FINDINGS: MRI HEAD FINDINGS Brain: Generalized age-related cerebral atrophy. Patchy T2/FLAIR hyperintensity within the periventricular and deep white matter both cerebral hemispheres most consistent with chronic small vessel ischemic disease, moderate in nature. Patchy restricted diffusion involving the parasagittal right frontal and parietal lobes, consistent with an acute ischemic right ACA territory infarct. Additional patchy diffusion abnormality seen involving the parasagittal right occipital lobe and right thalamus, consistent with right PCA territory infarct as well. No associated mass effect. Scattered areas of associated petechial hemorrhage without hemorrhagic transformation. No other evidence for acute or subacute ischemia. Gray-white matter differentiation otherwise maintained. No other areas of chronic cortical infarction. No other evidence for acute or chronic  intracranial hemorrhage. No mass lesion, midline shift or mass effect. No hydrocephalus or extra-axial fluid collection. Partially empty sella noted. Midline structures intact. Vascular: Major intracranial vascular flow voids are maintained. Skull and upper cervical spine: Craniocervical junction within normal limits. Bone marrow signal intensity normal. No scalp soft tissue abnormality. Sinuses/Orbits: Patient status post bilateral ocular lens replacement. Globes and orbital soft tissues demonstrate no acute finding. Scattered mucosal thickening noted throughout the paranasal sinuses. Fluid seen within the nasopharynx. Patient is intubated. Other: None. MRA HEAD FINDINGS ANTERIOR CIRCULATION: Visualized distal cervical segments of the internal carotid arteries are patent with antegrade flow. Petrous segments widely patent. Scattered atheromatous irregularity throughout the carotid siphons bilaterally, with associated moderate approximate 50% stenosis at the cavernous left ICA (series 10, image 97). No more than mild stenosis on the right. A1 segments patent bilaterally. Normal anterior communicating artery. Extensive atheromatous disease seen involving the anterior cerebral arteries bilaterally, right worse than left, with severe multifocal right A2 and A3 stenoses. The right ACA is markedly attenuated and irregular but remains patent. More mild disease seen on the left. M1 segments widely patent. Normal MCA bifurcations. Distal MCA branches well perfused and symmetric. POSTERIOR CIRCULATION: Atheromatous irregularity with moderate diffuse stenosis at the proximal left V4 segment somewhat linear filling defect seen within the left V4 segment on vascular MIP  reconstructions, felt to be due to underlying irregular atherosclerotic disease. No visible dissection seen within this region on prior CTA. Focal severe distal right V4 stenosis noted just prior to the vertebrobasilar junction. Mild atheromatous irregularity  about the proximal basilar artery with no more than mild stenosis. Basilar otherwise patent to its distal aspect. Superior cerebral arteries patent bilaterally. Both PCAs primarily supplied via the basilar. Left PCA patent with mild multifocal left P2 stenoses. Previously seen proximal right PCA occlusion has been corrected, with the right PCA now patent to its distal aspect. However, severe atheromatous disease seen throughout the right P2 and distal PCA branches with associated arterial attenuation. No aneurysm. IMPRESSION: MRI HEAD IMPRESSION: 1. 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 Associated petechial hemorrhage without hemorrhagic transformation or significant mass effect. 2. Underlying age-related cerebral atrophy with moderate chronic small vessel ischemic disease. MRA HEAD IMPRESSION: 1. Interval correction/resolution of previously seen proximal right PCA occlusion. Severe atheromatous disease seen throughout the underlying right P2 and distal right PCA branches. 2. Extensive atheromatous disease involving the anterior cerebral arteries bilaterally, right worse than left, with severe multifocal right A2 and A3 stenoses. 3. Moderate approximate 50% stenosis involving the cavernous left ICA. 4. Moderate to severe bilateral V4 stenoses as above. Electronically Signed   By: Jeannine Boga M.D.   On: 04/20/2020 01:56   US RENAL  Result Date: 04/22/2020 CLINICAL DATA:  Acute kidney injury. EXAM: RENAL / URINARY TRACT ULTRASOUND COMPLETE COMPARISON:  None. FINDINGS: Right Kidney: Renal measurements: 12.2 x 5.2 x 5.1 cm = volume: 160 mL. There is no hydronephrosis. There is increased cortical echogenicity. Left Kidney: Renal measurements: 12.1 x 6.2 x 6.6 cm = volume: 257 mL. There is increased cortical echogenicity without evidence for hydronephrosis. Bladder: The bladder is decompressed with a Foley catheter and therefore is poorly evaluated. Other: None. IMPRESSION: 1. No  hydronephrosis. 2. Echogenic kidneys bilaterally which can be seen in patients with medical renal disease. Electronically Signed   By: Constance Holster M.D.   On: 04/22/2020 19:19   DG CHEST PORT 1 VIEW  Result Date: 02-May-2020 CLINICAL DATA:  Central line placement. EXAM: PORTABLE CHEST 1 VIEW COMPARISON:  Chest x-ray dated April 19, 2020. FINDINGS: New right internal jugular dialysis catheter with tip near the cavoatrial junction. New left internal jugular central venous catheter with tip in the proximal right atrium. New feeding tube entering the stomach with the tip below the field of view. Unchanged enteric tube. Stable cardiomediastinal silhouette status post CABG. Bilateral basilar predominant hazy interstitial and airspace opacities have mildly worsened on the right. New small right pleural effusion. Suspected trace left pleural effusion is unchanged. No pneumothorax. No acute osseous abnormality. IMPRESSION: 1. New right and left internal jugular central venous catheters without complicating feature. 2. Bilateral airspace disease, worsened on the right. New small right pleural effusion. Electronically Signed   By: Titus Dubin M.D.   On: 05-02-20 14:59   DG Chest Port 1 View  Result Date: 04/17/2020 CLINICAL DATA:  Shortness of breath, COVID positive. EXAM: PORTABLE CHEST 1 VIEW COMPARISON:  04/02/2020 and CT chest 09/26/2019. FINDINGS: Trachea is midline. Heart is enlarged, as before. There is mixed interstitial and airspace opacification, bibasilar predominant, similar to 03/20/2020. Difficult to exclude bilateral pleural effusions. IMPRESSION: Mixed interstitial and airspace opacification, mildly basilar predominant. Possible small bilateral pleural effusions. Findings can be seen with congestive heart failure and/or COVID-19 pneumonia. Electronically Signed   By: Lorin Picket M.D.  On: 04/17/2020 08:23   DG Chest Port 1 View  Result Date: 03/18/2020 CLINICAL DATA:  Weakness  and shortness of breath. EXAM: PORTABLE CHEST 1 VIEW COMPARISON:  January 20, 2018 FINDINGS: Multiple sternal wires and vascular clips are seen. Mild, chronic diffusely increased interstitial lung markings are noted. Mild to moderate severity areas of atelectasis and/or infiltrate are seen within the bilateral lung bases. Small bilateral pleural effusions are seen. No pneumothorax is identified. The cardiac silhouette is moderately enlarged. The visualized skeletal structures are unremarkable. IMPRESSION: 1. Mild to moderate severity bibasilar atelectasis and/or infiltrate. 2. Small bilateral pleural effusions. 3. Chronic interstitial lung disease. 4. Moderate cardiomegaly. Electronically Signed   By: Virgina Norfolk M.D.   On: 03/24/2020 21:22   DG Chest Port 1V same Day  Result Date: 05/14/2020 CLINICAL DATA:  Endotracheal tube present.  OG tube. EXAM: PORTABLE CHEST 1 VIEW COMPARISON:  Chest radiograph yesterday. FINDINGS: Endotracheal tube tip is at the level of the clavicular heads. Enteric tube is in place with tip below the diaphragm in the stomach, side-port tentatively visualized in the lower esophagus. Low lung volumes with patchy and heterogeneous bilateral basilar predominant airspace disease. Fine interstitial opacities in the upper lung zones. Post median sternotomy with cardiomegaly. No pneumothorax. No large pleural effusion. IMPRESSION: 1. Endotracheal tube tip at the level of the clavicular heads. Enteric tube with tip below the diaphragm in the stomach, side-port tentatively visualized in the lower esophagus. Advancement of 5 cm would place the side-port below the diaphragm. 2. Low lung volumes with patchy bibasilar predominant airspace disease, pneumonia versus edema. Electronically Signed   By: Keith Rake M.D.   On: 05/06/2020 22:48   DG Chest Port 1V same Day  Result Date: 04/18/2020 CLINICAL DATA:  Shortness of breath, COVID EXAM: PORTABLE CHEST 1 VIEW COMPARISON:  04/17/2020  FINDINGS: Low lung volumes. Persistent diffuse bilateral opacities. Unchanged possible small bilateral pleural effusions. No pneumothorax. Cardiomegaly. IMPRESSION: No substantial change in bilateral pulmonary opacities and possible small bilateral pleural effusions with cardiomegaly. May reflect COVID-19 pneumonia and/or edema. Electronically Signed   By: Macy Mis M.D.   On: 04/18/2020 09:32   DG Abd Portable 1V  Result Date: 04/22/2020 CLINICAL DATA:  Check gastric catheter placement EXAM: PORTABLE ABDOMEN - 1 VIEW COMPARISON:  04/20/2020 FINDINGS: Gastric catheter is been removed in the interval. Feeding catheter is now noted extending into the distal stomach. IMPRESSION: Feeding catheter in the distal stomach. Electronically Signed   By: Inez Catalina M.D.   On: 04/22/2020 14:20   DG Abd Portable 1V  Result Date: 04/20/2020 CLINICAL DATA:  OG tube placement EXAM: PORTABLE ABDOMEN - 1 VIEW COMPARISON:  04/17/2020 FINDINGS: NG tube is in place within the stomach. Prior cholecystectomy. Nonobstructive bowel gas pattern. IMPRESSION: NG tube in the stomach. Electronically Signed   By: Rolm Baptise M.D.   On: 04/20/2020 02:22   DG Abd Portable 1V  Result Date: 05/07/2020 CLINICAL DATA:  NG tube placement. EXAM: PORTABLE ABDOMEN - 1 VIEW COMPARISON:  Abdominal CT 04/01/2020 FINDINGS: Tip of the enteric tube is below the diaphragm in the stomach, side-port in the region of the distal esophagus. Recommend advancement of least 7 cm for optimal placement. Normal bowel gas pattern with moderate volume of colonic stool. There is excreted contrast in the urinary bladder. Surgical clips in the right upper quadrant. IMPRESSION: Tip of the enteric tube below the diaphragm in the stomach, side-port in the region of the distal esophagus. Recommend advancement of least  7 cm for optimal placement. Electronically Signed   By: Keith Rake M.D.   On: 05/13/2020 22:50   ECHOCARDIOGRAM COMPLETE  Result Date:  04/20/2020    ECHOCARDIOGRAM REPORT   Patient Name:   Jaquon Gingerich. Date of Exam: 04/20/2020 Medical Rec #:  [id number]         Height:       72.0 in Accession #:    [id number]        Weight:       196.2 lb Date of Birth:  [birth year]          BSA:          2.113 m Patient Age:    56 years          BP:           131/62 mmHg Patient Gender: M                 HR:           80 bpm. Exam Location:  Inpatient Procedure: 2D Echo Indications:    stroke 434.91  History:        Patient has prior history of Echocardiogram examinations, most                 recent 01/23/2020. CHF, CAD and Previous Myocardial Infarction,                 Prior CABG, Arrythmias:Atrial Fibrillation and Atrial Flutter;                 Risk Factors:Hypertension, Dyslipidemia and Former Smoker. Covid                 +.  Sonographer:    Jannett Celestine RDCS (AE) Referring Phys: [id number] Lorenza Chick  Sonographer Comments: Echo performed with patient supine and on artificial respirator. Image acquisition challenging due to respiratory motion. suboptimal windows due to restricted mobility IMPRESSIONS  1. Left ventricular ejection fraction, by estimation, is 65 to 70%. The left ventricle has normal function. The left ventricle has no regional wall motion abnormalities. Left ventricular diastolic parameters are consistent with Grade I diastolic dysfunction (impaired relaxation).  2. Right ventricular systolic function is moderately reduced. The right ventricular size is moderately enlarged.  3. Right atrial size was moderately dilated.  4. The mitral valve is normal in structure. No evidence of mitral valve regurgitation. No evidence of mitral stenosis.  5. The aortic valve is normal in structure. Aortic valve regurgitation is not visualized. No aortic stenosis is present.  6. The inferior vena cava is normal in size with greater than 50% respiratory variability, suggesting right atrial pressure of 3 mmHg. Conclusion(s)/Recommendation(s): No intracardiac  source of embolism detected on this transthoracic study. A transesophageal echocardiogram is recommended to exclude cardiac source of embolism if clinically indicated. FINDINGS  Left Ventricle: Left ventricular ejection fraction, by estimation, is 65 to 70%. The left ventricle has normal function. The left ventricle has no regional wall motion abnormalities. The left ventricular internal cavity size was normal in size. There is  no left ventricular hypertrophy. Left ventricular diastolic parameters are consistent with Grade I diastolic dysfunction (impaired relaxation). Right Ventricle: The right ventricular size is moderately enlarged. No increase in right ventricular wall thickness. Right ventricular systolic function is moderately reduced. Left Atrium: Left atrial size was normal in size. Right Atrium: Right atrial size was moderately dilated. Pericardium: There is no evidence of pericardial effusion. Mitral Valve: The mitral  valve is normal in structure. No evidence of mitral valve regurgitation. No evidence of mitral valve stenosis. Tricuspid Valve: The tricuspid valve is normal in structure. Tricuspid valve regurgitation is not demonstrated. No evidence of tricuspid stenosis. Aortic Valve: The aortic valve is normal in structure. Aortic valve regurgitation is not visualized. No aortic stenosis is present. Pulmonic Valve: The pulmonic valve was normal in structure. Pulmonic valve regurgitation is not visualized. No evidence of pulmonic stenosis. Aorta: The aortic root is normal in size and structure. Venous: The inferior vena cava is normal in size with greater than 50% respiratory variability, suggesting right atrial pressure of 3 mmHg. IAS/Shunts: No atrial level shunt detected by color flow Doppler.  AORTIC VALVE LVOT Vmax:   92.50 cm/s LVOT Vmean:  63.200 cm/s LVOT VTI:    0.152 m MITRAL VALVE MV Area (PHT): 2.37 cm     SHUNTS MV Decel Time: 320 msec     Systemic VTI: 0.15 m MV E velocity: 84.80 cm/s MV A  velocity: 141.00 cm/s MV E/A ratio:  0.60 Ena Dawley MD Electronically signed by Ena Dawley MD Signature Date/Time: 04/20/2020/4:10:20 PM    Final    CT HEAD CODE STROKE WO CONTRAST  Result Date: 05/07/2020 CLINICAL DATA:  Code stroke. 69 year old male with acute left side weakness. On Eliquis. EXAM: CT HEAD WITHOUT CONTRAST TECHNIQUE: Contiguous axial images were obtained from the base of the skull through the vertex without intravenous contrast. COMPARISON:  Head CT 03/02/2020. FINDINGS: Brain: There is a small area of lost gray-white matter differentiation at the right superior motor and pre motor area in the midline corresponding to distal right ACA territory (series 3, image 28). Associated cortical hypodensity. No hemorrhage or mass effect. Patchy bilateral white matter hypodensity elsewhere is stable. No other changes of acute cortically based infarct identified. Possible confluent hypodensity in the lower pons is stable (versus recurrent CT artifact on series 3, image 10). Stable ventricle size and configuration. No midline shift, ventriculomegaly, mass effect, evidence of mass lesion, intracranial hemorrhage. Vascular: Calcified atherosclerosis at the skull base. No suspicious intracranial vascular hyperdensity. Skull: Stable.  No acute osseous abnormality identified. Sinuses/Orbits: Improved paranasal sinus aeration since September. Continued right maxillary mucosal thickening and small left maxillary fluid level. Continued bubbly opacity in the left frontal sinus. Tympanic cavities and mastoids remain clear. Other: No acute orbit or scalp soft tissue finding. ASPECTS [facility] Stroke Program Early CT Score) Total score (0-10 with 10 being normal): 9 (abnormal right M5 segment) IMPRESSION: 1. 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 No associated hemorrhage or mass effect. ASPECTS 9. 2. Otherwise stable compared to September. 3. These results were communicated to Dr.  Curly [place] at [place] pm on 04/16/2020 by text page via the [facility] messaging system. Electronically Signed   By: Genevie Ann M.D.   On: 05/08/2020 17:03   VAS [place] LOWER EXTREMITY VENOUS (DVT)  Result Date: 04/20/2020  Lower Venous DVT Study Indications: Covid-19, elevated D-Dimer.  Limitations: Ventilation, shadowing from arterial plaque. Comparison Study: prior positive left lower extremity venous duplex done                   12/07/2017, is available for comparison Performing Technologist: Sharion Dove RVS  Examination Guidelines: A complete evaluation includes B-mode imaging, spectral Doppler, color Doppler, and power Doppler as needed of all accessible portions of each vessel. Bilateral testing is considered an integral part of a complete examination. Limited examinations for reoccurring indications may be performed as  noted. The reflux portion of the exam is performed with the patient in reverse Trendelenburg.  +---------+---------------+---------+-----------+----------+--------------+ RIGHT    CompressibilityPhasicitySpontaneityPropertiesThrombus Aging +---------+---------------+---------+-----------+----------+--------------+ CFV      Full           Yes      Yes                                 +---------+---------------+---------+-----------+----------+--------------+ SFJ      Full                                                        +---------+---------------+---------+-----------+----------+--------------+ FV Prox  Full                                                        +---------+---------------+---------+-----------+----------+--------------+ FV Mid   Full                                                        +---------+---------------+---------+-----------+----------+--------------+ FV DistalFull                                                        +---------+---------------+---------+-----------+----------+--------------+ PFV      Full                                                         +---------+---------------+---------+-----------+----------+--------------+ POP      Full           Yes      Yes                                 +---------+---------------+---------+-----------+----------+--------------+ PTV      Full                                                        +---------+---------------+---------+-----------+----------+--------------+ PERO     Full                                                        +---------+---------------+---------+-----------+----------+--------------+   +---------+---------------+---------+-----------+----------+--------------+ LEFT     CompressibilityPhasicitySpontaneityPropertiesThrombus Aging +---------+---------------+---------+-----------+----------+--------------+ CFV      Full           Yes  Yes                                 +---------+---------------+---------+-----------+----------+--------------+ SFJ      Full                                                        +---------+---------------+---------+-----------+----------+--------------+ FV Prox  Full                                                        +---------+---------------+---------+-----------+----------+--------------+ FV Mid   Full                                                        +---------+---------------+---------+-----------+----------+--------------+ FV DistalFull                                                        +---------+---------------+---------+-----------+----------+--------------+ PFV      Full                                                        +---------+---------------+---------+-----------+----------+--------------+ POP      Full           Yes      Yes                                 +---------+---------------+---------+-----------+----------+--------------+ PTV      Full                                                         +---------+---------------+---------+-----------+----------+--------------+ PERO     Full                                                        +---------+---------------+---------+-----------+----------+--------------+     Summary: RIGHT: - There is no evidence of deep vein thrombosis in the lower extremity.  LEFT: - Findings appear improved from previous examination. - There is no evidence of deep vein thrombosis in the lower extremity.  *See table(s) above for measurements and observations. Electronically signed by Servando Snare MD on 04/20/2020 at 3:29:54 PM.    Final     Microbiology Recent Results (from the past 240 hour(s))  Respiratory  Panel by RT PCR (Flu A&B, Covid) - Nasopharyngeal Swab     Status: Abnormal   Collection Time: 03/20/2020  9:00 PM   Specimen: Nasopharyngeal Swab  Result Value Ref Range Status   SARS Coronavirus 2 by RT PCR POSITIVE (A) NEGATIVE Final    Comment: RESULT CALLED TO, READ BACK BY AND VERIFIED WITH: RN C BLACK @[id number]  04/11/2020 BY S GEZAHEGN (NOTE) SARS-CoV-2 target nucleic acids are DETECTED.  SARS-CoV-2 RNA is generally detectable in upper respiratory specimens  during the acute phase of infection. Positive results are indicative of the presence of the identified virus, but do not rule out bacterial infection or co-infection with other pathogens not detected by the test. Clinical correlation with patient history and other diagnostic information is necessary to determine patient infection status. The expected result is Negative.  Fact Sheet for Patients:  PinkCheek.be  Fact Sheet for Healthcare Providers: GravelBags.it  This test is not yet approved or cleared by the [place] FDA and  has been authorized for detection and/or diagnosis of SARS-CoV-2 by FDA under an Emergency Use Authorization (EUA).  This EUA will remain in effect (meaning this test ca n be used) for the duration of   the COVID-19 declaration under Section 564(b)(1) of the Act, 21 U.S.C. section 360bbb-3(b)(1), unless the authorization is terminated or revoked sooner.      Influenza A by PCR NEGATIVE NEGATIVE Final   Influenza B by PCR NEGATIVE NEGATIVE Final    Comment: (NOTE) The Xpert Xpress SARS-CoV-2/FLU/RSV assay is intended as an aid in  the diagnosis of influenza from Nasopharyngeal swab specimens and  should not be used as a sole basis for treatment. Nasal washings and  aspirates are unacceptable for Xpert Xpress SARS-CoV-2/FLU/RSV  testing.  Fact Sheet for Patients: PinkCheek.be  Fact Sheet for Healthcare Providers: GravelBags.it  This test is not yet approved or cleared by the [place] FDA and  has been authorized for detection and/or diagnosis of SARS-CoV-2 by  FDA under an Emergency Use Authorization (EUA). This EUA will remain  in effect (meaning this test can be used) for the duration of the  Covid-19 declaration under Section 564(b)(1) of the Act, 21  U.S.C. section 360bbb-3(b)(1), unless the authorization is  terminated or revoked. Performed at [facility], [place] [address] [place], [place] [address]   Blood Culture (routine x 2)     Status: None   Collection Time: 04/02/2020  9:20 PM   Specimen: BLOOD  Result Value Ref Range Status   Specimen Description BLOOD LEFT ANTECUBITAL  Final   Special Requests   Final    BOTTLES DRAWN AEROBIC AND ANAEROBIC Blood Culture results may not be optimal due to an inadequate volume of blood received in culture bottles   Culture   Final    NO GROWTH 5 DAYS Performed at [facility], [place] [address], [place], [place] [address]    Report Status 04/18/2020 FINAL  Final  Blood Culture (routine x 2)     Status: None   Collection Time: 04/02/2020  9:31 PM   Specimen: BLOOD RIGHT FOREARM  Result Value Ref Range Status   Specimen Description BLOOD RIGHT FOREARM  Final    Special Requests   Final    BOTTLES DRAWN AEROBIC AND ANAEROBIC Blood Culture results may not be optimal due to an inadequate volume of blood received in culture bottles   Culture   Final    NO GROWTH 5 DAYS Performed at [facility], [place] [address], [place], [place] [address]    Report Status 05/05/2020 FINAL  Final  Urine culture     Status: Abnormal   Collection Time: 03/18/2020 10:20 PM   Specimen: Urine, Random  Result Value Ref Range Status   Specimen Description URINE, RANDOM  Final   Special Requests NONE  Final   Culture (A)  Final    <10,000 COLONIES/mL INSIGNIFICANT GROWTH Performed at [facility], [place] [address], [place], [place] [address]    Report Status 04/16/2020 FINAL  Final  MRSA PCR Screening     Status: None   Collection Time: 04/23/2020  9:50 PM   Specimen: Nasopharyngeal  Result Value Ref Range Status   MRSA by PCR NEGATIVE NEGATIVE Final    Comment:        The GeneXpert MRSA Assay (FDA approved for NASAL specimens only), is one component of a comprehensive MRSA colonization surveillance program. It is not intended to diagnose MRSA infection nor to guide or monitor treatment for MRSA infections. Performed at [facility], [place] [address] [place], [place] [address]     Lab Basic Metabolic Panel: Recent Labs  Lab 05/05/2020 0024 04/27/2020 2151 04/20/20 1332 04/20/20 1332 04/21/20 0555 04/21/20 1205 04/22/20 0544 04/22/20 0939 04/22/20 1811 04/23/20 0547 04/23/20 1751 04-30-2020 0407 04-30-2020 1505  NA 141   < > 139   < > 142   < > 143 144  --  146*  --  141 147*  K 3.8   < > 4.9   < > 4.3   < > 4.7 4.7  --  5.0  --  5.5* 6.0*  CL 105   < > 108  --  110  --  110  --   --  114*  --  110  --   CO2 23   < > 19*  --  19*  --  20*  --   --  19*  --  16*  --   GLUCOSE 149*   < > 261*  --  206*  --  188*  --   --  295*  --  292*  --   BUN 78*   < > 87*  --  110*  --  144*  --   --  173*  --  198*  --   CREATININE 2.61*   < > 3.36*  --   4.62*  --  5.73*  --   --  5.86*  --  5.95*  --   CALCIUM 8.9   < > 7.7*  --  8.2*  --  8.2*  --   --  8.2*  --  7.6*  --   MG 2.0  --   --   --   --   --   --   --  2.8* 3.0* 2.9* 3.0*  --   PHOS  --   --   --   --   --   --   --   --  5.7* 5.8* 5.1* 4.6  --    < > = values in this interval not displayed.   Liver Function Tests: Recent Labs  Lab 04/20/20 0434 04/21/20 0555 04/22/20 0544 04/23/20 0547 2020/04/30 0407  AST 21 20 22 29  48*  ALT 14 11 8 10 22   ALKPHOS 66 77 79 72 84  BILITOT 0.7 0.6 0.4 0.5 0.9  PROT 4.6* 5.0* 5.0* 4.8* 4.4*  ALBUMIN 2.0* 2.4*  2.3* 2.2* 2.0*   No results for input(s): LIPASE, AMYLASE in the last 168 hours. No results for input(s): AMMONIA in the last 168 hours. CBC: Recent Labs  Lab 04/18/20 0233 04/18/20 0233 04/28/2020 0024 04/29/2020 2151 04/20/20 0434 04/20/20 0434 04/21/20 0555 04/21/20 1205 04/22/20 0544 04/22/20 0939 04/23/20 0547 May 09, 2020 0407 2020/05/09 1505  WBC 14.9*   < > 14.2*  --  18.6*  --  19.7*  --  18.9*  --  20.8* 25.6*  --   NEUTROABS 13.7*  --  12.9*  --   --   --   --   --   --   --   --   --   --   HGB 12.6*   < > 13.7   < > 10.0*   < > 11.8*   < > 11.3* 11.9* 10.8* 9.7* 10.9*  HCT 38.3*   < > 42.8   < > 32.9*   < > 36.3*   < > 35.8* 35.0* 33.9* 30.8* 32.0*  MCV 80.5   < > 80.8  --  85.7  --  80.8  --  81.2  --  82.7 82.1  --   PLT 380   < > 397  --  363  --  361  --  334  --  302 281  --    < > = values in this interval not displayed.   Cardiac Enzymes: No results for input(s): CKTOTAL, CKMB, CKMBINDEX, TROPONINI in the last 168 hours. Sepsis Labs: Recent Labs  Lab 04/21/20 0555 04/22/20 0544 04/23/20 0547 05/09/20 0407  WBC 19.7* 18.9* 20.8* 25.6*    Procedures/Operations     Junious Ragone 05-09-2020, 5:59 PM

## 2020-05-15 NOTE — Progress Notes (Signed)
ANTICOAGULATION CONSULT NOTE  Pharmacy Consult for Heparin Indication: atrial fibrillation  No Known Allergies  Patient Measurements: Height: 6' (182.9 cm) Weight: 90.1 kg (198 lb 10.2 oz) IBW/kg (Calculated) : 77.6 Heparin Dosing Weight: 86 kg   Vital Signs: BP: 121/54 (11/10 0400) Pulse Rate: 75 (11/10 0400)  Labs: Recent Labs    04/22/20 0544 04/22/20 0544 04/22/20 0939 04/22/20 0939 04/22/20 1811 04/22/20 2016 04/22/20 2016 04/23/20 0547 04/23/20 0745 04/23/20 1751 05/21/20 0407  HGB 11.3*   < > 11.9*   < >  --   --   --  10.8*  --   --  9.7*  HCT 35.8*   < > 35.0*  --   --   --   --  33.9*  --   --  30.8*  PLT 334  --   --   --   --   --   --  302  --   --  281  APTT  --   --   --   --   --  122*   < >  --  91* 57* 64*  HEPARINUNFRC  --   --   --   --    < > >2.20*  --  >2.20*  --   --  2.18*  CREATININE 5.73*  --   --   --   --   --   --  5.86*  --   --  5.95*   < > = values in this interval not displayed.    Estimated Creatinine Clearance: 12.5 mL/min (A) (by C-G formula based on SCr of 5.95 mg/dL (H)).   Medical History: Past Medical History:  Diagnosis Date  . A-fib ([place])    with RVR  . Acute kidney injury ([place])   . Anemia   . Angina   . Arthritis    "hands" (08/10/2017)  . Atrial flutter, paroxysmal ([place])    arrived with aflutter with RVR, EF down to 25-30%, s/p TEE DCCV on 01/29/2015  . Cancer ([place])   . CHF (congestive heart failure) ([place])   . Chronic lower back pain    "all the time from my sciatic nerve"  . Coronary artery disease   . DVT (deep venous thrombosis) ([place])   . Dyspnea   . Erectile dysfunction   . Heart failure ([place])   . Hypercholesteremia   . Hypertension   . 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 Myocardial infarction ([place]) 1988; ~1610;  ~1996;~ 2000  . Obesity   . Peripheral vascular disease ([place])   . Umbilical hernia    unrepaired    Medications:  Medications Prior to Admission  Medication Sig  Dispense Refill Last Dose  . amiodarone (PACERONE) 200 MG tablet TAKE 1 TABLET BY MOUTH EVERY DAY (Patient taking differently: Take 200 mg by mouth daily. ) 90 tablet 3 Past Week at Unknown time  . gabapentin (NEURONTIN) 300 MG capsule Take 300 mg by mouth 2 (two) times daily.    Past Week at Unknown time  . insulin degludec (TRESIBA FLEXTOUCH) 100 UNIT/ML FlexTouch Pen Inject 30 Units into the skin daily.    Past Week at Unknown time  . losartan (COZAAR) 50 MG tablet Take 1 tablet (50 mg total) by mouth daily. 90 tablet 3 Past Week at Unknown time  . metFORMIN (GLUCOPHAGE) 1000 MG tablet Take 1,000 mg by mouth 2 (two) times daily with a meal.   Past Week at Unknown  time  . nitroGLYCERIN (NITROSTAT) 0.4 MG SL tablet PLACE 1 TABLET UNDER THE TONGUE EVERY 5 (FIVE) MINUTES AS NEEDED FOR CHEST PAIN (ANGINA). (Patient taking differently: Place 0.4 mg under the tongue every 5 (five) minutes as needed for chest pain. ) 25 tablet 6 unknown  . pantoprazole (PROTONIX) 40 MG tablet TAKE 1 TABLET BY MOUTH EVERY DAY (Patient taking differently: Take 40 mg by mouth daily. ) 90 tablet 1 Past Week at Unknown time  . rosuvastatin (CRESTOR) 40 MG tablet Take 1 tablet (40 mg total) by mouth daily. 90 tablet 3 Past Week at Unknown time    Assessment: 79 YOM who presented with acute R PCA occlusion s/p endovascular treatment. Patient was on Eliquis PTA for h/o Afib which is currently on hold. Pharmacy consulted to start IV heparin without bolus for secondary stroke prophylaxis in the setting of AFib.  APTT this evening is now subtherapeutic at 57 after rate decrease earlier today (aPTT high on heparin at 900 units/hr). Heparin level remains > 2.2, likely from AKI in the setting of outpatient Eliquis use. H/H down slightly today. Plt wnl. No bleeding or issues with infusion per discussion with RN.  11/10 AM update:  APTT just below goal No issues per RN  Goal of Therapy:  Heparin level 0.3-0.5 units/ml aPTT 66-85  seconds Monitor platelets by anticoagulation protocol: Yes   Plan:  Increase IV heparin slightly to 950 units/hr  F/u 8 hr aPTT at 1400 Monitor daily heparin level/aPTT, CBC, and s/sx of bleeding   Narda Bonds, PharmD, BCPS Clinical Pharmacist Phone: [phone]

## 2020-05-15 NOTE — Procedures (Signed)
Intubation Procedure Note  Justin Blackburn  [id number]  09-03-1950  Date:05/08/2020  Time:4:27 PM   Provider Performing:Rosmarie Esquibel    Procedure: Intubation (31500)  Indication(s) Respiratory Failure  Consent Risks of the procedure as well as the alternatives and risks of each were explained to the patient and/or caregiver.  Consent for the procedure was obtained and is signed in the bedside chart   Anesthesia Etomidate and Rocuronium   Time Out Verified patient identification, verified procedure, site/side was marked, verified correct patient position, special equipment/implants available, medications/allergies/relevant history reviewed, required imaging and test results available.   Sterile Technique Usual hand hygeine, masks, and gloves were used   Procedure Description Patient positioned in bed supine.  Sedation given as noted above.  Patient was intubated with endotracheal tube using Glidescope.  View was Grade 1 full glottis .  Number of attempts was 1.  Colorimetric CO2 detector was consistent with tracheal placement.   Complications/Tolerance None; patient tolerated the procedure well. Chest X-ray is ordered to verify placement.   EBL Minimal   Specimen(s) None

## 2020-05-15 NOTE — Consult Note (Signed)
Consultation Note Date: 05-May-2020   Patient Name: Justin Blackburn.  DOB: December 08, 1950  MRN: [id number]  Age / Sex: 69 y.o., male  PCP: [name], PA-C Referring Physician: Jacky Kindle, MD  Reason for Consultation: Establishing goals of care  HPI/Patient Profile: 69 y.o. male  with past medical history of A. Fib, diabetes, CKD, CAD, CABG x2, esophageal cancer status post treatment with chemotherapy and radiation, currently on surveillance with no evidence of recurrence admitted on 04/01/2020 with shortness of breath, weakness and cough.  Work-up revealed Covid positivity and pneumonia.  His admission has been complicated by a large CVA, and worsening renal function.  Underwent thrombectomy on 11/5, post procedure he did not wake up well and remained intubated.  EEG showed moderate to severe diffuse encephalopathy no seizures.  He was extubated on November 8, at which time he was not opening his eyes and can follow simple commands on his left side only.  His kidney function continued to worsen and family was currently in discussion regarding starting dialysis.  He continued on with high flow oxygen requirements.  Today, November 10, he had worsening respiratory status and hypotension requiring intubation and vasopressors. Palliative consulted for goals of care.  Clinical Assessment and Goals of Care:  I have reviewed medical records including EPIC notes, labs and imaging, received report from Dr. Tacy Learn, examined the patient and called his wife to discuss diagnosis prognosis, GOC, EOL wishes, disposition and options.  We discussed a brief life review of the patient.  He is married with sons and several grandchildren, he has had many complicated health scares over the past several years that he has been able to overcome.  As far as functional and nutritional status prior to admission he was independent with all  ADLs and no issues with his nutrition.  We discussed his current illness and what it means in the larger context of his on-going co-morbidities.  Natural disease trajectory and expectations at EOL were discussed.  I relayed concerns that patient is at high risk of dying, and if he is able to survive his current state there is worry he could be permanently debilitated, needing to live long-term in an acute care hospital or nursing facility.  Family expressed concerns regarding visitation policy, gave them emotional support and understanding of their frustration.  I deferred the majority of their questions regarding visitation to the nursing staff.  We did discuss that if patient was dying or we may plan for transition to full comfort measures, then visitors would be allowed within the guidelines of the visitation policy.  I attempted to elicit values and goals of care important to the patient.  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  He had a spotted [place] that he rode several times in the [employer].  The difference between aggressive medical intervention and comfort care was discussed in light of the patient's goals of care.   Advanced directives, concepts specific to  code status, artifical feeding and hydration, and rehospitalization were considered and discussed.  Family agrees to limiting resuscitation, if patient experiences cardiac arrest by all current interventions- then no CPR.  Questions and concerns were addressed.  The family was encouraged to call with questions or concerns.   Primary Decision Maker NEXT OF KIN- spouse    SUMMARY OF RECOMMENDATIONS -Partial code order entered-no CPR or escalation if patient experiences cardiac arrest-continue current interventions -Plan to meet with patient's wife and children tomorrow in person per their request to further discuss transition to comfort measures  and withdrawal of life support -Spouse requested to visit patient through glass tonight- I deferred this to nursing unit staff  Code Status/Advance Care Planning:  Limited code-currently intubated and on pressors, if cardiac arrest no CPR  Prognosis:    Unable to determine  Discharge Planning: To Be Determined  Primary Diagnoses: Present on Admission: . AF (paroxysmal atrial fibrillation) ([place]) . Chronic combined systolic and diastolic heart failure, NYHA class 2 ([place]) . CKD (chronic kidney disease), stage IV ([place]) . Diffuse interstitial pulmonary disease ([place]) . DVT (deep venous thrombosis) ([place]) . Hypertension . Peripheral vascular disease ([place]) . Acute respiratory failure due to COVID-19 ([place]) . Posterior cerebral artery embolism, right   I have reviewed the medical record, interviewed the patient and family, and examined the patient. The following aspects are pertinent.  Past Medical History:  Diagnosis Date  . A-fib ([place])    with RVR  . Acute kidney injury ([place])   . Anemia   . Angina   . Arthritis    "hands" (08/10/2017)  . Atrial flutter, paroxysmal ([place])    arrived with aflutter with RVR, EF down to 25-30%, s/p TEE DCCV on 01/29/2015  . Cancer ([place])   . CHF (congestive heart failure) ([place])   . Chronic lower back pain    "all the time from my sciatic nerve"  . Coronary artery disease   . DVT (deep venous thrombosis) ([place])   . Dyspnea   . Erectile dysfunction   . Heart failure ([place])   . Hypercholesteremia   . Hypertension   . 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 Myocardial infarction ([place]) 1988; ~4709;  ~1996;~ 2000  . Obesity   . Peripheral vascular disease ([place])   . Umbilical hernia    unrepaired   Social History   Socioeconomic History  . Marital status: Married    Spouse name: Not on file  . Number of children: Not on file  . Years of education: Not on file  . Highest education level: Not on file  Occupational History  .  Not on file  Tobacco Use  . Smoking status: Former Smoker    Packs/day: 2.00    Years: 6.00    Pack years: 12.00    Types: Cigarettes    Quit date: 06/16/1971    Years since quitting: 48.8  . Smokeless tobacco: Never Used  . Tobacco comment: "chewed on cigars; never really did chew tobacco"  Vaping Use  . Vaping Use: Never used  Substance and Sexual Activity  . Alcohol use: No  . Drug use: No  . Sexual activity: Never  Other Topics Concern  . Not on file  Social History Narrative  . Not on file   Social Determinants of Health   Financial Resource Strain:   . Difficulty of Paying Living Expenses: Not on file  Food Insecurity:   . Worried About Charity fundraiser in  the Last Year: Not on file  . Ran Out of Food in the Last Year: Not on file  Transportation Needs:   . Lack of Transportation (Medical): Not on file  . Lack of Transportation (Non-Medical): Not on file  Physical Activity:   . Days of Exercise per Week: Not on file  . Minutes of Exercise per Session: Not on file  Stress:   . Feeling of Stress : Not on file  Social Connections:   . Frequency of Communication with Friends and Family: Not on file  . Frequency of Social Gatherings with Friends and Family: Not on file  . Attends Religious Services: Not on file  . Active Member of Clubs or Organizations: Not on file  . Attends Archivist Meetings: Not on file  . Marital Status: Not on file   Scheduled Meds: . amiodarone  200 mg Per Tube Daily  . amLODipine  10 mg Per Tube Daily  . aspirin  81 mg Per Tube Daily  . chlorhexidine gluconate (MEDLINE KIT)  15 mL Mouth Rinse BID  . Chlorhexidine Gluconate Cloth  6 each Topical Daily  . feeding supplement (PROSource TF)  45 mL Per Tube Daily  . free water  300 mL Per Tube Q6H  . hydrocortisone sod succinate (SOLU-CORTEF) inj  50 mg Intravenous Q6H  . insulin aspart  0-15 Units Subcutaneous Q4H  . insulin aspart  10 Units Subcutaneous Q4H  . insulin  glargine  30 Units Subcutaneous BID  . Ipratropium-Albuterol  1 puff Inhalation BID  . mouth rinse  15 mL Mouth Rinse 10 times per day  . pantoprazole sodium  40 mg Per Tube Daily  . phenylephrine      . rosuvastatin  40 mg Per Tube Daily   Continuous Infusions: .  prismasol BGK 4/2.5    .  prismasol BGK 4/2.5    . sodium chloride    . sodium chloride 10 mL/hr at 2020/05/13 1400  . ampicillin-sulbactam (UNASYN) IV 3 g (May 13, 2020 1601)  . calcium gluconate    . calcium gluconate in NaCl    . feeding supplement (JEVITY 1.2 CAL) 1,000 mL (04/23/20 1328)  . heparin Stopped (05-13-20 1232)  . norepinephrine (LEVOPHED) Adult infusion 60 mcg/min (05-13-2020 1500)  . prismasol BGK 4/2.5    . vasopressin Stopped (05-13-20 1345)   PRN Meds:.acetaminophen **OR** acetaminophen (TYLENOL) oral liquid 160 mg/5 mL **OR** acetaminophen, heparin, heparin, Ipratropium-Albuterol, midazolam, nitroGLYCERIN, ondansetron **OR** ondansetron (ZOFRAN) IV Medications Prior to Admission:  Prior to Admission medications   Medication Sig Start Date End Date Taking? Authorizing Provider  amiodarone (PACERONE) 200 MG tablet TAKE 1 TABLET BY MOUTH EVERY DAY Patient taking differently: Take 200 mg by mouth daily.  03/21/20  Yes Belva Crome, MD  ELIQUIS 5 MG TABS tablet TAKE 1 TABLET BY MOUTH TWICE A DAY 04/25/2020   Belva Crome, MD  gabapentin (NEURONTIN) 300 MG capsule Take 300 mg by mouth 2 (two) times daily.    Yes [provider]  insulin degludec (TRESIBA FLEXTOUCH) 100 UNIT/ML FlexTouch Pen Inject 30 Units into the skin daily.    Yes [provider]  losartan (COZAAR) 50 MG tablet Take 1 tablet (50 mg total) by mouth daily. 03/14/20  Yes Belva Crome, MD  metFORMIN (GLUCOPHAGE) 1000 MG tablet Take 1,000 mg by mouth 2 (two) times daily with a meal. 05/19/18  Yes [provider]  nitroGLYCERIN (NITROSTAT) 0.4 MG SL tablet PLACE 1 TABLET UNDER THE TONGUE EVERY  5 (FIVE) MINUTES AS NEEDED FOR  CHEST PAIN (ANGINA). Patient taking differently: Place 0.4 mg under the tongue every 5 (five) minutes as needed for chest pain.  01/23/20  Yes Belva Crome, MD  pantoprazole (PROTONIX) 40 MG tablet TAKE 1 TABLET BY MOUTH EVERY DAY Patient taking differently: Take 40 mg by mouth daily.  10/23/19  Yes Belva Crome, MD  rosuvastatin (CRESTOR) 40 MG tablet Take 1 tablet (40 mg total) by mouth daily. 04/20/19 05/14/20 Yes Belva Crome, MD   No Known Allergies Review of Systems  Unable to perform ROS: Intubated    Physical Exam Vitals and nursing note reviewed.  Constitutional:      Appearance: He is ill-appearing.  Pulmonary:     Comments: Intubated    Vital Signs: BP 111/67   Pulse 94   Temp (!) 97 F (36.1 C) (Axillary)   Resp (!) 28   Ht 6' (1.829 m)   Wt 90.1 kg   SpO2 (!) 88%   BMI 26.94 kg/m  Pain Scale: 0-10 POSS *See Group Information*: S-Acceptable,Sleep, easy to arouse Pain Score: 0-No pain   SpO2: SpO2: (!) 88 % O2 Device:SpO2: (!) 88 % O2 Flow Rate: .O2 Flow Rate (L/min): 15 L/min  IO: Intake/output summary:   Intake/Output Summary (Last 24 hours) at 2020-05-07 1601 Last data filed at 2020-05-07 1400 Gross per 24 hour  Intake 4363.61 ml  Output 190 ml  Net 4173.61 ml    LBM: Last BM Date: May 07, 2020 Baseline Weight: Weight: 94.3 kg Most recent weight: Weight: 90.1 kg     Palliative Assessment/Data:PPS: 10%     Thank you for this consult. Palliative medicine will continue to follow and assist as needed.    Time In: 6742 Time Out: 1601 Time Total: 74 mins Greater than 50%  of this time was spent counseling and coordinating care related to the above assessment and plan.  Signed by: Mariana Kaufman, AGNP-C Palliative Medicine    Please contact Palliative Medicine Team phone at [phone] for questions and concerns.  For individual provider: See Shea Evans

## 2020-05-15 NOTE — Progress Notes (Signed)
STROKE TEAM PROGRESS NOTE   INTERVAL HISTORY Dr. Leonie Man spoke with wife over the phone. Wife upset she is unable to see patient d/t COVID status. Family has been to visit by looking through the window.  Patient appears more drowsy and less responsive today.  Renal function has worsened.  Vital signs are stable.  OBJECTIVE Vitals:   05-16-20 0500 05-16-20 0600 May 16, 2020 0752 16-May-2020 0800  BP: (!) 114/52 (!) 125/55  (!) 114/52  Pulse: 70 72  71  Resp: (!) 22 (!) 26  (!) 26  Temp:    (!) 97 F (36.1 C)  TempSrc:    Axillary  SpO2: 99% 99% 93% 93%  Weight:      Height:       CBC:  Recent Labs  Lab 04/18/20 0233 04/18/20 0233 04/16/2020 0024 04/28/2020 2151 04/23/20 0547 05-16-2020 0407  WBC 14.9*   < > 14.2*   < > 20.8* 25.6*  NEUTROABS 13.7*  --  12.9*  --   --   --   HGB 12.6*   < > 13.7   < > 10.8* 9.7*  HCT 38.3*   < > 42.8   < > 33.9* 30.8*  MCV 80.5   < > 80.8   < > 82.7 82.1  PLT 380   < > 397   < > 302 281   < > = values in this interval not displayed.   Basic Metabolic Panel:  Recent Labs  Lab 04/23/20 0547 04/23/20 0547 04/23/20 1751 May 16, 2020 0407  NA 146*  --   --  141  K 5.0  --   --  5.5*  CL 114*  --   --  110  CO2 19*  --   --  16*  GLUCOSE 295*  --   --  292*  BUN 173*  --   --  198*  CREATININE 5.86*  --   --  5.95*  CALCIUM 8.2*  --   --  7.6*  MG 3.0*   < > 2.9* 3.0*  PHOS 5.8*   < > 5.1* 4.6   < > = values in this interval not displayed.   IMAGING pst 24h No results found.   PHYSICAL EXAM     Blood pressure (!) 114/52, pulse 71, temperature (!) 97 F (36.1 C), temperature source Axillary, resp. rate (!) 26, height 6' (1.829 m), weight 90.1 kg, SpO2 93 %. CV: RRR, no M/R/G, left arm peripheral edema Elderly Caucasian male  Head is atraumatic,normocephalic,  Neurological Exam : Drowsy but able to open eyes with stimulation,speaks few words but nonfluent speechl. Can follow some simple commands on left side only., Conjugate gaze right preference  does not cross the midine to the left. Pupils sluggish and pin point.Blinks on the right not on the left.  .  The patient left the office before the visit was finished. Lower face weakness. Withdraws to stim slighlt right LE, no w/d to stil LUE.. Moves right UE and LE spontaneously and puposefully . Gait deferred.    ASSESSMENT/PLAN Mr. Raun Routh. is a 69 y.o. male with history of A. 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 Vaccinated but admitted with hypoxia from Covid on 10/31. On 03/19/20 he was found to be not moving his  left side. Not a tPA candidate due to Eliquis. Taken for thrombectomy. IR - Complete revascularization of RT PCA achieving a TICI 3 revascularization. S/P Balloon angioplasty of severe stenosis of Lt VBJ 60 to 70 % patency.  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  Also had severe stenosis of left vertebrobasilar junction status post balloon angioplasty    Code Stroke CT Head - Small acute cortical infarct of the distal Right ACA territory. No associated hemorrhage or mass effect. ASPECTS 9. Otherwise stable compared to September  CTA H&N - Occlusion right posterior cerebral artery which appears acute. Moderate stenosis distal right vertebral artery and severe stenosis distal left vertebral artery. Moderate stenosis proximal right vertebral artery origin and mild stenosis origin of left vertebral artery. 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  MRI head - Acute ischemic right ACA and  PCA territory infarcts as above. Associated petechial hemorrhage without hemorrhagic transformation or significant mass effect. Underlying age-related cerebral atrophy with moderate chronic small vessel ischemic disease.   MRA head -  Interval correction/resolution of previously seen proximal right PCA occlusion. Severe atheromatous disease seen throughout the underlying right P2 and distal right PCA branches. Extensive atheromatous disease involving the anterior cerebral arteries bilaterally, right worse than left, with severe multifocal right A2 and A3 stenoses. Moderate approximate 50% stenosis involving the cavernous left ICA. Moderate to severe bilateral V4 stenoses  2D Echo - EF 65 - 70%. No cardiac source of emboli identified. '  [place] Corona Virus 2 - positive (6 days ago)  Bilateral Lower Extremity Venous Dopplers - negative for DVT  EEG - This study is suggestive of moderate to severe diffuse encephalopathy, nonspecific etiology. No seizures or epileptiform discharges were seen throughout the recording.   LDL - 43  HgbA1c - 7.4  VTE prophylaxis - SCDs .   Eliquis (apixaban) daily prior to admission, now on IV heparin till he is able to swallow.  Will consider restarting Eliquis once po access. Continue IV heparin stroke protocol and continue low dose  Aspirin given intracranial angioplasty  Therapy recommendations:  SNF  Disposition:  Pending  Transition care to medical Hospitalists when he leaves ICU  Acute hypoxic/hypercapnic respiratory failure - multifactorial in settingCOVID-19 PNA now with stroke   Intubated for IR, not extubated following   Intubated  Extubated 04/22/20  Patient completed therapy with remdesivir and Tocilizumab  Remains on isolitation pos 10/31 (D39)  CCM on board  Atrial Fibrillation  Home anticoagulation:  Eliquis (apixaban) daily   HR controlled on amiodarone 200 daily  Eliquis on hold  On  IV heparin  Continue aspirin 81  Plan  resume Eliquis once po access/plan.    Hypertension  Home BP meds: Cozaar  Current BP meds: Norvasc 10   Stable . SBP goal below 180  . Long-term BP goal normotensive  Hyperlipidemia  Home Lipid lowering medication: Crestor 40 mg daily  LDL 43, goal < 70  Current lipid lowering medication: Crestor 40 mg daily  Continue statin at discharge  Diabetes type II, uncontrolled w/ hyperglycemia  Home diabetic meds: Tresiba 30 units QD, Metformin 1000 mg BID  Current diabetic meds: Novolog 6->10 units Q4H, Lantus 20->22 units BID, Novolog 0-15 units Q4H  HgbA1c 7.4, goal < 7.0 Recent Labs    04/23/20 1948 May 13, 2020 0008 05/13/20 0400  GLUCAP 258* 291*  [place] Coordinator following  Dysphagia . Secondary to stroke . Cortrak w/ tube feeds . 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   Added IVF.   Renal US no hydro, kidney dz.   If no improvement in labs, headed towards renal replacement.   CRRT 11/10>>  Nephrology on board  Unsure if he will be able to tolerated OP dialysis  Other Stroke Risk Factors  Advanced age  Former cigarette smoker - quit  Coronary artery disease s/p CABG   Congestive Heart Failure  Atrial fibrillation  Other Active Problems  Anemia - Hgb - 11.3->11.9->10.8->9.7  Leukocytosis - WBC's - 18.6->19.7->18.9->20.8->25.6  (afebrile) (steroids)   Diffuse moderate to severe cerebrovascular disease.  palliative care consulted for goals of care given severe stroke in setting of worsening renal failure.  Mental status worsening likely due to metabolic encephalopathy from worsening renal failure.  Discussed with wife over the phone who is struggling with making decisions about putting him on dialysis or not.  Willing to talk to palliative care.  Discussed with Dr. Carolin Sicks nephrology and Dr. Tacy Learn critical  care medicine.  I have asked Dr. Tacy Learn critical care medicine to assume care as primary attending and stroke team will follow. This patient is critically ill and at significant risk of neurological worsening, death and care requires constant monitoring of vital signs, hemodynamics,respiratory and cardiac monitoring, extensive review of multiple databases, frequent neurological assessment, discussion with family, other specialists and medical decision making of high complexity.I have made any additions or clarifications directly to the above note.This critical care time does not reflect procedure time, or teaching time or supervisory time of PA/NP/Med Resident etc but could involve care discussion time.  I spent 30 minutes of neurocritical care time  in the care of  this patient.    Hospital day # [place], MD  To contact Stroke Continuity provider, please refer to [email or web address] After hours, contact General Neurology

## 2020-05-15 NOTE — Progress Notes (Signed)
ANTICOAGULATION CONSULT NOTE  Pharmacy Consult for Heparin Indication: atrial fibrillation  No Known Allergies  Patient Measurements: Height: 6' (182.9 cm) Weight: 90.1 kg (198 lb 10.2 oz) IBW/kg (Calculated) : 77.6 Heparin Dosing Weight: 86 kg   Vital Signs: Temp: 97 F (36.1 C) (11/10 0800) Temp Source: Axillary (11/10 0800) BP: 111/67 (11/10 1509) Pulse Rate: 94 (11/10 1509)  Labs: Recent Labs    04/22/20 0544 04/22/20 0939 04/22/20 2016 04/23/20 0547 04/23/20 0547 04/23/20 0745 04/23/20 1751 April 27, 2020 0407 04-27-20 1431 04-27-2020 1505  HGB 11.3*   < >  --  10.8*   < >  --   --  9.7*  --  10.9*  HCT 35.8*   < >  --  33.9*  --   --   --  30.8*  --  32.0*  PLT 334  --   --  302  --   --   --  281  --   --   APTT  --   --  122*  --   --    < > 57* 64* 45*  --   HEPARINUNFRC  --    < > >2.20* >2.20*  --   --   --  2.18*  --   --   CREATININE 5.73*  --   --  5.86*  --   --   --  5.95*  --   --    < > = values in this interval not displayed.    Estimated Creatinine Clearance: 12.5 mL/min (A) (by C-G formula based on SCr of 5.95 mg/dL (H)).   Medical History: Past Medical History:  Diagnosis Date  . A-fib ([place])    with RVR  . Acute kidney injury ([place])   . Anemia   . Angina   . Arthritis    "hands" (08/10/2017)  . Atrial flutter, paroxysmal ([place])    arrived with aflutter with RVR, EF down to 25-30%, s/p TEE DCCV on 01/29/2015  . Cancer ([place])   . CHF (congestive heart failure) ([place])   . Chronic lower back pain    "all the time from my sciatic nerve"  . Coronary artery disease   . DVT (deep venous thrombosis) ([place])   . Dyspnea   . Erectile dysfunction   . Heart failure ([place])   . Hypercholesteremia   . Hypertension   . 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 Myocardial infarction ([place]) 1988; ~2536;  ~1996;~ 2000  . Obesity   . Peripheral vascular disease ([place])   . Umbilical hernia    unrepaired    Medications:  Medications Prior to  Admission  Medication Sig Dispense Refill Last Dose  . amiodarone (PACERONE) 200 MG tablet TAKE 1 TABLET BY MOUTH EVERY DAY (Patient taking differently: Take 200 mg by mouth daily. ) 90 tablet 3 Past Week at Unknown time  . gabapentin (NEURONTIN) 300 MG capsule Take 300 mg by mouth 2 (two) times daily.    Past Week at Unknown time  . insulin degludec (TRESIBA FLEXTOUCH) 100 UNIT/ML FlexTouch Pen Inject 30 Units into the skin daily.    Past Week at Unknown time  . losartan (COZAAR) 50 MG tablet Take 1 tablet (50 mg total) by mouth daily. 90 tablet 3 Past Week at Unknown time  . metFORMIN (GLUCOPHAGE) 1000 MG tablet Take 1,000 mg by mouth 2 (two) times daily with a meal.   Past Week at Unknown time  . nitroGLYCERIN (NITROSTAT) 0.4 MG SL  tablet PLACE 1 TABLET UNDER THE TONGUE EVERY 5 (FIVE) MINUTES AS NEEDED FOR CHEST PAIN (ANGINA). (Patient taking differently: Place 0.4 mg under the tongue every 5 (five) minutes as needed for chest pain. ) 25 tablet 6 unknown  . pantoprazole (PROTONIX) 40 MG tablet TAKE 1 TABLET BY MOUTH EVERY DAY (Patient taking differently: Take 40 mg by mouth daily. ) 90 tablet 1 Past Week at Unknown time  . rosuvastatin (CRESTOR) 40 MG tablet Take 1 tablet (40 mg total) by mouth daily. 90 tablet 3 Past Week at Unknown time    Assessment: 67 YOM who presented with acute R PCA occlusion s/p endovascular treatment. Patient was on Eliquis PTA for h/o Afib which is currently on hold. Pharmacy consulted to start IV heparin without bolus for secondary stroke prophylaxis in the setting of AFib.  APTT this evening is now subtherapeutic at 57 after rate decrease earlier today (aPTT high on heparin at 900 units/hr). Heparin level remains > 2.2, likely from AKI in the setting of outpatient Eliquis use. H/H down slightly today. Plt wnl. No bleeding or issues with infusion per discussion with RN.  11/10: Follow-up aPTT is subtherapeutic at 45 but falsely low - was drawn about an hour after the  heparin drip was stopped for intubation/line placement. Hgb stable, PLT WNL, no further signs of bleeding.   Goal of Therapy:  Heparin level 0.3-0.5 units/ml aPTT 66-85 seconds Monitor platelets by anticoagulation protocol: Yes   Plan:  Resume IV heparin at 950 units/hr  F/u 8-hr aPTT and HL at 0000 Monitor daily CBC and s/sx of bleeding   Mercy Riding, PharmD PGY1 Acute Care Pharmacy Resident Please refer to [facility] for unit-specific pharmacist

## 2020-05-15 NOTE — Progress Notes (Signed)
Pt was pronounced at [facility] by myself and Candace Cruise RN. Wife was at bedside with patient. No belongings present. Funeral home TBD  Hiram Gash RN

## 2020-05-15 NOTE — Progress Notes (Addendum)
[facility] NEPHROLOGY PROGRESS NOTE  Assessment/ Plan: 69 year old gentleman with history of DM, HTN, CAD, CABG, esophageal cancer, A. fib on anticoagulation, CHF, admitted with Covid pneumonia, respiratory failure, developed acute stroke and underwent IR revascularization.  He has CKD with recent creatinine level ranging between 2.3-2.9, we are consulted for worsening renal failure.  #Acute kidney injury on CKD4 due to contrast nephropathy and covid PNA causing ATN, nonoliguric.  UA with some proteinuria without RBC.the kidney ultrasound with chronicity without any hydronephrosis.  Started IV fluid without response.  Now his BUN is gone up to 198, creatinine level 5.95, potassium 5.5 and serum CO2 16.  Urine output is marginal. It is very hard to assess the mental status because of his recent stroke.  At this time he will need renal replacement therapy if family desires. I have called his wife and discussed about worsening renal failure and need for dialysis, CRRT today.  She wants to talk to her other family members before making the decision.  Palliative care consult is pending as well.  Given his stroke and multiple comorbidities he may not be able to tolerate outpatient dialysis.   #COVID-19 pneumonia with ventilator dependent respiratory failure: Now extubated and on high flow oxygen.  Per pulmonary team.  #Acute stroke status post IR revascularization: Stroke team is following.  On aspirin and statin.   #Mild hyponatremia: Serum sodium level has improved.  #Hyperkalemia: Order a dose of Lokelma.  Recommend to change tube feeds to Nepro.  #Metabolic acidosis: Plan to start CRRT soon.  If no decision make to start dialysis then he will need sodium bicarbonate.  #Permanent A. fib: On IV heparin and amiodarone.  Monitor heart rate.  Discussed with PCCM team.  Addendum 2:07 PM:  Discussed with ICU team Dr. Tacy Learn.  Patient declined clinically with hypotension and  respiratory stress required mechanical ventilation.  He again spoke with patient's wife who agreed to pursue mechanical ventilation and dialysis.  Dr. Tacy Learn already placed temporary HD catheter.  At this time I am going to start him on CRRT. 4K bath, already on IV heparin.  Subjective: Seen and examined ICU in Covid isolation.  Remains confused and not following commands.  He is on high flow oxygen.  Urine output only 450 cc.   Objective Vital signs in last 24 hours: Vitals:   2020-05-03 0500 03-May-2020 0600 05-03-20 0752 May 03, 2020 0800  BP: (!) 114/52 (!) 125/55  (!) 114/52  Pulse: 70 72  71  Resp: (!) 22 (!) 26  (!) 26  Temp:    (!) 97 F (36.1 C)  TempSrc:    Axillary  SpO2: 99% 99% 93% 93%  Weight:      Height:       Weight change: 3.7 kg  Intake/Output Summary (Last 24 hours) at May 03, 2020 0952 Last data filed at May 03, 2020 0800 Gross per 24 hour  Intake 5616.69 ml  Output 400 ml  Net 5216.69 ml       Labs: Basic Metabolic Panel: Recent Labs  Lab 04/22/20 0544 04/22/20 0544 04/22/20 0939 04/22/20 1811 04/23/20 0547 04/23/20 1751 May 03, 2020 0407  NA 143   < > 144  --  146*  --  141  K 4.7   < > 4.7  --  5.0  --  5.5*  CL 110  --   --   --  114*  --  110  CO2 20*  --   --   --  19*  --  16*  GLUCOSE 188*  --   --   --  295*  --  292*  BUN 144*  --   --   --  173*  --  198*  CREATININE 5.73*  --   --   --  5.86*  --  5.95*  CALCIUM 8.2*  --   --   --  8.2*  --  7.6*  PHOS  --   --   --    < > 5.8* 5.1* 4.6   < > = values in this interval not displayed.   Liver Function Tests: Recent Labs  Lab 04/22/20 0544 04/23/20 0547 25-Apr-2020 0407  AST 22 29 48*  ALT 8 10 22   ALKPHOS 79 72 84  BILITOT 0.4 0.5 0.9  PROT 5.0* 4.8* 4.4*  ALBUMIN 2.3* 2.2* 2.0*   No results for input(s): LIPASE, AMYLASE in the last 168 hours. No results for input(s): AMMONIA in the last 168 hours. CBC: Recent Labs  Lab 04/18/20 0233 04/18/20 0233 04/20/2020 0024 04/18/2020 2151  04/20/20 0434 04/20/20 0434 04/21/20 0555 04/21/20 1205 04/22/20 0544 04/22/20 0544 04/22/20 0939 04/23/20 0547 04/25/20 0407  WBC 14.9*   < > 14.2*  --  18.6*   < > 19.7*  --  18.9*  --   --  20.8* 25.6*  NEUTROABS 13.7*  --  12.9*  --   --   --   --   --   --   --   --   --   --   HGB 12.6*   < > 13.7   < > 10.0*   < > 11.8*   < > 11.3*   < > 11.9* 10.8* 9.7*  HCT 38.3*   < > 42.8   < > 32.9*   < > 36.3*   < > 35.8*   < > 35.0* 33.9* 30.8*  MCV 80.5   < > 80.8  --  85.7  --  80.8  --  81.2  --   --  82.7 82.1  PLT 380   < > 397  --  363   < > 361  --  334  --   --  302 281   < > = values in this interval not displayed.   Cardiac Enzymes: No results for input(s): CKTOTAL, CKMB, CKMBINDEX, TROPONINI in the last 168 hours. CBG: Recent Labs  Lab 04/23/20 1311 04/23/20 1531 04/23/20 1948 April 25, 2020 0008 04-25-2020 0400  GLUCAP 306* 303* 258* 291* 242*    Iron Studies: No results for input(s): IRON, TIBC, TRANSFERRIN, FERRITIN in the last 72 hours. Studies/Results: US RENAL  Result Date: 04/22/2020 CLINICAL DATA:  Acute kidney injury. EXAM: RENAL / URINARY TRACT ULTRASOUND COMPLETE COMPARISON:  None. FINDINGS: Right Kidney: Renal measurements: 12.2 x 5.2 x 5.1 cm = volume: 160 mL. There is no hydronephrosis. There is increased cortical echogenicity. Left Kidney: Renal measurements: 12.1 x 6.2 x 6.6 cm = volume: 257 mL. There is increased cortical echogenicity without evidence for hydronephrosis. Bladder: The bladder is decompressed with a Foley catheter and therefore is poorly evaluated. Other: None. IMPRESSION: 1. No hydronephrosis. 2. Echogenic kidneys bilaterally which can be seen in patients with medical renal disease. Electronically Signed   By: Constance Holster M.D.   On: 04/22/2020 19:19   DG Abd Portable 1V  Result Date: 04/22/2020 CLINICAL DATA:  Check gastric catheter placement EXAM: PORTABLE ABDOMEN - 1 VIEW COMPARISON:  04/20/2020 FINDINGS: Gastric catheter is been  removed in the  interval. Feeding catheter is now noted extending into the distal stomach. IMPRESSION: Feeding catheter in the distal stomach. Electronically Signed   By: Inez Catalina M.D.   On: 04/22/2020 14:20    Medications: Infusions:  sodium chloride     feeding supplement (JEVITY 1.2 CAL) 1,000 mL (04/23/20 1328)   heparin 950 Units/hr (22-May-2020 0800)    Scheduled Medications:  amiodarone  200 mg Per Tube Daily   amLODipine  10 mg Per Tube Daily   aspirin  81 mg Per Tube Daily   chlorhexidine  15 mL Mouth Rinse BID   Chlorhexidine Gluconate Cloth  6 each Topical Daily   docusate  100 mg Per Tube BID   feeding supplement (PROSource TF)  45 mL Per Tube Daily   free water  300 mL Per Tube Q6H   insulin aspart  0-15 Units Subcutaneous Q4H   insulin aspart  10 Units Subcutaneous Q4H   insulin glargine  30 Units Subcutaneous BID   Ipratropium-Albuterol  1 puff Inhalation BID   mouth rinse  15 mL Mouth Rinse q12n4p   [START ON 04/25/2020] methylPREDNISolone (SOLU-MEDROL) injection  30 mg Intravenous Daily   Followed by   Derrill Memo ON 04/26/2020] methylPREDNISolone (SOLU-MEDROL) injection  20 mg Intravenous Daily   Followed by   Derrill Memo ON 04/27/2020] methylPREDNISolone (SOLU-MEDROL) injection  10 mg Intravenous Daily   methylPREDNISolone (SOLU-MEDROL) injection  40 mg Intravenous Daily   pantoprazole sodium  40 mg Per Tube Daily   polyethylene glycol  17 g Per Tube Daily   rosuvastatin  40 mg Per Tube Daily    have reviewed scheduled and prn medications.  Physical Exam: General: Confused male lying on bed, on high flow oxygen Heart:RRR, s1s2 nl Lungs: Coarse breath sound bilateral Abdomen:soft, Non-tender, non-distended Extremities:No edema Skin: No ulcer or rash noted.  Sameer Teeple Prasad Samari Bittinger 2020/05/22,9:52 AM  LOS: 10 days  Pager: [phone]

## 2020-05-15 NOTE — Progress Notes (Addendum)
Patient became hypotensive and hypoxic.  Spoke with patient's wife and explained the situation, she decided to proceed with endotracheal intubation vasopressor infusion, central line placement and proceeding with CRRT initiation.  Patient was started on Levophed and then vasopressin was added.  He was intubated using RSI method.  Initially left leg intraosseous line was placed to improve his blood pressure prior to intubation, then central line was placed. Patient is in severe septic shock likely due to aspiration pneumonia, was started on IV Unasyn and stress dose steroid with hydrocortisone Patient was given 4 ampoules of sodium bicarbonate to improve acidosis and 2 mg of IV calcium gluconate to help stabilize cell membranes from hyperkalemia. Despite that patient condition is started getting worse he continued require maximum dose of vasopressors including Levophed and vasopressin, became hypoxic to 80s while being on FiO2 100% and PEEP of 12.  Patient family was contacted and his condition was updated that unfortunately he is actively dying. Patient wife is coming to see him at the moment and she made him DNR.   Additional critical care time spent 50 minutes excluding procedures.    Jacky Kindle MD Critical care physician [facility]  Pager: [phone] Mobile: [phone]

## 2020-05-15 NOTE — Progress Notes (Signed)
Inpatient Diabetes Program Recommendations  AACE/ADA: New Consensus Statement on Inpatient Glycemic Control (2015)  Target Ranges:  Prepandial:   less than 140 mg/dL      Peak postprandial:   less than 180 mg/dL (1-2 hours)      Critically ill patients:  140 - 180 mg/dL   Lab Results  Component Value Date   GLUCAP 242 (H) May 07, 2020   HGBA1C 7.4 (H) 04/20/2020    Review of Glycemic Control Results for GAVINN, COLLARD (MRN [id number]) as of 05-07-2020 11:09  Ref. Range 04/23/2020 13:11 04/23/2020 15:31 04/23/2020 19:48 05/07/20 00:08 07-May-2020 04:00  Glucose-Capillary Latest Ref Range: 70 - 99 mg/dL 306 (H) 303 (H) 258 (H) 291 (H) 242 (H)    Current orders for Inpatient glycemic control:  Lantus 30 units bid (increased today) Novolog 0-15 units q4h Novolog 10 units q4h  Inpatient Diabetes Program Recommendations:    Noted Lantus increased to 30 units bid today.  Please also consider, Novolog 12 units q4h tube feed coverage.  Stop if feeds are held or discontinued.  Will continue to follow while inpatient.  Thank you, Reche Dixon, RN, BSN Diabetes Coordinator Inpatient Diabetes Program [phone] (team pager from 8a-5p)

## 2020-05-15 NOTE — Procedures (Signed)
Central Venous Catheter Insertion Procedure Note  Justin Blackburn  [id number]  [birth year]  Date:02-May-2020  Time:4:24 PM   Provider Performing:Sheetal Lyall   Procedure: Insertion of Non-tunneled Central Venous [phone]) with US guidance (45625)   Indication(s) Medication administration  Consent Risks of the procedure as well as the alternatives and risks of each were explained to the patient and/or caregiver.  Consent for the procedure was obtained and is signed in the bedside chart  Anesthesia Topical only with 1% lidocaine   Timeout Verified patient identification, verified procedure, site/side was marked, verified correct patient position, special equipment/implants available, medications/allergies/relevant history reviewed, required imaging and test results available.  Sterile Technique Maximal sterile technique including full sterile barrier drape, hand hygiene, sterile gown, sterile gloves, mask, hair covering, sterile ultrasound probe cover (if used).  Procedure Description Area of catheter insertion was cleaned with chlorhexidine and draped in sterile fashion.  With real-time ultrasound guidance a central venous catheter was placed into the left internal jugular vein. Nonpulsatile blood flow and easy flushing noted in all ports.  The catheter was sutured in place and sterile dressing applied.  Complications/Tolerance None; patient tolerated the procedure well. Chest X-ray is ordered to verify placement for internal jugular or subclavian cannulation.   Chest x-ray is not ordered for femoral cannulation.  EBL Minimal  Specimen(s) None

## 2020-05-15 NOTE — Procedures (Signed)
Central Venous Catheter Insertion Procedure Note  Justin Blackburn  [id number]  [birth year]  Date:14-May-2020  Time:4:26 PM   Provider Performing:Zuleyma Scharf   Procedure: Insertion of Non-tunneled Central Venous Catheter([id number])with US guidance ([id number])    Indication(s) Hemodialysis  Consent Risks of the procedure as well as the alternatives and risks of each were explained to the patient and/or caregiver.  Consent for the procedure was obtained and is signed in the bedside chart  Anesthesia Topical only with 1% lidocaine   Timeout Verified patient identification, verified procedure, site/side was marked, verified correct patient position, special equipment/implants available, medications/allergies/relevant history reviewed, required imaging and test results available.  Sterile Technique Maximal sterile technique including full sterile barrier drape, hand hygiene, sterile gown, sterile gloves, mask, hair covering, sterile ultrasound probe cover (if used).  Procedure Description Area of catheter insertion was cleaned with chlorhexidine and draped in sterile fashion.   With real-time ultrasound guidance a HD catheter was placed into the right internal jugular vein.  Nonpulsatile blood flow and easy flushing noted in all ports.  The catheter was sutured in place and sterile dressing applied.  Complications/Tolerance None; patient tolerated the procedure well. Chest X-ray is ordered to verify placement for internal jugular or subclavian cannulation.  Chest x-ray is not ordered for femoral cannulation.  EBL Minimal  Specimen(s) None

## 2020-05-15 NOTE — Telephone Encounter (Signed)
Pt wife called in stated pt is in the hosp and would like to talk to dr Tamala Julian or his nurse. She would not provide any other info.    Best number [phone]

## 2020-05-15 NOTE — Progress Notes (Signed)
Spoke with infection prevention regarding infection status, due to patient being on HFNC at 15 L. COVID precautions need to stay in place until 11/22. Spoke with patient's son and explained the visitation situation.   Will continue to monitor  Hiram Gash RN

## 2020-05-15 DEATH — deceased

## 2020-06-17 ENCOUNTER — Ambulatory Visit: Payer: Medicare HMO | Admitting: Interventional Cardiology

## 2020-10-17 ENCOUNTER — Other Ambulatory Visit: Payer: Medicare HMO

## 2020-10-17 ENCOUNTER — Ambulatory Visit: Payer: Medicare HMO | Admitting: Oncology
